# Patient Record
Sex: Female | Born: 1970
Health system: Southern US, Community
[De-identification: ages and names within clinical notes are randomized; demographics above are authoritative.]

## PROBLEM LIST (undated history)

## (undated) ENCOUNTER — Emergency Department (HOSPITAL_COMMUNITY): Admission: EM | Payer: 59 | Source: Home / Self Care

## (undated) DIAGNOSIS — I509 Heart failure, unspecified: Secondary | ICD-10-CM

## (undated) DIAGNOSIS — F17200 Nicotine dependence, unspecified, uncomplicated: Secondary | ICD-10-CM

## (undated) DIAGNOSIS — B019 Varicella without complication: Secondary | ICD-10-CM

## (undated) DIAGNOSIS — R0600 Dyspnea, unspecified: Secondary | ICD-10-CM

## (undated) DIAGNOSIS — J449 Chronic obstructive pulmonary disease, unspecified: Secondary | ICD-10-CM

## (undated) DIAGNOSIS — K9403 Colostomy malfunction: Secondary | ICD-10-CM

## (undated) DIAGNOSIS — F1110 Opioid abuse, uncomplicated: Secondary | ICD-10-CM

## (undated) DIAGNOSIS — I10 Essential (primary) hypertension: Secondary | ICD-10-CM

## (undated) DIAGNOSIS — G4730 Sleep apnea, unspecified: Secondary | ICD-10-CM

## (undated) DIAGNOSIS — K219 Gastro-esophageal reflux disease without esophagitis: Secondary | ICD-10-CM

## (undated) DIAGNOSIS — R0902 Hypoxemia: Secondary | ICD-10-CM

## (undated) HISTORY — PX: ABDOMINAL HYSTERECTOMY: SHX81

## (undated) HISTORY — PX: HERNIA REPAIR: SHX51

## (undated) HISTORY — PX: LUMBAR SPINE SURGERY: SHX701

## (undated) HISTORY — PX: OVARIAN CYST REMOVAL: SHX89

## (undated) HISTORY — PX: INCISIONAL HERNIA REPAIR: SHX193

## (undated) HISTORY — PX: OTHER SURGICAL HISTORY: SHX169

## (undated) HISTORY — PX: BACK SURGERY: SHX140

---

## 1997-10-10 ENCOUNTER — Ambulatory Visit (HOSPITAL_COMMUNITY): Admission: RE | Admit: 1997-10-10 | Discharge: 1997-10-10 | Payer: Self-pay | Admitting: Obstetrics and Gynecology

## 1997-10-11 ENCOUNTER — Inpatient Hospital Stay (HOSPITAL_COMMUNITY): Admission: AD | Admit: 1997-10-11 | Discharge: 1997-10-11 | Payer: Self-pay | Admitting: *Deleted

## 1998-01-04 ENCOUNTER — Emergency Department (HOSPITAL_COMMUNITY): Admission: EM | Admit: 1998-01-04 | Discharge: 1998-01-04 | Payer: Self-pay | Admitting: Emergency Medicine

## 1998-01-04 ENCOUNTER — Encounter: Payer: Self-pay | Admitting: Emergency Medicine

## 1998-05-13 ENCOUNTER — Emergency Department (HOSPITAL_COMMUNITY): Admission: EM | Admit: 1998-05-13 | Discharge: 1998-05-13 | Payer: Self-pay | Admitting: Emergency Medicine

## 1999-02-13 ENCOUNTER — Emergency Department (HOSPITAL_COMMUNITY): Admission: EM | Admit: 1999-02-13 | Discharge: 1999-02-13 | Payer: Self-pay | Admitting: Emergency Medicine

## 1999-07-27 ENCOUNTER — Emergency Department (HOSPITAL_COMMUNITY): Admission: EM | Admit: 1999-07-27 | Discharge: 1999-07-27 | Payer: Self-pay | Admitting: Emergency Medicine

## 1999-07-27 ENCOUNTER — Encounter: Payer: Self-pay | Admitting: Emergency Medicine

## 2000-04-22 ENCOUNTER — Encounter: Payer: Self-pay | Admitting: Obstetrics and Gynecology

## 2000-04-22 ENCOUNTER — Ambulatory Visit (HOSPITAL_COMMUNITY): Admission: RE | Admit: 2000-04-22 | Discharge: 2000-04-22 | Payer: Self-pay | Admitting: Obstetrics and Gynecology

## 2000-05-02 ENCOUNTER — Encounter: Payer: Self-pay | Admitting: General Surgery

## 2000-05-02 ENCOUNTER — Encounter (INDEPENDENT_AMBULATORY_CARE_PROVIDER_SITE_OTHER): Payer: Self-pay | Admitting: Specialist

## 2000-05-03 ENCOUNTER — Inpatient Hospital Stay (HOSPITAL_COMMUNITY): Admission: RE | Admit: 2000-05-03 | Discharge: 2000-05-04 | Payer: Self-pay | Admitting: General Surgery

## 2000-10-06 ENCOUNTER — Emergency Department (HOSPITAL_COMMUNITY): Admission: EM | Admit: 2000-10-06 | Discharge: 2000-10-06 | Payer: Self-pay | Admitting: Emergency Medicine

## 2001-06-23 ENCOUNTER — Encounter: Payer: Self-pay | Admitting: Emergency Medicine

## 2001-06-23 ENCOUNTER — Encounter: Payer: Self-pay | Admitting: *Deleted

## 2001-06-23 ENCOUNTER — Emergency Department (HOSPITAL_COMMUNITY): Admission: EM | Admit: 2001-06-23 | Discharge: 2001-06-23 | Payer: Self-pay | Admitting: Emergency Medicine

## 2002-05-30 ENCOUNTER — Other Ambulatory Visit: Admission: RE | Admit: 2002-05-30 | Discharge: 2002-05-30 | Payer: Self-pay | Admitting: Obstetrics and Gynecology

## 2002-12-11 ENCOUNTER — Ambulatory Visit (HOSPITAL_COMMUNITY): Admission: RE | Admit: 2002-12-11 | Discharge: 2002-12-11 | Payer: Self-pay | Admitting: Family Medicine

## 2002-12-11 ENCOUNTER — Emergency Department (HOSPITAL_COMMUNITY): Admission: AD | Admit: 2002-12-11 | Discharge: 2002-12-11 | Payer: Self-pay | Admitting: Family Medicine

## 2003-11-14 ENCOUNTER — Emergency Department (HOSPITAL_COMMUNITY): Admission: EM | Admit: 2003-11-14 | Discharge: 2003-11-14 | Payer: Self-pay | Admitting: Emergency Medicine

## 2003-11-21 ENCOUNTER — Ambulatory Visit (HOSPITAL_COMMUNITY): Admission: RE | Admit: 2003-11-21 | Discharge: 2003-11-21 | Payer: Self-pay | Admitting: Emergency Medicine

## 2005-06-17 ENCOUNTER — Encounter: Admission: RE | Admit: 2005-06-17 | Discharge: 2005-06-17 | Payer: Self-pay | Admitting: Family Medicine

## 2005-11-05 ENCOUNTER — Emergency Department (HOSPITAL_COMMUNITY): Admission: EM | Admit: 2005-11-05 | Discharge: 2005-11-06 | Payer: Self-pay | Admitting: Emergency Medicine

## 2005-11-11 ENCOUNTER — Emergency Department (HOSPITAL_COMMUNITY): Admission: EM | Admit: 2005-11-11 | Discharge: 2005-11-11 | Payer: Self-pay | Admitting: Emergency Medicine

## 2005-12-08 ENCOUNTER — Other Ambulatory Visit: Payer: Self-pay

## 2005-12-14 ENCOUNTER — Inpatient Hospital Stay: Payer: Self-pay | Admitting: Unknown Physician Specialty

## 2005-12-20 ENCOUNTER — Inpatient Hospital Stay: Payer: Self-pay | Admitting: Obstetrics & Gynecology

## 2006-01-19 ENCOUNTER — Inpatient Hospital Stay: Payer: Self-pay | Admitting: Urology

## 2006-07-25 ENCOUNTER — Ambulatory Visit: Payer: Self-pay | Admitting: Gastroenterology

## 2007-06-05 ENCOUNTER — Ambulatory Visit (HOSPITAL_COMMUNITY): Admission: RE | Admit: 2007-06-05 | Discharge: 2007-06-05 | Payer: Self-pay | Admitting: Pulmonary Disease

## 2009-02-12 ENCOUNTER — Inpatient Hospital Stay: Payer: Self-pay | Admitting: Internal Medicine

## 2009-02-12 ENCOUNTER — Ambulatory Visit: Payer: Self-pay | Admitting: Internal Medicine

## 2009-09-03 ENCOUNTER — Emergency Department: Payer: Self-pay | Admitting: Emergency Medicine

## 2010-01-18 HISTORY — PX: LAPAROSCOPIC CHOLECYSTECTOMY: SUR755

## 2010-05-13 ENCOUNTER — Ambulatory Visit: Payer: Self-pay | Admitting: General Surgery

## 2010-06-01 ENCOUNTER — Ambulatory Visit: Payer: Self-pay | Admitting: General Surgery

## 2010-06-03 ENCOUNTER — Inpatient Hospital Stay: Payer: Self-pay | Admitting: General Surgery

## 2010-06-05 NOTE — Op Note (Signed)
[facility]  Patient:    Misty Green, Misty Green                    MRN: [id number] Proc. Date: 05/02/00 Adm. Date:  [id number] Disc. Date: [id number] Attending:  Autumn Messing S                           Operative Report  PREOPERATIVE DIAGNOSIS:   Symptomatic cholelithiasis.  POSTOPERATIVE DIAGNOSIS:  Symptomatic cholelithiasis.  OPERATION:  Laparoscopic cholecystectomy.  SURGEON:  Sammuel Hines. Daiva Nakayama, M.D.  ASSISTANT:  Ward Givens, M.D.  ANESTHESIA:  General endotracheal.  DESCRIPTION OF PROCEDURE:  After informed consent was obtained, the patient was brought to the operating room and placed in the supine position on the operating table.  After adequate induction of general anesthesia, the patients abdomen was prepped with Betadine and draped in the usual sterile fashion.  A small transverse supraumbilical incision was made with the 15 blade knife after infiltrating this area with 0.25% Marcaine.  This incision was carried down through the subcutaneous tissue bluntly using the Kelly clamp and [employer] retractors until the linea of alba was identified.  The linea alba was incised with the 15 blade knife and each side was grasped with Kocher clamps and elevated anteriorly.  The preperitoneal space was prepared bluntly until the peritoneum was opened and access was gained through the abdominal cavity.  A finger was inserted through this hole to palpate the abdominal wall.  No apparent adhesions were identified.  A 0 Vicryl pursestring stitch was then placed in the fascia around this hole.  A Hasson cannula was placed through this hole into the abdominal cavity and anchored in place with the previously placed Vicryl pursestring stitch.  The abdomen was insufflated with carbon dioxide and the laparoscope was inserted through the Hasson cannula. The liver edge and the gallbladder were readily identified.  An upper midline small transverse incision was  made after infiltrating this area with 0.25% Marcaine and a 10 mm port was placed through this incision bluntly into the abdominal cavity under direct vision.  Two places were chosen laterally on the right side of the abdomen below the costal margin for placement of the 5 mm ports, and these areas were infiltrated with 0.25% Marcaine.  Small incisions were made with the 15 blade knife and the 5 mm ports were placed bluntly through these incisions into the abdominal cavity under direct vision.  A blunt grasper was placed through the lateral most 5 mm port and used to grasp the dome of the gallbladder and elevate it anteriorly and superiorly.  Another 5 mm port was placed through the other 5 mm port and used to retract on the body and neck of the gallbladder.  A [place] suture was placed through midline port and the peritoneal reflection overlying the neck of the gallbladder was opened with the Bovie electrocautery as well as blunt dissection.  Blunt dissection was carried down in this area until the gallbladder neck and cystic neck junction was readily identified.  This junction was dissected circumferentially and care was taken to keep the common duct medial to the dissection.  Once the gallbladder neck and cystic duct junction had been identified and cleared, three clips were placed proximally and one distally in the cystic duct and the cystic duct was divided between the two.  The cystic artery was readily identified posterior to this and again it  was dissected in a circumferential manner with the [place].  Two clips were placed proximally and one distally on the cystic artery and the artery was divided between the two.  The laparoscopic hook cautery was then used to separate the gallbladder from the liver bed.  Prior to completely removing the gallbladder from the liver bed the liver bed was inspected and several small bleeding points were coagulated with cautery.  The  gallbladder was then removed the rest of the way from the liver bed with the hook electrocautery.  The laparoscope was then removed to the upper midline port and the endoscopic bag was placed through the Hasson cannula.  The gallbladder was placed within the laparoscopic bag under direct vision, and the bag was closed.  The bag was then removed with Hasson cannula through the supraumbilical port and the Hasson cannula was then replaced.  The abdomen was then inspected and the liver bed was evaluated and found to be hemostatic. The abdomen was then irrigated with copious amounts of saline until the effluent was clear.  The ports were then removed under direct vision and all were hemostatic.  The fascia of the supraumbilical port was closed with the previously placed Vicryl pursestring stitch.  The skin incisions were closed with 4-0 Monocryl subcuticular stitches.  Benzoin and Steri-Strips were applied.  The patient tolerated the procedure well.  At the end of the case, all needle sponge and instrument counts were correct.  The patient was awakened and taken to the recovery room in stable condition. DD:  05/06/00 TD:  05/08/00 Job: [id number]

## 2011-05-24 ENCOUNTER — Emergency Department: Payer: Self-pay | Admitting: Emergency Medicine

## 2011-06-25 ENCOUNTER — Other Ambulatory Visit (HOSPITAL_COMMUNITY): Payer: Self-pay | Admitting: Pulmonary Disease

## 2011-06-25 DIAGNOSIS — M545 Low back pain: Secondary | ICD-10-CM

## 2011-06-28 ENCOUNTER — Ambulatory Visit (HOSPITAL_COMMUNITY)
Admission: RE | Admit: 2011-06-28 | Discharge: 2011-06-28 | Disposition: A | Discharge status: Home / Self Care | Payer: 59 | Source: Ambulatory Visit | Attending: Pulmonary Disease | Admitting: Pulmonary Disease

## 2011-06-28 DIAGNOSIS — M545 Low back pain, unspecified: Secondary | ICD-10-CM | POA: Insufficient documentation

## 2011-06-28 DIAGNOSIS — M79609 Pain in unspecified limb: Secondary | ICD-10-CM | POA: Insufficient documentation

## 2011-06-28 DIAGNOSIS — M51379 Other intervertebral disc degeneration, lumbosacral region without mention of lumbar back pain or lower extremity pain: Secondary | ICD-10-CM | POA: Insufficient documentation

## 2011-06-28 DIAGNOSIS — M5126 Other intervertebral disc displacement, lumbar region: Secondary | ICD-10-CM | POA: Insufficient documentation

## 2011-06-28 DIAGNOSIS — M5137 Other intervertebral disc degeneration, lumbosacral region: Secondary | ICD-10-CM | POA: Insufficient documentation

## 2011-12-03 ENCOUNTER — Other Ambulatory Visit (HOSPITAL_COMMUNITY): Payer: Self-pay | Admitting: Pulmonary Disease

## 2011-12-03 DIAGNOSIS — J449 Chronic obstructive pulmonary disease, unspecified: Secondary | ICD-10-CM

## 2011-12-10 ENCOUNTER — Ambulatory Visit (HOSPITAL_COMMUNITY): Payer: 59

## 2011-12-22 ENCOUNTER — Ambulatory Visit (HOSPITAL_COMMUNITY)
Admission: RE | Admit: 2011-12-22 | Discharge: 2011-12-22 | Disposition: A | Discharge status: Home / Self Care | Payer: 59 | Source: Ambulatory Visit | Attending: Pulmonary Disease | Admitting: Pulmonary Disease

## 2011-12-22 ENCOUNTER — Other Ambulatory Visit (HOSPITAL_COMMUNITY): Payer: Self-pay | Admitting: Pulmonary Disease

## 2011-12-22 DIAGNOSIS — R0602 Shortness of breath: Secondary | ICD-10-CM

## 2011-12-22 DIAGNOSIS — J449 Chronic obstructive pulmonary disease, unspecified: Secondary | ICD-10-CM | POA: Insufficient documentation

## 2011-12-22 DIAGNOSIS — J4489 Other specified chronic obstructive pulmonary disease: Secondary | ICD-10-CM | POA: Insufficient documentation

## 2011-12-23 ENCOUNTER — Encounter (HOSPITAL_COMMUNITY): Payer: 59

## 2011-12-27 ENCOUNTER — Ambulatory Visit (HOSPITAL_COMMUNITY): Payer: 59

## 2011-12-28 ENCOUNTER — Ambulatory Visit (HOSPITAL_COMMUNITY)
Admission: RE | Admit: 2011-12-28 | Discharge: 2011-12-28 | Disposition: A | Discharge status: Home / Self Care | Payer: 59 | Source: Ambulatory Visit | Attending: Pulmonary Disease | Admitting: Pulmonary Disease

## 2011-12-28 DIAGNOSIS — J449 Chronic obstructive pulmonary disease, unspecified: Secondary | ICD-10-CM | POA: Insufficient documentation

## 2011-12-28 DIAGNOSIS — J4489 Other specified chronic obstructive pulmonary disease: Secondary | ICD-10-CM | POA: Insufficient documentation

## 2011-12-28 MED ORDER — ALBUTEROL SULFATE (5 MG/ML) 0.5% IN NEBU
2.5000 mg | INHALATION_SOLUTION | Freq: Once | RESPIRATORY_TRACT | Status: AC
  Administered 2011-12-28: 2.5 mg via RESPIRATORY_TRACT

## 2011-12-30 NOTE — Procedures (Signed)
NAME:  Misty Green, Misty Green             ACCOUNT NO.:  [id number]  MEDICAL RECORD NO.:  [id number]  LOCATION:  RESP                          FACILITY:  [facility]  PHYSICIAN:  Jantz Main L. Luan Pulling, M.D.DATE OF BIRTH:  [birth year]  DATE OF PROCEDURE: DATE OF DISCHARGE:  12/28/2011                           PULMONARY FUNCTION TEST   REASON FOR PULMONARY FUNCTION TESTING:  Chronic obstructive pulmonary disease.  1. Spirometry shows a moderate-to-severe ventilatory defect with     airflow obstruction. 2. Lung volumes show air trapping. 3. DLCO is normal. 4. Airway resistance is exceptionally high. 5. There is significant bronchodilator improvement. 6. This study is consistent with COPD.     Venba Zenner L. Luan Pulling, M.D.     ELH/MEDQ  D:  12/29/2011  T:  12/30/2011  Job:  [id number]

## 2012-02-03 LAB — PULMONARY FUNCTION TEST

## 2012-04-08 ENCOUNTER — Emergency Department: Payer: Self-pay | Admitting: Emergency Medicine

## 2012-04-10 ENCOUNTER — Emergency Department: Payer: Self-pay | Admitting: Emergency Medicine

## 2012-06-30 ENCOUNTER — Other Ambulatory Visit: Payer: Self-pay | Admitting: Neurosurgery

## 2012-06-30 DIAGNOSIS — M5126 Other intervertebral disc displacement, lumbar region: Secondary | ICD-10-CM

## 2012-07-05 ENCOUNTER — Other Ambulatory Visit: Payer: 59

## 2012-07-06 ENCOUNTER — Ambulatory Visit
Admission: RE | Admit: 2012-07-06 | Discharge: 2012-07-06 | Disposition: A | Discharge status: Home / Self Care | Payer: 59 | Source: Ambulatory Visit | Attending: Neurosurgery | Admitting: Neurosurgery

## 2012-07-06 DIAGNOSIS — M5126 Other intervertebral disc displacement, lumbar region: Secondary | ICD-10-CM

## 2012-08-20 ENCOUNTER — Emergency Department: Payer: Self-pay | Admitting: Emergency Medicine

## 2013-01-23 ENCOUNTER — Ambulatory Visit (HOSPITAL_COMMUNITY)
Admission: RE | Admit: 2013-01-23 | Discharge: 2013-01-23 | Disposition: A | Discharge status: Home / Self Care | Payer: 59 | Source: Ambulatory Visit | Attending: Pulmonary Disease | Admitting: Pulmonary Disease

## 2013-01-23 ENCOUNTER — Other Ambulatory Visit (HOSPITAL_COMMUNITY): Payer: Self-pay | Admitting: Pulmonary Disease

## 2013-01-23 DIAGNOSIS — R059 Cough, unspecified: Secondary | ICD-10-CM | POA: Insufficient documentation

## 2013-01-23 DIAGNOSIS — R0609 Other forms of dyspnea: Secondary | ICD-10-CM | POA: Insufficient documentation

## 2013-01-23 DIAGNOSIS — R05 Cough: Secondary | ICD-10-CM | POA: Insufficient documentation

## 2013-01-23 DIAGNOSIS — R509 Fever, unspecified: Secondary | ICD-10-CM | POA: Insufficient documentation

## 2013-01-23 DIAGNOSIS — Z87891 Personal history of nicotine dependence: Secondary | ICD-10-CM | POA: Insufficient documentation

## 2013-01-23 DIAGNOSIS — R0602 Shortness of breath: Secondary | ICD-10-CM

## 2013-01-23 DIAGNOSIS — R0989 Other specified symptoms and signs involving the circulatory and respiratory systems: Principal | ICD-10-CM | POA: Insufficient documentation

## 2013-12-04 ENCOUNTER — Other Ambulatory Visit: Payer: Self-pay | Admitting: Neurosurgery

## 2013-12-04 DIAGNOSIS — M5106 Intervertebral disc disorders with myelopathy, lumbar region: Secondary | ICD-10-CM

## 2014-01-21 ENCOUNTER — Other Ambulatory Visit: Payer: 59

## 2014-01-25 ENCOUNTER — Ambulatory Visit
Admission: RE | Admit: 2014-01-25 | Discharge: 2014-01-25 | Disposition: A | Discharge status: Home / Self Care | Payer: 59 | Source: Ambulatory Visit | Attending: Neurosurgery | Admitting: Neurosurgery

## 2014-01-25 DIAGNOSIS — M5106 Intervertebral disc disorders with myelopathy, lumbar region: Secondary | ICD-10-CM

## 2014-04-05 ENCOUNTER — Other Ambulatory Visit (HOSPITAL_COMMUNITY): Payer: Self-pay | Admitting: Pulmonary Disease

## 2014-04-05 DIAGNOSIS — M545 Low back pain: Secondary | ICD-10-CM

## 2014-04-16 ENCOUNTER — Ambulatory Visit (HOSPITAL_COMMUNITY)
Admission: RE | Admit: 2014-04-16 | Discharge: 2014-04-16 | Disposition: A | Discharge status: Home / Self Care | Payer: 59 | Source: Ambulatory Visit | Attending: Pulmonary Disease | Admitting: Pulmonary Disease

## 2014-04-16 DIAGNOSIS — M545 Low back pain: Secondary | ICD-10-CM

## 2015-08-21 ENCOUNTER — Encounter: Payer: Self-pay | Admitting: Emergency Medicine

## 2015-08-21 ENCOUNTER — Emergency Department
Admission: EM | Admit: 2015-08-21 | Discharge: 2015-08-21 | Disposition: A | Discharge status: Home / Self Care | Payer: 59 | Attending: Emergency Medicine | Admitting: Emergency Medicine

## 2015-08-21 ENCOUNTER — Emergency Department: Payer: 59

## 2015-08-21 DIAGNOSIS — S6991XA Unspecified injury of right wrist, hand and finger(s), initial encounter: Secondary | ICD-10-CM | POA: Diagnosis present

## 2015-08-21 DIAGNOSIS — Y929 Unspecified place or not applicable: Secondary | ICD-10-CM | POA: Diagnosis not present

## 2015-08-21 DIAGNOSIS — W010XXA Fall on same level from slipping, tripping and stumbling without subsequent striking against object, initial encounter: Secondary | ICD-10-CM | POA: Insufficient documentation

## 2015-08-21 DIAGNOSIS — S62102A Fracture of unspecified carpal bone, left wrist, initial encounter for closed fracture: Secondary | ICD-10-CM

## 2015-08-21 DIAGNOSIS — M25572 Pain in left ankle and joints of left foot: Secondary | ICD-10-CM | POA: Insufficient documentation

## 2015-08-21 DIAGNOSIS — Y9301 Activity, walking, marching and hiking: Secondary | ICD-10-CM | POA: Diagnosis not present

## 2015-08-21 DIAGNOSIS — F17210 Nicotine dependence, cigarettes, uncomplicated: Secondary | ICD-10-CM | POA: Diagnosis not present

## 2015-08-21 DIAGNOSIS — S52511A Displaced fracture of right radial styloid process, initial encounter for closed fracture: Secondary | ICD-10-CM | POA: Insufficient documentation

## 2015-08-21 DIAGNOSIS — Y999 Unspecified external cause status: Secondary | ICD-10-CM | POA: Diagnosis not present

## 2015-08-21 DIAGNOSIS — J449 Chronic obstructive pulmonary disease, unspecified: Secondary | ICD-10-CM | POA: Insufficient documentation

## 2015-08-21 HISTORY — DX: Chronic obstructive pulmonary disease, unspecified: J44.9

## 2015-08-21 MED ORDER — HYDROMORPHONE HCL 1 MG/ML IJ SOLN
1.0000 mg | Freq: Once | INTRAMUSCULAR | Status: AC
  Administered 2015-08-21: 1 mg via INTRAVENOUS
  Filled 2015-08-21: qty 1

## 2015-08-21 MED ORDER — PROMETHAZINE HCL 25 MG PO TABS
25.0000 mg | ORAL_TABLET | Freq: Four times a day (QID) | ORAL | 0 refills | Status: DC | PRN
Start: 2015-08-21 — End: 2017-12-13

## 2015-08-21 MED ORDER — BUPIVACAINE HCL (PF) 0.5 % IJ SOLN
INTRAMUSCULAR | Status: AC
  Administered 2015-08-21: 150 mg
  Filled 2015-08-21: qty 30

## 2015-08-21 MED ORDER — HYDROMORPHONE HCL 1 MG/ML IJ SOLN
1.0000 mg | Freq: Once | INTRAMUSCULAR | Status: AC
Start: 2015-08-21 — End: 2015-08-21
  Administered 2015-08-21: 1 mg via INTRAMUSCULAR
  Filled 2015-08-21: qty 1

## 2015-08-21 MED ORDER — LIDOCAINE HCL (PF) 1 % IJ SOLN
INTRAMUSCULAR | Status: AC
  Administered 2015-08-21: 5 mL
  Filled 2015-08-21: qty 5

## 2015-08-21 MED ORDER — OXYCODONE-ACETAMINOPHEN 10-325 MG PO TABS: 1.0000 | ORAL_TABLET | Freq: Four times a day (QID) | ORAL | 0 refills | Status: DC | PRN

## 2015-08-21 MED ORDER — ONDANSETRON 4 MG PO TBDP
4.0000 mg | ORAL_TABLET | Freq: Once | ORAL | Status: AC
  Administered 2015-08-21: 4 mg via ORAL
  Filled 2015-08-21: qty 1

## 2015-08-21 MED ORDER — LIDOCAINE-EPINEPHRINE (PF) 1 %-1:200000 IJ SOLN
INTRAMUSCULAR | Status: AC
  Administered 2015-08-21: 30 mL
  Filled 2015-08-21: qty 30

## 2015-08-21 NOTE — Consult Note (Signed)
ORTHOPAEDIC CONSULTATION  REQUESTING PHYSICIAN: Rudene Re, MD  Chief Complaint:   Right wrist pain.  History of Present Illness: Misty Green is a 45 y.o. female with a history of COPD who lives independently with her husband. Apparently, her dogs woke up and wanted to be walked around 3:00 this morning. As she took the dogs out, she tripped and fell onto her outstretched right hand. She noted significant pain in the wrist, prompting her to present to the emergency room for further evaluation and treatment. X-rays in the emergency room demonstrated a dorsally impacted fracture. Initially, she was to be splinted and sent home for follow-up with orthopedics. However, because of significant pain, orthopedic consultation was arranged. The patient denies any numbness or paresthesias to her fingers and denies any prior injury to the wrist. She denies any loss of consciousness, but does note that she may have sprained her left ankle as a result of the fall. X-rays of her left ankle were unremarkable in the emergency room.  Past Medical History:  Diagnosis Date  . COPD (chronic obstructive pulmonary disease) ([place])    Past Surgical History:  Procedure Laterality Date  . ABDOMINAL HYSTERECTOMY    . BACK SURGERY    . cesction    . HERNIA REPAIR     Social History   Social History  . Marital status: Married    Spouse name: N/A  . Number of children: N/A  . Years of education: N/A   Social History Main Topics  . Smoking status: Current Every Day Smoker    Packs/day: 1.00    Types: Cigarettes  . Smokeless tobacco: Never Used  . Alcohol use No  . Drug use: No  . Sexual activity: Not Asked   Other Topics Concern  . None   Social History Narrative  . None   No family history on file. Allergies no known allergies Prior to Admission medications   Medication Sig Start Date End Date Taking? Authorizing Provider   oxyCODONE-acetaminophen (PERCOCET) 10-325 MG tablet Take 1 tablet by mouth every 6 (six) hours as needed for pain. 08/21/15 08/20/16  Pierce Crane Beers, PA-C  promethazine (PHENERGAN) 25 MG tablet Take 1 tablet (25 mg total) by mouth every 6 (six) hours as needed for nausea or vomiting. 08/21/15   Arlyss Repress, PA-C   Dg Wrist Complete Right  Result Date: 08/21/2015 CLINICAL DATA:  Right wrist pain and swelling after falling over a railroad tie last night. EXAM: RIGHT WRIST - COMPLETE 3+ VIEW COMPARISON:  None. FINDINGS: Comminuted and mildly impacted transverse fracture of the distal radial metaphysis with dorsal angulation and mild dorsal displacement of the distal fragment. There is also a fracture through the base of the ulnar styloid with mild radial displacement of the distal fragment. Diffuse soft tissue swelling. IMPRESSION: Distal radius and ulnar styloid fractures, as described above. Electronically Signed   By: Claudie Revering M.D.   On: 08/21/2015 17:39   Dg Ankle Complete Left  Result Date: 08/21/2015 CLINICAL DATA:  [place] last night over a railroad tie, LEFT ankle pain EXAM: LEFT ANKLE COMPLETE - 3+ VIEW COMPARISON:  None FINDINGS: Osseous demineralization. Soft tissue swelling greatest laterally. Ankle mortise intact. Non fused ossicle at tip of lateral malleolus, appears corticated/old. No definite acute fracture, dislocation, or bone destruction. IMPRESSION: No definite acute osseous abnormalities. Electronically Signed   By: Lavonia Dana M.D.   On: 08/21/2015 17:40    Positive ROS: All other systems have been reviewed and were  otherwise negative with the exception of those mentioned in the HPI and as above.  Physical Exam: General:  Alert, no acute distress Psychiatric:  Patient is competent for consent with normal mood and affect   Cardiovascular:  No pedal edema Respiratory:  No wheezing, non-labored breathing GI:  Abdomen is soft and non-tender Skin:  No lesions in the area of chief  complaint Neurologic:  Sensation intact distally Lymphatic:  No axillary or cervical lymphadenopathy  Orthopedic Exam:  Orthopedic examination is limited to the right wrist and hand. There is a noticeable deformity of the distal radius with moderate swelling. She has moderate to severe pain to palpation in this area, as well as with any attempted active or passive motion of the wrist. She is able to actively flex and extend all digits, although motion is limited secondary to pain and apprehension. She is neurovascular intact to all digits.  X-rays:  AP, lateral, and oblique views of the right wrist are available for review. These films demonstrate an impacted and mildly dorsally angulated distal radius fracture with intra-articular extension and an ulnar styloid fracture. No lytic lesions or other bony abnormalities are identified.  Assessment: Impacted and dorsally angulated right distal radius fracture with an ulnar styloid fracture.  Plan: The treatment options were discussed with the patient and her husband. Given the age of the patient and activity level, that patient would like to consider surgical intervention to include open reduction and internal fixation of the fracture with plate fixation. This will be arranged for next week. Meanwhile, in order to help with her pain, she is offered and accepts a hematoma block/wrist block. This is performed sterilely using 10 cc of 0.5% Sensorcaine and 10 cc of 1% lidocaine with epinephrine. 10 cc of this solution was injected into the wrist joint and the other 10 cc injected into the fracture hematoma. The patient tolerated the procedure well and notes significant improvement in her pain. A sugar tong splint will be applied by the ER provider.  The patient is advised to keep her hand elevated above heart level to keep the splint dry and intact. She may take ibuprofen and/or Tylenol as needed for pain. She is to be given a prescription by the ER provider  for Percocet to take for more severe pain. She is to call our office in the morning to arrange a follow-up appointment for next Monday with either myself or with Cameron Proud, my physician assistant.  Thank you for ask me to participate in the care of this most pleasant unfortunate woman. I will be happy to keep you abreast of her progress.   Pascal Lux, MD  Beeper #:  [phone]  08/21/2015 9:04 PM

## 2015-08-21 NOTE — ED Notes (Addendum)
Pt O2 saturation low, placed on 2 L of oxygen.

## 2015-08-21 NOTE — ED Notes (Signed)
Patient states took Oxycodone 15 mg one hour PTA.

## 2015-08-21 NOTE — ED Provider Notes (Signed)
[facility] Emergency Department Provider Note  ____________________________________________  Time seen: Approximately 5:35 PM  I have reviewed the triage vital signs and the nursing notes.   HISTORY  Chief Complaint Fall    HPI Misty Green is a 45 y.o. female who presents for evaluation of right wrist and left ankle pain status post fall. Patient states she was walking her dogs at 3 this morning approximately 16 hours ago when she tripped and fell landing on an outstretched arm. The pain is 10 over 10 at this time.   Past Medical History:  Diagnosis Date  . COPD (chronic obstructive pulmonary disease) ([facility])     There are no active problems to display for this patient.   Past Surgical History:  Procedure Laterality Date  . ABDOMINAL HYSTERECTOMY    . BACK SURGERY    . cesction    . HERNIA REPAIR      Prior to Admission medications   Medication Sig Start Date End Date Taking? Authorizing Provider  oxyCODONE-acetaminophen (PERCOCET) 10-325 MG tablet Take 1 tablet by mouth every 6 (six) hours as needed for pain. 08/21/15 08/20/16  Pierce Crane Jaden Batchelder, PA-C  promethazine (PHENERGAN) 25 MG tablet Take 1 tablet (25 mg total) by mouth every 6 (six) hours as needed for nausea or vomiting. 08/21/15   Arlyss Repress, PA-C    Allergies Review of patient's allergies indicates no known allergies.  No family history on file.  Social History Social History  Substance Use Topics  . Smoking status: Current Every Day Smoker    Packs/day: 1.00    Types: Cigarettes  . Smokeless tobacco: Never Used  . Alcohol use No    Review of Systems Constitutional: No fever/chills Eyes: No visual changes. ENT: No sore throat. Cardiovascular: Denies chest pain. Respiratory: Denies shortness of breath. Gastrointestinal: No abdominal pain.  No nausea, no vomiting.  No diarrhea.  No constipation. Genitourinary: Negative for dysuria. Musculoskeletal: Positive for right  wrist pain. Positive for left ankle pain. Skin: Negative for rash. Neurological: Negative for headaches, focal weakness or numbness.  10-point ROS otherwise negative.  ____________________________________________   PHYSICAL EXAM:  VITAL SIGNS: ED Triage Vitals  Enc Vitals Group     BP 08/21/15 1658 133/71     Pulse Rate 08/21/15 1658 80     Resp 08/21/15 1658 16     Temp 08/21/15 1658 97.8 F (36.6 C)     Temp Source 08/21/15 1658 Oral     SpO2 08/21/15 1658 (!) 83 %     Weight 08/21/15 1658 155 lb (70.3 kg)     Height 08/21/15 1658 5\' 4"  (1.626 m)     Head Circumference --      Peak Flow --      Pain Score 08/21/15 1700 10     Pain Loc --      Pain Edu? --      Excl. in [place]? --     Constitutional: Alert and oriented. Well appearing and in no acute distress. Neck: No stridor.   Cardiovascular: Normal rate, regular rhythm. Grossly normal heart sounds.  Good peripheral circulation. Respiratory: Normal respiratory effort.  No retractions. Lungs CTAB. Musculoskeletal: Left ankle limited range of motion. Increased pain with flexion. Distally neurovascularly intact. Positive lateral and medial swelling noted. Right wrist with limited range of motion positive capillary refill. Unable to flex wrist obvious deformity noted. Capillary refill positive. Neurologic:  Normal speech and language. No gross focal neurologic deficits are appreciated. No  gait instability. Skin:  Skin is warm, dry and intact. No rash noted. Psychiatric: Mood and affect are normal. Speech and behavior are normal.  ____________________________________________   LABS (all labs ordered are listed, but only abnormal results are displayed)  Labs Reviewed - No data to display ____________________________________________  EKG   ____________________________________________  RADIOLOGY  FINDINGS: Comminuted and mildly impacted transverse fracture of the distal radial metaphysis with dorsal angulation and  mild dorsal displacement of the distal fragment. There is also a fracture through the base of the ulnar styloid with mild radial displacement of the distal fragment. Diffuse soft tissue swelling.  IMPRESSION: Distal radius and ulnar styloid fractures, as described above. ____________________________________________   PROCEDURES  Procedure(s) performed: None  Critical Care performed: No  ____________________________________________   INITIAL IMPRESSION / ASSESSMENT AND PLAN / ED COURSE  Pertinent labs & imaging results that were available during my care of the patient were reviewed by me and considered in my medical decision making (see chart for details).  Comminuted and impacted left wrist fracture.  Clinical Course   Is given 2 mg Dilaudid still increased pain. Dr. Roland Rack placed a heme block, patient was placed in sugar tong splint and instructed to follow-up with him next week. Rx given for Percocet 10/325 #30. ____________________________________________   FINAL CLINICAL IMPRESSION(S) / ED DIAGNOSES  Final diagnoses:  Left wrist fracture, closed, initial encounter     This chart was dictated using voice recognition software/Dragon. Despite best efforts to proofread, errors can occur which can change the meaning. Any change was purely unintentional.    Arlyss Repress, PA-C 08/21/15 [place], MD 08/21/15 [phone]

## 2015-08-21 NOTE — ED Triage Notes (Signed)
Fell, twisted left ankle and injured right wrist while walking dogs.

## 2015-08-21 NOTE — Discharge Instructions (Signed)
Keep splint dry and intact. Keep hand elevated above heart level. Apply ice to affected area frequently. May take extra strength Tylenol and/or ibuprofen as needed for pain. A prescription for Percocet has been provided for more severe pain. Return for follow-up on Monday with either Dr. Roland Rack or Cameron Proud, PA-C.  Call [phone] on Friday morning to arrange this appointment.

## 2015-08-25 ENCOUNTER — Encounter: Payer: Self-pay | Admitting: Anesthesiology

## 2015-08-25 ENCOUNTER — Ambulatory Visit: Payer: 59 | Admitting: Anesthesiology

## 2015-08-25 ENCOUNTER — Ambulatory Visit
Admission: AD | Admit: 2015-08-25 | Discharge: 2015-08-25 | Disposition: A | Discharge status: Home / Self Care | Payer: 59 | Source: Ambulatory Visit | Attending: Surgery | Admitting: Surgery

## 2015-08-25 ENCOUNTER — Encounter
Admission: AD | Disposition: A | Discharge status: Home / Self Care | Payer: Self-pay | Source: Ambulatory Visit | Attending: Surgery

## 2015-08-25 DIAGNOSIS — G4730 Sleep apnea, unspecified: Secondary | ICD-10-CM | POA: Diagnosis not present

## 2015-08-25 DIAGNOSIS — S52501A Unspecified fracture of the lower end of right radius, initial encounter for closed fracture: Secondary | ICD-10-CM | POA: Diagnosis present

## 2015-08-25 DIAGNOSIS — Y93K1 Activity, walking an animal: Secondary | ICD-10-CM | POA: Insufficient documentation

## 2015-08-25 DIAGNOSIS — Y9289 Other specified places as the place of occurrence of the external cause: Secondary | ICD-10-CM | POA: Diagnosis not present

## 2015-08-25 DIAGNOSIS — S52611A Displaced fracture of right ulna styloid process, initial encounter for closed fracture: Secondary | ICD-10-CM | POA: Insufficient documentation

## 2015-08-25 DIAGNOSIS — Z90710 Acquired absence of both cervix and uterus: Secondary | ICD-10-CM | POA: Insufficient documentation

## 2015-08-25 DIAGNOSIS — W010XXA Fall on same level from slipping, tripping and stumbling without subsequent striking against object, initial encounter: Secondary | ICD-10-CM | POA: Diagnosis not present

## 2015-08-25 DIAGNOSIS — S52614A Nondisplaced fracture of right ulna styloid process, initial encounter for closed fracture: Secondary | ICD-10-CM | POA: Insufficient documentation

## 2015-08-25 DIAGNOSIS — S52571A Other intraarticular fracture of lower end of right radius, initial encounter for closed fracture: Secondary | ICD-10-CM | POA: Insufficient documentation

## 2015-08-25 DIAGNOSIS — F17210 Nicotine dependence, cigarettes, uncomplicated: Secondary | ICD-10-CM | POA: Insufficient documentation

## 2015-08-25 DIAGNOSIS — J449 Chronic obstructive pulmonary disease, unspecified: Secondary | ICD-10-CM | POA: Diagnosis not present

## 2015-08-25 HISTORY — PX: ORIF WRIST FRACTURE: SHX2133

## 2015-08-25 SURGERY — OPEN REDUCTION INTERNAL FIXATION (ORIF) WRIST FRACTURE
Anesthesia: General | Laterality: Right

## 2015-08-25 MED ORDER — ONDANSETRON HCL 4 MG/2ML IJ SOLN
INTRAMUSCULAR | Status: DC | PRN
  Administered 2015-08-25: 4 mg via INTRAVENOUS

## 2015-08-25 MED ORDER — FENTANYL CITRATE (PF) 100 MCG/2ML IJ SOLN
INTRAMUSCULAR | Status: AC
  Filled 2015-08-25: qty 2

## 2015-08-25 MED ORDER — KETOROLAC TROMETHAMINE 30 MG/ML IJ SOLN
30.0000 mg | Freq: Once | INTRAMUSCULAR | Status: AC
  Administered 2015-08-25: 30 mg via INTRAVENOUS
  Filled 2015-08-25: qty 1

## 2015-08-25 MED ORDER — HYDROMORPHONE HCL 1 MG/ML IJ SOLN
INTRAMUSCULAR | Status: AC
  Filled 2015-08-25: qty 1

## 2015-08-25 MED ORDER — LACTATED RINGERS IV SOLN
INTRAVENOUS | Status: DC
  Administered 2015-08-25 (×2): via INTRAVENOUS

## 2015-08-25 MED ORDER — OXYCODONE HCL 5 MG PO TABS
ORAL_TABLET | ORAL | Status: AC
  Filled 2015-08-25: qty 1

## 2015-08-25 MED ORDER — DEXAMETHASONE SODIUM PHOSPHATE 10 MG/ML IJ SOLN
INTRAMUSCULAR | Status: DC | PRN
  Administered 2015-08-25: 10 mg via INTRAVENOUS

## 2015-08-25 MED ORDER — IPRATROPIUM-ALBUTEROL 0.5-2.5 (3) MG/3ML IN SOLN
3.0000 mL | Freq: Once | RESPIRATORY_TRACT | Status: AC
  Administered 2015-08-25: 3 mL via RESPIRATORY_TRACT

## 2015-08-25 MED ORDER — FENTANYL CITRATE (PF) 100 MCG/2ML IJ SOLN
INTRAMUSCULAR | Status: DC | PRN
  Administered 2015-08-25 (×3): 50 ug via INTRAVENOUS
  Administered 2015-08-25 (×2): 25 ug via INTRAVENOUS
  Administered 2015-08-25 (×3): 50 ug via INTRAVENOUS
  Administered 2015-08-25: 100 ug via INTRAVENOUS
  Administered 2015-08-25 (×2): 50 ug via INTRAVENOUS

## 2015-08-25 MED ORDER — MIDAZOLAM HCL 2 MG/2ML IJ SOLN
INTRAMUSCULAR | Status: DC | PRN
  Administered 2015-08-25: 2 mg via INTRAVENOUS

## 2015-08-25 MED ORDER — NEOMYCIN-POLYMYXIN B GU 40-200000 IR SOLN
Status: AC
  Filled 2015-08-25: qty 2

## 2015-08-25 MED ORDER — NEOMYCIN-POLYMYXIN B GU 40-200000 IR SOLN
Status: DC | PRN
  Administered 2015-08-25: 2 mL

## 2015-08-25 MED ORDER — KETOROLAC TROMETHAMINE 30 MG/ML IJ SOLN
INTRAMUSCULAR | Status: AC
  Filled 2015-08-25: qty 1

## 2015-08-25 MED ORDER — ACETAMINOPHEN 10 MG/ML IV SOLN
INTRAVENOUS | Status: DC | PRN
  Administered 2015-08-25: 1000 mg via INTRAVENOUS

## 2015-08-25 MED ORDER — BUPIVACAINE HCL (PF) 0.5 % IJ SOLN
INTRAMUSCULAR | Status: AC
  Filled 2015-08-25: qty 30

## 2015-08-25 MED ORDER — IPRATROPIUM-ALBUTEROL 0.5-2.5 (3) MG/3ML IN SOLN
RESPIRATORY_TRACT | Status: AC
  Administered 2015-08-25: 3 mL via RESPIRATORY_TRACT
  Filled 2015-08-25: qty 3

## 2015-08-25 MED ORDER — ESMOLOL HCL 100 MG/10ML IV SOLN
INTRAVENOUS | Status: DC | PRN
  Administered 2015-08-25: 10 mg via INTRAVENOUS

## 2015-08-25 MED ORDER — BUPIVACAINE HCL (PF) 0.5 % IJ SOLN
INTRAMUSCULAR | Status: DC | PRN
  Administered 2015-08-25: 10 mL

## 2015-08-25 MED ORDER — OXYCODONE HCL 5 MG/5ML PO SOLN: 5.0000 mg | Freq: Once | ORAL | Status: AC | PRN

## 2015-08-25 MED ORDER — FENTANYL CITRATE (PF) 100 MCG/2ML IJ SOLN
INTRAMUSCULAR | Status: AC
  Administered 2015-08-25: 25 ug via INTRAVENOUS
  Filled 2015-08-25: qty 2

## 2015-08-25 MED ORDER — FENTANYL CITRATE (PF) 100 MCG/2ML IJ SOLN
INTRAMUSCULAR | Status: AC
  Administered 2015-08-25: 100 ug via INTRAVENOUS
  Filled 2015-08-25: qty 2

## 2015-08-25 MED ORDER — FENTANYL CITRATE (PF) 100 MCG/2ML IJ SOLN
25.0000 ug | INTRAMUSCULAR | Status: DC | PRN
  Administered 2015-08-25 (×4): 25 ug via INTRAVENOUS
  Administered 2015-08-25: 50 ug via INTRAVENOUS

## 2015-08-25 MED ORDER — PROPOFOL 10 MG/ML IV BOLUS
INTRAVENOUS | Status: DC | PRN
  Administered 2015-08-25: 30 mg via INTRAVENOUS
  Administered 2015-08-25: 170 mg via INTRAVENOUS

## 2015-08-25 MED ORDER — CEFAZOLIN SODIUM-DEXTROSE 2-4 GM/100ML-% IV SOLN
2.0000 g | Freq: Once | INTRAVENOUS | Status: AC
  Administered 2015-08-25: 2 g via INTRAVENOUS

## 2015-08-25 MED ORDER — FENTANYL CITRATE (PF) 100 MCG/2ML IJ SOLN
100.0000 ug | Freq: Once | INTRAMUSCULAR | Status: AC
  Administered 2015-08-25: 100 ug via INTRAVENOUS

## 2015-08-25 MED ORDER — ACETAMINOPHEN 10 MG/ML IV SOLN
INTRAVENOUS | Status: AC
  Filled 2015-08-25: qty 100

## 2015-08-25 MED ORDER — CEFAZOLIN SODIUM-DEXTROSE 2-4 GM/100ML-% IV SOLN
INTRAVENOUS | Status: DC
Start: 2015-08-25 — End: 2015-08-25
  Filled 2015-08-25: qty 100

## 2015-08-25 MED ORDER — OXYCODONE HCL 15 MG PO TABS: 15.0000 mg | ORAL_TABLET | ORAL | 0 refills | Status: DC | PRN

## 2015-08-25 MED ORDER — OXYCODONE HCL 5 MG PO TABS
5.0000 mg | ORAL_TABLET | Freq: Once | ORAL | Status: AC | PRN
  Administered 2015-08-25: 5 mg via ORAL

## 2015-08-25 MED ORDER — HYDROMORPHONE HCL 1 MG/ML IJ SOLN
0.2500 mg | INTRAMUSCULAR | Status: DC | PRN
  Administered 2015-08-25 (×4): 0.5 mg via INTRAVENOUS

## 2015-08-25 SURGICAL SUPPLY — 67 items
BIT DRILL 2.2 SS TIBIAL (BIT) ×4 IMPLANT
BNDG COHESIVE 4X5 TAN STRL (GAUZE/BANDAGES/DRESSINGS) ×3 IMPLANT
BNDG ESMARK 4X12 TAN STRL LF (GAUZE/BANDAGES/DRESSINGS) ×3 IMPLANT
CANISTER SUCT 1200ML W/VALVE (MISCELLANEOUS) ×3 IMPLANT
CHLORAPREP W/TINT 26ML (MISCELLANEOUS) ×4 IMPLANT
CLOSURE WOUND 1/2 X4 (GAUZE/BANDAGES/DRESSINGS) ×1
CORD BIP STRL DISP 12FT (MISCELLANEOUS) ×3 IMPLANT
CUFF TOURN 18 STER (MISCELLANEOUS) ×2 IMPLANT
DRAPE FLUOR MINI C-ARM 54X84 (DRAPES) ×3 IMPLANT
DRAPE IMP U-DRAPE 54X76 (DRAPES) ×3 IMPLANT
DRAPE SURG 17X11 SM STRL (DRAPES) ×3 IMPLANT
DRAPE U-SHAPE 47X51 STRL (DRAPES) ×1 IMPLANT
ELECT CAUTERY BLADE 6.4 (BLADE) ×3 IMPLANT
ELECT REM PT RETURN 9FT ADLT (ELECTROSURGICAL) ×3
ELECTRODE REM PT RTRN 9FT ADLT (ELECTROSURGICAL) ×1 IMPLANT
FORCEPS JEWEL BIP 4-3/4 STR (INSTRUMENTS) ×3 IMPLANT
GAUZE PETRO XEROFOAM 1X8 (MISCELLANEOUS) ×3 IMPLANT
GAUZE SPONGE 4X4 12PLY STRL (GAUZE/BANDAGES/DRESSINGS) ×3 IMPLANT
GLOVE BIO SURGEON STRL SZ 6.5 (GLOVE) ×2 IMPLANT
GLOVE BIO SURGEON STRL SZ8 (GLOVE) ×3 IMPLANT
GLOVE BIO SURGEONS STRL SZ 6.5 (GLOVE) ×2
GLOVE BIOGEL PI IND STRL 7.0 (GLOVE) IMPLANT
GLOVE BIOGEL PI INDICATOR 7.0 (GLOVE) ×4
GLOVE INDICATOR 8.0 STRL GRN (GLOVE) ×3 IMPLANT
GOWN STRL REUS W/ TWL LRG LVL3 (GOWN DISPOSABLE) ×1 IMPLANT
GOWN STRL REUS W/ TWL XL LVL3 (GOWN DISPOSABLE) ×1 IMPLANT
GOWN STRL REUS W/TWL LRG LVL3 (GOWN DISPOSABLE) ×6
GOWN STRL REUS W/TWL XL LVL3 (GOWN DISPOSABLE) ×3
K-WIRE 1.6 (WIRE) ×12
K-WIRE FX5X1.6XNS BN SS (WIRE) ×4
KIT RM TURNOVER STRD PROC AR (KITS) ×3 IMPLANT
KWIRE FX5X1.6XNS BN SS (WIRE) IMPLANT
NDL FILTER BLUNT 18X1 1/2 (NEEDLE) ×1 IMPLANT
NEEDLE FILTER BLUNT 18X 1/2SAF (NEEDLE) ×2
NEEDLE FILTER BLUNT 18X1 1/2 (NEEDLE) ×1 IMPLANT
NS IRRIG 500ML POUR BTL (IV SOLUTION) ×3 IMPLANT
PACK EXTREMITY ARMC (MISCELLANEOUS) ×3 IMPLANT
PAD CAST CTTN 4X4 STRL (SOFTGOODS) ×2 IMPLANT
PADDING CAST COTTON 4X4 STRL (SOFTGOODS) ×6
PEG LOCKING SMOOTH 2.2X15 (Peg) ×2 IMPLANT
PEG LOCKING SMOOTH 2.2X16 (Screw) ×4 IMPLANT
PEG LOCKING SMOOTH 2.2X18 (Peg) ×2 IMPLANT
PEG LOCKING SMOOTH 2.2X20 (Screw) ×2 IMPLANT
PEG LOCKING SMOOTH 2.2X22 (Screw) ×2 IMPLANT
PLATE CROSSLOCK MINI RT (Plate) ×2 IMPLANT
SCREW  LP NL 2.7X13MM (Screw) ×2 IMPLANT
SCREW 2.7X14MM (Screw) ×2 IMPLANT
SCREW BN 14X2.7XNONLOCK 3 LD (Screw) IMPLANT
SCREW LOCK 14X2.7X 3 LD TPR (Screw) IMPLANT
SCREW LOCK 18X2.7X 3 LD TPR (Screw) IMPLANT
SCREW LOCKING 2.7X14 (Screw) ×6 IMPLANT
SCREW LOCKING 2.7X18 (Screw) ×3 IMPLANT
SCREW LOCKING 2.7X8MM (Screw) ×2 IMPLANT
SCREW LP NL 2.7X13MM (Screw) IMPLANT
SCREW LP NL 2.7X22MM (Screw) ×4 IMPLANT
SCREW NLOCK 2.7X14 (Screw) ×1 IMPLANT
SCREW NLOCK 24X2.7 3 LD (Screw) IMPLANT
SCREW NONLOCK 2.7X24 (Screw) ×3 IMPLANT
STAPLER SKIN PROX 35W (STAPLE) ×3 IMPLANT
STOCKINETTE IMPERVIOUS 9X36 MD (GAUZE/BANDAGES/DRESSINGS) ×3 IMPLANT
STRIP CLOSURE SKIN 1/2X4 (GAUZE/BANDAGES/DRESSINGS) ×1 IMPLANT
SUT PROLENE 4 0 PS 2 18 (SUTURE) ×3 IMPLANT
SUT VIC AB 2-0 SH 27 (SUTURE) ×6
SUT VIC AB 2-0 SH 27XBRD (SUTURE) ×1 IMPLANT
SUT VIC AB 3-0 SH 27 (SUTURE) ×3
SUT VIC AB 3-0 SH 27X BRD (SUTURE) ×1 IMPLANT
SYRINGE 10CC LL (SYRINGE) ×3 IMPLANT

## 2015-08-25 NOTE — Progress Notes (Signed)
Spoke with Dr. Roland Rack via phone informing of pt's uncontrolled pain rating 10/10 despite meds given (see MAR).  O2 sats from 85-90 with 3 L O2 via N/C.  Dr. Roland Rack advised to continue monitoring pt.  Did not want to admit for obs tonight.  Pt is resting at times with eyes closed. Tearful at times.  Will continue to monitor.

## 2015-08-25 NOTE — OR Nursing (Signed)
Last bruise on right hip upon arrival to unit.

## 2015-08-25 NOTE — Op Note (Signed)
Pre-Op Diagnosis:   Closed acute 3-part intra-articular distal radius fracture with ulnar styloid fracture, right wrist.  Post-Op Diagnosis:   Same.  Procedure:   Open reduction and internal fixation of three-part intra-articular right distal radius fracture.  Surgeon:   Pascal Lux, MD  Assistant:   Delma Post, PA-S  Anesthesia:   General LMA  Findings:   As above.  Complications:   None  EBL:   20 cc  Fluids:   1100 cc crystalloid  TT:   126 minutes at 250 mmHg  Drains:   None  Closure:   3-0 Vicryl subcuticular sutures  Implants:   Biomet DVR Cross-locked precontoured distal radius mini-locking plate.  Brief Clinical Note:   The patient is a 45 year old female who sustained the above-noted injury 4 days ago when she tripped on the edge of her sidewalk and fell onto her outstretched right hand while walking her dog. She presented emergency room where x-rays demonstrated the above-noted injury. She was splinted and referred to orthopedics. She presents at this time for definitive management of her injury.  Procedure:   The patient was brought into the operating room and lain in the supine position. After adequate general laryngal mask anesthesia was obtained, a timeout was performed to verify the appropriate surgical site. The patient's right hand and upper extremity were prepped with ChloraPrep solution before being draped sterilely. Preoperative antibiotics were administered. The limb was exsanguinated with an Esmarch and the tourniquet inflated to 250 mmHg. An approximately 7-8 cm incision was made over the volar aspect of the distal radius beginning at the volar flexion crease and extending proximally along the flexor carpi radialis tendon. The incision was carried down through the subcutaneous tissues to expose the superficial retinaculum. This was split the length of the incision directly over the flexor carpi radialis tendon. The FCR sheath was opened and the tendon  retracted ulnarly to protect the median nerve. The floor of the FCR sheath was opened to expose the pronator quadratus muscle. This was released along the radial insertion and the muscle was retracted ulnarly to expose the distal radius. The fracture was identified and soft tissues elevated off the distal metaphyseal region for several centimeters. The appropriate sized plate was selected and positioned on the distal radius. A guidewire was placed through the distal hole and its position verified using FluoroScan imaging in AP and lateral projections. After several attempts, the plate appeared to be centered appropriately on the distal radius. The radial styloid was carefully reduced and temporarily secured using a K wire. The distal portion of the plate was secured using several locking pins and nonlocking screws. After placing these pins and screws, it was noted that the plate had somehow shifted and was no longer centered on the distal radius. Therefore, pins and screws were removed and the plate repositioned this time it was held with two K-wires distally and a third K-wire proximally. Again the adequacy of plate position was verified fluoroscopically in AP and lateral projections and found to be excellent. The 7 distal holes were secured using locking pins or nonlocking screws as deemed appropriate. The adequacy of hardware position and fracture reduction was verified using FluoroScan imaging in AP, lateral, and several additional oblique projections to be sure that the hardware did not enter the joint nor did it penetrate dorsally. Proximally, a single bicortical nonlocking screw was placed proximally to pull the plate gently down toward the shaft. This screw was later removed and replaced with a shorter  nonlocking cortical screw of appropriate length. Two additional locking cortical screws were placed to secure the plate to the metaphyseal region proximally. Again the construct was assessed using FluoroScan  imaging in AP, lateral, and oblique projections and found to be excellent.  The wound was copiously irrigated with sterile saline solution before the pronator quadratus was reapproximated using 2-0 Vicryl interrupted sutures. The subcutaneous tissues were closed using 2-0 Vicryl interrupted sutures before the subcuticular layer was closed using 3-0 Vicryl inverted interrupted sutures. Benzoin and Steri-Strips are applied to the skin. A total of 10 cc of 0.5% plain Sensorcaine was injected in and around the incision site to help with postoperative analgesia before a sterile bulky dressing was applied to the wound. The patient was placed into a volar splint maintaining the wrist in neutral position before the patient was awakened, extubated, and returned to the recovery room in satisfactory condition after tolerating the procedure well.

## 2015-08-25 NOTE — Progress Notes (Signed)
Pt O2 sats are betwee 85 and 90 with 2 L O2.  Pt is holding breath.  Explained pt to breath through nose and not to hold breath.  Unable to give pain medication with O2 sats low.  Providing emotional support to pt. Dr. Randa Lynn at bedside and advised to give PO pain meds.  Gave crackers and fluid to drink in order to give PO meds.  Pt lying in bed quietly.  RN at bedside.

## 2015-08-25 NOTE — Anesthesia Postprocedure Evaluation (Signed)
Anesthesia Post Note  Patient: Misty Green  Procedure(s) Performed: Procedure(s) (LRB): OPEN REDUCTION INTERNAL FIXATION (ORIF) WRIST FRACTURE (Right)  Patient location during evaluation: PACU Anesthesia Type: General Level of consciousness: awake and alert and oriented Pain management: pain level controlled Vital Signs Assessment: post-procedure vital signs reviewed and stable Respiratory status: spontaneous breathing, nonlabored ventilation, respiratory function stable and patient connected to nasal cannula oxygen Cardiovascular status: blood pressure returned to baseline and stable Postop Assessment: no signs of nausea or vomiting Anesthetic complications: no Comments: Discussed with patient and husband the importance of use of CPAP overnight tonight    Last Vitals:  Vitals:   08/25/15 2011 08/25/15 2031  BP:  132/75  Pulse: 79 77  Resp: 11   Temp:      Last Pain:  Vitals:   08/25/15 2031  TempSrc:   PainSc: 9                  Misty Green

## 2015-08-25 NOTE — H&P (Signed)
Paper H&P to be scanned into permanent record. H&P reviewed. No changes. 

## 2015-08-25 NOTE — Progress Notes (Signed)
Dr. Randa Lynn at bedside to observe pt.  [place] aware of pt's O2 sats hx.  Encouraged pt to wear cpap tonight.  Pt and husband stated understanding and agreed.  Pt AO, NAD.  D/C to home

## 2015-08-25 NOTE — Transfer of Care (Signed)
Immediate Anesthesia Transfer of Care Note  Patient: Misty Green  Procedure(s) Performed: Procedure(s): OPEN REDUCTION INTERNAL FIXATION (ORIF) WRIST FRACTURE (Right)  Patient Location: PACU  Anesthesia Type:General  Level of Consciousness: awake and patient cooperative  Airway & Oxygen Therapy: Patient Spontanous Breathing and Patient connected to face mask oxygen  Post-op Assessment: Report given to RN and Post -op Vital signs reviewed and stable  Post vital signs: Reviewed and stable  Last Vitals:  Vitals:   08/25/15 1403  BP: 130/73  Pulse: 86  Resp: 20  Temp: 36.8 C    Last Pain:  Vitals:   08/25/15 1456  TempSrc:   PainSc: 8          Complications: No apparent anesthesia complications

## 2015-08-25 NOTE — Discharge Instructions (Addendum)
AMBULATORY SURGERY  DISCHARGE INSTRUCTIONS   1) The drugs that you were given will stay in your system until tomorrow so for the next 24 hours you should not:  A) Drive an automobile B) Make any legal decisions C) Drink any alcoholic beverage   2) You may resume regular meals tomorrow.  Today it is better to start with liquids and gradually work up to solid foods.  You may eat anything you prefer, but it is better to start with liquids, then soup and crackers, and gradually work up to solid foods.   3) Please notify your doctor immediately if you have any unusual bleeding, trouble breathing, redness and pain at the surgery site, drainage, fever, or pain not relieved by medication.    4) Additional Instructions:        Please contact your physician with any problems or Same Day Surgery at [phone], Monday through Friday 6 am to 4 pm, or [place] at [facility] number at [phone].Keep splint dry and intact. Keep hand elevated above heart level. Apply ice to affected area frequently. Return for follow-up in 10-14 days or as scheduled.

## 2015-08-25 NOTE — Anesthesia Preprocedure Evaluation (Signed)
Anesthesia Evaluation  Patient identified by MRN, date of birth, ID band Patient awake    Reviewed: Allergy & Precautions, H&P , NPO status , Patient's Chart, lab work & pertinent test results  History of Anesthesia Complications Negative for: history of anesthetic complications  Airway Mallampati: II  TM Distance: >3 FB Neck ROM: full    Dental  (+) Poor Dentition, Chipped   Pulmonary sleep apnea, Continuous Positive Airway Pressure Ventilation and Oxygen sleep apnea , COPD, Current Smoker,    Pulmonary exam normal breath sounds clear to auscultation       Cardiovascular Exercise Tolerance: Good (-) angina(-) Past MI and (-) DOE Normal cardiovascular exam Rhythm:regular Rate:Normal     Neuro/Psych negative neurological ROS  negative psych ROS   GI/Hepatic negative GI ROS, Neg liver ROS, neg GERD  ,  Endo/Other  negative endocrine ROS  Renal/GU negative Renal ROS  negative genitourinary   Musculoskeletal   Abdominal   Peds  Hematology negative hematology ROS (+)   Anesthesia Other Findings Past Medical History: No date: COPD (chronic obstructive pulmonary disease) (*  Past Surgical History: No date: ABDOMINAL HYSTERECTOMY No date: BACK SURGERY No date: cesction No date: HERNIA REPAIR  BMI    Body Mass Index:  26.61 kg/m      Reproductive/Obstetrics negative OB ROS                             Anesthesia Physical Anesthesia Plan  ASA: III  Anesthesia Plan: General LMA   Post-op Pain Management:    Induction:   Airway Management Planned:   Additional Equipment:   Intra-op Plan:   Post-operative Plan:   Informed Consent: I have reviewed the patients History and Physical, chart, labs and discussed the procedure including the risks, benefits and alternatives for the proposed anesthesia with the patient or authorized representative who has indicated his/her understanding  and acceptance.   Dental Advisory Given  Plan Discussed with: Anesthesiologist, CRNA and Surgeon  Anesthesia Plan Comments:         Anesthesia Quick Evaluation

## 2015-08-25 NOTE — Anesthesia Procedure Notes (Signed)
Procedure Name: LMA Insertion Date/Time: 08/25/2015 3:37 PM Performed by: Jonna Clark Pre-anesthesia Checklist: Patient identified, Patient being monitored, Timeout performed, Emergency Drugs available and Suction available Patient Re-evaluated:Patient Re-evaluated prior to inductionOxygen Delivery Method: Circle system utilized Preoxygenation: Pre-oxygenation with 100% oxygen Intubation Type: IV induction Ventilation: Mask ventilation without difficulty LMA: LMA inserted LMA Size: 3.5 Tube type: Oral Number of attempts: 1 Placement Confirmation: positive ETCO2 and breath sounds checked- equal and bilateral Tube secured with: Tape Dental Injury: Teeth and Oropharynx as per pre-operative assessment

## 2015-08-26 ENCOUNTER — Encounter: Payer: Self-pay | Admitting: Surgery

## 2015-12-15 ENCOUNTER — Ambulatory Visit (INDEPENDENT_AMBULATORY_CARE_PROVIDER_SITE_OTHER): Payer: 59 | Admitting: Otolaryngology

## 2016-02-25 DIAGNOSIS — M545 Low back pain: Secondary | ICD-10-CM | POA: Diagnosis not present

## 2016-02-25 DIAGNOSIS — J449 Chronic obstructive pulmonary disease, unspecified: Secondary | ICD-10-CM | POA: Diagnosis not present

## 2016-03-03 DIAGNOSIS — M5416 Radiculopathy, lumbar region: Secondary | ICD-10-CM | POA: Diagnosis not present

## 2016-03-17 DIAGNOSIS — G4733 Obstructive sleep apnea (adult) (pediatric): Secondary | ICD-10-CM | POA: Diagnosis not present

## 2016-03-21 ENCOUNTER — Encounter (HOSPITAL_COMMUNITY): Payer: Self-pay

## 2016-03-21 ENCOUNTER — Inpatient Hospital Stay (HOSPITAL_COMMUNITY)
Admission: EM | Admit: 2016-03-21 | Discharge: 2016-03-24 | DRG: 189 | Disposition: A | Discharge status: Home / Self Care | Payer: 59 | Attending: Internal Medicine | Admitting: Internal Medicine

## 2016-03-21 ENCOUNTER — Emergency Department (HOSPITAL_COMMUNITY): Payer: 59

## 2016-03-21 DIAGNOSIS — R0602 Shortness of breath: Secondary | ICD-10-CM | POA: Diagnosis not present

## 2016-03-21 DIAGNOSIS — J9602 Acute respiratory failure with hypercapnia: Secondary | ICD-10-CM

## 2016-03-21 DIAGNOSIS — G4700 Insomnia, unspecified: Secondary | ICD-10-CM | POA: Diagnosis present

## 2016-03-21 DIAGNOSIS — F1129 Opioid dependence with unspecified opioid-induced disorder: Secondary | ICD-10-CM | POA: Diagnosis not present

## 2016-03-21 DIAGNOSIS — G8929 Other chronic pain: Secondary | ICD-10-CM | POA: Diagnosis present

## 2016-03-21 DIAGNOSIS — Z9989 Dependence on other enabling machines and devices: Secondary | ICD-10-CM

## 2016-03-21 DIAGNOSIS — Z9889 Other specified postprocedural states: Secondary | ICD-10-CM

## 2016-03-21 DIAGNOSIS — M549 Dorsalgia, unspecified: Secondary | ICD-10-CM | POA: Diagnosis present

## 2016-03-21 DIAGNOSIS — J9600 Acute respiratory failure, unspecified whether with hypoxia or hypercapnia: Secondary | ICD-10-CM | POA: Diagnosis present

## 2016-03-21 DIAGNOSIS — Z90710 Acquired absence of both cervix and uterus: Secondary | ICD-10-CM

## 2016-03-21 DIAGNOSIS — G894 Chronic pain syndrome: Secondary | ICD-10-CM | POA: Diagnosis present

## 2016-03-21 DIAGNOSIS — D751 Secondary polycythemia: Secondary | ICD-10-CM | POA: Diagnosis not present

## 2016-03-21 DIAGNOSIS — J9601 Acute respiratory failure with hypoxia: Principal | ICD-10-CM | POA: Diagnosis present

## 2016-03-21 DIAGNOSIS — G4733 Obstructive sleep apnea (adult) (pediatric): Secondary | ICD-10-CM | POA: Diagnosis present

## 2016-03-21 DIAGNOSIS — J441 Chronic obstructive pulmonary disease with (acute) exacerbation: Secondary | ICD-10-CM | POA: Diagnosis not present

## 2016-03-21 DIAGNOSIS — F419 Anxiety disorder, unspecified: Secondary | ICD-10-CM | POA: Diagnosis present

## 2016-03-21 DIAGNOSIS — Z79899 Other long term (current) drug therapy: Secondary | ICD-10-CM

## 2016-03-21 DIAGNOSIS — F17210 Nicotine dependence, cigarettes, uncomplicated: Secondary | ICD-10-CM | POA: Diagnosis present

## 2016-03-21 DIAGNOSIS — F1120 Opioid dependence, uncomplicated: Secondary | ICD-10-CM | POA: Diagnosis present

## 2016-03-21 DIAGNOSIS — Z7951 Long term (current) use of inhaled steroids: Secondary | ICD-10-CM

## 2016-03-21 HISTORY — DX: Nicotine dependence, unspecified, uncomplicated: F17.200

## 2016-03-21 LAB — CBC WITH DIFFERENTIAL/PLATELET
Basophils Absolute: 0 10*3/uL (ref 0.0–0.1)
Basophils Relative: 0 %
EOS ABS: 0.1 10*3/uL (ref 0.0–0.7)
EOS PCT: 1 %
HCT: 59.1 % — ABNORMAL HIGH (ref 36.0–46.0)
Hemoglobin: 18.9 g/dL — ABNORMAL HIGH (ref 12.0–15.0)
LYMPHS ABS: 1.7 10*3/uL (ref 0.7–4.0)
Lymphocytes Relative: 20 %
MCH: 33.2 pg (ref 26.0–34.0)
MCHC: 32 g/dL (ref 30.0–36.0)
MCV: 103.7 fL — ABNORMAL HIGH (ref 78.0–100.0)
MONO ABS: 0.8 10*3/uL (ref 0.1–1.0)
Monocytes Relative: 9 %
Neutro Abs: 5.9 10*3/uL (ref 1.7–7.7)
Neutrophils Relative %: 69 %
PLATELETS: 166 10*3/uL (ref 150–400)
RBC: 5.7 MIL/uL — ABNORMAL HIGH (ref 3.87–5.11)
RDW: 13.7 % (ref 11.5–15.5)
WBC: 8.5 10*3/uL (ref 4.0–10.5)

## 2016-03-21 LAB — BASIC METABOLIC PANEL
Anion gap: 6 (ref 5–15)
BUN: 8 mg/dL (ref 6–20)
CALCIUM: 9.2 mg/dL (ref 8.9–10.3)
CO2: 34 mmol/L — ABNORMAL HIGH (ref 22–32)
CREATININE: 0.54 mg/dL (ref 0.44–1.00)
Chloride: 98 mmol/L — ABNORMAL LOW (ref 101–111)
GFR calc Af Amer: >60 mL/min (ref >60)
GLUCOSE: 109 mg/dL — AB (ref 65–99)
Potassium: 3.9 mmol/L (ref 3.5–5.1)
SODIUM: 138 mmol/L (ref 135–145)

## 2016-03-21 MED ORDER — IPRATROPIUM BROMIDE 0.02 % IN SOLN
0.5000 mg | Freq: Once | RESPIRATORY_TRACT | Status: AC
  Administered 2016-03-21: 0.5 mg via RESPIRATORY_TRACT
  Filled 2016-03-21: qty 2.5

## 2016-03-21 MED ORDER — SODIUM CHLORIDE 0.9 % IV SOLN
INTRAVENOUS | Status: DC
  Administered 2016-03-21: 21:00:00 via INTRAVENOUS

## 2016-03-21 MED ORDER — METHYLPREDNISOLONE SODIUM SUCC 125 MG IJ SOLR
125.0000 mg | Freq: Once | INTRAMUSCULAR | Status: AC
  Administered 2016-03-21: 125 mg via INTRAVENOUS
  Filled 2016-03-21: qty 2

## 2016-03-21 MED ORDER — ALBUTEROL (5 MG/ML) CONTINUOUS INHALATION SOLN
10.0000 mg/h | INHALATION_SOLUTION | Freq: Once | RESPIRATORY_TRACT | Status: AC
  Administered 2016-03-21: 10 mg/h via RESPIRATORY_TRACT
  Filled 2016-03-21: qty 20

## 2016-03-21 NOTE — ED Triage Notes (Signed)
Pt has been on narcotics for 4  Years.  Pt feels she is addicted.  Pain pills aren't working.  Pt states she feels like she is going crazy d/t pain and inability to control.  Pt also has decreased oxygen sats at 75% on RA.  Chronic cough.  Pt has COPD and is a smoker.

## 2016-03-21 NOTE — ED Notes (Signed)
Visitor comes out and tells me that pt is in pain and needs pain medication.  Advised them that I would come in and check on pt shortly, he tells me that she is here due to wanting to get off of pain medication, advised that generally no pain medication is given to patients coming in wishing to get help getting off of pain pills.

## 2016-03-21 NOTE — ED Notes (Signed)
Pt is tearful, states that her back pain is awful, states that she feels no relief from her pain medication.  No weakness or numbness with back pain.  Pain in back has been chronic but increasing since last year.  Pt with hx of CPOD-current smoker.  Home O2 2.5L at night.  Pt is tearful, speaking in full sentences.  86% on 2L, increased O2 to 3L [place].  Pt spo2 is 90% on 3L.  Pt is tearful due to the back pain.  Sitting on the side of the bed.

## 2016-03-21 NOTE — ED Notes (Signed)
Pt had to go to bathroom.  Ambulated there off o2 after breathing treatment.  Pt 77% on RA after walking to bathroom.  Pt states that she feels weak.  She is back on 3L [place].  EDP at the bedside and aware.

## 2016-03-21 NOTE — ED Provider Notes (Signed)
[facility] DEPT Provider Note   CSN: [id number] Arrival date & time: 03/21/16  1812     History   Chief Complaint Chief Complaint  Patient presents with  . Drug Problem  . Shortness of Breath    HPI Misty Green is a 46 y.o. female.  46 year old female with history of COPD presents short of breath with cough. Also complains of worsening chronic pain in her back. Denies any new focal neurological deficits. Pulse oximetry was found to be in the mid 70s. Patient states that she uses BiPAP at night with oxygen but denies any daily oxygen use. She also uses home albuterol treatments. States that she has been increasingly weak over the last few days as well as has dyspnea on exertion. Denies any fever or chills.      Past Medical History:  Diagnosis Date  . COPD (chronic obstructive pulmonary disease) (HCC)     There are no active problems to display for this patient.   Past Surgical History:  Procedure Laterality Date  . ABDOMINAL HYSTERECTOMY    . BACK SURGERY    . cesction    . HERNIA REPAIR    . ORIF WRIST FRACTURE Right 08/25/2015   Procedure: OPEN REDUCTION INTERNAL FIXATION (ORIF) WRIST FRACTURE;  Surgeon: Corky Mull, MD;  Location: [facility] ORS;  Service: Orthopedics;  Laterality: Right;    OB History    No data available       Home Medications    Prior to Admission medications   Medication Sig Start Date End Date Taking? Authorizing Provider  albuterol (PROVENTIL HFA;VENTOLIN HFA) 108 (90 Base) MCG/ACT inhaler Inhale into the lungs every 6 (six) hours as needed for wheezing or shortness of breath.    Historical Provider, MD  albuterol (PROVENTIL) (2.5 MG/3ML) 0.083% nebulizer solution Take 2.5 mg by nebulization every 6 (six) hours as needed for wheezing or shortness of breath.    Historical Provider, MD  ALPRAZolam Duanne Moron) 1 MG tablet Take 1 mg by mouth 4 (four) times daily.    Historical Provider, MD  Fluticasone-Salmeterol (ADVAIR) 250-50 MCG/DOSE  AEPB Inhale 1 puff into the lungs 2 (two) times daily.    Historical Provider, MD  furosemide (LASIX) 40 MG tablet Take 40 mg by mouth.    Historical Provider, MD  gabapentin (NEURONTIN) 300 MG capsule Take 300 mg by mouth 3 (three) times daily.    Historical Provider, MD  Multiple Vitamins-Minerals (MULTIVITAMIN WITH MINERALS) tablet Take 1 tablet by mouth daily.    Historical Provider, MD  oxyCODONE (ROXICODONE) 15 MG immediate release tablet Take 1 tablet (15 mg total) by mouth every 4 (four) hours as needed for pain. 08/25/15   Corky Mull, MD  promethazine (PHENERGAN) 25 MG tablet Take 1 tablet (25 mg total) by mouth every 6 (six) hours as needed for nausea or vomiting. 08/21/15   Pierce Crane Beers, PA-C  roflumilast (DALIRESP) 500 MCG TABS tablet Take 500 mcg by mouth daily.    Historical Provider, MD  tiotropium (SPIRIVA) 18 MCG inhalation capsule Place 18 mcg into inhaler and inhale daily.    Historical Provider, MD  zolpidem (AMBIEN) 10 MG tablet Take 10 mg by mouth at bedtime as needed for sleep.    Historical Provider, MD    Family History History reviewed. No pertinent family history.  Social History Social History  Substance Use Topics  . Smoking status: Current Every Day Smoker    Packs/day: 1.00    Types: Cigarettes  . Smokeless  tobacco: Never Used  . Alcohol use No     Allergies   Patient has no known allergies.   Review of Systems Review of Systems  All other systems reviewed and are negative.    Physical Exam Updated Vital Signs BP 136/93 (BP Location: Right Arm)   Pulse 83   Temp 99 F (37.2 C) (Oral)   Resp 18   SpO2 (!) 87%   Physical Exam  Constitutional: She is oriented to person, place, and time. She appears well-developed and well-nourished.  Non-toxic appearance. No distress.  HENT:  Head: Normocephalic and atraumatic.  Eyes: Conjunctivae, EOM and lids are normal. Pupils are equal, round, and reactive to light.  Neck: Normal range of motion. Neck  supple. No tracheal deviation present. No thyroid mass present.  Cardiovascular: Normal rate, regular rhythm and normal heart sounds.  Exam reveals no gallop.   No murmur heard. Pulmonary/Chest: Effort normal. No stridor. No respiratory distress. She has decreased breath sounds. She has wheezes. She has no rhonchi. She has no rales.  Abdominal: Soft. Normal appearance and bowel sounds are normal. She exhibits no distension. There is no tenderness. There is no rebound and no CVA tenderness.  Musculoskeletal: Normal range of motion. She exhibits no edema or tenderness.  Neurological: She is alert and oriented to person, place, and time. She has normal strength. No cranial nerve deficit or sensory deficit. GCS eye subscore is 4. GCS verbal subscore is 5. GCS motor subscore is 6.  Skin: Skin is warm and dry. No abrasion and no rash noted.  Psychiatric: She has a normal mood and affect. Her speech is normal and behavior is normal.  Nursing note and vitals reviewed.    ED Treatments / Results  Labs (all labs ordered are listed, but only abnormal results are displayed) Labs Reviewed  CBC WITH DIFFERENTIAL/PLATELET  BASIC METABOLIC PANEL    EKG  EKG Interpretation None       Radiology Dg Chest 2 View  Result Date: 03/21/2016 CLINICAL DATA:  Shortness of breath today. History of COPD and CHF 2 years ago. Smoker. EXAM: CHEST  2 VIEW COMPARISON:  Chest x-ray dated 01/23/2013. FINDINGS: Heart size and mediastinal contours are stable. Lungs are hyperexpanded. Coarse interstitial markings are seen bilaterally, slightly progressed in the interval, suggesting chronic interstitial lung disease. Pulmonary granulomas bilaterally. No pleural effusion or pneumothorax seen. No acute or suspicious osseous finding. IMPRESSION: 1. Hyperexpanded lungs indicating COPD. Associated chronic interstitial lung disease. 2. No acute findings.  No evidence of pneumonia or pulmonary edema. Electronically Signed   By: Franki Cabot M.D.   On: 03/21/2016 19:26    Procedures Procedures (including critical care time)  Medications Ordered in ED Medications  0.9 %  sodium chloride infusion (not administered)  methylPREDNISolone sodium succinate (SOLU-MEDROL) 125 mg/2 mL injection 125 mg (not administered)  albuterol (PROVENTIL,VENTOLIN) solution continuous neb (not administered)  ipratropium (ATROVENT) nebulizer solution 0.5 mg (not administered)     Initial Impression / Assessment and Plan / ED Course  I have reviewed the triage vital signs and the nursing notes.  Pertinent labs & imaging results that were available during my care of the patient were reviewed by me and considered in my medical decision making (see chart for details).     Patient's chest x-ray noted. Have wheezing on exam. No history of COPD continues to smoke cigarettes. Treated with Solu-Medrol as well as albuterol. Patient related to the bathroom and continued to desat. Suspect having COPD exacerbation.  Patient also complaining of chronic pain. Will consult hospitalist for admission for treatment of her serum exacerbation.  Final Clinical Impressions(s) / ED Diagnoses   Final diagnoses:  None    New Prescriptions New Prescriptions   No medications on file     Lacretia Leigh, MD 03/21/16 2247

## 2016-03-21 NOTE — ED Notes (Signed)
Pt is sitting up in room.  3L [place] before RT came to begin hour long neb.  Family is at the bedside with her

## 2016-03-21 NOTE — H&P (Addendum)
History and Physical    Misty Green RDE:[id number] DOB: 1970/04/25 DOA: 03/21/2016  Referring MD/NP/PA: Dr. Zenia Resides PCP: Alonza Bogus, MD  Patient coming from: Home   Chief Complaint: " I'm on pain medications and think I'm addicted "  HPI: Misty Green is a 46 y.o. 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 She is undergone multiple back surgeries and reports living daily with excruciating back pain. Currently takingoxycodone 20 mg up to 4 times daily, and still has no relief of back pain. In the middle of last month patient notes that she underwent a nerve block, but reports that it was unsuccessful.  She even admits to taking more medication than what she is prescribed, and still not getting relief of symptoms. Patient reports worsening fatigue, headaches, leg swelling, productive cough, intermittent leg swelling worse on right lower extremity, and husband notes syncopal episode that happened over 2 weeks ago. Patient was not evaluated for the syncopal episode. Denies any chest pain, hemoptysis, nausea, dysuria, urinary retention, saddle paresthesia, or focal weakness.   Evaluation of the [place]  controlleddatabase reveals that the patient was on oxycodone 5 mg and Xanax 1mg  back in September 2017,  but had titrated up to oxycodone 20 mg 4 x/ day and Xanax 1 mg 4x/day per month. All prescriptions have been coming from the patient's primary care provider.  ED Course: On admission to the emergency department patient was seen to be afebrile, pulse 78-101, respirations 15-24, O2 saturation as low as 75% on room air. Lab work revealed WBC 8.5, hemoglobin 18.9, platelets 166, sodium 138, potassium 3.9, chloride 98, CO2 34, BUN 8, creatinine 0.54. Chest x-ray  showing hyperinflation suggestive of COPD without acute infiltrate.   Review of Systems: As per HPI otherwise 10 point review of systems negative.   Past Medical History:  Diagnosis Date  . COPD (chronic obstructive pulmonary disease) ([place])     Past Surgical History:  Procedure Laterality Date  . ABDOMINAL HYSTERECTOMY    . BACK SURGERY    . cesction    . HERNIA REPAIR    . ORIF WRIST FRACTURE Right 08/25/2015   Procedure: OPEN REDUCTION INTERNAL FIXATION (ORIF) WRIST FRACTURE;  Surgeon: Corky Mull, MD;  Location: [facility] ORS;  Service: Orthopedics;  Laterality: Right;     reports that she has been smoking Cigarettes.  She has been smoking about 1.00 pack per day. She has never used smokeless tobacco. She reports that she does not drink alcohol or use drugs.  No Known Allergies  History reviewed. No pertinent family history.  Prior to Admission medications   Medication Sig Start Date End Date Taking? Authorizing Provider  albuterol (PROVENTIL HFA;VENTOLIN HFA) 108 (90 Base) MCG/ACT inhaler Inhale into the lungs every 6 (six) hours as needed for wheezing or shortness of breath.    Historical Provider, MD  albuterol (PROVENTIL) (2.5 MG/3ML) 0.083% nebulizer solution Take 2.5 mg by nebulization every 6 (six) hours as needed for wheezing or shortness of breath.    Historical Provider, MD  ALPRAZolam Duanne Moron) 1 MG tablet Take 1 mg by mouth 4 (four) times daily.    Historical Provider, MD  Fluticasone-Salmeterol (ADVAIR) 250-50 MCG/DOSE AEPB Inhale 1 puff into the lungs 2 (two) times daily.    Historical Provider, MD  furosemide (  LASIX) 40 MG tablet Take 40 mg by mouth.    Historical Provider, MD  gabapentin (NEURONTIN) 300 MG capsule Take 300 mg by mouth 3 (three) times daily.    Historical Provider, MD  Multiple Vitamins-Minerals (MULTIVITAMIN WITH MINERALS) tablet Take 1 tablet by mouth daily.    Historical Provider, MD  oxyCODONE (ROXICODONE) 15 MG immediate release tablet Take 1 tablet  (15 mg total) by mouth every 4 (four) hours as needed for pain. 08/25/15   Corky Mull, MD  promethazine (PHENERGAN) 25 MG tablet Take 1 tablet (25 mg total) by mouth every 6 (six) hours as needed for nausea or vomiting. 08/21/15   Pierce Crane Beers, PA-C  roflumilast (DALIRESP) 500 MCG TABS tablet Take 500 mcg by mouth daily.    Historical Provider, MD  tiotropium (SPIRIVA) 18 MCG inhalation capsule Place 18 mcg into inhaler and inhale daily.    Historical Provider, MD  zolpidem (AMBIEN) 10 MG tablet Take 10 mg by mouth at bedtime as needed for sleep.    Historical Provider, MD    Physical Exam:    Constitutional: Middle-aged female who appears to be in moderate distress unable to get comfortable in the hospital gurney. Vitals:   03/21/16 2115 03/21/16 2200 03/21/16 2230 03/21/16 2320  BP:  142/85  128/71  Pulse:  88 89 88  Resp:  22  21  Temp:    98.9 F (37.2 C)  TempSrc:    Oral  SpO2: 92% 92% (!) 77% (!) 86%   Eyes: PERRL, lids and conjunctivae normal ENMT: Mucous membranes are moist. Posterior pharynx clear of any exudate or lesions.Normal dentition.  Neck: normal, supple, no masses, no thyromegaly Respiratory: Decreased overall aeration with expiratory wheezes appreciated. Mildly tachypneic. No accessory muscle use.  Cardiovascular: Regular rate and rhythm, no murmurs / rubs / gallops. No extremity edema. 2+ pedal pulses. No carotid bruits.  Abdomen: no tenderness, no masses palpated. No hepatosplenomegaly. Bowel sounds positive.  Musculoskeletal: no clubbing / cyanosis.  Healed scars from previous back surgeries. Patient has tenderness to palpation of the entire lumbar spine. No significant redness or erythema noted. Skin: no rashes, lesions, ulcers. No induration Neurologic: CN 2-12 grossly intact. Sensation intact, DTR normal. Strength 5/5 in all 4.  Psychiatric: Fair judgment and insight. Alert and oriented x 3. Anxious mood.     Labs on Admission: I have personally reviewed  following labs and imaging studies  CBC:  Recent Labs Lab 03/21/16 2109  WBC 8.5  NEUTROABS 5.9  HGB 18.9*  HCT 59.1*  MCV 103.7*  PLT 703   Basic Metabolic Panel:  Recent Labs Lab 03/21/16 2109  NA 138  K 3.9  CL 98*  CO2 34*  GLUCOSE 109*  BUN 8  CREATININE 0.54  CALCIUM 9.2   GFR: CrCl cannot be calculated (Unknown ideal weight.). Liver Function Tests: No results for input(s): AST, ALT, ALKPHOS, BILITOT, PROT, ALBUMIN in the last 168 hours. No results for input(s): LIPASE, AMYLASE in the last 168 hours. No results for input(s): AMMONIA in the last 168 hours. Coagulation Profile: No results for input(s): INR, PROTIME in the last 168 hours. Cardiac Enzymes: No results for input(s): CKTOTAL, CKMB, CKMBINDEX, TROPONINI in the last 168 hours. BNP (last 3 results) No results for input(s): PROBNP in the last 8760 hours. HbA1C: No results for input(s): HGBA1C in the last 72 hours. CBG: No results for input(s): GLUCAP in the last 168 hours. Lipid Profile: No results for input(s): CHOL, HDL, LDLCALC, TRIG,  CHOLHDL, LDLDIRECT in the last 72 hours. Thyroid Function Tests: No results for input(s): TSH, T4TOTAL, FREET4, T3FREE, THYROIDAB in the last 72 hours. Anemia Panel: No results for input(s): VITAMINB12, FOLATE, FERRITIN, TIBC, IRON, RETICCTPCT in the last 72 hours. Urine analysis: No results found for: COLORURINE, APPEARANCEUR, LABSPEC, PHURINE, GLUCOSEU, HGBUR, BILIRUBINUR, KETONESUR, PROTEINUR, UROBILINOGEN, NITRITE, LEUKOCYTESUR Sepsis Labs: No results found for this or any previous visit (from the past 240 hour(s)).   Radiological Exams on Admission: Dg Chest 2 View  Result Date: 03/21/2016 CLINICAL DATA:  Shortness of breath today. History of COPD and CHF 2 years ago. Smoker. EXAM: CHEST  2 VIEW COMPARISON:  Chest x-ray dated 01/23/2013. FINDINGS: Heart size and mediastinal contours are stable. Lungs are hyperexpanded. Coarse interstitial markings are seen  bilaterally, slightly progressed in the interval, suggesting chronic interstitial lung disease. Pulmonary granulomas bilaterally. No pleural effusion or pneumothorax seen. No acute or suspicious osseous finding. IMPRESSION: 1. Hyperexpanded lungs indicating COPD. Associated chronic interstitial lung disease. 2. No acute findings.  No evidence of pneumonia or pulmonary edema. Electronically Signed   By: Franki Cabot M.D.   On: 03/21/2016 19:26    EKG: Independently reviewed. Sinus rhythm with signs of biatrial enlargement  Assessment/Plan Acute respiratory failure with hypoxia and hypercapnia/COPD exacerbation: Patient on admission and seen to be hypoxic into the 70s on room air and improved only minimally with nasal cannula oxygen to the 80s. She was initially given 125 mg Solu-Medrol and hour-long breathing treatment without relief of symptoms. Checked an ABG which revealed pH 7.394, PCO2 58, PO2 60.8. Patient appears to be compensated so this appears to have been more likely a chronic process. - Admit to stepdown - COPD order set initiated - Trial of BiPAP - Continue Dailresp  - Brovana & Pulmicort Neb BID - Duonebs qid and prn SOB/Wheezing - Solumedrol 60mg  IV q 8hrs - Mucinex  - Added on d-dimer and BNP due to question of PE due to polycythemia versus CHF exacerbation possibly.  Chronic back pain /opioid dependence and abuse: Admits to taking more than what has been recently prescribed for pain by her PCP without relief. Patient likely needs to be referred to pain management or opioid  - Continue home regimen of gabapentin, oxycodone prn - Diluadid IV prn  - Needs to be referred to pain management/opioid abuse clinic - Follow-up with outpatient specialists who have performed previous surgical procedures   Polycythemia: Hemoglobin 18.9 on admission with elevated MCV and MCH. Suspect symptoms secondary to history of tobacco abuse and worsening COPD.  - Check vitamin B12 and folate -  Recheck CBC in a.m.  Anxiety - Continue home Xanax prn  Insomnia - Continue zolpidem   OSA on CPAP:Patient has home CPAP machine, but it appears to be broke. - Care management consult  Tobacco abuse: Patient reports smoking approximately a pack of cigarettes per day and has no will want to quit at this time. - Counseled on the need a cessation of tobacco   DVT prophylaxis: Lovenox   Code Status: Full Family Communication:  Disposition Plan: TBD Consults called:  None Admission status: Inpatient  Norval Morton MD [facility] Pager [phone]  If 7PM-7AM, please contact night-coverage [email or web address] Password [email or web address]  03/21/2016, 11:40 PM

## 2016-03-22 DIAGNOSIS — F1120 Opioid dependence, uncomplicated: Secondary | ICD-10-CM | POA: Diagnosis present

## 2016-03-22 DIAGNOSIS — G894 Chronic pain syndrome: Secondary | ICD-10-CM | POA: Diagnosis present

## 2016-03-22 DIAGNOSIS — J9600 Acute respiratory failure, unspecified whether with hypoxia or hypercapnia: Secondary | ICD-10-CM | POA: Diagnosis present

## 2016-03-22 DIAGNOSIS — J441 Chronic obstructive pulmonary disease with (acute) exacerbation: Secondary | ICD-10-CM | POA: Diagnosis present

## 2016-03-22 DIAGNOSIS — J9601 Acute respiratory failure with hypoxia: Principal | ICD-10-CM

## 2016-03-22 DIAGNOSIS — M549 Dorsalgia, unspecified: Secondary | ICD-10-CM

## 2016-03-22 DIAGNOSIS — G4700 Insomnia, unspecified: Secondary | ICD-10-CM | POA: Diagnosis present

## 2016-03-22 DIAGNOSIS — G8929 Other chronic pain: Secondary | ICD-10-CM | POA: Diagnosis not present

## 2016-03-22 DIAGNOSIS — G4733 Obstructive sleep apnea (adult) (pediatric): Secondary | ICD-10-CM | POA: Diagnosis not present

## 2016-03-22 DIAGNOSIS — F17210 Nicotine dependence, cigarettes, uncomplicated: Secondary | ICD-10-CM | POA: Diagnosis present

## 2016-03-22 DIAGNOSIS — D751 Secondary polycythemia: Secondary | ICD-10-CM | POA: Diagnosis not present

## 2016-03-22 DIAGNOSIS — F419 Anxiety disorder, unspecified: Secondary | ICD-10-CM | POA: Diagnosis present

## 2016-03-22 DIAGNOSIS — R079 Chest pain, unspecified: Secondary | ICD-10-CM | POA: Diagnosis not present

## 2016-03-22 DIAGNOSIS — Z9989 Dependence on other enabling machines and devices: Secondary | ICD-10-CM

## 2016-03-22 DIAGNOSIS — Z90710 Acquired absence of both cervix and uterus: Secondary | ICD-10-CM | POA: Diagnosis not present

## 2016-03-22 DIAGNOSIS — Z7951 Long term (current) use of inhaled steroids: Secondary | ICD-10-CM | POA: Diagnosis not present

## 2016-03-22 DIAGNOSIS — Z79899 Other long term (current) drug therapy: Secondary | ICD-10-CM | POA: Diagnosis not present

## 2016-03-22 DIAGNOSIS — Z9889 Other specified postprocedural states: Secondary | ICD-10-CM | POA: Diagnosis not present

## 2016-03-22 LAB — VITAMIN B12: Vitamin B-12: 869 pg/mL (ref 180–914)

## 2016-03-22 LAB — BLOOD GAS, ARTERIAL
Acid-Base Excess: 7.3 mmol/L — ABNORMAL HIGH (ref 0.0–2.0)
Bicarbonate: 34.6 mmol/L — ABNORMAL HIGH (ref 20.0–28.0)
DRAWN BY: 232811
O2 Content: 4 L/min
O2 Saturation: 90.5 %
PATIENT TEMPERATURE: 98.9
pCO2 arterial: 58 mmHg — ABNORMAL HIGH (ref 32.0–48.0)
pH, Arterial: 7.394 (ref 7.350–7.450)
pO2, Arterial: 60.8 mmHg — ABNORMAL LOW (ref 83.0–108.0)

## 2016-03-22 LAB — CBC
HEMATOCRIT: 57.6 % — AB (ref 36.0–46.0)
Hemoglobin: 18.3 g/dL — ABNORMAL HIGH (ref 12.0–15.0)
MCH: 32.7 pg (ref 26.0–34.0)
MCHC: 31.8 g/dL (ref 30.0–36.0)
MCV: 102.9 fL — ABNORMAL HIGH (ref 78.0–100.0)
PLATELETS: 173 10*3/uL (ref 150–400)
RBC: 5.6 MIL/uL — ABNORMAL HIGH (ref 3.87–5.11)
RDW: 13.7 % (ref 11.5–15.5)
WBC: 6.1 10*3/uL (ref 4.0–10.5)

## 2016-03-22 LAB — COMPREHENSIVE METABOLIC PANEL
ALBUMIN: 3.4 g/dL — AB (ref 3.5–5.0)
ALT: 14 U/L (ref 14–54)
AST: 17 U/L (ref 15–41)
Alkaline Phosphatase: 49 U/L (ref 38–126)
Anion gap: 11 (ref 5–15)
BILIRUBIN TOTAL: 0.5 mg/dL (ref 0.3–1.2)
BUN: 8 mg/dL (ref 6–20)
CHLORIDE: 99 mmol/L — AB (ref 101–111)
CO2: 30 mmol/L (ref 22–32)
Calcium: 9.3 mg/dL (ref 8.9–10.3)
Creatinine, Ser: 0.39 mg/dL — ABNORMAL LOW (ref 0.44–1.00)
GFR calc Af Amer: >60 mL/min (ref >60)
GFR calc non Af Amer: >60 mL/min (ref >60)
GLUCOSE: 145 mg/dL — AB (ref 65–99)
POTASSIUM: 4.2 mmol/L (ref 3.5–5.1)
SODIUM: 140 mmol/L (ref 135–145)
Total Protein: 6.8 g/dL (ref 6.5–8.1)

## 2016-03-22 LAB — D-DIMER, QUANTITATIVE (NOT AT ARMC): D DIMER QUANT: 0.41 ug{FEU}/mL (ref 0.00–0.50)

## 2016-03-22 LAB — BRAIN NATRIURETIC PEPTIDE: B Natriuretic Peptide: 20.2 pg/mL (ref 0.0–100.0)

## 2016-03-22 MED ORDER — METHYLPREDNISOLONE SODIUM SUCC 125 MG IJ SOLR
60.0000 mg | Freq: Three times a day (TID) | INTRAMUSCULAR | Status: DC
  Administered 2016-03-22 – 2016-03-23 (×4): 60 mg via INTRAVENOUS
  Filled 2016-03-22 (×4): qty 2

## 2016-03-22 MED ORDER — TIOTROPIUM BROMIDE MONOHYDRATE 18 MCG IN CAPS: 18.0000 ug | ORAL_CAPSULE | Freq: Every day | RESPIRATORY_TRACT | Status: DC

## 2016-03-22 MED ORDER — ALBUTEROL SULFATE (2.5 MG/3ML) 0.083% IN NEBU: 2.5000 mg | INHALATION_SOLUTION | RESPIRATORY_TRACT | Status: DC | PRN

## 2016-03-22 MED ORDER — SODIUM CHLORIDE 0.9% FLUSH
3.0000 mL | Freq: Two times a day (BID) | INTRAVENOUS | Status: DC
  Administered 2016-03-22 – 2016-03-24 (×5): 3 mL via INTRAVENOUS

## 2016-03-22 MED ORDER — HYDROMORPHONE HCL 1 MG/ML IJ SOLN
0.5000 mg | INTRAMUSCULAR | Status: DC | PRN
  Administered 2016-03-22 – 2016-03-24 (×7): 0.5 mg via INTRAVENOUS
  Filled 2016-03-22 (×8): qty 0.5

## 2016-03-22 MED ORDER — IPRATROPIUM-ALBUTEROL 0.5-2.5 (3) MG/3ML IN SOLN: 3.0000 mL | RESPIRATORY_TRACT | Status: DC | PRN

## 2016-03-22 MED ORDER — IPRATROPIUM-ALBUTEROL 0.5-2.5 (3) MG/3ML IN SOLN
3.0000 mL | Freq: Four times a day (QID) | RESPIRATORY_TRACT | Status: DC
  Administered 2016-03-22: 3 mL via RESPIRATORY_TRACT
  Filled 2016-03-22 (×2): qty 3

## 2016-03-22 MED ORDER — DIPHENHYDRAMINE-APAP (SLEEP) 25-500 MG PO TABS: 2.0000 | ORAL_TABLET | Freq: Every evening | ORAL | Status: DC | PRN

## 2016-03-22 MED ORDER — GUAIFENESIN ER 600 MG PO TB12
600.0000 mg | ORAL_TABLET | Freq: Two times a day (BID) | ORAL | Status: DC
  Administered 2016-03-22 – 2016-03-24 (×6): 600 mg via ORAL
  Filled 2016-03-22 (×7): qty 1

## 2016-03-22 MED ORDER — ROFLUMILAST 500 MCG PO TABS
500.0000 ug | ORAL_TABLET | Freq: Every day | ORAL | Status: DC
  Administered 2016-03-22 – 2016-03-24 (×3): 500 ug via ORAL
  Filled 2016-03-22 (×4): qty 1

## 2016-03-22 MED ORDER — ADULT MULTIVITAMIN W/MINERALS CH
1.0000 | ORAL_TABLET | Freq: Every day | ORAL | Status: DC
  Administered 2016-03-22 – 2016-03-24 (×3): 1 via ORAL
  Filled 2016-03-22 (×3): qty 1

## 2016-03-22 MED ORDER — ARFORMOTEROL TARTRATE 15 MCG/2ML IN NEBU
15.0000 ug | INHALATION_SOLUTION | Freq: Two times a day (BID) | RESPIRATORY_TRACT | Status: DC
  Administered 2016-03-22 – 2016-03-23 (×3): 15 ug via RESPIRATORY_TRACT
  Filled 2016-03-22 (×7): qty 2

## 2016-03-22 MED ORDER — BUDESONIDE 0.5 MG/2ML IN SUSP
0.5000 mg | Freq: Two times a day (BID) | RESPIRATORY_TRACT | Status: DC
  Administered 2016-03-22 – 2016-03-23 (×4): 0.5 mg via RESPIRATORY_TRACT
  Filled 2016-03-22 (×5): qty 2

## 2016-03-22 MED ORDER — ONDANSETRON HCL 4 MG/2ML IJ SOLN: 4.0000 mg | Freq: Four times a day (QID) | INTRAMUSCULAR | Status: DC | PRN

## 2016-03-22 MED ORDER — IPRATROPIUM-ALBUTEROL 0.5-2.5 (3) MG/3ML IN SOLN
3.0000 mL | Freq: Two times a day (BID) | RESPIRATORY_TRACT | Status: DC
  Administered 2016-03-22: 3 mL via RESPIRATORY_TRACT
  Filled 2016-03-22: qty 3

## 2016-03-22 MED ORDER — DOCUSATE SODIUM 100 MG PO CAPS
300.0000 mg | ORAL_CAPSULE | Freq: Every day | ORAL | Status: DC
  Administered 2016-03-22 – 2016-03-23 (×2): 300 mg via ORAL
  Filled 2016-03-22 (×3): qty 3

## 2016-03-22 MED ORDER — OXYCODONE HCL 5 MG PO TABS
20.0000 mg | ORAL_TABLET | Freq: Four times a day (QID) | ORAL | Status: DC | PRN
  Administered 2016-03-22 – 2016-03-24 (×11): 20 mg via ORAL
  Filled 2016-03-22 (×12): qty 4

## 2016-03-22 MED ORDER — IPRATROPIUM-ALBUTEROL 0.5-2.5 (3) MG/3ML IN SOLN
3.0000 mL | Freq: Three times a day (TID) | RESPIRATORY_TRACT | Status: DC
  Administered 2016-03-23 (×2): 3 mL via RESPIRATORY_TRACT
  Filled 2016-03-22 (×4): qty 3

## 2016-03-22 MED ORDER — GABAPENTIN 300 MG PO CAPS
300.0000 mg | ORAL_CAPSULE | Freq: Three times a day (TID) | ORAL | Status: DC
  Administered 2016-03-22 – 2016-03-24 (×8): 300 mg via ORAL
  Filled 2016-03-22 (×8): qty 1

## 2016-03-22 MED ORDER — ONDANSETRON HCL 4 MG PO TABS: 4.0000 mg | ORAL_TABLET | Freq: Four times a day (QID) | ORAL | Status: DC | PRN

## 2016-03-22 MED ORDER — SODIUM CHLORIDE 0.9% FLUSH: 3.0000 mL | INTRAVENOUS | Status: DC | PRN

## 2016-03-22 MED ORDER — ZOLPIDEM TARTRATE 5 MG PO TABS
5.0000 mg | ORAL_TABLET | Freq: Every evening | ORAL | Status: DC | PRN
  Administered 2016-03-22 – 2016-03-23 (×3): 5 mg via ORAL
  Filled 2016-03-22 (×3): qty 1

## 2016-03-22 MED ORDER — DOXYCYCLINE HYCLATE 100 MG PO TABS
100.0000 mg | ORAL_TABLET | Freq: Two times a day (BID) | ORAL | Status: DC
  Administered 2016-03-22 – 2016-03-24 (×5): 100 mg via ORAL
  Filled 2016-03-22 (×6): qty 1

## 2016-03-22 MED ORDER — ALPRAZOLAM 1 MG PO TABS
1.0000 mg | ORAL_TABLET | Freq: Four times a day (QID) | ORAL | Status: DC | PRN
  Administered 2016-03-22 – 2016-03-24 (×9): 1 mg via ORAL
  Filled 2016-03-22 (×6): qty 1
  Filled 2016-03-22 (×2): qty 2
  Filled 2016-03-22: qty 1

## 2016-03-22 MED ORDER — ACETAMINOPHEN 650 MG RE SUPP: 650.0000 mg | Freq: Four times a day (QID) | RECTAL | Status: DC | PRN

## 2016-03-22 MED ORDER — SODIUM CHLORIDE 0.9 % IV SOLN: 250.0000 mL | INTRAVENOUS | Status: DC | PRN

## 2016-03-22 MED ORDER — ENOXAPARIN SODIUM 40 MG/0.4ML ~~LOC~~ SOLN
40.0000 mg | SUBCUTANEOUS | Status: DC
  Administered 2016-03-23: 40 mg via SUBCUTANEOUS
  Filled 2016-03-22: qty 0.4

## 2016-03-22 MED ORDER — ACETAMINOPHEN 325 MG PO TABS
650.0000 mg | ORAL_TABLET | Freq: Four times a day (QID) | ORAL | Status: DC | PRN
  Administered 2016-03-23 – 2016-03-24 (×3): 650 mg via ORAL
  Filled 2016-03-22 (×3): qty 2

## 2016-03-22 NOTE — Progress Notes (Signed)
PT Cancellation Note  Patient Details Name: Misty Green MRN: [id number] DOB: [birth year]   Cancelled Treatment:    Reason Eval/Treat Not Completed: Medical issues which prohibited therapy Pt requiring supplemental oxygen and per notes, to be admitted to SDU.  Please page if PT evaluation is needed imminently.   [name],[name] 03/22/2016, 1:32 PM [name], PT, DPT 03/22/2016 Pager: [phone]

## 2016-03-22 NOTE — Progress Notes (Signed)
Patient has been off Bipap since around 0700. Currently doing well on  O2. Will deescalate level of care to tele.    Dessa Phi, DO [facility] [email or web address] Password [email or web address] 03/22/2016, 3:41 PM

## 2016-03-22 NOTE — ED Notes (Signed)
Admitting MD Dr. Tamala Julian requests Biap.  RT notified.  Pt continues to be restless. She cant lay still.  Spo2 86-90% on CPAP (home device).  Opened door to help cool down her room, unfortunately no separate AC controls available in pt room

## 2016-03-22 NOTE — ED Notes (Signed)
Pt is on her home cpap machine that she uses to sleep.  RT came to the bedside and helped set it up.  Husband is also at the bedside.  Pt moved to a real hospital bed that was retrieved from upstairs in order to make pt as comfortable as at all possible.  Explained to pt and family that there is a hold but that all orders will begin and treatment will be given down here as it would upstairs

## 2016-03-22 NOTE — Progress Notes (Signed)
RT entered room- PT with MD. PT off Bipap and on [place]- Sp02 84%. RT applied 4 lpm [place]- Sp02 came to 92%. RT administered scheduled neb tx. PT requested to remain on [place]. RN aware.

## 2016-03-22 NOTE — Progress Notes (Signed)
ANTICOAGULATION CONSULT NOTE - Initial Consult  Pharmacy Consult for Lovenox Indication: VTE prophylaxis  Allergies  Allergen Reactions  . Other Nausea And Vomiting    Anti-depressent that starts with t    Patient Measurements: Height: 5\' 4"  (162.6 cm) Weight: 141 lb (64 kg) IBW/kg (Calculated) : 54.7  Vital Signs: Temp: 98.4 F (36.9 C) (03/05 2115) Temp Source: Oral (03/05 2115) BP: 110/65 (03/05 2115) Pulse Rate: 87 (03/05 2115)  Labs:  Recent Labs  03/21/16 2109 03/22/16 0510  HGB 18.9* 18.3*  HCT 59.1* 57.6*  PLT 166 173  CREATININE 0.54 0.39*    Estimated Creatinine Clearance: 75.9 mL/min (by C-G formula based on SCr of 0.39 mg/dL (L)).   Medical History: Past Medical History:  Diagnosis Date  . COPD (chronic obstructive pulmonary disease) ([facility])     Assessment: 40 y/oF currently admitted for acute hypoxic respiratory failure 2/2 COPD exacerbation and chronic back pain with opioid dependence. Pharmacy consulted to dose Lovenox for VTE prophylaxis. Patient not on any anticoagulants PTA. SCr low at 0.39 with CrCl ~ 76 ml/min. CBC reveals Hgb 18.3, Pltc WNL. No bleeding issues currently reported.   Goal of Therapy:  Prevention of VTE   Plan:   Lovenox 40mg  SQ q24h.  Need for future dosage adjustment appears unlikely at present, so pharmacy will sign off at this time.  Please reconsult if a change in clinical status warrants re-evaluation of dosage.   Lindell Spar, PharmD, BCPS Pager: [phone] 03/22/2016 9:44 PM

## 2016-03-22 NOTE — Progress Notes (Signed)
PT remains off BiPAP and on 4 lpm [place]- Sp02 91%. PT states she is breathing fine at this time. PT refused 1200 neb tx (requested to changed Duo to BID).

## 2016-03-22 NOTE — Progress Notes (Signed)
Medical issues which prohibited therapy Pt requiring supplemental oxygen and per notes, to be admitted to SDU. Will recheck on pt next day.  [place], [place]

## 2016-03-22 NOTE — ED Notes (Signed)
Report given to [facility] for Room 1507.

## 2016-03-22 NOTE — ED Notes (Addendum)
Pt observed sitting up in bed, she took the BiPAP mask off, talking to her husband. Reminded pt to put the BiPAP back on, she sts something is wrong with it. RT called.

## 2016-03-22 NOTE — ED Notes (Signed)
Bed: WA17 Expected date:  Expected time:  Means of arrival:  Comments: Hold for room 22

## 2016-03-22 NOTE — Clinical Social Work Note (Signed)
Clinical Social Work Assessment  Patient Details  Name: Misty Green MRN: [id number] Date of Birth: 04-Jan-1971  Date of referral:  03/22/16               Reason for consult:   (COPD Gold, per notes)                Permission sought to share information with:  Facility Art therapist granted to share information::  No  Name::        Agency::     Relationship::     Contact Information:     Housing/Transportation Living arrangements for the past 2 months:  Single Family Home Source of Information:  Patient Patient Interpreter Needed:  None Criminal Activity/Legal Involvement Pertinent to Current Situation/Hospitalization:    Significant Relationships:  Spouse Lives with:  Spouse (Husband [name]  [phone]  or [phone] ) Do you feel safe going back to the place where you live?  Yes Need for family participation in patient care:  No (Coment)  Care giving concerns:  None listed by pt/family   Social Worker assessment / plan:  CSW met with pt and confirmed pt's plan to be discharged back home to live at discharge.  Pt has been living independently prior to discharge.  Employment status:    Insurance information:  Managed Care PT Recommendations:  Not assessed at this time Information / Referral to community resources:     Patient/Family's Response to care:  Patient alert and oriented.  Patient agreeable to plan to return home.  Pt's husband supportive and strongly involved in pt.'s care.  Pt pleasant and appreciated CSW intervention.    Patient/Family's Understanding of and Emotional Response to Diagnosis, Current Treatment, and Prognosis:  Still assessing   Emotional Assessment Appearance:  Appears stated age Attitude/Demeanor/Rapport:    Affect (typically observed):  Accepting, Adaptable, Calm, Pleasant Orientation:  Oriented to Self, Oriented to Place, Oriented to  Time, Oriented to Situation Alcohol / Substance use:    Psych involvement (Current  and /or in the community):     Discharge Needs  Concerns to be addressed:  No discharge needs identified Readmission within the last 30 days:  No Current discharge risk:  None Barriers to Discharge:  No Barriers Identified   Claudine Mouton, LCSWA 03/22/2016, 7:39 PM

## 2016-03-22 NOTE — ED Notes (Signed)
Spoke with pharmacy about medications pt would like to take now

## 2016-03-22 NOTE — Progress Notes (Signed)
PROGRESS NOTE    Misty Green  BDZ:[id number] DOB: [birth year] DOA: 03/21/2016 PCP: Alonza Bogus, MD     Brief Narrative:  HPI: Misty Green is a 46 y.o. female with medical history significant of COPD, chronic back pain 2/2 multiple back surgeries over the past 4 years, OSA on CPAP, reported postpartum cardiomyopathy, and tobacco abuse; who initially presents with complaints of being on pain medications for the last 4 years and now is addicted. She has undergone multiple back surgeries and reports living daily with excruciating back pain. Currently taking oxycodone 20 mg up to 4 times daily, and still has no relief of back pain. In the middle of last month patient notes that she underwent a nerve block, but reports that it was unsuccessful.  She even admits to taking more medication than what she is prescribed, and still not getting relief of symptoms. She has been referred to pain clinic but unable to get an appointment yet. On admission to the emergency department patient was seen to be afebrile, pulse 78-101, respirations 15-24, O2 saturation as low as 75% on room air.   Assessment & Plan:   Principal Problem:   Acute respiratory failure (HCC) Active Problems:   COPD exacerbation ([facility])   Chronic back pain   Opioid type dependence, abuse ([place])   Polycythemia   Anxiety   OSA on CPAP  Acute hypoxemic respiratory failure secondary to COPD exacerbation -Required BiPAP in the emergency department. Currently on nasal cannula O2 -Solu-Medrol -Albuterol q2h prn  -DuoNeb and Brovana, Pulmicort -Doxycycline   Chronic back pain and opioid dependence/abuse -Has appointment with neurosurgeon on 3/8  -Has previously been referred to pain clinic, but hasn't heard back yet regarding referral -Continue home pain regimen, oxycodone, gabapentin -Limited IV narcotics   Polycythemia -Suspect secondary to OSA -Monitor  OSA -CPAP at nighttime  Anxiety -Continue home xanax   DVT  prophylaxis: lovenox Code Status: full Family Communication: no family at bedside Disposition Plan: pending further improvement in respiratory status   Consultants:   None  Procedures:   None  Antimicrobials:   Doxycycline     Subjective: Patient complains of her chronic back pain. This has been an issue for her over the past 3-4 years. She has been becoming increasingly tolerant to opioids and believes that she is addicted to them. She recently saw neurosx in office and they did a nerve block which did not help. She has an appointment to see them again on 3/8. Over the past couple of weeks, patient has taken more of her narcotics than as prescribed. She states that she has been very tired and fatigued at home sleeping constantly. She states that she noted her pulse ox in her in the 60s, wasn't sure what was than normal. She denies any fevers or chills, no chest pain but does have some shortness of breath and productive cough of white/clear sputum. She also admits to nausea and vomiting. No abdominal pain. She also denies dysuria.   Objective: Vitals:   03/22/16 1142 03/22/16 1145 03/22/16 1200 03/22/16 1239  BP: 120/92  119/88 129/78  Pulse: 76 73 80 74  Resp: 19 18 15 13   Temp:      TempSrc:      SpO2: 98% 96% 95% 97%   No intake or output data in the 24 hours ending 03/22/16 1322 There were no vitals filed for this visit.  Examination:  General exam: Appears calm and comfortable but tearful  Respiratory system: Currently on  nasal cannula O2, no conversational dyspnea, wheezing bilaterally Cardiovascular system: S1 & S2 heard, RRR. No JVD, murmurs, rubs, gallops or clicks. No pedal edema. Gastrointestinal system: Abdomen is nondistended, soft and nontender. No organomegaly or masses felt. Normal bowel sounds heard. Central nervous system: Alert and oriented. No focal neurological deficits. Extremities: Symmetric 5 x 5 power. Skin: No rashes, lesions or ulcers Psychiatry:  Judgement and insight appear normal. Mood & affect depressed.   Data Reviewed: I have personally reviewed following labs and imaging studies  CBC:  Recent Labs Lab 03/21/16 2109 03/22/16 0510  WBC 8.5 6.1  NEUTROABS 5.9  --   HGB 18.9* 18.3*  HCT 59.1* 57.6*  MCV 103.7* 102.9*  PLT 166 381   Basic Metabolic Panel:  Recent Labs Lab 03/21/16 2109 03/22/16 0510  NA 138 140  K 3.9 4.2  CL 98* 99*  CO2 34* 30  GLUCOSE 109* 145*  BUN 8 8  CREATININE 0.54 0.39*  CALCIUM 9.2 9.3   GFR: CrCl cannot be calculated (Unknown ideal weight.). Liver Function Tests:  Recent Labs Lab 03/22/16 0510  AST 17  ALT 14  ALKPHOS 49  BILITOT 0.5  PROT 6.8  ALBUMIN 3.4*   No results for input(s): LIPASE, AMYLASE in the last 168 hours. No results for input(s): AMMONIA in the last 168 hours. Coagulation Profile: No results for input(s): INR, PROTIME in the last 168 hours. Cardiac Enzymes: No results for input(s): CKTOTAL, CKMB, CKMBINDEX, TROPONINI in the last 168 hours. BNP (last 3 results) No results for input(s): PROBNP in the last 8760 hours. HbA1C: No results for input(s): HGBA1C in the last 72 hours. CBG: No results for input(s): GLUCAP in the last 168 hours. Lipid Profile: No results for input(s): CHOL, HDL, LDLCALC, TRIG, CHOLHDL, LDLDIRECT in the last 72 hours. Thyroid Function Tests: No results for input(s): TSH, T4TOTAL, FREET4, T3FREE, THYROIDAB in the last 72 hours. Anemia Panel:  Recent Labs  03/22/16 0512  VITAMINB12 869   Sepsis Labs: No results for input(s): PROCALCITON, LATICACIDVEN in the last 168 hours.  No results found for this or any previous visit (from the past 240 hour(s)).     Radiology Studies: Dg Chest 2 View  Result Date: 03/21/2016 CLINICAL DATA:  Shortness of breath today. History of COPD and CHF 2 years ago. Smoker. EXAM: CHEST  2 VIEW COMPARISON:  Chest x-ray dated 01/23/2013. FINDINGS: Heart size and mediastinal contours are stable.  Lungs are hyperexpanded. Coarse interstitial markings are seen bilaterally, slightly progressed in the interval, suggesting chronic interstitial lung disease. Pulmonary granulomas bilaterally. No pleural effusion or pneumothorax seen. No acute or suspicious osseous finding. IMPRESSION: 1. Hyperexpanded lungs indicating COPD. Associated chronic interstitial lung disease. 2. No acute findings.  No evidence of pneumonia or pulmonary edema. Electronically Signed   By: Franki Cabot M.D.   On: 03/21/2016 19:26      Scheduled Meds: . arformoterol  15 mcg Nebulization BID  . budesonide (PULMICORT) nebulizer solution  0.5 mg Nebulization BID  . docusate sodium  300 mg Oral QHS  . gabapentin  300 mg Oral TID  . guaiFENesin  600 mg Oral BID  . ipratropium-albuterol  3 mL Nebulization BID  . methylPREDNISolone (SOLU-MEDROL) injection  60 mg Intravenous Q8H  . multivitamin with minerals  1 tablet Oral Daily  . roflumilast  500 mcg Oral Daily   Continuous Infusions: . sodium chloride 20 mL/hr at 03/21/16 2114     LOS: 0 days    Time spent: 40  minutes   Dessa Phi, DO [facility] [email or web address] Password [email or web address] 03/22/2016, 1:22 PM

## 2016-03-22 NOTE — ED Notes (Signed)
To summarize pt has been tearful, she reports severe chronic back pain that has been getting worse for several months.  She states that her pain medications are not working at all any more and that she takes too many and that she feels that she is addicted to them.  Family member expresses concerns stating that pt abuses prescription meds crushing and snorting them. She states that she was told to come here to get help getting into rehab to get off of them.  Pt also reports sob and states that her spo2 has been 69% at home but she was not entirely sure what that meant or the significance of that.  Pt is on CPAP at home and the machine gives her 2.5-3L O2 while she sleeps.  Pt is not on home o2 during waking hours.  Pt continues to be a heavy smoker and family members also smoke.  Pt spo2 is in the mid 55s on RA and pt is still able to speak in full sentences when her spo2 is at that level.  Pt reports generalized weakness and has some visible shakiness.  Pt spo2 improves to mid to high 80's on 3L [place] and pt states that she feels better.  Pt is 88-91% on CPAP (3L).  Pt has continued to be tearful at times, restless and reports severe back pain, discomfort.  She expressed wanting all her usual meds which I got for her.  She is in a hospital bed for comfort.

## 2016-03-22 NOTE — ED Notes (Signed)
Writer ambulated with pt, at 4 L/M in the hallway.

## 2016-03-23 LAB — CBC WITH DIFFERENTIAL/PLATELET
BASOS PCT: 0 %
Basophils Absolute: 0 10*3/uL (ref 0.0–0.1)
Eosinophils Absolute: 0 10*3/uL (ref 0.0–0.7)
Eosinophils Relative: 0 %
HEMATOCRIT: 57.2 % — AB (ref 36.0–46.0)
Hemoglobin: 18.1 g/dL — ABNORMAL HIGH (ref 12.0–15.0)
Lymphocytes Relative: 22 %
Lymphs Abs: 1.6 10*3/uL (ref 0.7–4.0)
MCH: 32.7 pg (ref 26.0–34.0)
MCHC: 31.6 g/dL (ref 30.0–36.0)
MCV: 103.4 fL — ABNORMAL HIGH (ref 78.0–100.0)
MONO ABS: 0.6 10*3/uL (ref 0.1–1.0)
MONOS PCT: 8 %
NEUTROS ABS: 5.3 10*3/uL (ref 1.7–7.7)
Neutrophils Relative %: 71 %
Platelets: 181 10*3/uL (ref 150–400)
RBC: 5.53 MIL/uL — ABNORMAL HIGH (ref 3.87–5.11)
RDW: 13.7 % (ref 11.5–15.5)
WBC: 7.5 10*3/uL (ref 4.0–10.5)

## 2016-03-23 LAB — BASIC METABOLIC PANEL
Anion gap: 4 — ABNORMAL LOW (ref 5–15)
BUN: 10 mg/dL (ref 6–20)
CALCIUM: 9.3 mg/dL (ref 8.9–10.3)
CO2: 37 mmol/L — AB (ref 22–32)
CREATININE: 0.57 mg/dL (ref 0.44–1.00)
Chloride: 99 mmol/L — ABNORMAL LOW (ref 101–111)
GFR calc non Af Amer: >60 mL/min (ref >60)
GLUCOSE: 108 mg/dL — AB (ref 65–99)
Potassium: 4.2 mmol/L (ref 3.5–5.1)
Sodium: 140 mmol/L (ref 135–145)

## 2016-03-23 LAB — TROPONIN I: Troponin I: <0.03 ng/mL (ref <0.03)

## 2016-03-23 LAB — HIV ANTIBODY (ROUTINE TESTING W REFLEX): HIV SCREEN 4TH GENERATION: NONREACTIVE

## 2016-03-23 LAB — FOLATE RBC
Folate, RBC: >1125 ng/mL (ref >498)
HEMATOCRIT: 55.1 % — AB (ref 34.0–46.6)

## 2016-03-23 MED ORDER — PREDNISONE 50 MG PO TABS
50.0000 mg | ORAL_TABLET | Freq: Every day | ORAL | Status: DC
  Administered 2016-03-24: 50 mg via ORAL
  Filled 2016-03-23: qty 1

## 2016-03-23 MED ORDER — MUSCLE RUB 10-15 % EX CREA
TOPICAL_CREAM | CUTANEOUS | Status: DC | PRN
  Administered 2016-03-23: 21:00:00 via TOPICAL
  Filled 2016-03-23: qty 85

## 2016-03-23 NOTE — Discharge Planning (Signed)
COPD protocol depression screening.  LCSWA met with patient at beside, explained role and PHQ-9 depression screening intervention. Patient agreeable and scored 3. Patient reports she has depression and sees an outpatient doctor that assist with her medications. Patient reports over the past two weeks she has less interest in doing things because of her back pain. Patient reports she sometimes has has trouble falling asleep because of the back pain.  Patient denies feeling tired or having little energy and felling bad about herself, a change in moving slowly or thoughts about hurting self. Patient feels she has great support system from her husband and other relatives. The patient reports her mother died from COPD three years ago. She was able to take care of her mother and learn a lot about COPD that she now has.  The patient was appreciative of CSW support and time. Depression screen pt. on chart.

## 2016-03-23 NOTE — Evaluation (Signed)
Physical Therapy Evaluation Patient Details Name: Misty Green MRN: [id number] DOB: March 14, 1970 Today's Date: 03/23/2016   History of Present Illness  This 46 year old female was admitted for acute respiratory therapy; she reports she feels addicted to pain medication.  PMH:  chronic back pain, s/p sxs, COPD  Clinical Impression  The patient was limited in ambulation but had previously ambulated down hall and past the elevators. sats on RA 87%, with 4 liters >92%. Pt admitted with above diagnosis. Pt currently with functional limitations due to the deficits listed below (see PT Problem List).  Pt will benefit from skilled PT to increase their independence and safety with mobility to allow discharge to the venue listed below.       Follow Up Recommendations  none    Equipment Recommendations    none   Recommendations for Other Services       Precautions / Restrictions Precautions Precaution Comments: monitor sats      Mobility  Bed Mobility Overal bed mobility: Modified Independent             General bed mobility comments: with HOB raised. Pt reports she goes to and from sidelying from home  Transfers Overall transfer level: Needs assistance Equipment used: Rolling walker (2 wheeled) Transfers: Sit to/from Stand Sit to Stand: Min guard         General transfer comment: needed min A with cane; much steadier with RW  Ambulation/Gait Ambulation/Gait assistance: Min assist Ambulation Distance (Feet): 80 Feet Assistive device: Rolling walker (2 wheeled) Gait Pattern/deviations: Step-to pattern;Antalgic     General Gait Details: started with cane but requested RW due to weakness and pain.  Stairs            Wheelchair Mobility    Modified Rankin (Stroke Patients Only)       Balance                                             Pertinent Vitals/Pain Pain Assessment: Faces Faces Pain Scale: Hurts whole lot Pain Location:  back Pain Descriptors / Indicators: Aching Pain Intervention(s): Premedicated before session    Home Living Family/patient expects to be discharged to:: Private residence Living Arrangements: Spouse/significant other Available Help at Discharge: Family Type of Home: House Home Access: Stairs to enter Entrance Stairs-Rails: Psychiatric nurse of Steps: 3 Home Layout: One level Home Equipment: Environmental consultant - 2 wheels;Cane - single point      Prior Function                 Hand Dominance        Extremity/Trunk Assessment   Upper Extremity Assessment Upper Extremity Assessment: Defer to OT evaluation    Lower Extremity Assessment Lower Extremity Assessment: LLE deficits/detail LLE Deficits / Details: noted left leg buckling at times       Communication      Cognition Arousal/Alertness: Awake/alert Behavior During Therapy: WFL for tasks assessed/performed Overall Cognitive Status: Within Functional Limits for tasks assessed                      General Comments General comments (skin integrity, edema, etc.): sats 92% 4 liters, 87% without 02, 90-91% back on 4 liters    Exercises     Assessment/Plan    PT Assessment Patient needs continued PT services  PT Problem List  PT Treatment Interventions      PT Goals (Current goals can be found in the Care Plan section)  Acute Rehab PT Goals Patient Stated Goal: less pain PT Goal Formulation: With patient Time For Goal Achievement: 03/30/16 Potential to Achieve Goals: Good    Frequency     Barriers to discharge        Co-evaluation PT/OT/SLP Co-Evaluation/Treatment: Yes Reason for Co-Treatment: For patient/therapist safety PT goals addressed during session: Mobility/safety with mobility OT goals addressed during session: ADL's and self-care       End of Session   Activity Tolerance: Patient limited by pain Patient left: in bed;with call bell/phone within reach;with  family/visitor present Nurse Communication: Mobility status PT Visit Diagnosis: Pain;Other abnormalities of gait and mobility (R26.89) Pain - Right/Left: Left Pain - part of body: Leg    Functional Assessment Tool Used: AM-PAC 6 Clicks Basic Mobility    Time: 8337-4451 PT Time Calculation (min) (ACUTE ONLY): 20 min   Charges:   PT Evaluation $PT Eval Low Complexity: 1 Procedure     PT G Codes:   PT G-Codes **NOT FOR INPATIENT CLASS** Functional Assessment Tool Used: AM-PAC 6 Clicks Basic Mobility     Claretha Cooper 03/23/2016, 3:28 PM Tresa Endo PT [phone]

## 2016-03-23 NOTE — Progress Notes (Signed)
OT Cancellation Note  Patient Details Name: Misty Green MRN: [id number] DOB: Oct 14, 1970   Cancelled Treatment:    Reason Eval/Treat Not Completed: Other (comment).  Spoke to [employer].  Pt continues to have pain.  Will check back tomorrow.  Maikel Neisler 03/23/2016, 1:50 PM  Lesle Chris, OTR/L [phone] 03/23/2016

## 2016-03-23 NOTE — Evaluation (Signed)
Occupational Therapy Evaluation Patient Details Name: JHAYLA PODGORSKI MRN: [id number] DOB: 07/19/1970 Today's Date: 03/23/2016    History of Present Illness This 46 year old female was admitted for acute respiratory therapy; she reports she feels addicted to pain medication.  PMH:  chronic back pain, s/p sxs, COPD   Clinical Impression   Pt was admitted for the above. She will benefit from continued OT to increase safety and independence with adls as well as further educating her on energy conservation.  Goals are for supervision level. Pt has not been tolerating shower prior to admission due to pain and breathing; she was mostly mod I with adls.    Follow Up Recommendations  No OT follow up    Equipment Recommendations   (pt may need 02)    Recommendations for Other Services       Precautions / Restrictions        Mobility Bed Mobility Overal bed mobility: Modified Independent             General bed mobility comments: with HOB raised. Pt reports she goes to and from sidelying from home  Transfers Overall transfer level: Needs assistance Equipment used: Rolling walker (2 wheeled) Transfers: Sit to/from Stand Sit to Stand: Min guard         General transfer comment: needed min A with cane; much steadier with RW    Balance                                            ADL Overall ADL's : Needs assistance/impaired     Grooming: Set up;Sitting   Upper Body Bathing: Set up;Sitting   Lower Body Bathing: Min guard;Sit to/from stand   Upper Body Dressing : Set up;Sitting   Lower Body Dressing: Min guard;Sit to/from stand   Toilet Transfer: Min Marine scientist Details (indicate cue type and reason): back to bed Toileting- Water quality scientist and Hygiene: Min guard;Sit to/from stand         General ADL Comments: pt much steadier with RW instead of cane. She hasn't been able to shower due to pain/breathing. She does  have a BSC at home and crosses legs for adls to minimize back pain     Vision         Perception     Praxis      Pertinent Vitals/Pain Pain Assessment: Faces Faces Pain Scale: Hurts whole lot Pain Location: back Pain Descriptors / Indicators: Aching Pain Intervention(s): Limited activity within patient's tolerance;Monitored during session;Premedicated before session;Repositioned;Heat applied     Hand Dominance     Extremity/Trunk Assessment Upper Extremity Assessment Upper Extremity Assessment: Overall WFL for tasks assessed           Communication     Cognition Arousal/Alertness: Awake/alert Behavior During Therapy: WFL for tasks assessed/performed Overall Cognitive Status: Within Functional Limits for tasks assessed                     General Comments  sats 92% 4 liters, 87% without 02, 90-91% back on 4 liters    Exercises       Shoulder Instructions      Home Living  Prior Functioning/Environment                   OT Problem List: Decreased activity tolerance;Pain;Cardiopulmonary status limiting activity      OT Treatment/Interventions: Self-care/ADL training;DME and/or AE instruction;Therapeutic activities;Balance training;Patient/family education;Energy conservation    OT Goals(Current goals can be found in the care plan section) Acute Rehab OT Goals Patient Stated Goal: less pain OT Goal Formulation: With patient Time For Goal Achievement: 03/30/16 Potential to Achieve Goals: Good ADL Goals Pt Will Transfer to Toilet: with supervision;ambulating;bedside commode (all aspects) Pt Will Perform Tub/Shower Transfer: Shower transfer;with supervision;ambulating;shower seat;3 in 1 Additional ADL Goal #1: pt will demonstrate pursed lip breathing when she has dyspnea and will initiate at least one rest break for energy conservation  OT Frequency: Min 2X/week   Barriers to D/C:             Co-evaluation PT/OT/SLP Co-Evaluation/Treatment: Yes Reason for Co-Treatment: For patient/therapist safety PT goals addressed during session: Mobility/safety with mobility OT goals addressed during session: ADL's and self-care      End of Session    Activity Tolerance: Other (comment) (worked through pain) Patient left: in bed;with family/visitor present  OT Visit Diagnosis: Unsteadiness on feet (R26.81)                ADL either performed or assessed with clinical judgement  Time: 2841-3244 OT Time Calculation (min): 20 min Charges:  OT General Charges $OT Visit: 1 Procedure OT Evaluation $OT Eval Low Complexity: 1 Procedure G-Codes:     [place], OTR/L [phone] 03/23/2016  Johnathon Mittal 03/23/2016, 3:12 PM

## 2016-03-23 NOTE — Progress Notes (Signed)
PT Cancellation Note  Patient Details Name: Misty Green MRN: [id number] DOB: Dec 03, 1970   Cancelled Treatment:    Reason Eval/Treat Not Completed: Pain limiting ability to participate, co pain on left lateral chest below breast. RN aware. Unable to ambulate at this time. Check back later today.   Claretha Cooper 03/23/2016, 10:18 AM Tresa Endo PT [phone]

## 2016-03-23 NOTE — Progress Notes (Signed)
PROGRESS NOTE    Misty Green  LGX:[id number] DOB: [birth year] DOA: 03/21/2016 PCP: Alonza Bogus, MD     Brief Narrative:  HPI: Misty Green is a 46 y.o. female with medical history significant of COPD, chronic back pain 2/2 multiple back surgeries over the past 4 years, OSA on CPAP, reported postpartum cardiomyopathy, and tobacco abuse; who initially presents with complaints of being on pain medications for the last 4 years and now is addicted. She has undergone multiple back surgeries and reports living daily with excruciating back pain. Currently taking oxycodone 20 mg up to 4 times daily, and still has no relief of back pain. In the middle of last month patient notes that she underwent a nerve block, but reports that it was unsuccessful.  She even admits to taking more medication than what she is prescribed, and still not getting relief of symptoms. She has been referred to pain clinic but unable to get an appointment yet. On admission to the emergency department patient was seen to be afebrile, pulse 78-101, respirations 15-24, O2 saturation as low as 75% on room air. She was admitted due to acute hypoxemic respiratory failure, COPD exacerbation.   Assessment & Plan:   Principal Problem:   Acute respiratory failure (HCC) Active Problems:   COPD exacerbation (HCC)   Chronic back pain   Opioid type dependence, abuse ([place])   Polycythemia   Anxiety   OSA on CPAP  Acute hypoxemic respiratory failure secondary to COPD exacerbation -Required BiPAP in the emergency department. Currently on nasal cannula O2  -Solu-Medrol --> deescalate to PO prednisone today  -Albuterol q2h prn  -DuoNeb and Brovana, Pulmicort -Doxycycline   Atypical chest pain -EKG with NSR, PAC, RAD, J-point elevation  -Troponin negative, trend -Echo ordered   Chronic back pain and opioid dependence/abuse -Has appointment with neurosurgeon on 3/8  -Has previously been referred to pain clinic, but hasn't  heard back yet regarding referral -Continue home pain regimen, oxycodone, gabapentin -Limited IV narcotics   Polycythemia -Suspect secondary to OSA -Monitor  OSA -CPAP at nighttime  Anxiety -Continue home xanax   DVT prophylaxis: lovenox Code Status: full Family Communication: family at bedside Disposition Plan: pending further improvement in respiratory status   Consultants:   None  Procedures:   None  Antimicrobials:   Doxycycline     Subjective: Breathing seems a bit improved. Complains today of left lateral chest pain. She states that she has had this chest pain about 2 weeks ago, which lasted about 15 minutes before resolving spontaneously. This morning about 7 AM, chest pain returned. She states that it starts on the lateral chest wall and radiates under her left breast. This is not reproducible to palpation. She describes as a pressure, constant pain   Objective: Vitals:   03/22/16 2115 03/22/16 2331 03/23/16 0540 03/23/16 0911  BP: 110/65  123/68   Pulse: 87 75 80   Resp: 18 18 18    Temp: 98.4 F (36.9 C)  97.9 F (36.6 C)   TempSrc: Oral  Oral   SpO2: 90% 93% 94% 94%  Weight:      Height:        Intake/Output Summary (Last 24 hours) at 03/23/16 1246 Last data filed at 03/23/16 0943  Gross per 24 hour  Intake              880 ml  Output                0 ml  Net  880 ml   Filed Weights   03/22/16 1930  Weight: 64 kg (141 lb)    Examination:  General exam: Appears calm but tearful  Respiratory system: Currently on nasal cannula O2, no conversational dyspnea, diminished breath sounds bilaterally Cardiovascular system: S1 & S2 heard, RRR. No JVD, murmurs, rubs, gallops or clicks. No pedal edema. Gastrointestinal system: Abdomen is nondistended, soft and nontender. No organomegaly or masses felt. Normal bowel sounds heard. Central nervous system: Alert and oriented. No focal neurological deficits. Extremities: Symmetric 5 x 5  power. Skin: No rashes, lesions or ulcers Psychiatry: Judgement and insight appear normal. Mood & affect depressed.   Data Reviewed: I have personally reviewed following labs and imaging studies  CBC:  Recent Labs Lab 03/21/16 2109 03/22/16 0510 03/23/16 0658  WBC 8.5 6.1 7.5  NEUTROABS 5.9  --  5.3  HGB 18.9* 18.3* 18.1*  HCT 59.1* 57.6* 57.2*  MCV 103.7* 102.9* 103.4*  PLT 166 173 716   Basic Metabolic Panel:  Recent Labs Lab 03/21/16 2109 03/22/16 0510 03/23/16 0658  NA 138 140 140  K 3.9 4.2 4.2  CL 98* 99* 99*  CO2 34* 30 37*  GLUCOSE 109* 145* 108*  BUN 8 8 10   CREATININE 0.54 0.39* 0.57  CALCIUM 9.2 9.3 9.3   GFR: Estimated Creatinine Clearance: 75.9 mL/min (by C-G formula based on SCr of 0.57 mg/dL). Liver Function Tests:  Recent Labs Lab 03/22/16 0510  AST 17  ALT 14  ALKPHOS 49  BILITOT 0.5  PROT 6.8  ALBUMIN 3.4*   No results for input(s): LIPASE, AMYLASE in the last 168 hours. No results for input(s): AMMONIA in the last 168 hours. Coagulation Profile: No results for input(s): INR, PROTIME in the last 168 hours. Cardiac Enzymes:  Recent Labs Lab 03/23/16 1049  TROPONINI <0.03   BNP (last 3 results) No results for input(s): PROBNP in the last 8760 hours. HbA1C: No results for input(s): HGBA1C in the last 72 hours. CBG: No results for input(s): GLUCAP in the last 168 hours. Lipid Profile: No results for input(s): CHOL, HDL, LDLCALC, TRIG, CHOLHDL, LDLDIRECT in the last 72 hours. Thyroid Function Tests: No results for input(s): TSH, T4TOTAL, FREET4, T3FREE, THYROIDAB in the last 72 hours. Anemia Panel:  Recent Labs  03/22/16 0512  VITAMINB12 869   Sepsis Labs: No results for input(s): PROCALCITON, LATICACIDVEN in the last 168 hours.  No results found for this or any previous visit (from the past 240 hour(s)).     Radiology Studies: Dg Chest 2 View  Result Date: 03/21/2016 CLINICAL DATA:  Shortness of breath today. History  of COPD and CHF 2 years ago. Smoker. EXAM: CHEST  2 VIEW COMPARISON:  Chest x-ray dated 01/23/2013. FINDINGS: Heart size and mediastinal contours are stable. Lungs are hyperexpanded. Coarse interstitial markings are seen bilaterally, slightly progressed in the interval, suggesting chronic interstitial lung disease. Pulmonary granulomas bilaterally. No pleural effusion or pneumothorax seen. No acute or suspicious osseous finding. IMPRESSION: 1. Hyperexpanded lungs indicating COPD. Associated chronic interstitial lung disease. 2. No acute findings.  No evidence of pneumonia or pulmonary edema. Electronically Signed   By: Franki Cabot M.D.   On: 03/21/2016 19:26      Scheduled Meds: . arformoterol  15 mcg Nebulization BID  . budesonide (PULMICORT) nebulizer solution  0.5 mg Nebulization BID  . docusate sodium  300 mg Oral QHS  . doxycycline  100 mg Oral Q12H  . enoxaparin (LOVENOX) injection  40 mg Subcutaneous Q24H  .  gabapentin  300 mg Oral TID  . guaiFENesin  600 mg Oral BID  . ipratropium-albuterol  3 mL Nebulization TID  . multivitamin with minerals  1 tablet Oral Daily  . [START ON 03/24/2016] predniSONE  50 mg Oral Q breakfast  . roflumilast  500 mcg Oral Daily  . sodium chloride flush  3 mL Intravenous Q12H   Continuous Infusions:    LOS: 1 day    Time spent: 30 minutes   Dessa Phi, DO [facility] [email or web address] Password [email or web address] 03/23/2016, 12:46 PM

## 2016-03-24 ENCOUNTER — Encounter (HOSPITAL_COMMUNITY): Payer: Self-pay

## 2016-03-24 ENCOUNTER — Inpatient Hospital Stay (HOSPITAL_COMMUNITY): Payer: 59

## 2016-03-24 DIAGNOSIS — R079 Chest pain, unspecified: Secondary | ICD-10-CM

## 2016-03-24 LAB — BASIC METABOLIC PANEL
ANION GAP: 9 (ref 5–15)
BUN: 16 mg/dL (ref 6–20)
CALCIUM: 9.3 mg/dL (ref 8.9–10.3)
CHLORIDE: 100 mmol/L — AB (ref 101–111)
CO2: 32 mmol/L (ref 22–32)
CREATININE: 0.55 mg/dL (ref 0.44–1.00)
GFR calc non Af Amer: >60 mL/min (ref >60)
Glucose, Bld: 83 mg/dL (ref 65–99)
Potassium: 4 mmol/L (ref 3.5–5.1)
SODIUM: 141 mmol/L (ref 135–145)

## 2016-03-24 LAB — CBC WITH DIFFERENTIAL/PLATELET
BASOS ABS: 0.1 10*3/uL (ref 0.0–0.1)
BASOS PCT: 1 %
EOS ABS: 0.2 10*3/uL (ref 0.0–0.7)
Eosinophils Relative: 2 %
HCT: 59.1 % — ABNORMAL HIGH (ref 36.0–46.0)
HEMOGLOBIN: 18.3 g/dL — AB (ref 12.0–15.0)
Lymphocytes Relative: 34 %
Lymphs Abs: 2.5 10*3/uL (ref 0.7–4.0)
MCH: 32.2 pg (ref 26.0–34.0)
MCHC: 31 g/dL (ref 30.0–36.0)
MCV: 103.9 fL — ABNORMAL HIGH (ref 78.0–100.0)
MONOS PCT: 9 %
Monocytes Absolute: 0.7 10*3/uL (ref 0.1–1.0)
NEUTROS ABS: 4.1 10*3/uL (ref 1.7–7.7)
NEUTROS PCT: 55 %
Platelets: 175 10*3/uL (ref 150–400)
RBC: 5.69 MIL/uL — ABNORMAL HIGH (ref 3.87–5.11)
RDW: 13.6 % (ref 11.5–15.5)
WBC: 7.4 10*3/uL (ref 4.0–10.5)

## 2016-03-24 LAB — ECHOCARDIOGRAM COMPLETE
HEIGHTINCHES: 64 in
WEIGHTICAEL: 2256 [oz_av]

## 2016-03-24 MED ORDER — PREDNISONE 10 MG PO TABS: ORAL_TABLET | ORAL | 0 refills | Status: DC

## 2016-03-24 MED ORDER — KETOROLAC TROMETHAMINE 30 MG/ML IJ SOLN
30.0000 mg | Freq: Once | INTRAMUSCULAR | Status: AC
  Administered 2016-03-24: 30 mg via INTRAVENOUS

## 2016-03-24 MED ORDER — LORAZEPAM 2 MG/ML IJ SOLN
0.5000 mg | Freq: Once | INTRAMUSCULAR | Status: AC
  Administered 2016-03-24: 0.5 mg via INTRAVENOUS
  Filled 2016-03-24: qty 1

## 2016-03-24 MED ORDER — HYDROMORPHONE HCL 1 MG/ML IJ SOLN
0.5000 mg | Freq: Once | INTRAMUSCULAR | Status: AC
  Administered 2016-03-24: 0.5 mg via INTRAVENOUS

## 2016-03-24 MED ORDER — HYDROMORPHONE HCL 1 MG/ML IJ SOLN
0.5000 mg | Freq: Once | INTRAMUSCULAR | Status: AC
  Administered 2016-03-24: 0.5 mg via INTRAVENOUS
  Filled 2016-03-24: qty 0.5

## 2016-03-24 MED ORDER — DOXYCYCLINE HYCLATE 100 MG PO TABS: 100.0000 mg | ORAL_TABLET | Freq: Two times a day (BID) | ORAL | 0 refills | Status: AC

## 2016-03-24 NOTE — Progress Notes (Signed)
Pt discharged home with spouse in stable condition. Discharge instructions given. Scripts sent to pharmacy of choice. All questions and concerns addressed.

## 2016-03-24 NOTE — Discharge Instructions (Signed)

## 2016-03-24 NOTE — Progress Notes (Signed)
Pt ambulated in hall, oxygen saturation dropped to 82% on 4L [place]. Increased to 6L [place], oxygen maintained at or above 93% while ambulating. Placed the patient back on 4L at rest.

## 2016-03-24 NOTE — Progress Notes (Signed)
  Echocardiogram 2D Echocardiogram has been performed.  Misty Green M 03/24/2016, 12:47 PM

## 2016-03-24 NOTE — Progress Notes (Signed)
SATURATION QUALIFICATIONS: (This note is used to comply with regulatory documentation for home oxygen)  Patient Saturations on Room Air at Rest = 80%  Patient Saturations on Room Air while Ambulating =74 %  Patient Saturations on 6 Liters of oxygen while Ambulating = 96%  Please briefly explain why patient needs home oxygen:

## 2016-03-24 NOTE — Care Management Note (Signed)
Case Management Note  Patient Details  Name: Misty Green MRN: [id number] Date of Birth: [birth year]  Subjective/Objective:                  COPD Action/Plan: Discharge planning Expected Discharge Date:  03/24/16               Expected Discharge Plan:  Home/Self Care  In-House Referral:     Discharge planning Services  CM Consult  Post Acute Care Choice:    Choice offered to:  Patient, Spouse Aleeyah Bensen (spouse) [phone])  DME Arranged:  Oxygen DME Agency:  [place]. (CPAP supplied by [facility])  HH Arranged:  NA (Patient would COPD HH Program ) [place] Agency:  NA  Status of Service:  Completed, signed off  If discussed at [place] of Stay Meetings, dates discussed:    Additional Comments: CM met with pt in room to discuss home O2.  Pt states she gets CPAP from [facility] and "box " from [facility] but is very unhappy with services and now wants to use [facility] for home O2.  Referral called to [facility] rep, Joelene Millin.  No other CM needs were communicated. Dellie Catholic, RN 03/24/2016, 11:10 AM

## 2016-03-24 NOTE — Progress Notes (Signed)
  Echocardiogram 2D Echocardiogram has been performed.  Darlina Sicilian M 03/24/2016, 12:47 PM

## 2016-03-24 NOTE — Discharge Summary (Addendum)
Physician Discharge Summary  Misty Green HEN:[id number] DOB: 07-Jul-1970 DOA: 03/21/2016  PCP: Alonza Bogus, MD  Admit date: 03/21/2016 Discharge date: 03/24/2016  Admitted From: Home Disposition:  Home  Recommendations for Outpatient Follow-up:  1. Follow up with PCP in 1 week 2. Follow up regarding your pain clinic referral 3. Follow up with your neurosurgery team as scheduled  4. Please obtain CBC in 1 week    Home Health: No  Equipment/Devices: home O2   Discharge Condition: Stable CODE STATUS: Full  Diet recommendation: Heart healthy   Brief/Interim Summary: From H&P: Misty Lewellyn Szychowiczis a 46 y.o.femalewith medical history significant of COPD, chronic back pain 2/2 multiple back surgeries over the past 4 years, OSA on CPAP, reported postpartum cardiomyopathy, and tobacco abuse;who initially presents with complaints of being on pain medicationsfor the last 4 years and now is addicted. She has undergone multiple back surgeries and reports living daily with excruciating back pain. Currently taking oxycodone 20 mg up to 4 times daily, and still has no relief of back pain. In the middle of last month patient notes that she underwent a nerve block, but reports that it was unsuccessful. She even admits to taking more medication than what she is prescribed, and still not getting relief of symptoms. She has been referred to pain clinic but unable to get an appointment yet. On admission to the emergency department patient was seen to be afebrile, pulse 78-101, respirations 15-24, O2 saturation as low as 75% on room air. She was admitted due to acute hypoxemic respiratory failure, COPD exacerbation and required bipap for a short period of time in the ED.  Interim: She was weaned off bipap fairly quickly in the ED and was transitioned to nasal cannula O2. She did not require bipap again during the admission. Her breathing continued to improve. On morning of 3/6, she complained of left  sided chest pain. Troponin was negative and EKG unremarkable. Chest pain resolved, then returned, then resolved again spontaneously. Echocardiogram was unremarkable with no wall motion abnormalities. Due to her chronic back pain and her appointment to see her neurosurgery team in [place] on 3/8, she is discharged home with home O2 and to follow up with them regarding her severe chronic back pain. She is also encouraged to quit smoking, to follow up with PCP, and to check up on her pending pain clinic referral. No new narcotic prescription is provided at discharge.    Discharge Diagnoses:  Principal Problem:   Acute respiratory failure ([place]) Active Problems:   COPD exacerbation (HCC)   Chronic back pain   Opioid type dependence, abuse ([place])   Polycythemia   Anxiety   OSA on CPAP  Acute hypoxemic respiratory failure secondary to COPD exacerbation -Required BiPAP in the emergency department. Currently on nasal cannula O2. Set up for home O2 at discharge.  -Continue prednisone taper at discharge  -Albuterol q2h prn  -DuoNeb and Brovana, Pulmicort -Doxycycline   Atypical chest pain -EKG with NSR, PAC, RAD, J-point elevation  -Troponin negative  -Echo EF 60-65%, no wall abnormalities  Chronic back pain and opioid dependence/abuse -Has appointment with neurosurgeon on 3/8  -Has previously been referred to pain clinic, but hasn't heard back yet regarding referral -Continue home pain regimen, oxycodone, gabapentin -Limited narcotics   Polycythemia -Suspect secondary to OSA -Monitor  OSA -CPAP at nighttime  Anxiety -Continue home xanax   Tobacco abuse -During hospitalization, snuck into another unit's bathroom to smoke    Discharge Instructions  Discharge Instructions  Diet - low sodium heart healthy    Complete by:  As directed    Increase activity slowly    Complete by:  As directed      Allergies as of 03/24/2016      Reactions   Other Nausea And Vomiting    Anti-depressent that starts with t      Medication List    TAKE these medications   albuterol 108 (90 Base) MCG/ACT inhaler Commonly known as:  PROVENTIL HFA;VENTOLIN HFA Inhale into the lungs every 6 (six) hours as needed for wheezing or shortness of breath. What changed:  Another medication with the same name was removed. Continue taking this medication, and follow the directions you see here.   ALPRAZolam 1 MG tablet Commonly known as:  XANAX Take 1 mg by mouth 4 (four) times daily.   diphenhydramine-acetaminophen 25-500 MG Tabs tablet Commonly known as:  TYLENOL PM Take 2 tablets by mouth at bedtime.   docusate sodium 100 MG capsule Commonly known as:  COLACE Take 300 mg by mouth at bedtime.   doxycycline 100 MG tablet Commonly known as:  VIBRA-TABS Take 1 tablet (100 mg total) by mouth every 12 (twelve) hours.   Fluticasone-Salmeterol 250-50 MCG/DOSE Aepb Commonly known as:  ADVAIR Inhale 1 puff into the lungs 2 (two) times daily.   furosemide 40 MG tablet Commonly known as:  LASIX Take 40 mg by mouth daily as needed for fluid.   gabapentin 300 MG capsule Commonly known as:  NEURONTIN Take 300 mg by mouth 3 (three) times daily.   multivitamin with minerals tablet Take 1 tablet by mouth daily.   Oxycodone HCl 20 MG Tabs Take 20 mg by mouth 4 (four) times daily as needed (pain). What changed:  Another medication with the same name was removed. Continue taking this medication, and follow the directions you see here.   predniSONE 10 MG tablet Commonly known as:  DELTASONE Take 4 tabs for 3 days, then 3 tabs for 3 days, then 2 tabs for 3 days, then 1 tab for 3 days, then 1/2 tab for 4 days.   promethazine 25 MG tablet Commonly known as:  PHENERGAN Take 1 tablet (25 mg total) by mouth every 6 (six) hours as needed for nausea or vomiting.   roflumilast 500 MCG Tabs tablet Commonly known as:  DALIRESP Take 500 mcg by mouth daily.   tiotropium 18 MCG inhalation  capsule Commonly known as:  SPIRIVA Place 18 mcg into inhaler and inhale daily.   vitamin C 1000 MG tablet Take 1,000 mg by mouth daily.   zolpidem 10 MG tablet Commonly known as:  AMBIEN Take 10 mg by mouth at bedtime as needed for sleep.            Durable Medical Equipment        Start     Ordered   03/24/16 1006  For home use only DME oxygen  Once    Question Answer Comment  Mode or (Route) Nasal cannula   Liters per Minute 2   Frequency Continuous (stationary and portable oxygen unit needed)   Oxygen conserving device Yes   Oxygen delivery system Gas      03/24/16 1005     Follow-up Information    HAWKINS,EDWARD L, MD. Schedule an appointment as soon as possible for a visit in 1 week(s).   Specialty:  Pulmonary Disease Why:  Follow up for echocardiogram results and COPD exacerbation follor up Contact information: [address]  [address] [place] [address] [phone]          Allergies  Allergen Reactions  . Other Nausea And Vomiting    Anti-depressent that starts with t    Consultations:  None   Procedures/Studies: Dg Chest 2 View  Result Date: 03/21/2016 CLINICAL DATA:  Shortness of breath today. History of COPD and CHF 2 years ago. Smoker. EXAM: CHEST  2 VIEW COMPARISON:  Chest x-ray dated 01/23/2013. FINDINGS: Heart size and mediastinal contours are stable. Lungs are hyperexpanded. Coarse interstitial markings are seen bilaterally, slightly progressed in the interval, suggesting chronic interstitial lung disease. Pulmonary granulomas bilaterally. No pleural effusion or pneumothorax seen. No acute or suspicious osseous finding. IMPRESSION: 1. Hyperexpanded lungs indicating COPD. Associated chronic interstitial lung disease. 2. No acute findings.  No evidence of pneumonia or pulmonary edema. Electronically Signed   By: Franki Cabot M.D.   On: 03/21/2016 19:26   Echo Study Conclusions  - Left ventricle: The cavity size was normal.  Systolic function was   normal. The estimated ejection fraction was in the range of 60%   to 65%. Wall motion was normal; there were no regional wall   motion abnormalities. Left ventricular diastolic function   parameters were normal. Doppler parameters are consistent with   both elevated ventricular end-diastolic filling pressure and   elevated left atrial filling pressure. - Atrial septum: No defect or patent foramen ovale was identified.   Discharge Exam: Vitals:   03/23/16 2110 03/24/16 0617  BP: 119/68 123/73  Pulse: 85 77  Resp: 18 18  Temp: 98.3 F (36.8 C) 98 F (36.7 C)   Vitals:   03/23/16 0911 03/23/16 1431 03/23/16 2110 03/24/16 0617  BP:  116/66 119/68 123/73  Pulse:  81 85 77  Resp:  18 18 18   Temp:  98.1 F (36.7 C) 98.3 F (36.8 C) 98 F (36.7 C)  TempSrc:  Oral Oral Oral  SpO2: 94% 96% 97% 96%  Weight:      Height:        General: Pt is alert, awake, not in acute distress  Cardiovascular: RRR, S1/S2 +, no rubs, no gallops Respiratory: diminished breath sounds, no wheezing, no rhonchi, no acute distress  Abdominal: Soft, NT, ND, bowel sounds + Extremities: no edema, no cyanosis    The results of significant diagnostics from this hospitalization (including imaging, microbiology, ancillary and laboratory) are listed below for reference.     Microbiology: No results found for this or any previous visit (from the past 240 hour(s)).   Labs: BNP (last 3 results)  Recent Labs  03/21/16 0130  BNP 68.1   Basic Metabolic Panel:  Recent Labs Lab 03/21/16 2109 03/22/16 0510 03/23/16 0658 03/24/16 0630  NA 138 140 140 141  K 3.9 4.2 4.2 4.0  CL 98* 99* 99* 100*  CO2 34* 30 37* 32  GLUCOSE 109* 145* 108* 83  BUN 8 8 10 16   CREATININE 0.54 0.39* 0.57 0.55  CALCIUM 9.2 9.3 9.3 9.3   Liver Function Tests:  Recent Labs Lab 03/22/16 0510  AST 17  ALT 14  ALKPHOS 49  BILITOT 0.5  PROT 6.8  ALBUMIN 3.4*   No results for input(s):  LIPASE, AMYLASE in the last 168 hours. No results for input(s): AMMONIA in the last 168 hours. CBC:  Recent Labs Lab 03/21/16 2109 03/22/16 0510 03/22/16 0555 03/23/16 0658 03/24/16 0630  WBC 8.5 6.1  --  7.5 7.4  NEUTROABS 5.9  --   --  5.3 4.1  HGB 18.9* 18.3*  --  18.1* 18.3*  HCT 59.1* 57.6* 55.1* 57.2* 59.1*  MCV 103.7* 102.9*  --  103.4* 103.9*  PLT 166 173  --  181 175   Cardiac Enzymes:  Recent Labs Lab 03/23/16 1049 03/23/16 1932  TROPONINI <0.03 <0.03   BNP: Invalid input(s): POCBNP CBG: No results for input(s): GLUCAP in the last 168 hours. D-Dimer  Recent Labs  03/21/16 2358  DDIMER 0.41   Hgb A1c No results for input(s): HGBA1C in the last 72 hours. Lipid Profile No results for input(s): CHOL, HDL, LDLCALC, TRIG, CHOLHDL, LDLDIRECT in the last 72 hours. Thyroid function studies No results for input(s): TSH, T4TOTAL, T3FREE, THYROIDAB in the last 72 hours.  Invalid input(s): FREET3 Anemia work up  [employer]  03/22/16 0512  VITAMINB12 869   Urinalysis No results found for: COLORURINE, APPEARANCEUR, LABSPEC, [place], GLUCOSEU, HGBUR, BILIRUBINUR, KETONESUR, PROTEINUR, UROBILINOGEN, NITRITE, LEUKOCYTESUR Sepsis Labs Invalid input(s): PROCALCITONIN,  WBC,  LACTICIDVEN Microbiology No results found for this or any previous visit (from the past 240 hour(s)).   Time coordinating discharge: 40 minutes  SIGNED:  Dessa Phi, DO [facility] Pager [phone]  If 7PM-7AM, please contact night-coverage [email or web address] Password [facility] 03/24/2016, 10:44 AM

## 2016-03-24 NOTE — Progress Notes (Addendum)
The patient stated that she was walking to the drink machine to get a soda, however, a nurse on [place] called stating the patient was on their hall and suspected she was smoking. The patient returned to the room without incident. Patient did admit she was smoking in the bathroom. She stated that her "anxiety is so bad that I needed a cigarette, I've been smoking for years to help." She acknowledges that what she did is against the rules.   This RN discussed with the patient that if she needs to stay in the hospital that a discussion needs to occur to better control her pain and anxiety if smoking is a coping mechanism. I informed her that she has the right to leave and refuse treatment and that if the behavior continues that she will not be allowed to stay. The patient acknowledges these facts.

## 2016-03-26 DIAGNOSIS — M48062 Spinal stenosis, lumbar region with neurogenic claudication: Secondary | ICD-10-CM | POA: Diagnosis not present

## 2016-03-26 DIAGNOSIS — M5416 Radiculopathy, lumbar region: Secondary | ICD-10-CM | POA: Diagnosis not present

## 2016-04-01 DIAGNOSIS — J449 Chronic obstructive pulmonary disease, unspecified: Secondary | ICD-10-CM | POA: Diagnosis not present

## 2016-04-01 DIAGNOSIS — M545 Low back pain: Secondary | ICD-10-CM | POA: Diagnosis not present

## 2016-04-12 DIAGNOSIS — M48061 Spinal stenosis, lumbar region without neurogenic claudication: Secondary | ICD-10-CM | POA: Insufficient documentation

## 2016-04-13 DIAGNOSIS — J449 Chronic obstructive pulmonary disease, unspecified: Secondary | ICD-10-CM | POA: Diagnosis not present

## 2016-04-13 DIAGNOSIS — M545 Low back pain: Secondary | ICD-10-CM | POA: Diagnosis not present

## 2016-04-16 ENCOUNTER — Encounter: Payer: Self-pay | Admitting: Emergency Medicine

## 2016-04-16 ENCOUNTER — Emergency Department
Admission: EM | Admit: 2016-04-16 | Discharge: 2016-04-16 | Disposition: A | Discharge status: Home / Self Care | Payer: 59 | Attending: Emergency Medicine | Admitting: Emergency Medicine

## 2016-04-16 DIAGNOSIS — F17210 Nicotine dependence, cigarettes, uncomplicated: Secondary | ICD-10-CM | POA: Insufficient documentation

## 2016-04-16 DIAGNOSIS — J449 Chronic obstructive pulmonary disease, unspecified: Secondary | ICD-10-CM | POA: Insufficient documentation

## 2016-04-16 DIAGNOSIS — Z79899 Other long term (current) drug therapy: Secondary | ICD-10-CM | POA: Diagnosis not present

## 2016-04-16 DIAGNOSIS — F1193 Opioid use, unspecified with withdrawal: Secondary | ICD-10-CM

## 2016-04-16 DIAGNOSIS — F1123 Opioid dependence with withdrawal: Secondary | ICD-10-CM | POA: Diagnosis not present

## 2016-04-16 DIAGNOSIS — M549 Dorsalgia, unspecified: Secondary | ICD-10-CM | POA: Diagnosis present

## 2016-04-16 HISTORY — DX: Opioid abuse, uncomplicated: F11.10

## 2016-04-16 MED ORDER — ONDANSETRON 4 MG PO TBDP: 4.0000 mg | ORAL_TABLET | Freq: Three times a day (TID) | ORAL | 0 refills | Status: DC | PRN

## 2016-04-16 MED ORDER — DICYCLOMINE HCL 20 MG PO TABS: 20.0000 mg | ORAL_TABLET | Freq: Three times a day (TID) | ORAL | 0 refills | Status: DC | PRN

## 2016-04-16 MED ORDER — DIPHENHYDRAMINE HCL 25 MG PO CAPS: 25.0000 mg | ORAL_CAPSULE | Freq: Four times a day (QID) | ORAL | 0 refills | Status: DC | PRN

## 2016-04-16 NOTE — ED Triage Notes (Addendum)
Pt to ed with c/o wanting to detox from opiates, pt states she has been on pain meds for over 6 years and is trying to get into a rehab in another state.  States she does not have enough oxygen to get there and is waiting to get a tank big enough to get there.  Pt reports she is completely out of meds at this time. Pt denies SI, denies HI.

## 2016-04-16 NOTE — ED Provider Notes (Signed)
[facility] Emergency Department Provider Note   ____________________________________________    I have reviewed the triage vital signs and the nursing notes.   HISTORY  Chief Complaint Opioid withdrawal    HPI Misty Green is a 46 y.o. female with a history of COPD on home oxygen, who reports a long history of opiate abuse. Husband and sister are with her, they have a plan to go to a detox facility in [place] but are unable to get her there because they do not have enough oxygen tanks. They've been working with her primary care provider but something went wrong with the prescription and so they may not be able to go until after the weekend. They are here because they feel she may need to be admitted. Patient reports she is running out of medications   Past Medical History:  Diagnosis Date  . COPD (chronic obstructive pulmonary disease) ([place])   . Current smoker   . Opiate abuse, continuous     Patient Active Problem List   Diagnosis Date Noted  . Acute respiratory failure ([place]) 03/22/2016  . COPD exacerbation ([place]) 03/22/2016  . Chronic back pain 03/22/2016  . Opioid type dependence, abuse ([place]) 03/22/2016  . Polycythemia 03/22/2016  . Anxiety 03/22/2016  . OSA on CPAP 03/22/2016    Past Surgical History:  Procedure Laterality Date  . ABDOMINAL HYSTERECTOMY    . BACK SURGERY    . cesction    . HERNIA REPAIR    . ORIF WRIST FRACTURE Right 08/25/2015   Procedure: OPEN REDUCTION INTERNAL FIXATION (ORIF) WRIST FRACTURE;  Surgeon: Corky Mull, MD;  Location: [facility] ORS;  Service: Orthopedics;  Laterality: Right;    Prior to Admission medications   Medication Sig Start Date End Date Taking? Authorizing Provider  albuterol (PROVENTIL HFA;VENTOLIN HFA) 108 (90 Base) MCG/ACT inhaler Inhale into the lungs every 6 (six) hours as needed for wheezing or shortness of breath.    Historical Provider, MD  ALPRAZolam Duanne Moron) 1 MG tablet Take 1 mg by  mouth 4 (four) times daily.    Historical Provider, MD  Ascorbic Acid (VITAMIN C) 1000 MG tablet Take 1,000 mg by mouth daily.    Historical Provider, MD  dicyclomine (BENTYL) 20 MG tablet Take 1 tablet (20 mg total) by mouth 3 (three) times daily as needed for spasms. 04/16/16 04/21/16  Lavonia Drafts, MD  diphenhydrAMINE (BENADRYL) 25 mg capsule Take 1 capsule (25 mg total) by mouth every 6 (six) hours as needed for sleep. 04/16/16 04/20/16  Lavonia Drafts, MD  diphenhydramine-acetaminophen (TYLENOL PM) 25-500 MG TABS tablet Take 2 tablets by mouth at bedtime.    Historical Provider, MD  docusate sodium (COLACE) 100 MG capsule Take 300 mg by mouth at bedtime.    Historical Provider, MD  Fluticasone-Salmeterol (ADVAIR) 250-50 MCG/DOSE AEPB Inhale 1 puff into the lungs 2 (two) times daily.    Historical Provider, MD  furosemide (LASIX) 40 MG tablet Take 40 mg by mouth daily as needed for fluid.     Historical Provider, MD  gabapentin (NEURONTIN) 300 MG capsule Take 300 mg by mouth 3 (three) times daily.    Historical Provider, MD  Multiple Vitamins-Minerals (MULTIVITAMIN WITH MINERALS) tablet Take 1 tablet by mouth daily.    Historical Provider, MD  ondansetron (ZOFRAN ODT) 4 MG disintegrating tablet Take 1 tablet (4 mg total) by mouth every 8 (eight) hours as needed for nausea or vomiting. 04/16/16   Lavonia Drafts, MD  Oxycodone HCl  20 MG TABS Take 20 mg by mouth 4 (four) times daily as needed (pain).    Historical Provider, MD  predniSONE (DELTASONE) 10 MG tablet Take 4 tabs for 3 days, then 3 tabs for 3 days, then 2 tabs for 3 days, then 1 tab for 3 days, then 1/2 tab for 4 days. 03/24/16   Jennifer Chahn-Yang Choi, DO  promethazine (PHENERGAN) 25 MG tablet Take 1 tablet (25 mg total) by mouth every 6 (six) hours as needed for nausea or vomiting. 08/21/15   Pierce Crane Beers, PA-C  roflumilast (DALIRESP) 500 MCG TABS tablet Take 500 mcg by mouth daily.    Historical Provider, MD  tiotropium (SPIRIVA) 18 MCG  inhalation capsule Place 18 mcg into inhaler and inhale daily.    Historical Provider, MD  zolpidem (AMBIEN) 10 MG tablet Take 10 mg by mouth at bedtime as needed for sleep.    Historical Provider, MD     Allergies Other  History reviewed. No pertinent family history.  Social History Social History  Substance Use Topics  . Smoking status: Current Every Day Smoker    Packs/day: 1.00    Types: Cigarettes  . Smokeless tobacco: Never Used  . Alcohol use No    Review of Systems  Constitutional: No fever/chills  Cardiovascular: Denies chest pain. Respiratory: Chronic shortness of breath Gastrointestinal: No abdominal pain.     Musculoskeletal: Chronic back pain  Neurological: Negative for headaches  10-point ROS otherwise negative.  ____________________________________________   PHYSICAL EXAM:  VITAL SIGNS: ED Triage Vitals  Enc Vitals Group     BP 04/16/16 1645 117/73     Pulse Rate 04/16/16 1645 91     Resp 04/16/16 1645 18     Temp 04/16/16 1645 98.5 F (36.9 C)     Temp Source 04/16/16 1645 Oral     SpO2 04/16/16 1645 92 %     Weight 04/16/16 1646 141 lb (64 kg)     Height --      Head Circumference --      Peak Flow --      Pain Score 04/16/16 1644 10     Pain Loc --      Pain Edu? --      Excl. in [place]? --     Constitutional: Alert and oriented. No acute distress. Pleasant and interactive Eyes: Conjunctivae are normal.   Nose: No congestion/rhinnorhea. Mouth/Throat: Mucous membranes are moist.     Respiratory: Normal respiratory effort.  No retractions.   Genitourinary: deferred Musculoskeletal: Warm and well perfused Neurologic:  Normal speech and language. No gross focal neurologic deficits are appreciated.  Skin:  Skin is warm, dry and intact. No rash noted. Psychiatric: Mood and affect are normal. Speech and behavior are normal.  ____________________________________________   LABS (all labs ordered are listed, but only abnormal results are  displayed)  Labs Reviewed - No data to display ____________________________________________  EKG  None ____________________________________________  RADIOLOGY  None ____________________________________________   PROCEDURES  Procedure(s) performed: No    Critical Care performed: No ____________________________________________   INITIAL IMPRESSION / ASSESSMENT AND PLAN / ED COURSE  Pertinent labs & imaging results that were available during my care of the patient were reviewed by me and considered in my medical decision making (see chart for details).  Discussed with patient and family that we do not admit patients for opioid withdrawal. I have asked behavioral medicine nurse to come speak with patient and family for possible outpatient management.  Patient seen  by behavioral medicine nurse, several outpatient options provided however family has refused them based on insurance issues. They would prefer to go home, Zofran and Benadryl and Bentyl provided for symptomatic relief    ____________________________________________   FINAL CLINICAL IMPRESSION(S) / ED DIAGNOSES  Final diagnoses:  Opioid withdrawal (HCC)      NEW MEDICATIONS STARTED DURING THIS VISIT:  New Prescriptions   DICYCLOMINE (BENTYL) 20 MG TABLET    Take 1 tablet (20 mg total) by mouth 3 (three) times daily as needed for spasms.   DIPHENHYDRAMINE (BENADRYL) 25 MG CAPSULE    Take 1 capsule (25 mg total) by mouth every 6 (six) hours as needed for sleep.   ONDANSETRON (ZOFRAN ODT) 4 MG DISINTEGRATING TABLET    Take 1 tablet (4 mg total) by mouth every 8 (eight) hours as needed for nausea or vomiting.     Note:  This document was prepared using Dragon voice recognition software and may include unintentional dictation errors.    Lavonia Drafts, MD 04/16/16 2027

## 2016-04-16 NOTE — ED Notes (Addendum)
Pt reports that she is in stage 5 of COPD and that she is going to a facility in [place] to get off of pain pills - she states that her doctor told her to come to the ER and get set up for oxygen to take with her to [place] and she is also out of pain pills and they said that the ER would refill her pain medication - Dr Corky Downs at bedside

## 2016-04-23 DIAGNOSIS — J449 Chronic obstructive pulmonary disease, unspecified: Secondary | ICD-10-CM | POA: Diagnosis not present

## 2016-04-24 DIAGNOSIS — J441 Chronic obstructive pulmonary disease with (acute) exacerbation: Secondary | ICD-10-CM | POA: Diagnosis not present

## 2016-05-04 ENCOUNTER — Ambulatory Visit: Payer: 59 | Admitting: Pain Medicine

## 2016-05-23 DIAGNOSIS — J449 Chronic obstructive pulmonary disease, unspecified: Secondary | ICD-10-CM | POA: Diagnosis not present

## 2016-05-24 DIAGNOSIS — J441 Chronic obstructive pulmonary disease with (acute) exacerbation: Secondary | ICD-10-CM | POA: Diagnosis not present

## 2016-06-02 DIAGNOSIS — J209 Acute bronchitis, unspecified: Secondary | ICD-10-CM | POA: Diagnosis not present

## 2016-06-02 DIAGNOSIS — B9689 Other specified bacterial agents as the cause of diseases classified elsewhere: Secondary | ICD-10-CM | POA: Diagnosis not present

## 2016-06-02 DIAGNOSIS — J019 Acute sinusitis, unspecified: Secondary | ICD-10-CM | POA: Diagnosis not present

## 2016-06-15 DIAGNOSIS — G4733 Obstructive sleep apnea (adult) (pediatric): Secondary | ICD-10-CM | POA: Diagnosis not present

## 2016-06-23 DIAGNOSIS — J449 Chronic obstructive pulmonary disease, unspecified: Secondary | ICD-10-CM | POA: Diagnosis not present

## 2016-06-24 DIAGNOSIS — J441 Chronic obstructive pulmonary disease with (acute) exacerbation: Secondary | ICD-10-CM | POA: Diagnosis not present

## 2016-07-23 DIAGNOSIS — J449 Chronic obstructive pulmonary disease, unspecified: Secondary | ICD-10-CM | POA: Diagnosis not present

## 2016-07-24 DIAGNOSIS — J441 Chronic obstructive pulmonary disease with (acute) exacerbation: Secondary | ICD-10-CM | POA: Diagnosis not present

## 2016-08-23 DIAGNOSIS — J449 Chronic obstructive pulmonary disease, unspecified: Secondary | ICD-10-CM | POA: Diagnosis not present

## 2016-08-24 DIAGNOSIS — J441 Chronic obstructive pulmonary disease with (acute) exacerbation: Secondary | ICD-10-CM | POA: Diagnosis not present

## 2016-09-15 DIAGNOSIS — G4733 Obstructive sleep apnea (adult) (pediatric): Secondary | ICD-10-CM | POA: Diagnosis not present

## 2016-09-23 DIAGNOSIS — J449 Chronic obstructive pulmonary disease, unspecified: Secondary | ICD-10-CM | POA: Diagnosis not present

## 2016-09-24 DIAGNOSIS — J441 Chronic obstructive pulmonary disease with (acute) exacerbation: Secondary | ICD-10-CM | POA: Diagnosis not present

## 2016-10-23 DIAGNOSIS — J449 Chronic obstructive pulmonary disease, unspecified: Secondary | ICD-10-CM | POA: Diagnosis not present

## 2016-10-24 DIAGNOSIS — J441 Chronic obstructive pulmonary disease with (acute) exacerbation: Secondary | ICD-10-CM | POA: Diagnosis not present

## 2016-11-23 DIAGNOSIS — J449 Chronic obstructive pulmonary disease, unspecified: Secondary | ICD-10-CM | POA: Diagnosis not present

## 2016-11-24 DIAGNOSIS — J441 Chronic obstructive pulmonary disease with (acute) exacerbation: Secondary | ICD-10-CM | POA: Diagnosis not present

## 2016-12-16 DIAGNOSIS — G4733 Obstructive sleep apnea (adult) (pediatric): Secondary | ICD-10-CM | POA: Diagnosis not present

## 2016-12-23 DIAGNOSIS — J449 Chronic obstructive pulmonary disease, unspecified: Secondary | ICD-10-CM | POA: Diagnosis not present

## 2016-12-24 DIAGNOSIS — J441 Chronic obstructive pulmonary disease with (acute) exacerbation: Secondary | ICD-10-CM | POA: Diagnosis not present

## 2017-01-23 DIAGNOSIS — J449 Chronic obstructive pulmonary disease, unspecified: Secondary | ICD-10-CM | POA: Diagnosis not present

## 2017-01-24 DIAGNOSIS — J441 Chronic obstructive pulmonary disease with (acute) exacerbation: Secondary | ICD-10-CM | POA: Diagnosis not present

## 2017-02-23 DIAGNOSIS — J449 Chronic obstructive pulmonary disease, unspecified: Secondary | ICD-10-CM | POA: Diagnosis not present

## 2017-02-24 DIAGNOSIS — J441 Chronic obstructive pulmonary disease with (acute) exacerbation: Secondary | ICD-10-CM | POA: Diagnosis not present

## 2017-03-01 ENCOUNTER — Ambulatory Visit: Payer: Self-pay | Admitting: Obstetrics & Gynecology

## 2017-03-20 DIAGNOSIS — G4733 Obstructive sleep apnea (adult) (pediatric): Secondary | ICD-10-CM | POA: Diagnosis not present

## 2017-03-23 DIAGNOSIS — J449 Chronic obstructive pulmonary disease, unspecified: Secondary | ICD-10-CM | POA: Diagnosis not present

## 2017-03-24 DIAGNOSIS — J441 Chronic obstructive pulmonary disease with (acute) exacerbation: Secondary | ICD-10-CM | POA: Diagnosis not present

## 2017-03-28 DIAGNOSIS — J441 Chronic obstructive pulmonary disease with (acute) exacerbation: Secondary | ICD-10-CM | POA: Diagnosis not present

## 2017-04-23 DIAGNOSIS — J449 Chronic obstructive pulmonary disease, unspecified: Secondary | ICD-10-CM | POA: Diagnosis not present

## 2017-05-23 DIAGNOSIS — J449 Chronic obstructive pulmonary disease, unspecified: Secondary | ICD-10-CM | POA: Diagnosis not present

## 2017-06-21 DIAGNOSIS — G4733 Obstructive sleep apnea (adult) (pediatric): Secondary | ICD-10-CM | POA: Diagnosis not present

## 2017-06-23 DIAGNOSIS — J449 Chronic obstructive pulmonary disease, unspecified: Secondary | ICD-10-CM | POA: Diagnosis not present

## 2017-07-23 DIAGNOSIS — J449 Chronic obstructive pulmonary disease, unspecified: Secondary | ICD-10-CM | POA: Diagnosis not present

## 2017-08-23 DIAGNOSIS — J449 Chronic obstructive pulmonary disease, unspecified: Secondary | ICD-10-CM | POA: Diagnosis not present

## 2017-09-23 DIAGNOSIS — J449 Chronic obstructive pulmonary disease, unspecified: Secondary | ICD-10-CM | POA: Diagnosis not present

## 2017-10-23 DIAGNOSIS — J449 Chronic obstructive pulmonary disease, unspecified: Secondary | ICD-10-CM | POA: Diagnosis not present

## 2017-11-08 DIAGNOSIS — R197 Diarrhea, unspecified: Secondary | ICD-10-CM | POA: Diagnosis not present

## 2017-11-08 DIAGNOSIS — R1084 Generalized abdominal pain: Secondary | ICD-10-CM | POA: Diagnosis not present

## 2017-11-08 DIAGNOSIS — R110 Nausea: Secondary | ICD-10-CM | POA: Diagnosis not present

## 2017-11-09 ENCOUNTER — Other Ambulatory Visit: Payer: Self-pay | Admitting: Gastroenterology

## 2017-11-09 DIAGNOSIS — R197 Diarrhea, unspecified: Secondary | ICD-10-CM

## 2017-11-09 DIAGNOSIS — R110 Nausea: Secondary | ICD-10-CM

## 2017-11-09 DIAGNOSIS — R1084 Generalized abdominal pain: Secondary | ICD-10-CM

## 2017-11-16 ENCOUNTER — Ambulatory Visit
Admission: RE | Admit: 2017-11-16 | Discharge: 2017-11-16 | Disposition: A | Discharge status: Home / Self Care | Payer: 59 | Source: Ambulatory Visit | Attending: Gastroenterology | Admitting: Gastroenterology

## 2017-11-17 ENCOUNTER — Ambulatory Visit
Admission: RE | Admit: 2017-11-17 | Discharge: 2017-11-17 | Disposition: A | Discharge status: Home / Self Care | Payer: 59 | Source: Ambulatory Visit | Attending: Gastroenterology | Admitting: Gastroenterology

## 2017-11-23 DIAGNOSIS — J449 Chronic obstructive pulmonary disease, unspecified: Secondary | ICD-10-CM | POA: Diagnosis not present

## 2017-11-28 ENCOUNTER — Emergency Department: Payer: 59

## 2017-11-28 ENCOUNTER — Emergency Department
Admission: EM | Admit: 2017-11-28 | Discharge: 2017-11-28 | Disposition: A | Discharge status: Home / Self Care | Payer: 59 | Attending: Emergency Medicine | Admitting: Emergency Medicine

## 2017-11-28 ENCOUNTER — Encounter: Payer: Self-pay | Admitting: Emergency Medicine

## 2017-11-28 DIAGNOSIS — J449 Chronic obstructive pulmonary disease, unspecified: Secondary | ICD-10-CM | POA: Insufficient documentation

## 2017-11-28 DIAGNOSIS — F17210 Nicotine dependence, cigarettes, uncomplicated: Secondary | ICD-10-CM | POA: Diagnosis not present

## 2017-11-28 DIAGNOSIS — K5792 Diverticulitis of intestine, part unspecified, without perforation or abscess without bleeding: Secondary | ICD-10-CM

## 2017-11-28 DIAGNOSIS — R1030 Lower abdominal pain, unspecified: Secondary | ICD-10-CM

## 2017-11-28 DIAGNOSIS — R1111 Vomiting without nausea: Secondary | ICD-10-CM | POA: Diagnosis not present

## 2017-11-28 DIAGNOSIS — Z79899 Other long term (current) drug therapy: Secondary | ICD-10-CM | POA: Diagnosis not present

## 2017-11-28 DIAGNOSIS — R197 Diarrhea, unspecified: Secondary | ICD-10-CM | POA: Diagnosis not present

## 2017-11-28 DIAGNOSIS — K5732 Diverticulitis of large intestine without perforation or abscess without bleeding: Secondary | ICD-10-CM | POA: Diagnosis not present

## 2017-11-28 LAB — COMPREHENSIVE METABOLIC PANEL
ALBUMIN: 3.5 g/dL (ref 3.5–5.0)
ALT: 16 U/L (ref 0–44)
ANION GAP: 11 (ref 5–15)
AST: 16 U/L (ref 15–41)
Alkaline Phosphatase: 61 U/L (ref 38–126)
BUN: 9 mg/dL (ref 6–20)
CALCIUM: 8.6 mg/dL — AB (ref 8.9–10.3)
CHLORIDE: 94 mmol/L — AB (ref 98–111)
CO2: 33 mmol/L — AB (ref 22–32)
Creatinine, Ser: 0.53 mg/dL (ref 0.44–1.00)
GFR calc non Af Amer: >60 mL/min (ref >60)
GLUCOSE: 99 mg/dL (ref 70–99)
POTASSIUM: 3.9 mmol/L (ref 3.5–5.1)
SODIUM: 138 mmol/L (ref 135–145)
Total Bilirubin: 0.7 mg/dL (ref 0.3–1.2)
Total Protein: 7.9 g/dL (ref 6.5–8.1)

## 2017-11-28 LAB — CBC
HCT: 44.3 % (ref 36.0–46.0)
HEMOGLOBIN: 13.7 g/dL (ref 12.0–15.0)
MCH: 31.1 pg (ref 26.0–34.0)
MCHC: 30.9 g/dL (ref 30.0–36.0)
MCV: 100.7 fL — AB (ref 80.0–100.0)
NRBC: 0 % (ref 0.0–0.2)
PLATELETS: 299 10*3/uL (ref 150–400)
RBC: 4.4 MIL/uL (ref 3.87–5.11)
RDW: 12.5 % (ref 11.5–15.5)
WBC: 12.8 10*3/uL — ABNORMAL HIGH (ref 4.0–10.5)

## 2017-11-28 LAB — URINALYSIS, COMPLETE (UACMP) WITH MICROSCOPIC
Bacteria, UA: NONE SEEN
Bilirubin Urine: NEGATIVE
Glucose, UA: NEGATIVE mg/dL
Hgb urine dipstick: NEGATIVE
Ketones, ur: NEGATIVE mg/dL
LEUKOCYTES UA: NEGATIVE
Nitrite: NEGATIVE
Protein, ur: 100 mg/dL — AB
Specific Gravity, Urine: 1.014 (ref 1.005–1.030)
pH: 6 (ref 5.0–8.0)

## 2017-11-28 LAB — POCT PREGNANCY, URINE: PREG TEST UR: NEGATIVE

## 2017-11-28 LAB — LACTIC ACID, PLASMA: Lactic Acid, Venous: 0.7 mmol/L (ref 0.5–1.9)

## 2017-11-28 LAB — LIPASE, BLOOD: LIPASE: 25 U/L (ref 11–51)

## 2017-11-28 MED ORDER — SODIUM CHLORIDE 0.9 % IV BOLUS
1000.0000 mL | Freq: Once | INTRAVENOUS | Status: AC
  Administered 2017-11-28: 1000 mL via INTRAVENOUS

## 2017-11-28 MED ORDER — AMOXICILLIN-POT CLAVULANATE 875-125 MG PO TABS
1.0000 | ORAL_TABLET | Freq: Once | ORAL | Status: AC
  Administered 2017-11-28: 1 via ORAL
  Filled 2017-11-28: qty 1

## 2017-11-28 MED ORDER — METRONIDAZOLE IN NACL 5-0.79 MG/ML-% IV SOLN
500.0000 mg | Freq: Once | INTRAVENOUS | Status: AC
  Administered 2017-11-28: 500 mg via INTRAVENOUS
  Filled 2017-11-28: qty 100

## 2017-11-28 MED ORDER — AMOXICILLIN-POT CLAVULANATE 875-125 MG PO TABS: 1.0000 | ORAL_TABLET | Freq: Two times a day (BID) | ORAL | 0 refills | Status: DC

## 2017-11-28 MED ORDER — ONDANSETRON 4 MG PO TBDP
4.0000 mg | ORAL_TABLET | Freq: Once | ORAL | Status: AC | PRN
  Administered 2017-11-28: 4 mg via ORAL
  Filled 2017-11-28: qty 1

## 2017-11-28 MED ORDER — KETOROLAC TROMETHAMINE 30 MG/ML IJ SOLN
30.0000 mg | Freq: Once | INTRAMUSCULAR | Status: AC
  Administered 2017-11-28: 30 mg via INTRAVENOUS
  Filled 2017-11-28: qty 1

## 2017-11-28 MED ORDER — IOPAMIDOL (ISOVUE-300) INJECTION 61%
100.0000 mL | Freq: Once | INTRAVENOUS | Status: AC | PRN
  Administered 2017-11-28: 100 mL via INTRAVENOUS

## 2017-11-28 MED ORDER — CIPROFLOXACIN IN D5W 400 MG/200ML IV SOLN
400.0000 mg | Freq: Once | INTRAVENOUS | Status: AC
  Administered 2017-11-28: 400 mg via INTRAVENOUS
  Filled 2017-11-28: qty 200

## 2017-11-28 NOTE — ED Triage Notes (Addendum)
Patient presents to the ED with lower abdominal pain.  Patient states her stool x 2 days looked like clear mucous with blood in it.  Patient reports fever of 101 yesterday.  Patient reports hernia surgery 7 years ago and hysterectomy.  Reports severe lower/pelvic pain.  Patient is also complaining of bilateral flank pain.  Patient reports vomiting x 4 in 24 hours.

## 2017-11-28 NOTE — ED Notes (Addendum)
Pt 02 was 82%-84% on multiple fingers and 2 different o2 monitors. RN placed pt on 2L. O2 came up to 98%

## 2017-11-28 NOTE — ED Notes (Addendum)
Pt titrated to 1L

## 2017-11-28 NOTE — ED Provider Notes (Signed)
[facility] Emergency Department Provider Note  Time seen: 6:46 PM  I have reviewed the triage vital signs and the nursing notes.   HISTORY  Chief Complaint Abdominal Pain    HPI Misty Green is a 47 y.o. female with a past medical history of COPD, past history of opioid abuse, presents to the emergency department for lower abdominal pain.  According to the patient for the past 1 month she has been experiencing lower abdominal pain, somewhat worse over the past several days now with a low-grade fever.  Patient febrile to 100.6 in the emergency department.  States occasional nausea but denies any vomiting.  Does state for the past several days she has been having very mucousy stool occasionally with what appears to be blood streaked in the stool.  Denies any dysuria or hematuria.   Past Medical History:  Diagnosis Date  . COPD (chronic obstructive pulmonary disease) ([place])   . Current smoker   . Opiate abuse, continuous [facility])     Patient Active Problem List   Diagnosis Date Noted  . Acute respiratory failure ([place]) 03/22/2016  . COPD exacerbation ([place]) 03/22/2016  . Chronic back pain 03/22/2016  . Opioid type dependence, abuse ([place]) 03/22/2016  . Polycythemia 03/22/2016  . Anxiety 03/22/2016  . OSA on CPAP 03/22/2016    Past Surgical History:  Procedure Laterality Date  . ABDOMINAL HYSTERECTOMY    . BACK SURGERY    . cesction    . HERNIA REPAIR    . ORIF WRIST FRACTURE Right 08/25/2015   Procedure: OPEN REDUCTION INTERNAL FIXATION (ORIF) WRIST FRACTURE;  Surgeon: Corky Mull, MD;  Location: [facility] ORS;  Service: Orthopedics;  Laterality: Right;    Prior to Admission medications   Medication Sig Start Date End Date Taking? Authorizing Provider  albuterol (PROVENTIL HFA;VENTOLIN HFA) 108 (90 Base) MCG/ACT inhaler Inhale into the lungs every 6 (six) hours as needed for wheezing or shortness of breath.    [provider]  ALPRAZolam Duanne Moron) 1  MG tablet Take 1 mg by mouth 4 (four) times daily.    [provider]  Ascorbic Acid (VITAMIN C) 1000 MG tablet Take 1,000 mg by mouth daily.    [provider]  dicyclomine (BENTYL) 20 MG tablet Take 1 tablet (20 mg total) by mouth 3 (three) times daily as needed for spasms. 04/16/16 04/21/16  Lavonia Drafts, MD  diphenhydrAMINE (BENADRYL) 25 mg capsule Take 1 capsule (25 mg total) by mouth every 6 (six) hours as needed for sleep. 04/16/16 04/20/16  Lavonia Drafts, MD  diphenhydramine-acetaminophen (TYLENOL PM) 25-500 MG TABS tablet Take 2 tablets by mouth at bedtime.    [provider]  docusate sodium (COLACE) 100 MG capsule Take 300 mg by mouth at bedtime.    [provider]  Fluticasone-Salmeterol (ADVAIR) 250-50 MCG/DOSE AEPB Inhale 1 puff into the lungs 2 (two) times daily.    [provider]  furosemide (LASIX) 40 MG tablet Take 40 mg by mouth daily as needed for fluid.     [provider]  gabapentin (NEURONTIN) 300 MG capsule Take 300 mg by mouth 3 (three) times daily.    [provider]  Multiple Vitamins-Minerals (MULTIVITAMIN WITH MINERALS) tablet Take 1 tablet by mouth daily.    [provider]  ondansetron (ZOFRAN ODT) 4 MG disintegrating tablet Take 1 tablet (4 mg total) by mouth every 8 (eight) hours as needed for nausea or vomiting. 04/16/16   Lavonia Drafts, MD  Oxycodone  HCl 20 MG TABS Take 20 mg by mouth 4 (four) times daily as needed (pain).    [provider]  predniSONE (DELTASONE) 10 MG tablet Take 4 tabs for 3 days, then 3 tabs for 3 days, then 2 tabs for 3 days, then 1 tab for 3 days, then 1/2 tab for 4 days. 03/24/16   Dessa Phi, DO  promethazine (PHENERGAN) 25 MG tablet Take 1 tablet (25 mg total) by mouth every 6 (six) hours as needed for nausea or vomiting. 08/21/15   Beers, Pierce Crane, PA-C  roflumilast (DALIRESP) 500 MCG TABS tablet Take 500 mcg by mouth daily.    [provider]   tiotropium (SPIRIVA) 18 MCG inhalation capsule Place 18 mcg into inhaler and inhale daily.    [provider]  zolpidem (AMBIEN) 10 MG tablet Take 10 mg by mouth at bedtime as needed for sleep.    [provider]    Allergies  Allergen Reactions  . Other Nausea And Vomiting    Anti-depressent that starts with t    No family history on file.  Social History Social History   Tobacco Use  . Smoking status: Current Every Day Smoker    Packs/day: 1.00    Types: Cigarettes  . Smokeless tobacco: Never Used  Substance Use Topics  . Alcohol use: No  . Drug use: No    Review of Systems Constitutional: Subjective fever at home, 100.6 in the emergency department. Cardiovascular: Negative for chest pain. Respiratory: Negative for shortness of breath.  Negative for cough. Gastrointestinal: Moderate lower abdominal pain, dull/aching.  Positive for mucousy stool occasional blood streaked.  Negative for vomiting. Genitourinary: Negative for urinary compaints Musculoskeletal: Negative for musculoskeletal complaints Neurological: Negative for headache All other ROS negative  ____________________________________________   PHYSICAL EXAM:  VITAL SIGNS: ED Triage Vitals  Enc Vitals Group     BP 11/28/17 1435 138/85     Pulse Rate 11/28/17 1435 (!) 102     Resp 11/28/17 1435 (!) 22     Temp 11/28/17 1435 99.6 F (37.6 C)     Temp Source 11/28/17 1435 Oral     SpO2 11/28/17 1435 93 %     Weight 11/28/17 1438 191 lb (86.6 kg)     Height 11/28/17 1438 5\' 3"  (1.6 m)     Head Circumference --      Peak Flow --      Pain Score 11/28/17 1438 8     Pain Loc --      Pain Edu? --      Excl. in [place]? --    Constitutional: Alert and oriented. Well appearing and in no distress. Eyes: Normal exam ENT   Head: Normocephalic and atraumatic.   Mouth/Throat: Mucous membranes are moist. Cardiovascular: Normal rate, regular rhythm. No murmur Respiratory: Normal respiratory  effort without tachypnea nor retractions. Breath sounds are clear  Gastrointestinal: Soft, mild to moderate lower abdominal tenderness to palpation especially over the left and right lower quadrants.  No rebound guarding or distention. Musculoskeletal: Nontender with normal range of motion in all extremities.  Neurologic:  Normal speech and language. No gross focal neurologic deficits  Skin:  Skin is warm, dry and intact.  Psychiatric: Mood and affect are normal.   ____________________________________________    RADIOLOGY  CT shows wall thickening the sigmoid colon most consistent with diverticulitis. Chest x-ray shows chronic changes without acute abnormality.  ____________________________________________   INITIAL IMPRESSION / ASSESSMENT AND PLAN / ED COURSE  Pertinent  labs & imaging results that were available during my care of the patient were reviewed by me and considered in my medical decision making (see chart for details).  Patient presents to the emergency department for lower abdominal pain ongoing x1 month worse over the past several days now with low-grade fever 100.6 currently in the emergency department, mucousy stool with occasional blood streaking.  Differential would include diverticulitis, colitis, intra-abdominal abscess, UTI, pyelonephritis, appendicitis.  Patient's lab work shows mild leukocytosis currently 12,800 labs otherwise within normal limits.  Urinalysis is normal.  Chest x-ray shows chronic bronchitic changes no acute abnormality.  Will obtain CT imaging the abdomen/pelvis to further evaluate.  Highly suspect diverticulitis versus colitis possibly complicated by intra-abdominal abscess given she is now febrile and the duration of her symptoms.  CT scan consistent with diverticulitis of the sigmoid colon.  Given the patient's elevated white blood cell count and low-grade temperature 100.6 in the emergency department I offered admission to the hospital for IV  antibiotics.  Patient states she would prefer to go home if possible.  We will dose IV antibiotics, dose oral Augmentin and discharged with Augmentin for the patient.  I discussed return precautions for any worsening abdominal pain or continued fever.  Patient agreeable to plan of care.  We will follow-up with her primary care doctor.  ____________________________________________   FINAL CLINICAL IMPRESSION(S) / ED DIAGNOSES  Abdominal pain Diverticulitis    Harvest Dark, MD 11/28/17 2010

## 2017-12-03 ENCOUNTER — Encounter: Payer: Self-pay | Admitting: Emergency Medicine

## 2017-12-03 ENCOUNTER — Emergency Department: Payer: 59

## 2017-12-03 ENCOUNTER — Other Ambulatory Visit: Payer: Self-pay

## 2017-12-03 ENCOUNTER — Inpatient Hospital Stay
Admission: EM | Admit: 2017-12-03 | Discharge: 2017-12-13 | DRG: 392 | Disposition: A | Discharge status: Home / Self Care | Payer: 59 | Attending: Internal Medicine | Admitting: Internal Medicine

## 2017-12-03 DIAGNOSIS — K529 Noninfective gastroenteritis and colitis, unspecified: Secondary | ICD-10-CM | POA: Diagnosis present

## 2017-12-03 DIAGNOSIS — R197 Diarrhea, unspecified: Secondary | ICD-10-CM | POA: Diagnosis not present

## 2017-12-03 DIAGNOSIS — R600 Localized edema: Secondary | ICD-10-CM | POA: Diagnosis present

## 2017-12-03 DIAGNOSIS — K5792 Diverticulitis of intestine, part unspecified, without perforation or abscess without bleeding: Secondary | ICD-10-CM | POA: Diagnosis present

## 2017-12-03 DIAGNOSIS — R1030 Lower abdominal pain, unspecified: Secondary | ICD-10-CM

## 2017-12-03 DIAGNOSIS — K5730 Diverticulosis of large intestine without perforation or abscess without bleeding: Secondary | ICD-10-CM | POA: Diagnosis not present

## 2017-12-03 DIAGNOSIS — K5909 Other constipation: Secondary | ICD-10-CM | POA: Diagnosis present

## 2017-12-03 DIAGNOSIS — E876 Hypokalemia: Secondary | ICD-10-CM | POA: Diagnosis present

## 2017-12-03 DIAGNOSIS — E669 Obesity, unspecified: Secondary | ICD-10-CM | POA: Diagnosis present

## 2017-12-03 DIAGNOSIS — Z79899 Other long term (current) drug therapy: Secondary | ICD-10-CM | POA: Diagnosis not present

## 2017-12-03 DIAGNOSIS — N200 Calculus of kidney: Secondary | ICD-10-CM | POA: Diagnosis not present

## 2017-12-03 DIAGNOSIS — F17210 Nicotine dependence, cigarettes, uncomplicated: Secondary | ICD-10-CM | POA: Diagnosis present

## 2017-12-03 DIAGNOSIS — Z23 Encounter for immunization: Secondary | ICD-10-CM | POA: Diagnosis not present

## 2017-12-03 DIAGNOSIS — K5732 Diverticulitis of large intestine without perforation or abscess without bleeding: Principal | ICD-10-CM

## 2017-12-03 DIAGNOSIS — G629 Polyneuropathy, unspecified: Secondary | ICD-10-CM | POA: Diagnosis not present

## 2017-12-03 DIAGNOSIS — J449 Chronic obstructive pulmonary disease, unspecified: Secondary | ICD-10-CM | POA: Diagnosis present

## 2017-12-03 DIAGNOSIS — F419 Anxiety disorder, unspecified: Secondary | ICD-10-CM | POA: Diagnosis present

## 2017-12-03 DIAGNOSIS — Z6834 Body mass index (BMI) 34.0-34.9, adult: Secondary | ICD-10-CM

## 2017-12-03 DIAGNOSIS — J441 Chronic obstructive pulmonary disease with (acute) exacerbation: Secondary | ICD-10-CM | POA: Diagnosis not present

## 2017-12-03 DIAGNOSIS — R911 Solitary pulmonary nodule: Secondary | ICD-10-CM | POA: Diagnosis present

## 2017-12-03 DIAGNOSIS — F1110 Opioid abuse, uncomplicated: Secondary | ICD-10-CM | POA: Diagnosis present

## 2017-12-03 LAB — COMPREHENSIVE METABOLIC PANEL
ALT: 12 U/L (ref 0–44)
ANION GAP: 14 (ref 5–15)
AST: 15 U/L (ref 15–41)
Albumin: 3.2 g/dL — ABNORMAL LOW (ref 3.5–5.0)
Alkaline Phosphatase: 53 U/L (ref 38–126)
BUN: 6 mg/dL (ref 6–20)
CHLORIDE: 98 mmol/L (ref 98–111)
CO2: 33 mmol/L — AB (ref 22–32)
Calcium: 9.2 mg/dL (ref 8.9–10.3)
Creatinine, Ser: 0.39 mg/dL — ABNORMAL LOW (ref 0.44–1.00)
GFR calc non Af Amer: >60 mL/min (ref >60)
Glucose, Bld: 95 mg/dL (ref 70–99)
POTASSIUM: 3.4 mmol/L — AB (ref 3.5–5.1)
SODIUM: 145 mmol/L (ref 135–145)
Total Bilirubin: 0.6 mg/dL (ref 0.3–1.2)
Total Protein: 7.8 g/dL (ref 6.5–8.1)

## 2017-12-03 LAB — GASTROINTESTINAL PANEL BY PCR, STOOL (REPLACES STOOL CULTURE)
ASTROVIRUS: NOT DETECTED
Adenovirus F40/41: NOT DETECTED
CAMPYLOBACTER SPECIES: NOT DETECTED
Cryptosporidium: NOT DETECTED
Cyclospora cayetanensis: NOT DETECTED
ENTAMOEBA HISTOLYTICA: NOT DETECTED
ENTEROTOXIGENIC E COLI (ETEC): NOT DETECTED
Enteroaggregative E coli (EAEC): NOT DETECTED
Enteropathogenic E coli (EPEC): NOT DETECTED
Giardia lamblia: NOT DETECTED
NOROVIRUS GI/GII: NOT DETECTED
PLESIMONAS SHIGELLOIDES: NOT DETECTED
Rotavirus A: NOT DETECTED
SALMONELLA SPECIES: NOT DETECTED
SAPOVIRUS (I, II, IV, AND V): NOT DETECTED
SHIGA LIKE TOXIN PRODUCING E COLI (STEC): NOT DETECTED
SHIGELLA/ENTEROINVASIVE E COLI (EIEC): NOT DETECTED
VIBRIO CHOLERAE: NOT DETECTED
Vibrio species: NOT DETECTED
Yersinia enterocolitica: NOT DETECTED

## 2017-12-03 LAB — CBC
HCT: 41.6 % (ref 36.0–46.0)
HEMOGLOBIN: 12.6 g/dL (ref 12.0–15.0)
MCH: 30.9 pg (ref 26.0–34.0)
MCHC: 30.3 g/dL (ref 30.0–36.0)
MCV: 102 fL — ABNORMAL HIGH (ref 80.0–100.0)
PLATELETS: 399 10*3/uL (ref 150–400)
RBC: 4.08 MIL/uL (ref 3.87–5.11)
RDW: 12.3 % (ref 11.5–15.5)
WBC: 10.3 10*3/uL (ref 4.0–10.5)
nRBC: 0 % (ref 0.0–0.2)

## 2017-12-03 LAB — URINALYSIS, COMPLETE (UACMP) WITH MICROSCOPIC
BACTERIA UA: NONE SEEN
Bilirubin Urine: NEGATIVE
Glucose, UA: NEGATIVE mg/dL
Hgb urine dipstick: NEGATIVE
KETONES UR: NEGATIVE mg/dL
Leukocytes, UA: NEGATIVE
Nitrite: NEGATIVE
Protein, ur: 30 mg/dL — AB
SPECIFIC GRAVITY, URINE: 1.041 — AB (ref 1.005–1.030)
pH: 5 (ref 5.0–8.0)

## 2017-12-03 LAB — C DIFFICILE QUICK SCREEN W PCR REFLEX
C DIFFICILE (CDIFF) INTERP: NOT DETECTED
C DIFFICILE (CDIFF) TOXIN: NEGATIVE
C DIFFICLE (CDIFF) ANTIGEN: NEGATIVE

## 2017-12-03 LAB — LIPASE, BLOOD: LIPASE: 20 U/L (ref 11–51)

## 2017-12-03 MED ORDER — ADULT MULTIVITAMIN W/MINERALS CH
1.0000 | ORAL_TABLET | Freq: Every day | ORAL | Status: DC
  Administered 2017-12-04 – 2017-12-13 (×9): 1 via ORAL
  Filled 2017-12-03 (×9): qty 1

## 2017-12-03 MED ORDER — ENOXAPARIN SODIUM 40 MG/0.4ML ~~LOC~~ SOLN
40.0000 mg | SUBCUTANEOUS | Status: DC
  Administered 2017-12-03 – 2017-12-12 (×10): 40 mg via SUBCUTANEOUS
  Filled 2017-12-03 (×10): qty 0.4

## 2017-12-03 MED ORDER — TIOTROPIUM BROMIDE MONOHYDRATE 18 MCG IN CAPS
18.0000 ug | ORAL_CAPSULE | Freq: Every day | RESPIRATORY_TRACT | Status: DC
  Administered 2017-12-04 – 2017-12-13 (×10): 18 ug via RESPIRATORY_TRACT
  Filled 2017-12-03 (×2): qty 5

## 2017-12-03 MED ORDER — ONDANSETRON HCL 4 MG/2ML IJ SOLN
4.0000 mg | Freq: Once | INTRAMUSCULAR | Status: DC
  Filled 2017-12-03: qty 2

## 2017-12-03 MED ORDER — ALPRAZOLAM 0.5 MG PO TABS
1.0000 mg | ORAL_TABLET | Freq: Four times a day (QID) | ORAL | Status: DC
  Administered 2017-12-03 – 2017-12-13 (×37): 1 mg via ORAL
  Filled 2017-12-03 (×37): qty 2

## 2017-12-03 MED ORDER — ROFLUMILAST 500 MCG PO TABS
500.0000 ug | ORAL_TABLET | Freq: Every day | ORAL | Status: DC
  Administered 2017-12-04 – 2017-12-13 (×9): 500 ug via ORAL
  Filled 2017-12-03 (×10): qty 1

## 2017-12-03 MED ORDER — GABAPENTIN 300 MG PO CAPS
300.0000 mg | ORAL_CAPSULE | Freq: Three times a day (TID) | ORAL | Status: DC
  Administered 2017-12-03 – 2017-12-13 (×28): 300 mg via ORAL
  Filled 2017-12-03 (×29): qty 1

## 2017-12-03 MED ORDER — IOHEXOL 300 MG/ML  SOLN
100.0000 mL | Freq: Once | INTRAMUSCULAR | Status: AC | PRN
  Administered 2017-12-03: 100 mL via INTRAVENOUS

## 2017-12-03 MED ORDER — ONDANSETRON HCL 4 MG/2ML IJ SOLN
4.0000 mg | Freq: Four times a day (QID) | INTRAMUSCULAR | Status: DC | PRN
  Administered 2017-12-03 – 2017-12-12 (×7): 4 mg via INTRAVENOUS
  Filled 2017-12-03 (×8): qty 2

## 2017-12-03 MED ORDER — ZOLPIDEM TARTRATE 5 MG PO TABS
5.0000 mg | ORAL_TABLET | Freq: Every evening | ORAL | Status: DC | PRN
  Administered 2017-12-03 – 2017-12-12 (×10): 5 mg via ORAL
  Filled 2017-12-03 (×10): qty 1

## 2017-12-03 MED ORDER — FENTANYL CITRATE (PF) 100 MCG/2ML IJ SOLN
100.0000 ug | Freq: Once | INTRAMUSCULAR | Status: AC
  Administered 2017-12-03: 100 ug via INTRAVENOUS
  Filled 2017-12-03: qty 2

## 2017-12-03 MED ORDER — SODIUM CHLORIDE 0.9 % IV SOLN
1.0000 g | Freq: Three times a day (TID) | INTRAVENOUS | Status: DC
  Administered 2017-12-03 – 2017-12-05 (×5): 1 g via INTRAVENOUS
  Filled 2017-12-03 (×7): qty 1

## 2017-12-03 MED ORDER — ONDANSETRON HCL 4 MG/2ML IJ SOLN
INTRAMUSCULAR | Status: AC
  Filled 2017-12-03: qty 2

## 2017-12-03 MED ORDER — ACETAMINOPHEN 325 MG PO TABS
650.0000 mg | ORAL_TABLET | Freq: Four times a day (QID) | ORAL | Status: DC | PRN
  Administered 2017-12-04 – 2017-12-12 (×3): 650 mg via ORAL
  Filled 2017-12-03 (×4): qty 2

## 2017-12-03 MED ORDER — INFLUENZA VAC SPLIT QUAD 0.5 ML IM SUSY
0.5000 mL | PREFILLED_SYRINGE | INTRAMUSCULAR | Status: AC
  Administered 2017-12-04: 0.5 mL via INTRAMUSCULAR
  Filled 2017-12-03: qty 0.5

## 2017-12-03 MED ORDER — METHYLPREDNISOLONE SODIUM SUCC 125 MG IJ SOLR
125.0000 mg | Freq: Once | INTRAMUSCULAR | Status: AC
  Administered 2017-12-03: 125 mg via INTRAVENOUS
  Filled 2017-12-03: qty 2

## 2017-12-03 MED ORDER — SODIUM CHLORIDE 0.9 % IV BOLUS
1000.0000 mL | Freq: Once | INTRAVENOUS | Status: AC
  Administered 2017-12-03: 1000 mL via INTRAVENOUS

## 2017-12-03 MED ORDER — ONDANSETRON HCL 4 MG PO TABS
4.0000 mg | ORAL_TABLET | Freq: Four times a day (QID) | ORAL | Status: DC | PRN
  Administered 2017-12-13: 4 mg via ORAL
  Filled 2017-12-03: qty 1

## 2017-12-03 MED ORDER — HYDROCODONE-ACETAMINOPHEN 7.5-325 MG PO TABS
1.0000 | ORAL_TABLET | Freq: Four times a day (QID) | ORAL | Status: DC | PRN
  Administered 2017-12-04 (×3): 1 via ORAL
  Filled 2017-12-03 (×3): qty 1

## 2017-12-03 MED ORDER — ACETAMINOPHEN 650 MG RE SUPP
650.0000 mg | Freq: Four times a day (QID) | RECTAL | Status: DC | PRN
  Filled 2017-12-03: qty 1

## 2017-12-03 MED ORDER — MORPHINE SULFATE (PF) 2 MG/ML IV SOLN
2.0000 mg | INTRAVENOUS | Status: DC | PRN
  Administered 2017-12-03 – 2017-12-04 (×5): 2 mg via INTRAVENOUS
  Filled 2017-12-03 (×5): qty 1

## 2017-12-03 MED ORDER — IPRATROPIUM-ALBUTEROL 0.5-2.5 (3) MG/3ML IN SOLN
6.0000 mL | Freq: Once | RESPIRATORY_TRACT | Status: AC
  Administered 2017-12-03: 6 mL via RESPIRATORY_TRACT
  Filled 2017-12-03: qty 6

## 2017-12-03 MED ORDER — MOMETASONE FURO-FORMOTEROL FUM 200-5 MCG/ACT IN AERO
2.0000 | INHALATION_SPRAY | Freq: Two times a day (BID) | RESPIRATORY_TRACT | Status: DC
  Administered 2017-12-03 – 2017-12-13 (×20): 2 via RESPIRATORY_TRACT
  Filled 2017-12-03: qty 8.8

## 2017-12-03 MED ORDER — PIPERACILLIN-TAZOBACTAM 3.375 G IVPB 30 MIN
3.3750 g | Freq: Once | INTRAVENOUS | Status: AC
  Administered 2017-12-03: 3.375 g via INTRAVENOUS
  Filled 2017-12-03: qty 50

## 2017-12-03 MED ORDER — ONDANSETRON 4 MG PO TBDP
4.0000 mg | ORAL_TABLET | Freq: Once | ORAL | Status: AC | PRN
  Administered 2017-12-03: 4 mg via ORAL
  Filled 2017-12-03: qty 1

## 2017-12-03 NOTE — ED Provider Notes (Signed)
[facility] Emergency Department Provider Note  ____________________________________________   First MD Initiated Contact with Patient 12/03/17 1358     (approximate)  I have reviewed the triage vital signs and the nursing notes.   HISTORY  Chief Complaint Abdominal Pain   HPI Misty Green is a 47 y.o. female with a history of COPD as well as opiate abuse, off opiates now for 19 months, who is presenting with severe lower abdominal pain.  Says the pain is an 8 out of 10 and cramping.  Says it is now radiating to her back.  Diagnosed with diverticulitis this past Monday at this emergency department and sent home with p.o. antibiotics.  Despite this, the patient says that her symptoms have been worsening.  Says that she has not noted a fever today but also took Tylenol prior to arrival.  Says that she has also had difficulty moving her bowels and feels a mass in her rectum when she moves her bowels.  Also says that her stool appears like clear jelly mixed with blood.  No recent travel or drinking from streams.  Said that she had a Z-Pak several weeks ago prior to the onset of the symptoms.  No known sick contacts.   Past Medical History:  Diagnosis Date  . COPD (chronic obstructive pulmonary disease) ([place])   . Current smoker   . Opiate abuse, continuous [facility])     Patient Active Problem List   Diagnosis Date Noted  . Acute respiratory failure ([place]) 03/22/2016  . COPD exacerbation ([place]) 03/22/2016  . Chronic back pain 03/22/2016  . Opioid type dependence, abuse ([place]) 03/22/2016  . Polycythemia 03/22/2016  . Anxiety 03/22/2016  . OSA on CPAP 03/22/2016    Past Surgical History:  Procedure Laterality Date  . ABDOMINAL HYSTERECTOMY    . BACK SURGERY    . cesction    . HERNIA REPAIR    . ORIF WRIST FRACTURE Right 08/25/2015   Procedure: OPEN REDUCTION INTERNAL FIXATION (ORIF) WRIST FRACTURE;  Surgeon: Corky Mull, MD;  Location: [facility] ORS;  Service:  Orthopedics;  Laterality: Right;    Prior to Admission medications   Medication Sig Start Date End Date Taking? Authorizing Provider  albuterol (PROVENTIL HFA;VENTOLIN HFA) 108 (90 Base) MCG/ACT inhaler Inhale into the lungs every 6 (six) hours as needed for wheezing or shortness of breath.    [provider]  ALPRAZolam Duanne Moron) 1 MG tablet Take 1 mg by mouth 4 (four) times daily.    [provider]  amoxicillin-clavulanate (AUGMENTIN) 875-125 MG tablet Take 1 tablet by mouth 2 (two) times daily for 14 days. 11/28/17 12/12/17  Harvest Dark, MD  Ascorbic Acid (VITAMIN C) 1000 MG tablet Take 1,000 mg by mouth daily.    [provider]  dicyclomine (BENTYL) 20 MG tablet Take 1 tablet (20 mg total) by mouth 3 (three) times daily as needed for spasms. 04/16/16 04/21/16  Lavonia Drafts, MD  diphenhydrAMINE (BENADRYL) 25 mg capsule Take 1 capsule (25 mg total) by mouth every 6 (six) hours as needed for sleep. 04/16/16 04/20/16  Lavonia Drafts, MD  diphenhydramine-acetaminophen (TYLENOL PM) 25-500 MG TABS tablet Take 2 tablets by mouth at bedtime.    [provider]  docusate sodium (COLACE) 100 MG capsule Take 300 mg by mouth at bedtime.    [provider]  Fluticasone-Salmeterol (ADVAIR) 250-50 MCG/DOSE AEPB Inhale 1 puff into the lungs 2 (two) times daily.    [provider]  furosemide (LASIX)  40 MG tablet Take 40 mg by mouth daily as needed for fluid.     [provider]  gabapentin (NEURONTIN) 300 MG capsule Take 300 mg by mouth 3 (three) times daily.    [provider]  Multiple Vitamins-Minerals (MULTIVITAMIN WITH MINERALS) tablet Take 1 tablet by mouth daily.    [provider]  ondansetron (ZOFRAN ODT) 4 MG disintegrating tablet Take 1 tablet (4 mg total) by mouth every 8 (eight) hours as needed for nausea or vomiting. 04/16/16   Lavonia Drafts, MD  Oxycodone HCl 20 MG TABS Take 20 mg by mouth 4 (four) times daily as  needed (pain).    [provider]  predniSONE (DELTASONE) 10 MG tablet Take 4 tabs for 3 days, then 3 tabs for 3 days, then 2 tabs for 3 days, then 1 tab for 3 days, then 1/2 tab for 4 days. 03/24/16   Dessa Phi, DO  promethazine (PHENERGAN) 25 MG tablet Take 1 tablet (25 mg total) by mouth every 6 (six) hours as needed for nausea or vomiting. 08/21/15   Beers, Pierce Crane, PA-C  roflumilast (DALIRESP) 500 MCG TABS tablet Take 500 mcg by mouth daily.    [provider]  tiotropium (SPIRIVA) 18 MCG inhalation capsule Place 18 mcg into inhaler and inhale daily.    [provider]  zolpidem (AMBIEN) 10 MG tablet Take 10 mg by mouth at bedtime as needed for sleep.    [provider]    Allergies Other  No family history on file.  Social History Social History   Tobacco Use  . Smoking status: Current Every Day Smoker    Packs/day: 1.00    Types: Cigarettes  . Smokeless tobacco: Never Used  Substance Use Topics  . Alcohol use: No  . Drug use: No    Review of Systems  Constitutional: No fever/chills Eyes: No visual changes. ENT: No sore throat. Cardiovascular: Denies chest pain. Respiratory: Denies shortness of breath. Gastrointestinal:   No nausea, no vomiting.  Genitourinary: Negative for dysuria. Musculoskeletal: Negative for back pain. Skin: Negative for rash. Neurological: Negative for headaches, focal weakness or numbness.  ____________________________________________   PHYSICAL EXAM:  VITAL SIGNS: ED Triage Vitals  Enc Vitals Group     BP 12/03/17 1347 (!) 154/77     Pulse Rate 12/03/17 1347 90     Resp 12/03/17 1347 (!) 25     Temp 12/03/17 1347 97.8 F (36.6 C)     Temp Source 12/03/17 1347 Oral     SpO2 12/03/17 1347 (!) 87 %     Weight 12/03/17 1348 191 lb (86.6 kg)     Height 12/03/17 1348 5\' 3"  (1.6 m)     Head Circumference --      Peak Flow --      Pain Score 12/03/17 1348 8     Pain Loc --      Pain Edu? --       Excl. in [place]? --     Constitutional: Alert and oriented. Well appearing and in no acute distress. Eyes: Conjunctivae are normal.  Head: Atraumatic. Nose: No congestion/rhinnorhea. Mouth/Throat: Mucous membranes are moist.  Neck: No stridor.   Cardiovascular: Normal rate, regular rhythm. Grossly normal heart sounds.  Good peripheral circulation. Respiratory: Normal respiratory effort.  No retractions. Lungs CTAB. Gastrointestinal: Soft with diffuse abdominal tenderness to palpation but with severe tenderness palpation of the left lower quadrant with guarding.  No distention.  Grossly on the rectal exam the  patient has several small non-engorged hemorrhoids but there are no protruding internal hemorrhoids nor does there appear to be any prolapse. Musculoskeletal: No lower extremity tenderness nor edema.  No joint effusions. Neurologic:  Normal speech and language. No gross focal neurologic deficits are appreciated. Skin:  Skin is warm, dry and intact. No rash noted. Psychiatric: Mood and affect are normal. Speech and behavior are normal.  ____________________________________________   LABS (all labs ordered are listed, but only abnormal results are displayed)  Labs Reviewed  COMPREHENSIVE METABOLIC PANEL - Abnormal; Notable for the following components:      Result Value   Potassium 3.4 (*)    CO2 33 (*)    Creatinine, Ser 0.39 (*)    Albumin 3.2 (*)    All other components within normal limits  CBC - Abnormal; Notable for the following components:   MCV 102.0 (*)    All other components within normal limits  GASTROINTESTINAL PANEL BY PCR, STOOL (REPLACES STOOL CULTURE)  C DIFFICILE QUICK SCREEN W PCR REFLEX  LIPASE, BLOOD  URINALYSIS, COMPLETE (UACMP) WITH MICROSCOPIC   ____________________________________________  EKG   ____________________________________________  RADIOLOGY  Pending CAT scan as well as chest x-ray at this  time. ____________________________________________   PROCEDURES  Procedure(s) performed:   Procedures  Critical Care performed:   ____________________________________________   INITIAL IMPRESSION / ASSESSMENT AND PLAN / ED COURSE  Pertinent labs & imaging results that were available during my care of the patient were reviewed by me and considered in my medical decision making (see chart for details).  Differential diagnosis includes, but is not limited to, ovarian cyst, ovarian torsion, acute appendicitis, diverticulitis, urinary tract infection/pyelonephritis, endometriosis, bowel obstruction, colitis, renal colic, gastroenteritis, hernia, fibroids, endometriosis, pregnancy related pain including ectopic pregnancy, etc. As part of my medical decision making, I reviewed the following data within the electronic MEDICAL RECORD NUMBER Notes from prior ED visits  ----------------------------------------- 3:17 PM on 12/03/2017 -----------------------------------------  Patient pending CAT scan at this time.  Receiving breathing treatments as well as steroids.  Possible admit for pain control as long as there is no developing pathology on the CAT scan.  Patient to require reassessment.  Signed with Dr. Archie Balboa. ____________________________________________   FINAL CLINICAL IMPRESSION(S) / ED DIAGNOSES  Lower abdominal pain.  COPD exacerbation.  NEW MEDICATIONS STARTED DURING THIS VISIT:  New Prescriptions   No medications on file     Note:  This document was prepared using Dragon voice recognition software and may include unintentional dictation errors.     Orbie Pyo, MD 12/03/17 [phone]

## 2017-12-03 NOTE — Progress Notes (Signed)
Pharmacy Antibiotic Note  Misty Green is a 47 y.o. female admitted on 12/03/2017 with diverticulitis.  Pharmacy has been consulted for meropenem dosing. Patient received one dose of Zosyn in the ED.  Plan: Start meropenem 1 g IV q8h tonight at 2200  Height: 5\' 3"  (160 cm) Weight: 191 lb (86.6 kg) IBW/kg (Calculated) : 52.4  Temp (24hrs), Avg:97.8 F (36.6 C), Min:97.8 F (36.6 C), Max:97.8 F (36.6 C)  Recent Labs  Lab 11/28/17 1444 12/03/17 1415  WBC 12.8* 10.3  CREATININE 0.53 0.39*  LATICACIDVEN 0.7  --     Estimated Creatinine Clearance: 89.7 mL/min (A) (by C-G formula based on SCr of 0.39 mg/dL (L)).    Allergies  Allergen Reactions  . Other Nausea And Vomiting    Anti-depressent that starts with t    Antimicrobials this admission: Zosyn 11/16 x 1 dose Meropenem 11/16 >>  Dose adjustments this admission: NA  Microbiology results: GI panel, C diff pending  Thank you for allowing pharmacy to be a part of this patient's care.  Tawnya Crook, PharmD Pharmacy Resident  12/03/2017 5:29 PM

## 2017-12-03 NOTE — H&P (Signed)
[facility] NAME: Misty Green    MR#:  [id number]  DATE OF BIRTH:  March 08, 1970  DATE OF ADMISSION:  12/03/2017  PRIMARY CARE PHYSICIAN: Sinda Du, MD   REQUESTING/REFERRING PHYSICIAN: Dr. Nance Pear  CHIEF COMPLAINT:   Chief Complaint  Patient presents with  . Abdominal Pain    HISTORY OF PRESENT ILLNESS:  Misty Green  is a 47 y.o. female with a known history of COPD with ongoing tobacco abuse, history of opiate abuse, anxiety, neuropathy who presents to the hospital due to abdominal pain and intermittent nausea vomiting.  Patient presented to the hospital with similar symptoms about 5 days ago and was diagnosed with acute sigmoid diverticulitis and discharged on oral Augmentin.  She took her antibiotics but her pain has not improved and she therefore came back to the ER for further evaluation.  Patient CT abdomen pelvis today shows progression of her diverticulitis but no abscess formation or perforation.  Hospitalist services were contacted for admission.  Patient says she has been having abdominal pain and nausea vomiting ongoing for the past 3 weeks and has progressively gotten worse.  She admits to some loose stools but it has been nonbloody in nature, she has some low-grade fever 5 days ago around 100.6 but she is presently afebrile and hemodynamically stable.  PAST MEDICAL HISTORY:   Past Medical History:  Diagnosis Date  . COPD (chronic obstructive pulmonary disease) ([place])   . Current smoker   . Opiate abuse, continuous ([place])     PAST SURGICAL HISTORY:   Past Surgical History:  Procedure Laterality Date  . ABDOMINAL HYSTERECTOMY    . BACK SURGERY    . cesction    . HERNIA REPAIR    . ORIF WRIST FRACTURE Right 08/25/2015   Procedure: OPEN REDUCTION INTERNAL FIXATION (ORIF) WRIST FRACTURE;  Surgeon: Corky Mull, MD;  Location: [facility] ORS;  Service: Orthopedics;  Laterality: Right;    SOCIAL HISTORY:     Social History   Tobacco Use  . Smoking status: Current Every Day Smoker    Packs/day: 1.00    Years: 15.00    Pack years: 15.00    Types: Cigarettes  . Smokeless tobacco: Never Used  Substance Use Topics  . Alcohol use: No    FAMILY HISTORY:   Family History  Problem Relation Age of Onset  . COPD Mother   . Hypertension Father   . Diabetes Father     DRUG ALLERGIES:   Allergies  Allergen Reactions  . Other Nausea And Vomiting    Anti-depressent that starts with t    REVIEW OF SYSTEMS:   Review of Systems  Constitutional: Negative for fever and weight loss.  HENT: Negative for congestion, nosebleeds and tinnitus.   Eyes: Negative for blurred vision, double vision and redness.  Respiratory: Negative for cough, hemoptysis and shortness of breath.   Cardiovascular: Negative for chest pain, orthopnea, leg swelling and PND.  Gastrointestinal: Positive for abdominal pain, nausea and vomiting. Negative for diarrhea and melena.  Genitourinary: Negative for dysuria, hematuria and urgency.  Musculoskeletal: Negative for falls and joint pain.  Neurological: Negative for dizziness, tingling, sensory change, focal weakness, seizures, weakness and headaches.  Endo/Heme/Allergies: Negative for polydipsia. Does not bruise/bleed easily.  Psychiatric/Behavioral: Negative for depression and memory loss. The patient is not nervous/anxious.     MEDICATIONS AT HOME:   Prior to Admission medications   Medication Sig Start Date End Date Taking? Authorizing  Provider  albuterol (PROVENTIL HFA;VENTOLIN HFA) 108 (90 Base) MCG/ACT inhaler Inhale into the lungs every 6 (six) hours as needed for wheezing or shortness of breath.    [provider]  ALPRAZolam Duanne Moron) 1 MG tablet Take 1 mg by mouth 4 (four) times daily.    [provider]  amoxicillin-clavulanate (AUGMENTIN) 875-125 MG tablet Take 1 tablet by mouth 2 (two) times daily for 14 days. 11/28/17 12/12/17   Harvest Dark, MD  Ascorbic Acid (VITAMIN C) 1000 MG tablet Take 1,000 mg by mouth daily.    [provider]  dicyclomine (BENTYL) 20 MG tablet Take 1 tablet (20 mg total) by mouth 3 (three) times daily as needed for spasms. 04/16/16 04/21/16  Lavonia Drafts, MD  diphenhydrAMINE (BENADRYL) 25 mg capsule Take 1 capsule (25 mg total) by mouth every 6 (six) hours as needed for sleep. 04/16/16 04/20/16  Lavonia Drafts, MD  diphenhydramine-acetaminophen (TYLENOL PM) 25-500 MG TABS tablet Take 2 tablets by mouth at bedtime.    [provider]  docusate sodium (COLACE) 100 MG capsule Take 300 mg by mouth at bedtime.    [provider]  Fluticasone-Salmeterol (ADVAIR) 250-50 MCG/DOSE AEPB Inhale 1 puff into the lungs 2 (two) times daily.    [provider]  furosemide (LASIX) 40 MG tablet Take 40 mg by mouth daily as needed for fluid.     [provider]  gabapentin (NEURONTIN) 300 MG capsule Take 300 mg by mouth 3 (three) times daily.    [provider]  Multiple Vitamins-Minerals (MULTIVITAMIN WITH MINERALS) tablet Take 1 tablet by mouth daily.    [provider]  ondansetron (ZOFRAN ODT) 4 MG disintegrating tablet Take 1 tablet (4 mg total) by mouth every 8 (eight) hours as needed for nausea or vomiting. 04/16/16   Lavonia Drafts, MD  Oxycodone HCl 20 MG TABS Take 20 mg by mouth 4 (four) times daily as needed (pain).    [provider]  predniSONE (DELTASONE) 10 MG tablet Take 4 tabs for 3 days, then 3 tabs for 3 days, then 2 tabs for 3 days, then 1 tab for 3 days, then 1/2 tab for 4 days. 03/24/16   Dessa Phi, DO  promethazine (PHENERGAN) 25 MG tablet Take 1 tablet (25 mg total) by mouth every 6 (six) hours as needed for nausea or vomiting. 08/21/15   Beers, Pierce Crane, PA-C  roflumilast (DALIRESP) 500 MCG TABS tablet Take 500 mcg by mouth daily.    [provider]  tiotropium (SPIRIVA) 18 MCG inhalation capsule Place 18 mcg  into inhaler and inhale daily.    [provider]  zolpidem (AMBIEN) 10 MG tablet Take 10 mg by mouth at bedtime as needed for sleep.    [provider]      VITAL SIGNS:  Blood pressure 125/78, pulse 82, temperature 97.8 F (36.6 C), temperature source Oral, resp. rate 20, height 5\' 3"  (1.6 m), weight 86.6 kg, SpO2 96 %.  PHYSICAL EXAMINATION:  Physical Exam  GENERAL:  47 y.o.-year-old patient lying in the bed in NAD.   EYES: Pupils equal, round, reactive to light and accommodation. No scleral icterus. Extraocular muscles intact.  HEENT: Head atraumatic, normocephalic. Oropharynx and nasopharynx clear. No oropharyngeal erythema, moist oral mucosa  NECK:  Supple, no jugular venous distention. No thyroid enlargement, no tenderness.  LUNGS: Normal breath sounds bilaterally, no wheezing, rales, rhonchi. No use of accessory muscles of respiration.  CARDIOVASCULAR: S1, S2 RRR. No murmurs, rubs, gallops, clicks.  ABDOMEN: Soft, Tender in LLQ, no rebound, rigidity, nondistended. Bowel sounds present. No organomegaly or mass.  EXTREMITIES: No pedal edema, cyanosis, or clubbing. + 2 pedal & radial pulses b/l.   NEUROLOGIC: Cranial nerves II through XII are intact. No focal Motor or sensory deficits appreciated b/l.  PSYCHIATRIC: The patient is alert and oriented x 3.  SKIN: No obvious rash, lesion, or ulcer.   LABORATORY PANEL:   CBC Recent Labs  Lab 12/03/17 1415  WBC 10.3  HGB 12.6  HCT 41.6  PLT 399   ------------------------------------------------------------------------------------------------------------------  Chemistries  Recent Labs  Lab 12/03/17 1415  NA 145  K 3.4*  CL 98  CO2 33*  GLUCOSE 95  BUN 6  CREATININE 0.39*  CALCIUM 9.2  AST 15  ALT 12  ALKPHOS 53  BILITOT 0.6   ------------------------------------------------------------------------------------------------------------------  Cardiac Enzymes No results for input(s): TROPONINI in  the last 168 hours. ------------------------------------------------------------------------------------------------------------------  RADIOLOGY:  Dg Chest 2 View  Result Date: 12/03/2017 CLINICAL DATA:  Hypoxia EXAM: CHEST - 2 VIEW COMPARISON:  11/28/2017 FINDINGS: Cardiac shadow is stable. Multiple calcified granulomas are again identified. No focal infiltrate is seen. Mild vascular congestion is noted with minimal interstitial edema. No bony abnormality is seen. Hyperinflation of the lungs is noted. IMPRESSION: COPD and changes of prior granulomatous disease. Mild vascular congestion with interstitial edema. Electronically Signed   By: Inez Catalina M.D.   On: 12/03/2017 16:05   Ct Abdomen Pelvis W Contrast  Result Date: 12/03/2017 CLINICAL DATA:  Constant lower abdominal pain, loose stools and nausea. Recently diagnosed with diverticulitis. Smoker. EXAM: CT ABDOMEN AND PELVIS WITH CONTRAST TECHNIQUE: Multidetector CT imaging of the abdomen and pelvis was performed using the standard protocol following bolus administration of intravenous contrast. CONTRAST:  157mL OMNIPAQUE IOHEXOL 300 MG/ML  SOLN COMPARISON:  11/28/2017, 06/04/2010 and 05/13/2010. FINDINGS: Lower chest: 4.8 mm right lower lobe nodule on image number 2 series 4, not present on 06/04/2010. A tiny peripheral right lower lobe calcified granuloma is unchanged compared to the previous examinations as well as a tiny peripheral right lower lobe subpleural nodule, on images 8 and 5 series 4 respectively. Hepatobiliary: No focal liver abnormality is seen. Status post cholecystectomy. No biliary dilatation. Pancreas: Unremarkable. No pancreatic ductal dilatation or surrounding inflammatory changes. Spleen: Normal in size without focal abnormality. Adrenals/Urinary Tract: Tiny upper pole left renal calculus. Normal appearing right kidney, ureters and urinary bladder. Normal appearing adrenal glands. Stomach/Bowel: Small hiatal hernia. Normal  appearing small bowel and appendix. Multiple colonic diverticula. Diffuse mid and distal sigmoid colon wall thickening and pericolonic soft tissue stranding with mild progression. No free peritoneal air seen. Small amount of free peritoneal fluid in the inferior pelvis with mild partially surrounding membrane enhancement adjacent to the inflammatory changes in the sigmoid colon. The remainder of the fluid does not have surrounding membrane enhancement . Vascular/Lymphatic: Mild atheromatous arterial calcifications without aneurysm or enlarged lymph nodes. Reproductive: Status post hysterectomy. No adnexal masses. Other: Midline surgical scar. No hernia seen Musculoskeletal: Lumbar and lower thoracic spine degenerative changes. Interbody bone plugs at the L4-5 and L5-S1 levels. Bilateral L5 pars interarticularis defects with associated grade 1 anterolisthesis at the L5-S1 level, without significant change. IMPRESSION: 1. Mildly progressive changes of diverticulitis involving the mid and distal sigmoid colon without abscess. 2. Small amount of free peritoneal fluid in the inferior pelvis, increased with interval mild partially surrounding membrane enhancement adjacent to the area of diverticulitis in the sigmoid colon, without a discrete walled-off abscess at  this time. 3. 4.8 mm right lower lobe lung nodule. Recommend noncontrast chest CT can be in 12 months since the patient is a smoker. This recommendation follows the consensus statement: Guidelines for Management of Incidental Pulmonary Nodules Detected on CT Images: From the Fleischner Society 2017; Radiology 2017; 284:228-243. 4. Tiny, nonobstructing upper pole left renal calculus. 5. Bilateral L5 spondylolysis with associated grade 1 spondylolisthesis at the L5-S1 level. Electronically Signed   By: Claudie Revering M.D.   On: 12/03/2017 15:42     IMPRESSION AND PLAN:   47 year old female with past medical history of COPD, chronic pain, neuropathy, anxiety who  presents to the hospital due to abdominal pain nausea and vomiting.  1.  Acute diverticulitis-this is the cause of patient's abdominal pain nausea and vomiting.  Patient CT scan of the abdomen suggestive of sigmoid diverticulitis without evidence of abscess or perforation.  Patient has failed outpatient oral antibiotics with Augmentin. - Admit the patient to the hospital, treated supportively with IV pain medication, antiemetics. - We will place on IV meropenem, follow clinically.  Placed on clear liquid diet for now.  If not improving consider getting a surgical consult.  2.  COPD-no acute exacerbation-continue Spiriva, Dulera, Daliresp.  3.  Neuropathy-continue gabapentin.  4.  Anxiety-continue Xanax as needed.    All the records are reviewed and case discussed with ED provider. Management plans discussed with the patient, family and they are in agreement.  CODE STATUS: Full code  TOTAL TIME TAKING CARE OF THIS PATIENT: 40 minutes.    Henreitta Leber M.D on 12/03/2017 at 5:18 PM  Between 7am to 6pm - Pager - [phone]  After 6pm go to [email or web address] - password [email or web address] [facility]  [facility]  Office  [phone]  CC: Primary care physician; Sinda Du, MD

## 2017-12-03 NOTE — ED Notes (Signed)
Pt dropping sats on 2 L into mid 80's. Increased to 4 L [place] and informed to take deep breaths, increased to 94%. Will continue to monitor.

## 2017-12-03 NOTE — ED Provider Notes (Signed)
CT scan shows progression of diverticulitis without abscess or perforation. Given concern for worsening infection even though the patient was on antibiotics will plan on IV antibiotics and admission. Discussed this with the patient.    Nance Pear, MD 12/03/17 [phone]

## 2017-12-03 NOTE — ED Triage Notes (Signed)
Pt was seen here and diagnosed with diverticulitis Monday but states she has had no relief at home. Pt denies any new complaints but states she still has constant lower abdominal pain/loose stools, and nausea. In triage pt's oxygen saturation on room air is 87%. Pt states she has a hx of COPD and was placed on o2 when she was here Monday. Pt states the only supplemental o2 she uses is at night. Pt placed on 2L in triage.

## 2017-12-03 NOTE — ED Notes (Signed)
ED TO INPATIENT HANDOFF REPORT  Name/Age/Gender Misty Green 47 y.o. female  Code Status Code Status History    Date Active Date Inactive Code Status Order ID Comments User Context   03/22/2016 0059 03/24/2016 1705 Full Code [id number]  Norval Morton, MD ED      Home/SNF/Other [facility]  Chief Complaint abd pain here Monday worse  Level of Care/Admitting Diagnosis ED Disposition    ED Disposition Condition [place]: [place] [[id number]]  Level of Care: Med-Surg [16]  Diagnosis: Diverticulitis [[id number]]  Admitting Physician: Henreitta Leber [[id number]]  Attending Physician: Henreitta Leber [[id number]]  Estimated length of stay: past midnight tomorrow  Certification:: I certify this patient will need inpatient services for at least 2 midnights  PT Class (Do Not Modify): Inpatient [101]  PT Acc Code (Do Not Modify): Private [1]       Medical History Past Medical History:  Diagnosis Date  . COPD (chronic obstructive pulmonary disease) ([place])   . Current smoker   . Opiate abuse, continuous ([facility])     Allergies Allergies  Allergen Reactions  . Other Nausea And Vomiting    Anti-depressent that starts with t    IV Location/Drains/Wounds Patient Lines/Drains/Airways Status   Active Line/Drains/Airways    Name:   Placement date:   Placement time:   Site:   Days:   Peripheral IV 12/03/17 Right Antecubital   12/03/17    1423    Antecubital   less than 1          Labs/Imaging Results for orders placed or performed during the hospital encounter of 12/03/17 (from the past 48 hour(s))  Lipase, blood     Status: None   Collection Time: 12/03/17  2:15 PM  Result Value Ref Range   Lipase 20 11 - 51 U/L    Comment: Performed at [facility], [address] [place], [place] [address]  Comprehensive metabolic panel     Status: Abnormal   Collection Time: 12/03/17  2:15 PM  Result Value Ref Range   Sodium 145 135 - 145  mmol/L   Potassium 3.4 (L) 3.5 - 5.1 mmol/L   Chloride 98 98 - 111 mmol/L   CO2 33 (H) 22 - 32 mmol/L   Glucose, Bld 95 70 - 99 mg/dL   BUN 6 6 - 20 mg/dL   Creatinine, Ser 0.39 (L) 0.44 - 1.00 mg/dL   Calcium 9.2 8.9 - 10.3 mg/dL   Total Protein 7.8 6.5 - 8.1 g/dL   Albumin 3.2 (L) 3.5 - 5.0 g/dL   AST 15 15 - 41 U/L   ALT 12 0 - 44 U/L   Alkaline Phosphatase 53 38 - 126 U/L   Total Bilirubin 0.6 0.3 - 1.2 mg/dL   GFR calc non Af Amer >60 >60 mL/min   GFR calc Af Amer >60 >60 mL/min    Comment: (NOTE) The eGFR has been calculated using the CKD EPI equation. This calculation has not been validated in all clinical situations. eGFR's persistently <60 mL/min signify possible Chronic Kidney Disease.    Anion gap 14 5 - 15    Comment: Performed at [facility], [address] [place], [place] [address]  CBC     Status: Abnormal   Collection Time: 12/03/17  2:15 PM  Result Value Ref Range   WBC 10.3 4.0 - 10.5 K/uL   RBC 4.08 3.87 - 5.11 MIL/uL   Hemoglobin  12.6 12.0 - 15.0 g/dL   HCT 41.6 36.0 - 46.0 %   MCV 102.0 (H) 80.0 - 100.0 fL   MCH 30.9 26.0 - 34.0 pg   MCHC 30.3 30.0 - 36.0 g/dL   RDW 12.3 11.5 - 15.5 %   Platelets 399 150 - 400 K/uL   nRBC 0.0 0.0 - 0.2 %    Comment: Performed at [facility], [address] [place], [place] [address]  Urinalysis, Complete w Microscopic     Status: Abnormal   Collection Time: 12/03/17  4:23 PM  Result Value Ref Range   Color, Urine YELLOW (A) YELLOW   APPearance CLEAR (A) CLEAR   Specific Gravity, Urine 1.041 (H) 1.005 - 1.030   pH 5.0 5.0 - 8.0   Glucose, UA NEGATIVE NEGATIVE mg/dL   Hgb urine dipstick NEGATIVE NEGATIVE   Bilirubin Urine NEGATIVE NEGATIVE   Ketones, ur NEGATIVE NEGATIVE mg/dL   Protein, ur 30 (A) NEGATIVE mg/dL   Nitrite NEGATIVE NEGATIVE   Leukocytes, UA NEGATIVE NEGATIVE   RBC / HPF 0-5 0 - 5 RBC/hpf   WBC, UA 0-5 0 - 5 WBC/hpf   Bacteria, UA NONE SEEN NONE SEEN   Squamous  Epithelial / LPF 0-5 0 - 5   Mucus PRESENT     Comment: Performed at [facility], [address] [place], [place] [address]  C difficile quick scan w PCR reflex     Status: None   Collection Time: 12/03/17  4:23 PM  Result Value Ref Range   C Diff antigen NEGATIVE NEGATIVE   C Diff toxin NEGATIVE NEGATIVE   C Diff interpretation No C. difficile detected.     Comment: Performed at [facility], [address] [place], [place] [address]   Dg Chest 2 View  Result Date: 12/03/2017 CLINICAL DATA:  Hypoxia EXAM: CHEST - 2 VIEW COMPARISON:  11/28/2017 FINDINGS: Cardiac shadow is stable. Multiple calcified granulomas are again identified. No focal infiltrate is seen. Mild vascular congestion is noted with minimal interstitial edema. No bony abnormality is seen. Hyperinflation of the lungs is noted. IMPRESSION: COPD and changes of prior granulomatous disease. Mild vascular congestion with interstitial edema. Electronically Signed   By: Inez Catalina M.D.   On: 12/03/2017 16:05   Ct Abdomen Pelvis W Contrast  Result Date: 12/03/2017 CLINICAL DATA:  Constant lower abdominal pain, loose stools and nausea. Recently diagnosed with diverticulitis. Smoker. EXAM: CT ABDOMEN AND PELVIS WITH CONTRAST TECHNIQUE: Multidetector CT imaging of the abdomen and pelvis was performed using the standard protocol following bolus administration of intravenous contrast. CONTRAST:  139m OMNIPAQUE IOHEXOL 300 MG/ML  SOLN COMPARISON:  11/28/2017, 06/04/2010 and 05/13/2010. FINDINGS: Lower chest: 4.8 mm right lower lobe nodule on image number 2 series 4, not present on 06/04/2010. A tiny peripheral right lower lobe calcified granuloma is unchanged compared to the previous examinations as well as a tiny peripheral right lower lobe subpleural nodule, on images 8 and 5 series 4 respectively. Hepatobiliary: No focal liver abnormality is seen. Status post cholecystectomy. No biliary dilatation. Pancreas:  Unremarkable. No pancreatic ductal dilatation or surrounding inflammatory changes. Spleen: Normal in size without focal abnormality. Adrenals/Urinary Tract: Tiny upper pole left renal calculus. Normal appearing right kidney, ureters and urinary bladder. Normal appearing adrenal glands. Stomach/Bowel: Small hiatal hernia. Normal appearing small bowel and appendix. Multiple colonic diverticula. Diffuse mid and distal sigmoid colon wall thickening and pericolonic soft tissue stranding with mild progression. No free peritoneal air seen. Small amount  of free peritoneal fluid in the inferior pelvis with mild partially surrounding membrane enhancement adjacent to the inflammatory changes in the sigmoid colon. The remainder of the fluid does not have surrounding membrane enhancement . Vascular/Lymphatic: Mild atheromatous arterial calcifications without aneurysm or enlarged lymph nodes. Reproductive: Status post hysterectomy. No adnexal masses. Other: Midline surgical scar. No hernia seen Musculoskeletal: Lumbar and lower thoracic spine degenerative changes. Interbody bone plugs at the L4-5 and L5-S1 levels. Bilateral L5 pars interarticularis defects with associated grade 1 anterolisthesis at the L5-S1 level, without significant change. IMPRESSION: 1. Mildly progressive changes of diverticulitis involving the mid and distal sigmoid colon without abscess. 2. Small amount of free peritoneal fluid in the inferior pelvis, increased with interval mild partially surrounding membrane enhancement adjacent to the area of diverticulitis in the sigmoid colon, without a discrete walled-off abscess at this time. 3. 4.8 mm right lower lobe lung nodule. Recommend noncontrast chest CT can be in 12 months since the patient is a smoker. This recommendation follows the consensus statement: Guidelines for Management of Incidental Pulmonary Nodules Detected on CT Images: From the Fleischner Society 2017; Radiology 2017; 284:228-243. 4. Tiny,  nonobstructing upper pole left renal calculus. 5. Bilateral L5 spondylolysis with associated grade 1 spondylolisthesis at the L5-S1 level. Electronically Signed   By: Claudie Revering M.D.   On: 12/03/2017 15:42    Pending Labs Unresulted Labs (From admission, onward)    Start     Ordered   12/03/17 1408  Gastrointestinal Panel by PCR , Stool  (Gastrointestinal Panel by PCR, Stool)  Once,   STAT     12/03/17 1408   Signed and Held  HIV antibody (Routine Testing)  Once,   R     Signed and Held   Signed and Held  Basic metabolic panel  Tomorrow morning,   R     Signed and Held   Signed and Held  CBC  Tomorrow morning,   R     Signed and Held   Signed and Held  CBC  (enoxaparin (LOVENOX)    CrCl >/= 30 ml/min)  Once,   R    Comments:  Baseline for enoxaparin therapy IF NOT ALREADY DRAWN.  Notify MD if PLT < 100 K.    Signed and Held   Signed and Held  Creatinine, serum  (enoxaparin (LOVENOX)    CrCl >/= 30 ml/min)  Once,   R    Comments:  Baseline for enoxaparin therapy IF NOT ALREADY DRAWN.    Signed and Held   Signed and Held  Creatinine, serum  (enoxaparin (LOVENOX)    CrCl >/= 30 ml/min)  Weekly,   R    Comments:  while on enoxaparin therapy    Signed and Held          Vitals/Pain Today's Vitals   12/03/17 1700 12/03/17 1714 12/03/17 1730 12/03/17 1801  BP: 113/70  123/83 107/64  Pulse: 80  80 78  Resp:   18 18  Temp:      TempSrc:      SpO2: 91%  93% 95%  Weight:      Height:      PainSc:  5   6     Isolation Precautions Enteric precautions (UV disinfection)  Medications Medications  ondansetron (ZOFRAN) injection 4 mg (0 mg Intravenous Hold 12/03/17 1423)  meropenem (MERREM) 1 g in sodium chloride 0.9 % 100 mL IVPB (has no administration in time range)  ondansetron (ZOFRAN-ODT) disintegrating tablet 4 mg (4  mg Oral Given 12/03/17 1352)  sodium chloride 0.9 % bolus 1,000 mL (0 mLs Intravenous Stopped 12/03/17 1505)  fentaNYL (SUBLIMAZE) injection 100 mcg (100 mcg  Intravenous Given 12/03/17 1422)  ipratropium-albuterol (DUONEB) 0.5-2.5 (3) MG/3ML nebulizer solution 6 mL (6 mLs Nebulization Given 12/03/17 1448)  methylPREDNISolone sodium succinate (SOLU-MEDROL) 125 mg/2 mL injection 125 mg (125 mg Intravenous Given 12/03/17 1448)  iohexol (OMNIPAQUE) 300 MG/ML solution 100 mL (100 mLs Intravenous Contrast Given 12/03/17 1511)  fentaNYL (SUBLIMAZE) injection 100 mcg (100 mcg Intravenous Given 12/03/17 1641)  piperacillin-tazobactam (ZOSYN) IVPB 3.375 g ( Intravenous Stopped 12/03/17 1750)    Mobility Walks  Care handoff to [facility] on [place]. Pt transported to [facility] [address] by Edison Nasuti NT.

## 2017-12-03 NOTE — Progress Notes (Signed)
Patient has home cpap for use at bedtime. Unit in tact. Has been put together by spouse. Provided oxygen adapter and supply tubing.  Spouse left at home. Provided sterile water. No rips or tears noticed in circuit or mask.

## 2017-12-04 ENCOUNTER — Encounter: Payer: Self-pay | Admitting: Surgery

## 2017-12-04 ENCOUNTER — Inpatient Hospital Stay: Payer: 59

## 2017-12-04 DIAGNOSIS — K5732 Diverticulitis of large intestine without perforation or abscess without bleeding: Principal | ICD-10-CM

## 2017-12-04 LAB — BASIC METABOLIC PANEL
Anion gap: 11 (ref 5–15)
BUN: 5 mg/dL — ABNORMAL LOW (ref 6–20)
CHLORIDE: 98 mmol/L (ref 98–111)
CO2: 35 mmol/L — ABNORMAL HIGH (ref 22–32)
CREATININE: 0.43 mg/dL — AB (ref 0.44–1.00)
Calcium: 8.9 mg/dL (ref 8.9–10.3)
GFR calc Af Amer: >60 mL/min (ref >60)
GFR calc non Af Amer: >60 mL/min (ref >60)
Glucose, Bld: 117 mg/dL — ABNORMAL HIGH (ref 70–99)
Potassium: 3.6 mmol/L (ref 3.5–5.1)
SODIUM: 144 mmol/L (ref 135–145)

## 2017-12-04 LAB — CBC
HEMATOCRIT: 39.6 % (ref 36.0–46.0)
HEMOGLOBIN: 11.9 g/dL — AB (ref 12.0–15.0)
MCH: 30.8 pg (ref 26.0–34.0)
MCHC: 30.1 g/dL (ref 30.0–36.0)
MCV: 102.6 fL — ABNORMAL HIGH (ref 80.0–100.0)
Platelets: 390 10*3/uL (ref 150–400)
RBC: 3.86 MIL/uL — ABNORMAL LOW (ref 3.87–5.11)
RDW: 12.4 % (ref 11.5–15.5)
WBC: 7.2 10*3/uL (ref 4.0–10.5)
nRBC: 0 % (ref 0.0–0.2)

## 2017-12-04 MED ORDER — MORPHINE SULFATE (PF) 2 MG/ML IV SOLN
2.0000 mg | INTRAVENOUS | Status: DC | PRN
  Administered 2017-12-04 – 2017-12-10 (×23): 2 mg via INTRAVENOUS
  Filled 2017-12-04 (×23): qty 1

## 2017-12-04 MED ORDER — ACETAMINOPHEN 500 MG PO TABS
1000.0000 mg | ORAL_TABLET | Freq: Four times a day (QID) | ORAL | Status: AC
  Administered 2017-12-04 – 2017-12-05 (×3): 1000 mg via ORAL
  Administered 2017-12-05: 500 mg via ORAL
  Administered 2017-12-05 – 2017-12-09 (×15): 1000 mg via ORAL
  Filled 2017-12-04 (×21): qty 2

## 2017-12-04 MED ORDER — OXYCODONE HCL 5 MG PO TABS
5.0000 mg | ORAL_TABLET | ORAL | Status: DC | PRN
  Administered 2017-12-04 – 2017-12-10 (×19): 5 mg via ORAL
  Filled 2017-12-04 (×20): qty 1

## 2017-12-04 MED ORDER — BOOST / RESOURCE BREEZE PO LIQD CUSTOM
1.0000 | Freq: Three times a day (TID) | ORAL | Status: DC
  Administered 2017-12-05 – 2017-12-07 (×2): 1 via ORAL

## 2017-12-04 MED ORDER — SENNA 8.6 MG PO TABS
1.0000 | ORAL_TABLET | Freq: Every day | ORAL | Status: DC
  Administered 2017-12-04: 8.6 mg via ORAL
  Filled 2017-12-04: qty 1

## 2017-12-04 MED ORDER — MAGNESIUM HYDROXIDE 400 MG/5ML PO SUSP
30.0000 mL | Freq: Once | ORAL | Status: AC
  Administered 2017-12-04: 30 mL via ORAL
  Filled 2017-12-04: qty 30

## 2017-12-04 MED ORDER — ESCITALOPRAM OXALATE 10 MG PO TABS
10.0000 mg | ORAL_TABLET | Freq: Every day | ORAL | Status: DC
  Administered 2017-12-04 – 2017-12-13 (×9): 10 mg via ORAL
  Filled 2017-12-04 (×10): qty 1

## 2017-12-04 MED ORDER — POLYETHYLENE GLYCOL 3350 17 G PO PACK
17.0000 g | PACK | Freq: Every day | ORAL | Status: DC
  Administered 2017-12-04: 17 g via ORAL
  Filled 2017-12-04: qty 1

## 2017-12-04 MED ORDER — BISACODYL 10 MG RE SUPP
10.0000 mg | Freq: Every day | RECTAL | Status: DC
  Administered 2017-12-04 – 2017-12-05 (×2): 10 mg via RECTAL
  Filled 2017-12-04 (×2): qty 1

## 2017-12-04 MED ORDER — SORBITOL 70 % SOLN
960.0000 mL | TOPICAL_OIL | Freq: Every day | ORAL | Status: DC
  Administered 2017-12-04: 960 mL via RECTAL
  Filled 2017-12-04 (×3): qty 473

## 2017-12-04 MED ORDER — DOCUSATE SODIUM 100 MG PO CAPS
100.0000 mg | ORAL_CAPSULE | Freq: Two times a day (BID) | ORAL | Status: DC
  Administered 2017-12-04 – 2017-12-05 (×3): 100 mg via ORAL
  Filled 2017-12-04 (×2): qty 1

## 2017-12-04 MED ORDER — FLEET ENEMA 7-19 GM/118ML RE ENEM: 1.0000 | ENEMA | Freq: Once | RECTAL | Status: DC

## 2017-12-04 MED ORDER — KETOROLAC TROMETHAMINE 30 MG/ML IJ SOLN
30.0000 mg | Freq: Four times a day (QID) | INTRAMUSCULAR | Status: AC
  Administered 2017-12-04 – 2017-12-09 (×20): 30 mg via INTRAVENOUS
  Filled 2017-12-04 (×20): qty 1

## 2017-12-04 NOTE — Plan of Care (Signed)
Pain has been and issue for the patient. PRN and schedule medications have not been helpful. The patient has not had a bowel movement in about 7 days and is constipated per CT scan images. Stools softener and suppository given. Fleet enema has been ordered. Abdominal x-ray has been order as the patient has been twisting and turning with abdominal pain.   Problem: Education: Goal: Knowledge of General Education information will improve Description Including pain rating scale, medication(s)/side effects and non-pharmacologic comfort measures Outcome: Completed/Met   Problem: Health Behavior/Discharge Planning: Goal: Ability to manage health-related needs will improve Outcome: Completed/Met   Problem: Clinical Measurements: Goal: Ability to maintain clinical measurements within normal limits will improve Outcome: Completed/Met Goal: Will remain free from infection Outcome: Completed/Met Goal: Diagnostic test results will improve Outcome: Completed/Met Goal: Respiratory complications will improve Outcome: Completed/Met Goal: Cardiovascular complication will be avoided Outcome: Completed/Met   Problem: Activity: Goal: Risk for activity intolerance will decrease Outcome: Completed/Met   Problem: Nutrition: Goal: Adequate nutrition will be maintained Outcome: Completed/Met   Problem: Coping: Goal: Level of anxiety will decrease Outcome: Completed/Met   Problem: Elimination: Goal: Will not experience complications related to bowel motility Outcome: Completed/Met Goal: Will not experience complications related to urinary retention Outcome: Completed/Met   Problem: Pain Managment: Goal: General experience of comfort will improve Outcome: Completed/Met   Problem: Safety: Goal: Ability to remain free from injury will improve Outcome: Completed/Met   Problem: Skin Integrity: Goal: Risk for impaired skin integrity will decrease Outcome: Completed/Met

## 2017-12-04 NOTE — Progress Notes (Signed)
[address]- what type of enema would you like to order or do you want to order lactulose. Pain continues to be a big issue for the patient pain 9/10. Cory Roughen [phone]

## 2017-12-04 NOTE — Consult Note (Signed)
SURGICAL CONSULTATION NOTE (initial) - cpt: 99254  HISTORY OF PRESENT ILLNESS (HPI):  47 y.o. female presented to [facility] ED yesterday for evaluation of abdominal pain. Patient reports a long history of chronic constipation for which she takes 2 Colace at night and frequently strains to pass 2 - 3 loose BM's per day, comprised of mostly "mucus" without any "normal" BM's. Over the past 3 weeks, patient's constipation has worsened, and she developed progressively worsening LLQ abdominal pain with feeling "bloated", nausea, and non-bloody emesis, for which she presented to [facility] ED 6 days ago, at which time she was diagnosed with her first episode of non-perforated acute sigmoid colonic diverticulitis without abscess and hospital admission for IV antibiotics was offered. Patient instead requested discharge home with oral antibiotics and returned yesterday with unimproved severe cramping abdominal pain. Patient has a history of chronic narcotics abuse, but says she has not used opiates x 19 months until her current hospital admission. She also recently completed a prednisone taper for her COPD, and her last colonoscopy was >10 years ago, although she is scheduled for one in December. Patient otherwise says milk of magnesia has previously worked well for her constipation, and she currently denies any further N/V, fever/chills, CP, or SOB, requests to minimize narcotics.  Surgery is consulted by medical physician Dr. Benjie Karvonen in this context for evaluation and management of severe constipation and acute diverticulitis.  PAST MEDICAL HISTORY (PMH):  Past Medical History:  Diagnosis Date  . COPD (chronic obstructive pulmonary disease) ([place])   . Current smoker   . Opiate abuse, continuous ([place])     PAST SURGICAL HISTORY ([place]):  Past Surgical History:  Procedure Laterality Date  . ABDOMINAL HYSTERECTOMY    . BACK SURGERY    . cesction    . INCISIONAL HERNIA REPAIR     lower midline laparotomy incision  (hysterectomy), repaired with mesh  . LAPAROSCOPIC CHOLECYSTECTOMY  2012  . ORIF WRIST FRACTURE Right 08/25/2015   Procedure: OPEN REDUCTION INTERNAL FIXATION (ORIF) WRIST FRACTURE;  Surgeon: Corky Mull, MD;  Location: [facility] ORS;  Service: Orthopedics;  Laterality: Right;    MEDICATIONS:  Prior to Admission medications   Medication Sig Start Date End Date Taking? Authorizing Provider  albuterol (PROVENTIL HFA;VENTOLIN HFA) 108 (90 Base) MCG/ACT inhaler Inhale into the lungs every 6 (six) hours as needed for wheezing or shortness of breath.    [provider]  ALPRAZolam Duanne Moron) 1 MG tablet Take 1 mg by mouth 4 (four) times daily.    [provider]  amoxicillin-clavulanate (AUGMENTIN) 875-125 MG tablet Take 1 tablet by mouth 2 (two) times daily for 14 days. 11/28/17 12/12/17  Harvest Dark, MD  Ascorbic Acid (VITAMIN C) 1000 MG tablet Take 1,000 mg by mouth daily.    [provider]  dicyclomine (BENTYL) 20 MG tablet Take 1 tablet (20 mg total) by mouth 3 (three) times daily as needed for spasms. 04/16/16 04/21/16  Lavonia Drafts, MD  diphenhydrAMINE (BENADRYL) 25 mg capsule Take 1 capsule (25 mg total) by mouth every 6 (six) hours as needed for sleep. 04/16/16 04/20/16  Lavonia Drafts, MD  diphenhydramine-acetaminophen (TYLENOL PM) 25-500 MG TABS tablet Take 2 tablets by mouth at bedtime.    [provider]  docusate sodium (COLACE) 100 MG capsule Take 300 mg by mouth at bedtime.    [provider]  Fluticasone-Salmeterol (ADVAIR) 250-50 MCG/DOSE AEPB Inhale 1 puff into the lungs 2 (two) times daily.    [provider]  furosemide (LASIX)  40 MG tablet Take 40 mg by mouth daily as needed for fluid.     [provider]  gabapentin (NEURONTIN) 300 MG capsule Take 300 mg by mouth 3 (three) times daily.    [provider]  Multiple Vitamins-Minerals (MULTIVITAMIN WITH MINERALS) tablet Take 1 tablet by mouth daily.    [provider]  ondansetron (ZOFRAN ODT) 4 MG disintegrating tablet Take 1 tablet (4 mg total) by mouth every 8 (eight) hours as needed for nausea or vomiting. 04/16/16   Lavonia Drafts, MD  Oxycodone HCl 20 MG TABS Take 20 mg by mouth 4 (four) times daily as needed (pain).    [provider]  predniSONE (DELTASONE) 10 MG tablet Take 4 tabs for 3 days, then 3 tabs for 3 days, then 2 tabs for 3 days, then 1 tab for 3 days, then 1/2 tab for 4 days. 03/24/16   Dessa Phi, DO  promethazine (PHENERGAN) 25 MG tablet Take 1 tablet (25 mg total) by mouth every 6 (six) hours as needed for nausea or vomiting. 08/21/15   Beers, Pierce Crane, PA-C  roflumilast (DALIRESP) 500 MCG TABS tablet Take 500 mcg by mouth daily.    [provider]  tiotropium (SPIRIVA) 18 MCG inhalation capsule Place 18 mcg into inhaler and inhale daily.    [provider]  zolpidem (AMBIEN) 10 MG tablet Take 10 mg by mouth at bedtime as needed for sleep.    [provider]     ALLERGIES:  Allergies  Allergen Reactions  . Other Nausea And Vomiting    Anti-depressent that starts with t    SOCIAL HISTORY:  Social History   Socioeconomic History  . Marital status: Married    Spouse name: Not on file  . Number of children: Not on file  . Years of education: Not on file  . Highest education level: Not on file  Occupational History  . Not on file  Social Needs  . Financial resource strain: Not on file  . Food insecurity:    Worry: Not on file    Inability: Not on file  . Transportation needs:    Medical: Not on file    Non-medical: Not on file  Tobacco Use  . Smoking status: Current Every Day Smoker    Packs/day: 1.00    Years: 15.00    Pack years: 15.00    Types: Cigarettes  . Smokeless tobacco: Never Used  Substance and Sexual Activity  . Alcohol use: No  . Drug use: Yes    Types: Marijuana    Comment: Yesterday  . Sexual activity: Not on file  Lifestyle  . Physical activity:     Days per week: Not on file    Minutes per session: Not on file  . Stress: Not on file  Relationships  . Social connections:    Talks on phone: Not on file    Gets together: Not on file    Attends religious service: Not on file    Active member of club or organization: Not on file    Attends meetings of clubs or organizations: Not on file    Relationship status: Not on file  . Intimate partner violence:    Fear of current or ex partner: Not on file    Emotionally abused: Not on file    Physically abused: Not on file    Forced sexual activity: Not on file  Other Topics Concern  . Not on file  Social History  Narrative  . Not on file    The patient currently resides (home / rehab facility / nursing home): Home The patient normally is (ambulatory / bedbound): Ambulatory   FAMILY HISTORY:  Family History  Problem Relation Age of Onset  . COPD Mother   . Hypertension Father   . Diabetes Father     REVIEW OF SYSTEMS:  Constitutional: denies weight loss, fever, chills, or sweats  Eyes: denies any other vision changes, history of eye injury  ENT: denies sore throat, hearing problems  Respiratory: denies shortness of breath, wheezing  Cardiovascular: denies chest pain, palpitations  Gastrointestinal: abdominal pain, N/V, and bowel function as per HPI Genitourinary: denies burning with urination or urinary frequency Musculoskeletal: denies any other joint pains or cramps  Skin: denies any other rashes or skin discolorations  Neurological: denies any other headache, dizziness, weakness  Psychiatric: denies any other depression, anxiety   All other review of systems were negative   VITAL SIGNS:  Temp:  [98.1 F (36.7 C)-98.6 F (37 C)] 98.2 F (36.8 C) (11/17 1223) Pulse Rate:  [66-90] 66 (11/17 1223) Resp:  [16-20] 20 (11/17 1223) BP: (107-137)/(64-94) 107/64 (11/17 1223) SpO2:  [87 %-100 %] 99 % (11/17 1223)     Height: 5\' 3"  (160 cm) Weight: 86.6 kg BMI (Calculated): 33.84    INTAKE/OUTPUT:  This shift: No intake/output data recorded.  Last 2 shifts: @IOLAST2SHIFTS @   PHYSICAL EXAM:  Constitutional:  -- Obese body habitus  -- Awake, alert, and oriented x3, no apparent distress Eyes:  -- Pupils equally round and reactive to light  -- No scleral icterus, B/L no occular discharge Ear, nose, throat: -- Neck is FROM WNL -- No jugular venous distension  Pulmonary:  -- No wheezes or rhales -- Equal breath sounds bilaterally -- Breathing non-labored at rest Cardiovascular:  -- S1, S2 present  -- No pericardial rubs  Gastrointestinal:  -- Abdomen soft and non-distended with diffuse moderate tenderness to palpation, no guarding or rebound tenderness -- No abdominal masses appreciated, pulsatile or otherwise  Musculoskeletal and Integumentary:  -- Wounds or skin discoloration: None appreciated except well-healed lower midline post-surgical incisional scar -- Extremities: B/L UE and LE FROM, hands and feet warm  Neurologic:  -- Motor function: Intact and symmetric -- Sensation: Intact and symmetric Psychiatric:  -- Mood and affect WNL  Labs:  CBC Latest Ref Rng & Units 12/04/2017 12/03/2017 11/28/2017  WBC 4.0 - 10.5 K/uL 7.2 10.3 12.8(H)  Hemoglobin 12.0 - 15.0 g/dL 11.9(L) 12.6 13.7  Hematocrit 36.0 - 46.0 % 39.6 41.6 44.3  Platelets 150 - 400 K/uL 390 399 299   CMP Latest Ref Rng & Units 12/04/2017 12/03/2017 11/28/2017  Glucose 70 - 99 mg/dL 117(H) 95 99  BUN 6 - 20 mg/dL 5(L) 6 9  Creatinine 0.44 - 1.00 mg/dL 0.43(L) 0.39(L) 0.53  Sodium 135 - 145 mmol/L 144 145 138  Potassium 3.5 - 5.1 mmol/L 3.6 3.4(L) 3.9  Chloride 98 - 111 mmol/L 98 98 94(L)  CO2 22 - 32 mmol/L 35(H) 33(H) 33(H)  Calcium 8.9 - 10.3 mg/dL 8.9 9.2 8.6(L)  Total Protein 6.5 - 8.1 g/dL - 7.8 7.9  Total Bilirubin 0.3 - 1.2 mg/dL - 0.6 0.7  Alkaline Phos 38 - 126 U/L - 53 61  AST 15 - 41 U/L - 15 16  ALT 0 - 44 U/L - 12 16   Imaging studies:  CT Abdomen and Pelvis with  Contrast (12/03/2017) - personally reviewed with patient and her  husband bedside and compared with prior study 1. Mildly progressive changes of diverticulitis involving the mid and distal sigmoid colon without abscess. 2. Small amount of free peritoneal fluid in the inferior pelvis, increased with interval mild partially surrounding membrane enhancement adjacent to the area of diverticulitis in the sigmoid colon, without a discrete walled-off abscess at this time. 3. 4.8 mm right lower lobe lung nodule. Recommend noncontrast chest CT can be in 12 months since the patient is a smoker. 4. Tiny, nonobstructing upper pole left renal calculus. 5. Bilateral L5 spondylolysis with associated grade 1 spondylolisthesis at the L5-S1 level.  CT Abdomen and Pelvis with Contrast (11/28/2017) - personally reviewed and discussed with patient 1. Focal wall thickening involving the sigmoid colon with moderate surrounding inflammatory changes, suspect acute diverticulitis given the presence of diffuse diverticular disease of the colon, less likely colitis of infectious or inflammatory etiology. No intramural air. No free air. 2. Small free fluid in the pelvis.  Assessment/Plan: (ICD-10's: K75.32) 47 y.o. female with progression of first episode sigmoid colonic diverticulitis without appreciable peri-colonic abscess, complicated by severe chronic constipation and by pertinent comorbidities including history of until recently chronic narcotics/opiates abuse/dependence, obesity (BMI 34), COPD, and chronic ongoing tobacco abuse (smoking).               - clear liquids only for now, IV fluids             - IV antibiotics as per primary medical team             - pain control prn, minimize narcotics (orders modified accordingly)  - progressively aggressive bowel regimen required, discussed with patient, and ordered  - will start with SMOG enemas, colace, and milk of magnesia, but will likely benefit from bowel  prep (GoLytely, etc) prior to discharge             - possibility of surgery with partial colectomy and likely colostomy also discussed if doesn't improve/resolve with antibiotics             - elective outpatient elective sigmoid colectomy discussed, but will first definitely require overdue colonoscopy             - when tolerating PO, will need to maintain hydration + initially low fiber x 6 weeks, then high fiber diet  - medical management of comorbidities as per primary medical team             - DVT prophylaxis, ambulation encouraged  All of the above findings and recommendations were discussed with the patient and her husband, and all of patient's and her family's questions were answered to their expressed satisfaction.  Thank you for the opportunity to participate in this patient's care.   -- Marilynne Drivers Rosana Hoes, MD, [place]: [facility] - Partnering for exceptional care. Office: [phone]

## 2017-12-04 NOTE — Progress Notes (Signed)
[place] at [place] NAME: Misty Green    MR#:  [id number]  DATE OF BIRTH:  11-Sep-1970  SUBJECTIVE:   Patient here with diverticulitis.  She was treated as an outpatient however failed outpatient therapy.  REVIEW OF SYSTEMS:    Review of Systems  Constitutional: Negative for fever, chills weight loss HENT: Negative for ear pain, nosebleeds, congestion, facial swelling, rhinorrhea, neck pain, neck stiffness and ear discharge.   Respiratory: Negative for cough, shortness of breath, wheezing  Cardiovascular: Negative for chest pain, palpitations and leg swelling.  Gastrointestinal: Negative for heartburn, abdominal pain (improved), vomiting, diarrhea ++ consitpation Genitourinary: Negative for dysuria, urgency, frequency, hematuria Musculoskeletal: Negative for back pain or joint pain Neurological: Negative for dizziness, seizures, syncope, focal weakness,  numbness and headaches.  Hematological: Does not bruise/bleed easily.  Psychiatric/Behavioral: Negative for hallucinations, confusion, dysphoric mood    Tolerating Diet: yes      DRUG ALLERGIES:   Allergies  Allergen Reactions  . Other Nausea And Vomiting    Anti-depressent that starts with t    VITALS:  Blood pressure 128/65, pulse 82, temperature 98.1 F (36.7 C), temperature source Oral, resp. rate 20, height 5\' 3"  (1.6 m), weight 86.6 kg, SpO2 91 %.  PHYSICAL EXAMINATION:  Constitutional: Appears well-developed and well-nourished. No distress. HENT: Normocephalic. Marland Kitchen Oropharynx is clear and moist.  Eyes: Conjunctivae and EOM are normal. PERRLA, no scleral icterus.  Neck: Normal ROM. Neck supple. No JVD. No tracheal deviation. CVS: RRR, S1/S2 +, no murmurs, no gallops, no carotid bruit.  Pulmonary: Effort and breath sounds normal, no stridor, rhonchi, wheezes, rales.  Abdominal: Soft. BS +,  no distension, tenderness, rebound or guarding.  Musculoskeletal: Normal  range of motion. No edema and no tenderness.  Neuro: Alert. CN 2-12 grossly intact. No focal deficits. Skin: Skin is warm and dry. No rash noted. Psychiatric: Normal mood and affect.      LABORATORY PANEL:   CBC Recent Labs  Lab 12/04/17 0408  WBC 7.2  HGB 11.9*  HCT 39.6  PLT 390   ------------------------------------------------------------------------------------------------------------------  Chemistries  Recent Labs  Lab 12/03/17 1415 12/04/17 0408  NA 145 144  K 3.4* 3.6  CL 98 98  CO2 33* 35*  GLUCOSE 95 117*  BUN 6 5*  CREATININE 0.39* 0.43*  CALCIUM 9.2 8.9  AST 15  --   ALT 12  --   ALKPHOS 53  --   BILITOT 0.6  --    ------------------------------------------------------------------------------------------------------------------  Cardiac Enzymes No results for input(s): TROPONINI in the last 168 hours. ------------------------------------------------------------------------------------------------------------------  RADIOLOGY:  Dg Chest 2 View  Result Date: 12/03/2017 CLINICAL DATA:  Hypoxia EXAM: CHEST - 2 VIEW COMPARISON:  11/28/2017 FINDINGS: Cardiac shadow is stable. Multiple calcified granulomas are again identified. No focal infiltrate is seen. Mild vascular congestion is noted with minimal interstitial edema. No bony abnormality is seen. Hyperinflation of the lungs is noted. IMPRESSION: COPD and changes of prior granulomatous disease. Mild vascular congestion with interstitial edema. Electronically Signed   By: Inez Catalina M.D.   On: 12/03/2017 16:05   Ct Abdomen Pelvis W Contrast  Result Date: 12/03/2017 CLINICAL DATA:  Constant lower abdominal pain, loose stools and nausea. Recently diagnosed with diverticulitis. Smoker. EXAM: CT ABDOMEN AND PELVIS WITH CONTRAST TECHNIQUE: Multidetector CT imaging of the abdomen and pelvis was performed using the standard protocol following bolus administration of intravenous contrast. CONTRAST:  146mL  OMNIPAQUE IOHEXOL 300 MG/ML  SOLN COMPARISON:  11/28/2017,  06/04/2010 and 05/13/2010. FINDINGS: Lower chest: 4.8 mm right lower lobe nodule on image number 2 series 4, not present on 06/04/2010. A tiny peripheral right lower lobe calcified granuloma is unchanged compared to the previous examinations as well as a tiny peripheral right lower lobe subpleural nodule, on images 8 and 5 series 4 respectively. Hepatobiliary: No focal liver abnormality is seen. Status post cholecystectomy. No biliary dilatation. Pancreas: Unremarkable. No pancreatic ductal dilatation or surrounding inflammatory changes. Spleen: Normal in size without focal abnormality. Adrenals/Urinary Tract: Tiny upper pole left renal calculus. Normal appearing right kidney, ureters and urinary bladder. Normal appearing adrenal glands. Stomach/Bowel: Small hiatal hernia. Normal appearing small bowel and appendix. Multiple colonic diverticula. Diffuse mid and distal sigmoid colon wall thickening and pericolonic soft tissue stranding with mild progression. No free peritoneal air seen. Small amount of free peritoneal fluid in the inferior pelvis with mild partially surrounding membrane enhancement adjacent to the inflammatory changes in the sigmoid colon. The remainder of the fluid does not have surrounding membrane enhancement . Vascular/Lymphatic: Mild atheromatous arterial calcifications without aneurysm or enlarged lymph nodes. Reproductive: Status post hysterectomy. No adnexal masses. Other: Midline surgical scar. No hernia seen Musculoskeletal: Lumbar and lower thoracic spine degenerative changes. Interbody bone plugs at the L4-5 and L5-S1 levels. Bilateral L5 pars interarticularis defects with associated grade 1 anterolisthesis at the L5-S1 level, without significant change. IMPRESSION: 1. Mildly progressive changes of diverticulitis involving the mid and distal sigmoid colon without abscess. 2. Small amount of free peritoneal fluid in the inferior  pelvis, increased with interval mild partially surrounding membrane enhancement adjacent to the area of diverticulitis in the sigmoid colon, without a discrete walled-off abscess at this time. 3. 4.8 mm right lower lobe lung nodule. Recommend noncontrast chest CT can be in 12 months since the patient is a smoker. This recommendation follows the consensus statement: Guidelines for Management of Incidental Pulmonary Nodules Detected on CT Images: From the Fleischner Society 2017; Radiology 2017; 284:228-243. 4. Tiny, nonobstructing upper pole left renal calculus. 5. Bilateral L5 spondylolysis with associated grade 1 spondylolisthesis at the L5-S1 level. Electronically Signed   By: Claudie Revering M.D.   On: 12/03/2017 15:42     ASSESSMENT AND PLAN:   47 year old female with a history of COPD, tobacco dependence and anxiety presented to the hospital due to worsening abdominal pain.   1.  Acute sigmoid diverticulitis: CT scan did not show evidence of abscess or perforation.  Patient failed outpatient treatment with Augmentin. Continue IV meropenem. Advance to soft diet. Surgery consultation requested. Consult placed via epic  2.  COPD without signs of exacerbation. Continue inhalers 3. Tobacco dependence: Patient is encouraged to quit smoking. Counseling was provided for 4 minutes.  4.  Neuropathy: Continue gabapentin  5.  Anxiety: Continue PRN Xanax and Lexapro  6.  Constipation: Start stool softeners and enema   Management plans discussed with the patient and she is in agreement.  CODE STATUS: full  TOTAL TIME TAKING CARE OF THIS PATIENT: 30 minutes.     POSSIBLE D/C 1-3 days, DEPENDING ON CLINICAL CONDITION.   Delvin Hedeen M.D on 12/04/2017 at 11:19 AM  Between 7am to 6pm - Pager - [phone] After 6pm go to [email or web address] - password [email or web address] [facility]  Office  [phone]  CC: Primary care physician; Sinda Du, MD  Note: This dictation was  prepared with Dragon dictation along with smaller phrase technology. Any transcriptional errors that result from this process are unintentional.

## 2017-12-04 NOTE — Progress Notes (Signed)
PT Cancellation Note  Patient Details Name: Misty Green MRN: [id number] DOB: 1970/09/20   Cancelled Treatment:    Reason Eval/Treat Not Completed: Pain limiting ability to participate . Patients nurse was giving pain meds.    [address], [place] DPT 12/04/2017, 3:59 PM

## 2017-12-04 NOTE — Progress Notes (Signed)
[address]- Asking for an order of Escitalopram as she takes this medication daily, and Advair. Cory Roughen [phone]

## 2017-12-04 NOTE — Plan of Care (Signed)
Nutrition Education Note  RD consulted for nutrition education regarding nutrition management for diverticulosis/ Diverticulitis.   Met with patient and her husband at bedside. She reports this is the first time she has had diverticulitis. She has also been constipated and has not had a BM for the past 7-8 days. Patient was just ordered for a soft diet today. She did not like the food brought on her lunch tray so she did not each much. Before onset of diverticulitis patient had a good appetite and intake.   RD provided "Low Fiber Nutrition Therapy" handout from the Academy of [employer]. Reviewed patient's dietary recall and discussed ways for pt to meet nutrition goals over the next several weeks. Explained reasons for pt to follow a low fiber diet over the next 4-6 weeks. Reviewed low fiber foods and high fiber foods. Also provided "High Fiber Nutrition Therapy" handout from the Academy of [employer]etics. Discussed best practice for long term management of diverticulosis is a high fiber diet and discussed ways to gradually increase fiber in the diet once patient has completely healed.  Teach back method used. Patient verbalizes understanding of information provided.   Expect good compliance.  Body mass index is Body mass index is 33.83 kg/m.Marland Kitchen Pt meets criteria for obesity class I based on current BMI.  Current diet order is Soft. Labs and medications reviewed. No further nutrition interventions warranted at this time. RD contact information provided. If additional nutrition issues arise, please re-consult RD.  Willey Blade, MS, [place], LDN Office: [phone] Pager: [phone] After Hours/Weekend Pager: [phone]

## 2017-12-05 DIAGNOSIS — K5792 Diverticulitis of intestine, part unspecified, without perforation or abscess without bleeding: Secondary | ICD-10-CM

## 2017-12-05 LAB — HIV ANTIBODY (ROUTINE TESTING W REFLEX): HIV Screen 4th Generation wRfx: NONREACTIVE

## 2017-12-05 MED ORDER — MAGNESIUM CITRATE PO SOLN: 1.0000 | Freq: Once | ORAL | Status: DC

## 2017-12-05 MED ORDER — METRONIDAZOLE IN NACL 5-0.79 MG/ML-% IV SOLN
500.0000 mg | Freq: Three times a day (TID) | INTRAVENOUS | Status: DC
  Administered 2017-12-05 – 2017-12-13 (×24): 500 mg via INTRAVENOUS
  Filled 2017-12-05 (×28): qty 100

## 2017-12-05 MED ORDER — METHYLNALTREXONE BROMIDE 12 MG/0.6ML ~~LOC~~ SOLN
12.0000 mg | Freq: Once | SUBCUTANEOUS | Status: DC
  Filled 2017-12-05: qty 0.6

## 2017-12-05 MED ORDER — LACTULOSE 10 GM/15ML PO SOLN
30.0000 g | Freq: Three times a day (TID) | ORAL | Status: DC
  Administered 2017-12-05: 30 g via ORAL
  Filled 2017-12-05: qty 60

## 2017-12-05 MED ORDER — DOCUSATE SODIUM 100 MG PO CAPS
100.0000 mg | ORAL_CAPSULE | Freq: Two times a day (BID) | ORAL | Status: DC
  Administered 2017-12-07 – 2017-12-09 (×4): 100 mg via ORAL
  Filled 2017-12-05 (×5): qty 1

## 2017-12-05 MED ORDER — SORBITOL 70 % SOLN
960.0000 mL | TOPICAL_OIL | Freq: Every day | ORAL | Status: DC
  Administered 2017-12-05: 960 mL via RECTAL
  Filled 2017-12-05 (×4): qty 473

## 2017-12-05 MED ORDER — SENNA 8.6 MG PO TABS
1.0000 | ORAL_TABLET | Freq: Every day | ORAL | Status: DC
  Administered 2017-12-05: 8.6 mg via ORAL
  Filled 2017-12-05: qty 1

## 2017-12-05 MED ORDER — SODIUM CHLORIDE 0.9 % IV SOLN
2.0000 g | INTRAVENOUS | Status: DC
  Administered 2017-12-05 – 2017-12-12 (×8): 2 g via INTRAVENOUS
  Filled 2017-12-05: qty 2
  Filled 2017-12-05: qty 20
  Filled 2017-12-05 (×3): qty 2
  Filled 2017-12-05: qty 20
  Filled 2017-12-05 (×3): qty 2

## 2017-12-05 MED ORDER — LACTULOSE 10 GM/15ML PO SOLN
30.0000 g | Freq: Three times a day (TID) | ORAL | Status: DC
  Administered 2017-12-07 – 2017-12-09 (×5): 30 g via ORAL
  Filled 2017-12-05 (×6): qty 60

## 2017-12-05 MED ORDER — SODIUM CHLORIDE 0.9 % IV SOLN
INTRAVENOUS | Status: DC | PRN
  Administered 2017-12-05: 500 mL via INTRAVENOUS
  Administered 2017-12-07 – 2017-12-08 (×3): 250 mL via INTRAVENOUS
  Administered 2017-12-09: 400 mL via INTRAVENOUS
  Administered 2017-12-10: 250 mL via INTRAVENOUS
  Administered 2017-12-10: 400 mL via INTRAVENOUS
  Administered 2017-12-11 – 2017-12-13 (×5): 250 mL via INTRAVENOUS

## 2017-12-05 NOTE — Progress Notes (Addendum)
SURGICAL PROGRESS NOTE (cpt: [phone])  Patient seen and examined as described below with surgical PA-C, Ardell Isaacs.  Assessment/Plan: (ICD-10's: K67.32) 47 y.o. female with severe acute on chronic constipation with pan-colonic fecal impaction complicated by progression of first episode sigmoid colonic diverticulitis without appreciable peri-colonic abscess and by comorbidities including history of until recently chronic opiates abuse/dependence, obesity (BMI 34), COPD, and chronic ongoing tobacco abuse (smoking).   - clear liquids only for now, IVfluids - agree with pharmacy recommendation to focus antibiotics - pain control prn, minimize narcotics (orders modified accordingly)             - progressively aggressive bowel regimen required, discussed with patient, and ordered             - continue daily SMOG enema, BID colace, and planned for magnesium citrate today, but already received lactulose per hospitalist, will defer additional oral constipation medication today  - anticipate patient will likely benefit from bowel prep (GoLytely, etc) prior to discharge after some initial BM's - possibility of surgery with partial colectomy and likely colostomy also discussed if diferticulitis doesn't improve/resolve with antibiotics and bowel regimen - elective outpatient elective sigmoid colectomy discussed, but will first definitely require overdue colonoscopy - when tolerating PO, will need tomaintain hydration + initially low fiber x 6 weeks, then high fiber diet             - medical management of comorbidities as per primary medical team - DVT prophylaxis, ambulation encouraged  I have personally reviewed the patient's chart, evaluated/examined the patient, proposed the recommended management, and discussed these recommendations with the patient and her family to their expressed satisfaction as well as with  patient's RN and medical physician.  Thank you for the opportunity to participate in this patient's care.  -- Marilynne Drivers Rosana Hoes, MD, [place]: [facility] - Partnering for exceptional care. Office: [phone]      SURGICAL PROGRESS NOTE (cpt [phone])  Hospital Day(s): 2.   Post op day(s):  Marland Kitchen   Interval History: Patient seen and examined, no acute events or new complaints overnight. Patient reports that she continues to have sharp cramping suprapubic and LLQ abdominal pain which has not improved since admission. She endorses associated nausea with the pain, denies fever, chills. She notes that she received her enema yesterday but only went "a small amount of mucus and blood." She does currently feel like she has to have a bowel movement. Tolerating clear liquids but does not feel hungry.   Of note, she does endorse a productive cough this morning. Sputum is clear. She has a history of COPD and recent bronchitis.   Review of Systems:  Constitutional: denies fever, chills  HEENT: + cough, denied congestion  Respiratory: denies any shortness of breath  Cardiovascular: denies chest pain or palpitations  Gastrointestinal: + abdominal pain, + Nausea, denied Vomiting, diarrhea/and bowel function as per interval history Genitourinary: denies burning with urination or urinary frequency Musculoskeletal: denies pain, decreased motor or sensation Integumentary: denies any other rashes or skin discolorations Neurological: denies HA or vision/hearing changes   Vital signs in last 24 hours: [min-max] current  Temp:  [97.6 F (36.4 C)-98.2 F (36.8 C)] 97.6 F (36.4 C) (11/18 0542) Pulse Rate:  [61-66] 61 (11/18 0542) Resp:  [18-20] 18 (11/18 0542) BP: (107-116)/(64-66) 116/66 (11/18 0542) SpO2:  [97 %-99 %] 97 % (11/18 0542)     Height: 5\' 3"  (160 cm) Weight: 86.6 kg BMI (Calculated): 33.84  Intake/Output this shift:  No intake/output data recorded.    Intake/Output last 2 shifts:  @IOLAST2SHIFTS @   Physical Exam:  Constitutional: alert, cooperative and no distress  HENT: normocephalic without obvious abnormality  Eyes: EOM's grossly intact and symmetric  Respiratory: breathing non-labored at rest  Gastrointestinal: soft, tenderness to the LLQ and suprapubic region, and non-distended Musculoskeletal: UE and LE FROM, no edema or wounds, motor and sensation grossly intact, NT     Labs:  CBC Latest Ref Rng & Units 12/04/2017 12/03/2017 11/28/2017  WBC 4.0 - 10.5 K/uL 7.2 10.3 12.8(H)  Hemoglobin 12.0 - 15.0 g/dL 11.9(L) 12.6 13.7  Hematocrit 36.0 - 46.0 % 39.6 41.6 44.3  Platelets 150 - 400 K/uL 390 399 299   CMP Latest Ref Rng & Units 12/04/2017 12/03/2017 11/28/2017  Glucose 70 - 99 mg/dL 117(H) 95 99  BUN 6 - 20 mg/dL 5(L) 6 9  Creatinine 0.44 - 1.00 mg/dL 0.43(L) 0.39(L) 0.53  Sodium 135 - 145 mmol/L 144 145 138  Potassium 3.5 - 5.1 mmol/L 3.6 3.4(L) 3.9  Chloride 98 - 111 mmol/L 98 98 94(L)  CO2 22 - 32 mmol/L 35(H) 33(H) 33(H)  Calcium 8.9 - 10.3 mg/dL 8.9 9.2 8.6(L)  Total Protein 6.5 - 8.1 g/dL - 7.8 7.9  Total Bilirubin 0.3 - 1.2 mg/dL - 0.6 0.7  Alkaline Phos 38 - 126 U/L - 53 61  AST 15 - 41 U/L - 15 16  ALT 0 - 44 U/L - 12 16    Imaging studies: No new pertinent imaging studies   Assessment/Plan: (ICD-10's: K29.32) 47 y.o. female with non-improving clinically stable first episode sigmoid colonic diverticulitis without appreciable peri-colonic abscess, complicated by severe chronic constipation and by pertinent comorbidities including history of until recently chronic narcotics/opiates abuse/dependence, obesity (BMI 34), COPD, and chronic ongoing tobacco abuse (smoking).    - IV antibiotics  - Continue clear liquids + IVF for now   - Pain control PRN (minimize narcotics as much as possible)  - Monitor abdominal examination and on-going bowel function   - Continue SMOG enemas, colace, and oral  anti-constipation medication, but will likely benefit from bowel prep (GoLytely, etc) prior to discharge  - possibility of surgery with partial colectomy and likely colostomy also discussed if doesn't improve/resolve with antibiotics - elective outpatient elective sigmoid colectomy discussed, but will first definitely require overdue colonoscopy - when tolerating PO, will need tomaintain hydration + initially low fiber x 6 weeks, then high fiber diet             - medical management of comorbidities as per primary medical team - DVT prophylaxis, ambulation encouraged    All of the above findings and recommendations were discussed with the patient, and the medical team, and all of patient's questions were answered to her expressed satisfaction.  -- Edison Simon, PA-C [facility] 12/05/2017, 9:06 AM [phone] M-F: 7am - 4pm

## 2017-12-05 NOTE — Progress Notes (Signed)
PT Cancellation Note  Patient Details Name: Misty Green MRN: [id number] DOB: 1970/04/24   Cancelled Treatment:    Reason Eval/Treat Not Completed: PT screened, no needs identified, will sign off.  Pt reports she has been ambulating in room without AD with no instability or weakness.  She has showered independently since arrival at hospital.  Pt is independent at baseline without use of AD and denies any recent falls.  No skilled PT needs identified, PT will sign off.    Collie Siad PT, DPT 12/05/2017, 10:37 AM

## 2017-12-05 NOTE — Progress Notes (Signed)
[facility] [place] NAME: Misty Green    MR#:  [id number]  DATE OF BIRTH:  1970/02/24  SUBJECTIVE:  Still no BP despite aggressive bowel regimen.  REVIEW OF SYSTEMS:    Review of Systems  Constitutional: Negative for fever, chills weight loss HENT: Negative for ear pain, nosebleeds, congestion, facial swelling, rhinorrhea, neck pain, neck stiffness and ear discharge.   Respiratory: Negative for cough, shortness of breath, wheezing  Cardiovascular: Negative for chest pain, palpitations and leg swelling.  Gastrointestinal: Negative for heartburn, ++abdominal pain NO vomiting, diarrhea ++ consitpation Genitourinary: Negative for dysuria, urgency, frequency, hematuria Musculoskeletal: Negative for back pain or joint pain Neurological: Negative for dizziness, seizures, syncope, focal weakness,  numbness and headaches.  Hematological: Does not bruise/bleed easily.  Psychiatric/Behavioral: Negative for hallucinations, confusion, dysphoric mood    Tolerating Diet: yes      DRUG ALLERGIES:   Allergies  Allergen Reactions  . Other Nausea And Vomiting    Anti-depressent that starts with t    VITALS:  Blood pressure 116/66, pulse 61, temperature 97.6 F (36.4 C), temperature source Oral, resp. rate 18, height 5\' 3"  (1.6 m), weight 86.6 kg, SpO2 97 %.  PHYSICAL EXAMINATION:  Constitutional: Appears well-developed and well-nourished. No distress. HENT: Normocephalic. Marland Kitchen Oropharynx is clear and moist.  Eyes: Conjunctivae and EOM are normal. PERRLA, no scleral icterus.  Neck: Normal ROM. Neck supple. No JVD. No tracheal deviation. CVS: RRR, S1/S2 +, no murmurs, no gallops, no carotid bruit.  Pulmonary: Effort and breath sounds normal, no stridor, rhonchi, wheezes, rales.  Abdominal: Soft. BS +,  no distension, tenderness, rebound or guarding.  Musculoskeletal: Normal range of motion. No edema and no tenderness.  Neuro: Alert. CN 2-12  grossly intact. No focal deficits. Skin: Skin is warm and dry. No rash noted. Psychiatric: Normal mood and affect.      LABORATORY PANEL:   CBC Recent Labs  Lab 12/04/17 0408  WBC 7.2  HGB 11.9*  HCT 39.6  PLT 390   ------------------------------------------------------------------------------------------------------------------  Chemistries  Recent Labs  Lab 12/03/17 1415 12/04/17 0408  NA 145 144  K 3.4* 3.6  CL 98 98  CO2 33* 35*  GLUCOSE 95 117*  BUN 6 5*  CREATININE 0.39* 0.43*  CALCIUM 9.2 8.9  AST 15  --   ALT 12  --   ALKPHOS 53  --   BILITOT 0.6  --    ------------------------------------------------------------------------------------------------------------------  Cardiac Enzymes No results for input(s): TROPONINI in the last 168 hours. ------------------------------------------------------------------------------------------------------------------  RADIOLOGY:  Dg Chest 2 View  Result Date: 12/03/2017 CLINICAL DATA:  Hypoxia EXAM: CHEST - 2 VIEW COMPARISON:  11/28/2017 FINDINGS: Cardiac shadow is stable. Multiple calcified granulomas are again identified. No focal infiltrate is seen. Mild vascular congestion is noted with minimal interstitial edema. No bony abnormality is seen. Hyperinflation of the lungs is noted. IMPRESSION: COPD and changes of prior granulomatous disease. Mild vascular congestion with interstitial edema. Electronically Signed   By: Inez Catalina M.D.   On: 12/03/2017 16:05   Dg Abd 1 View  Result Date: 12/04/2017 CLINICAL DATA:  Lower abdominal pain EXAM: ABDOMEN - 1 VIEW COMPARISON:  CT 12/03/2017 FINDINGS: Prior cholecystectomy. Moderate stool burden in the colon. No obstruction or free air. No organomegaly. No acute bony abnormality. IMPRESSION: Moderate stool burden.  Prior cholecystectomy.  No acute findings. Electronically Signed   By: Rolm Baptise M.D.   On: 12/04/2017 17:12   Ct Abdomen Pelvis W Contrast  Result Date:  12/03/2017 CLINICAL DATA:  Constant lower abdominal pain, loose stools and nausea. Recently diagnosed with diverticulitis. Smoker. EXAM: CT ABDOMEN AND PELVIS WITH CONTRAST TECHNIQUE: Multidetector CT imaging of the abdomen and pelvis was performed using the standard protocol following bolus administration of intravenous contrast. CONTRAST:  121mL OMNIPAQUE IOHEXOL 300 MG/ML  SOLN COMPARISON:  11/28/2017, 06/04/2010 and 05/13/2010. FINDINGS: Lower chest: 4.8 mm right lower lobe nodule on image number 2 series 4, not present on 06/04/2010. A tiny peripheral right lower lobe calcified granuloma is unchanged compared to the previous examinations as well as a tiny peripheral right lower lobe subpleural nodule, on images 8 and 5 series 4 respectively. Hepatobiliary: No focal liver abnormality is seen. Status post cholecystectomy. No biliary dilatation. Pancreas: Unremarkable. No pancreatic ductal dilatation or surrounding inflammatory changes. Spleen: Normal in size without focal abnormality. Adrenals/Urinary Tract: Tiny upper pole left renal calculus. Normal appearing right kidney, ureters and urinary bladder. Normal appearing adrenal glands. Stomach/Bowel: Small hiatal hernia. Normal appearing small bowel and appendix. Multiple colonic diverticula. Diffuse mid and distal sigmoid colon wall thickening and pericolonic soft tissue stranding with mild progression. No free peritoneal air seen. Small amount of free peritoneal fluid in the inferior pelvis with mild partially surrounding membrane enhancement adjacent to the inflammatory changes in the sigmoid colon. The remainder of the fluid does not have surrounding membrane enhancement . Vascular/Lymphatic: Mild atheromatous arterial calcifications without aneurysm or enlarged lymph nodes. Reproductive: Status post hysterectomy. No adnexal masses. Other: Midline surgical scar. No hernia seen Musculoskeletal: Lumbar and lower thoracic spine degenerative changes. Interbody  bone plugs at the L4-5 and L5-S1 levels. Bilateral L5 pars interarticularis defects with associated grade 1 anterolisthesis at the L5-S1 level, without significant change. IMPRESSION: 1. Mildly progressive changes of diverticulitis involving the mid and distal sigmoid colon without abscess. 2. Small amount of free peritoneal fluid in the inferior pelvis, increased with interval mild partially surrounding membrane enhancement adjacent to the area of diverticulitis in the sigmoid colon, without a discrete walled-off abscess at this time. 3. 4.8 mm right lower lobe lung nodule. Recommend noncontrast chest CT can be in 12 months since the patient is a smoker. This recommendation follows the consensus statement: Guidelines for Management of Incidental Pulmonary Nodules Detected on CT Images: From the Fleischner Society 2017; Radiology 2017; 284:228-243. 4. Tiny, nonobstructing upper pole left renal calculus. 5. Bilateral L5 spondylolysis with associated grade 1 spondylolisthesis at the L5-S1 level. Electronically Signed   By: Claudie Revering M.D.   On: 12/03/2017 15:42     ASSESSMENT AND PLAN:   46 year old female with a history of COPD, tobacco dependence and anxiety presented to the hospital due to worsening abdominal pain.   1.  Acute sigmoid diverticulitis: CT scan did not show evidence of abscess or perforation.  Patient failed outpatient treatment with Augmentin. Continue IV meropenem. Advance to soft diet. Surgery consultation appreciated. No indication for surgery at this time.   2.  COPD without signs of exacerbation. Continue inhalers  3. Tobacco dependence: Patient is encouraged to quit smoking. Counseling was provided. 4.  Neuropathy: Continue gabapentin  5.  Anxiety: Continue PRN Xanax and Lexapro  6.  Constipation: Continue current bowel regimen and add lactulose and Relistor.    7. 4.8 mm right lower lobe lung nodule. Recommend noncontrast chest CT can be in 12 months since the  patient is a smoker.  Management plans discussed with the patient and she is in agreement.  CODE STATUS: full  TOTAL TIME  TAKING CARE OF THIS PATIENT: 24 minutes.     POSSIBLE D/C 1-2 days, DEPENDING ON CLINICAL CONDITION.   Annamarie Yamaguchi M.D on 12/05/2017 at 11:13 AM  Between 7am to 6pm - Pager - [phone] After 6pm go to [email or web address] - password [email or web address] [facility]  Office  [phone]  CC: Primary care physician; Sinda Du, MD  Note: This dictation was prepared with Dragon dictation along with smaller phrase technology. Any transcriptional errors that result from this process are unintentional.

## 2017-12-05 NOTE — Evaluation (Signed)
Occupational Therapy Evaluation Patient Details Name: Misty Green MRN: [id number] DOB: 01-Oct-1970 Today's Date: 12/05/2017    History of Present Illness Pt admitted for diverticulitis. PMH: COPD, opiate abuse, anxiety and chronic back pain   Clinical Impression   Pt seen for OT evaluation this date. Prior to hospital admission, pt was independent in all ADL/IADL tasks and was driving and taking care of her grandchildren.  Currently pt demonstrates impairments in pain and politely declined moving over to the recliner. Ambulation deferred 2/2 pain. Pt  independent with bathing, grooming, eating, and dressing tasks. Pt noted, she had been up to take a standing shower the previous evening and completed standing grooming tasks at sink.  No further skilled OT services required at this time. Will sign off.     Follow Up Recommendations  No OT follow up    Equipment Recommendations  None recommended by OT    Recommendations for Other Services       Precautions / Restrictions Precautions Precautions: None      Mobility Bed Mobility Overal bed mobility: Independent                Transfers Overall transfer level: Modified independent Equipment used: Rolling walker (2 wheeled)             General transfer comment: Extra effort    Balance Overall balance assessment: No apparent balance deficits (not formally assessed)                                         ADL either performed or assessed with clinical judgement   ADL Overall ADL's : Independent Eating/Feeding: Independent   Grooming: Standing;Independent   Upper Body Bathing: Independent;Standing   Lower Body Bathing: Independent(pt stated took a shower previous evening)   Upper Body Dressing : Independent;Sitting   Lower Body Dressing: Independent;Sit to/from Health and safety inspector Details (indicate cue type and reason): deferred; pt limited by pain           General ADL  Comments: deferred; limited by pain     Vision Baseline Vision/History: Wears glasses Wears Glasses: At all times Patient Visual Report: No change from baseline       Perception     Praxis      Pertinent Vitals/Pain Pain Assessment: 0-10 Pain Score: 9  Pain Location: abdomen Pain Descriptors / Indicators: Constant;Sharp Pain Intervention(s): Monitored during session;Limited activity within patient's tolerance;Premedicated before session     Hand Dominance     Extremity/Trunk Assessment Upper Extremity Assessment Upper Extremity Assessment: Overall WFL for tasks assessed(5/5 grip, shoulder and bi/tricep strength)   Lower Extremity Assessment Lower Extremity Assessment: Defer to PT evaluation;Overall WFL for tasks assessed(4+/5 BLE)       Communication Communication Communication: No difficulties   Cognition Arousal/Alertness: Awake/alert Behavior During Therapy: WFL for tasks assessed/performed Overall Cognitive Status: Within Functional Limits for tasks assessed                                     General Comments       Exercises     Shoulder Instructions      Home Living Family/patient expects to be discharged to:: Private residence Living Arrangements: Spouse/significant other Available Help at Discharge: Family;Neighbor;Available PRN/intermittently Type of Home: House Home Access: Stairs to enter  Entrance Stairs-Number of Steps: 5 back; 4 in front Entrance Stairs-Rails: Can reach both Home Layout: One level     Bathroom Shower/Tub: Teacher, early years/pre: Handicapped height     Home Equipment: None          Prior Functioning/Environment Level of Independence: Independent        Comments: Independent with all ADL/IADL tasks and mobility; takes care of her 3 grandchildren        OT Problem List: Pain      OT Treatment/Interventions:      OT Goals(Current goals can be found in the care plan section) Acute  Rehab OT Goals Patient Stated Goal: to get rid of pain OT Goal Formulation: All assessment and education complete, DC therapy  OT Frequency:     Barriers to D/C:            Co-evaluation              AM-PAC PT "6 Clicks" Daily Activity     Outcome Measure Help from another person eating meals?: None Help from another person taking care of personal grooming?: None Help from another person toileting, which includes using toliet, bedpan, or urinal?: None Help from another person bathing (including washing, rinsing, drying)?: None Help from another person to put on and taking off regular upper body clothing?: None Help from another person to put on and taking off regular lower body clothing?: None 6 Click Score: 24   End of Session Equipment Utilized During Treatment: Gait belt;Rolling walker  Activity Tolerance: Patient tolerated treatment well Patient left: in bed;with call bell/phone within reach;Other (comment)(MD in room)  OT Visit Diagnosis: Pain;Other abnormalities of gait and mobility (R26.89) Pain - part of body: (all over abdomen)                Time: 4401-0272 OT Time Calculation (min): 17 min Charges:     Jadene Pierini OTS}  12/05/2017, 10:03 AM

## 2017-12-06 ENCOUNTER — Encounter: Payer: Self-pay | Admitting: Radiology

## 2017-12-06 ENCOUNTER — Inpatient Hospital Stay: Payer: 59

## 2017-12-06 LAB — CBC WITH DIFFERENTIAL/PLATELET
Abs Immature Granulocytes: 0.03 10*3/uL (ref 0.00–0.07)
BASOS ABS: 0 10*3/uL (ref 0.0–0.1)
Basophils Relative: 1 %
EOS PCT: 3 %
Eosinophils Absolute: 0.2 10*3/uL (ref 0.0–0.5)
HEMATOCRIT: 42.1 % (ref 36.0–46.0)
Hemoglobin: 12.8 g/dL (ref 12.0–15.0)
IMMATURE GRANULOCYTES: 1 %
LYMPHS ABS: 1.5 10*3/uL (ref 0.7–4.0)
LYMPHS PCT: 24 %
MCH: 30.9 pg (ref 26.0–34.0)
MCHC: 30.4 g/dL (ref 30.0–36.0)
MCV: 101.7 fL — AB (ref 80.0–100.0)
MONOS PCT: 8 %
Monocytes Absolute: 0.5 10*3/uL (ref 0.1–1.0)
NRBC: 0 % (ref 0.0–0.2)
Neutro Abs: 4.1 10*3/uL (ref 1.7–7.7)
Neutrophils Relative %: 63 %
Platelets: 416 10*3/uL — ABNORMAL HIGH (ref 150–400)
RBC: 4.14 MIL/uL (ref 3.87–5.11)
RDW: 12.5 % (ref 11.5–15.5)
WBC: 6.3 10*3/uL (ref 4.0–10.5)

## 2017-12-06 MED ORDER — IOPAMIDOL (ISOVUE-300) INJECTION 61%
15.0000 mL | INTRAVENOUS | Status: AC
  Administered 2017-12-06 (×2): 15 mL via ORAL

## 2017-12-06 MED ORDER — HYDROCORTISONE 2.5 % RE CREA
TOPICAL_CREAM | Freq: Three times a day (TID) | RECTAL | Status: DC
  Administered 2017-12-06 – 2017-12-13 (×18): via RECTAL
  Filled 2017-12-06 (×3): qty 28.35

## 2017-12-06 MED ORDER — IOPAMIDOL (ISOVUE-300) INJECTION 61%
100.0000 mL | Freq: Once | INTRAVENOUS | Status: AC | PRN
  Administered 2017-12-06: 100 mL via INTRAVENOUS

## 2017-12-06 MED ORDER — HYDROMORPHONE HCL 1 MG/ML IJ SOLN
0.5000 mg | Freq: Once | INTRAMUSCULAR | Status: AC
  Administered 2017-12-06: 0.5 mg via INTRAVENOUS
  Filled 2017-12-06: qty 0.5

## 2017-12-06 NOTE — Progress Notes (Signed)
[facility] [place] NAME: Misty Green    MR#:  [id number]  DATE OF BIRTH:  [birth year]  SUBJECTIVE:  Patient had 7 bowel movements after smog enema.  Reporting worsening of abdominal pain today.  Seen by surgery  REVIEW OF SYSTEMS:    Review of Systems  Constitutional: Negative for fever, chills weight loss HENT: Negative for ear pain, nosebleeds, congestion, facial swelling, rhinorrhea, neck pain, neck stiffness and ear discharge.   Respiratory: Negative for cough, shortness of breath, wheezing  Cardiovascular: Negative for chest pain, palpitations and leg swelling.  Gastrointestinal: Negative for heartburn, ++abdominal pain NO vomiting, diarrhea ++ consitpation Genitourinary: Negative for dysuria, urgency, frequency, hematuria Musculoskeletal: Negative for back pain or joint pain Neurological: Negative for dizziness, seizures, syncope, focal weakness,  numbness and headaches.  Hematological: Does not bruise/bleed easily.  Psychiatric/Behavioral: Negative for hallucinations, confusion, dysphoric mood    Tolerating Diet: yes      DRUG ALLERGIES:   Allergies  Allergen Reactions  . Other Nausea And Vomiting    Anti-depressent that starts with t    VITALS:  Blood pressure 125/86, pulse 73, temperature 98.9 F (37.2 C), temperature source Oral, resp. rate 18, height 5\' 3"  (1.6 m), weight 86.6 kg, SpO2 94 %.  PHYSICAL EXAMINATION:  Constitutional: Appears well-developed and well-nourished. No distress. HENT: Normocephalic. Marland Kitchen Oropharynx is clear and moist.  Eyes: Conjunctivae and EOM are normal. PERRLA, no scleral icterus.  Neck: Normal ROM. Neck supple. No JVD. No tracheal deviation. CVS: RRR, S1/S2 +, no murmurs, no gallops, no carotid bruit.  Pulmonary: Effort and breath sounds normal, no stridor, rhonchi, wheezes, rales.  Abdominal: Soft. BS +,  no distension, patient has lower abdominal tenderness, no rebound  guarding.   Musculoskeletal: Normal range of motion. No edema and no tenderness.  Neuro: Alert. CN 2-12 grossly intact. No focal deficits. Skin: Skin is warm and dry. No rash noted. Psychiatric: Normal mood and affect.      LABORATORY PANEL:   CBC Recent Labs  Lab 12/06/17 0938  WBC 6.3  HGB 12.8  HCT 42.1  PLT 416*   ------------------------------------------------------------------------------------------------------------------  Chemistries  Recent Labs  Lab 12/03/17 1415 12/04/17 0408  NA 145 144  K 3.4* 3.6  CL 98 98  CO2 33* 35*  GLUCOSE 95 117*  BUN 6 5*  CREATININE 0.39* 0.43*  CALCIUM 9.2 8.9  AST 15  --   ALT 12  --   ALKPHOS 53  --   BILITOT 0.6  --    ------------------------------------------------------------------------------------------------------------------  Cardiac Enzymes No results for input(s): TROPONINI in the last 168 hours. ------------------------------------------------------------------------------------------------------------------  RADIOLOGY:  Dg Chest 2 View  Result Date: 12/03/2017 CLINICAL DATA:  Hypoxia EXAM: CHEST - 2 VIEW COMPARISON:  11/28/2017 FINDINGS: Cardiac shadow is stable. Multiple calcified granulomas are again identified. No focal infiltrate is seen. Mild vascular congestion is noted with minimal interstitial edema. No bony abnormality is seen. Hyperinflation of the lungs is noted. IMPRESSION: COPD and changes of prior granulomatous disease. Mild vascular congestion with interstitial edema. Electronically Signed   By: Inez Catalina M.D.   On: 12/03/2017 16:05   Dg Abd 1 View  Result Date: 12/04/2017 CLINICAL DATA:  Lower abdominal pain EXAM: ABDOMEN - 1 VIEW COMPARISON:  CT 12/03/2017 FINDINGS: Prior cholecystectomy. Moderate stool burden in the colon. No obstruction or free air. No organomegaly. No acute bony abnormality. IMPRESSION: Moderate stool burden.  Prior cholecystectomy.  No acute findings. Electronically Signed  By: Rolm Baptise M.D.   On: 12/04/2017 17:12   Ct Abdomen Pelvis W Contrast  Result Date: 12/03/2017 CLINICAL DATA:  Constant lower abdominal pain, loose stools and nausea. Recently diagnosed with diverticulitis. Smoker. EXAM: CT ABDOMEN AND PELVIS WITH CONTRAST TECHNIQUE: Multidetector CT imaging of the abdomen and pelvis was performed using the standard protocol following bolus administration of intravenous contrast. CONTRAST:  136mL OMNIPAQUE IOHEXOL 300 MG/ML  SOLN COMPARISON:  11/28/2017, 06/04/2010 and 05/13/2010. FINDINGS: Lower chest: 4.8 mm right lower lobe nodule on image number 2 series 4, not present on 06/04/2010. A tiny peripheral right lower lobe calcified granuloma is unchanged compared to the previous examinations as well as a tiny peripheral right lower lobe subpleural nodule, on images 8 and 5 series 4 respectively. Hepatobiliary: No focal liver abnormality is seen. Status post cholecystectomy. No biliary dilatation. Pancreas: Unremarkable. No pancreatic ductal dilatation or surrounding inflammatory changes. Spleen: Normal in size without focal abnormality. Adrenals/Urinary Tract: Tiny upper pole left renal calculus. Normal appearing right kidney, ureters and urinary bladder. Normal appearing adrenal glands. Stomach/Bowel: Small hiatal hernia. Normal appearing small bowel and appendix. Multiple colonic diverticula. Diffuse mid and distal sigmoid colon wall thickening and pericolonic soft tissue stranding with mild progression. No free peritoneal air seen. Small amount of free peritoneal fluid in the inferior pelvis with mild partially surrounding membrane enhancement adjacent to the inflammatory changes in the sigmoid colon. The remainder of the fluid does not have surrounding membrane enhancement . Vascular/Lymphatic: Mild atheromatous arterial calcifications without aneurysm or enlarged lymph nodes. Reproductive: Status post hysterectomy. No adnexal masses. Other: Midline surgical scar. No  hernia seen Musculoskeletal: Lumbar and lower thoracic spine degenerative changes. Interbody bone plugs at the L4-5 and L5-S1 levels. Bilateral L5 pars interarticularis defects with associated grade 1 anterolisthesis at the L5-S1 level, without significant change. IMPRESSION: 1. Mildly progressive changes of diverticulitis involving the mid and distal sigmoid colon without abscess. 2. Small amount of free peritoneal fluid in the inferior pelvis, increased with interval mild partially surrounding membrane enhancement adjacent to the area of diverticulitis in the sigmoid colon, without a discrete walled-off abscess at this time. 3. 4.8 mm right lower lobe lung nodule. Recommend noncontrast chest CT can be in 12 months since the patient is a smoker. This recommendation follows the consensus statement: Guidelines for Management of Incidental Pulmonary Nodules Detected on CT Images: From the Fleischner Society 2017; Radiology 2017; 284:228-243. 4. Tiny, nonobstructing upper pole left renal calculus. 5. Bilateral L5 spondylolysis with associated grade 1 spondylolisthesis at the L5-S1 level. Electronically Signed   By: Claudie Revering M.D.   On: 12/03/2017 15:42     ASSESSMENT AND PLAN:   47 year old female with a history of COPD, tobacco dependence and anxiety presented to the hospital due to worsening abdominal pain.   1.  Acute sigmoid diverticulitis: CT scan did not show evidence of abscess or perforation.  Patient failed outpatient treatment with Augmentin.  Repeat CT scan is consistent with sigmoid diverticulitis/colitis Continue IV Rocephin and Flagyl Pain management and follow-up with surgery. No indication for surgery at this time.  Await clinical improvement   2.  COPD without signs of exacerbation. Continue inhalers  3. Tobacco dependence: Patient is encouraged to quit smoking. Counseling was provided. 4.  Neuropathy: Continue gabapentin  5.  Anxiety: Continue PRN Xanax and Lexapro  6.   Constipation: Continue current bowel regimen   7. 4.8 mm right lower lobe lung nodule. Recommend noncontrast chest CT can be in 12 months  since the patient is a smoker.  Management plans discussed with the patient and she is in agreement.  CODE STATUS: full  TOTAL TIME TAKING CARE OF THIS PATIENT: 35 minutes.     POSSIBLE D/C 1-2 days, DEPENDING ON CLINICAL CONDITION.   Nicholes Mango M.D on 12/06/2017 at 2:23 PM  Between 7am to 6pm - Pager - [phone] After 6pm go to [email or web address] - password [email or web address] [facility]  Office  [phone]  CC: Primary care physician; Sinda Du, MD  Note: This dictation was prepared with Dragon dictation along with smaller phrase technology. Any transcriptional errors that result from this process are unintentional.

## 2017-12-06 NOTE — Progress Notes (Signed)
SURGICAL PROGRESS NOTE (cpt [phone])  Hospital Day(s): 3.   Post op day(s):  Marland Kitchen   Interval History: Patient seen and examined, no acute events or new complaints overnight. Patient reports that she had about 7 bowel movements since yesterday afternoon however she also endorses worsening lower abdominal pain this morning and was found writhing on the toilet this morning. She denied any emesis, fever, or chills. She has not been eating.   Review of Systems:  Constitutional: denies fever, chills  HEENT: denies cough or congestion  Respiratory: denies any shortness of breath  Cardiovascular: denies chest pain or palpitations  Gastrointestinal: + abdominal pain, denied N/V, or diarrhea/and bowel function as per interval history Genitourinary: denies burning with urination or urinary frequency Musculoskeletal: denies pain, decreased motor or sensation Integumentary: denies any other rashes or skin discolorations Neurological: denies HA or vision/hearing changes   Vital signs in last 24 hours: [min-max] current  Temp:  [96.1 F (35.6 C)-98.1 F (36.7 C)] 98.1 F (36.7 C) (11/19 0509) Pulse Rate:  [62-76] 75 (11/19 0509) Resp:  [16-18] 18 (11/19 0509) BP: (113-120)/(70-95) 120/70 (11/19 0509) SpO2:  [91 %-95 %] 95 % (11/19 0509)     Height: 5\' 3"  (160 cm) Weight: 86.6 kg BMI (Calculated): 33.84   Intake/Output this shift:  No intake/output data recorded.   Intake/Output last 2 shifts:  @IOLAST2SHIFTS @   Physical Exam:  Constitutional: alert, cooperative, writhing around bed HENT: normocephalic without obvious abnormality  Eyes: EOM's grossly intact and symmetric  Respiratory: breathing non-labored at rest  Gastrointestinal: soft, tenderness to the LLQ and suprapubic region, and non-distended Musculoskeletal: UE and LE FROM, no edema or wounds, motor and sensation grossly intact, NT    Labs:  CBC Latest Ref Rng & Units 12/04/2017 12/03/2017 11/28/2017  WBC 4.0 - 10.5 K/uL 7.2  10.3 12.8(H)  Hemoglobin 12.0 - 15.0 g/dL 11.9(L) 12.6 13.7  Hematocrit 36.0 - 46.0 % 39.6 41.6 44.3  Platelets 150 - 400 K/uL 390 399 299   CMP Latest Ref Rng & Units 12/04/2017 12/03/2017 11/28/2017  Glucose 70 - 99 mg/dL 117(H) 95 99  BUN 6 - 20 mg/dL 5(L) 6 9  Creatinine 0.44 - 1.00 mg/dL 0.43(L) 0.39(L) 0.53  Sodium 135 - 145 mmol/L 144 145 138  Potassium 3.5 - 5.1 mmol/L 3.6 3.4(L) 3.9  Chloride 98 - 111 mmol/L 98 98 94(L)  CO2 22 - 32 mmol/L 35(H) 33(H) 33(H)  Calcium 8.9 - 10.3 mg/dL 8.9 9.2 8.6(L)  Total Protein 6.5 - 8.1 g/dL - 7.8 7.9  Total Bilirubin 0.3 - 1.2 mg/dL - 0.6 0.7  Alkaline Phos 38 - 126 U/L - 53 61  AST 15 - 41 U/L - 15 16  ALT 0 - 44 U/L - 12 16    Imaging studies: No new pertinent imaging studies   Assessment/Plan: (ICD-10's: K57.32) 47 y.o.femalewith severe acute on chronic constipation with pan-colonic fecal impaction complicated by progression of first episodesigmoid colonic diverticulitiswithout appreciableperi-colonic abscess and bycomorbidities includinghistory of until recently chronic opiates abuse/dependence, obesity (BMI 34), COPD, and chronic ongoing tobacco abuse (smoking).    - Given acute change in pain following enema and bowel prep yesterday, will obtain CT Abdomen/pelvis to rule out perforation   - NPO, IVF  - Re-check CCB this morning   - Pain control PRN (minimize narcotics as much as possible) - however given acute pain this morning will give 0.5 dilaudid one time             - Monitor  abdominal examination and on-going bowel function   - Hold bowel prep for now  - possibility of surgery with partial colectomy and likely colostomy also discussed if doesn't improve/resolve with antibiotics - medical management of comorbidities as per primary medical team - DVT prophylaxis, ambulation encouraged    All of the above findings and recommendations were discussed with the patient, and the medical team, and all  of patient's questions were answered to her expressed satisfaction.  -- Edison Simon, PA-C [facility] 12/06/2017, 8:49 AM [phone] M-F: 7am - 4pm

## 2017-12-07 LAB — COMPREHENSIVE METABOLIC PANEL
ALBUMIN: 2.6 g/dL — AB (ref 3.5–5.0)
ALT: 38 U/L (ref 0–44)
AST: 52 U/L — ABNORMAL HIGH (ref 15–41)
Alkaline Phosphatase: 52 U/L (ref 38–126)
Anion gap: 8 (ref 5–15)
BUN: 6 mg/dL (ref 6–20)
CHLORIDE: 102 mmol/L (ref 98–111)
CO2: 34 mmol/L — AB (ref 22–32)
Calcium: 8.2 mg/dL — ABNORMAL LOW (ref 8.9–10.3)
Creatinine, Ser: 0.36 mg/dL — ABNORMAL LOW (ref 0.44–1.00)
GFR calc Af Amer: >60 mL/min (ref >60)
GFR calc non Af Amer: >60 mL/min (ref >60)
GLUCOSE: 90 mg/dL (ref 70–99)
POTASSIUM: 3.2 mmol/L — AB (ref 3.5–5.1)
SODIUM: 144 mmol/L (ref 135–145)
TOTAL PROTEIN: 5.7 g/dL — AB (ref 6.5–8.1)
Total Bilirubin: 0.3 mg/dL (ref 0.3–1.2)

## 2017-12-07 LAB — CBC
HEMATOCRIT: 36 % (ref 36.0–46.0)
Hemoglobin: 10.8 g/dL — ABNORMAL LOW (ref 12.0–15.0)
MCH: 30.5 pg (ref 26.0–34.0)
MCHC: 30 g/dL (ref 30.0–36.0)
MCV: 101.7 fL — ABNORMAL HIGH (ref 80.0–100.0)
NRBC: 0 % (ref 0.0–0.2)
PLATELETS: 310 10*3/uL (ref 150–400)
RBC: 3.54 MIL/uL — ABNORMAL LOW (ref 3.87–5.11)
RDW: 12.5 % (ref 11.5–15.5)
WBC: 4.4 10*3/uL (ref 4.0–10.5)

## 2017-12-07 LAB — MAGNESIUM: Magnesium: 1.9 mg/dL (ref 1.7–2.4)

## 2017-12-07 MED ORDER — HYDROMORPHONE HCL 1 MG/ML IJ SOLN
0.5000 mg | Freq: Once | INTRAMUSCULAR | Status: AC
  Administered 2017-12-07: 0.5 mg via INTRAVENOUS
  Filled 2017-12-07: qty 0.5

## 2017-12-07 MED ORDER — POTASSIUM CHLORIDE 10 MEQ/100ML IV SOLN
10.0000 meq | INTRAVENOUS | Status: AC
  Administered 2017-12-07 (×4): 10 meq via INTRAVENOUS
  Filled 2017-12-07 (×3): qty 100

## 2017-12-07 MED ORDER — POTASSIUM CHLORIDE 10 MEQ/100ML IV SOLN: 10.0000 meq | INTRAVENOUS | Status: DC

## 2017-12-07 NOTE — Progress Notes (Addendum)
SURGICAL PROGRESS NOTE (cpt: [phone])  Patient seen and examined as described below with surgical PA-C, Ardell Isaacs.  Assessment/Plan: (ICD-10's: K57.32) 47 y.o.femalewith first episode ofsigmoid colonic diverticulitiswithout appreciableperi-colonic abscess complicated by much improved severe acute on chronic constipation with pan-colonic fecal impaction and bycomorbidities includinghistory of until recently chronic opiates abuse/dependence, obesity (BMI 34), COPD, and chronic ongoing tobacco abuse (smoking).    - continue IV antibiotics for now - pain control prn, minimize narcotics -advance to soft diet as tolerated, decrease IVfluids - maintain hydration, low fiber diet x 6 weeks, followed by ongoing high fiber diet             - continue BID Colace stool softener and daily Miralax vs lactulose or similar x next few days - monitor abdominal exam and bowel function, anticipate discharge Friday or Saturday - medical management of comorbidities as per primary medical team - DVT prophylaxis, ambulation encouraged  I have personally reviewed the patient's chart, evaluated/examined the patient, proposed the recommended management, and discussed these recommendations with the patient and her family to their expressed satisfaction as well as with patient's RN.  Thank you for the opportunity to participate in this patient's care.  -- Marilynne Drivers Rosana Hoes, MD, [place]: [facility] - Partnering for exceptional care. Office: [phone]      SURGICAL PROGRESS NOTE (cpt [phone])  Hospital Day(s): 4.   Post op day(s):  Marland Kitchen   Interval History: Patient seen and examined, no acute events or new complaints overnight. Patient reports that she continues to have discomfort in her lower abdomen but this is improved since yesterday. She continues to endorse bowel movements but also  notes they intermittently have blood in them. No complaints of fever, chills, nausea, or emesis. Tolerating clear liquids but "not eating much." Mobilizing.  Review of Systems:  Constitutional: denies fever, chills  HEENT: denies cough or congestion  Respiratory: denies any shortness of breath  Cardiovascular: denies chest pain or palpitations  Gastrointestinal: + denies abdominal pain, denied N/V, and bowel function as per interval history Genitourinary: denies burning with urination or urinary frequency Musculoskeletal: denies pain, decreased motor or sensation Integumentary: denies any other rashes or skin discolorations Neurological: denies HA or vision/hearing changes   Vital signs in last 24 hours: [min-max] current  Temp:  [98.2 F (36.8 C)-98.9 F (37.2 C)] 98.3 F (36.8 C) (11/20 0504) Pulse Rate:  [57-73] 57 (11/20 0504) Resp:  [16-24] 16 (11/20 0504) BP: (116-142)/(65-86) 116/65 (11/20 0504) SpO2:  [90 %-94 %] 94 % (11/20 0504)     Height: 5\' 3"  (160 cm) Weight: 86.6 kg BMI (Calculated): 33.84   Intake/Output this shift:  No intake/output data recorded.   Intake/Output last 2 shifts:  @IOLAST2SHIFTS @   Physical Exam:  Constitutional: alert, cooperative, writhing around bed HENT: normocephalic without obvious abnormality  Eyes:EOM's grossly intact and symmetric  Respiratory: breathing non-labored at rest  Gastrointestinal: soft,tenderness to the LLQ and suprapubic region, and non-distended Musculoskeletal: UE and LE FROM, no edema or wounds, motor and sensation grossly intact, NT   Labs:  CBC Latest Ref Rng & Units 12/07/2017 12/06/2017 12/04/2017  WBC 4.0 - 10.5 K/uL 4.4 6.3 7.2  Hemoglobin 12.0 - 15.0 g/dL 10.8(L) 12.8 11.9(L)  Hematocrit 36.0 - 46.0 % 36.0 42.1 39.6  Platelets 150 - 400 K/uL 310 416(H) 390   CMP Latest Ref Rng & Units 12/07/2017 12/04/2017 12/03/2017  Glucose 70 - 99 mg/dL 90 117(H) 95  BUN 6 - 20 mg/dL 6  5(L) 6  Creatinine 0.44 - 1.00  mg/dL 0.36(L) 0.43(L) 0.39(L)  Sodium 135 - 145 mmol/L 144 144 145  Potassium 3.5 - 5.1 mmol/L 3.2(L) 3.6 3.4(L)  Chloride 98 - 111 mmol/L 102 98 98  CO2 22 - 32 mmol/L 34(H) 35(H) 33(H)  Calcium 8.9 - 10.3 mg/dL 8.2(L) 8.9 9.2  Total Protein 6.5 - 8.1 g/dL 5.7(L) - 7.8  Total Bilirubin 0.3 - 1.2 mg/dL 0.3 - 0.6  Alkaline Phos 38 - 126 U/L 52 - 53  AST 15 - 41 U/L 52(H) - 15  ALT 0 - 44 U/L 38 - 12   Imaging studies: No new pertinent imaging studies  Assessment/Plan: (ICD-10's: K57.32) 47 y.o.femalewith severeacute onchronic constipation with pan-colonic fecal impaction complicated byprogression of first episodesigmoid colonic diverticulitiswithout appreciableperi-colonic abscess and bycomorbidities includinghistory of until recently chronic opiates abuse/dependence, obesity (BMI 34), COPD, and chronic ongoing tobacco abuse (smoking).               - Clear liquids + Nutritional Supplementation             - Follow morning labs (BMP)             - Pain control PRN (minimize narcotics as much as possible) - however given acute pain this morning will give 0.5 dilaudid one time - Monitor abdominal examination and on-going bowel function             - Hold SMOG enemas for now as constipation appears improved significantly on CT.              - possibility of surgery with partial colectomy and likely colostomy also discussed if doesn't improve/resolve with antibiotics - medical management of comorbidities as per primary medical team - DVT prophylaxis, ambulation encouraged   All of the above findings and recommendations were discussed with the patient, and the medical team, and all of patient's questions were answered to her expressed satisfaction.  -- Edison Simon, PA-C [facility] 12/07/2017, 10:51 AM [phone] M-F: 7am - 4pm

## 2017-12-07 NOTE — Consult Note (Signed)
PHARMACY CONSULT NOTE - FOLLOW UP  Pharmacy Consult for Electrolyte Monitoring and Replacement   Recent Labs: Potassium (mmol/L)  Date Value  12/07/2017 3.2 (L)   Magnesium (mg/dL)  Date Value  12/07/2017 1.9   Calcium (mg/dL)  Date Value  12/07/2017 8.2 (L)   Albumin (g/dL)  Date Value  12/07/2017 2.6 (L)   Corrected calcium  9.3 mg/dL  Assessment: 47 year old female admitted with diverticulitis. Initially she had constipation but was administered a rectal SMOG enema on 11/18 and has since had significant diarrhea  Goal of Therapy:  Magnesium 1.7 -2.4 mg/dL Potassium 3.5-5.1 mmol/L Corrected calcium > 7.5 mg/dL  Plan:  KCl 10 mEq IV q2h x4 Pharmacy will follow-up morning labs and replace as needed  Dallie Piles ,PharmD Clinical Pharmacist 12/07/2017 10:19 AM

## 2017-12-07 NOTE — Progress Notes (Signed)
Pt was missing from room when it was time for her scheduled antibiotic. Pt was found outside smoking with her IV pole and potassium running, 3 family members were present. Pt was educated on this and brought back up to the room.

## 2017-12-07 NOTE — Progress Notes (Signed)
[facility] NAME: Misty Green    MR#:  [id number]  DATE OF BIRTH:  1970-08-02  SUBJECTIVE:  Reporting abdominal pain 9.5 out of 10, one episode of vomiting but tolerating clear liquids  REVIEW OF SYSTEMS:    Review of Systems  Constitutional: Negative for fever, chills weight loss HENT: Negative for ear pain, nosebleeds, congestion, facial swelling, rhinorrhea, neck pain, neck stiffness and ear discharge.   Respiratory: Negative for cough, shortness of breath, wheezing  Cardiovascular: Negative for chest pain, palpitations and leg swelling.  Gastrointestinal: Negative for heartburn, +abdominal pain NO vomiting, diarrhea ++ consitpation Genitourinary: Negative for dysuria, urgency, frequency, hematuria Musculoskeletal: Negative for back pain or joint pain Neurological: Negative for dizziness, seizures, syncope, focal weakness,  numbness and headaches.  Hematological: Does not bruise/bleed easily.  Psychiatric/Behavioral: Negative for hallucinations, confusion, dysphoric mood    Tolerating Diet: yes      DRUG ALLERGIES:   Allergies  Allergen Reactions  . Other Nausea And Vomiting    Anti-depressent that starts with t    VITALS:  Blood pressure 106/63, pulse 67, temperature 98.4 F (36.9 C), resp. rate 16, height 5\' 3"  (1.6 m), weight 86.6 kg, SpO2 95 %.  PHYSICAL EXAMINATION:  Constitutional: Appears well-developed and well-nourished. No distress. HENT: Normocephalic. Marland Kitchen Oropharynx is clear and moist.  Eyes: Conjunctivae and EOM are normal. PERRLA, no scleral icterus.  Neck: Normal ROM. Neck supple. No JVD. No tracheal deviation. CVS: RRR, S1/S2 +, no murmurs, no gallops, no carotid bruit.  Pulmonary: Effort and breath sounds normal, no stridor, rhonchi, wheezes, rales.  Abdominal: Soft. BS +,  no distension, patient has lower abdominal tenderness, no rebound  guarding.  Musculoskeletal: Normal range of motion. No  edema and no tenderness.  Neuro: Alert. CN 2-12 grossly intact. No focal deficits. Skin: Skin is warm and dry. No rash noted. Psychiatric: Normal mood and affect.      LABORATORY PANEL:   CBC Recent Labs  Lab 12/07/17 0341  WBC 4.4  HGB 10.8*  HCT 36.0  PLT 310   ------------------------------------------------------------------------------------------------------------------  Chemistries  Recent Labs  Lab 12/07/17 0341  NA 144  K 3.2*  CL 102  CO2 34*  GLUCOSE 90  BUN 6  CREATININE 0.36*  CALCIUM 8.2*  MG 1.9  AST 52*  ALT 38  ALKPHOS 52  BILITOT 0.3   ------------------------------------------------------------------------------------------------------------------  Cardiac Enzymes No results for input(s): TROPONINI in the last 168 hours. ------------------------------------------------------------------------------------------------------------------  RADIOLOGY:  Dg Chest 2 View  Result Date: 12/03/2017 CLINICAL DATA:  Hypoxia EXAM: CHEST - 2 VIEW COMPARISON:  11/28/2017 FINDINGS: Cardiac shadow is stable. Multiple calcified granulomas are again identified. No focal infiltrate is seen. Mild vascular congestion is noted with minimal interstitial edema. No bony abnormality is seen. Hyperinflation of the lungs is noted. IMPRESSION: COPD and changes of prior granulomatous disease. Mild vascular congestion with interstitial edema. Electronically Signed   By: Inez Catalina M.D.   On: 12/03/2017 16:05   Dg Abd 1 View  Result Date: 12/04/2017 CLINICAL DATA:  Lower abdominal pain EXAM: ABDOMEN - 1 VIEW COMPARISON:  CT 12/03/2017 FINDINGS: Prior cholecystectomy. Moderate stool burden in the colon. No obstruction or free air. No organomegaly. No acute bony abnormality. IMPRESSION: Moderate stool burden.  Prior cholecystectomy.  No acute findings. Electronically Signed   By: Rolm Baptise M.D.   On: 12/04/2017 17:12   Ct Abdomen Pelvis W Contrast  Result Date:  12/03/2017 CLINICAL DATA:  Constant  lower abdominal pain, loose stools and nausea. Recently diagnosed with diverticulitis. Smoker. EXAM: CT ABDOMEN AND PELVIS WITH CONTRAST TECHNIQUE: Multidetector CT imaging of the abdomen and pelvis was performed using the standard protocol following bolus administration of intravenous contrast. CONTRAST:  169mL OMNIPAQUE IOHEXOL 300 MG/ML  SOLN COMPARISON:  11/28/2017, 06/04/2010 and 05/13/2010. FINDINGS: Lower chest: 4.8 mm right lower lobe nodule on image number 2 series 4, not present on 06/04/2010. A tiny peripheral right lower lobe calcified granuloma is unchanged compared to the previous examinations as well as a tiny peripheral right lower lobe subpleural nodule, on images 8 and 5 series 4 respectively. Hepatobiliary: No focal liver abnormality is seen. Status post cholecystectomy. No biliary dilatation. Pancreas: Unremarkable. No pancreatic ductal dilatation or surrounding inflammatory changes. Spleen: Normal in size without focal abnormality. Adrenals/Urinary Tract: Tiny upper pole left renal calculus. Normal appearing right kidney, ureters and urinary bladder. Normal appearing adrenal glands. Stomach/Bowel: Small hiatal hernia. Normal appearing small bowel and appendix. Multiple colonic diverticula. Diffuse mid and distal sigmoid colon wall thickening and pericolonic soft tissue stranding with mild progression. No free peritoneal air seen. Small amount of free peritoneal fluid in the inferior pelvis with mild partially surrounding membrane enhancement adjacent to the inflammatory changes in the sigmoid colon. The remainder of the fluid does not have surrounding membrane enhancement . Vascular/Lymphatic: Mild atheromatous arterial calcifications without aneurysm or enlarged lymph nodes. Reproductive: Status post hysterectomy. No adnexal masses. Other: Midline surgical scar. No hernia seen Musculoskeletal: Lumbar and lower thoracic spine degenerative changes. Interbody  bone plugs at the L4-5 and L5-S1 levels. Bilateral L5 pars interarticularis defects with associated grade 1 anterolisthesis at the L5-S1 level, without significant change. IMPRESSION: 1. Mildly progressive changes of diverticulitis involving the mid and distal sigmoid colon without abscess. 2. Small amount of free peritoneal fluid in the inferior pelvis, increased with interval mild partially surrounding membrane enhancement adjacent to the area of diverticulitis in the sigmoid colon, without a discrete walled-off abscess at this time. 3. 4.8 mm right lower lobe lung nodule. Recommend noncontrast chest CT can be in 12 months since the patient is a smoker. This recommendation follows the consensus statement: Guidelines for Management of Incidental Pulmonary Nodules Detected on CT Images: From the Fleischner Society 2017; Radiology 2017; 284:228-243. 4. Tiny, nonobstructing upper pole left renal calculus. 5. Bilateral L5 spondylolysis with associated grade 1 spondylolisthesis at the L5-S1 level. Electronically Signed   By: Claudie Revering M.D.   On: 12/03/2017 15:42     ASSESSMENT AND PLAN:   47 year old female with a history of COPD, tobacco dependence and anxiety presented to the hospital due to worsening abdominal pain.   1.  Acute sigmoid diverticulitis: CT scan did not show evidence of abscess or perforation.  Patient failed outpatient treatment with Augmentin.  Repeat CT scan is consistent with sigmoid diverticulitis/colitis Continue IV Rocephin and Flagyl Pain management and follow-up with surgery. Advance diet as tolerated.  Clears to full liquid No indication for surgery at this time.   Clinically improving at a slower pace Check a.m. Labs Appreciate general surgery recommendation.  Follow-up with Dr. Rosana Hoes   2.  COPD without signs of exacerbation. Continue inhalers  3. Tobacco dependence: Patient is encouraged to quit smoking. Counseling was provided. 4.  Neuropathy: Continue  gabapentin  5.  Anxiety: Continue PRN Xanax and Lexapro  6.  Constipation: Continue current bowel regimen   7. 4.8 mm right lower lobe lung nodule. Recommend noncontrast chest CT can be in 12 months  since the patient is a smoker.  Management plans discussed with the patient and she is in agreement.  CODE STATUS: full  TOTAL TIME TAKING CARE OF THIS PATIENT: 35 minutes.     POSSIBLE D/C 1-2 days, DEPENDING ON CLINICAL CONDITION.   Nicholes Mango M.D on 12/07/2017 at 3:09 PM  Between 7am to 6pm - Pager - [phone] After 6pm go to [email or web address] - password [email or web address] [facility]  Office  [phone]  CC: Primary care physician; Sinda Du, MD  Note: This dictation was prepared with Dragon dictation along with smaller phrase technology. Any transcriptional errors that result from this process are unintentional.

## 2017-12-08 LAB — BASIC METABOLIC PANEL
Anion gap: 6 (ref 5–15)
BUN: 9 mg/dL (ref 6–20)
CALCIUM: 8.3 mg/dL — AB (ref 8.9–10.3)
CO2: 33 mmol/L — ABNORMAL HIGH (ref 22–32)
CREATININE: 0.42 mg/dL — AB (ref 0.44–1.00)
Chloride: 105 mmol/L (ref 98–111)
GFR calc Af Amer: >60 mL/min (ref >60)
GLUCOSE: 94 mg/dL (ref 70–99)
Potassium: 3.2 mmol/L — ABNORMAL LOW (ref 3.5–5.1)
Sodium: 144 mmol/L (ref 135–145)

## 2017-12-08 LAB — MAGNESIUM: Magnesium: 1.8 mg/dL (ref 1.7–2.4)

## 2017-12-08 LAB — VITAMIN B12: Vitamin B-12: 935 pg/mL — ABNORMAL HIGH (ref 180–914)

## 2017-12-08 MED ORDER — ONDANSETRON HCL 4 MG/2ML IJ SOLN
4.0000 mg | Freq: Once | INTRAMUSCULAR | Status: AC
  Administered 2017-12-08: 4 mg via INTRAVENOUS
  Filled 2017-12-08: qty 2

## 2017-12-08 MED ORDER — POTASSIUM CHLORIDE 10 MEQ/100ML IV SOLN
10.0000 meq | INTRAVENOUS | Status: AC
  Administered 2017-12-08 (×4): 10 meq via INTRAVENOUS
  Filled 2017-12-08 (×4): qty 100

## 2017-12-08 MED ORDER — POTASSIUM CHLORIDE CRYS ER 20 MEQ PO TBCR
40.0000 meq | EXTENDED_RELEASE_TABLET | Freq: Two times a day (BID) | ORAL | Status: DC
  Administered 2017-12-08 – 2017-12-13 (×11): 40 meq via ORAL
  Filled 2017-12-08 (×11): qty 2

## 2017-12-08 NOTE — Progress Notes (Addendum)
SURGICAL PROGRESS NOTE (cpt: [phone])  Patient seen and examined as described below with surgical PA-C, Ardell Isaacs.  Assessment/Plan: (ICD-10's: K57.32) 47 y.o.femalewith improving first episode ofsigmoid colonic diverticulitiswithout appreciableperi-colonic abscess complicated bymuch improved severeacute onchronic constipation with pan-colonic fecal impaction and bycomorbidities includinghistory of until recently chronic opiates abuse/dependence, obesity (BMI 34), COPD, and chronic ongoing tobacco abuse (smoking).               - continue IV antibiotics for now - pain control prn, minimize narcotics -advance to soft diet as tolerated, decrease IVfluids - maintain hydration, low fiber diet x 6 weeks, followed by ongoing high fiber diet - continue BID Colace stool softener (indefinitely) and daily Miralax vs lactulose or similar x next few days - monitor abdominal exam and bowel function, anticipate discharge Saturday with Augmentin x10 days, no narcotics  - medical management of comorbidities as per primary medical team  - outpatient surgical follow-up, will continue to follow while inpatient - DVT prophylaxis, ambulation encouraged  I have personally reviewed the patient's chart, evaluated/examined the patient, proposed the recommended management, and discussed these recommendations with the patient and her family to their expressed satisfaction as well as with patient's RN.  Thank you for the opportunity to participate in this patient's care.  -- Marilynne Drivers Rosana Hoes, MD, [place]: [facility] - Partnering for exceptional care. Office: [phone]      SURGICAL PROGRESS NOTE (cpt [phone])  Hospital Day(s): 5.   Post op day(s):  Marland Kitchen   Interval History: Patient seen and examined, no acute events or new complaints  overnight. Patient reports she continues to have some LLQ soreness but this continues to improve daily, denies fever, chills, nausea, or emesis. Continues to have BM daily. She has been tolerating full liquid diet without difficulty. Mobilizing well.   Review of Systems:  Constitutional: denies fever, chills  HEENT: denies cough or congestion  Respiratory: denies any shortness of breath  Cardiovascular: denies chest pain or palpitations  Gastrointestinal: + abdominal pain, denied N/V, or diarrhea/and bowel function as per interval history Genitourinary: denies burning with urination or urinary frequency Musculoskeletal: denies pain, decreased motor or sensation Integumentary: denies any other rashes or skin discolorations Neurological: denies HA or vision/hearing changes   Vital signs in last 24 hours: [min-max] current  Temp:  [98.3 F (36.8 C)-98.4 F (36.9 C)] 98.3 F (36.8 C) (11/21 0445) Pulse Rate:  [67-80] 75 (11/21 0710) Resp:  [18] 18 (11/21 0445) BP: (88-130)/(51-74) 115/66 (11/21 0710) SpO2:  [89 %-95 %] 94 % (11/21 0445)     Height: 5\' 3"  (160 cm) Weight: 86.6 kg BMI (Calculated): 33.84   Intake/Output this shift:  Total I/O In: 100 [IV Piggyback:100] Out: 200 [Urine:200]   Intake/Output last 2 shifts:  @IOLAST2SHIFTS @   Physical Exam:  Constitutional: alert, cooperative, writhing around bed HENT: normocephalic without obvious abnormality  Eyes:EOM's grossly intact and symmetric  Respiratory: breathing non-labored at rest  Gastrointestinal: soft,improved point tenderness in the LLQ, and non-distended Musculoskeletal: UE and LE FROM, no edema or wounds, motor and sensation grossly intact, NT    Labs:  CBC Latest Ref Rng & Units 12/07/2017 12/06/2017 12/04/2017  WBC 4.0 - 10.5 K/uL 4.4 6.3 7.2  Hemoglobin 12.0 - 15.0 g/dL 10.8(L) 12.8 11.9(L)  Hematocrit 36.0 - 46.0 % 36.0 42.1 39.6  Platelets 150 - 400 K/uL 310 416(H) 390   CMP Latest Ref Rng & Units  12/08/2017 12/07/2017 12/04/2017  Glucose 70 - 99 mg/dL 94  90 117(H)  BUN 6 - 20 mg/dL 9 6 5(L)  Creatinine 0.44 - 1.00 mg/dL 0.42(L) 0.36(L) 0.43(L)  Sodium 135 - 145 mmol/L 144 144 144  Potassium 3.5 - 5.1 mmol/L 3.2(L) 3.2(L) 3.6  Chloride 98 - 111 mmol/L 105 102 98  CO2 22 - 32 mmol/L 33(H) 34(H) 35(H)  Calcium 8.9 - 10.3 mg/dL 8.3(L) 8.2(L) 8.9  Total Protein 6.5 - 8.1 g/dL - 5.7(L) -  Total Bilirubin 0.3 - 1.2 mg/dL - 0.3 -  Alkaline Phos 38 - 126 U/L - 52 -  AST 15 - 41 U/L - 52(H) -  ALT 0 - 44 U/L - 38 -     Imaging studies: No new pertinent imaging studies   Assessment/Plan: (ICD-10's: K57.32) 47 y.o.femalewith improving severeacute onchronic constipation with pan-colonic fecal impaction complicated byprogression of first episodesigmoid colonic diverticulitiswithout appreciableperi-colonic abscess and mild hypokalemia which is complicated bycomorbidities includinghistory of until recently chronic opiates abuse/dependence, obesity (BMI 34), COPD, and chronic ongoing tobacco abuse (smoking).  - Soft diet + Nutritional Supplementation - Continue ABx (Ceftriaxone + Metronidazole) - Pain control PRN (minimize narcotics as much as possible) - Monitor abdominal examination and on-going bowel function - Discontinue SMOG enemas, continue current bowel regimen otherwise. .  - possibility of surgery with partial colectomy and likely colostomy also discussed if doesn't improve/resolve with antibiotics - medical management of comorbidities as per primary medical team - DVT prophylaxis, ambulation encouraged   All of the above findings and recommendations were discussed with the patient, and the medical team  -- Edison Simon, PA-C [facility] 12/08/2017, 9:33 AM [phone] M-F: 7am - 4pm

## 2017-12-08 NOTE — Progress Notes (Signed)
[facility] [place] [facility] NAME: Misty Green    MR#:  [id number]  DATE OF BIRTH:  02/16/1970  SUBJECTIVE:  Reporting abdominal pain 9.5 out of 10, one episode of vomiting but tolerating clear liquids Also tolerated some soups in full liquid diet last night. Going to try some soft food today.  REVIEW OF SYSTEMS:    Review of Systems  Constitutional: Negative for fever, chills weight loss HENT: Negative for ear pain, nosebleeds, congestion, facial swelling, rhinorrhea, neck pain, neck stiffness and ear discharge.   Respiratory: Negative for cough, shortness of breath, wheezing  Cardiovascular: Negative for chest pain, palpitations and leg swelling.  Gastrointestinal: Negative for heartburn, +abdominal pain NO vomiting, diarrhea ++ consitpation Genitourinary: Negative for dysuria, urgency, frequency, hematuria Musculoskeletal: Negative for back pain or joint pain Neurological: Negative for dizziness, seizures, syncope, focal weakness,  numbness and headaches.  Hematological: Does not bruise/bleed easily.  Psychiatric/Behavioral: Negative for hallucinations, confusion, dysphoric mood  Tolerating Diet: yes  DRUG ALLERGIES:   Allergies  Allergen Reactions  . Other Nausea And Vomiting    Anti-depressent that starts with t    VITALS:  Blood pressure 115/66, pulse 75, temperature 98.3 F (36.8 C), temperature source Oral, resp. rate 18, height 5\' 3"  (1.6 m), weight 86.6 kg, SpO2 94 %.  PHYSICAL EXAMINATION:   Constitutional: Appears well-developed and well-nourished. No distress. HEENT: Normocephalic. Marland Kitchen Oropharynx is clear and moist.  Eyes: Conjunctivae and EOM are normal. PERRLA, no scleral icterus.  Neck: Normal ROM. Neck supple. No JVD. No tracheal deviation. CVS: RRR, S1/S2 +, no murmurs, no gallops, no carotid bruit.  Pulmonary: Effort and breath sounds normal, no stridor, rhonchi, wheezes, rales.  Abdominal: Soft. BS +, no  distension, patient has lower abdominal tenderness, no rebound  guarding.  Musculoskeletal: Normal range of motion. No edema and no tenderness.  Neuro: Alert. CN 2-12 grossly intact. No focal deficits. Skin: Skin is warm and dry. No rash noted. Psychiatric: Normal mood and affect.   LABORATORY PANEL:   CBC Recent Labs  Lab 12/07/17 0341  WBC 4.4  HGB 10.8*  HCT 36.0  PLT 310   ------------------------------------------------------------------------------------------------------------------  Chemistries  Recent Labs  Lab 12/07/17 0341 12/08/17 0444  NA 144 144  K 3.2* 3.2*  CL 102 105  CO2 34* 33*  GLUCOSE 90 94  BUN 6 9  CREATININE 0.36* 0.42*  CALCIUM 8.2* 8.3*  MG 1.9 1.8  AST 52*  --   ALT 38  --   ALKPHOS 52  --   BILITOT 0.3  --    ------------------------------------------------------------------------------------------------------------------  Cardiac Enzymes No results for input(s): TROPONINI in the last 168 hours. ------------------------------------------------------------------------------------------------------------------  RADIOLOGY:  Dg Chest 2 View  Result Date: 12/03/2017 CLINICAL DATA:  Hypoxia EXAM: CHEST - 2 VIEW COMPARISON:  11/28/2017 FINDINGS: Cardiac shadow is stable. Multiple calcified granulomas are again identified. No focal infiltrate is seen. Mild vascular congestion is noted with minimal interstitial edema. No bony abnormality is seen. Hyperinflation of the lungs is noted. IMPRESSION: COPD and changes of prior granulomatous disease. Mild vascular congestion with interstitial edema. Electronically Signed   By: Inez Catalina M.D.   On: 12/03/2017 16:05   Dg Abd 1 View  Result Date: 12/04/2017 CLINICAL DATA:  Lower abdominal pain EXAM: ABDOMEN - 1 VIEW COMPARISON:  CT 12/03/2017 FINDINGS: Prior cholecystectomy. Moderate stool burden in the colon. No obstruction or free air. No organomegaly. No acute bony abnormality. IMPRESSION: Moderate  stool burden.  Prior  cholecystectomy.  No acute findings. Electronically Signed   By: Rolm Baptise M.D.   On: 12/04/2017 17:12   Ct Abdomen Pelvis W Contrast  Result Date: 12/03/2017 CLINICAL DATA:  Constant lower abdominal pain, loose stools and nausea. Recently diagnosed with diverticulitis. Smoker. EXAM: CT ABDOMEN AND PELVIS WITH CONTRAST TECHNIQUE: Multidetector CT imaging of the abdomen and pelvis was performed using the standard protocol following bolus administration of intravenous contrast. CONTRAST:  142mL OMNIPAQUE IOHEXOL 300 MG/ML  SOLN COMPARISON:  11/28/2017, 06/04/2010 and 05/13/2010. FINDINGS: Lower chest: 4.8 mm right lower lobe nodule on image number 2 series 4, not present on 06/04/2010. A tiny peripheral right lower lobe calcified granuloma is unchanged compared to the previous examinations as well as a tiny peripheral right lower lobe subpleural nodule, on images 8 and 5 series 4 respectively. Hepatobiliary: No focal liver abnormality is seen. Status post cholecystectomy. No biliary dilatation. Pancreas: Unremarkable. No pancreatic ductal dilatation or surrounding inflammatory changes. Spleen: Normal in size without focal abnormality. Adrenals/Urinary Tract: Tiny upper pole left renal calculus. Normal appearing right kidney, ureters and urinary bladder. Normal appearing adrenal glands. Stomach/Bowel: Small hiatal hernia. Normal appearing small bowel and appendix. Multiple colonic diverticula. Diffuse mid and distal sigmoid colon wall thickening and pericolonic soft tissue stranding with mild progression. No free peritoneal air seen. Small amount of free peritoneal fluid in the inferior pelvis with mild partially surrounding membrane enhancement adjacent to the inflammatory changes in the sigmoid colon. The remainder of the fluid does not have surrounding membrane enhancement . Vascular/Lymphatic: Mild atheromatous arterial calcifications without aneurysm or enlarged lymph nodes.  Reproductive: Status post hysterectomy. No adnexal masses. Other: Midline surgical scar. No hernia seen Musculoskeletal: Lumbar and lower thoracic spine degenerative changes. Interbody bone plugs at the L4-5 and L5-S1 levels. Bilateral L5 pars interarticularis defects with associated grade 1 anterolisthesis at the L5-S1 level, without significant change. IMPRESSION: 1. Mildly progressive changes of diverticulitis involving the mid and distal sigmoid colon without abscess. 2. Small amount of free peritoneal fluid in the inferior pelvis, increased with interval mild partially surrounding membrane enhancement adjacent to the area of diverticulitis in the sigmoid colon, without a discrete walled-off abscess at this time. 3. 4.8 mm right lower lobe lung nodule. Recommend noncontrast chest CT can be in 12 months since the patient is a smoker. This recommendation follows the consensus statement: Guidelines for Management of Incidental Pulmonary Nodules Detected on CT Images: From the Fleischner Society 2017; Radiology 2017; 284:228-243. 4. Tiny, nonobstructing upper pole left renal calculus. 5. Bilateral L5 spondylolysis with associated grade 1 spondylolisthesis at the L5-S1 level. Electronically Signed   By: Claudie Revering M.D.   On: 12/03/2017 15:42     ASSESSMENT AND PLAN:   47 year old female with a history of COPD, tobacco dependence and anxiety presented to the hospital due to worsening abdominal pain.   1.  Acute sigmoid diverticulitis: CT scan did not show evidence of abscess or perforation.  Patient failed outpatient treatment with Augmentin.  Repeat CT scan is consistent with sigmoid diverticulitis/colitis Continue IV Rocephin and Flagyl Pain management and follow-up with surgery. Advance diet as tolerated.  Clears to full liquid-going to try soft diet today. No indication for surgery at this time.   Clinically improving at a slower pace Appreciate general surgery recommendation.  Follow-up with Dr.  Rosana Hoes  2.  COPD without signs of exacerbation. Continue inhalers  3. Tobacco dependence: Patient is encouraged to quit smoking. Counseling was provided. 4.  Neuropathy: Continue gabapentin  5.  Anxiety: Continue PRN Xanax and Lexapro  6.  Constipation: Continue current bowel regimen   7. 4.8 mm right lower lobe lung nodule. Recommend noncontrast chest CT can be in 12 months since the patient is a smoker.  Management plans discussed with the patient and she is in agreement.  CODE STATUS: full  TOTAL TIME TAKING CARE OF THIS PATIENT: 35 minutes.    POSSIBLE D/C 1-2 days, DEPENDING ON CLINICAL CONDITION.   Vaughan Basta M.D on 12/08/2017 at 2:03 PM  Between 7am to 6pm - Pager - [phone]  After 6pm go to [email or web address] - password [email or web address] [facility]  Office  [phone]  CC: Primary care physician; Sinda Du, MD  Note: This dictation was prepared with Dragon dictation along with smaller phrase technology. Any transcriptional errors that result from this process are unintentional.

## 2017-12-08 NOTE — Consult Note (Signed)
PHARMACY CONSULT NOTE - FOLLOW UP  Pharmacy Consult for Electrolyte Monitoring and Replacement   Recent Labs: Potassium (mmol/L)  Date Value  12/08/2017 3.2 (L)   Magnesium (mg/dL)  Date Value  12/07/2017 1.9   Calcium (mg/dL)  Date Value  12/08/2017 8.3 (L)   Albumin (g/dL)  Date Value  12/07/2017 2.6 (L)   Corrected calcium: 9.4    Assessment: 47 year old female admitted with diverticulitis. Initially she had constipation but was administered a rectal SMOG enema on 11/18 and has since had significant diarrhea  Goal of Therapy:  Magnesium 1.7 -2.4 mg/dL Potassium 3.5-5.1 mmol/L Corrected calcium > 7.5 mg/dL  Plan:  KCl 10 mEq IV q2h x4 - last dose given at 2003 on 11/20.   Potasium was 3.2 this morning with no change. Ordered KCl 10 mEq IV q2h x4 doses + KCl 40 mEq PO BID. Other electrolytes WNL.     Pharmacy will follow-up morning labs and replace as needed  Ocie Doyne ,PharmD candidate  12/08/2017 9:45 AM

## 2017-12-08 NOTE — Progress Notes (Signed)
Pt very upset and emotional about pain medication regimen last night, since pt did not receive any pain medication since 2136 12/07/17 after she had asked throughout the night. Camera operator, Surveyor, quantity, and Therapist, sports was notified of pt compliant. Unit director at pt bedside at this time.  Misty Green [employer]

## 2017-12-08 NOTE — Consult Note (Deleted)
PHARMACY CONSULT NOTE - FOLLOW UP  Pharmacy Consult for Electrolyte Monitoring and Replacement   Recent Labs: Potassium (mmol/L)  Date Value  12/08/2017 3.2 (L)   Magnesium (mg/dL)  Date Value  12/08/2017 1.8   Calcium (mg/dL)  Date Value  12/08/2017 8.3 (L)   Albumin (g/dL)  Date Value  12/07/2017 2.6 (L)   Corrected calcium: 9.4    Assessment: 47 year old female admitted with diverticulitis. Initially she had constipation but was administered a rectal SMOG enema on 11/18 and has since had significant diarrhea  Goal of Therapy:  Magnesium 1.7 -2.4 mg/dL Potassium 3.5-5.1 mmol/L Corrected calcium > 7.5 mg/dL  Plan:  KCl 10 mEq IV q2h x4 - last dose given at 2003 on 11/20.   Potasium was 3.2 this morning with no change. Ordered KCl 10 mEq IV q2h x4 doses + KCl 40 mEq PO BID. Other electrolytes WNL.     Pharmacy will follow-up morning labs and replace as needed  Misty Green ,PharmD candidate  12/08/2017 2:01 PM

## 2017-12-09 LAB — BASIC METABOLIC PANEL
ANION GAP: 4 — AB (ref 5–15)
BUN: 7 mg/dL (ref 6–20)
CALCIUM: 8.3 mg/dL — AB (ref 8.9–10.3)
CO2: 33 mmol/L — ABNORMAL HIGH (ref 22–32)
Chloride: 107 mmol/L (ref 98–111)
Creatinine, Ser: 0.42 mg/dL — ABNORMAL LOW (ref 0.44–1.00)
Glucose, Bld: 93 mg/dL (ref 70–99)
Potassium: 4.6 mmol/L (ref 3.5–5.1)
Sodium: 144 mmol/L (ref 135–145)

## 2017-12-09 MED ORDER — POLYETHYLENE GLYCOL 3350 17 G PO PACK: 17.0000 g | PACK | Freq: Every day | ORAL | 0 refills | Status: AC

## 2017-12-09 MED ORDER — DOCUSATE SODIUM 100 MG PO CAPS: 100.0000 mg | ORAL_CAPSULE | Freq: Two times a day (BID) | ORAL | 2 refills | Status: DC

## 2017-12-09 MED ORDER — AMOXICILLIN-POT CLAVULANATE 875-125 MG PO TABS: 1.0000 | ORAL_TABLET | Freq: Two times a day (BID) | ORAL | 0 refills | Status: DC

## 2017-12-09 MED ORDER — WITCH HAZEL-GLYCERIN EX PADS
MEDICATED_PAD | CUTANEOUS | Status: DC | PRN
  Filled 2017-12-09: qty 100

## 2017-12-09 MED ORDER — POLYETHYLENE GLYCOL 3350 17 G PO PACK
17.0000 g | PACK | Freq: Two times a day (BID) | ORAL | Status: DC
  Administered 2017-12-10: 17 g via ORAL
  Filled 2017-12-09: qty 1

## 2017-12-09 NOTE — Progress Notes (Addendum)
SURGICAL PROGRESS NOTE(cpt: [phone])  Patient seen and examined as described below with surgical PA-C, Ardell Isaacs.  Assessment/Plan: (ICD-10's: K57.32) 47 y.o.femalewith overall improved first episodeofsigmoid colonic diverticulitiswithout appreciableperi-colonic abscess complicated bymuch improvedsevereacute onchronic constipation with pan-colonic fecal impaction and bycomorbidities includinghistory of until recently chronic opiates abuse/dependence, obesity (BMI 34), COPD, and chronic ongoing tobacco abuse (smoking).   - continue antibiotics - pain control prn, minimize narcotics -soft diet as tolerated, monitor abdominal exam and bowel function - maintain hydration, low fiber diet x 6 weeks, followed by subsequent high fiber diet -continue BID Colace stool softener (indefinitely) and daily Miralax, lactulose, or similar x 7 more days - anticipate discharge tomorrow with Augmentin x10 days and no narcotics  - medical management of comorbidities as per primary medical team             - outpatient surgical follow-up, will continue to follow while inpatient - DVT prophylaxis, ambulation encouraged  I have personally reviewed the patient's chart, evaluated/examined the patient, proposed the recommended management, and discussed these recommendations with the patient and her family to their expressed satisfaction as well as with patient's RN.  Thank you for the opportunity to participate in this patient's care.  -- Marilynne Drivers Rosana Hoes, MD, [place]: [facility] - Partnering for exceptional care. Office: [phone]      SURGICAL PROGRESS NOTE (cpt [phone])  Hospital Day(s): 6.   Post op day(s):  Marland Kitchen   Interval History: Patient seen and examined, no acute events or new complaints overnight. Patient  reports persistent abdominal pain that may be "slightly" better but she denied any fever chills, nausea, or emesis. She has been tolerating a diet but notes she only ate her tomato soup. Mobilizing. Having multiple BMs.  She does note that she feels worse today because of a new cough, congestion, and headache which onset last night and into this morning. She rports that she thinks she's "getting a cold."  Review of Systems:  Constitutional: denies fever, chills  HEENT: denies cough or congestion  Respiratory: denies any shortness of breath  Cardiovascular: denies chest pain or palpitations  Gastrointestinal: + abdominal pain, denied nausea or vomiting, or diarrhea/and bowel function as per interval history Genitourinary: denies burning with urination or urinary frequency Musculoskeletal: denies pain, decreased motor or sensation Integumentary: denies any other rashes or skin discolorations Neurological: denies HA or vision/hearing changes   Vital signs in last 24 hours: [min-max] current  Temp:  [97.6 F (36.4 C)-98.3 F (36.8 C)] 98.3 F (36.8 C) (11/22 0938) Pulse Rate:  [71-83] 71 (11/22 0938) Resp:  [16-18] 17 (11/22 0938) BP: (105-143)/(64-92) 127/64 (11/22 0938) SpO2:  [88 %-91 %] 88 % (11/22 0938)     Height: 5\' 3"  (160 cm) Weight: 86.6 kg BMI (Calculated): 33.84   Intake/Output this shift:  No intake/output data recorded.   Intake/Output last 2 shifts:  @IOLAST2SHIFTS @   Physical Exam:  Constitutional: alert, cooperative, writhing around bed HENT: normocephalic without obvious abnormality  Eyes:EOM's grossly intact and symmetric  Respiratory: breathing non-labored at rest  Gastrointestinal: soft,improved point tenderness in the LLQ, and non-distended Musculoskeletal: UE and LE FROM, no edema or wounds, motor and sensation grossly intact, NT  Labs:  CBC Latest Ref Rng & Units 12/07/2017 12/06/2017 12/04/2017  WBC 4.0 - 10.5 K/uL 4.4 6.3 7.2  Hemoglobin 12.0 - 15.0  g/dL 10.8(L) 12.8 11.9(L)  Hematocrit 36.0 - 46.0 % 36.0 42.1 39.6  Platelets 150 - 400 K/uL 310 416(H) 390  CMP Latest Ref Rng & Units 12/09/2017 12/08/2017 12/07/2017  Glucose 70 - 99 mg/dL 93 94 90  BUN 6 - 20 mg/dL 7 9 6   Creatinine 0.44 - 1.00 mg/dL 0.42(L) 0.42(L) 0.36(L)  Sodium 135 - 145 mmol/L 144 144 144  Potassium 3.5 - 5.1 mmol/L 4.6 3.2(L) 3.2(L)  Chloride 98 - 111 mmol/L 107 105 102  CO2 22 - 32 mmol/L 33(H) 33(H) 34(H)  Calcium 8.9 - 10.3 mg/dL 8.3(L) 8.3(L) 8.2(L)  Total Protein 6.5 - 8.1 g/dL - - 5.7(L)  Total Bilirubin 0.3 - 1.2 mg/dL - - 0.3  Alkaline Phos 38 - 126 U/L - - 52  AST 15 - 41 U/L - - 52(H)  ALT 0 - 44 U/L - - 38    Imaging studies: No new pertinent imaging studies  Assessment/Plan: (ICD-10's: K57.32) 47 y.o.femalewith improving severeacute onchronic constipation with pan-colonic fecal impaction complicated byprogression of first episodesigmoid colonic diverticulitiswithout appreciableperi-colonic abscess and mild hypokalemia which is complicated bycomorbidities includinghistory of until recently chronic opiates abuse/dependence, obesity (BMI 34), COPD, and chronic ongoing tobacco abuse (smoking).  - Continue current bowel regimen -Continue Soft diet + nutritional supplements -Continue ABx (Ceftriaxone + Metronidazole), okay to transition to PO Augmentin - Pain control PRN (minimize narcotics as much as possible) - Monitor abdominal examination and on-going bowel function - medical management of comorbidities as per primary medical team - DVT prophylaxis, ambulation encouraged   - Discharge planning: Likely home tomorrow, follow up scheduled with Dr Rosana Hoes in 2 weeks. Provided prescription for Augmentin x10 days on patient's chart  All of the above findings and recommendations were discussed with the patient, and the medical team  -- Edison Simon,  PA-C [facility] 12/09/2017, 11:08 AM [phone] M-F: 7am - 4pm

## 2017-12-09 NOTE — Consult Note (Signed)
PHARMACY CONSULT NOTE - FOLLOW UP  Pharmacy Consult for Electrolyte Monitoring and Replacement   Recent Labs: Potassium (mmol/L)  Date Value  12/09/2017 4.6   Magnesium (mg/dL)  Date Value  12/08/2017 1.8   Calcium (mg/dL)  Date Value  12/09/2017 8.3 (L)   Albumin (g/dL)  Date Value  12/07/2017 2.6 (L)   Corrected calcium: 9.4    Assessment: 47 year old female admitted with diverticulitis. Initially she had constipation but was administered a rectal SMOG enema on 11/18 and has since had significant diarrhea  Goal of Therapy:  Magnesium 1.7 -2.4 mg/dL Potassium 3.5-5.1 mmol/L Corrected calcium > 7.5 mg/dL  Plan:  K 4.6     Scr 0.42  Patient on KCL PO 40 meq BID as ordered by MD.  No Diarrhea per MD note yesterday- attempting soft food.  Pharmacy will follow-up morning labs and replace as needed  Chinita Greenland PharmD Clinical Pharmacist 12/09/2017

## 2017-12-09 NOTE — Progress Notes (Signed)
[place] [facility] [place] NAME: Beda Dula    MR#:  [id number]  DATE OF BIRTH:  February 23, 1970  SUBJECTIVE:   Reporting abdominal pain 9.5 out of 10, one episode of vomiting but tolerating clear liquids Also tolerated some soups in full liquid diet last night. Tried soft food. had multiple liquidy bowel movement and requesting to stop lactulose.  REVIEW OF SYSTEMS:    Review of Systems  Constitutional: Negative for fever, chills weight loss HENT: Negative for ear pain, nosebleeds, congestion, facial swelling, rhinorrhea, neck pain, neck stiffness and ear discharge.   Respiratory: Negative for cough, shortness of breath, wheezing  Cardiovascular: Negative for chest pain, palpitations and leg swelling.  Gastrointestinal: Negative for heartburn, +abdominal pain NO vomiting, her constipation resolved but now has multiple liquidy bowel movements.   Genitourinary: Negative for dysuria, urgency, frequency, hematuria Musculoskeletal: Negative for back pain or joint pain Neurological: Negative for dizziness, seizures, syncope, focal weakness,  numbness and headaches.  Hematological: Does not bruise/bleed easily.  Psychiatric/Behavioral: Negative for hallucinations, confusion, dysphoric mood  Tolerating Diet: yes  DRUG ALLERGIES:   Allergies  Allergen Reactions  . Other Nausea And Vomiting    Anti-depressent that starts with t    VITALS:  Blood pressure 115/63, pulse 63, temperature 98.1 F (36.7 C), temperature source Oral, resp. rate 16, height 5\' 3"  (1.6 m), weight 86.6 kg, SpO2 93 %.  PHYSICAL EXAMINATION:   Constitutional: Appears well-developed and well-nourished. No distress. HEENT: Normocephalic. Marland Kitchen Oropharynx is clear and moist.  Eyes: Conjunctivae and EOM are normal. PERRLA, no scleral icterus.  Neck: Normal ROM. Neck supple. No JVD. No tracheal deviation. CVS: RRR, S1/S2 +, no murmurs, no gallops, no carotid bruit.  Pulmonary:  Effort and breath sounds normal, no stridor, rhonchi, wheezes, rales.  Abdominal: Soft. BS +, no distension, patient has lower abdominal tenderness, no rebound  guarding.  Musculoskeletal: Normal range of motion. No edema and no tenderness.  Neuro: Alert. CN 2-12 grossly intact. No focal deficits. Skin: Skin is warm and dry. No rash noted. Psychiatric: Normal mood and affect.   LABORATORY PANEL:   CBC Recent Labs  Lab 12/07/17 0341  WBC 4.4  HGB 10.8*  HCT 36.0  PLT 310   ------------------------------------------------------------------------------------------------------------------  Chemistries  Recent Labs  Lab 12/07/17 0341 12/08/17 0444 12/09/17 0523  NA 144 144 144  K 3.2* 3.2* 4.6  CL 102 105 107  CO2 34* 33* 33*  GLUCOSE 90 94 93  BUN 6 9 7   CREATININE 0.36* 0.42* 0.42*  CALCIUM 8.2* 8.3* 8.3*  MG 1.9 1.8  --   AST 52*  --   --   ALT 38  --   --   ALKPHOS 52  --   --   BILITOT 0.3  --   --    ------------------------------------------------------------------------------------------------------------------  Cardiac Enzymes No results for input(s): TROPONINI in the last 168 hours. ------------------------------------------------------------------------------------------------------------------  RADIOLOGY:  Dg Chest 2 View  Result Date: 12/03/2017 CLINICAL DATA:  Hypoxia EXAM: CHEST - 2 VIEW COMPARISON:  11/28/2017 FINDINGS: Cardiac shadow is stable. Multiple calcified granulomas are again identified. No focal infiltrate is seen. Mild vascular congestion is noted with minimal interstitial edema. No bony abnormality is seen. Hyperinflation of the lungs is noted. IMPRESSION: COPD and changes of prior granulomatous disease. Mild vascular congestion with interstitial edema. Electronically Signed   By: Inez Catalina M.D.   On: 12/03/2017 16:05   Dg Abd 1 View  Result Date: 12/04/2017 CLINICAL  DATA:  Lower abdominal pain EXAM: ABDOMEN - 1 VIEW COMPARISON:  CT  12/03/2017 FINDINGS: Prior cholecystectomy. Moderate stool burden in the colon. No obstruction or free air. No organomegaly. No acute bony abnormality. IMPRESSION: Moderate stool burden.  Prior cholecystectomy.  No acute findings. Electronically Signed   By: Rolm Baptise M.D.   On: 12/04/2017 17:12   Ct Abdomen Pelvis W Contrast  Result Date: 12/03/2017 CLINICAL DATA:  Constant lower abdominal pain, loose stools and nausea. Recently diagnosed with diverticulitis. Smoker. EXAM: CT ABDOMEN AND PELVIS WITH CONTRAST TECHNIQUE: Multidetector CT imaging of the abdomen and pelvis was performed using the standard protocol following bolus administration of intravenous contrast. CONTRAST:  117mL OMNIPAQUE IOHEXOL 300 MG/ML  SOLN COMPARISON:  11/28/2017, 06/04/2010 and 05/13/2010. FINDINGS: Lower chest: 4.8 mm right lower lobe nodule on image number 2 series 4, not present on 06/04/2010. A tiny peripheral right lower lobe calcified granuloma is unchanged compared to the previous examinations as well as a tiny peripheral right lower lobe subpleural nodule, on images 8 and 5 series 4 respectively. Hepatobiliary: No focal liver abnormality is seen. Status post cholecystectomy. No biliary dilatation. Pancreas: Unremarkable. No pancreatic ductal dilatation or surrounding inflammatory changes. Spleen: Normal in size without focal abnormality. Adrenals/Urinary Tract: Tiny upper pole left renal calculus. Normal appearing right kidney, ureters and urinary bladder. Normal appearing adrenal glands. Stomach/Bowel: Small hiatal hernia. Normal appearing small bowel and appendix. Multiple colonic diverticula. Diffuse mid and distal sigmoid colon wall thickening and pericolonic soft tissue stranding with mild progression. No free peritoneal air seen. Small amount of free peritoneal fluid in the inferior pelvis with mild partially surrounding membrane enhancement adjacent to the inflammatory changes in the sigmoid colon. The remainder of  the fluid does not have surrounding membrane enhancement . Vascular/Lymphatic: Mild atheromatous arterial calcifications without aneurysm or enlarged lymph nodes. Reproductive: Status post hysterectomy. No adnexal masses. Other: Midline surgical scar. No hernia seen Musculoskeletal: Lumbar and lower thoracic spine degenerative changes. Interbody bone plugs at the L4-5 and L5-S1 levels. Bilateral L5 pars interarticularis defects with associated grade 1 anterolisthesis at the L5-S1 level, without significant change. IMPRESSION: 1. Mildly progressive changes of diverticulitis involving the mid and distal sigmoid colon without abscess. 2. Small amount of free peritoneal fluid in the inferior pelvis, increased with interval mild partially surrounding membrane enhancement adjacent to the area of diverticulitis in the sigmoid colon, without a discrete walled-off abscess at this time. 3. 4.8 mm right lower lobe lung nodule. Recommend noncontrast chest CT can be in 12 months since the patient is a smoker. This recommendation follows the consensus statement: Guidelines for Management of Incidental Pulmonary Nodules Detected on CT Images: From the Fleischner Society 2017; Radiology 2017; 284:228-243. 4. Tiny, nonobstructing upper pole left renal calculus. 5. Bilateral L5 spondylolysis with associated grade 1 spondylolisthesis at the L5-S1 level. Electronically Signed   By: Claudie Revering M.D.   On: 12/03/2017 15:42     ASSESSMENT AND PLAN:   47 year old female with a history of COPD, tobacco dependence and anxiety presented to the hospital due to worsening abdominal pain.   1.  Acute sigmoid diverticulitis: CT scan did not show evidence of abscess or perforation.  Patient failed outpatient treatment with Augmentin.  Repeat CT scan is consistent with sigmoid diverticulitis/colitis Continue IV Rocephin and Flagyl Pain management and follow-up with surgery. Advance diet as tolerated.  Soft diet. No indication for  surgery at this time.   Clinically improving at a slower pace Appreciate general surgery recommendation.  Follow-up with Dr. Rosana Hoes She was given lactulose and Colace for chronic constipation now she is having multiple liquid bowel movements and requesting to stop stool softeners.  2.  COPD without signs of exacerbation. Continue inhalers  3. Tobacco dependence: Patient is encouraged to quit smoking. Counseling was provided. 4.  Neuropathy: Continue gabapentin  5.  Anxiety: Continue PRN Xanax and Lexapro  6.  Constipation: Continue current bowel regimen     Now having repeated diarrhea, stop lactulose and Colace.  7. 4.8 mm right lower lobe lung nodule. Recommend noncontrast chest CT can be in 12 months since the patient is a smoker.  Management plans discussed with the patient and she is in agreement.  CODE STATUS: full  TOTAL TIME TAKING CARE OF THIS PATIENT: 35 minutes.    POSSIBLE D/C 1-2 days, DEPENDING ON CLINICAL CONDITION.   Vaughan Basta M.D on 12/09/2017 at 4:21 PM  Between 7am to 6pm - Pager - [phone]  After 6pm go to [email or web address] - password [email or web address] [facility]  Office  [phone]  CC: Primary care physician; Sinda Du, MD  Note: This dictation was prepared with Dragon dictation along with smaller phrase technology. Any transcriptional errors that result from this process are unintentional.

## 2017-12-10 ENCOUNTER — Inpatient Hospital Stay: Payer: 59

## 2017-12-10 LAB — BASIC METABOLIC PANEL
ANION GAP: 5 (ref 5–15)
BUN: 6 mg/dL (ref 6–20)
CHLORIDE: 103 mmol/L (ref 98–111)
CO2: 34 mmol/L — ABNORMAL HIGH (ref 22–32)
Calcium: 9 mg/dL (ref 8.9–10.3)
Creatinine, Ser: 0.41 mg/dL — ABNORMAL LOW (ref 0.44–1.00)
GFR calc non Af Amer: >60 mL/min (ref >60)
Glucose, Bld: 102 mg/dL — ABNORMAL HIGH (ref 70–99)
POTASSIUM: 4.4 mmol/L (ref 3.5–5.1)
SODIUM: 142 mmol/L (ref 135–145)

## 2017-12-10 LAB — GASTROINTESTINAL PANEL BY PCR, STOOL (REPLACES STOOL CULTURE)

## 2017-12-10 MED ORDER — OXYCODONE HCL 5 MG PO TABS
5.0000 mg | ORAL_TABLET | ORAL | Status: DC | PRN
  Administered 2017-12-10 – 2017-12-13 (×10): 10 mg via ORAL
  Filled 2017-12-10 (×11): qty 2

## 2017-12-10 MED ORDER — IOPAMIDOL (ISOVUE-300) INJECTION 61%
15.0000 mL | INTRAVENOUS | Status: AC
  Administered 2017-12-10 (×2): 15 mL via ORAL

## 2017-12-10 MED ORDER — OXYCODONE HCL 5 MG PO TABS: 5.0000 mg | ORAL_TABLET | ORAL | Status: DC | PRN

## 2017-12-10 MED ORDER — MORPHINE SULFATE (PF) 2 MG/ML IV SOLN
2.0000 mg | INTRAVENOUS | Status: DC | PRN
  Administered 2017-12-10 – 2017-12-13 (×13): 2 mg via INTRAVENOUS
  Filled 2017-12-10 (×15): qty 1

## 2017-12-10 MED ORDER — IOPAMIDOL (ISOVUE-300) INJECTION 61%
100.0000 mL | Freq: Once | INTRAVENOUS | Status: AC | PRN
  Administered 2017-12-10: 100 mL via INTRAVENOUS

## 2017-12-10 NOTE — Progress Notes (Signed)
SURGICAL PROGRESS NOTE (cpt [phone])  Hospital Day(s): 7.   Post op day(s):  Marland Kitchen   Interval History: Patient seen and examined, no acute events or new complaints overnight. Patient reports persistent abdominal pain that may be "slightly" better but she denied any fever chills, nausea, or emesis. She has been tolerating a diet but notes she only ate her tomato soup. Mobilizing. Having multiple BMs.  She does note that she feels worse today because of a new cough, congestion, and headache which onset last night and into this morning. She rports that she thinks she's "getting a cold."  Review of Systems:  Constitutional: denies fever, chills  HEENT: denies cough or congestion  Respiratory: denies any shortness of breath  Cardiovascular: denies chest pain or palpitations  Gastrointestinal: + abdominal pain, denied nausea or vomiting, or diarrhea/and bowel function as per interval history Genitourinary: denies burning with urination or urinary frequency Musculoskeletal: denies pain, decreased motor or sensation Integumentary: denies any other rashes or skin discolorations Neurological: denies HA or vision/hearing changes   Vital signs in last 24 hours: [min-max] current  Temp:  [98.1 F (36.7 C)-98.7 F (37.1 C)] 98.3 F (36.8 C) (11/23 0506) Pulse Rate:  [63-85] 85 (11/23 0506) Resp:  [16-20] 20 (11/23 0506) BP: (115-125)/(63-76) 125/76 (11/23 0506) SpO2:  [93 %-95 %] 95 % (11/23 0506)     Height: 5\' 3"  (160 cm) Weight: 86.6 kg BMI (Calculated): 33.84   Intake/Output this shift:  No intake/output data recorded.   Intake/Output last 2 shifts:     Physical Exam:  Constitutional: alert, cooperative HENT: normocephalic without obvious abnormality  Eyes:EOM's grossly intact and symmetric  Respiratory: breathing non-labored at rest  Gastrointestinal: soft,improved point tenderness in the LLQ, and non-distended Musculoskeletal: UE and LE FROM,  no edema or wounds, motor and sensation grossly intact, NT  Labs:  CBC Latest Ref Rng & Units 12/07/2017 12/06/2017 12/04/2017  WBC 4.0 - 10.5 K/uL 4.4 6.3 7.2  Hemoglobin 12.0 - 15.0 g/dL 10.8(L) 12.8 11.9(L)  Hematocrit 36.0 - 46.0 % 36.0 42.1 39.6  Platelets 150 - 400 K/uL 310 416(H) 390   CMP Latest Ref Rng & Units 12/10/2017 12/09/2017 12/08/2017  Glucose 70 - 99 mg/dL 102(H) 93 94  BUN 6 - 20 mg/dL 6 7 9   Creatinine 0.44 - 1.00 mg/dL 0.41(L) 0.42(L) 0.42(L)  Sodium 135 - 145 mmol/L 142 144 144  Potassium 3.5 - 5.1 mmol/L 4.4 4.6 3.2(L)  Chloride 98 - 111 mmol/L 103 107 105  CO2 22 - 32 mmol/L 34(H) 33(H) 33(H)  Calcium 8.9 - 10.3 mg/dL 9.0 8.3(L) 8.3(L)  Total Protein 6.5 - 8.1 g/dL - - -  Total Bilirubin 0.3 - 1.2 mg/dL - - -  Alkaline Phos 38 - 126 U/L - - -  AST 15 - 41 U/L - - -  ALT 0 - 44 U/L - - -    Imaging studies: No new pertinent imaging studies  Assessment/Plan: (ICD-10's: K57.32) 47 y.o.femalewith improving severeacute onchronic constipation with pan-colonic fecal impaction complicated byprogression of first episodesigmoid colonic diverticulitiswithout appreciableperi-colonic abscess and mild hypokalemia which is complicated bycomorbidities includinghistory of until recently chronic opiates abuse/dependence, obesity (BMI 34), COPD, and chronic ongoing tobacco abuse (smoking).  - Continue current bowel regimen -Continue Soft diet + nutritional supplements -PO Augmentin x 10 days - Pain control PRN (minimize narcotics as much as possible) - Continue bowel regimen. - medical management of comorbidities as per primary medical team - DVT prophylaxis, ambulation  encouraged   - Discharge planning: OK to d/c home. follow up scheduled with Dr Rosana Hoes in 2 weeks. Provided prescription for Augmentin x10 days on patient's chart    All of the above findings and  recommendations were discussed with the patient, and the medical team  -- Fredirick Maudlin, PA-C [facility] 12/10/2017, 11:53 AM [phone] M-F: 7am - 4pm

## 2017-12-10 NOTE — Consult Note (Signed)
Misty Green , MD [address] [place], [place], [place], [address] [address], [place], [place], [place], [address] Phone: [phone]  Fax: [phone]  Consultation  Referring Provider:   Dr. Margaretmary Green primary Care Physician:  Misty Du, MD Primary Gastroenterologist: [facility]        Reason for Consultation:   Acute diverticulitis  Date of Admission:  12/03/2017 Date of Consultation:  12/10/2017         HPI:   Misty Green is a 47 y.o. female follows with [facility] last seen at their office on 11/08/2017.  She was seen by them for abdominal pain and change in bowel habits.  For the past 5 months has been having multiple loose stools a day 5-6 times a day.  Abdominal pain.  Concern for opioid addiction has been to inpatient rehab.  Plan was for for stool test and followed by colonoscopy and EGD.  12/03/2017: C. difficile and GI PCR were negative.  Hemoglobin 12.6 g.  Creatinine 0.43.  Admitted on 12/03/2017 for abdominal pain.  Diagnosed with acute diverticulitis of the sigmoid colon without evidence of abscess or perforation.  Failed outpatient oral antibiotics with Augmentin.  Commenced on IV meropenem.  I have been consulted to see the patient as he was concerned she is not improving.  CT abdomen on 12/06/2017 shows colonic diverticulosis from transverse to sigmoid colon.  Features of diverticulitis or colitis in the sigmoid colon.  Seen by surgery today adjusted a bowel regime and discharged on Augmentin for 10 days.  She says she has had persistent pain of her abdomen with diarrhea since admission , over the past 7 days she states that she feels a bit better but not completely, still has pain which she states has been attributed to constipation. Denies any fevers.   Past Medical History:  Diagnosis Date  . COPD (chronic obstructive pulmonary disease) ([place])   . Current smoker   . Opiate abuse, continuous ([place])     Past Surgical History:  Procedure  Laterality Date  . ABDOMINAL HYSTERECTOMY    . BACK SURGERY    . cesction    . INCISIONAL HERNIA REPAIR     lower midline laparotomy incision (hysterectomy), repaired with mesh  . LAPAROSCOPIC CHOLECYSTECTOMY  2012  . ORIF WRIST FRACTURE Right 08/25/2015   Procedure: OPEN REDUCTION INTERNAL FIXATION (ORIF) WRIST FRACTURE;  Surgeon: Corky Mull, MD;  Location: [facility] ORS;  Service: Orthopedics;  Laterality: Right;    Prior to Admission medications   Medication Sig Start Date End Date Taking? Authorizing Provider  albuterol (PROVENTIL HFA;VENTOLIN HFA) 108 (90 Base) MCG/ACT inhaler Inhale into the lungs every 6 (six) hours as needed for wheezing or shortness of breath.    [provider]  ALPRAZolam Duanne Moron) 1 MG tablet Take 1 mg by mouth 4 (four) times daily.    [provider]  amoxicillin-clavulanate (AUGMENTIN) 875-125 MG tablet Take 1 tablet by mouth 2 (two) times daily for 14 days. 11/28/17 12/12/17  Harvest Dark, MD  amoxicillin-clavulanate (AUGMENTIN) 875-125 MG tablet Take 1 tablet by mouth 2 (two) times daily for 10 days. 12/09/17 12/19/17  Tylene Fantasia, PA-C  Ascorbic Acid (VITAMIN C) 1000 MG tablet Take 1,000 mg by mouth daily.    [provider]  dicyclomine (BENTYL) 20 MG tablet Take 1 tablet (20 mg total) by mouth 3 (three) times daily as needed for spasms. 04/16/16 04/21/16  Lavonia Drafts, MD  diphenhydrAMINE (BENADRYL) 25 mg capsule  Take 1 capsule (25 mg total) by mouth every 6 (six) hours as needed for sleep. 04/16/16 04/20/16  Lavonia Drafts, MD  diphenhydramine-acetaminophen (TYLENOL PM) 25-500 MG TABS tablet Take 2 tablets by mouth at bedtime.    [provider]  docusate sodium (COLACE) 100 MG capsule Take 1 capsule (100 mg total) by mouth 2 (two) times daily. 12/09/17 12/09/18  Vickie Epley, MD  docusate sodium (COLACE) 100 MG capsule Take 1 capsule (100 mg total) by mouth 2 (two) times daily. 12/09/17   Vickie Epley, MD    Fluticasone-Salmeterol (ADVAIR) 250-50 MCG/DOSE AEPB Inhale 1 puff into the lungs 2 (two) times daily.    [provider]  furosemide (LASIX) 40 MG tablet Take 40 mg by mouth daily as needed for fluid.     [provider]  gabapentin (NEURONTIN) 300 MG capsule Take 300 mg by mouth 3 (three) times daily.    [provider]  Multiple Vitamins-Minerals (MULTIVITAMIN WITH MINERALS) tablet Take 1 tablet by mouth daily.    [provider]  ondansetron (ZOFRAN ODT) 4 MG disintegrating tablet Take 1 tablet (4 mg total) by mouth every 8 (eight) hours as needed for nausea or vomiting. 04/16/16   Lavonia Drafts, MD  Oxycodone HCl 20 MG TABS Take 20 mg by mouth 4 (four) times daily as needed (pain).    [provider]  polyethylene glycol (MIRALAX) packet Take 17 g by mouth daily for 7 days. 12/09/17 12/16/17  Vickie Epley, MD  predniSONE (DELTASONE) 10 MG tablet Take 4 tabs for 3 days, then 3 tabs for 3 days, then 2 tabs for 3 days, then 1 tab for 3 days, then 1/2 tab for 4 days. 03/24/16   Dessa Phi, DO  promethazine (PHENERGAN) 25 MG tablet Take 1 tablet (25 mg total) by mouth every 6 (six) hours as needed for nausea or vomiting. 08/21/15   Beers, Pierce Crane, PA-C  roflumilast (DALIRESP) 500 MCG TABS tablet Take 500 mcg by mouth daily.    [provider]  tiotropium (SPIRIVA) 18 MCG inhalation capsule Place 18 mcg into inhaler and inhale daily.    [provider]  zolpidem (AMBIEN) 10 MG tablet Take 10 mg by mouth at bedtime as needed for sleep.    [provider]    Family History  Problem Relation Age of Onset  . COPD Mother   . Hypertension Father   . Diabetes Father      Social History   Tobacco Use  . Smoking status: Current Every Day Smoker    Packs/day: 1.00    Years: 15.00    Pack years: 15.00    Types: Cigarettes  . Smokeless tobacco: Never Used  Substance Use Topics  . Alcohol use: No  . Drug use: Yes     Types: Marijuana    Comment: Yesterday    Allergies as of 12/03/2017 - Review Complete 12/03/2017  Allergen Reaction Noted  . Other Nausea And Vomiting 03/21/2016    Review of Systems:    All systems reviewed and negative except where noted in HPI.   Physical Exam:  Vital signs in last 24 hours: Temp:  [98.3 F (36.8 C)-98.7 F (37.1 C)] 98.5 F (36.9 C) (11/23 1248) Pulse Rate:  [73-87] 87 (11/23 1248) Resp:  [20] 20 (11/23 1248) BP: (120-127)/(61-76) 127/61 (11/23 1248) SpO2:  [86 %-95 %] 94 % (11/23 1248) Last BM Date: 12/09/17 General:   Pleasant, cooperative in NAD Head:  Normocephalic and  atraumatic. Eyes:   No icterus.   Conjunctiva pink. PERRLA. Ears:  Normal auditory acuity. Neck:  Supple; no masses or thyroidomegaly Lungs: Respirations even and unlabored. Lungs clear to auscultation bilaterally.   No wheezes, crackles, or rhonchi.  Heart:  Regular rate and rhythm;  Without murmur, clicks, rubs or gallops Abdomen:  Soft, nondistended, mild RUQ tenderness. Normal bowel sounds. No appreciable masses or hepatomegaly.  No rebound or guarding.  Neurologic:  Alert and oriented x3;  grossly normal neurologically. Skin:  Intact without significant lesions or rashes. Cervical Nodes:  No significant cervical adenopathy. Psych:  Alert and cooperative. Normal affect.  LAB RESULTS: No results for input(s): WBC, HGB, HCT, PLT in the last 72 hours. BMET Recent Labs    12/08/17 0444 12/09/17 0523 12/10/17 0511  NA 144 144 142  K 3.2* 4.6 4.4  CL 105 107 103  CO2 33* 33* 34*  GLUCOSE 94 93 102*  BUN 9 7 6   CREATININE 0.42* 0.42* 0.41*  CALCIUM 8.3* 8.3* 9.0   LFT No results for input(s): PROT, ALBUMIN, AST, ALT, ALKPHOS, BILITOT, BILIDIR, IBILI in the last 72 hours. PT/INR No results for input(s): LABPROT, INR in the last 72 hours.  STUDIES: No results found.    Impression / Plan:   Misty Green is a 47 y.o. y/o female with divertioculitis. Appears has  been ongoing on and off for over two weeks, has failed outpatient augmentin, she does feel a bit better since admission over 7 days back but not significantly. I have been consulted for my opinion .   Plan  1. I note she is undergoing a CT scan of the abdomen : if it shows improvement would continue present management, if there is worsening or no improvement would change to IV antibiotics and consider once improved a different antibiotic than augmentin as she has failed it previously. She will need a colonoscopy in 6 weeks time to rule out any underlying neoplasm.    Thank you for involving me in the care of this patient.      LOS: 7 days   Misty Bellows, MD  12/10/2017, 2:29 PM

## 2017-12-10 NOTE — Consult Note (Signed)
PHARMACY CONSULT NOTE - FOLLOW UP  Pharmacy Consult for Electrolyte Monitoring and Replacement   Recent Labs: Potassium (mmol/L)  Date Value  12/10/2017 4.4   Magnesium (mg/dL)  Date Value  12/08/2017 1.8   Calcium (mg/dL)  Date Value  12/10/2017 9.0   Albumin (g/dL)  Date Value  12/07/2017 2.6 (L)   Corrected calcium: 9.4    Assessment: 47 year old female admitted with diverticulitis. Initially she had constipation but was administered a rectal SMOG enema on 11/18 and has since had significant diarrhea  Goal of Therapy:  Magnesium 1.7 -2.4 mg/dL Potassium 3.5-5.1 mmol/L Corrected calcium > 7.5 mg/dL  Plan:  K 4.4     Scr 0.41  Patient on KCL PO 40 meq BID as ordered by MD.  No further replacement at this time. Pharmacy will follow-up morning labs and replace as needed  Chinita Greenland PharmD Clinical Pharmacist 12/10/2017

## 2017-12-10 NOTE — Progress Notes (Addendum)
[place] [facility] [place] NAME: Misty Green    MR#:  [id number]  DATE OF BIRTH:  11-10-1970  SUBJECTIVE:   Reporting abdominal pain 8.5 out of 10, diarrhea every 30 minutes with liquid stool and some blood intermittently from hemorrhoids Lactulose discontinued.  Tolerating soft diet  REVIEW OF SYSTEMS:    Review of Systems  Constitutional: Negative for fever, chills weight loss HENT: Negative for ear pain, nosebleeds, congestion, facial swelling, rhinorrhea, neck pain, neck stiffness and ear discharge.   Respiratory: Negative for cough, shortness of breath, wheezing  Cardiovascular: Negative for chest pain, palpitations and leg swelling.  Gastrointestinal: Negative for heartburn, +abdominal pain NO vomiting, her constipation resolved but now has multiple liquidy bowel movements.   Genitourinary: Negative for dysuria, urgency, frequency, hematuria Musculoskeletal: Negative for back pain or joint pain Neurological: Negative for dizziness, seizures, syncope, focal weakness,  numbness and headaches.  Hematological: Does not bruise/bleed easily.  Psychiatric/Behavioral: Negative for hallucinations, confusion, dysphoric mood  Tolerating Diet: yes  DRUG ALLERGIES:   Allergies  Allergen Reactions  . Other Nausea And Vomiting    Anti-depressent that starts with t    VITALS:  Blood pressure 127/61, pulse 87, temperature 98.5 F (36.9 C), temperature source Oral, resp. rate 20, height 5\' 3"  (1.6 m), weight 86.6 kg, SpO2 94 %.  PHYSICAL EXAMINATION:   Constitutional: Appears well-developed and well-nourished. No distress. HEENT: Normocephalic. Marland Kitchen Oropharynx is clear and moist.  Eyes: Conjunctivae and EOM are normal. PERRLA, no scleral icterus.  Neck: Normal ROM. Neck supple. No JVD. No tracheal deviation. CVS: RRR, S1/S2 +, no murmurs, no gallops, no carotid bruit.  Pulmonary: Effort and breath sounds normal, no stridor, rhonchi, wheezes,  rales.  Abdominal: Soft. BS +, no distension, patient has lower abdominal tenderness, no rebound  guarding.  Musculoskeletal: Normal range of motion. No edema and no tenderness.  Neuro: Alert. CN 2-12 grossly intact. No focal deficits. Skin: Skin is warm and dry. No rash noted. Psychiatric: Normal mood and affect.   LABORATORY PANEL:   CBC Recent Labs  Lab 12/07/17 0341  WBC 4.4  HGB 10.8*  HCT 36.0  PLT 310   ------------------------------------------------------------------------------------------------------------------  Chemistries  Recent Labs  Lab 12/07/17 0341 12/08/17 0444  12/10/17 0511  NA 144 144   < > 142  K 3.2* 3.2*   < > 4.4  CL 102 105   < > 103  CO2 34* 33*   < > 34*  GLUCOSE 90 94   < > 102*  BUN 6 9   < > 6  CREATININE 0.36* 0.42*   < > 0.41*  CALCIUM 8.2* 8.3*   < > 9.0  MG 1.9 1.8  --   --   AST 52*  --   --   --   ALT 38  --   --   --   ALKPHOS 52  --   --   --   BILITOT 0.3  --   --   --    < > = values in this interval not displayed.   ------------------------------------------------------------------------------------------------------------------  Cardiac Enzymes No results for input(s): TROPONINI in the last 168 hours. ------------------------------------------------------------------------------------------------------------------  RADIOLOGY:  Dg Chest 2 View  Result Date: 12/03/2017 CLINICAL DATA:  Hypoxia EXAM: CHEST - 2 VIEW COMPARISON:  11/28/2017 FINDINGS: Cardiac shadow is stable. Multiple calcified granulomas are again identified. No focal infiltrate is seen. Mild vascular congestion is noted with minimal interstitial edema. No bony abnormality  is seen. Hyperinflation of the lungs is noted. IMPRESSION: COPD and changes of prior granulomatous disease. Mild vascular congestion with interstitial edema. Electronically Signed   By: Inez Catalina M.D.   On: 12/03/2017 16:05   Dg Abd 1 View  Result Date: 12/04/2017 CLINICAL DATA:   Lower abdominal pain EXAM: ABDOMEN - 1 VIEW COMPARISON:  CT 12/03/2017 FINDINGS: Prior cholecystectomy. Moderate stool burden in the colon. No obstruction or free air. No organomegaly. No acute bony abnormality. IMPRESSION: Moderate stool burden.  Prior cholecystectomy.  No acute findings. Electronically Signed   By: Rolm Baptise M.D.   On: 12/04/2017 17:12   Ct Abdomen Pelvis W Contrast  Result Date: 12/03/2017 CLINICAL DATA:  Constant lower abdominal pain, loose stools and nausea. Recently diagnosed with diverticulitis. Smoker. EXAM: CT ABDOMEN AND PELVIS WITH CONTRAST TECHNIQUE: Multidetector CT imaging of the abdomen and pelvis was performed using the standard protocol following bolus administration of intravenous contrast. CONTRAST:  124mL OMNIPAQUE IOHEXOL 300 MG/ML  SOLN COMPARISON:  11/28/2017, 06/04/2010 and 05/13/2010. FINDINGS: Lower chest: 4.8 mm right lower lobe nodule on image number 2 series 4, not present on 06/04/2010. A tiny peripheral right lower lobe calcified granuloma is unchanged compared to the previous examinations as well as a tiny peripheral right lower lobe subpleural nodule, on images 8 and 5 series 4 respectively. Hepatobiliary: No focal liver abnormality is seen. Status post cholecystectomy. No biliary dilatation. Pancreas: Unremarkable. No pancreatic ductal dilatation or surrounding inflammatory changes. Spleen: Normal in size without focal abnormality. Adrenals/Urinary Tract: Tiny upper pole left renal calculus. Normal appearing right kidney, ureters and urinary bladder. Normal appearing adrenal glands. Stomach/Bowel: Small hiatal hernia. Normal appearing small bowel and appendix. Multiple colonic diverticula. Diffuse mid and distal sigmoid colon wall thickening and pericolonic soft tissue stranding with mild progression. No free peritoneal air seen. Small amount of free peritoneal fluid in the inferior pelvis with mild partially surrounding membrane enhancement adjacent to the  inflammatory changes in the sigmoid colon. The remainder of the fluid does not have surrounding membrane enhancement . Vascular/Lymphatic: Mild atheromatous arterial calcifications without aneurysm or enlarged lymph nodes. Reproductive: Status post hysterectomy. No adnexal masses. Other: Midline surgical scar. No hernia seen Musculoskeletal: Lumbar and lower thoracic spine degenerative changes. Interbody bone plugs at the L4-5 and L5-S1 levels. Bilateral L5 pars interarticularis defects with associated grade 1 anterolisthesis at the L5-S1 level, without significant change. IMPRESSION: 1. Mildly progressive changes of diverticulitis involving the mid and distal sigmoid colon without abscess. 2. Small amount of free peritoneal fluid in the inferior pelvis, increased with interval mild partially surrounding membrane enhancement adjacent to the area of diverticulitis in the sigmoid colon, without a discrete walled-off abscess at this time. 3. 4.8 mm right lower lobe lung nodule. Recommend noncontrast chest CT can be in 12 months since the patient is a smoker. This recommendation follows the consensus statement: Guidelines for Management of Incidental Pulmonary Nodules Detected on CT Images: From the Fleischner Society 2017; Radiology 2017; 284:228-243. 4. Tiny, nonobstructing upper pole left renal calculus. 5. Bilateral L5 spondylolysis with associated grade 1 spondylolisthesis at the L5-S1 level. Electronically Signed   By: Claudie Revering M.D.   On: 12/03/2017 15:42     ASSESSMENT AND PLAN:   47 year old female with a history of COPD, tobacco dependence and anxiety presented to the hospital due to worsening abdominal pain.   1.  Acute sigmoid diverticulitis:  No significant clinical improvement we will get another CT scan of the abdomen to rule out  abscess GI consult placed with Dr. Vicente Males CT scan did not show evidence of abscess or perforation.  Patient failed outpatient treatment with Augmentin.  Repeat CT  1119 scan is consistent with sigmoid diverticulitis/colitis Seen by surgery recommended to discharge patient from surgical standpoint and follow-up as an outpatient in 2 weeks with Dr. Rosana Hoes with p.o. Augmentin for  next 10 days Continue IV Rocephin and Flagyl Pain management as needed Advance diet as tolerated.  Soft diet.  2.  COPD without signs of exacerbation. Continue inhalers  3. Tobacco dependence: Patient is encouraged to quit smoking. Counseling was provided. 4.  Neuropathy: Continue gabapentin  5.  Anxiety: Continue PRN Xanax and Lexapro  6.  Constipation: Severe stool burden   stop lactulose, MiraLAX and Colace in view of persistent diarrhea  7. 4.8 mm right lower lobe lung nodule. Recommend noncontrast chest CT can be in 12 months since the patient is a smoker.  Management plans discussed with the patient and her husband in detail they are in agreement   CODE STATUS: full  TOTAL TIME TAKING CARE OF THIS PATIENT: 35 minutes.    POSSIBLE D/C 1-2 days, DEPENDING ON CLINICAL CONDITION.   Nicholes Mango M.D on 12/10/2017 at 2:16 PM  Between 7am to 6pm - Pager - [phone]  After 6pm go to [email or web address] - password [email or web address] [facility]  Office  [phone]  CC: Primary care physician; Sinda Du, MD  Note: This dictation was prepared with Dragon dictation along with smaller phrase technology. Any transcriptional errors that result from this process are unintentional.

## 2017-12-11 LAB — CBC WITH DIFFERENTIAL/PLATELET
ABS IMMATURE GRANULOCYTES: 0.03 10*3/uL (ref 0.00–0.07)
Basophils Absolute: 0 10*3/uL (ref 0.0–0.1)
Basophils Relative: 1 %
Eosinophils Absolute: 0.3 10*3/uL (ref 0.0–0.5)
Eosinophils Relative: 4 %
HEMATOCRIT: 39.1 % (ref 36.0–46.0)
HEMOGLOBIN: 11.7 g/dL — AB (ref 12.0–15.0)
Immature Granulocytes: 1 %
LYMPHS PCT: 34 %
Lymphs Abs: 2.2 10*3/uL (ref 0.7–4.0)
MCH: 30.9 pg (ref 26.0–34.0)
MCHC: 29.9 g/dL — ABNORMAL LOW (ref 30.0–36.0)
MCV: 103.2 fL — AB (ref 80.0–100.0)
MONO ABS: 0.7 10*3/uL (ref 0.1–1.0)
MONOS PCT: 11 %
NEUTROS ABS: 3.3 10*3/uL (ref 1.7–7.7)
Neutrophils Relative %: 49 %
Platelets: 326 10*3/uL (ref 150–400)
RBC: 3.79 MIL/uL — ABNORMAL LOW (ref 3.87–5.11)
RDW: 13.8 % (ref 11.5–15.5)
WBC: 6.5 10*3/uL (ref 4.0–10.5)
nRBC: 0 % (ref 0.0–0.2)

## 2017-12-11 LAB — BASIC METABOLIC PANEL
Anion gap: 8 (ref 5–15)
BUN: 6 mg/dL (ref 6–20)
CALCIUM: 9 mg/dL (ref 8.9–10.3)
CHLORIDE: 103 mmol/L (ref 98–111)
CO2: 33 mmol/L — AB (ref 22–32)
CREATININE: 0.36 mg/dL — AB (ref 0.44–1.00)
GFR calc Af Amer: >60 mL/min (ref >60)
GFR calc non Af Amer: >60 mL/min (ref >60)
Glucose, Bld: 88 mg/dL (ref 70–99)
Potassium: 4.3 mmol/L (ref 3.5–5.1)
SODIUM: 144 mmol/L (ref 135–145)

## 2017-12-11 LAB — MAGNESIUM: Magnesium: 1.8 mg/dL (ref 1.7–2.4)

## 2017-12-11 MED ORDER — FLORANEX PO PACK
1.0000 g | PACK | Freq: Three times a day (TID) | ORAL | Status: DC
  Administered 2017-12-11 – 2017-12-13 (×5): 1 g via ORAL
  Filled 2017-12-11 (×7): qty 1

## 2017-12-11 NOTE — Consult Note (Signed)
PHARMACY CONSULT NOTE - FOLLOW UP  Pharmacy Consult for Electrolyte Monitoring and Replacement   Recent Labs: Potassium (mmol/L)  Date Value  12/11/2017 4.3   Magnesium (mg/dL)  Date Value  12/11/2017 1.8   Calcium (mg/dL)  Date Value  12/11/2017 9.0   Albumin (g/dL)  Date Value  12/07/2017 2.6 (L)   Corrected calcium: 9.4    Assessment: 47 year old female admitted with diverticulitis. Initially she had constipation but was administered a rectal SMOG enema on 11/18 and has since had significant diarrhea  Goal of Therapy:  Magnesium 1.7 -2.4 mg/dL Potassium 3.5-5.1 mmol/L Corrected calcium > 7.5 mg/dL  Plan:  K 4.3  Mag 1.8     Scr 0.36  Patient on KCL PO 40 meq BID as ordered by MD.  No further replacement at this time. Pharmacy will follow-up morning labs and replace as needed  Chinita Greenland PharmD Clinical Pharmacist 12/11/2017

## 2017-12-11 NOTE — Plan of Care (Signed)
The patient has had 13 small bowel movements during day shift. MD has been notified that from the total of 13 bowel movements it could be estimated that it was around 251ml of soft/loose stool measured from all of them combined. Pain continues to be an issue for the patient .PRN pain medication has been requested every 4hrs. IV antibiotics provided. Possible discharged tomorrow per MD conversation with patient.  Problem: Education: Goal: Knowledge of General Education information will improve Description Including pain rating scale, medication(s)/side effects and non-pharmacologic comfort measures  Outcome: Progressing   Problem: Health Behavior/Discharge Planning: Goal: Ability to manage health-related needs will improve Outcome: Progressing

## 2017-12-11 NOTE — Progress Notes (Signed)
[facility] NAME: Misty Green    MR#:  [id number]  DATE OF BIRTH:  22-Jul-1970  SUBJECTIVE:   Reporting abdominal pain 7 out of 10, still diarrhea, 12 BM last night with liquid stool and some blood intermittently from hemorrhoids All laxatives  discontinued.  Tolerating  diet  REVIEW OF SYSTEMS:    Review of Systems  Constitutional: Negative for fever, chills weight loss HENT: Negative for ear pain, nosebleeds, congestion, facial swelling, rhinorrhea, neck pain, neck stiffness and ear discharge.   Respiratory: Negative for cough, shortness of breath, wheezing  Cardiovascular: Negative for chest pain, palpitations and leg swelling.  Gastrointestinal: Negative for heartburn, +abdominal pain NO vomiting, her constipation resolved but now has multiple liquidy bowel movements.   Genitourinary: Negative for dysuria, urgency, frequency, hematuria Musculoskeletal: Negative for back pain or joint pain Neurological: Negative for dizziness, seizures, syncope, focal weakness,  numbness and headaches.  Hematological: Does not bruise/bleed easily.  Psychiatric/Behavioral: Negative for hallucinations, confusion, dysphoric mood  Tolerating Diet: yes  DRUG ALLERGIES:   Allergies  Allergen Reactions  . Other Nausea And Vomiting    Anti-depressent that starts with t    VITALS:  Blood pressure 134/72, pulse 61, temperature 98.4 F (36.9 C), temperature source Oral, resp. rate 18, height 5\' 3"  (1.6 m), weight 86.6 kg, SpO2 (!) 86 %.  PHYSICAL EXAMINATION:   Constitutional: Appears well-developed and well-nourished. No distress. HEENT: Normocephalic. Marland Kitchen Oropharynx is clear and moist.  Eyes: Conjunctivae and EOM are normal. PERRLA, no scleral icterus.  Neck: Normal ROM. Neck supple. No JVD. No tracheal deviation. CVS: RRR, S1/S2 +, no murmurs, no gallops, no carotid bruit.  Pulmonary: Effort and breath sounds normal, no stridor, rhonchi,  wheezes, rales.  Abdominal: Soft. BS +, no distension, patient has lower abdominal tenderness, no rebound  guarding.  Musculoskeletal: Normal range of motion. No edema and no tenderness.  Neuro: Alert. CN 2-12 grossly intact. No focal deficits. Skin: Skin is warm and dry. No rash noted. Psychiatric: Normal mood and affect.   LABORATORY PANEL:   CBC Recent Labs  Lab 12/11/17 0626  WBC 6.5  HGB 11.7*  HCT 39.1  PLT 326   ------------------------------------------------------------------------------------------------------------------  Chemistries  Recent Labs  Lab 12/07/17 0341  12/11/17 0626  NA 144   < > 144  K 3.2*   < > 4.3  CL 102   < > 103  CO2 34*   < > 33*  GLUCOSE 90   < > 88  BUN 6   < > 6  CREATININE 0.36*   < > 0.36*  CALCIUM 8.2*   < > 9.0  MG 1.9   < > 1.8  AST 52*  --   --   ALT 38  --   --   ALKPHOS 52  --   --   BILITOT 0.3  --   --    < > = values in this interval not displayed.   ------------------------------------------------------------------------------------------------------------------  Cardiac Enzymes No results for input(s): TROPONINI in the last 168 hours. ------------------------------------------------------------------------------------------------------------------  RADIOLOGY:  Dg Chest 2 View  Result Date: 12/03/2017 CLINICAL DATA:  Hypoxia EXAM: CHEST - 2 VIEW COMPARISON:  11/28/2017 FINDINGS: Cardiac shadow is stable. Multiple calcified granulomas are again identified. No focal infiltrate is seen. Mild vascular congestion is noted with minimal interstitial edema. No bony abnormality is seen. Hyperinflation of the lungs is noted. IMPRESSION: COPD and changes of prior granulomatous disease. Mild vascular congestion  with interstitial edema. Electronically Signed   By: Inez Catalina M.D.   On: 12/03/2017 16:05   Dg Abd 1 View  Result Date: 12/04/2017 CLINICAL DATA:  Lower abdominal pain EXAM: ABDOMEN - 1 VIEW COMPARISON:  CT  12/03/2017 FINDINGS: Prior cholecystectomy. Moderate stool burden in the colon. No obstruction or free air. No organomegaly. No acute bony abnormality. IMPRESSION: Moderate stool burden.  Prior cholecystectomy.  No acute findings. Electronically Signed   By: Rolm Baptise M.D.   On: 12/04/2017 17:12   Ct Abdomen Pelvis W Contrast  Result Date: 12/03/2017 CLINICAL DATA:  Constant lower abdominal pain, loose stools and nausea. Recently diagnosed with diverticulitis. Smoker. EXAM: CT ABDOMEN AND PELVIS WITH CONTRAST TECHNIQUE: Multidetector CT imaging of the abdomen and pelvis was performed using the standard protocol following bolus administration of intravenous contrast. CONTRAST:  18mL OMNIPAQUE IOHEXOL 300 MG/ML  SOLN COMPARISON:  11/28/2017, 06/04/2010 and 05/13/2010. FINDINGS: Lower chest: 4.8 mm right lower lobe nodule on image number 2 series 4, not present on 06/04/2010. A tiny peripheral right lower lobe calcified granuloma is unchanged compared to the previous examinations as well as a tiny peripheral right lower lobe subpleural nodule, on images 8 and 5 series 4 respectively. Hepatobiliary: No focal liver abnormality is seen. Status post cholecystectomy. No biliary dilatation. Pancreas: Unremarkable. No pancreatic ductal dilatation or surrounding inflammatory changes. Spleen: Normal in size without focal abnormality. Adrenals/Urinary Tract: Tiny upper pole left renal calculus. Normal appearing right kidney, ureters and urinary bladder. Normal appearing adrenal glands. Stomach/Bowel: Small hiatal hernia. Normal appearing small bowel and appendix. Multiple colonic diverticula. Diffuse mid and distal sigmoid colon wall thickening and pericolonic soft tissue stranding with mild progression. No free peritoneal air seen. Small amount of free peritoneal fluid in the inferior pelvis with mild partially surrounding membrane enhancement adjacent to the inflammatory changes in the sigmoid colon. The remainder of  the fluid does not have surrounding membrane enhancement . Vascular/Lymphatic: Mild atheromatous arterial calcifications without aneurysm or enlarged lymph nodes. Reproductive: Status post hysterectomy. No adnexal masses. Other: Midline surgical scar. No hernia seen Musculoskeletal: Lumbar and lower thoracic spine degenerative changes. Interbody bone plugs at the L4-5 and L5-S1 levels. Bilateral L5 pars interarticularis defects with associated grade 1 anterolisthesis at the L5-S1 level, without significant change. IMPRESSION: 1. Mildly progressive changes of diverticulitis involving the mid and distal sigmoid colon without abscess. 2. Small amount of free peritoneal fluid in the inferior pelvis, increased with interval mild partially surrounding membrane enhancement adjacent to the area of diverticulitis in the sigmoid colon, without a discrete walled-off abscess at this time. 3. 4.8 mm right lower lobe lung nodule. Recommend noncontrast chest CT can be in 12 months since the patient is a smoker. This recommendation follows the consensus statement: Guidelines for Management of Incidental Pulmonary Nodules Detected on CT Images: From the Fleischner Society 2017; Radiology 2017; 284:228-243. 4. Tiny, nonobstructing upper pole left renal calculus. 5. Bilateral L5 spondylolysis with associated grade 1 spondylolisthesis at the L5-S1 level. Electronically Signed   By: Claudie Revering M.D.   On: 12/03/2017 15:42     ASSESSMENT AND PLAN:   47 year old female with a history of COPD, tobacco dependence and anxiety presented to the hospital due to worsening abdominal pain.   1.  Acute sigmoid diverticulitis: Reports feeling somewhat better today still having diarrhea 12 bowel movements in the past 24 hours GI consult placed with Dr. Jerrilyn Cairo following CT scan did not show evidence of abscess or perforation.  Patient failed  outpatient treatment with Augmentin.  Repeat CT 11/19 scan is consistent with sigmoid  diverticulitis/colitis Seen by surgery recommended to discharge patient from surgical standpoint and follow-up as an outpatient in 2 weeks with Dr. Rosana Hoes with p.o. Augmentin for  next 10 days Continue IV Rocephin and Flagyl as clinically improving Pain management as needed Advance diet as tolerated.  probiotics added to the regimen OOB  diverticular diet education by dietitian   2.  COPD without signs of exacerbation. Continue inhalers  3. Tobacco dependence: Patient is encouraged to quit smoking. Counseling was provided.  4.  Neuropathy: Continue gabapentin  5.  Anxiety: Continue PRN Xanax and Lexapro  6.  Persistent diarrhea with history of constipation: Severe stool burden  Discontinued lactulose, MiraLAX and Colace in view of persistent diarrhea Await clinical improvement.  Probiotics added to the regimen  7. 4.8 mm right lower lobe lung nodule. Recommend noncontrast chest CT can be in 12 months since the patient is a smoker.  Management plans discussed with the patient and her husband in detail they are in agreement   CODE STATUS: full  TOTAL TIME TAKING CARE OF THIS PATIENT: 35 minutes.    POSSIBLE D/C 1-2 days, DEPENDING ON CLINICAL CONDITION.   Nicholes Mango M.D on 12/11/2017 at 1:47 PM  Between 7am to 6pm - Pager - [phone]  After 6pm go to [email or web address] - password [email or web address] [facility]  Office  [phone]  CC: Primary care physician; Sinda Du, MD  Note: This dictation was prepared with Dragon dictation along with smaller phrase technology. Any transcriptional errors that result from this process are unintentional.

## 2017-12-11 NOTE — Progress Notes (Signed)
Nutrition Brief Note  RD received consult for diet education regarding diverticulitis and fiber.  Same consult was also received earlier this admission and education was provided on 11/17. Please see RD note from 11/17. Handouts were provided to patient and husband regarding recommended fiber intake at that time.  Willey Blade, MS, [place], LDN Office: [phone] Pager: [phone] After Hours/Weekend Pager: [phone]

## 2017-12-12 LAB — CBC WITH DIFFERENTIAL/PLATELET
ABS IMMATURE GRANULOCYTES: 0.04 10*3/uL (ref 0.00–0.07)
BASOS PCT: 1 %
Basophils Absolute: 0.1 10*3/uL (ref 0.0–0.1)
Eosinophils Absolute: 0.3 10*3/uL (ref 0.0–0.5)
Eosinophils Relative: 4 %
HEMATOCRIT: 39.8 % (ref 36.0–46.0)
Hemoglobin: 11.8 g/dL — ABNORMAL LOW (ref 12.0–15.0)
IMMATURE GRANULOCYTES: 1 %
LYMPHS ABS: 1.8 10*3/uL (ref 0.7–4.0)
Lymphocytes Relative: 23 %
MCH: 30.9 pg (ref 26.0–34.0)
MCHC: 29.6 g/dL — ABNORMAL LOW (ref 30.0–36.0)
MCV: 104.2 fL — AB (ref 80.0–100.0)
MONOS PCT: 10 %
Monocytes Absolute: 0.8 10*3/uL (ref 0.1–1.0)
NEUTROS ABS: 4.7 10*3/uL (ref 1.7–7.7)
NEUTROS PCT: 61 %
Platelets: 349 10*3/uL (ref 150–400)
RBC: 3.82 MIL/uL — ABNORMAL LOW (ref 3.87–5.11)
RDW: 14 % (ref 11.5–15.5)
WBC: 7.7 10*3/uL (ref 4.0–10.5)
nRBC: 0 % (ref 0.0–0.2)

## 2017-12-12 LAB — BASIC METABOLIC PANEL
ANION GAP: 11 (ref 5–15)
BUN: 6 mg/dL (ref 6–20)
CALCIUM: 8.9 mg/dL (ref 8.9–10.3)
CO2: 33 mmol/L — AB (ref 22–32)
Chloride: 98 mmol/L (ref 98–111)
Creatinine, Ser: 0.45 mg/dL (ref 0.44–1.00)
GFR calc Af Amer: >60 mL/min (ref >60)
GLUCOSE: 108 mg/dL — AB (ref 70–99)
POTASSIUM: 4.5 mmol/L (ref 3.5–5.1)
Sodium: 142 mmol/L (ref 135–145)

## 2017-12-12 LAB — GLUCOSE, CAPILLARY: Glucose-Capillary: 73 mg/dL (ref 70–99)

## 2017-12-12 MED ORDER — MORPHINE SULFATE (PF) 2 MG/ML IV SOLN
2.0000 mg | Freq: Once | INTRAVENOUS | Status: AC
  Administered 2017-12-12: 2 mg via INTRAVENOUS
  Filled 2017-12-12: qty 1

## 2017-12-12 MED ORDER — FUROSEMIDE 10 MG/ML IJ SOLN
40.0000 mg | Freq: Once | INTRAMUSCULAR | Status: AC
  Administered 2017-12-12: 40 mg via INTRAVENOUS
  Filled 2017-12-12: qty 4

## 2017-12-12 NOTE — Progress Notes (Signed)
[facility] [place] NAME: Misty Green    MR#:  [id number]  DATE OF BIRTH:  1970/09/22  SUBJECTIVE:   Seen at bedside, still complains of left lower quadrant abdominal pain, nausea.  Has diarrhea says that she went 20 times since yesterday, even though quantity of stool is.  Complains of edema in the legs. REVIEW OF SYSTEMS:    Review of Systems  Constitutional: Negative for fever, chills weight loss HENT: Negative for ear pain, nosebleeds, congestion, facial swelling, rhinorrhea, neck pain, neck stiffness and ear discharge.   Respiratory: Negative for cough, shortness of breath, wheezing  Cardiovascular: Negative for chest pain, palpitations and leg swelling.  Gastrointestinal: Negative for heartburn, +abdominal pain NO vomiting, her constipation resolved but now has multiple liquidy bowel movements.   Genitourinary: Negative for dysuria, urgency, frequency, hematuria Musculoskeletal: Negative for back pain or joint pain Neurological: Negative for dizziness, seizures, syncope, focal weakness,  numbness and headaches.  Hematological: Does not bruise/bleed easily.  Psychiatric/Behavioral: Negative for hallucinations, confusion, dysphoric mood  Tolerating Diet: yes  DRUG ALLERGIES:   Allergies  Allergen Reactions  . Other Nausea And Vomiting    Anti-depressent that starts with t    VITALS:  Blood pressure 106/75, pulse 90, temperature 98.3 F (36.8 C), temperature source Oral, resp. rate 19, height 5\' 3"  (1.6 m), weight 86.6 kg, SpO2 (!) 63 %.  PHYSICAL EXAMINATION:   Constitutional: Appears well-developed and well-nourished. No distress. HEENT: Normocephalic. Marland Kitchen Oropharynx is clear and moist.  Eyes: Conjunctivae and EOM are normal. PERRLA, no scleral icterus.  Neck: Normal ROM. Neck supple. No JVD. No tracheal deviation. CVS: RRR, S1/S2 +, no murmurs, no gallops, no carotid bruit.  Pulmonary: Effort and breath sounds normal, no  stridor, rhonchi, wheezes, rales.  Abdominal: Soft. BS +, no distension, patient has left  lower abdominal tenderness, no rebound  guarding.  Musculoskeletal: Normal range of motion.  Bilateral1 plus pitting edema of the legs.  Neuro: Alert. CN 2-12 grossly intact. No focal deficits. Skin: Skin is warm and dry. No rash noted. Psychiatric: Normal mood and affect.   LABORATORY PANEL:   CBC Recent Labs  Lab 12/12/17 0705  WBC 7.7  HGB 11.8*  HCT 39.8  PLT 349   ------------------------------------------------------------------------------------------------------------------  Chemistries  Recent Labs  Lab 12/07/17 0341  12/11/17 0626 12/12/17 0705  NA 144   < > 144 142  K 3.2*   < > 4.3 4.5  CL 102   < > 103 98  CO2 34*   < > 33* 33*  GLUCOSE 90   < > 88 108*  BUN 6   < > 6 6  CREATININE 0.36*   < > 0.36* 0.45  CALCIUM 8.2*   < > 9.0 8.9  MG 1.9   < > 1.8  --   AST 52*  --   --   --   ALT 38  --   --   --   ALKPHOS 52  --   --   --   BILITOT 0.3  --   --   --    < > = values in this interval not displayed.   ------------------------------------------------------------------------------------------------------------------  Cardiac Enzymes No results for input(s): TROPONINI in the last 168 hours. ------------------------------------------------------------------------------------------------------------------  RADIOLOGY:  Dg Chest 2 View  Result Date: 12/03/2017 CLINICAL DATA:  Hypoxia EXAM: CHEST - 2 VIEW COMPARISON:  11/28/2017 FINDINGS: Cardiac shadow is stable. Multiple calcified granulomas are again identified. No  focal infiltrate is seen. Mild vascular congestion is noted with minimal interstitial edema. No bony abnormality is seen. Hyperinflation of the lungs is noted. IMPRESSION: COPD and changes of prior granulomatous disease. Mild vascular congestion with interstitial edema. Electronically Signed   By: Inez Catalina M.D.   On: 12/03/2017 16:05   Dg Abd 1  View  Result Date: 12/04/2017 CLINICAL DATA:  Lower abdominal pain EXAM: ABDOMEN - 1 VIEW COMPARISON:  CT 12/03/2017 FINDINGS: Prior cholecystectomy. Moderate stool burden in the colon. No obstruction or free air. No organomegaly. No acute bony abnormality. IMPRESSION: Moderate stool burden.  Prior cholecystectomy.  No acute findings. Electronically Signed   By: Rolm Baptise M.D.   On: 12/04/2017 17:12   Ct Abdomen Pelvis W Contrast  Result Date: 12/03/2017 CLINICAL DATA:  Constant lower abdominal pain, loose stools and nausea. Recently diagnosed with diverticulitis. Smoker. EXAM: CT ABDOMEN AND PELVIS WITH CONTRAST TECHNIQUE: Multidetector CT imaging of the abdomen and pelvis was performed using the standard protocol following bolus administration of intravenous contrast. CONTRAST:  158mL OMNIPAQUE IOHEXOL 300 MG/ML  SOLN COMPARISON:  11/28/2017, 06/04/2010 and 05/13/2010. FINDINGS: Lower chest: 4.8 mm right lower lobe nodule on image number 2 series 4, not present on 06/04/2010. A tiny peripheral right lower lobe calcified granuloma is unchanged compared to the previous examinations as well as a tiny peripheral right lower lobe subpleural nodule, on images 8 and 5 series 4 respectively. Hepatobiliary: No focal liver abnormality is seen. Status post cholecystectomy. No biliary dilatation. Pancreas: Unremarkable. No pancreatic ductal dilatation or surrounding inflammatory changes. Spleen: Normal in size without focal abnormality. Adrenals/Urinary Tract: Tiny upper pole left renal calculus. Normal appearing right kidney, ureters and urinary bladder. Normal appearing adrenal glands. Stomach/Bowel: Small hiatal hernia. Normal appearing small bowel and appendix. Multiple colonic diverticula. Diffuse mid and distal sigmoid colon wall thickening and pericolonic soft tissue stranding with mild progression. No free peritoneal air seen. Small amount of free peritoneal fluid in the inferior pelvis with mild partially  surrounding membrane enhancement adjacent to the inflammatory changes in the sigmoid colon. The remainder of the fluid does not have surrounding membrane enhancement . Vascular/Lymphatic: Mild atheromatous arterial calcifications without aneurysm or enlarged lymph nodes. Reproductive: Status post hysterectomy. No adnexal masses. Other: Midline surgical scar. No hernia seen Musculoskeletal: Lumbar and lower thoracic spine degenerative changes. Interbody bone plugs at the L4-5 and L5-S1 levels. Bilateral L5 pars interarticularis defects with associated grade 1 anterolisthesis at the L5-S1 level, without significant change. IMPRESSION: 1. Mildly progressive changes of diverticulitis involving the mid and distal sigmoid colon without abscess. 2. Small amount of free peritoneal fluid in the inferior pelvis, increased with interval mild partially surrounding membrane enhancement adjacent to the area of diverticulitis in the sigmoid colon, without a discrete walled-off abscess at this time. 3. 4.8 mm right lower lobe lung nodule. Recommend noncontrast chest CT can be in 12 months since the patient is a smoker. This recommendation follows the consensus statement: Guidelines for Management of Incidental Pulmonary Nodules Detected on CT Images: From the Fleischner Society 2017; Radiology 2017; 284:228-243. 4. Tiny, nonobstructing upper pole left renal calculus. 5. Bilateral L5 spondylolysis with associated grade 1 spondylolisthesis at the L5-S1 level. Electronically Signed   By: Claudie Revering M.D.   On: 12/03/2017 15:42     ASSESSMENT AND PLAN:   47 year old female with a history of COPD, tobacco dependence and anxiety presented to the hospital due to worsening abdominal pain.   1.  Acute sigmoid diverticulitis: Reports  feeling somewhat better today still having diarrhea 12 bowel movements in the past 24 hours GI consult placed with Dr. Jerrilyn Cairo following CT scan did not show evidence of abscess or perforation.   Patient failed outpatient treatment with Augmentin.  Repeat CT 11/19 scan is consistent with sigmoid diverticulitis/colitis Seen by surgery recommended to discharge patient from surgical standpoint  Continue IV Rocephin and Flagyl as clinically improving Pain management as needed Advance diet as tolerated.  probiotics added to the regimen OOB  diverticular diet education by dietitian  Discharge home tomorrow with Flagyl, Cipro for 2 weeks  2.  COPD without signs of exacerbation. Continue inhalers, patient in the process of quitting smoking, sees she smokes only 1/2 cigarettes a day.  3. Tobacco dependence: Patient is encouraged to quit smoking. Counseling was provided.  4.  Neuropathy: Continue gabapentin  5.  Anxiety: Continue PRN Xanax and Lexapro  6.  Persistent diarrhea with history of constipation: Severe stool burden  Discontinued lactulose, MiraLAX and Colace in view of persistent diarrhea Await clinical improvement.  Probiotics added to the regimen  7. 4.8 mm right lower lobe lung nodule. Recommend noncontrast chest CT can be in 12 months since the patient is a smoker.  Management plans discussed with the patient and her husband in detail they are in agreement   CODE STATUS: full  TOTAL TIME TAKING CARE OF THIS PATIENT: 35 minutes.    POSSIBLE D/C 1-2 days, DEPENDING ON CLINICAL CONDITION.   Epifanio Lesches M.D on 12/12/2017 at 12:24 PM  Between 7am to 6pm - Pager - [phone]  After 6pm go to [email or web address] - password [email or web address] [facility]  Office  [phone]  CC: Primary care physician; Sinda Du, MD  Note: This dictation was prepared with Dragon dictation along with smaller phrase technology. Any transcriptional errors that result from this process are unintentional.

## 2017-12-12 NOTE — Progress Notes (Signed)
Patient in room packing and dressing stating she is going home as soon as she gets in touch with her husband " she is tired of being judged and treated like a drug addict". Emotional support given. She states that it is nothing that the staff has done or said tonight but that earlier in the week she was treated that way and reported her to our boss. Allowed to ventilate feelings. Another nurse came in and talked with the patient, TK. She states she is tried and hasn't slept and she vomited because she ate too much, graham crackers and peanut butter at bedtime then after midnight she drank 2 cups of coffee and ate a [place] sandwich tray. She continues to pack her things up determined to leave. NSupervisor notified. Will monitor

## 2017-12-12 NOTE — Consult Note (Addendum)
PHARMACY CONSULT NOTE - FOLLOW UP  Pharmacy Consult for Electrolyte Monitoring and Replacement   Recent Labs: Potassium (mmol/L)  Date Value  12/12/2017 4.5   Magnesium (mg/dL)  Date Value  12/11/2017 1.8   Calcium (mg/dL)  Date Value  12/12/2017 8.9   Albumin (g/dL)  Date Value  12/07/2017 2.6 (L)     Assessment: 47 year old female admitted with diverticulitis. Initially she had constipation but was administered a rectal SMOG enema on 11/18 and has since had significant diarrhea  Goal of Therapy:  Magnesium 1.7 -2.4 mg/dL Potassium 3.5-5.1 mmol/L Corrected calcium > 7.5 mg/dL  Plan:  K 4.5  Scr 0.45 - Pt received a dose of lasix 40 mg IV x1 today. Pt with continued diarrhea. Patient on KCL PO 40 meq BID as ordered by MD.  No further replacement at this time.  Pharmacy will follow-up morning labs and replace as needed  Rayna Sexton, PharmD, BCPS Clinical Pharmacist 12/12/2017 2:16 PM

## 2017-12-13 LAB — BASIC METABOLIC PANEL
ANION GAP: 10 (ref 5–15)
BUN: 5 mg/dL — ABNORMAL LOW (ref 6–20)
CALCIUM: 8.8 mg/dL — AB (ref 8.9–10.3)
CO2: 35 mmol/L — ABNORMAL HIGH (ref 22–32)
Chloride: 95 mmol/L — ABNORMAL LOW (ref 98–111)
Creatinine, Ser: 0.41 mg/dL — ABNORMAL LOW (ref 0.44–1.00)
GFR calc Af Amer: >60 mL/min (ref >60)
GFR calc non Af Amer: >60 mL/min (ref >60)
Glucose, Bld: 93 mg/dL (ref 70–99)
Potassium: 4.7 mmol/L (ref 3.5–5.1)
Sodium: 140 mmol/L (ref 135–145)

## 2017-12-13 MED ORDER — OXYCODONE HCL 5 MG PO TABS: 5.0000 mg | ORAL_TABLET | ORAL | 0 refills | Status: DC | PRN

## 2017-12-13 MED ORDER — CIPROFLOXACIN HCL 500 MG PO TABS: 500.0000 mg | ORAL_TABLET | Freq: Two times a day (BID) | ORAL | 0 refills | Status: AC

## 2017-12-13 MED ORDER — METRONIDAZOLE 500 MG PO TABS: 500.0000 mg | ORAL_TABLET | Freq: Three times a day (TID) | ORAL | 0 refills | Status: AC

## 2017-12-13 MED ORDER — FLORANEX PO PACK: 1.0000 g | PACK | Freq: Three times a day (TID) | ORAL | 0 refills | Status: DC

## 2017-12-13 NOTE — Consult Note (Signed)
PHARMACY CONSULT NOTE - FOLLOW UP  Pharmacy Consult for Electrolyte Monitoring and Replacement   Recent Labs: Potassium (mmol/L)  Date Value  12/13/2017 4.7   Magnesium (mg/dL)  Date Value  12/11/2017 1.8   Calcium (mg/dL)  Date Value  12/13/2017 8.8 (L)   Albumin (g/dL)  Date Value  12/07/2017 2.6 (L)     Assessment: 47 year old female admitted with diverticulitis. Initially she had constipation but was administered a rectal SMOG enema on 11/18 and has since had significant diarrhea  Goal of Therapy:  Magnesium 1.7 -2.4 mg/dL Potassium 3.5-5.1 mmol/L Corrected calcium > 7.5 mg/dL  Plan:  K 4.7  Scr 0.41Pt with continued diarrhea. Patient on KCL PO 40 meq BID as ordered by MD.  No further replacement at this time.  Pharmacy will follow-up morning labs and replace as needed  Evelena Asa, PharmD Clinical Pharmacist 12/13/2017 7:07 AM

## 2017-12-13 NOTE — Progress Notes (Signed)
Pt discharged per MD order. IV removed. Discharge instuctions reviewed with pt. Prescription given to pt. Pt verbalized understanding of instructions. Pt taken to car in wheelchair by staff.

## 2017-12-13 NOTE — Progress Notes (Signed)
Patient stable for discharge home, continue Cipro, Flagyl for 1 week, given appointment with Dr. [name] on her to follow-up with in 4 weeks for possible colonoscopy as an outpatient.  Patient diarrhea improved.  And eager to go home today.

## 2017-12-17 NOTE — Discharge Summary (Signed)
Misty Green, is a 47 y.o. female  DOB 02/04/1970  MRN [id number].  Admission date:  12/03/2017  Admitting Physician  Henreitta Leber, MD  Discharge Date:  12/13/2017   Primary MD  Sinda Du, MD  Recommendations for primary care physician for things to follow:  Follow-up with PCP in 1 week   Admission Diagnosis  Diverticulitis [K57.92] COPD exacerbation ([place]) [J44.1] Lower abdominal pain [R10.30]   Discharge Diagnosis  Diverticulitis [K57.92] COPD exacerbation ([place]) [J44.1] Lower abdominal pain [R10.30]    Active Problems:   Diverticulitis      Past Medical History:  Diagnosis Date  . COPD (chronic obstructive pulmonary disease) ([place])   . Current smoker   . Opiate abuse, continuous ([place])     Past Surgical History:  Procedure Laterality Date  . ABDOMINAL HYSTERECTOMY    . BACK SURGERY    . cesction    . INCISIONAL HERNIA REPAIR     lower midline laparotomy incision (hysterectomy), repaired with mesh  . LAPAROSCOPIC CHOLECYSTECTOMY  2012  . ORIF WRIST FRACTURE Right 08/25/2015   Procedure: OPEN REDUCTION INTERNAL FIXATION (ORIF) WRIST FRACTURE;  Surgeon: Corky Mull, MD;  Location: [facility] ORS;  Service: Orthopedics;  Laterality: Right;       History of present illness and  Hospital Course:     Kindly see H&P for history of present illness and admission details, please review complete Labs, Consult reports and Test reports for all details in brief  HPI  from the history and physical done on the day of admission 47 year old female with history of tobacco abuse, opiate abuse, anxiety, neuropathy came in because of abdominal pain, nausea and found to have sigmoid colon diverticulitis on CT abdomen.  Patient was taking Augmentin as an outpatient before she is admitted.  Patient had been complaining of  nausea, vomiting, diarrhea going on for 3 weeks.   Hospital Course  #1 acute diverticulitis causing abdominal pain, vomiting, diarrhea, patient had left lower quadrant abdominal pain.  Patient received IV meropenem, Flagyl.  Patient seen by gastroenterology, surgery.  Identified patient can be treated medically and did not recommend any surgical intervention.  Seen by gastroenterology Dr. Caleen Jobs he recommended to continue an IV antibiotics and is scheduled for colonoscopy as outpatient in 6 weeks.  Advised the patient about the same.  Discharge home in stable condition, discharged home with Cipro, Flagyl for 7 days.  Discontinue stool softeners, diarrhea improved. 2.. Anxiety: Continue Xanax. 3.  Essential hypertension, controlled, patient takes Lasix occasionally to help with leg edema. 4.  History of COPD, patient is in the process of quitting smoking, continue her inhalers. 5.  Narcotics and behavior, patient keeps complaining of left lower quadrant abdominal pain even though she finishes her meals.  Given limited supply of Percocet to help with residual abdominal pain due to diverticulitis.    Discharge Condition: Stable   Follow UP  Follow-up Information    Vickie Epley, MD. Go on 12/22/2017.   Specialty:  General Surgery Why:  at 10:00 am on 12/5 Contact information: [address] [place] [address] [phone]        Sinda Du, MD. Go on 12/23/2017.   Specialty:  Pulmonary Disease Why:  @10 :30a Contact information: [address] [place] [address] [phone]        [name], MD. Go on 01/30/2018.   Specialty:  Gastroenterology Why:  @1 :30p Contact information: [place] [address]  [phone]             Discharge Instructions  and  Discharge Medications      Allergies as of 12/13/2017      Reactions   Other Nausea And Vomiting   Anti-depressent that starts with t       Medication List    STOP taking these medications   amoxicillin-clavulanate 875-125 MG tablet Commonly known as:  AUGMENTIN   diphenhydrAMINE 25 mg capsule Commonly known as:  BENADRYL   diphenhydramine-acetaminophen 25-500 MG Tabs tablet Commonly known as:  TYLENOL PM   docusate sodium 100 MG capsule Commonly known as:  COLACE   predniSONE 10 MG tablet Commonly known as:  DELTASONE   promethazine 25 MG tablet Commonly known as:  PHENERGAN     TAKE these medications   albuterol 108 (90 Base) MCG/ACT inhaler Commonly known as:  PROVENTIL HFA;VENTOLIN HFA Inhale into the lungs every 6 (six) hours as needed for wheezing or shortness of breath.   ALPRAZolam 1 MG tablet Commonly known as:  XANAX Take 1 mg by mouth 4 (four) times daily.   ciprofloxacin 500 MG tablet Commonly known as:  CIPRO Take 1 tablet (500 mg total) by mouth 2 (two) times daily for 7 days.   dicyclomine 20 MG tablet Commonly known as:  BENTYL Take 1 tablet (20 mg total) by mouth 3 (three) times daily as needed for spasms.   Fluticasone-Salmeterol 250-50 MCG/DOSE Aepb Commonly known as:  ADVAIR Inhale 1 puff into the lungs 2 (two) times daily.   furosemide 40 MG tablet Commonly known as:  LASIX Take 40 mg by mouth daily as needed for fluid.   gabapentin 300 MG capsule Commonly known as:  NEURONTIN Take 300 mg by mouth 3 (three) times daily.   lactobacillus Pack Take 1 packet (1 g total) by mouth 3 (three) times daily with meals.   metroNIDAZOLE 500 MG tablet Commonly known as:  FLAGYL Take 1 tablet (500 mg total) by mouth 3 (three) times daily for 7 days.   multivitamin with minerals tablet Take 1 tablet by mouth daily.   ondansetron 4 MG disintegrating tablet Commonly known as:  ZOFRAN-ODT Take 1 tablet (4 mg total) by mouth every 8 (eight) hours as needed for nausea or vomiting.   oxyCODONE 5 MG immediate release tablet Commonly known as:  Oxy IR/ROXICODONE Take 1-2 tablets (5-10 mg  total) by mouth every 4 (four) hours as needed for severe pain. What changed:    medication strength  how much to take  when to take this  reasons to take this   roflumilast 500 MCG Tabs tablet Commonly known as:  DALIRESP Take 500 mcg by mouth daily.   tiotropium 18 MCG inhalation capsule Commonly known as:  SPIRIVA Place 18 mcg into inhaler and inhale daily.   vitamin C 1000 MG tablet Take 1,000 mg by mouth daily.   zolpidem 10 MG tablet Commonly known as:  AMBIEN Take 10 mg by mouth at bedtime as needed for sleep.     ASK your doctor about these medications   polyethylene glycol packet Commonly known as:  MIRALAX / GLYCOLAX Take 17 g by mouth daily for 7 days. Ask about: Should I take this medication?         Diet and Activity recommendation: See Discharge Instructions above   Consults obtained -surgery, gastroenterology   Major procedures and Radiology Reports - PLEASE review detailed and final reports for all details, in brief -  Dg Chest 2 View  Result Date: 12/03/2017 CLINICAL DATA:  Hypoxia EXAM: CHEST - 2 VIEW COMPARISON:  11/28/2017 FINDINGS: Cardiac shadow is stable. Multiple calcified granulomas are again identified. No focal infiltrate is seen. Mild vascular congestion is noted with minimal interstitial edema. No bony abnormality is seen. Hyperinflation of the lungs is noted. IMPRESSION: COPD and changes of prior granulomatous disease. Mild vascular congestion with interstitial edema. Electronically Signed   By: Inez Catalina M.D.   On: 12/03/2017 16:05   Dg Chest 2 View  Result Date: 11/28/2017 CLINICAL DATA:  Vomiting for the last day associated with shortness of breath and fever to 101 disease. Also bilateral flank pain. History of COPD, current smoker. EXAM: CHEST - 2 VIEW COMPARISON:  PA and lateral chest x-ray of March 21, 2016 FINDINGS: The lungs are mildly hyperinflated. There are stable calcified subcentimeter nodules bilaterally  consistent with previous granulomatous infection. The heart and pulmonary vascularity are normal. The mediastinum is normal in width. There is mild multilevel degenerative disc disease of the thoracic spine. IMPRESSION: Chronic bronchitic changes and evidence of previous granulomatous infection, stable. No acute pneumonia nor other acute cardiopulmonary abnormality. Electronically Signed   By: David  Martinique M.D.   On: 11/28/2017 15:36   Dg Abd 1 View  Result Date: 12/04/2017 CLINICAL DATA:  Lower abdominal pain EXAM: ABDOMEN - 1 VIEW COMPARISON:  CT 12/03/2017 FINDINGS: Prior cholecystectomy. Moderate stool burden in the colon. No obstruction or free air. No organomegaly. No acute bony abnormality. IMPRESSION: Moderate stool burden.  Prior cholecystectomy.  No acute findings. Electronically Signed   By: Rolm Baptise M.D.   On: 12/04/2017 17:12   Ct Abdomen Pelvis W Contrast  Result Date: 12/10/2017 CLINICAL DATA:  47 year old female with history of diverticulitis presenting with left-sided abdominal pain and diarrhea. EXAM: CT ABDOMEN AND PELVIS WITH CONTRAST TECHNIQUE: Multidetector CT imaging of the abdomen and pelvis was performed using the standard protocol following bolus administration of intravenous contrast. CONTRAST:  157mL ISOVUE-300 IOPAMIDOL (ISOVUE-300) INJECTION 61% COMPARISON:  CT the abdomen and pelvis 12/06/2017. FINDINGS: Lower chest: Tiny calcified granulomas throughout the visualize lung bases. Hepatobiliary: No suspicious cystic or solid hepatic lesions. No intra or extrahepatic biliary ductal dilatation. Status post cholecystectomy. Pancreas: No pancreatic mass. No pancreatic ductal dilatation. No pancreatic or peripancreatic fluid or inflammatory changes. Spleen: Unremarkable. Adrenals/Urinary Tract: 1.2 cm low-attenuation lesion in the lower pole the right kidney, compatible with a simple cyst. Left kidney and bilateral adrenal glands are normal in appearance. No  hydroureteronephrosis. Urinary bladder is normal in appearance. Stomach/Bowel: Normal appearance of the stomach. No pathologic dilatation of small bowel or colon. No pathologic dilatation of small bowel or colon. Numerous colonic diverticulae are noted, particularly in the sigmoid colon, where there is also some mural thickening and surrounding inflammatory changes in the sigmoid mesocolon, compatible with an acute cholecystitis. No discrete diverticular abscess. No definite findings to strongly suggest perforation at this time. Normal appendix. Vascular/Lymphatic: Aortic atherosclerosis, without evidence of aneurysm or dissection in the abdominal or pelvic vasculature. No lymphadenopathy noted in the abdomen or pelvis. Reproductive: Status post hysterectomy. Ovaries are not confidently identified may be surgically absent or atrophic. Other: No significant volume of ascites.  No pneumoperitoneum Musculoskeletal: Interbody grafts are noted at L4-L5 and L5-S1. There are no aggressive appearing lytic or blastic lesions noted in the visualized portions of the skeleton. IMPRESSION: 1. Acute diverticulitis of the mid sigmoid colon. No discrete diverticular abscess or signs of frank perforation are noted at  this time. 2. Aortic atherosclerosis. 3. Additional incidental findings, as above. These results will be called to the ordering clinician or representative by the Radiologist Assistant, and communication documented in the PACS or zVision Dashboard. Electronically Signed   By: Vinnie Langton M.D.   On: 12/10/2017 21:09   Ct Abdomen Pelvis W Contrast  Result Date: 12/06/2017 CLINICAL DATA:  Worsening abdominal pain suspected diverticulitis, 7 bowel movements in the past 2 days with increased pain, history COPD, smoker EXAM: CT ABDOMEN AND PELVIS WITH CONTRAST TECHNIQUE: Multidetector CT imaging of the abdomen and pelvis was performed using the standard protocol following bolus administration of intravenous  contrast. Sagittal and coronal MPR images reconstructed from axial data set. CONTRAST:  133mL ISOVUE-300 IOPAMIDOL (ISOVUE-300) INJECTION 61% IV. No oral contrast. COMPARISON:  12/03/2017 FINDINGS: Lower chest: Lung bases clear Hepatobiliary: Gallbladder surgically absent.  Liver unremarkable. Pancreas: Normal appearance Spleen: Normal appearance Adrenals/Urinary Tract: Adrenal glands normal appearance. Tiny nonobstructing calculus at inferior pole LEFT kidney. Question small RIGHT renal cyst 15 x 11 mm at inferior pole. Kidneys, ureters and bladder otherwise unremarkable. Stomach/Bowel: Normal appendix. Mobile cecum near midline. Diverticulosis of transverse through sigmoid colon. Wall thickening of a long segment of sigmoid colon question colitis versus diverticulitis. Edema within sigmoid mesocolon. No evidence of bowel obstruction. Stomach decompressed. Remaining bowel loops unremarkable. Vascular/Lymphatic: Atherosclerotic calcifications aorta without aneurysm. Vascular structures grossly patent. No adenopathy. Reproductive: Uterus surgically absent. Nonvisualization of ovaries. Other: No evidence of pericolic abscess or extraluminal gas. No free air or free fluid. Prior ventral hernia repair umbilical/infraumbilical. Musculoskeletal: Prior L4-L5 and L5-S1 fusion. IMPRESSION: Colonic diverticulosis from transverse through sigmoid colon. Long segmental wall thickening and pericolic inflammatory changes at the sigmoid colon either representing colitis or diverticulitis. Colonoscopy recommended following resolution of patient's acute process to exclude underlying abnormalities of the thickened sigmoid colon including tumor. Tiny nonobstructing LEFT renal calculus and small RIGHT renal cyst. Remainder of exam unremarkable. Electronically Signed   By: Lavonia Dana M.D.   On: 12/06/2017 13:57   Ct Abdomen Pelvis W Contrast  Result Date: 12/03/2017 CLINICAL DATA:  Constant lower abdominal pain, loose stools and  nausea. Recently diagnosed with diverticulitis. Smoker. EXAM: CT ABDOMEN AND PELVIS WITH CONTRAST TECHNIQUE: Multidetector CT imaging of the abdomen and pelvis was performed using the standard protocol following bolus administration of intravenous contrast. CONTRAST:  138mL OMNIPAQUE IOHEXOL 300 MG/ML  SOLN COMPARISON:  11/28/2017, 06/04/2010 and 05/13/2010. FINDINGS: Lower chest: 4.8 mm right lower lobe nodule on image number 2 series 4, not present on 06/04/2010. A tiny peripheral right lower lobe calcified granuloma is unchanged compared to the previous examinations as well as a tiny peripheral right lower lobe subpleural nodule, on images 8 and 5 series 4 respectively. Hepatobiliary: No focal liver abnormality is seen. Status post cholecystectomy. No biliary dilatation. Pancreas: Unremarkable. No pancreatic ductal dilatation or surrounding inflammatory changes. Spleen: Normal in size without focal abnormality. Adrenals/Urinary Tract: Tiny upper pole left renal calculus. Normal appearing right kidney, ureters and urinary bladder. Normal appearing adrenal glands. Stomach/Bowel: Small hiatal hernia. Normal appearing small bowel and appendix. Multiple colonic diverticula. Diffuse mid and distal sigmoid colon wall thickening and pericolonic soft tissue stranding with mild progression. No free peritoneal air seen. Small amount of free peritoneal fluid in the inferior pelvis with mild partially surrounding membrane enhancement adjacent to the inflammatory changes in the sigmoid colon. The remainder of the fluid does not have surrounding membrane enhancement . Vascular/Lymphatic: Mild atheromatous arterial calcifications without aneurysm or enlarged lymph  nodes. Reproductive: Status post hysterectomy. No adnexal masses. Other: Midline surgical scar. No hernia seen Musculoskeletal: Lumbar and lower thoracic spine degenerative changes. Interbody bone plugs at the L4-5 and L5-S1 levels. Bilateral L5 pars interarticularis  defects with associated grade 1 anterolisthesis at the L5-S1 level, without significant change. IMPRESSION: 1. Mildly progressive changes of diverticulitis involving the mid and distal sigmoid colon without abscess. 2. Small amount of free peritoneal fluid in the inferior pelvis, increased with interval mild partially surrounding membrane enhancement adjacent to the area of diverticulitis in the sigmoid colon, without a discrete walled-off abscess at this time. 3. 4.8 mm right lower lobe lung nodule. Recommend noncontrast chest CT can be in 12 months since the patient is a smoker. This recommendation follows the consensus statement: Guidelines for Management of Incidental Pulmonary Nodules Detected on CT Images: From the Fleischner Society 2017; Radiology 2017; 284:228-243. 4. Tiny, nonobstructing upper pole left renal calculus. 5. Bilateral L5 spondylolysis with associated grade 1 spondylolisthesis at the L5-S1 level. Electronically Signed   By: Claudie Revering M.D.   On: 12/03/2017 15:42   Ct Abdomen Pelvis W Contrast  Result Date: 11/28/2017 CLINICAL DATA:  Diarrhea, fever EXAM: CT ABDOMEN AND PELVIS WITH CONTRAST TECHNIQUE: Multidetector CT imaging of the abdomen and pelvis was performed using the standard protocol following bolus administration of intravenous contrast. CONTRAST:  157mL ISOVUE-300 IOPAMIDOL (ISOVUE-300) INJECTION 61% COMPARISON:  CT 01/25/2014 FINDINGS: Lower chest: Lung bases demonstrate no acute consolidation or effusion. Normal heart size. Hepatobiliary: No focal liver abnormality is seen. Status post cholecystectomy. No biliary dilatation. Pancreas: Unremarkable. No pancreatic ductal dilatation or surrounding inflammatory changes. Spleen: Normal in size without focal abnormality. Adrenals/Urinary Tract: Adrenal glands are normal. No hydronephrosis. Cyst mid to lower right kidney. Bladder unremarkable Stomach/Bowel: Stomach is nonenlarged. No dilated small bowel. Diffuse diverticular  disease of the colon. Focal wall thickening at the sigmoid colon with moderate surrounding inflammatory change. No extraluminal gas. Vascular/Lymphatic: Nonaneurysmal aorta. Mild atherosclerosis. No significantly enlarged lymph nodes. Reproductive: Status post hysterectomy. No adnexal masses. Other: No free air.  Small free fluid in the pelvis. Musculoskeletal: Postsurgical changes at L4-L5 and L5-S1. Stable lucent lesion and L5. IMPRESSION: 1. Focal wall thickening involving the sigmoid colon with moderate surrounding inflammatory changes, suspect acute diverticulitis given the presence of diffuse diverticular disease of the colon, less likely colitis of infectious or inflammatory etiology. No intramural air. No free air. 2. Small free fluid in the pelvis. Electronically Signed   By: Donavan Foil M.D.   On: 11/28/2017 18:45    Micro Results    Recent Results (from the past 240 hour(s))  Gastrointestinal Panel by PCR , Stool     Status: None   Collection Time: 12/10/17  5:18 PM  Result Value Ref Range Status   Campylobacter species NOT DETECTED NOT DETECTED Final   Plesimonas shigelloides NOT DETECTED NOT DETECTED Final   Salmonella species NOT DETECTED NOT DETECTED Final   Yersinia enterocolitica NOT DETECTED NOT DETECTED Final   Vibrio species NOT DETECTED NOT DETECTED Final   Vibrio cholerae NOT DETECTED NOT DETECTED Final   Enteroaggregative E coli (EAEC) NOT DETECTED NOT DETECTED Final   Enteropathogenic E coli (EPEC) NOT DETECTED NOT DETECTED Final   Enterotoxigenic E coli (ETEC) NOT DETECTED NOT DETECTED Final   Shiga like toxin producing E coli (STEC) NOT DETECTED NOT DETECTED Final   Shigella/Enteroinvasive E coli (EIEC) NOT DETECTED NOT DETECTED Final   Cryptosporidium NOT DETECTED NOT DETECTED Final   Cyclospora cayetanensis  NOT DETECTED NOT DETECTED Final   Entamoeba histolytica NOT DETECTED NOT DETECTED Final   Giardia lamblia NOT DETECTED NOT DETECTED Final   Adenovirus F40/41  NOT DETECTED NOT DETECTED Final   Astrovirus NOT DETECTED NOT DETECTED Final   Norovirus GI/GII NOT DETECTED NOT DETECTED Final   Rotavirus A NOT DETECTED NOT DETECTED Final   Sapovirus (I, II, IV, and V) NOT DETECTED NOT DETECTED Final    Comment: Performed at [facility], [address] [place], [place] [address]       Today   Subjective:   Misty Green today has no headache,no chest abdominal pain,no new weakness tingling or numbness, feels much better wants to go home today.   Objective:   Blood pressure (!) 143/97, pulse 92, temperature 98.6 F (37 C), temperature source Oral, resp. rate 20, height 5\' 3"  (1.6 m), weight 86.6 kg, SpO2 90 %.  No intake or output data in the 24 hours ending 12/17/17 1615  Exam Awake Alert, Oriented x 3, No new F.N deficits, Normal affect [place].AT,PERRAL Supple Neck,No JVD, No cervical lymphadenopathy appriciated.  Symmetrical Chest wall movement, Good air movement bilaterally, CTAB RRR,No Gallops,Rubs or new Murmurs, No Parasternal Heave +ve B.Sounds, Abd Soft, Non tender, No organomegaly appriciated, No rebound -guarding or rigidity. No Cyanosis, Clubbing or edema, No new Rash or bruise  Data Review   CBC w Diff:  Lab Results  Component Value Date   WBC 7.7 12/12/2017   HGB 11.8 (L) 12/12/2017   HCT 39.8 12/12/2017   HCT 55.1 (H) 03/22/2016   PLT 349 12/12/2017   LYMPHOPCT 23 12/12/2017   MONOPCT 10 12/12/2017   EOSPCT 4 12/12/2017   BASOPCT 1 12/12/2017    CMP:  Lab Results  Component Value Date   NA 140 12/13/2017   K 4.7 12/13/2017   CL 95 (L) 12/13/2017   CO2 35 (H) 12/13/2017   BUN 5 (L) 12/13/2017   CREATININE 0.41 (L) 12/13/2017   PROT 5.7 (L) 12/07/2017   ALBUMIN 2.6 (L) 12/07/2017   BILITOT 0.3 12/07/2017   ALKPHOS 52 12/07/2017   AST 52 (H) 12/07/2017   ALT 38 12/07/2017  .   Total Time in preparing paper work, data evaluation and todays exam - 35 minutes  Epifanio Lesches M.D on  12/13/2017 at 4:15 PM    Note: This dictation was prepared with Dragon dictation along with smaller phrase technology. Any transcriptional errors that result from this process are unintentional.

## 2017-12-22 ENCOUNTER — Ambulatory Visit (INDEPENDENT_AMBULATORY_CARE_PROVIDER_SITE_OTHER): Payer: 59 | Admitting: Surgery

## 2017-12-22 ENCOUNTER — Ambulatory Visit: Payer: Self-pay | Admitting: Surgery

## 2017-12-22 ENCOUNTER — Other Ambulatory Visit
Admission: RE | Admit: 2017-12-22 | Discharge: 2017-12-22 | Disposition: A | Discharge status: Home / Self Care | Payer: 59 | Source: Ambulatory Visit | Attending: Surgery | Admitting: Surgery

## 2017-12-22 ENCOUNTER — Encounter: Payer: Self-pay | Admitting: Surgery

## 2017-12-22 ENCOUNTER — Other Ambulatory Visit: Payer: Self-pay

## 2017-12-22 VITALS — BP 165/96 | HR 95 | Temp 98.6°F | Resp 18 | Ht 63.0 in | Wt 189.2 lb

## 2017-12-22 DIAGNOSIS — F418 Other specified anxiety disorders: Secondary | ICD-10-CM | POA: Insufficient documentation

## 2017-12-22 DIAGNOSIS — R1084 Generalized abdominal pain: Secondary | ICD-10-CM

## 2017-12-22 DIAGNOSIS — K5732 Diverticulitis of large intestine without perforation or abscess without bleeding: Secondary | ICD-10-CM | POA: Diagnosis not present

## 2017-12-22 DIAGNOSIS — E669 Obesity, unspecified: Secondary | ICD-10-CM | POA: Insufficient documentation

## 2017-12-22 DIAGNOSIS — Z720 Tobacco use: Secondary | ICD-10-CM | POA: Insufficient documentation

## 2017-12-22 LAB — BASIC METABOLIC PANEL
ANION GAP: 9 (ref 5–15)
BUN: <5 mg/dL — ABNORMAL LOW (ref 6–20)
CALCIUM: 9.3 mg/dL (ref 8.9–10.3)
CO2: 28 mmol/L (ref 22–32)
Chloride: 103 mmol/L (ref 98–111)
Creatinine, Ser: 0.51 mg/dL (ref 0.44–1.00)
GFR calc Af Amer: >60 mL/min (ref >60)
GLUCOSE: 95 mg/dL (ref 70–99)
POTASSIUM: 3.4 mmol/L — AB (ref 3.5–5.1)
Sodium: 140 mmol/L (ref 135–145)

## 2017-12-22 LAB — CBC WITH DIFFERENTIAL/PLATELET
ABS IMMATURE GRANULOCYTES: 0.01 10*3/uL (ref 0.00–0.07)
BASOS PCT: 1 %
Basophils Absolute: 0.1 10*3/uL (ref 0.0–0.1)
EOS ABS: 0.3 10*3/uL (ref 0.0–0.5)
Eosinophils Relative: 4 %
HCT: 45.1 % (ref 36.0–46.0)
Hemoglobin: 13.8 g/dL (ref 12.0–15.0)
IMMATURE GRANULOCYTES: 0 %
Lymphocytes Relative: 29 %
Lymphs Abs: 1.9 10*3/uL (ref 0.7–4.0)
MCH: 30.5 pg (ref 26.0–34.0)
MCHC: 30.6 g/dL (ref 30.0–36.0)
MCV: 99.6 fL (ref 80.0–100.0)
MONOS PCT: 10 %
Monocytes Absolute: 0.7 10*3/uL (ref 0.1–1.0)
NEUTROS ABS: 3.6 10*3/uL (ref 1.7–7.7)
NEUTROS PCT: 56 %
NRBC: 0 % (ref 0.0–0.2)
Platelets: 347 10*3/uL (ref 150–400)
RBC: 4.53 MIL/uL (ref 3.87–5.11)
RDW: 14.1 % (ref 11.5–15.5)
WBC: 6.6 10*3/uL (ref 4.0–10.5)

## 2017-12-22 NOTE — Patient Instructions (Addendum)
We will schedule the CT scan and lab work.  Please go to the [facility] today and have blood work done. We will see you back after the scan to discuss the results.     LOW FIBER DIET  NO FRESH FRUITS AND VEGETABLES NO SALADS NO RED MEAT        Recommend   AVOID! Breads and Grains  White bread Bagels English Muffins Saltine crackers Rolls, buns, biscuits Melba toast, Zwieback Pancakes, Waffles   Whole grain bread Whole grain flower Graham flour Bran, granola, raisins Seeds, nuts, coconut, raw or dried fruits Graham crackers, cornbread  Cereals  Puffed rice, Cheerios Cornflakes, Rice Krispies Special K White rice, refined pasta  Grits, noodles, macaroni   Granola, oatmeal Any cereal with seeds, nuts, dried fruit Whole grain cereal Brown rice, wild rice, barley, bran  Vegetables  Cooked and canned with no seeds Vegetable juice Potatoes and carrots Mushrooms, squash   Raw fresh vegetables Vegetables with seeds, nuts, dried fruit Sauerkraut, winter squash, peas, broccoli, cauliflower, brussel sprouts, kale, swiss chard, cabbage, fried potatoes   Fruits  Fruit juices, orange juice (no pulp) Most cooked or canned fruit, fruit cocktail, canned applesauce, canned peaches and pears   Prune juice Fresh or dried fruit All berries   Meat  Fish, chicken, pork Baked, broiled, boiled, roasted, microwaved    Red meat Fried meat   Dairy  Ice cream, pudding, custard Cream pies, plain cake, cookies Gelatin, smooth peanut butter, honey, jelly Milk shakes, yogurt, eggs 2% or skim milk, mild cheese Cottage cheese, mozzarella, provolone, jack   Jam with seeds, chunky peanut butter Sharp cheese       Docusate capsules What is this medicine? DOCUSATE (doc CUE sayt) is stool softener. It helps prevent constipation and straining or discomfort associated with hard or dry stools. This medicine may be used for other purposes; ask your health care provider or  pharmacist if you have questions. COMMON BRAND NAME(S): Colace, Colace Clear, Correctol, D.O.S., DC, Doc-Q-Lace, DocuLace, Docusoft S, DOK, DOK Extra Strength, Dulcolax, Genasoft, Kao-Tin, Kaopectate Liqui-Gels, Phillips Stool Softener, Stool Softener, Stool Softner DC, Sulfolax, Sur-Q-Lax, Surfak, Uni-Ease What should I tell my health care provider before I take this medicine? They need to know if you have any of these conditions: -nausea or vomiting -severe constipation -stomach pain -sudden change in bowel habit lasting more than 2 weeks -an unusual or allergic reaction to docusate, other medicines, foods, dyes, or preservatives -pregnant or trying to get pregnant -breast-feeding How should I use this medicine? Take this medicine by mouth with a glass of water. Follow the directions on the label. Take your doses at regular intervals. Do not take your medicine more often than directed. Talk to your pediatrician regarding the use of this medicine in children. While this medicine may be prescribed for children as young as 2 years for selected conditions, precautions do apply. Overdosage: If you think you have taken too much of this medicine contact a poison control center or emergency room at once. NOTE: This medicine is only for you. Do not share this medicine with others. What if I miss a dose? If you miss a dose, take it as soon as you can. If it is almost time for your next dose, take only that dose. Do not take double or extra doses. What may interact with this medicine? -mineral oil This list may not describe all possible interactions. Give your health care provider a list of all the medicines, herbs, non-prescription  drugs, or dietary supplements you use. Also tell them if you smoke, drink alcohol, or use illegal drugs. Some items may interact with your medicine. What should I watch for while using this medicine? Do not use for more than one week without advice from your doctor or health  care professional. If your constipation returns, check with your doctor or health care professional. Drink plenty of water while taking this medicine. Drinking water helps decrease constipation. Stop using this medicine and contact your doctor or health care professional if you experience any rectal bleeding or do not have a bowel movement after use. These could be signs of a more serious condition. What side effects may I notice from receiving this medicine? Side effects that you should report to your doctor or health care professional as soon as possible: -allergic reactions like skin rash, itching or hives, swelling of the face, lips, or tongue Side effects that usually do not require medical attention (report to your doctor or health care professional if they continue or are bothersome): -diarrhea -stomach cramps -throat irritation This list may not describe all possible side effects. Call your doctor for medical advice about side effects. You may report side effects to FDA at 1-800-FDA-1088. Where should I keep my medicine? Keep out of the reach of children. Store at room temperature between 15 and 30 degrees C (59 and 86 degrees F). Throw away any unused medicine after the expiration date. NOTE: This sheet is a summary. It may not cover all possible information. If you have questions about this medicine, talk to your doctor, pharmacist, or health care provider.  2018 Elsevier/Gold Standard (2007-04-27 15:56:49)   You are scheduled for a CT at [facility] on 12/29/17 at 4:00 pm. You will arrive at 3:45 pm and have nothing to eat or drink for 4 hour prior. You will need to pick up a prep kit prior to this.

## 2017-12-22 NOTE — Progress Notes (Signed)
Surgical Clinic Progress/Follow-up Note   HPI:  47 y.o. Female presents to clinic for follow-up evaluation s/p recent [facility] admission for sigmoid colonic diverticulitis with severe constipation with fecal impaction. Patient reports she completed her prescribed antibiotics this morning and has been taking several powder medications for constipation, though denies having been taking the Colace that was advised or that the powder is Miralax, stating that it's what she was prescribed when discharged, though review of her discharge instructions does not specify any bowel regimen beyond resuming her pre-hospitalization Colace. She initially reported she's still experiencing been severe LLQ abdominal pain with disturbed bowel function and inability to tolerate any more than broth, but then followed such descriptions with statements that her pain isn't at all as bad as that with which she was recently admitted to [facility], says her pain is easily controlled with ibuprofen (since she finished the narcotic pain medications with which she was discharged despite having been advised they should not be required or prescribed), and says she avoids eating because she is too scared of her diverticulitis recurring to eat. She explains all of her BM's have been loose and frequent, though she denies explosive, foul-smelling, or watery BM's. She requests surgery to remove her diseased colon, stating she can't live through her recent experiences again. She otherwise denies N/V, fever/chills, CP, or SOB, continues to smoke.  Review of Systems:  Constitutional: denies any other weight loss, fever, chills, or sweats  Eyes: denies any other vision changes, history of eye injury  ENT: denies sore throat, hearing problems  Respiratory: denies shortness of breath, wheezing  Cardiovascular: denies chest pain, palpitations  Gastrointestinal: abdominal pain, N/V, and bowel function as per HPI Musculoskeletal: denies any other joint  pains or cramps  Skin: Denies any other rashes or skin discolorations  Neurological: denies any other headache, dizziness, weakness  Psychiatric: denies any other depression, anxiety  All other review of systems: otherwise negative   Vital Signs:  BP (!) 165/96   Pulse 95   Temp 98.6 F (37 C) (Temporal)   Resp 18   Ht 5\' 3"  (1.6 m)   Wt 189 lb 3.2 oz (85.8 kg)   SpO2 91%   BMI 33.52 kg/m    Physical Exam:  Constitutional:  -- Obese body habitus  -- Awake, alert, and oriented x3  Eyes:  -- Pupils equally round and reactive to light  -- No scleral icterus  Ear, nose, throat:  -- No jugular venous distension  -- No nasal drainage, bleeding Pulmonary:  -- No crackles -- Equal breath sounds bilaterally -- Breathing non-labored at rest Cardiovascular:  -- S1, S2 present  -- No pericardial rubs  Gastrointestinal:  -- Soft and non-distended with moderate LLQ > suprapubic abdominal tenderness to palpation, no guarding/rebound tenderness  -- No abdominal masses appreciated, pulsatile or otherwise  Musculoskeletal / Integumentary:  -- Wounds or skin discoloration: None appreciated  -- Extremities: B/L UE and LE FROM, hands and feet warm, no edema  Neurologic:  -- Motor function: intact and symmetric  -- Sensation: intact and symmetric   Laboratory studies:  CBC Latest Ref Rng & Units 12/22/2017 12/12/2017 12/11/2017  WBC 4.0 - 10.5 K/uL 6.6 7.7 6.5  Hemoglobin 12.0 - 15.0 g/dL 13.8 11.8(L) 11.7(L)  Hematocrit 36.0 - 46.0 % 45.1 39.8 39.1  Platelets 150 - 400 K/uL 347 349 326   CMP Latest Ref Rng & Units 12/22/2017 12/13/2017 12/12/2017  Glucose 70 - 99 mg/dL 95 93 108(H)  BUN 6 -  20 mg/dL <5(L) 5(L) 6  Creatinine 0.44 - 1.00 mg/dL 0.51 0.41(L) 0.45  Sodium 135 - 145 mmol/L 140 140 142  Potassium 3.5 - 5.1 mmol/L 3.4(L) 4.7 4.5  Chloride 98 - 111 mmol/L 103 95(L) 98  CO2 22 - 32 mmol/L 28 35(H) 33(H)  Calcium 8.9 - 10.3 mg/dL 9.3 8.8(L) 8.9  Total Protein 6.5 - 8.1  g/dL - - -  Total Bilirubin 0.3 - 1.2 mg/dL - - -  Alkaline Phos 38 - 126 U/L - - -  AST 15 - 41 U/L - - -  ALT 0 - 44 U/L - - -   Imaging: No new pertinent imaging studies available for review at this time   Assessment:  47 y.o. yo Female with a problem list including...  Patient Active Problem List   Diagnosis Date Noted  . Mixed anxiety and depressive disorder 12/22/2017  . Obesity 12/22/2017  . Tobacco user 12/22/2017  . Diverticulitis 12/03/2017  . Spinal stenosis of lumbar region 04/12/2016  . Acute respiratory failure ([place]) 03/22/2016  . COPD exacerbation ([place]) 03/22/2016  . Chronic back pain 03/22/2016  . Opioid type dependence, abuse ([place]) 03/22/2016  . Polycythemia 03/22/2016  . Anxiety 03/22/2016  . OSA on CPAP 03/22/2016  . Closed fracture of distal end of right radius 08/25/2015  . Closed nondisplaced fracture of styloid process of right ulna 08/25/2015    presents to clinic for follow-up evaluation with conflicting complaints and exam, making patient's improvement vs persistent diverticulitis following her antibiotic therapy difficult to assess s/p recent [facility] admission for sigmoid colonic diverticulitis with severe constipation with fecal impaction, complicated by comorbidities including history of until recently chronic opiates abuse/dependence, obesity (BMI 34), COPD, and chronic ongoing tobacco abuse (smoking).  Plan:   - discussed monitoring vs extension of antibiotics vs CT  - will reluctantly repeat patient's CT abdomen and pelvis + CBC, BMP due to uncertainty regarding patient's recovery and risk of under- or over- treatment and consequences accordingly  - adequate hydration, nutrition, and bowel regiment with colace stool softener in particular discussed at length  - prefer overdue colonoscopy 6 weeks after resolution of inflammation prior to any elective colectomy  - return to clinic in 2 weeks, instructed to call office if any questions or concerns  All of  the above recommendations were discussed with the patient and patient's sister, and all of patient's and family's questions were answered to their expressed satisfaction.  -- Marilynne Drivers Rosana Hoes, MD, [place]: [facility] Partnering for exceptional care. Office: [phone]

## 2017-12-23 DIAGNOSIS — J449 Chronic obstructive pulmonary disease, unspecified: Secondary | ICD-10-CM | POA: Diagnosis not present

## 2017-12-27 ENCOUNTER — Ambulatory Visit: Payer: Self-pay | Admitting: Surgery

## 2017-12-29 ENCOUNTER — Ambulatory Visit
Admission: RE | Admit: 2017-12-29 | Discharge: 2017-12-29 | Disposition: A | Discharge status: Home / Self Care | Payer: 59 | Source: Ambulatory Visit | Attending: Surgery | Admitting: Surgery

## 2017-12-29 DIAGNOSIS — K6389 Other specified diseases of intestine: Secondary | ICD-10-CM | POA: Diagnosis not present

## 2017-12-29 DIAGNOSIS — R1084 Generalized abdominal pain: Secondary | ICD-10-CM | POA: Diagnosis not present

## 2017-12-29 DIAGNOSIS — N200 Calculus of kidney: Secondary | ICD-10-CM | POA: Diagnosis not present

## 2017-12-29 MED ORDER — IOPAMIDOL (ISOVUE-300) INJECTION 61%
100.0000 mL | Freq: Once | INTRAVENOUS | Status: AC | PRN
  Administered 2017-12-29: 100 mL via INTRAVENOUS

## 2017-12-30 ENCOUNTER — Telehealth: Payer: Self-pay

## 2017-12-30 MED ORDER — CIPROFLOXACIN HCL 500 MG PO TABS: 500.0000 mg | ORAL_TABLET | Freq: Two times a day (BID) | ORAL | 0 refills | Status: DC

## 2017-12-30 MED ORDER — METRONIDAZOLE 500 MG PO TABS: 500.0000 mg | ORAL_TABLET | Freq: Three times a day (TID) | ORAL | 0 refills | Status: DC

## 2017-12-30 NOTE — Telephone Encounter (Signed)
Cipro 500 mg x 7 days BID Metronidazole 500 mg TID x 7 days  Patient notified to pick up medicine and start today. Patient schedule to see you 01/12/18 @ 10:15 am

## 2017-12-30 NOTE — Telephone Encounter (Signed)
-----   Message from Vickie Epley, MD sent at 12/30/2017  9:52 AM EST ----- Regarding: Antibiotics s/p CT yesterday Hi,  This patient I'd seen recently finally yesterday had the CT we advised, and it shows she has persistent (though somewhat improved) sigmoid diverticulitis. Her antibiotics should be extended by 1 additional week accordingly, and I can follow-up with her in office in ~1 week or so.  Please let me know if any questions. Thank you.           Corene Cornea

## 2018-01-03 ENCOUNTER — Ambulatory Visit: Payer: 59 | Admitting: Surgery

## 2018-01-05 ENCOUNTER — Telehealth: Payer: Self-pay | Admitting: *Deleted

## 2018-01-05 NOTE — Telephone Encounter (Signed)
Patient called and wanted to know is she could get a prescription for nausea.

## 2018-01-05 NOTE — Telephone Encounter (Signed)
I called the patient to let her know Dr Rosana Hoes would not call anything in for nausea. She states she doesn't have fever but a few chills. No vomiting as of yet. She finished the antibiotics today. She will try some ginger ale and monitor her symptoms for now, she is aware there is always an Md on call if her symptoms change or worsen. She states she is trying to avoid going to the ED.

## 2018-01-06 ENCOUNTER — Other Ambulatory Visit: Payer: Self-pay

## 2018-01-06 ENCOUNTER — Inpatient Hospital Stay
Admission: EM | Admit: 2018-01-06 | Discharge: 2018-01-18 | DRG: 330 | Disposition: A | Discharge status: Home / Self Care | Payer: 59 | Attending: Internal Medicine | Admitting: Internal Medicine

## 2018-01-06 ENCOUNTER — Encounter: Payer: Self-pay | Admitting: Emergency Medicine

## 2018-01-06 ENCOUNTER — Emergency Department: Payer: 59

## 2018-01-06 DIAGNOSIS — Z888 Allergy status to other drugs, medicaments and biological substances status: Secondary | ICD-10-CM | POA: Diagnosis not present

## 2018-01-06 DIAGNOSIS — M7989 Other specified soft tissue disorders: Secondary | ICD-10-CM | POA: Diagnosis present

## 2018-01-06 DIAGNOSIS — Z6838 Body mass index (BMI) 38.0-38.9, adult: Secondary | ICD-10-CM

## 2018-01-06 DIAGNOSIS — Z86010 Personal history of colonic polyps: Secondary | ICD-10-CM

## 2018-01-06 DIAGNOSIS — G629 Polyneuropathy, unspecified: Secondary | ICD-10-CM | POA: Diagnosis present

## 2018-01-06 DIAGNOSIS — G8929 Other chronic pain: Secondary | ICD-10-CM | POA: Diagnosis present

## 2018-01-06 DIAGNOSIS — R0602 Shortness of breath: Secondary | ICD-10-CM

## 2018-01-06 DIAGNOSIS — F17210 Nicotine dependence, cigarettes, uncomplicated: Secondary | ICD-10-CM | POA: Diagnosis present

## 2018-01-06 DIAGNOSIS — F329 Major depressive disorder, single episode, unspecified: Secondary | ICD-10-CM | POA: Diagnosis present

## 2018-01-06 DIAGNOSIS — K5732 Diverticulitis of large intestine without perforation or abscess without bleeding: Secondary | ICD-10-CM | POA: Diagnosis present

## 2018-01-06 DIAGNOSIS — K567 Ileus, unspecified: Secondary | ICD-10-CM | POA: Diagnosis not present

## 2018-01-06 DIAGNOSIS — K6389 Other specified diseases of intestine: Secondary | ICD-10-CM | POA: Diagnosis not present

## 2018-01-06 DIAGNOSIS — Z825 Family history of asthma and other chronic lower respiratory diseases: Secondary | ICD-10-CM

## 2018-01-06 DIAGNOSIS — R300 Dysuria: Secondary | ICD-10-CM | POA: Diagnosis not present

## 2018-01-06 DIAGNOSIS — J441 Chronic obstructive pulmonary disease with (acute) exacerbation: Secondary | ICD-10-CM | POA: Diagnosis present

## 2018-01-06 DIAGNOSIS — D62 Acute posthemorrhagic anemia: Secondary | ICD-10-CM | POA: Diagnosis not present

## 2018-01-06 DIAGNOSIS — Z90710 Acquired absence of both cervix and uterus: Secondary | ICD-10-CM | POA: Diagnosis not present

## 2018-01-06 DIAGNOSIS — K660 Peritoneal adhesions (postprocedural) (postinfection): Secondary | ICD-10-CM | POA: Diagnosis present

## 2018-01-06 DIAGNOSIS — E669 Obesity, unspecified: Secondary | ICD-10-CM | POA: Diagnosis present

## 2018-01-06 DIAGNOSIS — Z833 Family history of diabetes mellitus: Secondary | ICD-10-CM | POA: Diagnosis not present

## 2018-01-06 DIAGNOSIS — Z7951 Long term (current) use of inhaled steroids: Secondary | ICD-10-CM | POA: Diagnosis not present

## 2018-01-06 DIAGNOSIS — Z79899 Other long term (current) drug therapy: Secondary | ICD-10-CM

## 2018-01-06 DIAGNOSIS — K5792 Diverticulitis of intestine, part unspecified, without perforation or abscess without bleeding: Secondary | ICD-10-CM | POA: Diagnosis not present

## 2018-01-06 DIAGNOSIS — Z8249 Family history of ischemic heart disease and other diseases of the circulatory system: Secondary | ICD-10-CM | POA: Diagnosis not present

## 2018-01-06 DIAGNOSIS — R062 Wheezing: Secondary | ICD-10-CM

## 2018-01-06 DIAGNOSIS — R109 Unspecified abdominal pain: Secondary | ICD-10-CM | POA: Diagnosis present

## 2018-01-06 LAB — CBC
HCT: 46.2 % — ABNORMAL HIGH (ref 36.0–46.0)
Hemoglobin: 14.3 g/dL (ref 12.0–15.0)
MCH: 30.6 pg (ref 26.0–34.0)
MCHC: 31 g/dL (ref 30.0–36.0)
MCV: 98.9 fL (ref 80.0–100.0)
NRBC: 0 % (ref 0.0–0.2)
PLATELETS: 226 10*3/uL (ref 150–400)
RBC: 4.67 MIL/uL (ref 3.87–5.11)
RDW: 13.1 % (ref 11.5–15.5)
WBC: 7.2 10*3/uL (ref 4.0–10.5)

## 2018-01-06 LAB — URINALYSIS, COMPLETE (UACMP) WITH MICROSCOPIC
Bacteria, UA: NONE SEEN
Bilirubin Urine: NEGATIVE
GLUCOSE, UA: NEGATIVE mg/dL
HGB URINE DIPSTICK: NEGATIVE
Ketones, ur: 5 mg/dL — AB
Nitrite: NEGATIVE
Protein, ur: NEGATIVE mg/dL
Specific Gravity, Urine: 1.009 (ref 1.005–1.030)
pH: 5 (ref 5.0–8.0)

## 2018-01-06 LAB — COMPREHENSIVE METABOLIC PANEL
ALK PHOS: 41 U/L (ref 38–126)
ALT: 17 U/L (ref 0–44)
ANION GAP: 9 (ref 5–15)
AST: 23 U/L (ref 15–41)
Albumin: 4 g/dL (ref 3.5–5.0)
BUN: 6 mg/dL (ref 6–20)
CO2: 29 mmol/L (ref 22–32)
CREATININE: 0.38 mg/dL — AB (ref 0.44–1.00)
Calcium: 9.3 mg/dL (ref 8.9–10.3)
Chloride: 102 mmol/L (ref 98–111)
GFR calc non Af Amer: >60 mL/min (ref >60)
Glucose, Bld: 87 mg/dL (ref 70–99)
Potassium: 4 mmol/L (ref 3.5–5.1)
SODIUM: 140 mmol/L (ref 135–145)
TOTAL PROTEIN: 7.7 g/dL (ref 6.5–8.1)
Total Bilirubin: 0.5 mg/dL (ref 0.3–1.2)

## 2018-01-06 LAB — LIPASE, BLOOD: Lipase: 23 U/L (ref 11–51)

## 2018-01-06 MED ORDER — IPRATROPIUM-ALBUTEROL 0.5-2.5 (3) MG/3ML IN SOLN
RESPIRATORY_TRACT | Status: AC
  Filled 2018-01-06: qty 3

## 2018-01-06 MED ORDER — ADULT MULTIVITAMIN W/MINERALS CH
1.0000 | ORAL_TABLET | Freq: Every day | ORAL | Status: DC
  Administered 2018-01-07 – 2018-01-18 (×11): 1 via ORAL
  Filled 2018-01-06 (×11): qty 1

## 2018-01-06 MED ORDER — ONDANSETRON 4 MG PO TBDP
8.0000 mg | ORAL_TABLET | Freq: Once | ORAL | Status: AC
  Administered 2018-01-06: 8 mg via ORAL
  Filled 2018-01-06: qty 2

## 2018-01-06 MED ORDER — IOPAMIDOL (ISOVUE-300) INJECTION 61%: 30.0000 mL | Freq: Once | INTRAVENOUS | Status: DC | PRN

## 2018-01-06 MED ORDER — PANTOPRAZOLE SODIUM 40 MG IV SOLR
40.0000 mg | Freq: Two times a day (BID) | INTRAVENOUS | Status: DC
  Administered 2018-01-06 – 2018-01-15 (×18): 40 mg via INTRAVENOUS
  Filled 2018-01-06 (×19): qty 40

## 2018-01-06 MED ORDER — PIPERACILLIN-TAZOBACTAM 3.375 G IVPB 30 MIN
3.3750 g | Freq: Once | INTRAVENOUS | Status: AC
  Administered 2018-01-06: 3.375 g via INTRAVENOUS
  Filled 2018-01-06: qty 50

## 2018-01-06 MED ORDER — PIPERACILLIN-TAZOBACTAM 3.375 G IVPB
3.3750 g | Freq: Three times a day (TID) | INTRAVENOUS | Status: AC
  Administered 2018-01-07 – 2018-01-13 (×20): 3.375 g via INTRAVENOUS
  Filled 2018-01-06 (×20): qty 50

## 2018-01-06 MED ORDER — SODIUM CHLORIDE 0.9 % IV SOLN
INTRAVENOUS | Status: DC
  Administered 2018-01-06 – 2018-01-08 (×3): via INTRAVENOUS

## 2018-01-06 MED ORDER — VITAMIN C 500 MG PO TABS
1000.0000 mg | ORAL_TABLET | Freq: Every day | ORAL | Status: DC
  Administered 2018-01-06 – 2018-01-18 (×12): 1000 mg via ORAL
  Filled 2018-01-06 (×12): qty 2

## 2018-01-06 MED ORDER — ENOXAPARIN SODIUM 40 MG/0.4ML ~~LOC~~ SOLN
40.0000 mg | SUBCUTANEOUS | Status: DC
  Administered 2018-01-06 – 2018-01-11 (×6): 40 mg via SUBCUTANEOUS
  Filled 2018-01-06 (×6): qty 0.4

## 2018-01-06 MED ORDER — GABAPENTIN 400 MG PO CAPS: 800.0000 mg | ORAL_CAPSULE | Freq: Three times a day (TID) | ORAL | Status: DC

## 2018-01-06 MED ORDER — ALPRAZOLAM 0.5 MG PO TABS
1.0000 mg | ORAL_TABLET | Freq: Four times a day (QID) | ORAL | Status: DC
  Administered 2018-01-06 – 2018-01-18 (×42): 1 mg via ORAL
  Filled 2018-01-06 (×45): qty 2

## 2018-01-06 MED ORDER — PROMETHAZINE HCL 25 MG/ML IJ SOLN
12.5000 mg | Freq: Once | INTRAMUSCULAR | Status: AC
  Administered 2018-01-06: 12.5 mg via INTRAVENOUS
  Filled 2018-01-06: qty 1

## 2018-01-06 MED ORDER — ACETAMINOPHEN 325 MG PO TABS
650.0000 mg | ORAL_TABLET | Freq: Four times a day (QID) | ORAL | Status: DC | PRN
  Administered 2018-01-07 – 2018-01-17 (×6): 650 mg via ORAL
  Filled 2018-01-06 (×7): qty 2

## 2018-01-06 MED ORDER — ONDANSETRON HCL 4 MG/2ML IJ SOLN
4.0000 mg | Freq: Four times a day (QID) | INTRAMUSCULAR | Status: DC | PRN
  Administered 2018-01-07 – 2018-01-17 (×10): 4 mg via INTRAVENOUS
  Filled 2018-01-06 (×10): qty 2

## 2018-01-06 MED ORDER — ACETAMINOPHEN 650 MG RE SUPP: 650.0000 mg | Freq: Four times a day (QID) | RECTAL | Status: DC | PRN

## 2018-01-06 MED ORDER — ONDANSETRON HCL 4 MG PO TABS
4.0000 mg | ORAL_TABLET | Freq: Four times a day (QID) | ORAL | Status: DC | PRN
  Administered 2018-01-18: 4 mg via ORAL
  Filled 2018-01-06 (×2): qty 1

## 2018-01-06 MED ORDER — GABAPENTIN 400 MG PO CAPS
800.0000 mg | ORAL_CAPSULE | Freq: Three times a day (TID) | ORAL | Status: DC
  Administered 2018-01-06 – 2018-01-18 (×34): 800 mg via ORAL
  Filled 2018-01-06 (×34): qty 2

## 2018-01-06 MED ORDER — MOMETASONE FURO-FORMOTEROL FUM 200-5 MCG/ACT IN AERO
2.0000 | INHALATION_SPRAY | Freq: Two times a day (BID) | RESPIRATORY_TRACT | Status: DC
  Administered 2018-01-06 – 2018-01-18 (×24): 2 via RESPIRATORY_TRACT
  Filled 2018-01-06: qty 8.8

## 2018-01-06 MED ORDER — SODIUM CHLORIDE 0.9 % IV BOLUS
1000.0000 mL | Freq: Once | INTRAVENOUS | Status: AC
  Administered 2018-01-06: 1000 mL via INTRAVENOUS

## 2018-01-06 MED ORDER — ALBUTEROL SULFATE (2.5 MG/3ML) 0.083% IN NEBU
2.5000 mg | INHALATION_SOLUTION | Freq: Four times a day (QID) | RESPIRATORY_TRACT | Status: DC | PRN
  Administered 2018-01-08 – 2018-01-12 (×2): 2.5 mg via RESPIRATORY_TRACT
  Filled 2018-01-06 (×2): qty 3

## 2018-01-06 MED ORDER — FENTANYL CITRATE (PF) 100 MCG/2ML IJ SOLN
50.0000 ug | Freq: Once | INTRAMUSCULAR | Status: AC
  Administered 2018-01-06: 50 ug via INTRAVENOUS
  Filled 2018-01-06: qty 2

## 2018-01-06 MED ORDER — MORPHINE SULFATE (PF) 2 MG/ML IV SOLN
2.0000 mg | INTRAVENOUS | Status: DC | PRN
  Administered 2018-01-06 – 2018-01-07 (×4): 2 mg via INTRAVENOUS
  Filled 2018-01-06 (×4): qty 1

## 2018-01-06 MED ORDER — IPRATROPIUM-ALBUTEROL 0.5-2.5 (3) MG/3ML IN SOLN
3.0000 mL | Freq: Once | RESPIRATORY_TRACT | Status: AC
  Administered 2018-01-06: 3 mL via RESPIRATORY_TRACT

## 2018-01-06 MED ORDER — ZOLPIDEM TARTRATE 5 MG PO TABS
5.0000 mg | ORAL_TABLET | Freq: Every evening | ORAL | Status: DC | PRN
  Administered 2018-01-06 – 2018-01-17 (×9): 5 mg via ORAL
  Filled 2018-01-06 (×9): qty 1

## 2018-01-06 MED ORDER — TIOTROPIUM BROMIDE MONOHYDRATE 18 MCG IN CAPS
18.0000 ug | ORAL_CAPSULE | Freq: Every day | RESPIRATORY_TRACT | Status: DC
  Administered 2018-01-07 – 2018-01-18 (×12): 18 ug via RESPIRATORY_TRACT
  Filled 2018-01-06 (×3): qty 5

## 2018-01-06 MED ORDER — ROFLUMILAST 500 MCG PO TABS
500.0000 ug | ORAL_TABLET | Freq: Every day | ORAL | Status: DC
  Administered 2018-01-07 – 2018-01-18 (×11): 500 ug via ORAL
  Filled 2018-01-06 (×12): qty 1

## 2018-01-06 NOTE — H&P (Addendum)
[facility] [place] NAME: Misty Green    MR#:  [id number]  DATE OF BIRTH:  27-Oct-1970  DATE OF ADMISSION:  01/06/2018  PRIMARY CARE PHYSICIAN: Sinda Du, MD   REQUESTING/REFERRING PHYSICIAN: Schuyler Amor, MD  CHIEF COMPLAINT:   Chief Complaint  Patient presents with  . Abdominal Pain    HISTORY OF PRESENT ILLNESS: Misty Green  is a 47 y.o. female with a known history of COPD,, nicotine abuse, with abdominal pain ongoing past 2 months according to her.  Patient has had multiple doses of antibiotics for diverticulitis.  She has been followed up by surgery as outpatient who presents back with complaint of left lower quadrant abdominal pain.  States that she just finished a course of antibiotics yesterday.  Patient has had 5 CT scans within the past 2 months.    PAST MEDICAL HISTORY:   Past Medical History:  Diagnosis Date  . COPD (chronic obstructive pulmonary disease) ([place])   . Current smoker   . Opiate abuse, continuous ([place])     PAST SURGICAL HISTORY:  Past Surgical History:  Procedure Laterality Date  . ABDOMINAL HYSTERECTOMY    . BACK SURGERY    . cesction    . INCISIONAL HERNIA REPAIR     lower midline laparotomy incision (hysterectomy), repaired with mesh  . LAPAROSCOPIC CHOLECYSTECTOMY  2012  . ORIF WRIST FRACTURE Right 08/25/2015   Procedure: OPEN REDUCTION INTERNAL FIXATION (ORIF) WRIST FRACTURE;  Surgeon: Corky Mull, MD;  Location: [facility] ORS;  Service: Orthopedics;  Laterality: Right;    SOCIAL HISTORY:  Social History   Tobacco Use  . Smoking status: Current Every Day Smoker    Packs/day: 1.00    Years: 15.00    Pack years: 15.00    Types: Cigarettes  . Smokeless tobacco: Never Used  Substance Use Topics  . Alcohol use: No    FAMILY HISTORY:  Family History  Problem Relation Age of Onset  . COPD Mother   . Hypertension Father   . Diabetes Father     DRUG ALLERGIES:  Allergies   Allergen Reactions  . Other Nausea And Vomiting    Anti-depressent that starts with t    REVIEW OF SYSTEMS:   CONSTITUTIONAL: No fever, fatigue or weakness.  EYES: No blurred or double vision.  EARS, NOSE, AND THROAT: No tinnitus or ear pain.  RESPIRATORY: No cough, shortness of breath, wheezing or hemoptysis.  CARDIOVASCULAR: No chest pain, orthopnea, edema.  GASTROINTESTINAL: Positive nausea, vomiting, diarrhea or positive abdominal pain.  GENITOURINARY: No dysuria, hematuria.  ENDOCRINE: No polyuria, nocturia,  HEMATOLOGY: No anemia, easy bruising or bleeding SKIN: No rash or lesion. MUSCULOSKELETAL: No joint pain or arthritis.   NEUROLOGIC: No tingling, numbness, weakness.  PSYCHIATRY: No anxiety or depression.   MEDICATIONS AT HOME:  Prior to Admission medications   Medication Sig Start Date End Date Taking? Authorizing Provider  ALPRAZolam Duanne Moron) 1 MG tablet Take 1 mg by mouth 4 (four) times daily.   Yes [provider]  Fluticasone-Salmeterol (ADVAIR) 250-50 MCG/DOSE AEPB Inhale 1 puff into the lungs 2 (two) times daily.   Yes [provider]  furosemide (LASIX) 40 MG tablet Take 40 mg by mouth daily as needed for fluid.    Yes [provider]  gabapentin (NEURONTIN) 300 MG capsule Take 800 mg by mouth 3 (three) times daily.    Yes [provider]  Multiple Vitamins-Minerals (MULTIVITAMIN WITH MINERALS) tablet Take 1 tablet  by mouth daily.   Yes [provider]  roflumilast (DALIRESP) 500 MCG TABS tablet Take 500 mcg by mouth daily.   Yes [provider]  tiotropium (SPIRIVA) 18 MCG inhalation capsule Place 18 mcg into inhaler and inhale daily.   Yes [provider]  zolpidem (AMBIEN) 10 MG tablet Take 10 mg by mouth at bedtime as needed for sleep.   Yes [provider]  albuterol (PROVENTIL HFA;VENTOLIN HFA) 108 (90 Base) MCG/ACT inhaler Inhale into the lungs every 6 (six) hours as needed for wheezing  or shortness of breath.    [provider]  Ascorbic Acid (VITAMIN C) 1000 MG tablet Take 1,000 mg by mouth daily.    [provider]      PHYSICAL EXAMINATION:   VITAL SIGNS: Blood pressure 131/77, pulse 66, temperature 98.1 F (36.7 C), temperature source Oral, resp. rate 20, height 5\' 3"  (1.6 m), weight 83 kg, SpO2 95 %.  GENERAL:  47 y.o.-year-old patient lying in the bed with no acute distress.  EYES: Pupils equal, round, reactive to light and accommodation. No scleral icterus. Extraocular muscles intact.  HEENT: Head atraumatic, normocephalic. Oropharynx and nasopharynx clear.  NECK:  Supple, no jugular venous distention. No thyroid enlargement, no tenderness.  LUNGS: Normal breath sounds bilaterally, no wheezing, rales,rhonchi or crepitation. No use of accessory muscles of respiration.  CARDIOVASCULAR: S1, S2 normal. No murmurs, rubs, or gallops.  ABDOMEN: Soft, left lower quadrant tenderness, nondistended. Bowel sounds present. No organomegaly or mass.  EXTREMITIES: No pedal edema, cyanosis, or clubbing.  NEUROLOGIC: Cranial nerves II through XII are intact. Muscle strength 5/5 in all extremities. Sensation intact. Gait not checked.  PSYCHIATRIC: The patient is alert and oriented x 3.  SKIN: No obvious rash, lesion, or ulcer.   LABORATORY PANEL:   CBC Recent Labs  Lab 01/06/18 1422  WBC 7.2  HGB 14.3  HCT 46.2*  PLT 226  MCV 98.9  MCH 30.6  MCHC 31.0  RDW 13.1   ------------------------------------------------------------------------------------------------------------------  Chemistries  Recent Labs  Lab 01/06/18 1422  NA 140  K 4.0  CL 102  CO2 29  GLUCOSE 87  BUN 6  CREATININE 0.38*  CALCIUM 9.3  AST 23  ALT 17  ALKPHOS 41  BILITOT 0.5   ------------------------------------------------------------------------------------------------------------------ estimated creatinine clearance is 87.7 mL/min (A) (by C-G formula based on SCr of  0.38 mg/dL (L)). ------------------------------------------------------------------------------------------------------------------ No results for input(s): TSH, T4TOTAL, T3FREE, THYROIDAB in the last 72 hours.  Invalid input(s): FREET3   Coagulation profile No results for input(s): INR, PROTIME in the last 168 hours. ------------------------------------------------------------------------------------------------------------------- No results for input(s): DDIMER in the last 72 hours. -------------------------------------------------------------------------------------------------------------------  Cardiac Enzymes No results for input(s): CKMB, TROPONINI, MYOGLOBIN in the last 168 hours.  Invalid input(s): CK ------------------------------------------------------------------------------------------------------------------ Invalid input(s): POCBNP  ---------------------------------------------------------------------------------------------------------------  Urinalysis    Component Value Date/Time   COLORURINE YELLOW (A) 01/06/2018 1527   APPEARANCEUR CLEAR (A) 01/06/2018 1527   LABSPEC 1.009 01/06/2018 1527   PHURINE 5.0 01/06/2018 1527   GLUCOSEU NEGATIVE 01/06/2018 1527   HGBUR NEGATIVE 01/06/2018 1527   BILIRUBINUR NEGATIVE 01/06/2018 1527   KETONESUR 5 (A) 01/06/2018 1527   PROTEINUR NEGATIVE 01/06/2018 1527   NITRITE NEGATIVE 01/06/2018 1527   LEUKOCYTESUR TRACE (A) 01/06/2018 1527     RADIOLOGY: Dg Abdomen Acute W/chest  Result Date: 01/06/2018 CLINICAL DATA:  Abdominal pain. Recurrent and persistent diverticulitis. EXAM: DG ABDOMEN ACUTE W/ 1V CHEST COMPARISON:  12/29/2017 FINDINGS: Gas is seen throughout the small and large bowel  loops up to the level of the rectum. Within the left abdomen there are several dilated loops of small bowel which measure up to 3.7 cm. Cholecystectomy clips noted in the right upper quadrant of the abdomen. Normal heart size. No pleural  effusion or edema. Diffuse coarsened interstitial markings identified in both lungs. Multiple calcified granulomas identified bilaterally. IMPRESSION: 1. Interval dilatation of small-bowel loops within the left abdomen. This may reflect focal small bowel ileus versus partial obstruction. Electronically Signed   By: Kerby Moors M.D.   On: 01/06/2018 16:55    EKG: Orders placed or performed during the hospital encounter of 03/21/16  . EKG 12-Lead  . EKG 12-Lead  . EKG 12-Lead  . EKG 12-Lead  . EKG 12-Lead    IMPRESSION AND PLAN: Patient is a 47 year old white female with recurrent abdominal pain with diverticulitis  1.  Acute diverticulitis recurrent in nature Due to patient already having 5 CT scans another CT scan is not repeated Patient's abdominal x-ray shows no perforation ED physician has spoken to Dr. Celine Ahr of surgery who will evaluate the patient for any further recommendations. We will continue Zosyn for now Recent CT also showed gastric inflammation I will give her IV Protonix twice daily  2.  Nicotine abuse smoking cessation provided 4 minutes spent strongly recommend patient stop smoking  3.  Miscellaneous Lovenox for DVT prophylax  All the records are reviewed and case discussed with ED provider. Management plans discussed with the patient, family and they are in agreement.  CODE STATUS: Code Status History    Date Active Date Inactive Code Status Order ID Comments User Context   12/03/2017 1840 12/13/2017 1740 Full Code [id number]  Henreitta Leber, MD Inpatient   03/22/2016 0059 03/24/2016 1705 Full Code [id number]  Norval Morton, MD ED       TOTAL TIME TAKING CARE OF THIS PATIENT: 31minutes.    Dustin Flock M.D on 01/06/2018 at 6:30 PM  Between 7am to 6pm - Pager - [phone]  After 6pm go to [email or web address] - password [email or web address] [facility]  [phone]  CC: Primary care physician; Sinda Du, MD

## 2018-01-06 NOTE — Progress Notes (Signed)
Patient has home cpap. Unit intact. Has already been assembled by patient. She states is functioning properly. o2 connected with 2 liter bleed in. No rips or tears noted in circuit or mask.

## 2018-01-06 NOTE — ED Provider Notes (Signed)
[facility] Emergency Department Provider Note  ____________________________________________   I have reviewed the triage vital signs and the nursing notes. Where available I have reviewed prior notes and, if possible and indicated, outside hospital notes.    HISTORY  Chief Complaint Abdominal Pain    HPI HELMA ARGYLE is a 47 y.o. female presents today complaining of abdominal pain.  Patient does have a history of opiate use, and recurrent and persistent diverticulitis since the 11th of last month.  That she has had 5 different CT scans done in the last 6 weeks which showed perforation all of which showed diverticular pathology.  She was most recently seen and imaged by surgery on the 12th of this month.  She was given a prescription for Cipro and Flagyl which she took to completion.  She states that her left lower quadrant pain is now worse again.  Got gradually worse since yesterday.  No fever.  Has had some diarrhea.  No hematemesis or significant bleeding.  She states that the pain is significant, and is consistent with prior diverticular pathology.     Past Medical History:  Diagnosis Date  . COPD (chronic obstructive pulmonary disease) ([place])   . Current smoker   . Opiate abuse, continuous [facility])     Patient Active Problem List   Diagnosis Date Noted  . Mixed anxiety and depressive disorder 12/22/2017  . Obesity 12/22/2017  . Tobacco user 12/22/2017  . Diverticulitis 12/03/2017  . Spinal stenosis of lumbar region 04/12/2016  . Acute respiratory failure ([place]) 03/22/2016  . COPD exacerbation ([place]) 03/22/2016  . Chronic back pain 03/22/2016  . Opioid type dependence, abuse ([place]) 03/22/2016  . Polycythemia 03/22/2016  . Anxiety 03/22/2016  . OSA on CPAP 03/22/2016  . Closed fracture of distal end of right radius 08/25/2015  . Closed nondisplaced fracture of styloid process of right ulna 08/25/2015    Past Surgical History:  Procedure  Laterality Date  . ABDOMINAL HYSTERECTOMY    . BACK SURGERY    . cesction    . INCISIONAL HERNIA REPAIR     lower midline laparotomy incision (hysterectomy), repaired with mesh  . LAPAROSCOPIC CHOLECYSTECTOMY  2012  . ORIF WRIST FRACTURE Right 08/25/2015   Procedure: OPEN REDUCTION INTERNAL FIXATION (ORIF) WRIST FRACTURE;  Surgeon: Corky Mull, MD;  Location: [facility] ORS;  Service: Orthopedics;  Laterality: Right;    Prior to Admission medications   Medication Sig Start Date End Date Taking? Authorizing Provider  albuterol (PROVENTIL HFA;VENTOLIN HFA) 108 (90 Base) MCG/ACT inhaler Inhale into the lungs every 6 (six) hours as needed for wheezing or shortness of breath.    [provider]  ALPRAZolam Duanne Moron) 1 MG tablet Take 1 mg by mouth 4 (four) times daily.    [provider]  Ascorbic Acid (VITAMIN C) 1000 MG tablet Take 1,000 mg by mouth daily.    [provider]  ciprofloxacin (CIPRO) 500 MG tablet Take 1 tablet (500 mg total) by mouth 2 (two) times daily for 7 days. 12/30/17 01/06/18  Vickie Epley, MD  Fluticasone-Salmeterol (ADVAIR) 250-50 MCG/DOSE AEPB Inhale 1 puff into the lungs 2 (two) times daily.    [provider]  furosemide (LASIX) 40 MG tablet Take 40 mg by mouth daily as needed for fluid.     [provider]  gabapentin (NEURONTIN) 300 MG capsule Take 300 mg by mouth 3 (three) times daily.    [provider]  metroNIDAZOLE (FLAGYL) 500 MG tablet Take  1 tablet (500 mg total) by mouth 3 (three) times daily for 7 days. 12/30/17 01/06/18  Vickie Epley, MD  Multiple Vitamins-Minerals (MULTIVITAMIN WITH MINERALS) tablet Take 1 tablet by mouth daily.    [provider]  roflumilast (DALIRESP) 500 MCG TABS tablet Take 500 mcg by mouth daily.    [provider]  tiotropium (SPIRIVA) 18 MCG inhalation capsule Place 18 mcg into inhaler and inhale daily.    [provider]  zolpidem (AMBIEN) 10 MG  tablet Take 10 mg by mouth at bedtime as needed for sleep.    [provider]    Allergies Other  Family History  Problem Relation Age of Onset  . COPD Mother   . Hypertension Father   . Diabetes Father     Social History Social History   Tobacco Use  . Smoking status: Current Every Day Smoker    Packs/day: 1.00    Years: 15.00    Pack years: 15.00    Types: Cigarettes  . Smokeless tobacco: Never Used  Substance Use Topics  . Alcohol use: No  . Drug use: Yes    Types: Marijuana    Comment: Yesterday    Review of Systems Constitutional: No fever/chills Eyes: No visual changes. ENT: No sore throat. No stiff neck no neck pain Cardiovascular: Denies chest pain. Respiratory: Denies shortness of breath. Gastrointestinal:   no vomiting.  + diarrhea.  No constipation. Genitourinary: Negative for dysuria. Musculoskeletal: Negative lower extremity swelling Skin: Negative for rash. Neurological: Negative for severe headaches, focal weakness or numbness.   ____________________________________________   PHYSICAL EXAM:  VITAL SIGNS: ED Triage Vitals  Enc Vitals Group     BP 01/06/18 1354 135/83     Pulse Rate 01/06/18 1354 77     Resp 01/06/18 1354 18     Temp 01/06/18 1354 98.1 F (36.7 C)     Temp Source 01/06/18 1354 Oral     SpO2 01/06/18 1354 (!) 89 %     Weight 01/06/18 1355 183 lb (83 kg)     Height 01/06/18 1355 5\' 3"  (1.6 m)     Head Circumference --      Peak Flow --      Pain Score 01/06/18 1400 8     Pain Loc --      Pain Edu? --      Excl. in [place]? --     Constitutional: Alert and oriented. Well appearing and in no acute distress. Eyes: Conjunctivae are normal Head: Atraumatic HEENT: No congestion/rhinnorhea. Mucous membranes are moist.  Oropharynx non-erythematous Neck:   Nontender with no meningismus, no masses, no stridor Cardiovascular: Normal rate, regular rhythm. Grossly normal heart sounds.  Good peripheral  circulation. Respiratory: Normal respiratory effort.  No retractions. Lungs CTAB. Abdominal: Soft and left lower quadrant tenderness noted with no guarding no rebound. No distention. No guarding no rebound Back:  There is no focal tenderness or step off.  there is no midline tenderness there are no lesions noted. there is no CVA tenderness  Musculoskeletal: No lower extremity tenderness, no upper extremity tenderness. No joint effusions, no DVT signs strong distal pulses no edema Neurologic:  Normal speech and language. No gross focal neurologic deficits are appreciated.  Skin:  Skin is warm, dry and intact. No rash noted. Psychiatric: Mood and affect are normal. Speech and behavior are normal.  ____________________________________________   LABS (all labs ordered are listed, but only abnormal results are displayed)  Labs Reviewed  COMPREHENSIVE METABOLIC  PANEL - Abnormal; Notable for the following components:      Result Value   Creatinine, Ser 0.38 (*)    All other components within normal limits  CBC - Abnormal; Notable for the following components:   HCT 46.2 (*)    All other components within normal limits  URINALYSIS, COMPLETE (UACMP) WITH MICROSCOPIC - Abnormal; Notable for the following components:   Color, Urine YELLOW (*)    APPearance CLEAR (*)    Ketones, ur 5 (*)    Leukocytes, UA TRACE (*)    All other components within normal limits  LIPASE, BLOOD    Pertinent labs  results that were available during my care of the patient were reviewed by me and considered in my medical decision making (see chart for details). ____________________________________________  EKG  I personally interpreted any EKGs ordered by me or triage  ____________________________________________  RADIOLOGY  Pertinent labs & imaging results that were available during my care of the patient were reviewed by me and considered in my medical decision making (see chart for details). If possible,  patient and/or family made aware of any abnormal findings.  No results found. ____________________________________________    PROCEDURES  Procedure(s) performed: None  Procedures  Critical Care performed: None  ____________________________________________   INITIAL IMPRESSION / ASSESSMENT AND PLAN / ED COURSE  Pertinent labs & imaging results that were available during my care of the patient were reviewed by me and considered in my medical decision making (see chart for details).  Here with chronic recurrent diverticulitis which has been persistent now for the last week and a half.  I did discuss with Dr. Celine Ahr very much appreciate surgical consult.  They are following her for this.  I do not think this time she has peritoneal signs.  I am very reluctant to do 6 different CT scans on this patient in the last 5 or 6 weeks for the same problem and I think the radiation risk I weigh the benefit.  I have low suspicion for perforation at this time.  However, her pain is significant, and apparently worsening, and I think given this if we are not going to be able to scan her and she is already failed outpatient antibiotic she would need to be admitted for diverticular treatment with antibiotics, and serial abdominal exams.  Surgery is happy to follow along but given that there is no identified surgical pathology at this time we would like her to be admitted to medicine which we will instrument.  Very much appreciate surgical consult.    ____________________________________________   FINAL CLINICAL IMPRESSION(S) / ED DIAGNOSES  Final diagnoses:  Diverticulitis      This chart was dictated using voice recognition software.  Despite best efforts to proofread,  errors can occur which can change meaning.      Schuyler Amor, MD 01/06/18 [phone]

## 2018-01-06 NOTE — ED Triage Notes (Signed)
Pt to ed with c/o left lower abd pain, recent dx of diverticulitis.  Finished abx yesterday.  Reports this has been going on since November without relief.  Pt tearful at triage.

## 2018-01-06 NOTE — ED Notes (Signed)
Report given to Rachel RN

## 2018-01-06 NOTE — Consult Note (Signed)
Pharmacy Antibiotic Note  Misty Green is a 47 y.o. female admitted on 01/06/2018 with an intra-abdominal infection.  Pharmacy has been consulted for Zosyn dosing. She has had multiple rounds of antibiotics for diverticulitis, most recently oral Cipro and Flagyl which she just completed.  Plan: Zosyn 3.375g IV q8h (4 hour infusion).  Height: 5\' 3"  (160 cm) Weight: 183 lb (83 kg) IBW/kg (Calculated) : 52.4  Temp (24hrs), Avg:98.1 F (36.7 C), Min:98.1 F (36.7 C), Max:98.1 F (36.7 C)  Recent Labs  Lab 01/06/18 1422  WBC 7.2  CREATININE 0.38*    Estimated Creatinine Clearance: 87.7 mL/min (A) (by C-G formula based on SCr of 0.38 mg/dL (L)).    Antimicrobials this admission: Zosyn 12/20 >>   Microbiology results: 12/20 UCx: pending   Thank you for allowing pharmacy to be a part of this patient's care.  Dallie Piles, PharmD 01/06/2018 6:47 PM

## 2018-01-07 LAB — BASIC METABOLIC PANEL
Anion gap: 4 — ABNORMAL LOW (ref 5–15)
BUN: 6 mg/dL (ref 6–20)
CO2: 32 mmol/L (ref 22–32)
Calcium: 8.3 mg/dL — ABNORMAL LOW (ref 8.9–10.3)
Chloride: 105 mmol/L (ref 98–111)
Creatinine, Ser: 0.49 mg/dL (ref 0.44–1.00)
GFR calc Af Amer: >60 mL/min (ref >60)
GFR calc non Af Amer: >60 mL/min (ref >60)
Glucose, Bld: 92 mg/dL (ref 70–99)
Potassium: 3.8 mmol/L (ref 3.5–5.1)
Sodium: 141 mmol/L (ref 135–145)

## 2018-01-07 LAB — CBC
HCT: 41.4 % (ref 36.0–46.0)
HEMOGLOBIN: 12.4 g/dL (ref 12.0–15.0)
MCH: 30.6 pg (ref 26.0–34.0)
MCHC: 30 g/dL (ref 30.0–36.0)
MCV: 102.2 fL — ABNORMAL HIGH (ref 80.0–100.0)
Platelets: 179 10*3/uL (ref 150–400)
RBC: 4.05 MIL/uL (ref 3.87–5.11)
RDW: 13 % (ref 11.5–15.5)
WBC: 4.7 10*3/uL (ref 4.0–10.5)
nRBC: 0 % (ref 0.0–0.2)

## 2018-01-07 MED ORDER — SIMETHICONE 80 MG PO CHEW
80.0000 mg | CHEWABLE_TABLET | Freq: Four times a day (QID) | ORAL | Status: DC | PRN
  Administered 2018-01-07: 80 mg via ORAL
  Filled 2018-01-07 (×2): qty 1

## 2018-01-07 MED ORDER — POLYETHYLENE GLYCOL 3350 17 G PO PACK
1.0000 | PACK | Freq: Every day | ORAL | Status: DC | PRN
  Administered 2018-01-07: 17 g via ORAL
  Filled 2018-01-07: qty 1

## 2018-01-07 MED ORDER — MORPHINE SULFATE (PF) 4 MG/ML IV SOLN
4.0000 mg | INTRAVENOUS | Status: DC | PRN
  Administered 2018-01-07 – 2018-01-08 (×6): 4 mg via INTRAVENOUS
  Filled 2018-01-07 (×6): qty 1

## 2018-01-07 MED ORDER — SODIUM CHLORIDE 0.9 % IV SOLN
INTRAVENOUS | Status: DC | PRN
  Administered 2018-01-07 – 2018-01-16 (×6): 250 mL via INTRAVENOUS

## 2018-01-07 MED ORDER — FLUCONAZOLE 100MG IVPB
100.0000 mg | INTRAVENOUS | Status: DC
  Administered 2018-01-07 – 2018-01-11 (×5): 100 mg via INTRAVENOUS
  Filled 2018-01-07 (×5): qty 50

## 2018-01-07 NOTE — Consult Note (Signed)
Reason for Consult:recurrent diverticulitis Referring Physician: Burlene Arnt, Emergency Medicine  Misty Green is an 47 y.o. female.  HPI: The patient is well-known to the general surgery service.  She has been followed by Dr. Tama High.  She has acute diverticulitis that does not seem to be responding to antibiotics.  She has had multiple CT scans since her first presentation, all of which show persistent inflammation in the area of the sigmoid colon.  She saw Dr. Rosana Hoes on December 5.  She subsequently had a CT scan on the 12th that showed persistent acute inflammation.  He placed her on a course of ciprofloxacin and Flagyl.  He presented to the emergency department with recurrent abdominal pain.  Due to the significant number of CT scans that have been performed, it was felt that additional CT imaging would not be beneficial.  Plain films were obtained that did not show any evidence of free air.  The patient was admitted to the hospitalist service, with general surgery consulted.  Today she is tearful and complaining of left lower quadrant pain.  She states that she does not understand why her body is not responding to usual treatments.  Past Medical History:  Diagnosis Date  . COPD (chronic obstructive pulmonary disease) ([place])   . Current smoker   . Opiate abuse, continuous ([place])     Past Surgical History:  Procedure Laterality Date  . ABDOMINAL HYSTERECTOMY    . BACK SURGERY    . cesction    . INCISIONAL HERNIA REPAIR     lower midline laparotomy incision (hysterectomy), repaired with mesh  . LAPAROSCOPIC CHOLECYSTECTOMY  2012  . ORIF WRIST FRACTURE Right 08/25/2015   Procedure: OPEN REDUCTION INTERNAL FIXATION (ORIF) WRIST FRACTURE;  Surgeon: Corky Mull, MD;  Location: [facility] ORS;  Service: Orthopedics;  Laterality: Right;    Family History  Problem Relation Age of Onset  . COPD Mother   . Hypertension Father   . Diabetes Father     Social History:  reports that she has been  smoking cigarettes. She has a 15.00 pack-year smoking history. She has never used smokeless tobacco. She reports current drug use. Drug: Marijuana. She reports that she does not drink alcohol.  Allergies:  Allergies  Allergen Reactions  . Other Nausea And Vomiting    Anti-depressent that starts with t    Medications: I have reviewed the patient's current medications.  Results for orders placed or performed during the hospital encounter of 01/06/18 (from the past 48 hour(s))  Lipase, blood     Status: None   Collection Time: 01/06/18  2:22 PM  Result Value Ref Range   Lipase 23 11 - 51 U/L    Comment: Performed at [facility], [address] [place], [place] [address]  Comprehensive metabolic panel     Status: Abnormal   Collection Time: 01/06/18  2:22 PM  Result Value Ref Range   Sodium 140 135 - 145 mmol/L   Potassium 4.0 3.5 - 5.1 mmol/L   Chloride 102 98 - 111 mmol/L   CO2 29 22 - 32 mmol/L   Glucose, Bld 87 70 - 99 mg/dL   BUN 6 6 - 20 mg/dL   Creatinine, Ser 0.38 (L) 0.44 - 1.00 mg/dL   Calcium 9.3 8.9 - 10.3 mg/dL   Total Protein 7.7 6.5 - 8.1 g/dL   Albumin 4.0 3.5 - 5.0 g/dL   AST 23 15 - 41 U/L   ALT 17 0 - 44 U/L  Alkaline Phosphatase 41 38 - 126 U/L   Total Bilirubin 0.5 0.3 - 1.2 mg/dL   GFR calc non Af Amer >60 >60 mL/min   GFR calc Af Amer >60 >60 mL/min   Anion gap 9 5 - 15    Comment: Performed at [facility], [address] [place], [place] [address]  CBC     Status: Abnormal   Collection Time: 01/06/18  2:22 PM  Result Value Ref Range   WBC 7.2 4.0 - 10.5 K/uL   RBC 4.67 3.87 - 5.11 MIL/uL   Hemoglobin 14.3 12.0 - 15.0 g/dL   HCT 46.2 (H) 36.0 - 46.0 %   MCV 98.9 80.0 - 100.0 fL   MCH 30.6 26.0 - 34.0 pg   MCHC 31.0 30.0 - 36.0 g/dL   RDW 13.1 11.5 - 15.5 %   Platelets 226 150 - 400 K/uL   nRBC 0.0 0.0 - 0.2 %    Comment: Performed at [facility], [address] [place], [place] [address]  Urinalysis, Complete  w Microscopic     Status: Abnormal   Collection Time: 01/06/18  3:27 PM  Result Value Ref Range   Color, Urine YELLOW (A) YELLOW   APPearance CLEAR (A) CLEAR   Specific Gravity, Urine 1.009 1.005 - 1.030   pH 5.0 5.0 - 8.0   Glucose, UA NEGATIVE NEGATIVE mg/dL   Hgb urine dipstick NEGATIVE NEGATIVE   Bilirubin Urine NEGATIVE NEGATIVE   Ketones, ur 5 (A) NEGATIVE mg/dL   Protein, ur NEGATIVE NEGATIVE mg/dL   Nitrite NEGATIVE NEGATIVE   Leukocytes, UA TRACE (A) NEGATIVE   WBC, UA 0-5 0 - 5 WBC/hpf   Bacteria, UA NONE SEEN NONE SEEN   Squamous Epithelial / LPF 0-5 0 - 5   Mucus PRESENT     Comment: Performed at [facility], [address] [place], [place] [address]  CBC     Status: Abnormal   Collection Time: 01/07/18  5:55 AM  Result Value Ref Range   WBC 4.7 4.0 - 10.5 K/uL   RBC 4.05 3.87 - 5.11 MIL/uL   Hemoglobin 12.4 12.0 - 15.0 g/dL   HCT 41.4 36.0 - 46.0 %   MCV 102.2 (H) 80.0 - 100.0 fL   MCH 30.6 26.0 - 34.0 pg   MCHC 30.0 30.0 - 36.0 g/dL   RDW 13.0 11.5 - 15.5 %   Platelets 179 150 - 400 K/uL   nRBC 0.0 0.0 - 0.2 %    Comment: Performed at [facility], [address] [place], [place] [address]  Basic metabolic panel     Status: Abnormal   Collection Time: 01/07/18  5:55 AM  Result Value Ref Range   Sodium 141 135 - 145 mmol/L   Potassium 3.8 3.5 - 5.1 mmol/L   Chloride 105 98 - 111 mmol/L   CO2 32 22 - 32 mmol/L   Glucose, Bld 92 70 - 99 mg/dL   BUN 6 6 - 20 mg/dL   Creatinine, Ser 0.49 0.44 - 1.00 mg/dL   Calcium 8.3 (L) 8.9 - 10.3 mg/dL   GFR calc non Af Amer >60 >60 mL/min   GFR calc Af Amer >60 >60 mL/min   Anion gap 4 (L) 5 - 15    Comment: Performed at [facility], [address] [place], [place] [address]    Dg Abdomen Acute W/chest  Result Date: 01/06/2018 CLINICAL DATA:  Abdominal pain. Recurrent and persistent diverticulitis. EXAM: DG  ABDOMEN ACUTE W/ 1V CHEST COMPARISON:  12/29/2017 FINDINGS: Gas is seen  throughout the small and large bowel loops up to the level of the rectum. Within the left abdomen there are several dilated loops of small bowel which measure up to 3.7 cm. Cholecystectomy clips noted in the right upper quadrant of the abdomen. Normal heart size. No pleural effusion or edema. Diffuse coarsened interstitial markings identified in both lungs. Multiple calcified granulomas identified bilaterally. IMPRESSION: 1. Interval dilatation of small-bowel loops within the left abdomen. This may reflect focal small bowel ileus versus partial obstruction. Electronically Signed   By: Kerby Moors M.D.   On: 01/06/2018 16:55    Review of Systems  Constitutional: Positive for malaise/fatigue and weight loss. Negative for chills and fever.  HENT: Negative.   Eyes: Negative.   Respiratory: Positive for shortness of breath.   Cardiovascular: Negative.   Gastrointestinal: Positive for abdominal pain.  Genitourinary: Negative.   Musculoskeletal: Negative.   Skin: Negative.   All other systems reviewed and are negative.  Blood pressure (!) 93/55, pulse 80, temperature (!) 97.3 F (36.3 C), temperature source Oral, resp. rate 18, height 5\' 3"  (1.6 m), weight 85 kg, SpO2 93 %. Physical Exam  Constitutional: She is oriented to person, place, and time. She appears well-developed and well-nourished.  HENT:  Head: Normocephalic and atraumatic.  Mouth/Throat: No oropharyngeal exudate.  Eyes: Pupils are equal, round, and reactive to light. Right eye exhibits no discharge. Left eye exhibits no discharge. No scleral icterus.  Neck: Normal range of motion. Neck supple.  Cardiovascular: Normal rate and regular rhythm.  Respiratory: Effort normal.  GI: Soft. There is abdominal tenderness.  Musculoskeletal:        General: No edema.  Neurological: She is alert and oriented to person, place, and time.  Skin: Skin is warm and dry.  Psychiatric: She has a normal mood and affect.    Assessment/Plan: Misty Green is a 46 year old woman with persistent and recurrent acute diverticulitis.  Is undergone multiple CT scans which have failed to show significant resolution of her disease process.  She has also been on multiple courses of antibiotics, again without interval resolution.  She does have a significant history of abdominal surgeries, which will likely complicate any surgical procedure, in addition to the inflammation present from her acute process.  I will contact Dr. Rosana Hoes and determine whether or not he feels that her failure to resolve may push towards an earlier operative date, however as stated, such an operation may be fraught with challenges due to prior surgeries and current inflammation.  Fredirick Maudlin 01/07/2018, 12:47 PM

## 2018-01-07 NOTE — Progress Notes (Signed)
[facility] [place] NAME: Misty Green    MR#:  [id number]  DATE OF BIRTH:  11-08-1970  SUBJECTIVE:  CHIEF COMPLAINT:   Chief Complaint  Patient presents with  . Abdominal Pain   Admitted for recurrent diverticulitis, complains of left lower quadrant abdominal pain, nausea, requesting higher dose of morphine, she is crying because of severe pain in the right lower quadrant.  She is on clear liquid diet but does not want to eat today and feels nauseous.  Recently finished course of Cipro, Flagyl that was given by surgery Dr. Rosana Hoes. REVIEW OF SYSTEMS:   ROS CONSTITUTIONAL: No fever, fatigue or weakness.  EYES: No blurred or double vision.  EARS, NOSE, AND THROAT: No tinnitus or ear pain.  RESPIRATORY: No cough, shortness of breath, wheezing or hemoptysis.  CARDIOVASCULAR: No chest pain, orthopnea, edema.  GASTROINTESTINAL: Nausea, left lower quadrant abdominal pain, states 10 out of 10 in severity.  Present dose of morphine is not helping with her pain. GENITOURINARY: No dysuria, hematuria.  ENDOCRINE: No polyuria, nocturia,  HEMATOLOGY: No anemia, easy bruising or bleeding SKIN: No rash or lesion. MUSCULOSKELETAL: No joint pain or arthritis.   NEUROLOGIC: No tingling, numbness, weakness.  PSYCHIATRY: No anxiety or depression.   DRUG ALLERGIES:   Allergies  Allergen Reactions  . Other Nausea And Vomiting    Anti-depressent that starts with t    VITALS:  Blood pressure (!) 93/55, pulse 80, temperature (!) 97.3 F (36.3 C), temperature source Oral, resp. rate 18, height 5\' 3"  (1.6 m), weight 85 kg, SpO2 93 %.  PHYSICAL EXAMINATION:  GENERAL:  47 y.o.-year-old patient lying in the bed with no acute distress.  EYES: Pupils equal, round, reactive to light and accommodation. No scleral icterus. Extraocular muscles intact.  HEENT: Head atraumatic, normocephalic. Oropharynx and nasopharynx clear.  NECK:  Supple, no jugular  venous distention. No thyroid enlargement, no tenderness.  LUNGS: Normal breath sounds bilaterally, no wheezing, rales,rhonchi or crepitation. No use of accessory muscles of respiration.  CARDIOVASCULAR: S1, S2 normal. No murmurs, rubs, or gallops.  ABDOMEN: Soft, nontender, nondistended. Bowel sounds present. No organomegaly or mass.  EXTREMITIES: No pedal edema, cyanosis, or clubbing.  NEUROLOGIC: Cranial nerves II through XII are intact. Muscle strength 5/5 in all extremities. Sensation intact. Gait not checked.  PSYCHIATRIC: The patient is alert and oriented x 3.  SKIN: No obvious rash, lesion, or ulcer.    LABORATORY PANEL:   CBC Recent Labs  Lab 01/07/18 0555  WBC 4.7  HGB 12.4  HCT 41.4  PLT 179   ------------------------------------------------------------------------------------------------------------------  Chemistries  Recent Labs  Lab 01/06/18 1422 01/07/18 0555  NA 140 141  K 4.0 3.8  CL 102 105  CO2 29 32  GLUCOSE 87 92  BUN 6 6  CREATININE 0.38* 0.49  CALCIUM 9.3 8.3*  AST 23  --   ALT 17  --   ALKPHOS 41  --   BILITOT 0.5  --    ------------------------------------------------------------------------------------------------------------------  Cardiac Enzymes No results for input(s): TROPONINI in the last 168 hours. ------------------------------------------------------------------------------------------------------------------  RADIOLOGY:  Dg Abdomen Acute W/chest  Result Date: 01/06/2018 CLINICAL DATA:  Abdominal pain. Recurrent and persistent diverticulitis. EXAM: DG ABDOMEN ACUTE W/ 1V CHEST COMPARISON:  12/29/2017 FINDINGS: Gas is seen throughout the small and large bowel loops up to the level of the rectum. Within the left abdomen there are several dilated loops of small bowel which measure up to 3.7 cm. Cholecystectomy clips  noted in the right upper quadrant of the abdomen. Normal heart size. No pleural effusion or edema. Diffuse coarsened  interstitial markings identified in both lungs. Multiple calcified granulomas identified bilaterally. IMPRESSION: 1. Interval dilatation of small-bowel loops within the left abdomen. This may reflect focal small bowel ileus versus partial obstruction. Electronically Signed   By: Kerby Moors M.D.   On: 01/06/2018 16:55    EKG:   Orders placed or performed during the hospital encounter of 03/21/16  . EKG 12-Lead  . EKG 12-Lead  . EKG 12-Lead  . EKG 12-Lead  . EKG 12-Lead    ASSESSMENT AND PLAN:   #1. recurrent acute sigmoid colon diverticulitis, patient had 3 rounds of antibiotics, recent CT done December 12 showed sigmoid colon diverticulitis and was given Cipro, Flagyl, patient still has significant pain in the left lower quadrant, appreciate surgery, gastroenterology seeing her at this time, continue IV fluids, liquid diet, increase the dose of morphine patient states that present dose of morphine is not helping her with abdominal pain/continue Zosyn, will consult gastroenterology while she is here at this time. .  #2 history of COPD: No wheezing, continue home dose inhalers, advised to quit smoking. 3.  History of neuropathy: Continue Neurontin. 4.  Depression, patient's told me that her husband is having stress test at [facility] on Monday, history of multiple stents in the heart for him and possible need for CABG.    All the records are reviewed and case discussed with Care Management/Social Workerr. Management plans discussed with the patient, family and they are in agreement.  CODE STATUS: Full code  TOTAL TIME TAKING CARE OF THIS PATIENT: 38 minutes.   POSSIBLE D/C IN 1-2DAYS, DEPENDING ON CLINICAL CONDITION.  Other than 50% time spent in counseling, coordination of care Epifanio Lesches M.D on 01/07/2018 at 11:40 AM  Between 7am to 6pm - Pager - [phone]  After 6pm go to [email or web address] - password [email or web address] [facility]  Office   [phone]  CC: Primary care physician; Sinda Du, MD   Note: This dictation was prepared with Dragon dictation along with smaller phrase technology. Any transcriptional errors that result from this process are unintentional.

## 2018-01-08 ENCOUNTER — Inpatient Hospital Stay: Payer: 59

## 2018-01-08 DIAGNOSIS — K5792 Diverticulitis of intestine, part unspecified, without perforation or abscess without bleeding: Secondary | ICD-10-CM

## 2018-01-08 MED ORDER — MORPHINE SULFATE (PF) 4 MG/ML IV SOLN
4.0000 mg | INTRAVENOUS | Status: DC | PRN
  Administered 2018-01-08: 4 mg via INTRAVENOUS
  Filled 2018-01-08: qty 1

## 2018-01-08 MED ORDER — ENSURE ENLIVE PO LIQD
237.0000 mL | Freq: Three times a day (TID) | ORAL | Status: DC
  Administered 2018-01-08 – 2018-01-12 (×5): 237 mL via ORAL

## 2018-01-08 NOTE — Progress Notes (Signed)
Patient c/o sob. On 2 liters with o2 noted at 96 rr 24 HR 92 BBS wheezes with some rales as well as airway wheezes. Breathing mildly labored. SVN given. Condition unchanged. Agricultural consultant at bedside.

## 2018-01-08 NOTE — Progress Notes (Signed)
Misty Green is a 47 y.o. female patient.  She has been followed by Dr. Rosana Hoes for acute diverticulitis.  She has undergone a number of rounds of antibiotic therapy without resolution of inflammation seen on CT imaging.  She continues to have pain.  She was readmitted to the hospital with worsening abdominal pain after undergoing yet another round of antibiotics.  1. Diverticulitis     Past Medical History:  Diagnosis Date  . COPD (chronic obstructive pulmonary disease) ([place])   . Current smoker   . Opiate abuse, continuous ([facility])     Current Facility-Administered Medications  Medication Dose Route Frequency Provider Last Rate Last Dose  . 0.9 %  sodium chloride infusion   Intravenous Continuous Dustin Flock, MD 100 mL/hr at 01/08/18 0513    . 0.9 %  sodium chloride infusion   Intravenous PRN Epifanio Lesches, MD   Stopped at 01/08/18 [phone]  . acetaminophen (TYLENOL) tablet 650 mg  650 mg Oral Q6H PRN Dustin Flock, MD   650 mg at 01/07/18 1343   Or  . acetaminophen (TYLENOL) suppository 650 mg  650 mg Rectal Q6H PRN Dustin Flock, MD      . albuterol (PROVENTIL) (2.5 MG/3ML) 0.083% nebulizer solution 2.5 mg  2.5 mg Inhalation Q6H PRN Dustin Flock, MD      . ALPRAZolam Duanne Moron) tablet 1 mg  1 mg Oral QID Dustin Flock, MD   1 mg at 01/08/18 0959  . enoxaparin (LOVENOX) injection 40 mg  40 mg Subcutaneous Q24H Dustin Flock, MD   40 mg at 01/07/18 2101  . fluconazole (DIFLUCAN) IVPB 100 mg  100 mg Intravenous Q24H Epifanio Lesches, MD 50 mL/hr at 01/07/18 1414 100 mg at 01/07/18 1414  . gabapentin (NEURONTIN) capsule 800 mg  800 mg Oral TID Dustin Flock, MD   800 mg at 01/08/18 0959  . mometasone-formoterol (DULERA) 200-5 MCG/ACT inhaler 2 puff  2 puff Inhalation BID Dustin Flock, MD   2 puff at 01/08/18 [phone]  . morphine 4 MG/ML injection 4 mg  4 mg Intravenous Q4H PRN Epifanio Lesches, MD   4 mg at 01/08/18 0839  . multivitamin with minerals tablet 1 tablet   1 tablet Oral Daily Dustin Flock, MD   1 tablet at 01/08/18 0959  . ondansetron (ZOFRAN) tablet 4 mg  4 mg Oral Q6H PRN Dustin Flock, MD       Or  . ondansetron [facility]) injection 4 mg  4 mg Intravenous Q6H PRN Dustin Flock, MD   4 mg at 01/08/18 0314  . pantoprazole (PROTONIX) injection 40 mg  40 mg Intravenous Q12H Dustin Flock, MD   40 mg at 01/08/18 0959  . piperacillin-tazobactam (ZOSYN) IVPB 3.375 g  3.375 g Intravenous Q8H Dallie Piles, RPH 12.5 mL/hr at 01/08/18 0513 3.375 g at 01/08/18 0513  . polyethylene glycol (MIRALAX / GLYCOLAX) packet 17 g  1 packet Oral Daily PRN Epifanio Lesches, MD   17 g at 01/07/18 1342  . roflumilast (DALIRESP) tablet 500 mcg  500 mcg Oral Daily Dustin Flock, MD   500 mcg at 01/08/18 1001  . simethicone (MYLICON) chewable tablet 80 mg  80 mg Oral Q6H PRN Epifanio Lesches, MD   80 mg at 01/07/18 1405  . tiotropium (SPIRIVA) inhalation capsule ([facility] use ONLY) 18 mcg  18 mcg Inhalation Daily Dustin Flock, MD   18 mcg at 01/08/18 0839  . vitamin C (ASCORBIC ACID) tablet 1,000 mg  1,000 mg Oral Daily Dustin Flock, MD  1,000 mg at 01/08/18 0959  . zolpidem (AMBIEN) tablet 5 mg  5 mg Oral QHS PRN Dustin Flock, MD   5 mg at 01/07/18 2122   Allergies  Allergen Reactions  . Other Nausea And Vomiting    Anti-depressent that starts with t   Active Problems:   Acute diverticulitis  Blood pressure 118/71, pulse 89, temperature 97.8 F (36.6 C), temperature source Oral, resp. rate 18, height 5\' 3"  (1.6 m), weight 85 kg, SpO2 91 %.  ROS Continues to complain of significant left lower quadrant abdominal pain.  Physical Exam Patient was seen while ambulating in the hall. She is alert and oriented x3.  She is uncomfortable but in no distress. Normal work of breathing on room air. Abdomen is tender to palpation in the left lower quadrant.  She has nondistended.  There is no rebound.  There is some voluntary guarding present.  No  peritoneal signs.  Assessment/Plan: Misty Green is a 47 year old woman with persistent and recurrent acute diverticulitis.  Is undergone multiple CT scans which have failed to show significant resolution of her disease process.  She has also been on multiple courses of antibiotics, again without interval resolution.  She does have a significant history of abdominal surgeries, which will likely complicate any surgical procedure, in addition to the inflammation present from her acute process.  I will contact Dr. Rosana Hoes and determine whether or not he feels that her failure to resolve may push towards an earlier operative date, however as stated, such an operation may be fraught with challenges due to prior surgeries and current inflammation.  Fredirick Maudlin 01/08/2018

## 2018-01-08 NOTE — Progress Notes (Signed)
[facility] [place] NAME: Misty Green    MR#:  [id number]  DATE OF BIRTH:  07-24-1970  SUBJECTIVE:  CHIEF COMPLAINT:   Chief Complaint  Patient presents with  . Abdominal Pain   Admitted for recurrent sigmoid colon diverticulitis, still has a lot of pain in the left lower quadrant, would like to have surgery for recurrent diverticulitis if possible on this admission.  No other complaints except significant pain in the left lower quadrant, nausea. REVIEW OF SYSTEMS:   ROS CONSTITUTIONAL: No fever, fatigue or weakness.  EYES: No blurred or double vision.  EARS, NOSE, AND THROAT: No tinnitus or ear pain.  RESPIRATORY: No cough, shortness of breath, wheezing or hemoptysis.  CARDIOVASCULAR: No chest pain, orthopnea, edema.  GASTROINTESTINAL: Nausea, left lower quadrant abdominal pain, states 10 out of 10 in severity.  Present dose of morphine is not helping with her pain. GENITOURINARY: No dysuria, hematuria.  ENDOCRINE: No polyuria, nocturia,  HEMATOLOGY: No anemia, easy bruising or bleeding SKIN: No rash or lesion. MUSCULOSKELETAL: No joint pain or arthritis.   NEUROLOGIC: No tingling, numbness, weakness.  PSYCHIATRY: No anxiety or depression.   DRUG ALLERGIES:   Allergies  Allergen Reactions  . Other Nausea And Vomiting    Anti-depressent that starts with t    VITALS:  Blood pressure 118/71, pulse 89, temperature 97.8 F (36.6 C), temperature source Oral, resp. rate 18, height 5\' 3"  (1.6 m), weight 85 kg, SpO2 91 %.  PHYSICAL EXAMINATION:  GENERAL:  47 y.o.-year-old patient lying in the bed with no acute distress.  EYES: Pupils equal, round, reactive to light and accommodation. No scleral icterus. Extraocular muscles intact.  HEENT: Head atraumatic, normocephalic. Oropharynx and nasopharynx clear.  NECK:  Supple, no jugular venous distention. No thyroid enlargement, no tenderness.  LUNGS: Normal breath sounds  bilaterally, no wheezing, rales,rhonchi or crepitation. No use of accessory muscles of respiration.  CARDIOVASCULAR: S1, S2 normal. No murmurs, rubs, or gallops.  ABDOMEN: Soft, nontender, nondistended. Bowel sounds present. No organomegaly or mass.  EXTREMITIES: No pedal edema, cyanosis, or clubbing.  NEUROLOGIC: Cranial nerves II through XII are intact. Muscle strength 5/5 in all extremities. Sensation intact. Gait not checked.  PSYCHIATRIC: The patient is alert and oriented x 3.  SKIN: No obvious rash, lesion, or ulcer.    LABORATORY PANEL:   CBC Recent Labs  Lab 01/07/18 0555  WBC 4.7  HGB 12.4  HCT 41.4  PLT 179   ------------------------------------------------------------------------------------------------------------------  Chemistries  Recent Labs  Lab 01/06/18 1422 01/07/18 0555  NA 140 141  K 4.0 3.8  CL 102 105  CO2 29 32  GLUCOSE 87 92  BUN 6 6  CREATININE 0.38* 0.49  CALCIUM 9.3 8.3*  AST 23  --   ALT 17  --   ALKPHOS 41  --   BILITOT 0.5  --    ------------------------------------------------------------------------------------------------------------------  Cardiac Enzymes No results for input(s): TROPONINI in the last 168 hours. ------------------------------------------------------------------------------------------------------------------  RADIOLOGY:  Dg Abdomen Acute W/chest  Result Date: 01/06/2018 CLINICAL DATA:  Abdominal pain. Recurrent and persistent diverticulitis. EXAM: DG ABDOMEN ACUTE W/ 1V CHEST COMPARISON:  12/29/2017 FINDINGS: Gas is seen throughout the small and large bowel loops up to the level of the rectum. Within the left abdomen there are several dilated loops of small bowel which measure up to 3.7 cm. Cholecystectomy clips noted in the right upper quadrant of the abdomen. Normal heart size. No pleural effusion or edema. Diffuse coarsened  interstitial markings identified in both lungs. Multiple calcified granulomas identified  bilaterally. IMPRESSION: 1. Interval dilatation of small-bowel loops within the left abdomen. This may reflect focal small bowel ileus versus partial obstruction. Electronically Signed   By: Kerby Moors M.D.   On: 01/06/2018 16:55    EKG:   Orders placed or performed during the hospital encounter of 03/21/16  . EKG 12-Lead  . EKG 12-Lead  . EKG 12-Lead  . EKG 12-Lead  . EKG 12-Lead    ASSESSMENT AND PLAN:   #1. recurrent acute sigmoid colon diverticulitis, patient had 3 rounds of antibiotics, recent CT done December 12 showed sigmoid colon diverticulitis and was given Cipro, Flagyl, patient still has significant pain in the left lower quadrant, continue IV antibiotics Zosyn, IV fluids, morphine for pain control, appreciate surgery seeing, evaluating to see if she needs a drain on this admission.  .  #2 history of COPD: No wheezing, continue home dose inhalers, advised to quit smoking. 3.  History of neuropathy: Continue Neurontin.  4.  Depression, patient's told me that her husband is having stress test at [facility] on Monday, history of multiple stents in the heart for him and possible need for CABG.    All the records are reviewed and case discussed with Care Management/Social Workerr. Management plans discussed with the patient, family and they are in agreement.  CODE STATUS: Full code  TOTAL TIME TAKING CARE OF THIS PATIENT: 38 minutes.   POSSIBLE D/C IN 1-2DAYS, DEPENDING ON CLINICAL CONDITION.  Other than 50% time spent in counseling, coordination of care Epifanio Lesches M.D on 01/08/2018 at 12:52 PM  Between 7am to 6pm - Pager - [phone]  After 6pm go to [email or web address] - password [email or web address] [facility]  Office  [phone]  CC: Primary care physician; Sinda Du, MD   Note: This dictation was prepared with Dragon dictation along with smaller phrase technology. Any transcriptional errors that result from this process are  unintentional.

## 2018-01-08 NOTE — Progress Notes (Signed)
Pt lethargic, breathing treatment given for wheezing, md notified, pt lethargic. Orders given.

## 2018-01-08 NOTE — Progress Notes (Signed)
Prime doc paged regarding ABG.

## 2018-01-08 NOTE — Progress Notes (Signed)
Pt was seen at 0830 this am for a new iv site; pt was thoroughly assessed with ultrasound, on left and right arms;  Only "healthy" veins were in the antecubital areas;  Pt was agreeable to having the iv placed in the Left arm, as she is Right handed.  IV was started on the 1st attempt, good bld return, and flushed; suggested pt put a hospital gown on, as the sleeve could act as a tourniquet and ruin the iv.  Received another consult that the iv hurts when the pt bends her arm.  This was explained to her this am, that she would have to keep her arm straight; she has NO OTHER VEINS for access.  Spoke to Bernice, Therapist, sports re pt's access. Will keep current site, as it is working fine, looks fine, per Therapist, sports.   Raynelle Fanning RN IV Team

## 2018-01-08 NOTE — Consult Note (Signed)
Lucilla Lame, MD [facility]  [address], [place], [place] [address] Phone: [phone] Fax : [phone]  Consultation  Referring Provider:     Dr. Vianne Bulls  Primary Care Physician:  Sinda Du, MD Primary Gastroenterologist:  Dr. Gustavo Lah         Reason for Consultation:     Recurrent diverticulitis  Date of Admission:  01/06/2018 Date of Consultation:  01/08/2018         HPI:   Misty Green is a 47 y.o. female who has a history of diverticulitis with hospital admissions including an 11 day stay in the hospital.  The patient had seen Dr. Marton Redwood nurse and set up for a colonoscopy which was supposed to take place tomorrow.  The patient has been on multiple courses of antibiotics and not improved.  The patient states that she feels better for a short time but the pain returns in the left lower quadrant.  She also reports that if you press on the right side of her abdomen her left side hurts.  The patient has had surgery in the past including a hysterectomy and a abdominal abdominal hernia repair. The patient reports that she was seen by Dr. Rosana Hoes but no surgery was planned at that time.  She was now evaluated by Dr. Celine Ahr who is concerned about the patient's multiple previous surgeries and possible complications.  I am now being asked to see the patient for recurrent left-sided abdominal pain. The patient also states that she had a colonoscopy with 11 polyps in the past.  Past Medical History:  Diagnosis Date  . COPD (chronic obstructive pulmonary disease) ([place])   . Current smoker   . Opiate abuse, continuous ([place])     Past Surgical History:  Procedure Laterality Date  . ABDOMINAL HYSTERECTOMY    . BACK SURGERY    . cesction    . INCISIONAL HERNIA REPAIR     lower midline laparotomy incision (hysterectomy), repaired with mesh  . LAPAROSCOPIC CHOLECYSTECTOMY  2012  . ORIF WRIST FRACTURE Right 08/25/2015   Procedure: OPEN REDUCTION INTERNAL FIXATION (ORIF)  WRIST FRACTURE;  Surgeon: Corky Mull, MD;  Location: [facility] ORS;  Service: Orthopedics;  Laterality: Right;    Prior to Admission medications   Medication Sig Start Date End Date Taking? Authorizing Provider  ALPRAZolam Duanne Moron) 1 MG tablet Take 1 mg by mouth 4 (four) times daily.   Yes [provider]  Fluticasone-Salmeterol (ADVAIR) 250-50 MCG/DOSE AEPB Inhale 1 puff into the lungs 2 (two) times daily.   Yes [provider]  furosemide (LASIX) 40 MG tablet Take 40 mg by mouth daily as needed for fluid.    Yes [provider]  gabapentin (NEURONTIN) 300 MG capsule Take 800 mg by mouth 3 (three) times daily.    Yes [provider]  Multiple Vitamins-Minerals (MULTIVITAMIN WITH MINERALS) tablet Take 1 tablet by mouth daily.   Yes [provider]  roflumilast (DALIRESP) 500 MCG TABS tablet Take 500 mcg by mouth daily.   Yes [provider]  tiotropium (SPIRIVA) 18 MCG inhalation capsule Place 18 mcg into inhaler and inhale daily.   Yes [provider]  zolpidem (AMBIEN) 10 MG tablet Take 10 mg by mouth at bedtime as needed for sleep.   Yes [provider]  albuterol (PROVENTIL HFA;VENTOLIN HFA) 108 (90 Base) MCG/ACT inhaler Inhale into the lungs every 6 (six) hours as needed for wheezing or shortness of breath.    [provider]  Ascorbic Acid (VITAMIN C) 1000 MG tablet Take 1,000 mg by mouth daily.    [provider]    Family History  Problem Relation Age of Onset  . COPD Mother   . Hypertension Father   . Diabetes Father      Social History   Tobacco Use  . Smoking status: Current Every Day Smoker    Packs/day: 1.00    Years: 15.00    Pack years: 15.00    Types: Cigarettes  . Smokeless tobacco: Never Used  Substance Use Topics  . Alcohol use: No  . Drug use: Yes    Types: Marijuana    Comment: Yesterday    Allergies as of 01/06/2018 - Review Complete 01/06/2018  Allergen Reaction Noted    . Other Nausea And Vomiting 03/21/2016    Review of Systems:    All systems reviewed and negative except where noted in HPI.   Physical Exam:  Vital signs in last 24 hours: Temp:  [97.8 F (36.6 C)-98.3 F (36.8 C)] 97.8 F (36.6 C) (12/22 0533) Pulse Rate:  [65-89] 89 (12/22 0533) Resp:  [16-21] 18 (12/22 0533) BP: (114-134)/(66-71) 118/71 (12/22 0533) SpO2:  [91 %-93 %] 91 % (12/22 0533) Last BM Date: 01/06/18 General:   Pleasant, cooperative in NAD Head:  Normocephalic and atraumatic. Eyes:   No icterus.   Conjunctiva pink. PERRLA. Ears:  Normal auditory acuity. Neck:  Supple; no masses or thyroidomegaly Lungs: Respirations even and unlabored. Lungs clear to auscultation bilaterally.   No wheezes, crackles, or rhonchi.  Heart:  Regular rate and rhythm;  Without murmur, clicks, rubs or gallops Abdomen:  Soft, nondistended, diffusely tender abdomen with more tenderness on the left lower quadrant. Normal bowel sounds. No appreciable masses or hepatomegaly.  No rebound or guarding.  Rectal:  Not performed. Msk:  Symmetrical without gross deformities.    Extremities:  Without edema, cyanosis or clubbing. Neurologic:  Alert and oriented x3;  grossly normal neurologically. Skin:  Intact without significant lesions or rashes. Cervical Nodes:  No significant cervical adenopathy. Psych:  Alert and cooperative. Normal affect.  LAB RESULTS: Recent Labs    01/06/18 1422 01/07/18 0555  WBC 7.2 4.7  HGB 14.3 12.4  HCT 46.2* 41.4  PLT 226 179   BMET Recent Labs    01/06/18 1422 01/07/18 0555  NA 140 141  K 4.0 3.8  CL 102 105  CO2 29 32  GLUCOSE 87 92  BUN 6 6  CREATININE 0.38* 0.49  CALCIUM 9.3 8.3*   LFT Recent Labs    01/06/18 1422  PROT 7.7  ALBUMIN 4.0  AST 23  ALT 17  ALKPHOS 41  BILITOT 0.5   PT/INR No results for input(s): LABPROT, INR in the last 72 hours.  STUDIES: Dg Abdomen Acute W/chest  Result Date: 01/06/2018 CLINICAL DATA:  Abdominal  pain. Recurrent and persistent diverticulitis. EXAM: DG ABDOMEN ACUTE W/ 1V CHEST COMPARISON:  12/29/2017 FINDINGS: Gas is seen throughout the small and large bowel loops up to the level of the rectum. Within the left abdomen there are several dilated loops of small bowel which measure up to 3.7 cm. Cholecystectomy clips noted in the right upper quadrant of the abdomen. Normal heart size. No pleural effusion or edema. Diffuse coarsened interstitial markings identified in both lungs. Multiple calcified granulomas identified bilaterally. IMPRESSION: 1. Interval dilatation of small-bowel loops within the left abdomen. This may reflect focal small bowel ileus versus partial obstruction. Electronically Signed   By: Lovena Le  Clovis Riley M.D.   On: 01/06/2018 16:55      Impression / Plan:   Assessment: Active Problems:   Acute diverticulitis   Misty Green is a 47 y.o. y/o female with what appears to be recurrent versus persistent diverticulitis.  The patient has had imaging showing diverticulitis and symptoms consistent with diverticulitis.  Plan:  This patient has a history of colon polyps and will need a colonoscopy when her diverticulitis has resolved and has given ample time to heal.  It appears that this patient may not benefit from conservative treatment and may need surgical intervention.  The patient is being followed by surgery at this time.  The patient has been given my card and has been told that there is nothing to do for her endoscopically at this point and that her recurrent diverticulitis will need to be treated medically or surgically although medical treatment has failed up until this point.  Further recommendations per surgery. I will sign off.  Please call if any further GI concerns or questions.  We would like to thank you for the opportunity to participate in the care of Misty Green.    Thank you for involving me in the care of this patient.      LOS: 2 days   Lucilla Lame, MD  01/08/2018, 1:20 PM    Note: This dictation was prepared with Dragon dictation along with smaller phrase technology. Any transcriptional errors that result from this process are unintentional.

## 2018-01-08 NOTE — Progress Notes (Signed)
Initial Nutrition Assessment  DOCUMENTATION CODES:   Obesity unspecified  INTERVENTION:  Provide Ensure Enlive po TID, each supplement provides 350 kcal and 20 grams of protein.   Continue daily MVI.  NUTRITION DIAGNOSIS:   Inadequate oral intake related to decreased appetite, acute illness(diverticulitis) as evidenced by per patient/family report.  GOAL:   Patient will meet greater than or equal to 90% of their needs  MONITOR:   PO intake, Supplement acceptance, Labs, Weight trends, I & O's  REASON FOR ASSESSMENT:   Malnutrition Screening Tool    ASSESSMENT:   47 year old female with PMHx of COPD, opiate abuse, and PSHx of back surgery, C-section, abdominal hysterectomy, incisional hernia repair, laparoscopic cholecystectomy who is now admitted for recurrent sigmoid colon diverticulitis now s/p 3 rounds of antibiotics.   -Surgery has been consulted and is following.  Met with patient at bedside. She is known to this RD from a previous admission last month where RD was consulted to educate patient on low-fiber diet for diverticulitis. Patient reports that over the past month she has been very afraid to eat. She reports she is only eating 5 peanut butter crackers, 1-2 bowls of chicken noodle soup, and some water at home. She reports she was on a low fiber diet meaning she "had to eat low protein." Discussed that a low fiber diet is separate from protein intake. Reviewed diet education given last month. Encouraged patient to choose a good source of protein at each meal. Patient is also amenable to drinking an ONS between meals to help meet calorie/protein needs.   Patient reports she is unsure what her UBW is but she has lost 25 lbs since she was diagnosed with diverticulitis. Also unable to report what her current weight is so unsure how she knows she lost 25 lbs. Per chart she was 86.6 kg on 11/28/2017. She is currently 85 kg (187.4 lbs), which is not a significant change in  weight over approximately 5 weeks.  Meal Completion: 100%  Medications reviewed and include: Xanax 1 mg QID, gabapentin, MVI daily, pantoprazole, vitamin C 1000 mg daily (from home med list), NS @ 48m/hr, Diflucan, Zosyn.  Labs reviewed.  Patient does not meet criteria for malnutrition.  Discussed with RN.  NUTRITION - FOCUSED PHYSICAL EXAM:    Most Recent Value  Orbital Region  No depletion  Upper Arm Region  Mild depletion  Thoracic and Lumbar Region  No depletion  Buccal Region  No depletion  Temple Region  No depletion  Clavicle Bone Region  No depletion  Clavicle and Acromion Bone Region  No depletion  Scapular Bone Region  No depletion  Dorsal Hand  No depletion  Patellar Region  No depletion  Anterior Thigh Region  No depletion  Posterior Calf Region  No depletion  Edema (RD Assessment)  None  Hair  Reviewed  Eyes  Reviewed  Mouth  Reviewed  Skin  Reviewed  Nails  Reviewed     Diet Order:   Diet Order            DIET SOFT Room service appropriate? Yes; Fluid consistency: Thin  Diet effective now             EDUCATION NEEDS:   Education needs have been addressed  Skin:  Skin Assessment: Reviewed RN Assessment  Last BM:  01/06/2018 per chart  Height:   Ht Readings from Last 1 Encounters:  01/06/18 _0  (1.6 m)   Weight:   Wt Readings from Last  1 Encounters:  01/06/18 85 kg   Ideal Body Weight:  52.3 kg  BMI:  Body mass index is 33.2 kg/m.  Estimated Nutritional Needs:   Kcal:  1740-2030 (MSJ x 1.2-1.4)  Protein:  85-95 grams (1-1.1 grams/kg)  Fluid:  1.7-2 L/day (1 mL/kcal)  Willey Blade, MS, RD, LDN Office: [phone] Pager: [phone] After Hours/Weekend Pager: [phone]

## 2018-01-09 ENCOUNTER — Encounter: Admission: RE | Payer: Self-pay | Source: Home / Self Care

## 2018-01-09 ENCOUNTER — Ambulatory Visit: Admission: RE | Admit: 2018-01-09 | Payer: 59 | Source: Home / Self Care | Admitting: Gastroenterology

## 2018-01-09 LAB — BLOOD GAS, ARTERIAL
Acid-Base Excess: 8.3 mmol/L — ABNORMAL HIGH (ref 0.0–2.0)
Bicarbonate: 36.7 mmol/L — ABNORMAL HIGH (ref 20.0–28.0)
FIO2: 0.28
O2 Saturation: 83.3 %
Patient temperature: 37
pCO2 arterial: 68 mmHg (ref 32.0–48.0)
pH, Arterial: 7.34 — ABNORMAL LOW (ref 7.350–7.450)
pO2, Arterial: 51 mmHg — ABNORMAL LOW (ref 83.0–108.0)

## 2018-01-09 SURGERY — EGD (ESOPHAGOGASTRODUODENOSCOPY)
Anesthesia: General

## 2018-01-09 MED ORDER — IPRATROPIUM-ALBUTEROL 0.5-2.5 (3) MG/3ML IN SOLN
3.0000 mL | Freq: Four times a day (QID) | RESPIRATORY_TRACT | Status: DC
  Administered 2018-01-09: 3 mL via RESPIRATORY_TRACT
  Filled 2018-01-09: qty 3

## 2018-01-09 MED ORDER — MORPHINE SULFATE (PF) 2 MG/ML IV SOLN
2.0000 mg | INTRAVENOUS | Status: DC | PRN
  Administered 2018-01-09 – 2018-01-13 (×21): 2 mg via INTRAVENOUS
  Filled 2018-01-09 (×21): qty 1

## 2018-01-09 MED ORDER — OXYCODONE HCL 5 MG PO TABS
5.0000 mg | ORAL_TABLET | Freq: Four times a day (QID) | ORAL | Status: DC | PRN
  Administered 2018-01-09 – 2018-01-10 (×3): 5 mg via ORAL
  Filled 2018-01-09 (×3): qty 1

## 2018-01-09 MED ORDER — ESCITALOPRAM OXALATE 10 MG PO TABS
10.0000 mg | ORAL_TABLET | Freq: Every day | ORAL | Status: DC
  Administered 2018-01-09 – 2018-01-18 (×9): 10 mg via ORAL
  Filled 2018-01-09 (×10): qty 1

## 2018-01-09 MED ORDER — METHYLPREDNISOLONE SODIUM SUCC 125 MG IJ SOLR
60.0000 mg | Freq: Four times a day (QID) | INTRAMUSCULAR | Status: DC
  Administered 2018-01-09 – 2018-01-11 (×10): 60 mg via INTRAVENOUS
  Filled 2018-01-09 (×10): qty 2

## 2018-01-09 MED ORDER — IPRATROPIUM-ALBUTEROL 0.5-2.5 (3) MG/3ML IN SOLN
3.0000 mL | Freq: Three times a day (TID) | RESPIRATORY_TRACT | Status: DC
  Administered 2018-01-09 – 2018-01-18 (×23): 3 mL via RESPIRATORY_TRACT
  Filled 2018-01-09 (×26): qty 3

## 2018-01-09 MED ORDER — METHYLPREDNISOLONE SODIUM SUCC 125 MG IJ SOLR
125.0000 mg | Freq: Once | INTRAMUSCULAR | Status: AC
  Administered 2018-01-09: 125 mg via INTRAVENOUS
  Filled 2018-01-09: qty 2

## 2018-01-09 NOTE — Progress Notes (Signed)
Dr Jerelyn Charles paged with chest xray results, orders given.

## 2018-01-09 NOTE — Progress Notes (Signed)
Seen and examined.  Discussed the case in detail with both Dr. Vianne Bulls and Dr. Rosana Hoes. Feels she has a history of chronic diverticulitis that has failed antibiotic management.  She does not have any evidence of perforation or peritonitis at this time.  I had a lengthy discussion with the patient and I do think what colectomy is indicated during this hospitalization. Tentatively we will perform the surgery on Friday after she gets a bowel prep on Thursday.  I have discussed with the patient in detail about the procedure.  Risks, benefits and possible complications including but not limited to: Bleeding, infection, anastomotic leak, need for ostomy.  She understands and wishes to proceed. She does have some hx of chronic pain and this may be an ongoing issue. I Spent at least 35 minutes in this encounter with greater than 50% of the time spent in coordination and counseling of her care

## 2018-01-09 NOTE — Progress Notes (Addendum)
[place] [facility] [place] NAME: Misty Green    MR#:  [id number]  DATE OF BIRTH:  31-Mar-1970  SUBJECTIVE:  CHIEF COMPLAINT:   Chief Complaint  Patient presents with  . Abdominal Pain   Noted to be lethargic last night, chest x-ray showed no pneumonia.  Does have some wheezing, on room air sats are 91%.  Still has left lower quadrant pain told her that higher dose of morphine can effecther breathing REVIEW OF SYSTEMS:   ROS CONSTITUTIONAL: No fever, fatigue or weakness.  EYES: No blurred or double vision.  EARS, NOSE, AND THROAT: No tinnitus or ear pain.  RESPIRATORY: No cough, shortness of breath, wheezing or hemoptysis.  CARDIOVASCULAR: No chest pain, orthopnea, edema.  GASTROINTESTINAL: Nausea, left lower quadrant abdominal pain, states 10 out of 10 in severity. GENITOURINARY: No dysuria, hematuria.  ENDOCRINE: No polyuria, nocturia,  HEMATOLOGY: No anemia, easy bruising or bleeding SKIN: No rash or lesion. MUSCULOSKELETAL: No joint pain or arthritis.   NEUROLOGIC: No tingling, numbness, weakness.  PSYCHIATRY: No anxiety or depression.   DRUG ALLERGIES:   Allergies  Allergen Reactions  . Other Nausea And Vomiting    Anti-depressent that starts with t    VITALS:  Blood pressure 121/64, pulse 77, temperature 97.6 F (36.4 C), temperature source Oral, resp. rate (!) 22, height 5\' 3"  (1.6 m), weight 85 kg, SpO2 91 %.  PHYSICAL EXAMINATION:  GENERAL:  47 y.o.-year-old patient lying in the bed with no acute distress.  EYES: Pupils equal, round, reactive to light and accommodation. No scleral icterus. Extraocular muscles intact.  HEENT: Head atraumatic, normocephalic. Oropharynx and nasopharynx clear.  NECK:  Supple, no jugular venous distention. No thyroid enlargement, no tenderness.  LUNGS: Expiratory wheeze in all lung fields.  CARDIOVASCULAR: S1, S2 normal. No murmurs, rubs, or gallops.  ABDOMEN: Soft, nontender,  nondistended. Bowel sounds present. No organomegaly or mass.  EXTREMITIES: No pedal edema, cyanosis, or clubbing.  NEUROLOGIC: Cranial nerves II through XII are intact. Muscle strength 5/5 in all extremities. Sensation intact. Gait not checked.  PSYCHIATRIC: The patient is alert and oriented x 3.  SKIN: No obvious rash, lesion, or ulcer.    LABORATORY PANEL:   CBC Recent Labs  Lab 01/07/18 0555  WBC 4.7  HGB 12.4  HCT 41.4  PLT 179   ------------------------------------------------------------------------------------------------------------------  Chemistries  Recent Labs  Lab 01/06/18 1422 01/07/18 0555  NA 140 141  K 4.0 3.8  CL 102 105  CO2 29 32  GLUCOSE 87 92  BUN 6 6  CREATININE 0.38* 0.49  CALCIUM 9.3 8.3*  AST 23  --   ALT 17  --   ALKPHOS 41  --   BILITOT 0.5  --    ------------------------------------------------------------------------------------------------------------------  Cardiac Enzymes No results for input(s): TROPONINI in the last 168 hours. ------------------------------------------------------------------------------------------------------------------  RADIOLOGY:  Dg Chest 2 View  Result Date: 01/08/2018 CLINICAL DATA:  Increased shortness of breath EXAM: CHEST - 2 VIEW COMPARISON:  01/06/2018, 12/03/2017, 11/28/2017, 03/21/2016 FINDINGS: Hyperinflation. Coarse chronic interstitial opacity. Multiple calcified granuloma. No focal consolidation or effusion. Normal cardiomediastinal silhouette. No pneumothorax. IMPRESSION: No active cardiopulmonary disease. Chronic coarse interstitial opacity and calcified granuloma. Electronically Signed   By: Donavan Foil M.D.   On: 01/08/2018 23:42    EKG:   Orders placed or performed during the hospital encounter of 03/21/16  . EKG 12-Lead  . EKG 12-Lead  . EKG 12-Lead  . EKG 12-Lead  . EKG 12-Lead  ASSESSMENT AND PLAN:   #1. recurrent acute sigmoid colon diverticulitis, failed antibiotic  therapy, discussed with Dr. Perrin Maltese, patient tentatively scheduled for possible colectomy on Friday.  Appreciate Dr. Adora Fridge seeing the patient.\  .  #2. COPD exacerbation: Patient has wheezing, added IV steroids, bronchodilators, continue low-dose steroids, oxygen and see how she does.  3.  History of neuropathy: Continue Neurontin.  4.  Depression, chronic pain issues: Told that higher dose of morphine can affect her respiratory status and recommended her not to ask for high-dose morphine, she understands.   All the records are reviewed and case discussed with Care Management/Social Workerr. Management plans discussed with the patient, family and they are in agreement.  CODE STATUS: Full code  TOTAL TIME TAKING CARE OF THIS PATIENT: 38 minutes.   POSSIBLE D/C IN 1-2DAYS, DEPENDING ON CLINICAL CONDITION.  Other than 50% time spent in counseling, coordination of care Epifanio Lesches M.D on 01/09/2018 at 1:30 PM  Between 7am to 6pm - Pager - [phone]  After 6pm go to [email or web address] - password [email or web address] [facility]  Office  [phone]  CC: Primary care physician; Sinda Du, MD   Note: This dictation was prepared with Dragon dictation along with smaller phrase technology. Any transcriptional errors that result from this process are unintentional.

## 2018-01-10 MED ORDER — GUAIFENESIN ER 600 MG PO TB12
600.0000 mg | ORAL_TABLET | Freq: Two times a day (BID) | ORAL | Status: DC
  Administered 2018-01-10 – 2018-01-18 (×16): 600 mg via ORAL
  Filled 2018-01-10 (×16): qty 1

## 2018-01-10 MED ORDER — OXYCODONE HCL 5 MG PO TABS
5.0000 mg | ORAL_TABLET | ORAL | Status: DC | PRN
  Administered 2018-01-10 – 2018-01-12 (×11): 5 mg via ORAL
  Filled 2018-01-10 (×14): qty 1

## 2018-01-10 MED ORDER — FUROSEMIDE 20 MG PO TABS
20.0000 mg | ORAL_TABLET | Freq: Every day | ORAL | Status: DC
  Administered 2018-01-10 – 2018-01-18 (×8): 20 mg via ORAL
  Filled 2018-01-10 (×8): qty 1

## 2018-01-10 MED ORDER — NICOTINE 14 MG/24HR TD PT24
14.0000 mg | MEDICATED_PATCH | Freq: Every day | TRANSDERMAL | Status: DC
  Administered 2018-01-10 – 2018-01-12 (×2): 14 mg via TRANSDERMAL
  Filled 2018-01-10 (×8): qty 1

## 2018-01-10 NOTE — Progress Notes (Signed)
Patient says she is hurting significantly and is requesting that I call the Doctor.  Dr Vianne Bulls changed oxycodone from q6h to q4h.

## 2018-01-10 NOTE — Progress Notes (Signed)
pT STATED THAT SHE DOSEN";T NEED ANY HELP WITH CPAP.

## 2018-01-10 NOTE — Progress Notes (Signed)
Patient is requesting something to break of the phlegm in her chest.  Order received from Dr Vianne Bulls for mucinex

## 2018-01-10 NOTE — Progress Notes (Signed)
[employer] Hospital Day(s): 4.   Post op day(s):  Marland Kitchen   Interval History: Patient seen and examined, no acute events or new complaints overnight. Patient reports continue with pain on left lower quadrant, hips, right costal area and in her legs. Denies nausea or vomiting.  Vital signs in last 24 hours: [min-max] current  Temp:  [97.4 F (36.3 C)-98.4 F (36.9 C)] 98.4 F (36.9 C) (12/24 0525) Pulse Rate:  [77-86] 83 (12/24 0525) Resp:  [18-20] 18 (12/24 0525) BP: (121-139)/(64-77) 125/71 (12/24 0525) SpO2:  [90 %-91 %] 90 % (12/24 0525) FiO2 (%):  [28 %] 28 % (12/23 2130)     Height: 5\' 3"  (160 cm) Weight: 85 kg BMI (Calculated): 33.2   Physical Exam:  Constitutional: alert, cooperative and no distress  Respiratory: breathing non-labored at rest  Cardiovascular: regular rate and sinus rhythm  Gastrointestinal: soft, non-tender, and non-distended Extremities: bilateral pitting edema +2  Labs:  CBC Latest Ref Rng & Units 01/07/2018 01/06/2018 12/22/2017  WBC 4.0 - 10.5 K/uL 4.7 7.2 6.6  Hemoglobin 12.0 - 15.0 g/dL 12.4 14.3 13.8  Hematocrit 36.0 - 46.0 % 41.4 46.2(H) 45.1  Platelets 150 - 400 K/uL 179 226 347   CMP Latest Ref Rng & Units 01/07/2018 01/06/2018 12/22/2017  Glucose 70 - 99 mg/dL 92 87 95  BUN 6 - 20 mg/dL 6 6 <5(L)  Creatinine 0.44 - 1.00 mg/dL 0.49 0.38(L) 0.51  Sodium 135 - 145 mmol/L 141 140 140  Potassium 3.5 - 5.1 mmol/L 3.8 4.0 3.4(L)  Chloride 98 - 111 mmol/L 105 102 103  CO2 22 - 32 mmol/L 32 29 28  Calcium 8.9 - 10.3 mg/dL 8.3(L) 9.3 9.3  Total Protein 6.5 - 8.1 g/dL - 7.7 -  Total Bilirubin 0.3 - 1.2 mg/dL - 0.5 -  Alkaline Phos 38 - 126 U/L - 41 -  AST 15 - 41 U/L - 23 -  ALT 0 - 44 U/L - 17 -   Imaging studies: No new pertinent imaging studies  Assessment/Plan:  47 year old woman with persistent and recurrent acute diverticulitis. Continue with pain. Patient tolerating diet. No nausea or vomiting. Will continue IV antibiotic  therapy and optimize patient for surgical management. Will add Lasix due to worsening bilateral lower extremity edema.   Arnold Long, MD

## 2018-01-10 NOTE — Plan of Care (Signed)
Pain is not controlled very well.

## 2018-01-10 NOTE — Progress Notes (Signed)
[place] at [place] NAME: Misty Green    MR#:  [id number]  DATE OF BIRTH:  08-Feb-1970  SUBJECTIVE:  CHIEF COMPLAINT:   Chief Complaint  Patient presents with  . Abdominal Pain   Has left lower quadrant pain  REVIEW OF SYSTEMS:   ROS CONSTITUTIONAL: No fever, fatigue or weakness.  EYES: No blurred or double vision.  EARS, NOSE, AND THROAT: No tinnitus or ear pain.  RESPIRATORY: No cough, shortness of breath, wheezing or hemoptysis.  CARDIOVASCULAR: No chest pain, orthopnea, edema.  GASTROINTESTINAL: Nausea, left lower quadrant abdominal pain, states 10 out of 10 in severity. GENITOURINARY: No dysuria, hematuria.  ENDOCRINE: No polyuria, nocturia,  HEMATOLOGY: No anemia, easy bruising or bleeding SKIN: No rash or lesion. MUSCULOSKELETAL: No joint pain or arthritis.   NEUROLOGIC: No tingling, numbness, weakness.  PSYCHIATRY: No anxiety or depression.   DRUG ALLERGIES:   Allergies  Allergen Reactions  . Other Nausea And Vomiting    Anti-depressent that starts with t    VITALS:  Blood pressure 125/71, pulse 83, temperature 98.4 F (36.9 C), temperature source Oral, resp. rate 18, height 5\' 3"  (1.6 m), weight 85 kg, SpO2 90 %.  PHYSICAL EXAMINATION:  GENERAL:  47 y.o.-year-old patient lying in the bed with no acute distress.  EYES: Pupils equal, round, reactive to light and accommodation. No scleral icterus. Extraocular muscles intact.  HEENT: Head atraumatic, normocephalic. Oropharynx and nasopharynx clear.  NECK:  Supple, no jugular venous distention. No thyroid enlargement, no tenderness.  LUNGS: Had expiratory wheeze in all lung's yesterday but mostly clear today. CARDIOVASCULAR: S1, S2 normal. No murmurs, rubs, or gallops.  ABDOMEN: Soft, nontender, nondistended. Bowel sounds present. No organomegaly or mass.  EXTREMITIES: No pedal edema, cyanosis, or clubbing.  NEUROLOGIC: Cranial nerves II through XII are  intact. Muscle strength 5/5 in all extremities. Sensation intact. Gait not checked.  PSYCHIATRIC: The patient is alert and oriented x 3.  SKIN: No obvious rash, lesion, or ulcer.    LABORATORY PANEL:   CBC Recent Labs  Lab 01/07/18 0555  WBC 4.7  HGB 12.4  HCT 41.4  PLT 179   ------------------------------------------------------------------------------------------------------------------  Chemistries  Recent Labs  Lab 01/06/18 1422 01/07/18 0555  NA 140 141  K 4.0 3.8  CL 102 105  CO2 29 32  GLUCOSE 87 92  BUN 6 6  CREATININE 0.38* 0.49  CALCIUM 9.3 8.3*  AST 23  --   ALT 17  --   ALKPHOS 41  --   BILITOT 0.5  --    ------------------------------------------------------------------------------------------------------------------  Cardiac Enzymes No results for input(s): TROPONINI in the last 168 hours. ------------------------------------------------------------------------------------------------------------------  RADIOLOGY:  Dg Chest 2 View  Result Date: 01/08/2018 CLINICAL DATA:  Increased shortness of breath EXAM: CHEST - 2 VIEW COMPARISON:  01/06/2018, 12/03/2017, 11/28/2017, 03/21/2016 FINDINGS: Hyperinflation. Coarse chronic interstitial opacity. Multiple calcified granuloma. No focal consolidation or effusion. Normal cardiomediastinal silhouette. No pneumothorax. IMPRESSION: No active cardiopulmonary disease. Chronic coarse interstitial opacity and calcified granuloma. Electronically Signed   By: Donavan Foil M.D.   On: 01/08/2018 23:42    EKG:   Orders placed or performed during the hospital encounter of 03/21/16  . EKG 12-Lead  . EKG 12-Lead  . EKG 12-Lead  . EKG 12-Lead  . EKG 12-Lead    ASSESSMENT AND PLAN:   #1. recurrent acute sigmoid colon diverticulitis, failed antibiotic therapy, discussed with Dr. Perrin Maltese, patient tentatively scheduled for possible colectomy on Friday.  Continue  IV antibiotics, IV and p.o. pain meds, decreased dose of  IV morphine from 4 to 2 mg because of her lethargy, concern about respiratory depression with narcotics day before.  .  #2. COPD exacerbation: We will improving, continue IV steroids, bronchodilators.  History of chronic leg edema, use IV Lasix as her shortness of breath, leg edema is worse. 3.  History of neuropathy: Continue Neurontin.  4.  Depression, chronic pain issues: Told that higher dose of morphine can affect her respiratory status and recommended her not to ask for high-dose morphine, she understands.   All the records are reviewed and case discussed with Care Management/Social Workerr. Management plans discussed with the patient, family and they are in agreement.  CODE STATUS: Full code  TOTAL TIME TAKING CARE OF THIS PATIENT: 38 minutes.   POSSIBLE D/C IN 1-2DAYS, DEPENDING ON CLINICAL CONDITION.  Other than 50% time spent in counseling, coordination of care Epifanio Lesches M.D on 01/10/2018 at 1:19 PM  Between 7am to 6pm - Pager - [phone]  After 6pm go to [email or web address] - password [email or web address] [facility]  Office  [phone]  CC: Primary care physician; Sinda Du, MD   Note: This dictation was prepared with Dragon dictation along with smaller phrase technology. Any transcriptional errors that result from this process are unintentional.

## 2018-01-11 MED ORDER — METHYLPREDNISOLONE SODIUM SUCC 40 MG IJ SOLR
40.0000 mg | Freq: Two times a day (BID) | INTRAMUSCULAR | Status: DC
  Administered 2018-01-11 – 2018-01-12 (×3): 40 mg via INTRAVENOUS
  Filled 2018-01-11 (×4): qty 1

## 2018-01-11 MED ORDER — MAGNESIUM HYDROXIDE 400 MG/5ML PO SUSP
30.0000 mL | Freq: Every day | ORAL | Status: DC | PRN
  Administered 2018-01-11: 30 mL via ORAL
  Filled 2018-01-11 (×2): qty 30

## 2018-01-11 MED ORDER — FLUCONAZOLE 100MG IVPB
100.0000 mg | INTRAVENOUS | Status: DC
  Administered 2018-01-12: 100 mg via INTRAVENOUS
  Filled 2018-01-11 (×3): qty 50

## 2018-01-11 MED ORDER — GUAIFENESIN-DM 100-10 MG/5ML PO SYRP
5.0000 mL | ORAL_SOLUTION | ORAL | Status: DC | PRN
  Administered 2018-01-11 – 2018-01-16 (×3): 5 mL via ORAL
  Filled 2018-01-11 (×3): qty 5

## 2018-01-11 NOTE — Progress Notes (Signed)
Patient found on Room Air, SAT at 83% but resting comfortably in bed not in distress. Patient stated her O2 SAT would be low, as she just finished going/walking to the bathroom. Patient placed back on [place] 4L, SAT at 92%. Breathing treatment given. Will continue to monitor.

## 2018-01-11 NOTE — Progress Notes (Signed)
Entered patient's room and she was not there, nor in the bathroom; Security called and they stated that patient was in the Visitors' entrance lobby talking on the phone; Requested that Security please go and ask patient to come back to her room; awaiting patient's return. Barbaraann Faster, RN 5:59 AM 01/11/2018

## 2018-01-11 NOTE — Progress Notes (Signed)
[place] at [place] NAME: Misty Green    MR#:  [id number]  DATE OF BIRTH:  December 01, 1970  SUBJECTIVE:  CHIEF COMPLAINT:   Chief Complaint  Patient presents with  . Abdominal Pain   Has left lower quadrant pain , manageable with current pain medications Tolerating soft diet REVIEW OF SYSTEMS:   ROS CONSTITUTIONAL: No fever, fatigue or weakness.  EYES: No blurred or double vision.  EARS, NOSE, AND THROAT: No tinnitus or ear pain.  RESPIRATORY: No cough, shortness of breath, wheezing or hemoptysis.  CARDIOVASCULAR: No chest pain, orthopnea, edema.  GASTROINTESTINAL: Nausea, left lower quadrant abdominal pain, GENITOURINARY: No dysuria, hematuria.  ENDOCRINE: No polyuria, nocturia,  HEMATOLOGY: No anemia, easy bruising or bleeding SKIN: No rash or lesion. MUSCULOSKELETAL: No joint pain or arthritis.   NEUROLOGIC: No tingling, numbness, weakness.  PSYCHIATRY: No anxiety or depression.   DRUG ALLERGIES:   Allergies  Allergen Reactions  . Other Nausea And Vomiting    Anti-depressent that starts with t    VITALS:  Blood pressure 121/84, pulse 76, temperature 97.7 F (36.5 C), temperature source Oral, resp. rate 20, height 5\' 3"  (1.6 m), weight 85 kg, SpO2 94 %.  PHYSICAL EXAMINATION:  GENERAL:  47 y.o.-year-old patient lying in the bed with no acute distress.  EYES: Pupils equal, round, reactive to light and accommodation. No scleral icterus. Extraocular muscles intact.  HEENT: Head atraumatic, normocephalic. Oropharynx and nasopharynx clear.  NECK:  Supple, no jugular venous distention. No thyroid enlargement, no tenderness.  LUNGS: Had expiratory wheeze in all lung's yesterday but mostly clear today. CARDIOVASCULAR: S1, S2 normal. No murmurs, rubs, or gallops.  ABDOMEN: Soft, lower abdominal tenderness is present no rebound tenderness, nondistended. Bowel sounds present.  EXTREMITIES: No pedal edema, cyanosis, or  clubbing.  NEUROLOGIC: Awake alert and oriented x3 sensation intact. Gait not checked.  PSYCHIATRIC: The patient is alert and oriented x 3.  SKIN: No obvious rash, lesion, or ulcer.    LABORATORY PANEL:   CBC Recent Labs  Lab 01/07/18 0555  WBC 4.7  HGB 12.4  HCT 41.4  PLT 179   ------------------------------------------------------------------------------------------------------------------  Chemistries  Recent Labs  Lab 01/06/18 1422 01/07/18 0555  NA 140 141  K 4.0 3.8  CL 102 105  CO2 29 32  GLUCOSE 87 92  BUN 6 6  CREATININE 0.38* 0.49  CALCIUM 9.3 8.3*  AST 23  --   ALT 17  --   ALKPHOS 41  --   BILITOT 0.5  --    ------------------------------------------------------------------------------------------------------------------  Cardiac Enzymes No results for input(s): TROPONINI in the last 168 hours. ------------------------------------------------------------------------------------------------------------------  RADIOLOGY:  No results found.  EKG:   Orders placed or performed during the hospital encounter of 03/21/16  . EKG 12-Lead  . EKG 12-Lead  . EKG 12-Lead  . EKG 12-Lead  . EKG 12-Lead    ASSESSMENT AND PLAN:   #1. recurrent acute sigmoid colon diverticulitis, failed antibiotic therapy, discussed with Dr. Perrin Maltese, patient tentatively scheduled for possible colectomy on Friday.  Continue IV antibiotics, IV and p.o. pain meds,  Morphine 2 mg IV as needed for severe pain, dose decreased from 4 mg because of her lethargy, concern about respiratory depression  Seen by Dr. [name], scheduled for surgery colectomy on Friday, plans to start bowel prep tomorrow  .  #2. COPD exacerbation: Clinically improving, continue IV steroids, bronchodilators.  History of chronic leg edema,  Lasix   .  History of neuropathy:  Continue Neurontin.    Depression, chronic pain issues: Is any suicidal ideation.  Continue current management of pain with IV morphine  and oxycodone as needed basis  All the records are reviewed and case discussed with Care Management/Social Workerr. Management plans discussed with the patient, she is in agreement   CODE STATUS: Full code  TOTAL TIME TAKING CARE OF THIS PATIENT: 35 minutes.   POSSIBLE D/C IN 1-2DAYS, DEPENDING ON CLINICAL CONDITION.  Other than 50% time spent in counseling, coordination of care Nicholes Mango M.D on 01/11/2018 at 1:48 PM  Between 7am to 6pm - Pager - [phone]  After 6pm go to [email or web address] - password [email or web address] [facility]  Office  [phone]  CC: Primary care physician; Sinda Du, MD   Note: This dictation was prepared with Dragon dictation along with smaller phrase technology. Any transcriptional errors that result from this process are unintentional.

## 2018-01-11 NOTE — Progress Notes (Addendum)
Supervisor and security notified that patient left the floor and went outside to smoke; AMA papers filled out and waiting for patient to return. Barbaraann Faster, RN 2:01 AM 01/11/2018  Told patient that writer was responsible for her welfare and that she has had multiple medications in her and that she needed to stay on the floor/in her room. Barbaraann Faster, RN 2:04 AM 01/11/2018

## 2018-01-11 NOTE — Progress Notes (Signed)
IV positional; patient complaining of not getting any rest; "I've had it, I'm going out to smoke".  Instructed patient that she could not smoke on this campus and that she could not go outside; upset; refusing Nicotine patch; IV team ordered for new IV, this IV was started by previous IV team. Barbaraann Faster, RN; 1:50 AM 01/11/2018

## 2018-01-11 NOTE — Progress Notes (Signed)
Pt is complaining of not being able to have a bowel movement. Pt refuses to take Miralax PRN and asking for milk of magnesium. On call surgeon notified, verbal orders to place PRN milk of magnesium.   Korin Hartwell [employer]

## 2018-01-11 NOTE — Progress Notes (Signed)
Visited the pt twice and was sound sleep.  Issues with chronic abd pain Apparently threatening to leave AMA last night AVSS We will continue plan for colectomy on Friday and will start bowel prep tomorrow

## 2018-01-11 NOTE — Progress Notes (Signed)
When RN went into pt.'s room to administer her 1400 antibiotics she was not in her room or bathroom.  Pt arrived to her room with her spouse around 1415 and smelled like cigarette smoke. RN educated pt that this is a smoke free facility and pt stated, " I know, I was not smoking." RN offered nicotine patch but pt is refusing it at this time. RN also enforced that she should not walk off the unit if she is in a lot of pain that she states that she is in, especially after PRN pain medications are given. RN will continue to monitor pt.   Jordanna Hendrie [employer]

## 2018-01-11 NOTE — Progress Notes (Signed)
Patient states she will self place home CPAP on when she is ready for bed. Patient advised to call RT for assistance if needed. Will monitor.

## 2018-01-11 NOTE — Progress Notes (Addendum)
O2 sat 83 on RA, O2 at 2Lpnc applied; instructed patient to leave [place] on. Voiced understanding; Hospitalist paged for patient request for cough medication. Awaiting callback. Barbaraann Faster, RN 4:58 AM 01/11/2018  Cancelled page; Robitussin ordered from previous electronic order. Barbaraann Faster, RN 5:01 AM 01/11/2018

## 2018-01-11 NOTE — Progress Notes (Signed)
RN has attempted to give pt her daily/morning medications but pt is still sleeping with CPAP on. RN will continue to attempt to administer her scheduled medications.   Misty Green [employer]

## 2018-01-11 NOTE — Progress Notes (Signed)
Patient back in room; complaining of not getting any sleep; wanting IV moved, told patient that IV has been ordered for placement of new IV site; "I've had it, I can't take any more". Barbaraann Faster, RN 2:13 AM 01/11/2018

## 2018-01-12 ENCOUNTER — Ambulatory Visit: Payer: Self-pay | Admitting: Surgery

## 2018-01-12 LAB — COMPREHENSIVE METABOLIC PANEL
ALBUMIN: 3.8 g/dL (ref 3.5–5.0)
ALT: 52 U/L — ABNORMAL HIGH (ref 0–44)
AST: 39 U/L (ref 15–41)
Alkaline Phosphatase: 40 U/L (ref 38–126)
Anion gap: 8 (ref 5–15)
BUN: 11 mg/dL (ref 6–20)
CO2: 41 mmol/L — ABNORMAL HIGH (ref 22–32)
CREATININE: 0.48 mg/dL (ref 0.44–1.00)
Calcium: 9.1 mg/dL (ref 8.9–10.3)
Chloride: 94 mmol/L — ABNORMAL LOW (ref 98–111)
GFR calc Af Amer: >60 mL/min (ref >60)
GFR calc non Af Amer: >60 mL/min (ref >60)
Glucose, Bld: 96 mg/dL (ref 70–99)
Potassium: 4.4 mmol/L (ref 3.5–5.1)
Sodium: 143 mmol/L (ref 135–145)
Total Bilirubin: 0.4 mg/dL (ref 0.3–1.2)
Total Protein: 7.4 g/dL (ref 6.5–8.1)

## 2018-01-12 LAB — CBC
HCT: 41.9 % (ref 36.0–46.0)
Hemoglobin: 12.4 g/dL (ref 12.0–15.0)
MCH: 30.8 pg (ref 26.0–34.0)
MCHC: 29.6 g/dL — ABNORMAL LOW (ref 30.0–36.0)
MCV: 104 fL — ABNORMAL HIGH (ref 80.0–100.0)
Platelets: 269 10*3/uL (ref 150–400)
RBC: 4.03 MIL/uL (ref 3.87–5.11)
RDW: 13.2 % (ref 11.5–15.5)
WBC: 8.5 10*3/uL (ref 4.0–10.5)
nRBC: 1.5 % — ABNORMAL HIGH (ref 0.0–0.2)

## 2018-01-12 LAB — PHOSPHORUS: Phosphorus: 4.1 mg/dL (ref 2.5–4.6)

## 2018-01-12 LAB — MAGNESIUM: Magnesium: 2.8 mg/dL — ABNORMAL HIGH (ref 1.7–2.4)

## 2018-01-12 MED ORDER — CHLORHEXIDINE GLUCONATE CLOTH 2 % EX PADS
6.0000 | MEDICATED_PAD | Freq: Every day | CUTANEOUS | Status: DC
  Administered 2018-01-12: 6 via TOPICAL

## 2018-01-12 MED ORDER — MAGNESIUM CITRATE PO SOLN
1.0000 | Freq: Once | ORAL | Status: AC
  Administered 2018-01-12: 1 via ORAL
  Filled 2018-01-12: qty 296

## 2018-01-12 MED ORDER — PEG 3350-KCL-NA BICARB-NACL 420 G PO SOLR
4000.0000 mL | Freq: Once | ORAL | Status: AC
  Administered 2018-01-12: 4000 mL via ORAL
  Filled 2018-01-12: qty 4000

## 2018-01-12 MED ORDER — BOOST / RESOURCE BREEZE PO LIQD CUSTOM
1.0000 | Freq: Three times a day (TID) | ORAL | Status: DC
  Administered 2018-01-12 – 2018-01-15 (×8): 1 via ORAL

## 2018-01-12 MED ORDER — SODIUM CHLORIDE 0.9 % IV SOLN
1.0000 g | Freq: Once | INTRAVENOUS | Status: AC
  Administered 2018-01-13: 1 g via INTRAVENOUS
  Filled 2018-01-12 (×3): qty 1

## 2018-01-12 NOTE — Care Management (Signed)
Barrier- for colectomy 12/27.  Clear liquids today

## 2018-01-12 NOTE — Progress Notes (Signed)
Patient self placed CPAP. Home CPAP is plugged into red outlet. No loose or frayed cords visible. 4L O2 bleed into CPAP. Patient resting comfortably in bed.

## 2018-01-12 NOTE — Progress Notes (Signed)
Patient drank all of the golytley and continued to have brown watery stools. Dr. [name] was notified and gave verbal order for 1 bottle of magnesium citrate. Will administer to patient once delivered from pharmacy.

## 2018-01-12 NOTE — Progress Notes (Signed)
CC: Diverticulitis Subjective: Pt still c/o pain, she continues to smoke and was having a sausage biscuit this am  Objective: Vital signs in last 24 hours: Temp:  [97.5 F (36.4 C)-98 F (36.7 C)] 97.6 F (36.4 C) (12/26 0449) Pulse Rate:  [67-81] 67 (12/26 0449) Resp:  [20] 20 (12/26 0449) BP: (113-139)/(67-82) 113/67 (12/26 0449) SpO2:  [83 %-94 %] 93 % (12/26 0449) Last BM Date: 01/11/18(very small per pt report; prior to today last BM 12/24)  Intake/Output from previous day: 12/25 0701 - 12/26 0700 In: 377.8 [I.V.:0.1; IV Piggyback:377.7] Out: 4700 [Urine:4700] Intake/Output this shift: Total I/O In: 220 [P.O.:220] Out: 600 [Urine:600]  Physical exam:  NAd, alert Abd: soft , nt, no peritonitis Ext; no edema  Lab Results: CBC  Recent Labs    01/12/18 0539  WBC 8.5  HGB 12.4  HCT 41.9  PLT 269   BMET Recent Labs    01/12/18 0539  NA 143  K 4.4  CL 94*  CO2 41*  GLUCOSE 96  BUN 11  CREATININE 0.48  CALCIUM 9.1   PT/INR No results for input(s): LABPROT, INR in the last 72 hours. ABG No results for input(s): PHART, HCO3 in the last 72 hours.  Invalid input(s): PCO2, PO2  Studies/Results: No results found.  Anti-infectives: Anti-infectives (From admission, onward)   Start     Dose/Rate Route Frequency Ordered Stop   01/13/18 0700  ertapenem (INVANZ) 1,000 mg in sodium chloride 0.9 % 100 mL IVPB    Note to Pharmacy:  On call to the OR tomorrow AM   1 g 200 mL/hr over 30 Minutes Intravenous  Once 01/12/18 0731     01/12/18 1800  fluconazole (DIFLUCAN) IVPB 100 mg     100 mg 50 mL/hr over 60 Minutes Intravenous Every 24 hours 01/11/18 1439     01/07/18 1400  fluconazole (DIFLUCAN) IVPB 100 mg  Status:  Discontinued     100 mg 50 mL/hr over 60 Minutes Intravenous Every 24 hours 01/07/18 1331 01/11/18 1439   01/07/18 0600  piperacillin-tazobactam (ZOSYN) IVPB 3.375 g     3.375 g 12.5 mL/hr over 240 Minutes Intravenous Every 8 hours 01/06/18 1852      01/06/18 1615  piperacillin-tazobactam (ZOSYN) IVPB 3.375 g     3.375 g 100 mL/hr over 30 Minutes Intravenous  Once 01/06/18 1614 01/06/18 1820      Assessment/Plan:  Chronic diverticulitis Bowel prep today and colectomy in am Despite orders ( clears) I witnessed the pt eating a sausage Biscuit from [employer] I Had a lengthy d/w th pt about the procedure, risks ,benefits and possible complications including but not limited to bleeding, leak, need for ostomy.   Caroleen Hamman, MD, [facility]  01/12/2018

## 2018-01-12 NOTE — Progress Notes (Signed)
[place] at [place] NAME: Rhodie Cienfuegos    MR#:  [id number]  DATE OF BIRTH:  Oct 05, 1970  SUBJECTIVE:  CHIEF COMPLAINT:   Chief Complaint  Patient presents with  . Abdominal Pain   Has left lower quadrant pain , manageable with current pain medications Scheduled for surgery tomorrow for recurrent diverticulitis REVIEW OF SYSTEMS:   ROS CONSTITUTIONAL: No fever, fatigue or weakness.  EYES: No blurred or double vision.  EARS, NOSE, AND THROAT: No tinnitus or ear pain.  RESPIRATORY: No cough, shortness of breath, wheezing or hemoptysis.  CARDIOVASCULAR: No chest pain, orthopnea, edema.  GASTROINTESTINAL: Nausea, left lower quadrant abdominal pain, GENITOURINARY: No dysuria, hematuria.  ENDOCRINE: No polyuria, nocturia,  HEMATOLOGY: No anemia, easy bruising or bleeding SKIN: No rash or lesion. MUSCULOSKELETAL: No joint pain or arthritis.   NEUROLOGIC: No tingling, numbness, weakness.  PSYCHIATRY: No anxiety or depression.   DRUG ALLERGIES:   Allergies  Allergen Reactions  . Other Nausea And Vomiting    Anti-depressent that starts with t    VITALS:  Blood pressure 113/67, pulse 67, temperature 97.6 F (36.4 C), temperature source Oral, resp. rate 20, height 5\' 3"  (1.6 m), weight 85 kg, SpO2 93 %.  PHYSICAL EXAMINATION:  GENERAL:  47 y.o.-year-old patient lying in the bed with no acute distress.  EYES: Pupils equal, round, reactive to light and accommodation. No scleral icterus. Extraocular muscles intact.  HEENT: Head atraumatic, normocephalic. Oropharynx and nasopharynx clear.  NECK:  Supple, no jugular venous distention. No thyroid enlargement, no tenderness.  LUNGS: Had expiratory wheeze in all lung's yesterday but mostly clear today. CARDIOVASCULAR: S1, S2 normal. No murmurs, rubs, or gallops.  ABDOMEN: Soft, lower abdominal tenderness is present no rebound tenderness, nondistended. Bowel sounds present.   EXTREMITIES: No pedal edema, cyanosis, or clubbing.  NEUROLOGIC: Awake alert and oriented x3 sensation intact. Gait not checked.  PSYCHIATRIC: The patient is alert and oriented x 3.  SKIN: No obvious rash, lesion, or ulcer.    LABORATORY PANEL:   CBC Recent Labs  Lab 01/12/18 0539  WBC 8.5  HGB 12.4  HCT 41.9  PLT 269   ------------------------------------------------------------------------------------------------------------------  Chemistries  Recent Labs  Lab 01/12/18 0539  NA 143  K 4.4  CL 94*  CO2 41*  GLUCOSE 96  BUN 11  CREATININE 0.48  CALCIUM 9.1  MG 2.8*  AST 39  ALT 52*  ALKPHOS 40  BILITOT 0.4   ------------------------------------------------------------------------------------------------------------------  Cardiac Enzymes No results for input(s): TROPONINI in the last 168 hours. ------------------------------------------------------------------------------------------------------------------  RADIOLOGY:  No results found.  EKG:   Orders placed or performed during the hospital encounter of 03/21/16  . EKG 12-Lead  . EKG 12-Lead  . EKG 12-Lead  . EKG 12-Lead  . EKG 12-Lead    ASSESSMENT AND PLAN:   #1. recurrent acute sigmoid colon diverticulitis, failed antibiotic therapy, discussed with Dr. Perrin Maltese, patient scheduled for  colectomy on Friday.  On clear liquid diet and n.p.o. after midnight  continue IV antibiotics, IV and p.o. pain meds,  Morphine 2 mg IV as needed for severe pain, dose decreased from 4 mg because of her lethargy, concern about respiratory depression  Seen by Dr. [name], scheduled for surgery colectomy on Friday, plans to start bowel prep tomorrow  .  #2. COPD exacerbation: Clinically improving, continue IV steroids, bronchodilators.  History of chronic leg edema,  Lasix   .  History of neuropathy: Continue Neurontin.    Depression, chronic  pain issues: Is any suicidal ideation.  Continue current management of  pain with IV morphine and oxycodone as needed basis  All the records are reviewed and case discussed with Care Management/Social Workerr. Management plans discussed with the patient, she is in agreement   CODE STATUS: Full code  TOTAL TIME TAKING CARE OF THIS PATIENT: 35 minutes.   POSSIBLE D/C IN 1-2DAYS, DEPENDING ON CLINICAL CONDITION.  Other than 50% time spent in counseling, coordination of care Nicholes Mango M.D on 01/12/2018 at 1:32 PM  Between 7am to 6pm - Pager - [phone]  After 6pm go to [email or web address] - password [email or web address] [facility]  Office  [phone]  CC: Primary care physician; Sinda Du, MD   Note: This dictation was prepared with Dragon dictation along with smaller phrase technology. Any transcriptional errors that result from this process are unintentional.

## 2018-01-12 NOTE — Progress Notes (Signed)
Nutrition Follow Up Note   DOCUMENTATION CODES:   Obesity unspecified  INTERVENTION:   Boost Breeze po TID, each supplement provides 250 kcal and 9 grams of protein  Continue daily MVI  NUTRITION DIAGNOSIS:   Inadequate oral intake related to decreased appetite, acute illness(diverticulitis) as evidenced by per patient/family report.  GOAL:   Patient will meet greater than or equal to 90% of their needs   MONITOR:   PO intake, Supplement acceptance, Labs, Weight trends, I & O's  ASSESSMENT:   47 year old female with PMHx of COPD, opiate abuse, and PSHx of back surgery, C-section, abdominal hysterectomy, incisional hernia repair, laparoscopic cholecystectomy who is now admitted for recurrent sigmoid colon diverticulitis now s/p 3 rounds of antibiotics.   Pt with good appetite and oral intake since admit. Pt on clear liquid diet today for scheduled colectomy tomorrow. Pt ate 100% of her clear liquid tray for breakfast. Will change Ensure over to [facility] as this is a clear supplement. Recommend continue MVI. Pt with new PNA and bilateral leg swelling; initiated on lasix. No new weight since admit; will request daily weights. Pt with 4774ml UOP x 24 hrs and -9.8L on I & O's. RD will monitor for diet advancement post op.   Medications reviewed and include: Lovenox, lasix, solu-medrol, MVI, nicotine, protonix, vitamin C, diflucan, zosyn, morphine, oxycodone   Labs reviewed: K 4.4 wnl, P 4.1 wnl, Mg 2.8(H)  -Diet Order:   Diet Order            Diet NPO time specified  Diet effective midnight        Diet clear liquid Room service appropriate? Yes; Fluid consistency: Thin  Diet effective now             EDUCATION NEEDS:   Education needs have been addressed  Skin:  Skin Assessment: Reviewed RN Assessment  Last BM:  12/25- type 6  Height:   Ht Readings from Last 1 Encounters:  01/06/18 5\' 3"  (1.6 m)   Weight:   Wt Readings from Last 1 Encounters:  01/06/18 85  kg   Ideal Body Weight:  52.3 kg  BMI:  Body mass index is 33.2 kg/m.  Estimated Nutritional Needs:   Kcal:  1740-2030 (MSJ x 1.2-1.4)  Protein:  85-95 grams (1-1.1 grams/kg)  Fluid:  1.7-2 L/day (1 mL/kcal)  Koleen Distance MS, RD, LDN Pager #- [phone] Office#- [phone] After Hours Pager: [phone]

## 2018-01-13 ENCOUNTER — Inpatient Hospital Stay: Payer: 59 | Admitting: Anesthesiology

## 2018-01-13 ENCOUNTER — Encounter: Payer: Self-pay | Admitting: Certified Registered Nurse Anesthetist

## 2018-01-13 ENCOUNTER — Encounter
Admission: EM | Disposition: A | Discharge status: Home / Self Care | Payer: Self-pay | Source: Home / Self Care | Attending: Internal Medicine

## 2018-01-13 DIAGNOSIS — K5792 Diverticulitis of intestine, part unspecified, without perforation or abscess without bleeding: Secondary | ICD-10-CM

## 2018-01-13 HISTORY — PX: LAPAROSCOPIC SIGMOID COLECTOMY: SHX5928

## 2018-01-13 HISTORY — PX: COLOSTOMY: SHX63

## 2018-01-13 HISTORY — PX: LAPAROSCOPIC LYSIS OF ADHESIONS: SHX5905

## 2018-01-13 LAB — URINE DRUG SCREEN, QUALITATIVE (ARMC ONLY)
Amphetamines, Ur Screen: NOT DETECTED
BARBITURATES, UR SCREEN: NOT DETECTED
Benzodiazepine, Ur Scrn: POSITIVE — AB
Cannabinoid 50 Ng, Ur ~~LOC~~: NOT DETECTED
Cocaine Metabolite,Ur ~~LOC~~: NOT DETECTED
MDMA (Ecstasy)Ur Screen: NOT DETECTED
METHADONE SCREEN, URINE: NOT DETECTED
Opiate, Ur Screen: POSITIVE — AB
Phencyclidine (PCP) Ur S: NOT DETECTED
TRICYCLIC, UR SCREEN: NOT DETECTED

## 2018-01-13 SURGERY — LYSIS, ADHESIONS, LAPAROSCOPIC
Anesthesia: General

## 2018-01-13 MED ORDER — SODIUM CHLORIDE (PF) 0.9 % IJ SOLN
INTRAMUSCULAR | Status: AC
  Filled 2018-01-13: qty 50

## 2018-01-13 MED ORDER — DEXMEDETOMIDINE HCL 200 MCG/2ML IV SOLN
INTRAVENOUS | Status: DC | PRN
  Administered 2018-01-13: 8 ug via INTRAVENOUS
  Administered 2018-01-13: 4 ug via INTRAVENOUS
  Administered 2018-01-13: 8 ug via INTRAVENOUS

## 2018-01-13 MED ORDER — PROPOFOL 10 MG/ML IV BOLUS
INTRAVENOUS | Status: DC | PRN
  Administered 2018-01-13: 150 mg via INTRAVENOUS

## 2018-01-13 MED ORDER — ONDANSETRON HCL 4 MG/2ML IJ SOLN: 4.0000 mg | Freq: Once | INTRAMUSCULAR | Status: DC | PRN

## 2018-01-13 MED ORDER — ACETAMINOPHEN 10 MG/ML IV SOLN
INTRAVENOUS | Status: AC
  Filled 2018-01-13: qty 100

## 2018-01-13 MED ORDER — FENTANYL CITRATE (PF) 100 MCG/2ML IJ SOLN
INTRAMUSCULAR | Status: AC
  Administered 2018-01-13: 25 ug via INTRAVENOUS
  Filled 2018-01-13: qty 2

## 2018-01-13 MED ORDER — DEXAMETHASONE SODIUM PHOSPHATE 10 MG/ML IJ SOLN
INTRAMUSCULAR | Status: AC
  Filled 2018-01-13: qty 1

## 2018-01-13 MED ORDER — ONDANSETRON HCL 4 MG/2ML IJ SOLN
INTRAMUSCULAR | Status: DC | PRN
  Administered 2018-01-13: 4 mg via INTRAVENOUS

## 2018-01-13 MED ORDER — KETAMINE HCL 50 MG/ML IJ SOLN
INTRAMUSCULAR | Status: AC
  Filled 2018-01-13: qty 10

## 2018-01-13 MED ORDER — METHYLPREDNISOLONE SODIUM SUCC 125 MG IJ SOLR
60.0000 mg | Freq: Three times a day (TID) | INTRAMUSCULAR | Status: DC
  Administered 2018-01-13 – 2018-01-14 (×2): 60 mg via INTRAVENOUS
  Filled 2018-01-13: qty 2

## 2018-01-13 MED ORDER — ROCURONIUM BROMIDE 50 MG/5ML IV SOLN
INTRAVENOUS | Status: AC
  Filled 2018-01-13: qty 1

## 2018-01-13 MED ORDER — IPRATROPIUM-ALBUTEROL 0.5-2.5 (3) MG/3ML IN SOLN
RESPIRATORY_TRACT | Status: AC
  Administered 2018-01-13: 3 mL via RESPIRATORY_TRACT
  Filled 2018-01-13: qty 3

## 2018-01-13 MED ORDER — BUPIVACAINE-EPINEPHRINE (PF) 0.25% -1:200000 IJ SOLN
INTRAMUSCULAR | Status: AC
  Filled 2018-01-13: qty 30

## 2018-01-13 MED ORDER — FENTANYL CITRATE (PF) 100 MCG/2ML IJ SOLN
INTRAMUSCULAR | Status: DC | PRN
  Administered 2018-01-13: 50 ug via INTRAVENOUS
  Administered 2018-01-13: 100 ug via INTRAVENOUS
  Administered 2018-01-13: 50 ug via INTRAVENOUS
  Administered 2018-01-13: 100 ug via INTRAVENOUS
  Administered 2018-01-13 (×3): 50 ug via INTRAVENOUS
  Administered 2018-01-13 (×2): 100 ug via INTRAVENOUS

## 2018-01-13 MED ORDER — BUPIVACAINE-EPINEPHRINE (PF) 0.25% -1:200000 IJ SOLN
INTRAMUSCULAR | Status: DC | PRN
  Administered 2018-01-13: 30 mL via PERINEURAL

## 2018-01-13 MED ORDER — SUGAMMADEX SODIUM 200 MG/2ML IV SOLN
INTRAVENOUS | Status: DC | PRN
  Administered 2018-01-13 (×2): 200 mg via INTRAVENOUS

## 2018-01-13 MED ORDER — ONDANSETRON HCL 4 MG/2ML IJ SOLN
INTRAMUSCULAR | Status: AC
  Filled 2018-01-13: qty 2

## 2018-01-13 MED ORDER — IPRATROPIUM-ALBUTEROL 0.5-2.5 (3) MG/3ML IN SOLN
3.0000 mL | Freq: Once | RESPIRATORY_TRACT | Status: AC
  Administered 2018-01-13: 3 mL via RESPIRATORY_TRACT

## 2018-01-13 MED ORDER — MIDAZOLAM HCL 2 MG/2ML IJ SOLN
INTRAMUSCULAR | Status: DC | PRN
  Administered 2018-01-13: 2 mg via INTRAVENOUS

## 2018-01-13 MED ORDER — OXYCODONE HCL 5 MG PO TABS
10.0000 mg | ORAL_TABLET | ORAL | Status: DC | PRN
  Administered 2018-01-13 – 2018-01-17 (×15): 10 mg via ORAL
  Filled 2018-01-13 (×16): qty 2

## 2018-01-13 MED ORDER — LIDOCAINE HCL (CARDIAC) PF 100 MG/5ML IV SOSY
PREFILLED_SYRINGE | INTRAVENOUS | Status: DC | PRN
  Administered 2018-01-13: 100 mg via INTRAVENOUS

## 2018-01-13 MED ORDER — MORPHINE SULFATE (PF) 4 MG/ML IV SOLN
4.0000 mg | INTRAVENOUS | Status: DC | PRN
  Administered 2018-01-13 – 2018-01-15 (×6): 4 mg via INTRAVENOUS
  Filled 2018-01-13 (×7): qty 1

## 2018-01-13 MED ORDER — PROPOFOL 10 MG/ML IV BOLUS
INTRAVENOUS | Status: AC
  Filled 2018-01-13: qty 20

## 2018-01-13 MED ORDER — LACTATED RINGERS IV SOLN
INTRAVENOUS | Status: DC
  Administered 2018-01-13 – 2018-01-14 (×4): via INTRAVENOUS

## 2018-01-13 MED ORDER — FENTANYL CITRATE (PF) 250 MCG/5ML IJ SOLN
INTRAMUSCULAR | Status: AC
  Filled 2018-01-13: qty 5

## 2018-01-13 MED ORDER — KETAMINE HCL 50 MG/ML IJ SOLN
INTRAMUSCULAR | Status: DC | PRN
  Administered 2018-01-13: 50 mg via INTRAMUSCULAR

## 2018-01-13 MED ORDER — FENTANYL CITRATE (PF) 100 MCG/2ML IJ SOLN
INTRAMUSCULAR | Status: AC
  Filled 2018-01-13: qty 2

## 2018-01-13 MED ORDER — ROCURONIUM BROMIDE 100 MG/10ML IV SOLN
INTRAVENOUS | Status: DC | PRN
  Administered 2018-01-13: 50 mg via INTRAVENOUS
  Administered 2018-01-13 (×2): 20 mg via INTRAVENOUS
  Administered 2018-01-13 (×2): 50 mg via INTRAVENOUS
  Administered 2018-01-13: 10 mg via INTRAVENOUS
  Administered 2018-01-13: 20 mg via INTRAVENOUS
  Administered 2018-01-13: 10 mg via INTRAVENOUS
  Administered 2018-01-13: 20 mg via INTRAVENOUS

## 2018-01-13 MED ORDER — PHENYLEPHRINE HCL 10 MG/ML IJ SOLN
INTRAMUSCULAR | Status: DC | PRN
  Administered 2018-01-13 (×2): 100 ug via INTRAVENOUS

## 2018-01-13 MED ORDER — ALBUTEROL SULFATE HFA 108 (90 BASE) MCG/ACT IN AERS
INHALATION_SPRAY | RESPIRATORY_TRACT | Status: DC | PRN
  Administered 2018-01-13 (×3): 8 via RESPIRATORY_TRACT

## 2018-01-13 MED ORDER — ACETAMINOPHEN 10 MG/ML IV SOLN
INTRAVENOUS | Status: DC | PRN
  Administered 2018-01-13: 1000 mg via INTRAVENOUS

## 2018-01-13 MED ORDER — SODIUM CHLORIDE 0.9 % IV SOLN
INTRAVENOUS | Status: DC | PRN
  Administered 2018-01-13: 70 mL

## 2018-01-13 MED ORDER — LIDOCAINE HCL 4 % MT SOLN
OROMUCOSAL | Status: DC | PRN
  Administered 2018-01-13: 4 mL via TOPICAL

## 2018-01-13 MED ORDER — SUGAMMADEX SODIUM 200 MG/2ML IV SOLN
INTRAVENOUS | Status: AC
  Filled 2018-01-13: qty 2

## 2018-01-13 MED ORDER — LACTATED RINGERS IV SOLN
INTRAVENOUS | Status: DC | PRN
  Administered 2018-01-13 (×2): via INTRAVENOUS

## 2018-01-13 MED ORDER — LIDOCAINE HCL (PF) 2 % IJ SOLN
INTRAMUSCULAR | Status: AC
  Filled 2018-01-13: qty 10

## 2018-01-13 MED ORDER — DEXAMETHASONE SODIUM PHOSPHATE 10 MG/ML IJ SOLN
INTRAMUSCULAR | Status: DC | PRN
  Administered 2018-01-13: 8 mg via INTRAVENOUS

## 2018-01-13 MED ORDER — MIDAZOLAM HCL 2 MG/2ML IJ SOLN
INTRAMUSCULAR | Status: AC
  Filled 2018-01-13: qty 2

## 2018-01-13 MED ORDER — DEXMEDETOMIDINE HCL IN NACL 200 MCG/50ML IV SOLN
INTRAVENOUS | Status: AC
  Filled 2018-01-13: qty 50

## 2018-01-13 MED ORDER — PREDNISONE 50 MG PO TABS
50.0000 mg | ORAL_TABLET | Freq: Every day | ORAL | Status: DC
  Administered 2018-01-14 – 2018-01-15 (×2): 50 mg via ORAL
  Filled 2018-01-13 (×3): qty 1

## 2018-01-13 MED ORDER — FENTANYL CITRATE (PF) 100 MCG/2ML IJ SOLN
25.0000 ug | INTRAMUSCULAR | Status: AC | PRN
  Administered 2018-01-13 (×6): 25 ug via INTRAVENOUS

## 2018-01-13 SURGICAL SUPPLY — 60 items
ADH SKN CLS APL DERMABOND .7 (GAUZE/BANDAGES/DRESSINGS) ×4
BLADE SURG SZ10 CARB STEEL (BLADE) ×4 IMPLANT
BULB RESERV EVAC DRAIN JP 100C (MISCELLANEOUS) ×3 IMPLANT
CANISTER SUCT 1200ML W/VALVE (MISCELLANEOUS) ×4 IMPLANT
COVER WAND RF STERILE (DRAPES) ×4 IMPLANT
DECANTER SPIKE VIAL GLASS SM (MISCELLANEOUS) ×4 IMPLANT
DERMABOND ADVANCED (GAUZE/BANDAGES/DRESSINGS) ×4
DERMABOND ADVANCED .7 DNX12 (GAUZE/BANDAGES/DRESSINGS) ×4 IMPLANT
DRAIN CHANNEL JP 19F (MISCELLANEOUS) ×3 IMPLANT
DRAPE INCISE IOBAN 66X45 STRL (DRAPES) ×4 IMPLANT
ELECT BLADE 6.5 EXT (BLADE) ×3 IMPLANT
ELECT CAUTERY BLADE 6.4 (BLADE) ×4 IMPLANT
ELECT REM PT RETURN 9FT ADLT (ELECTROSURGICAL) ×4
ELECTRODE REM PT RTRN 9FT ADLT (ELECTROSURGICAL) ×2 IMPLANT
GLOVE BIO SURGEON STRL SZ7 (GLOVE) ×8 IMPLANT
GOWN STRL REUS W/ TWL LRG LVL3 (GOWN DISPOSABLE) ×8 IMPLANT
GOWN STRL REUS W/TWL LRG LVL3 (GOWN DISPOSABLE) ×16
HANDLE SUCTION POOLE (INSTRUMENTS) ×2 IMPLANT
HANDLE YANKAUER SUCT BULB TIP (MISCELLANEOUS) ×4 IMPLANT
HOLDER FOLEY CATH W/STRAP (MISCELLANEOUS) ×4 IMPLANT
NEEDLE HYPO 22GX1.5 SAFETY (NEEDLE) ×4 IMPLANT
NS IRRIG 1000ML POUR BTL (IV SOLUTION) ×4 IMPLANT
PACK COLON CLEAN CLOSURE (MISCELLANEOUS) ×4 IMPLANT
PACK LAP CHOLECYSTECTOMY (MISCELLANEOUS) ×4 IMPLANT
PENCIL ELECTRO HAND CTR (MISCELLANEOUS) ×4 IMPLANT
RELOAD STAPLE 60 2.6 WHT THN (STAPLE) ×2 IMPLANT
RELOAD STAPLE 60 3.6 BLU REG (STAPLE) IMPLANT
RELOAD STAPLE 60 4.1 GRN THCK (STAPLE) IMPLANT
RELOAD STAPLER BLUE 60MM (STAPLE) ×6 IMPLANT
RELOAD STAPLER GREEN 60MM (STAPLE) ×2 IMPLANT
RELOAD STAPLER WHITE 60MM (STAPLE) ×4 IMPLANT
RETRACTOR WOUND ALXS 18CM MED (MISCELLANEOUS) ×2 IMPLANT
RTRCTR WOUND ALEXIS O 18CM MED (MISCELLANEOUS) ×4
SHEARS HARMONIC ACE PLUS 36CM (ENDOMECHANICALS) ×4 IMPLANT
SLEEVE ENDOPATH XCEL 5M (ENDOMECHANICALS) IMPLANT
SPONGE LAP 18X18 RF (DISPOSABLE) ×20 IMPLANT
SPONGE LAP 18X36 RFD (DISPOSABLE) ×3 IMPLANT
STAPLE ECHEON FLEX 60 POW ENDO (STAPLE) ×4 IMPLANT
STAPLER RELOAD BLUE 60MM (STAPLE) ×12
STAPLER RELOAD GREEN 60MM (STAPLE) ×4
STAPLER RELOAD WHITE 60MM (STAPLE) ×8
SUCTION POOLE HANDLE (INSTRUMENTS) ×4
SUT MNCRL 4-0 (SUTURE) ×8
SUT MNCRL 4-0 27XMFL (SUTURE) ×4
SUT PDS AB 0 CT1 27 (SUTURE) ×8 IMPLANT
SUT SILK 2 0 (SUTURE) ×4
SUT SILK 2 0 SH CR/8 (SUTURE) ×4 IMPLANT
SUT SILK 2-0 30XBRD TIE 12 (SUTURE) ×2 IMPLANT
SUT VIC AB 3-0 SH 27 (SUTURE) ×28
SUT VIC AB 3-0 SH 27X BRD (SUTURE) ×7 IMPLANT
SUT VICRYL 0 AB UR-6 (SUTURE) ×8 IMPLANT
SUTURE MNCRL 4-0 27XMF (SUTURE) ×4 IMPLANT
SYR 30ML LL (SYRINGE) ×4 IMPLANT
SYS LAPSCP GELPORT 120MM (MISCELLANEOUS) ×4
SYSTEM LAPSCP GELPORT 120MM (MISCELLANEOUS) ×2 IMPLANT
TRAY FOLEY MTR SLVR 16FR STAT (SET/KITS/TRAYS/PACK) ×4 IMPLANT
TROCAR XCEL 12X100 BLDLESS (ENDOMECHANICALS) ×4 IMPLANT
TROCAR XCEL BLUNT TIP 100MML (ENDOMECHANICALS) ×4 IMPLANT
TROCAR XCEL NON-BLD 5MMX100MML (ENDOMECHANICALS) ×4 IMPLANT
TUBING INSUF HEATED (TUBING) ×8 IMPLANT

## 2018-01-13 NOTE — Progress Notes (Signed)
01/13/2018 1300  Received a call from patient's daughter who revealed that another family member witnessed the patient take Xanax from her purse, crush and snort it with a straw.  Notified AC Jackie and prepared to speak with patient on the matter after she returned from surgery.  The daughter spoke with me again shortly after the surgery that her brother had removed the patient's home medicines from her purse and took it home.  Notified Dr. [name] of this.  No further intervention ordered at this time.  Will continue to monitor and assess patient. Dola Argyle, RN

## 2018-01-13 NOTE — Op Note (Signed)
PROCEDURES: 1. Laparoscopic lysis of adhesions taking at least 90 minutes of operative time 2. Laparoscopic Low anterior resection 3. Laparoscopic takedown of splenic flexure 4. End colostomy  Pre-operative Diagnosis: Chronic severe diverticulitis  Post-operative Diagnosis: Same  Surgeon: Marjory Lies Edna Grover   Assistants: Dr. Rosana Hoes ( required for exposure and due to the complexity of the case  Anesthesia: General endotracheal anesthesia  ASA Class: 3   Surgeon: Caroleen Hamman , MD FACS  Anesthesia: Gen. with endotracheal tube  Findings: Severe sigmoid diverticulitis with dense adhesions from the sigmoid to the pelvis and the small bowel to the abd wall Identification of both ureters and preservation  Estimated Blood Loss: 200cc         Drains: 19 Fr blake drain         Specimens: colon          Complications: none                Condition: stble  Procedure Details  The patient was seen again in the Holding Room. The benefits, complications, treatment options, and expected outcomes were discussed with the patient. The risks of bleeding, infection, recurrence of symptoms, failure to resolve symptoms,  bowel injury, any of which could require further surgery were reviewed with the patient.   The patient was taken to Operating Room, identified as Misty Green and the procedure verified.  A Time Out was held and the above information confirmed. The pt presented with diverticulitis not responsive to medical rx, multiple comorbidities to include obesity, copd, active smoker and currently on steroids.  Prior to the induction of general anesthesia, antibiotic prophylaxis was administered. VTE prophylaxis was in place. General endotracheal anesthesia was then administered and tolerated well. After the induction, the abdomen was prepped with Chloraprep and draped in the sterile fashion. The patient was positioned in the supine position. Prior to the induction of general anesthesia,  antibiotic prophylaxis was administered. VTE prophylaxis was in place. General endotracheal anesthesia was then administered and tolerated well. After the induction, the abdomen was prepped with Chloraprep and draped in the sterile fashion. The patient was positioned in lithotomy position. 7 cm incision was created as a midline mini laparotomy. The abdominal cavity was entered under direct visualization and the GelPort device was placed. A 5 mm port was placed in the suprapubic area under direct visualization and pneumoperitoneum was obtained. There were dense adhesions from the omentum to the abdominal wall that where lysed in the standard fashion with the Harmonic scalpel. We also were able to place a 12 mm port in the right lower quadrant and a 5 mm port in the left lower quadrant under direct visualization. There was significant adhesive disease in the pelvis from the sigmoid to the pelvic wall and also from the sigmoid to the small bowel. These adhesions were lysed with a combination of finger fracturing and Harmonic scalpel. The white line of pot was identified and divided and we mobilized the descending colon IN a lateral to medial fashion. We preserved the ureter at all times. We were also able to mobilize the splenic flexure using Harmonic scalpel in the standard fashion. We identified the takeoff of the inferior mesenteric artery dissected the pedicle and divided using a 60 mm vascular echelon stapler in the standard fashion. Using the Harmonic's scalpel were able to divide the mesorectum and and also divided proximal to the mesentery of the descending colon. We score the rectum and dissected free from the pelvis and peritoneal resection  to have a good distal margin due to significant inflammation on upper rectum and dital sigmoid. Once we have an adequate visualization and mobilization we divided the sigmoid colon distally at the junction of the rectosigmoid area with multiple blue loads using the  echelon stapler. We removed the GelPort and visualized the colon in a direct fashion. An divided at the mid descending colon with standard 60 mm blue load.    There was also adequate hemostasis. A 19 Blake drain was placed in the pelvis.  Due to extensive inflammation and comorbidities I decided not to do an anastomosis.  A defect was created for the end colostomy on the Left lowe quadrant and the end colon was brought through the defect.    All the laparoscopic ports were removed and a second look showed no evidence of any bleeding or any other injuries. We changed gloves and place a new tray to close the abdomen with a -0 PDS suture in a running fashion and the skin was closed with staples. Liposomal Marcaine was injected on all incision sites under direct visualization. We matured the colostomy with interrupted 3-0 vicryl sutures, in a rosebud fashion. Ostomy appliance placed.   Needle and laparotomy count were correct and there were no immediate occasions  Caroleen Hamman, MD, FACS

## 2018-01-13 NOTE — Anesthesia Post-op Follow-up Note (Signed)
Anesthesia QCDR form completed.        

## 2018-01-13 NOTE — Progress Notes (Signed)
Placed back on 2l after neb tx

## 2018-01-13 NOTE — Anesthesia Preprocedure Evaluation (Signed)
Anesthesia Evaluation  Patient identified by MRN, date of birth, ID band Patient awake    Reviewed: Allergy & Precautions, H&P , NPO status , Patient's Chart, lab work & pertinent test results, reviewed documented beta blocker date and time   Airway Mallampati: II  TM Distance: >3 FB Neck ROM: full    Dental  (+) Teeth Intact   Pulmonary neg pulmonary ROS, sleep apnea and Continuous Positive Airway Pressure Ventilation , COPD, Current Smoker,    Pulmonary exam normal        Cardiovascular Exercise Tolerance: Poor negative cardio ROS Normal cardiovascular exam Rhythm:regular Rate:Normal     Neuro/Psych PSYCHIATRIC DISORDERS Anxiety Depression negative neurological ROS  negative psych ROS   GI/Hepatic negative GI ROS, Neg liver ROS,   Endo/Other  negative endocrine ROS  Renal/GU negative Renal ROS  negative genitourinary   Musculoskeletal   Abdominal   Peds  Hematology negative hematology ROS (+)   Anesthesia Other Findings Past Medical History: No date: COPD (chronic obstructive pulmonary disease) (HCC) No date: Current smoker No date: Opiate abuse, continuous ([place]) Past Surgical History: No date: ABDOMINAL HYSTERECTOMY No date: BACK SURGERY No date: cesction No date: INCISIONAL HERNIA REPAIR     Comment:  lower midline laparotomy incision (hysterectomy),               repaired with mesh 2012: LAPAROSCOPIC CHOLECYSTECTOMY 08/25/2015: ORIF WRIST FRACTURE; Right     Comment:  Procedure: OPEN REDUCTION INTERNAL FIXATION (ORIF) WRIST              FRACTURE;  Surgeon: Corky Mull, MD;  Location: [facility]               ORS;  Service: Orthopedics;  Laterality: Right; BMI    Body Mass Index:  36.79 kg/m     Reproductive/Obstetrics negative OB ROS                             Anesthesia Physical Anesthesia Plan  ASA: III  Anesthesia Plan: General ETT   Post-op Pain Management:     Induction:   PONV Risk Score and Plan:   Airway Management Planned:   Additional Equipment:   Intra-op Plan:   Post-operative Plan:   Informed Consent: I have reviewed the patients History and Physical, chart, labs and discussed the procedure including the risks, benefits and alternatives for the proposed anesthesia with the patient or authorized representative who has indicated his/her understanding and acceptance.   Dental Advisory Given  Plan Discussed with: CRNA  Anesthesia Plan Comments:         Anesthesia Quick Evaluation

## 2018-01-13 NOTE — Anesthesia Procedure Notes (Signed)
Procedure Name: Intubation Date/Time: 01/13/2018 10:15 AM Performed by: Eben Burow, CRNA Pre-anesthesia Checklist: Patient identified, Emergency Drugs available, Suction available and Patient being monitored Patient Re-evaluated:Patient Re-evaluated prior to induction Oxygen Delivery Method: Circle system utilized Preoxygenation: Pre-oxygenation with 100% oxygen Induction Type: IV induction Ventilation: Mask ventilation without difficulty Laryngoscope Size: Mac and 3 Grade View: Grade I Tube type: Oral Tube size: 7.0 mm Number of attempts: 1 Airway Equipment and Method: Stylet and LTA kit utilized Placement Confirmation: ETT inserted through vocal cords under direct vision,  positive ETCO2 and breath sounds checked- equal and bilateral Secured at: 21 cm Tube secured with: Tape Dental Injury: Teeth and Oropharynx as per pre-operative assessment

## 2018-01-13 NOTE — Progress Notes (Signed)
Received a call from the nurse in PACU at 1630 requesting that the patient's CPAP be brought to PACU. I took the patient's home CPAP from room 215 to the PACU and put the patient on it. The patient is on her home CPAP with a 3L oxygen bleed in. Patient's oxygen saturation is 94% on CPAP.

## 2018-01-13 NOTE — Progress Notes (Signed)
Patient stated she would self place home CPAP. Home unit is plugged into red outlet, no loose or frayed cords visible. Patient advised to have Respiratory paged if assistance was needed.

## 2018-01-13 NOTE — Progress Notes (Signed)
Family updated from PACU.

## 2018-01-13 NOTE — Anesthesia Postprocedure Evaluation (Signed)
Anesthesia Post Note  Patient: Jaya Lapka Conway  Procedure(s) Performed: LAPAROSCOPIC LYSIS OF ADHESIONS LAPAROSCOPIC SIGMOID COLECTOMY COLOSTOMY Creation  Patient location during evaluation: PACU Anesthesia Type: General Level of consciousness: awake and alert Pain management: pain level controlled Vital Signs Assessment: post-procedure vital signs reviewed and stable Respiratory status: spontaneous breathing, nonlabored ventilation, respiratory function stable and patient connected to nasal cannula oxygen Cardiovascular status: blood pressure returned to baseline and stable Postop Assessment: no apparent nausea or vomiting Anesthetic complications: no     Last Vitals:  Vitals:   01/13/18 1748 01/13/18 1813  BP: 129/67 124/77  Pulse: 80 80  Resp: 14   Temp:  36.7 C  SpO2: 92% 94%    Last Pain:  Vitals:   01/13/18 1813  TempSrc: Oral  PainSc:                  Martha Clan

## 2018-01-13 NOTE — Progress Notes (Signed)
[place] at [place] NAME: Misty Green    MR#:  [id number]  DATE OF BIRTH:  05-14-1970  SUBJECTIVE:  CHIEF COMPLAINT:   Chief Complaint  Patient presents with  . Abdominal Pain   Has left lower quadrant pain , manageable with current pain medications Scheduled for surgery today for recurrent diverticulitis.  Husband, son and other significant family members at bedside. REVIEW OF SYSTEMS:   ROS CONSTITUTIONAL: No fever, fatigue or weakness.  EYES: No blurred or double vision.  EARS, NOSE, AND THROAT: No tinnitus or ear pain.  RESPIRATORY: No cough, shortness of breath, wheezing or hemoptysis.  CARDIOVASCULAR: No chest pain, orthopnea, edema.  GASTROINTESTINAL: Nausea, left lower quadrant abdominal pain, GENITOURINARY: No dysuria, hematuria.  ENDOCRINE: No polyuria, nocturia,  HEMATOLOGY: No anemia, easy bruising or bleeding SKIN: No rash or lesion. MUSCULOSKELETAL: No joint pain or arthritis.   NEUROLOGIC: No tingling, numbness, weakness.  PSYCHIATRY: No anxiety or depression.   DRUG ALLERGIES:   Allergies  Allergen Reactions  . Other Nausea And Vomiting    Anti-depressent that starts with t    VITALS:  Blood pressure (!) 143/85, pulse 63, temperature (!) 96.8 F (36 C), temperature source Tympanic, resp. rate 17, height 5\' 3"  (1.6 m), weight 94.2 kg, SpO2 96 %.  PHYSICAL EXAMINATION:  GENERAL:  47 y.o.-year-old patient lying in the bed with no acute distress.  EYES: Pupils equal, round, reactive to light and accommodation. No scleral icterus. Extraocular muscles intact.  HEENT: Head atraumatic, normocephalic. Oropharynx and nasopharynx clear.  NECK:  Supple, no jugular venous distention. No thyroid enlargement, no tenderness.  LUNGS: Had expiratory wheeze in all lung's yesterday but mostly clear today. CARDIOVASCULAR: S1, S2 normal. No murmurs, rubs, or gallops.  ABDOMEN: Soft, lower abdominal tenderness is  present no rebound tenderness, nondistended. Bowel sounds present.  EXTREMITIES: No pedal edema, cyanosis, or clubbing.  NEUROLOGIC: Awake alert and oriented x3 sensation intact. Gait not checked.  PSYCHIATRIC: The patient is alert and oriented x 3.  SKIN: No obvious rash, lesion, or ulcer.    LABORATORY PANEL:   CBC Recent Labs  Lab 01/12/18 0539  WBC 8.5  HGB 12.4  HCT 41.9  PLT 269   ------------------------------------------------------------------------------------------------------------------  Chemistries  Recent Labs  Lab 01/12/18 0539  NA 143  K 4.4  CL 94*  CO2 41*  GLUCOSE 96  BUN 11  CREATININE 0.48  CALCIUM 9.1  MG 2.8*  AST 39  ALT 52*  ALKPHOS 40  BILITOT 0.4   ------------------------------------------------------------------------------------------------------------------  Cardiac Enzymes No results for input(s): TROPONINI in the last 168 hours. ------------------------------------------------------------------------------------------------------------------  RADIOLOGY:  No results found.  EKG:   Orders placed or performed during the hospital encounter of 03/21/16  . EKG 12-Lead  . EKG 12-Lead  . EKG 12-Lead  . EKG 12-Lead  . EKG 12-Lead    ASSESSMENT AND PLAN:   #1. recurrent acute sigmoid colon diverticulitis, clinically did not improve with antibiotic therapy, discussed with Dr. Perrin Maltese, patient scheduled for  colectomy today  n.p.o. after midnight  continued IV antibiotics Zosyn for a total of 8 days which is discontinued today and continue. pain meds as needed Morphine 2 mg IV as needed for severe pain, dose decreased from 4 mg because of her lethargy, concern about respiratory depression  Seen by Dr. [name], scheduled for surgery colectomy on Friday, plans to start bowel prep tomorrow  .  #2. COPD exacerbation: Clinically improving, taper IV to p.o. steroids,  bronchodilators.  History of chronic leg edema,  Lasix   .  History  of neuropathy: Continue Neurontin.    Depression, chronic pain issues: Denies any suicidal ideation.  Continue current management of pain with IV morphine and oxycodone as needed basis  All the records are reviewed and case discussed with Care Management/Social Workerr. Management plans discussed with the patient, she is in agreement   CODE STATUS: Full code  TOTAL TIME TAKING CARE OF THIS PATIENT: 33 minutes.   POSSIBLE D/C IN 1-2DAYS, DEPENDING ON CLINICAL CONDITION.  Other than 50% time spent in counseling, coordination of care Nicholes Mango M.D on 01/13/2018 at 2:30 PM  Between 7am to 6pm - Pager - [phone]  After 6pm go to [email or web address] - password [email or web address] [facility]  Office  [phone]  CC: Primary care physician; Sinda Du, MD   Note: This dictation was prepared with Dragon dictation along with smaller phrase technology. Any transcriptional errors that result from this process are unintentional.

## 2018-01-13 NOTE — Transfer of Care (Signed)
Immediate Anesthesia Transfer of Care Note  Patient: Misty Green  Procedure(s) Performed: LAPAROSCOPIC LYSIS OF ADHESIONS LAPAROSCOPIC SIGMOID COLECTOMY COLOSTOMY Creation  Patient Location: PACU  Anesthesia Type:General  Level of Consciousness: drowsy  Airway & Oxygen Therapy: Patient Spontanous Breathing and Patient connected to face mask oxygen  Post-op Assessment: Report given to RN and Post -op Vital signs reviewed and stable  Post vital signs: Reviewed and stable  Last Vitals:  Vitals Value Taken Time  BP 148/79 01/13/2018  3:55 PM  Temp    Pulse 91 01/13/2018  3:59 PM  Resp 22 01/13/2018  3:59 PM  SpO2 99 % 01/13/2018  3:59 PM  Vitals shown include unvalidated device data.  Last Pain:  Vitals:   01/13/18 0946  TempSrc: Tympanic  PainSc: 8       Patients Stated Pain Goal: 0 (73/66/81 5947)  Complications: No apparent anesthesia complications

## 2018-01-13 NOTE — Consult Note (Signed)
Pharmacy Antibiotic Note  Misty Green is a 47 y.o. female admitted on 01/06/2018 with an intra-abdominal infection.  Pharmacy has been consulted for Zosyn dosing. She has had multiple rounds of antibiotics for diverticulitis, most recently oral Cipro and Flagyl which she just completed.  Plan: Continue  Zosyn 3.375g IV q8h (4 hour infusion).   Patient is having colectomy today and plan for continued abx therapy will be re-evaluated.  Height: 5\' 3"  (160 cm) Weight: 207 lb 11.2 oz (94.2 kg) IBW/kg (Calculated) : 52.4  Temp (24hrs), Avg:97.8 F (36.6 C), Min:96.8 F (36 C), Max:98.7 F (37.1 C)  Recent Labs  Lab 01/06/18 1422 01/07/18 0555 01/12/18 0539  WBC 7.2 4.7 8.5  CREATININE 0.38* 0.49 0.48    Estimated Creatinine Clearance: 93.8 mL/min (by C-G formula based on SCr of 0.48 mg/dL).    Antimicrobials this admission: Zosyn 12/20 >>  Ertapenem 12/27 >> Fluconazole 12/26 >>  Microbiology results: N/A  Thank you for allowing pharmacy to be a part of this patient's care. Lu Duffel, PharmD, BCPS Clinical Pharmacist 01/13/2018 1:15 PM

## 2018-01-14 ENCOUNTER — Encounter: Payer: Self-pay | Admitting: Surgery

## 2018-01-14 LAB — CBC
HCT: 35.4 % — ABNORMAL LOW (ref 36.0–46.0)
HEMOGLOBIN: 10.8 g/dL — AB (ref 12.0–15.0)
MCH: 30.4 pg (ref 26.0–34.0)
MCHC: 30.5 g/dL (ref 30.0–36.0)
MCV: 99.7 fL (ref 80.0–100.0)
Platelets: 238 10*3/uL (ref 150–400)
RBC: 3.55 MIL/uL — ABNORMAL LOW (ref 3.87–5.11)
RDW: 13.4 % (ref 11.5–15.5)
WBC: 10.3 10*3/uL (ref 4.0–10.5)
nRBC: 0.6 % — ABNORMAL HIGH (ref 0.0–0.2)

## 2018-01-14 LAB — BASIC METABOLIC PANEL
Anion gap: 4 — ABNORMAL LOW (ref 5–15)
BUN: 10 mg/dL (ref 6–20)
CO2: 37 mmol/L — ABNORMAL HIGH (ref 22–32)
Calcium: 8 mg/dL — ABNORMAL LOW (ref 8.9–10.3)
Chloride: 97 mmol/L — ABNORMAL LOW (ref 98–111)
Creatinine, Ser: 0.39 mg/dL — ABNORMAL LOW (ref 0.44–1.00)
GFR calc Af Amer: >60 mL/min (ref >60)
GFR calc non Af Amer: >60 mL/min (ref >60)
Glucose, Bld: 129 mg/dL — ABNORMAL HIGH (ref 70–99)
Potassium: 4.4 mmol/L (ref 3.5–5.1)
Sodium: 138 mmol/L (ref 135–145)

## 2018-01-14 MED ORDER — ENOXAPARIN SODIUM 40 MG/0.4ML ~~LOC~~ SOLN
40.0000 mg | SUBCUTANEOUS | Status: DC
  Administered 2018-01-14 – 2018-01-18 (×5): 40 mg via SUBCUTANEOUS
  Filled 2018-01-14 (×5): qty 0.4

## 2018-01-14 MED ORDER — KETOROLAC TROMETHAMINE 30 MG/ML IJ SOLN
30.0000 mg | Freq: Four times a day (QID) | INTRAMUSCULAR | Status: DC
  Administered 2018-01-14 – 2018-01-18 (×16): 30 mg via INTRAVENOUS
  Filled 2018-01-14 (×16): qty 1

## 2018-01-14 MED ORDER — PIPERACILLIN-TAZOBACTAM 3.375 G IVPB
3.3750 g | Freq: Three times a day (TID) | INTRAVENOUS | Status: DC
  Administered 2018-01-14 – 2018-01-17 (×10): 3.375 g via INTRAVENOUS
  Filled 2018-01-14 (×10): qty 50

## 2018-01-14 NOTE — Progress Notes (Signed)
Patient stated she would self place home CPAP at bedtime. RT evaluated home unit and found it to be in good working condition. No lose or frayed wires. Plugged into red outlet. 4L O2 bleed-in added to home circuit. Patient resting comfortably in bed.

## 2018-01-14 NOTE — Progress Notes (Signed)
Patient states she will self place home CPAP on herself when ready for bed. Patient advised to called Respiratory for assistance when ready.

## 2018-01-14 NOTE — Progress Notes (Signed)
01/14/2018  Subjective: Patient is 1 Day Post-Op s/p laparoscopic sigmoidectomy and end colostomy.  No acute events.  Patient reports abdominal pain.  Denies nausea.  Vital signs: Temp:  [97.6 F (36.4 C)-99.7 F (37.6 C)] 98.8 F (37.1 C) (12/28 0444) Pulse Rate:  [80-96] 89 (12/28 0509) Resp:  [10-24] 18 (12/28 0444) BP: (116-148)/(66-81) 116/66 (12/28 0444) SpO2:  [88 %-99 %] 91 % (12/28 0738) Weight:  [89.2 kg] 89.2 kg (12/28 0444)   Intake/Output: 12/27 0701 - 12/28 0700 In: 3219 [I.V.:3169; IV Piggyback:50] Out: 2975 [VHQIO:9629; Drains:300; Blood:250] Last BM Date: 01/11/18  Physical Exam: Constitutional: No acute distress Abdomen:  Soft, nondistended, appropriately tender to palpation.  Incisions clean, dry, intact with dressings in place.  End colostomy with edematous mucosa but viable and pink.  No gas or stool in bag yet.  JP drain with serosanguinous fluid.  Labs:  Recent Labs    01/12/18 0539 01/14/18 0522  WBC 8.5 10.3  HGB 12.4 10.8*  HCT 41.9 35.4*  PLT 269 238   Recent Labs    01/12/18 0539 01/14/18 0522  NA 143 138  K 4.4 4.4  CL 94* 97*  CO2 41* 37*  GLUCOSE 96 129*  BUN 11 10  CREATININE 0.48 0.39*  CALCIUM 9.1 8.0*   No results for input(s): LABPROT, INR in the last 72 hours.  Imaging: No results found.  Assessment/Plan: This is a 47 y.o. female s/p laparoscopic sigmoidectomy and end colostomy  --continue clear liquid diet and IV fluid hydration.  Awaiting return of bowel function prior to advancing diet. --Will resume Zosyn (had expired overnight) for a few days post-op given significant infection and inflammation in left lower quadrant. --OOB, ambulate as tolerated.   --DVT prophylaxis resume today with lovenox. --Pain control.  Start toradol today.   Melvyn Neth, [facility]

## 2018-01-14 NOTE — Progress Notes (Signed)
[place] at [place] NAME: Misty Green    MR#:  [id number]  DATE OF BIRTH:  1970/12/26  SUBJECTIVE:  CHIEF COMPLAINT:   Chief Complaint  Patient presents with  . Abdominal Pain   Status post colectomy and end colostomy on 01/13/2018. Continues to have significant pain.  REVIEW OF SYSTEMS:   ROS CONSTITUTIONAL: No fever, fatigue or weakness.  EYES: No blurred or double vision.  EARS, NOSE, AND THROAT: No tinnitus or ear pain.  RESPIRATORY: No cough, shortness of breath, wheezing or hemoptysis.  CARDIOVASCULAR: No chest pain, orthopnea, edema.  GASTROINTESTINAL: Nausea, abdominal pain GENITOURINARY: No dysuria, hematuria.  ENDOCRINE: No polyuria, nocturia,  HEMATOLOGY: No anemia, easy bruising or bleeding SKIN: No rash or lesion. MUSCULOSKELETAL: No joint pain or arthritis.   NEUROLOGIC: No tingling, numbness, weakness.  PSYCHIATRY: No anxiety or depression.   DRUG ALLERGIES:   Allergies  Allergen Reactions  . Other Nausea And Vomiting    Anti-depressent that starts with t    VITALS:  Blood pressure 116/66, pulse 89, temperature 98.8 F (37.1 C), temperature source Oral, resp. rate 18, height 5\' 3"  (1.6 m), weight 89.2 kg, SpO2 91 %.  PHYSICAL EXAMINATION:  GENERAL:  47 y.o.-year-old patient lying in the bed with no acute distress.  EYES: Pupils equal, round, reactive to light and accommodation. No scleral icterus. Extraocular muscles intact.  HEENT: Head atraumatic, normocephalic. Oropharynx and nasopharynx clear.  NECK:  Supple, no jugular venous distention. No thyroid enlargement, no tenderness.  LUNGS: Had expiratory wheeze in all lung's yesterday but mostly clear today. CARDIOVASCULAR: S1, S2 normal. No murmurs, rubs, or gallops.  ABDOMEN: Colostomy site looks clean.  Diffusely tender but soft EXTREMITIES: No pedal edema, cyanosis, or clubbing.  NEUROLOGIC: Awake alert and oriented x3 sensation intact. Gait  not checked.  PSYCHIATRIC: The patient is alert and oriented x 3.  SKIN: No obvious rash, lesion, or ulcer.    LABORATORY PANEL:   CBC Recent Labs  Lab 01/14/18 0522  WBC 10.3  HGB 10.8*  HCT 35.4*  PLT 238   ------------------------------------------------------------------------------------------------------------------  Chemistries  Recent Labs  Lab 01/12/18 0539 01/14/18 0522  NA 143 138  K 4.4 4.4  CL 94* 97*  CO2 41* 37*  GLUCOSE 96 129*  BUN 11 10  CREATININE 0.48 0.39*  CALCIUM 9.1 8.0*  MG 2.8*  --   AST 39  --   ALT 52*  --   ALKPHOS 40  --   BILITOT 0.4  --    ------------------------------------------------------------------------------------------------------------------  Cardiac Enzymes No results for input(s): TROPONINI in the last 168 hours. ------------------------------------------------------------------------------------------------------------------  RADIOLOGY:  No results found.  EKG:   Orders placed or performed during the hospital encounter of 03/21/16  . EKG 12-Lead  . EKG 12-Lead  . EKG 12-Lead  . EKG 12-Lead  . EKG 12-Lead    ASSESSMENT AND PLAN:   #1. Recurrent sigmoid colon diverticulitis Did not improve with IV antibiotics. Status post colectomy and end colostomy on 01/13/2018 Postop day 1 Restarted IV Zosyn due to significant infection found during surgery Pain medications as needed  *Acute blood loss anemia status post abdominal surgery.  Monitor.  No need for transfusion today.  * COPD exacerbation Much improved.  On prednisone  * History of chronic leg edema,  Lasix   * Neuropathy: Continue Neurontin.   * Depression, chronic pain issues: Denies any suicidal ideation.  Continue current management of pain with IV morphine and oxycodone  as needed basis  All the records are reviewed and case discussed with Care Management/Social Workerr. Management plans discussed with the patient, she is in agreement   CODE  STATUS: Full code  TOTAL TIME TAKING CARE OF THIS PATIENT: 33 minutes.   POSSIBLE D/C IN 1-2DAYS, DEPENDING ON CLINICAL CONDITION.  Misty Green M.D on 01/14/2018 at 12:57 PM  Between 7am to 6pm - Pager - [phone]  After 6pm go to [email or web address] - password [email or web address] [facility]  Office  [phone]  CC: Primary care physician; Sinda Du, MD   Note: This dictation was prepared with Dragon dictation along with smaller phrase technology. Any transcriptional errors that result from this process are unintentional.

## 2018-01-15 LAB — CBC WITH DIFFERENTIAL/PLATELET
Abs Immature Granulocytes: 0.06 10*3/uL (ref 0.00–0.07)
BASOS ABS: 0 10*3/uL (ref 0.0–0.1)
Basophils Relative: 0 %
Eosinophils Absolute: 0.1 10*3/uL (ref 0.0–0.5)
Eosinophils Relative: 1 %
HCT: 33.8 % — ABNORMAL LOW (ref 36.0–46.0)
Hemoglobin: 10.1 g/dL — ABNORMAL LOW (ref 12.0–15.0)
Immature Granulocytes: 1 %
Lymphocytes Relative: 26 %
Lymphs Abs: 2.3 10*3/uL (ref 0.7–4.0)
MCH: 30.7 pg (ref 26.0–34.0)
MCHC: 29.9 g/dL — ABNORMAL LOW (ref 30.0–36.0)
MCV: 102.7 fL — ABNORMAL HIGH (ref 80.0–100.0)
Monocytes Absolute: 0.8 10*3/uL (ref 0.1–1.0)
Monocytes Relative: 9 %
Neutro Abs: 5.6 10*3/uL (ref 1.7–7.7)
Neutrophils Relative %: 63 %
Platelets: 209 10*3/uL (ref 150–400)
RBC: 3.29 MIL/uL — ABNORMAL LOW (ref 3.87–5.11)
RDW: 14 % (ref 11.5–15.5)
WBC: 8.9 10*3/uL (ref 4.0–10.5)
nRBC: 0.3 % — ABNORMAL HIGH (ref 0.0–0.2)

## 2018-01-15 LAB — BASIC METABOLIC PANEL
Anion gap: 7 (ref 5–15)
BUN: 9 mg/dL (ref 6–20)
CO2: 38 mmol/L — ABNORMAL HIGH (ref 22–32)
Calcium: 8.3 mg/dL — ABNORMAL LOW (ref 8.9–10.3)
Chloride: 95 mmol/L — ABNORMAL LOW (ref 98–111)
Creatinine, Ser: 0.5 mg/dL (ref 0.44–1.00)
GFR calc non Af Amer: >60 mL/min (ref >60)
Glucose, Bld: 102 mg/dL — ABNORMAL HIGH (ref 70–99)
POTASSIUM: 3.4 mmol/L — AB (ref 3.5–5.1)
Sodium: 140 mmol/L (ref 135–145)

## 2018-01-15 LAB — MAGNESIUM: Magnesium: 2 mg/dL (ref 1.7–2.4)

## 2018-01-15 MED ORDER — POTASSIUM CHLORIDE CRYS ER 20 MEQ PO TBCR
40.0000 meq | EXTENDED_RELEASE_TABLET | Freq: Once | ORAL | Status: AC
  Administered 2018-01-15: 40 meq via ORAL
  Filled 2018-01-15: qty 2

## 2018-01-15 MED ORDER — MORPHINE SULFATE (PF) 4 MG/ML IV SOLN
4.0000 mg | INTRAVENOUS | Status: DC | PRN
  Administered 2018-01-15 – 2018-01-16 (×5): 4 mg via INTRAVENOUS
  Filled 2018-01-15 (×6): qty 1

## 2018-01-15 NOTE — Progress Notes (Signed)
[facility] [place] NAME: Misty Green    MR#:  [id number]  DATE OF BIRTH:  1970/05/27  SUBJECTIVE:  CHIEF COMPLAINT:   Chief Complaint  Patient presents with  . Abdominal Pain   Status post colectomy and end colostomy on 01/13/2018. Continues to have significant pain. Had abused narcotics in the past.  Tells me she has not been on narcotics for 19 months prior to this admission. Has been requesting increased dose of Lasix. Good urine output.  REVIEW OF SYSTEMS:   ROS CONSTITUTIONAL: No fever, fatigue or weakness.  EYES: No blurred or double vision.  EARS, NOSE, AND THROAT: No tinnitus or ear pain.  RESPIRATORY: No cough, shortness of breath, wheezing or hemoptysis.  CARDIOVASCULAR: No chest pain, orthopnea, edema.  GASTROINTESTINAL: Nausea, abdominal pain GENITOURINARY: No dysuria, hematuria.  ENDOCRINE: No polyuria, nocturia,  HEMATOLOGY: No anemia, easy bruising or bleeding SKIN: No rash or lesion. MUSCULOSKELETAL: No joint pain or arthritis.   NEUROLOGIC: No tingling, numbness, weakness.  PSYCHIATRY: No anxiety or depression.   DRUG ALLERGIES:   Allergies  Allergen Reactions  . Other Nausea And Vomiting    Anti-depressent that starts with t    VITALS:  Blood pressure 105/61, pulse 66, temperature 97.8 F (36.6 C), temperature source Oral, resp. rate 15, height 5\' 3"  (1.6 m), weight 97.5 kg, SpO2 95 %.  PHYSICAL EXAMINATION:  GENERAL:  47 y.o.-year-old patient lying in the bed with no acute distress.  EYES: Pupils equal, round, reactive to light and accommodation. No scleral icterus. Extraocular muscles intact.  HEENT: Head atraumatic, normocephalic. Oropharynx and nasopharynx clear.  NECK:  Supple, no jugular venous distention. No thyroid enlargement, no tenderness.  LUNGS: Had expiratory wheeze in all lung's yesterday but mostly clear today. CARDIOVASCULAR: S1, S2 normal. No murmurs, rubs, or gallops.   ABDOMEN: Colostomy site looks clean.  Diffusely tender but soft Foley catheter in place EXTREMITIES: No pedal edema, cyanosis, or clubbing.  NEUROLOGIC: Awake alert and oriented x3 sensation intact. Gait not checked.  PSYCHIATRIC: The patient is alert and oriented x 3.  SKIN: No obvious rash, lesion, or ulcer.   LABORATORY PANEL:   CBC Recent Labs  Lab 01/15/18 0401  WBC 8.9  HGB 10.1*  HCT 33.8*  PLT 209   ------------------------------------------------------------------------------------------------------------------  Chemistries  Recent Labs  Lab 01/12/18 0539  01/15/18 0401  NA 143   < > 140  K 4.4   < > 3.4*  CL 94*   < > 95*  CO2 41*   < > 38*  GLUCOSE 96   < > 102*  BUN 11   < > 9  CREATININE 0.48   < > 0.50  CALCIUM 9.1   < > 8.3*  MG 2.8*  --  2.0  AST 39  --   --   ALT 52*  --   --   ALKPHOS 40  --   --   BILITOT 0.4  --   --    < > = values in this interval not displayed.   ------------------------------------------------------------------------------------------------------------------  Cardiac Enzymes No results for input(s): TROPONINI in the last 168 hours. ------------------------------------------------------------------------------------------------------------------  RADIOLOGY:  No results found.  EKG:   Orders placed or performed during the hospital encounter of 03/21/16  . EKG 12-Lead  . EKG 12-Lead  . EKG 12-Lead  . EKG 12-Lead  . EKG 12-Lead    ASSESSMENT AND PLAN:   * Recurrent sigmoid colon diverticulitis Did not  improve with IV antibiotics. Status post colectomy and end colostomy on 01/13/2018 Postop day 2 Restarted IV Zosyn due to significant infection found during surgery Pain medications as needed  *Acute blood loss anemia status post abdominal surgery.  Monitor.  No need for transfusion today.  * COPD exacerbation Much improved.  On prednisone  * History of chronic leg edema,  Lasix . Will stop IV fluids today  and monitor urine output  * Neuropathy: Continue Neurontin.   * Depression, chronic pain issues .  Continue current management of pain with IV morphine and oxycodone as needed basis  * suspected entero vesical fistula during surgery Continue Foley catheter  All the records are reviewed and case discussed with Care Management/Social Workerr. Management plans discussed with the patient, she is in agreement   CODE STATUS: Full code  TOTAL TIME TAKING CARE OF THIS PATIENT: 30 minutes.   POSSIBLE D/C IN 1-2DAYS, DEPENDING ON CLINICAL CONDITION.  Neita Carp M.D on 01/15/2018 at 9:58 AM  Between 7am to 6pm - Pager - [phone]  After 6pm go to [email or web address] - password [email or web address] [facility]  Office  [phone]  CC: Primary care physician; Sinda Du, MD   Note: This dictation was prepared with Dragon dictation along with smaller phrase technology. Any transcriptional errors that result from this process are unintentional.

## 2018-01-15 NOTE — Progress Notes (Signed)
Could not wean off oxygen. Oxygen at room air is 89%, oxygen on 2L is 92-93%. Oxygen has been decreased from 4L to 2L.

## 2018-01-15 NOTE — Plan of Care (Signed)
The patient has continued to complain about pain. PRN pain med provided. Tolerating diet change from clears to full liquid diet. IV antibiotics provided. Ambulated in the hallway.  No falls.  Problem: Education: Goal: Knowledge of General Education information will improve Description Including pain rating scale, medication(s)/side effects and non-pharmacologic comfort measures Outcome: Progressing   Problem: Health Behavior/Discharge Planning: Goal: Ability to manage health-related needs will improve Outcome: Progressing   Problem: Clinical Measurements: Goal: Ability to maintain clinical measurements within normal limits will improve Outcome: Progressing Goal: Will remain free from infection Outcome: Progressing Goal: Diagnostic test results will improve Outcome: Progressing Goal: Respiratory complications will improve Outcome: Progressing Goal: Cardiovascular complication will be avoided Outcome: Progressing   Problem: Activity: Goal: Risk for activity intolerance will decrease Outcome: Progressing   Problem: Nutrition: Goal: Adequate nutrition will be maintained Outcome: Progressing   Problem: Coping: Goal: Level of anxiety will decrease Outcome: Progressing   Problem: Elimination: Goal: Will not experience complications related to bowel motility Outcome: Progressing Goal: Will not experience complications related to urinary retention Outcome: Progressing   Problem: Pain Managment: Goal: General experience of comfort will improve Outcome: Progressing   Problem: Safety: Goal: Ability to remain free from injury will improve Outcome: Progressing   Problem: Skin Integrity: Goal: Risk for impaired skin integrity will decrease Outcome: Progressing

## 2018-01-15 NOTE — Progress Notes (Signed)
[facility] Hospital Day(s): 9.   Post op day(s): 2 Days Post-Op.   Interval History: Patient seen and examined, no acute events or new complaints overnight. Patient reports significant place on left lower quadrant, denies nausea or vomiting. Also complaining of leg swelling.  Vital signs in last 24 hours: [min-max] current  Temp:  [97.8 F (36.6 C)-98.8 F (37.1 C)] 97.8 F (36.6 C) (12/29 0404) Pulse Rate:  [66-78] 66 (12/29 0404) Resp:  [15-18] 15 (12/29 0404) BP: (105-134)/(60-78) 105/61 (12/29 0404) SpO2:  [91 %-99 %] 91 % (12/29 0404) Weight:  [97.5 kg] 97.5 kg (12/29 0500)     Height: 5\' 3"  (160 cm) Weight: 97.5 kg BMI (Calculated): 38.09   Physical Exam:  Constitutional: alert, cooperative and no distress  Respiratory: breathing non-labored at rest  Cardiovascular: regular rate and sinus rhythm  Gastrointestinal: soft. Ostomy pink but edematous with stool in bag. Wounds are dry and clean covered with dressings.   Labs:  CBC Latest Ref Rng & Units 01/15/2018 01/14/2018 01/12/2018  WBC 4.0 - 10.5 K/uL 8.9 10.3 8.5  Hemoglobin 12.0 - 15.0 g/dL 10.1(L) 10.8(L) 12.4  Hematocrit 36.0 - 46.0 % 33.8(L) 35.4(L) 41.9  Platelets 150 - 400 K/uL 209 238 269   CMP Latest Ref Rng & Units 01/15/2018 01/14/2018 01/12/2018  Glucose 70 - 99 mg/dL 102(H) 129(H) 96  BUN 6 - 20 mg/dL 9 10 11   Creatinine 0.44 - 1.00 mg/dL 0.50 0.39(L) 0.48  Sodium 135 - 145 mmol/L 140 138 143  Potassium 3.5 - 5.1 mmol/L 3.4(L) 4.4 4.4  Chloride 98 - 111 mmol/L 95(L) 97(L) 94(L)  CO2 22 - 32 mmol/L 38(H) 37(H) 41(H)  Calcium 8.9 - 10.3 mg/dL 8.3(L) 8.0(L) 9.1  Total Protein 6.5 - 8.1 g/dL - - 7.4  Total Bilirubin 0.3 - 1.2 mg/dL - - 0.4  Alkaline Phos 38 - 126 U/L - - 40  AST 15 - 41 U/L - - 39  ALT 0 - 44 U/L - - 52(H)    Imaging studies: No new pertinent imaging studies   Assessment/Plan:  47 y.o. female with persistent diverticulitis 2 Days Post-Op s/p Hartman's  procedure. Today continue with same complain of pain. Very difficult to control pain. I think that her pain medication regimen is adequate. Patient started to pass stool per ostomy. Will advance diet to full liquid.  She wants lasix, but she already has it. I don't think that she needs a higher dose. Will appreciate Hospitalist recommendations. Foley needs to stay due to inflammation around the bladder during surgery with suspected entero vesical fistula. Adequate urine output and clear. Encourage to ambulate.   Arnold Long, MD

## 2018-01-16 DIAGNOSIS — K5732 Diverticulitis of large intestine without perforation or abscess without bleeding: Secondary | ICD-10-CM

## 2018-01-16 MED ORDER — ENSURE ENLIVE PO LIQD
237.0000 mL | Freq: Two times a day (BID) | ORAL | Status: DC
  Administered 2018-01-16 – 2018-01-17 (×4): 237 mL via ORAL

## 2018-01-16 MED ORDER — ALUM & MAG HYDROXIDE-SIMETH 200-200-20 MG/5ML PO SUSP
30.0000 mL | Freq: Four times a day (QID) | ORAL | Status: DC | PRN
  Administered 2018-01-16: 30 mL via ORAL
  Filled 2018-01-16: qty 30

## 2018-01-16 MED ORDER — PANTOPRAZOLE SODIUM 40 MG PO TBEC
40.0000 mg | DELAYED_RELEASE_TABLET | Freq: Two times a day (BID) | ORAL | Status: DC
  Administered 2018-01-16 – 2018-01-18 (×5): 40 mg via ORAL
  Filled 2018-01-16 (×5): qty 1

## 2018-01-16 MED ORDER — POTASSIUM CHLORIDE CRYS ER 20 MEQ PO TBCR
40.0000 meq | EXTENDED_RELEASE_TABLET | Freq: Once | ORAL | Status: AC
  Administered 2018-01-16: 40 meq via ORAL
  Filled 2018-01-16: qty 2

## 2018-01-16 MED ORDER — MORPHINE SULFATE (PF) 2 MG/ML IV SOLN
2.0000 mg | INTRAVENOUS | Status: DC | PRN
  Administered 2018-01-16 – 2018-01-17 (×6): 2 mg via INTRAVENOUS
  Filled 2018-01-16 (×6): qty 1

## 2018-01-16 NOTE — Progress Notes (Addendum)
SURGICAL PROGRESS NOTE  Patient seen and examined as described below with surgical PA-C, Ardell Isaacs.  Assessment/Plan: (ICD-10's: K8.20) 47 y.o. female with resolving post-surgical ileus 3 Days Post-Op s/p sigmoid colectomy with creation of end-colostomy and takedown of splenic flexure for persistent sigmoid diverticulitis despite antibiotic management, complicated by pertinent comorbidities including history of until recently chronic opiates abuse/dependence, obesity (BMI 34), COPD, chronic constipation, and chronic ongoing tobacco abuse (smoking).   - advance to soft diet, decrease/stop IV fluids  - pain control prn (minimize narcotics and wean IV narcotics as able)  - remove Foley catheter (kept x3 days due to inflammation between bladder and resected colon)  - DVT prophylaxis, ambulation strongly encouraged, smoking cessation likewise encouraged  - medical management of comorbidities as per primary medical team  - monitor bowel function and abdominal exam  - discharge planning  All of the above findings and recommendations were discussed with the patient and patient's RN, and all of patient's questions were answered to her rather enthusiastically expressed satisfaction.  -- Marilynne Drivers Rosana Hoes, MD, Daniel: [facility] - Partnering for exceptional care. Office: [place] Hospital Day(s): 10.   Post op day(s): 3 Days Post-Op.   Interval History: Patient seen and examined, no acute events or new complaints overnight. Patient reports that her abdominal pain has improved. She denied any complaints of fevers, chills, nausea, or emesis. She has been tolerating a full liquid diet. Mobilizing well. She has started to have stool output from her ostomy.   Review of Systems:  Constitutional: denies fever, chills  Respiratory: denies any shortness of breath  Cardiovascular: denies chest pain  or palpitations  Gastrointestinal: denies abdominal pain, N/V, or diarrhea/and bowel function as per interval history Musculoskeletal: denies pain, decreased motor or sensation Integumentary: denies any other rashes or skin discolorations  Vital signs in last 24 hours: [min-max] current  Temp:  [97.6 F (36.4 C)-98.5 F (36.9 C)] 98.3 F (36.8 C) (12/30 1133) Pulse Rate:  [61-75] 67 (12/30 1133) Resp:  [12-18] 16 (12/30 0806) BP: (107-119)/(51-69) 119/69 (12/30 1133) SpO2:  [89 %-97 %] 95 % (12/30 1133)     Height: 5\' 3"  (160 cm) Weight: 97.5 kg BMI (Calculated): 38.09   Intake/Output this shift:  Total I/O In: 480 [P.O.:480] Out: 1070 [Urine:1000; Drains:20; Stool:50]   Intake/Output last 2 shifts:  @IOLAST2SHIFTS @   Physical Exam:  Constitutional: alert, cooperative and no distress  Respiratory: breathing non-labored at rest  Cardiovascular: regular rate and sinus rhythm  Gastrointestinal: soft, mild incisional tenderness, and non-distended. Colostomy in LLQ, patent, brown liquid stool ion bag Integumentary: Midline laparotomy incision is CDI, no erythema, honeycomb in place   Labs:  CBC Latest Ref Rng & Units 01/15/2018 01/14/2018 01/12/2018  WBC 4.0 - 10.5 K/uL 8.9 10.3 8.5  Hemoglobin 12.0 - 15.0 g/dL 10.1(L) 10.8(L) 12.4  Hematocrit 36.0 - 46.0 % 33.8(L) 35.4(L) 41.9  Platelets 150 - 400 K/uL 209 238 269   CMP Latest Ref Rng & Units 01/15/2018 01/14/2018 01/12/2018  Glucose 70 - 99 mg/dL 102(H) 129(H) 96  BUN 6 - 20 mg/dL 9 10 11   Creatinine 0.44 - 1.00 mg/dL 0.50 0.39(L) 0.48  Sodium 135 - 145 mmol/L 140 138 143  Potassium 3.5 - 5.1 mmol/L 3.4(L) 4.4 4.4  Chloride 98 - 111 mmol/L 95(L) 97(L) 94(L)  CO2 22 - 32 mmol/L 38(H) 37(H) 41(H)  Calcium 8.9 - 10.3 mg/dL 8.3(L) 8.0(L) 9.1  Total Protein  6.5 - 8.1 g/dL - - 7.4  Total Bilirubin 0.3 - 1.2 mg/dL - - 0.4  Alkaline Phos 38 - 126 U/L - - 40  AST 15 - 41 U/L - - 39  ALT 0 - 44 U/L - - 52(H)     Assessment/Plan: (ICD-10's: K68.32) 47 y.o. female with improving pain 3 Days Post-Op s/p Hartman's Procedure for chronic diverticulitis, complicated by pertinent comorbidities including history of until recently chronic opiates abuse/dependence, obesity (BMI 34), COPD, and chronic ongoing tobacco abuse (smoking).   - Advance to soft diet this afternoon, wean IVF   - Will discontinue foley as it is POD3   - Pain control as needed (wean IV medications, limit narcotics as possible)  - Continue to monitor abdominal examination and on-going ostomy function  - Mobilize  - Medical management per primary team.    All of the above findings and recommendations were discussed with the patient, and the medical team, and all of patient's questions were answered to her expressed satisfaction.  -- Edison Simon, PA-C [facility] 01/16/2018, 12:07 PM [phone] M-F: 7am - 4pm

## 2018-01-16 NOTE — Consult Note (Signed)
[place] Nurse ostomy consult note Stoma type/location: LLQ end colostomy Stomal assessment/size: edematous, 2" rubrous Peristomal assessment: used barrier ring, os points down to 6 o'clock and some leakage had occurred Treatment options for stomal/peristomal skin:barrier ring  Output soft brown stool Ostomy pouching: 2pc. 2 3/4" pouch with barrier ring  Education provided: pouch change performed.  Spouse cut barrier to fit and rolled pouch closed. Discussed twice weekly pouch changes and emptying when 1/3 full.  Enrolled patient in [place] program: Yes [place] team will follow.  Domenic Moras MSN, RN, FNP-BC CWON Wound, Ostomy, Continence Nurse Pager [phone]

## 2018-01-16 NOTE — Progress Notes (Signed)
[facility] at [place] NAME: Jeanise Durfey    MR#:  [id number]  DATE OF BIRTH:  1970/11/15  SUBJECTIVE:  CHIEF COMPLAINT:   Chief Complaint  Patient presents with  . Abdominal Pain   Status post colectomy and end colostomy on 01/13/2018. Continues to have significant pain with some improvement compared to yesterday No vomiting. Being advanced to soft diet by surgery team  REVIEW OF SYSTEMS:   ROS CONSTITUTIONAL: No fever, fatigue or weakness.  EYES: No blurred or double vision.  EARS, NOSE, AND THROAT: No tinnitus or ear pain.  RESPIRATORY: No cough, shortness of breath, wheezing or hemoptysis.  CARDIOVASCULAR: No chest pain, orthopnea, edema.  GASTROINTESTINAL: Nausea, abdominal pain GENITOURINARY: No dysuria, hematuria.  ENDOCRINE: No polyuria, nocturia,  HEMATOLOGY: No anemia, easy bruising or bleeding SKIN: No rash or lesion. MUSCULOSKELETAL: No joint pain or arthritis.   NEUROLOGIC: No tingling, numbness, weakness.  PSYCHIATRY: No anxiety or depression.   DRUG ALLERGIES:   Allergies  Allergen Reactions  . Other Nausea And Vomiting    Anti-depressent that starts with t    VITALS:  Blood pressure 119/69, pulse 67, temperature 98.3 F (36.8 C), resp. rate 16, height 5\' 3"  (1.6 m), weight 97.5 kg, SpO2 95 %.  PHYSICAL EXAMINATION:  GENERAL:  47 y.o.-year-old patient lying in the bed with no acute distress.  EYES: Pupils equal, round, reactive to light and accommodation. No scleral icterus. Extraocular muscles intact.  HEENT: Head atraumatic, normocephalic. Oropharynx and nasopharynx clear.  NECK:  Supple, no jugular venous distention. No thyroid enlargement, no tenderness.  LUNGS: Had expiratory wheeze in all lung's yesterday but mostly clear today. CARDIOVASCULAR: S1, S2 normal. No murmurs, rubs, or gallops.  ABDOMEN: Colostomy site looks clean.  Diffusely tender but soft Foley catheter in place EXTREMITIES:  No pedal edema, cyanosis, or clubbing.  NEUROLOGIC: Awake alert and oriented x3 sensation intact. Gait not checked.  PSYCHIATRIC: The patient is alert and oriented x 3.  SKIN: No obvious rash, lesion, or ulcer.   LABORATORY PANEL:   CBC Recent Labs  Lab 01/15/18 0401  WBC 8.9  HGB 10.1*  HCT 33.8*  PLT 209   ------------------------------------------------------------------------------------------------------------------  Chemistries  Recent Labs  Lab 01/12/18 0539  01/15/18 0401  NA 143   < > 140  K 4.4   < > 3.4*  CL 94*   < > 95*  CO2 41*   < > 38*  GLUCOSE 96   < > 102*  BUN 11   < > 9  CREATININE 0.48   < > 0.50  CALCIUM 9.1   < > 8.3*  MG 2.8*  --  2.0  AST 39  --   --   ALT 52*  --   --   ALKPHOS 40  --   --   BILITOT 0.4  --   --    < > = values in this interval not displayed.   ------------------------------------------------------------------------------------------------------------------  Cardiac Enzymes No results for input(s): TROPONINI in the last 168 hours. ------------------------------------------------------------------------------------------------------------------  RADIOLOGY:  No results found.  EKG:   Orders placed or performed during the hospital encounter of 03/21/16  . EKG 12-Lead  . EKG 12-Lead  . EKG 12-Lead  . EKG 12-Lead  . EKG 12-Lead    ASSESSMENT AND PLAN:   * Recurrent sigmoid colon diverticulitis Did not improve with IV antibiotics. Status post colectomy and end colostomy on 01/13/2018 Postop day 3 Restarted IV Zosyn due  to significant infection found during surgery Pain medications as needed  *Acute blood loss anemia status post abdominal surgery.  Monitor.  No need for transfusion today.  * COPD exacerbation Much improved.  On prednisone Will stop prednisone  * History of chronic leg edema,  Lasix . * Neuropathy: Continue Neurontin.   * Depression, chronic pain issues .  Continue current management of  pain with IV morphine and oxycodone as needed basis  * suspected entero vesical fistula during surgery  Foley catheter being discontinued by surgery  All the records are reviewed and case discussed with Care Management/Social Worker Management plans discussed with the patient, she is in agreement  CODE STATUS: Full code  TOTAL TIME TAKING CARE OF THIS PATIENT: 30 minutes.   POSSIBLE D/C IN 2-3 DAYS, DEPENDING ON CLINICAL CONDITION.  [name] Levetta Bognar M.D on 01/16/2018 at 12:24 PM  Between 7am to 6pm - Pager - [phone]  After 6pm go to [email or web address] - password [email or web address] [facility]  Office  [phone]  CC: Primary care physician; Sinda Du, MD   Note: This dictation was prepared with Dragon dictation along with smaller phrase technology. Any transcriptional errors that result from this process are unintentional.

## 2018-01-16 NOTE — Plan of Care (Signed)
PRN IV pain medication has been decreased. No falls. The patient has ambulated in the hallway. Ostomy bag changed with WOCN. IV antibiotics provided. Family at bedside. On 1L of oxygen.  Problem: Education: Goal: Knowledge of General Education information will improve Description Including pain rating scale, medication(s)/side effects and non-pharmacologic comfort measures Outcome: Progressing   Problem: Health Behavior/Discharge Planning: Goal: Ability to manage health-related needs will improve Outcome: Progressing   Problem: Clinical Measurements: Goal: Ability to maintain clinical measurements within normal limits will improve Outcome: Progressing Goal: Will remain free from infection Outcome: Progressing Goal: Diagnostic test results will improve Outcome: Progressing Goal: Respiratory complications will improve Outcome: Progressing Goal: Cardiovascular complication will be avoided Outcome: Progressing   Problem: Activity: Goal: Risk for activity intolerance will decrease Outcome: Progressing   Problem: Nutrition: Goal: Adequate nutrition will be maintained Outcome: Progressing   Problem: Coping: Goal: Level of anxiety will decrease Outcome: Progressing   Problem: Elimination: Goal: Will not experience complications related to bowel motility Outcome: Progressing Goal: Will not experience complications related to urinary retention Outcome: Progressing   Problem: Pain Managment: Goal: General experience of comfort will improve Outcome: Progressing   Problem: Safety: Goal: Ability to remain free from injury will improve Outcome: Progressing   Problem: Skin Integrity: Goal: Risk for impaired skin integrity will decrease Outcome: Progressing

## 2018-01-17 LAB — CBC WITH DIFFERENTIAL/PLATELET
Abs Immature Granulocytes: 0.06 10*3/uL (ref 0.00–0.07)
BASOS ABS: 0 10*3/uL (ref 0.0–0.1)
Basophils Relative: 0 %
Eosinophils Absolute: 0.4 10*3/uL (ref 0.0–0.5)
Eosinophils Relative: 6 %
HCT: 33 % — ABNORMAL LOW (ref 36.0–46.0)
Hemoglobin: 9.9 g/dL — ABNORMAL LOW (ref 12.0–15.0)
Immature Granulocytes: 1 %
LYMPHS ABS: 1.5 10*3/uL (ref 0.7–4.0)
Lymphocytes Relative: 20 %
MCH: 30.9 pg (ref 26.0–34.0)
MCHC: 30 g/dL (ref 30.0–36.0)
MCV: 103.1 fL — ABNORMAL HIGH (ref 80.0–100.0)
Monocytes Absolute: 0.7 10*3/uL (ref 0.1–1.0)
Monocytes Relative: 9 %
NRBC: 0 % (ref 0.0–0.2)
Neutro Abs: 4.8 10*3/uL (ref 1.7–7.7)
Neutrophils Relative %: 64 %
Platelets: 234 10*3/uL (ref 150–400)
RBC: 3.2 MIL/uL — ABNORMAL LOW (ref 3.87–5.11)
RDW: 14.9 % (ref 11.5–15.5)
WBC: 7.4 10*3/uL (ref 4.0–10.5)

## 2018-01-17 LAB — SURGICAL PATHOLOGY

## 2018-01-17 LAB — BASIC METABOLIC PANEL
Anion gap: 6 (ref 5–15)
BUN: 11 mg/dL (ref 6–20)
CO2: 36 mmol/L — ABNORMAL HIGH (ref 22–32)
Calcium: 9 mg/dL (ref 8.9–10.3)
Chloride: 97 mmol/L — ABNORMAL LOW (ref 98–111)
Creatinine, Ser: 0.7 mg/dL (ref 0.44–1.00)
GFR calc Af Amer: >60 mL/min (ref >60)
GFR calc non Af Amer: >60 mL/min (ref >60)
Glucose, Bld: 88 mg/dL (ref 70–99)
Potassium: 4.6 mmol/L (ref 3.5–5.1)
Sodium: 139 mmol/L (ref 135–145)

## 2018-01-17 MED ORDER — OXYCODONE HCL 5 MG PO TABS
10.0000 mg | ORAL_TABLET | ORAL | Status: DC | PRN
  Administered 2018-01-17 – 2018-01-18 (×5): 10 mg via ORAL
  Filled 2018-01-17 (×5): qty 2

## 2018-01-17 NOTE — Plan of Care (Signed)

## 2018-01-17 NOTE — Progress Notes (Addendum)
SURGICAL PROGRESS NOTE  Patient seen and examined as described below with surgical PA-C, Ardell Isaacs.  Assessment/Plan: (ICD-10's: K69.20) 47 y.o. female with resolving post-surgical ileus 4 Days Post-Op s/p sigmoid colectomy with creation of end-colostomy and takedown of splenic flexure for persistent sigmoid diverticulitis despite antibiotic management, complicated by pertinent comorbidities including history of until recently chronic opiates abuse/dependence, obesity (BMI 34), COPD, chronic constipation, and chronic ongoing tobacco abuse (smoking).              - regular diet, PO meds             - monitor bowel function and abdominal exam             - pain control prn (PO meds, minimize narcotics as able)             - DVT prophylaxis, ambulation strongly encouraged, smoking cessation likewise encouraged             - oral antibiotics, medical management of comorbidities as per medical team  - RLQ drain removed, outpatient surgical follow-up in 1 week             - okay for discharge for surgical perspective  All of the above findings and recommendations were discussed with the patient and patient's RN, and all of patient's questions were answered to her expressed satisfaction.  -- Marilynne Drivers Rosana Hoes, MD, [place]: [facility] - Partnering for exceptional care. Office: [facility] Day(s): [place] op day(s): 4 Days Post-Op.   Interval History: Patient seen and examined, no acute events or new complaints overnight. Patient reports that she is still have abdominal pain worse near her incision which is exacerbated with movement. She denied any fevers, chills, chest pain, SOB, nausea, or emesis. She tolerated a soft diet well and ostomy outputting stool. Mobilized well. She is still nervous about going home and feels that she is not ready. Still required IV pain medications  yesterday and again encourage weaning from these today.   Review of Systems:  Constitutional: denies fever, chills  Respiratory: denies any shortness of breath  Cardiovascular: denies chest pain or palpitations  Gastrointestinal: + abdominal pain, denied N/V, or diarrhea/and bowel function as per interval history  Vital signs in last 24 hours: [min-max] current  Temp:  [97.6 F (36.4 C)-98.3 F (36.8 C)] 97.6 F (36.4 C) (12/31 0408) Pulse Rate:  [58-84] 58 (12/31 0408) Resp:  [18-20] 18 (12/31 0408) BP: (108-126)/(57-69) 108/57 (12/31 0408) SpO2:  [84 %-96 %] 93 % (12/31 0408) Weight:  [95 kg] 95 kg (12/31 0548)     Height: 5\' 3"  (160 cm) Weight: 95 kg BMI (Calculated): 37.11   Intake/Output this shift:  No intake/output data recorded.   Intake/Output last 2 shifts:  @IOLAST2SHIFTS @   Physical Exam:  Constitutional: alert, cooperative and no distress  Respiratory: breathing non-labored at rest  Cardiovascular: regular rate and sinus rhythm  Gastrointestinal: soft, incisional tenderness, and non-distended. Colostomy in RLQ, patent, somewhat edematous, brown stool in bag Integumentary: Midline laparotomy incision is CDI, no erythema or drainage, honeycomb dressing in place  Labs:  CBC Latest Ref Rng & Units 01/15/2018 01/14/2018 01/12/2018  WBC 4.0 - 10.5 K/uL 8.9 10.3 8.5  Hemoglobin 12.0 - 15.0 g/dL 10.1(L) 10.8(L) 12.4  Hematocrit 36.0 - 46.0 % 33.8(L) 35.4(L) 41.9  Platelets 150 - 400 K/uL 209 238 269   CMP Latest Ref Rng &  Units 01/15/2018 01/14/2018 01/12/2018  Glucose 70 - 99 mg/dL 102(H) 129(H) 96  BUN 6 - 20 mg/dL 9 10 11   Creatinine 0.44 - 1.00 mg/dL 0.50 0.39(L) 0.48  Sodium 135 - 145 mmol/L 140 138 143  Potassium 3.5 - 5.1 mmol/L 3.4(L) 4.4 4.4  Chloride 98 - 111 mmol/L 95(L) 97(L) 94(L)  CO2 22 - 32 mmol/L 38(H) 37(H) 41(H)  Calcium 8.9 - 10.3 mg/dL 8.3(L) 8.0(L) 9.1  Total Protein 6.5 - 8.1 g/dL - - 7.4  Total Bilirubin 0.3 - 1.2 mg/dL - - 0.4  Alkaline  Phos 38 - 126 U/L - - 40  AST 15 - 41 U/L - - 39  ALT 0 - 44 U/L - - 52(H)      Assessment/Plan: (ICD-10's: K52.20) 47 y.o. female with difficult pain control who is otherwise doing well overall 4 Days Post-Op s/p sigmoid colectomy with creation of end-colostomy and takedown of splenic flexure for persistent sigmoid diverticulitis despite antibiotic management, complicated by pertinent comorbidities including history of until recently chronic opiates abuse/dependence, obesity (BMI 34), COPD, chronic constipation, and chronic ongoing tobacco abuse (smoking).    - Pain control remains limiting factor, wean from IV medications as much as possible   - Continue soft diet   - Monitor abdominal examination and on going bowel function   - Mobilize  - DVT Prophylaxis  - Medical management per primary team, appreciate their help    - Discharge planning: From a surgical standpoint the patient is cleared for discharge however pain continues to be her limiting factor, will need to wean from IV medications prior to sending home.   All of the above findings and recommendations were discussed with the patient, and the medical team, and all of patient's questions were answered to her expressed satisfaction.  -- Edison Simon, PA-C [facility] 01/17/2018, 8:52 AM [phone] M-F: 7am - 4pm

## 2018-01-17 NOTE — Consult Note (Signed)
[place] Nurse ostomy follow up Stoma type/location: LLQ end colostomy Patient is asleep and spouse is not at bedside. States she is in pain at this time. Patient will need ongoing teaching for ostomy care.  Recommend [place] for teaching after discharge. Supplies left at bedside and pouch is intact.  Enrolled in secure start today.  [place] team will follow.  Domenic Moras MSN, RN, FNP-BC CWON Wound, Ostomy, Continence Nurse Pager [phone]  .

## 2018-01-17 NOTE — Progress Notes (Signed)
[place] [facility] [place] NAME: Avry Monteleone    MR#:  [id number]  DATE OF BIRTH:  [birth year]  SUBJECTIVE:  CHIEF COMPLAINT:   Chief Complaint  Patient presents with  . Abdominal Pain   Status post colectomy and end colostomy on 01/13/2018. Continues to have pain  No vomiting. Tolerating diet Afebrile  REVIEW OF SYSTEMS:   ROS CONSTITUTIONAL: No fever, fatigue or weakness.  EYES: No blurred or double vision.  EARS, NOSE, AND THROAT: No tinnitus or ear pain.  RESPIRATORY: No cough, shortness of breath, wheezing or hemoptysis.  CARDIOVASCULAR: No chest pain, orthopnea, edema.  GASTROINTESTINAL: Nausea, abdominal pain GENITOURINARY: No dysuria, hematuria.  ENDOCRINE: No polyuria, nocturia,  HEMATOLOGY: No anemia, easy bruising or bleeding SKIN: No rash or lesion. MUSCULOSKELETAL: No joint pain or arthritis.   NEUROLOGIC: No tingling, numbness, weakness.  PSYCHIATRY: No anxiety or depression.   DRUG ALLERGIES:   Allergies  Allergen Reactions  . Other Nausea And Vomiting    Anti-depressent that starts with t    VITALS:  Blood pressure (!) 108/57, pulse 69, temperature 97.6 F (36.4 C), temperature source Oral, resp. rate 18, height 5\' 3"  (1.6 m), weight 95 kg, SpO2 95 %.  PHYSICAL EXAMINATION:  GENERAL:  47 y.o.-year-old patient lying in the bed with no acute distress.  EYES: Pupils equal, round, reactive to light and accommodation. No scleral icterus. Extraocular muscles intact.  HEENT: Head atraumatic, normocephalic. Oropharynx and nasopharynx clear.  NECK:  Supple, no jugular venous distention. No thyroid enlargement, no tenderness.  LUNGS: Had expiratory wheeze in all lung's yesterday but mostly clear today. CARDIOVASCULAR: S1, S2 normal. No murmurs, rubs, or gallops.  ABDOMEN: Colostomy site looks clean.  Diffusely tender but soft Foley catheter in place EXTREMITIES: No pedal edema, cyanosis, or clubbing.   NEUROLOGIC: Awake alert and oriented x3 sensation intact. Gait not checked.  PSYCHIATRIC: The patient is alert and oriented x 3.  SKIN: No obvious rash, lesion, or ulcer.   LABORATORY PANEL:   CBC Recent Labs  Lab 01/15/18 0401  WBC 8.9  HGB 10.1*  HCT 33.8*  PLT 209   ------------------------------------------------------------------------------------------------------------------  Chemistries  Recent Labs  Lab 01/12/18 0539  01/15/18 0401  NA 143   < > 140  K 4.4   < > 3.4*  CL 94*   < > 95*  CO2 41*   < > 38*  GLUCOSE 96   < > 102*  BUN 11   < > 9  CREATININE 0.48   < > 0.50  CALCIUM 9.1   < > 8.3*  MG 2.8*  --  2.0  AST 39  --   --   ALT 52*  --   --   ALKPHOS 40  --   --   BILITOT 0.4  --   --    < > = values in this interval not displayed.   ------------------------------------------------------------------------------------------------------------------  Cardiac Enzymes No results for input(s): TROPONINI in the last 168 hours. ------------------------------------------------------------------------------------------------------------------  RADIOLOGY:  No results found.  EKG:   Orders placed or performed during the hospital encounter of 03/21/16  . EKG 12-Lead  . EKG 12-Lead  . EKG 12-Lead  . EKG 12-Lead  . EKG 12-Lead    ASSESSMENT AND PLAN:   * Recurrent sigmoid colon diverticulitis Did not improve with IV antibiotics. Status post colectomy and end colostomy on 01/13/2018 Postop day 4 Restarted IV Zosyn due to significant infection found during surgery. Stop  today. Pain medications as needed.  *Acute blood loss anemia status post abdominal surgery.  Monitor. No need for transfusion today.  * COPD exacerbation Much improved.   Off prednisone  * History of chronic leg edema,  Lasix .  * Neuropathy: Continue Neurontin.   * Depression, chronic pain issues .  Continue pain meds PRN  All the records are reviewed and case discussed  with Care Management/Social Worker Management plans discussed with the patient, she is in agreement  CODE STATUS: Full code  TOTAL TIME TAKING CARE OF THIS PATIENT: 30 minutes.   POSSIBLE D/C IN 2-3 DAYS, DEPENDING ON CLINICAL CONDITION.  [name] Cj Beecher M.D on 01/17/2018 at 1:26 PM  Between 7am to 6pm - Pager - [phone]  After 6pm go to [email or web address] - password [email or web address] [facility]  Office  [phone]  CC: Primary care physician; Sinda Du, MD   Note: This dictation was prepared with Dragon dictation along with smaller phrase technology. Any transcriptional errors that result from this process are unintentional.

## 2018-01-17 NOTE — Progress Notes (Signed)
Nutrition Follow Up Note   DOCUMENTATION CODES:   Obesity unspecified  INTERVENTION:   Ensure Enlive po BID, each supplement provides 350 kcal and 20 grams of protein  Continue daily MVI  NUTRITION DIAGNOSIS:   Inadequate oral intake related to decreased appetite, acute illness(diverticulitis) as evidenced by per patient/family report.  GOAL:   Patient will meet greater than or equal to 90% of their needs  -progressing   MONITOR:   PO intake, Supplement acceptance, Labs, Weight trends, I & O's  ASSESSMENT:   47 year old female with PMHx of COPD, opiate abuse, and PSHx of back surgery, C-section, abdominal hysterectomy, incisional hernia repair, laparoscopic cholecystectomy who is now admitted for recurrent sigmoid colon diverticulitis now s/p 3 rounds of antibiotics.   Pt s/p sigmoid colectomy with creation of end-colostomy and takedown of splenic flexure 12/27  Pt with good appetite and oral intake since admit. Pt on soft diet. Pt eating 100% of meals and drinking some Ensure. Pt's weight trending back down but still ~22lbs up from admit weight. Pt with moderate edema in BLE. UOP 2456ml x 24 hrs. Recommend continue supplements and MVI to support post op healing. Monitor weights.   Medications reviewed and include: Lovenox, lasix, MVI, nicotine, protonix, vitamin C, zosyn, morphine, oxycodone   Labs reviewed: none recent  -Diet Order:   Diet Order            DIET SOFT Room service appropriate? Yes; Fluid consistency: Thin  Diet effective now             EDUCATION NEEDS:   Education needs have been addressed  Skin:  Skin Assessment: Reviewed RN Assessment  Last BM:  12/7- type 7 via ostomy   Height:   Ht Readings from Last 1 Encounters:  01/13/18 5\' 3"  (1.6 m)   Weight:   Wt Readings from Last 1 Encounters:  01/17/18 95 kg   Ideal Body Weight:  52.3 kg  BMI:  Body mass index is 37.1 kg/m.  Estimated Nutritional Needs:   Kcal:  1740-2030 (MSJ x  1.2-1.4)  Protein:  85-95 grams (1-1.1 grams/kg)  Fluid:  1.7-2 L/day (1 mL/kcal)  Koleen Distance MS, RD, LDN Pager #- [phone] Office#- [phone] After Hours Pager: [phone]

## 2018-01-18 LAB — URINALYSIS, ROUTINE W REFLEX MICROSCOPIC
Bilirubin Urine: NEGATIVE
Glucose, UA: NEGATIVE mg/dL
Hgb urine dipstick: NEGATIVE
Ketones, ur: NEGATIVE mg/dL
Leukocytes, UA: NEGATIVE
Nitrite: NEGATIVE
Protein, ur: NEGATIVE mg/dL
Specific Gravity, Urine: 1.003 — ABNORMAL LOW (ref 1.005–1.030)
pH: 8 (ref 5.0–8.0)

## 2018-01-18 MED ORDER — AMOXICILLIN-POT CLAVULANATE 875-125 MG PO TABS: 1.0000 | ORAL_TABLET | Freq: Two times a day (BID) | ORAL | 0 refills | Status: DC

## 2018-01-18 MED ORDER — CELECOXIB 200 MG PO CAPS
200.0000 mg | ORAL_CAPSULE | Freq: Every day | ORAL | Status: DC
  Administered 2018-01-18: 200 mg via ORAL
  Filled 2018-01-18 (×2): qty 1

## 2018-01-18 MED ORDER — ACETAMINOPHEN 325 MG PO TABS
650.0000 mg | ORAL_TABLET | Freq: Four times a day (QID) | ORAL | Status: DC
  Administered 2018-01-18: 650 mg via ORAL
  Filled 2018-01-18: qty 2

## 2018-01-18 MED ORDER — METOCLOPRAMIDE HCL 5 MG/ML IJ SOLN
10.0000 mg | Freq: Once | INTRAMUSCULAR | Status: AC
  Administered 2018-01-18: 10 mg via INTRAVENOUS
  Filled 2018-01-18: qty 2

## 2018-01-18 MED ORDER — METAXALONE 800 MG PO TABS
400.0000 mg | ORAL_TABLET | Freq: Three times a day (TID) | ORAL | Status: DC | PRN
  Administered 2018-01-18: 400 mg via ORAL
  Filled 2018-01-18 (×3): qty 0.5

## 2018-01-18 MED ORDER — AMOXICILLIN-POT CLAVULANATE 875-125 MG PO TABS
1.0000 | ORAL_TABLET | Freq: Two times a day (BID) | ORAL | Status: DC
  Filled 2018-01-18: qty 1

## 2018-01-18 MED ORDER — METAXALONE 400 MG PO TABS: 400.0000 mg | ORAL_TABLET | Freq: Three times a day (TID) | ORAL | 0 refills | Status: DC | PRN

## 2018-01-18 MED ORDER — CELECOXIB 200 MG PO CAPS: 200.0000 mg | ORAL_CAPSULE | Freq: Every day | ORAL | 0 refills | Status: DC

## 2018-01-18 MED ORDER — OXYCODONE HCL 10 MG PO TABS: 10.0000 mg | ORAL_TABLET | ORAL | 0 refills | Status: DC | PRN

## 2018-01-18 MED ORDER — SIMETHICONE 80 MG PO CHEW
80.0000 mg | CHEWABLE_TABLET | Freq: Four times a day (QID) | ORAL | Status: DC | PRN
  Administered 2018-01-18: 80 mg via ORAL
  Filled 2018-01-18 (×2): qty 1

## 2018-01-18 NOTE — Progress Notes (Signed)
Subjective:  CC:  Misty Green is a 48 y.o. female  Hospital stay day 12, 5 Days Post-Op hartman's  HPI: Pt states pain is uncontrolled.  Cannot sleep.  Worse with moving and sitting up, localized to incision area.  Percocet gives temporarily relief.  Nothing else seems to help.  ROS:  A 5 point review of systems was performed and pertinent positives and negatives noted in HPI.   Objective:      Temp:  [98.1 F (36.7 C)-98.7 F (37.1 C)] 98.1 F (36.7 C) (01/01 0351) Pulse Rate:  [66-84] 72 (01/01 0351) Resp:  [16-20] 20 (01/01 0351) BP: (106-109)/(56-76) 106/76 (01/01 0351) SpO2:  [92 %-98 %] 95 % (01/01 0351) FiO2 (%):  [32 %] 32 % (12/31 1937) Weight:  [96.1 kg] 96.1 kg (01/01 0351)     Height: 5\' 3"  (160 cm) Weight: 96.1 kg BMI (Calculated): 37.54   Intake/Output this shift:   Intake/Output Summary (Last 24 hours) at 01/18/2018 1016 Last data filed at 01/18/2018 0656 Gross per 24 hour  Intake 598 ml  Output 3720 ml  Net -3122 ml        Constitutional :  alert, cooperative, appears stated age and no distress  Respiratory:  clear to auscultation bilaterally  Cardiovascular:  regular rate and rhythm  Gastrointestinal: incision staples intact.  some bruising noted on RLQ area from lovenox injections.  former drain site still patent, draining some serous fluid.  ostomy intact, with mucosa red, slightly protruded and edematous, but productive.  no foca tenderss around the ostomy.  .   Skin: Cool and moist.   Psychiatric: Normal affect, non-agitated, not confused       LABS:  CMP Latest Ref Rng & Units 01/17/2018 01/15/2018 01/14/2018  Glucose 70 - 99 mg/dL 88 102(H) 129(H)  BUN 6 - 20 mg/dL 11 9 10   Creatinine 0.44 - 1.00 mg/dL 0.70 0.50 0.39(L)  Sodium 135 - 145 mmol/L 139 140 138  Potassium 3.5 - 5.1 mmol/L 4.6 3.4(L) 4.4  Chloride 98 - 111 mmol/L 97(L) 95(L) 97(L)  CO2 22 - 32 mmol/L 36(H) 38(H) 37(H)  Calcium 8.9 - 10.3 mg/dL 9.0 8.3(L) 8.0(L)  Total  Protein 6.5 - 8.1 g/dL - - -  Total Bilirubin 0.3 - 1.2 mg/dL - - -  Alkaline Phos 38 - 126 U/L - - -  AST 15 - 41 U/L - - -  ALT 0 - 44 U/L - - -   CBC Latest Ref Rng & Units 01/17/2018 01/15/2018 01/14/2018  WBC 4.0 - 10.5 K/uL 7.4 8.9 10.3  Hemoglobin 12.0 - 15.0 g/dL 9.9(L) 10.1(L) 10.8(L)  Hematocrit 36.0 - 46.0 % 33.0(L) 33.8(L) 35.4(L)  Platelets 150 - 400 K/uL 234 209 238    RADS: n/a Assessment:   S/p hartman's.  Pain control still an issue per patient report, but she does have history of opioid use/dependence.  Discussed extensively with her goals of therapy and that pain likely is postoperative incisional pain.  No evidence of other pathology at this point.  Will add celebrex, skelaxin, simethicone, tylenol around the clock.  Pt still willing to be d/c'd if oral meds enough to make pain tolerable.  Will add U/A due to dysuria that developed this am.

## 2018-01-18 NOTE — Progress Notes (Signed)
01/18/2018 6:26 PM  Misty Green to be D/C'd Home per MD order.  Discussed prescriptions and follow up appointments with the patient. Prescriptions given to patient, medication list explained in detail. Pt verbalized understanding.  Allergies as of 01/18/2018      Reactions   Other Nausea And Vomiting   Anti-depressent that starts with t      Medication List    TAKE these medications   albuterol 108 (90 Base) MCG/ACT inhaler Commonly known as:  PROVENTIL HFA;VENTOLIN HFA Inhale into the lungs every 6 (six) hours as needed for wheezing or shortness of breath.   ALPRAZolam 1 MG tablet Commonly known as:  XANAX Take 1 mg by mouth 4 (four) times daily.   amoxicillin-clavulanate 875-125 MG tablet Commonly known as:  AUGMENTIN Take 1 tablet by mouth every 12 (twelve) hours.   celecoxib 200 MG capsule Commonly known as:  CELEBREX Take 1 capsule (200 mg total) by mouth daily.   Fluticasone-Salmeterol 250-50 MCG/DOSE Aepb Commonly known as:  ADVAIR Inhale 1 puff into the lungs 2 (two) times daily.   furosemide 40 MG tablet Commonly known as:  LASIX Take 40 mg by mouth daily as needed for fluid.   gabapentin 300 MG capsule Commonly known as:  NEURONTIN Take 800 mg by mouth 3 (three) times daily.   metaxalone 400 MG tablet Commonly known as:  SKELAXIN Take 1 tablet (400 mg total) by mouth every 8 (eight) hours as needed for muscle spasms.   multivitamin with minerals tablet Take 1 tablet by mouth daily.   Oxycodone HCl 10 MG Tabs Take 1 tablet (10 mg total) by mouth every 4 (four) hours as needed for severe pain.   roflumilast 500 MCG Tabs tablet Commonly known as:  DALIRESP Take 500 mcg by mouth daily.   tiotropium 18 MCG inhalation capsule Commonly known as:  SPIRIVA Place 18 mcg into inhaler and inhale daily.   vitamin C 1000 MG tablet Take 1,000 mg by mouth daily.   zolpidem 10 MG tablet Commonly known as:  AMBIEN Take 10 mg by mouth at bedtime as needed for  sleep.       Vitals:   01/18/18 0824 01/18/18 1325  BP:    Pulse:    Resp:    Temp:    SpO2: 95% 90%    Skin clean, dry and intact without evidence of skin break down, no evidence of skin tears noted. IV catheter discontinued intact. Site without signs and symptoms of complications. Dressing and pressure applied. Pt denies pain at this time. No complaints noted.  An After Visit Summary was printed and given to the patient. Patient escorted via [place], and D/C home via private auto.  Fuller Mandril, RN

## 2018-01-18 NOTE — Discharge Instructions (Signed)
Soft diet for 3-4 days and advance to regular diet.

## 2018-01-18 NOTE — Care Management Note (Signed)
Case Management Note  Patient Details  Name: Misty Green MRN: [id number] Date of Birth: December 16, 1970   Patient POD 5 Hartmans procedure.  Patient lives at home with husband.  PCP Luan Pulling.  Pharmacy [facility]. Patient denies issues obtaining medications or with transportation.  Patient and husband feel like they will be able to manage the ostomy care with out and home health support.  Both state they have been emptying the pouch,  But would like to be shown again how to change the wafer and the bag.    Patient states that her and her husband will be going to stay with her sister in [place] for a few weeks as an additional support ([address] [place]).  RNCM encouraged for sister to be present for education at the hospital for ostomy education as well.  Patient and husband declining home health servies at discharge.  RNCM informed them that should they change their mind prior to discharge to notify the [facility].    Bedside RN aware that patient has declined home health, and that they are requesting to be shown again how to change the wafer and pouch  Subjective/Objective:                    Action/Plan:   Expected Discharge Date:                  Expected Discharge Plan:     In-House Referral:     Discharge planning Services     Post Acute Care Choice:    Choice offered to:     DME Arranged:    DME Agency:     HH Arranged:    [place] Agency:     Status of Service:     If discussed at [employer], dates discussed:    Additional Comments:  Beverly Sessions, RN 01/18/2018, 11:18 AM

## 2018-01-19 ENCOUNTER — Telehealth: Payer: Self-pay | Admitting: *Deleted

## 2018-01-19 NOTE — Telephone Encounter (Signed)
Patient stated that she had surgery with Dr.Pabon on 01/13/18 and since last night when she left the hospital she has been vomiting, she is unable to keep any food or drink down. Patient stated that she thinks its from acid reflux but she is nausea as well.

## 2018-01-19 NOTE — Telephone Encounter (Signed)
Patient called and stated that she is now running a fever of 101.7. I spoke with Dr.Davis and if she is feeling poorly and with a tempeture over 101 then she needs to go to the emergency room to be evaluated. Patient is aware and will proceed with going to the emergency room.

## 2018-01-19 NOTE — Telephone Encounter (Signed)
Advised to try Prilosec daily for thinking its the acid reflex. Patient to call back if does not get better. Per Dr. Rosana Hoes

## 2018-01-20 ENCOUNTER — Other Ambulatory Visit: Payer: Self-pay | Admitting: *Deleted

## 2018-01-20 NOTE — Telephone Encounter (Signed)
Spoke with patient. She states she would like a refill on the Oxycodone. She states she will be out soon and wanted to call and get a refill.  We discussed taking Ibuprofen as well to help with the pain.  She is taking a 10 mg Oxycodone along with ibuprofen every 4 hours. '  We discussed taking the Oxycodone 6-8 hours and taking the ibuprofen in between and she is requesting the narcotic.  Patient was advised to go to the emergency room yesterday however she did not.   Patient was told to call Monday and we can speak with Dr.Pabon about her request.   Dr.Davis instructed patient to be seen in emergency room for fever of 101.7.   Patient stated she was not in any pain only acid reflux per patient.  She was instructed to take Prilosec.

## 2018-01-20 NOTE — Telephone Encounter (Signed)
Patient called asking if she could shower with having staples and was told yes. She was instructed to remove the old packing , shower and clean the wound with soap and water and pat dry. Replace wound with packing and cover with dressing.

## 2018-01-20 NOTE — Telephone Encounter (Signed)
Patient called and wanted to know if she can get another refill on oxycodone.

## 2018-01-26 ENCOUNTER — Encounter: Payer: 59 | Admitting: Surgery

## 2018-01-30 ENCOUNTER — Encounter: Payer: Self-pay | Admitting: Surgery

## 2018-01-30 ENCOUNTER — Other Ambulatory Visit: Payer: Self-pay

## 2018-01-30 ENCOUNTER — Ambulatory Visit: Payer: 59 | Admitting: Gastroenterology

## 2018-01-30 ENCOUNTER — Ambulatory Visit (INDEPENDENT_AMBULATORY_CARE_PROVIDER_SITE_OTHER): Payer: 59 | Admitting: Surgery

## 2018-01-30 VITALS — BP 136/66 | HR 85 | Temp 99.0°F | Resp 15 | Ht 63.0 in | Wt 184.0 lb

## 2018-01-30 DIAGNOSIS — Z09 Encounter for follow-up examination after completed treatment for conditions other than malignant neoplasm: Secondary | ICD-10-CM

## 2018-01-30 MED ORDER — CYCLOBENZAPRINE HCL 5 MG PO TABS: 5.0000 mg | ORAL_TABLET | Freq: Three times a day (TID) | ORAL | 0 refills | Status: DC

## 2018-01-30 MED ORDER — ONDANSETRON 4 MG PO TBDP: 4.0000 mg | ORAL_TABLET | Freq: Three times a day (TID) | ORAL | 0 refills | Status: DC | PRN

## 2018-01-30 NOTE — Patient Instructions (Signed)
Return in three weeks.. 

## 2018-01-31 NOTE — Discharge Summary (Signed)
[facility] NAME: Misty Green    MR#:  [id number]  DATE OF BIRTH:  [birth year]  DATE OF ADMISSION:  01/06/2018 ADMITTING PHYSICIAN: Dustin Flock, MD  DATE OF DISCHARGE: 01/18/2018  6:30 PM  PRIMARY CARE PHYSICIAN: Sinda Du, MD   ADMISSION DIAGNOSIS:  Diverticulitis [K57.92]  DISCHARGE DIAGNOSIS:  Active Problems:   Acute diverticulitis   SECONDARY DIAGNOSIS:   Past Medical History:  Diagnosis Date  . COPD (chronic obstructive pulmonary disease) ([place])   . Current smoker   . Opiate abuse, continuous ([place])      ADMITTING HISTORY  HISTORY OF PRESENT ILLNESS: Misty Green  is a 48 y.o. female with a known history of COPD,, nicotine abuse, with abdominal pain ongoing past 2 months according to her.  Patient has had multiple doses of antibiotics for diverticulitis.  She has been followed up by surgery as outpatient who presents back with complaint of left lower quadrant abdominal pain.  States that she just finished a course of antibiotics yesterday.  Patient has had 5 CT scans within the past 2 months.  HOSPITAL COURSE:   * Recurrent sigmoid colon diverticulitis Admitted to medical floor and started on broad-spectrum IV antibiotics.  No significant improvement.  Due to no improvement patient was taken to the operating room. Status post colectomy and end colostomy on 01/13/2018 Patient had slow recovery after surgery.  Due to significant purulent material found during surgery she was continued on IV Zosyn and transition to oral Augmentin at discharge.  Pain control was difficult.  By the day of discharge she is off IV pain medications and pain is well controlled with oral pain medications.  *Acute blood loss anemia status post abdominal surgery.   Did not need any blood transfusions.  * COPD exacerbation Treated with IV steroids, nebulizers and inhalers.  By day of discharge her wheezing is resolved.  She is not  needing oxygen.  Ambulated in the hallway without any trouble breathing.  Finished prednisone.  No need for steroids at discharge.  * History of chronic leg edema,  Lasix .  * Neuropathy: Continue Neurontin.   * Depression, chronic pain issues .  Continue pain meds PRN  Stable for discharge home.  Follow-up with primary care physician and surgery team as outpatient.  CONSULTS OBTAINED:  Treatment Team:  Fredirick Maudlin, MD Jules Husbands, MD Vickie Epley, MD  DRUG ALLERGIES:   Allergies  Allergen Reactions  . Other Nausea And Vomiting    Anti-depressent that starts with t    DISCHARGE MEDICATIONS:   Allergies as of 01/18/2018      Reactions   Other Nausea And Vomiting   Anti-depressent that starts with t      Medication List    TAKE these medications   albuterol 108 (90 Base) MCG/ACT inhaler Commonly known as:  PROVENTIL HFA;VENTOLIN HFA Inhale into the lungs every 6 (six) hours as needed for wheezing or shortness of breath.   ALPRAZolam 1 MG tablet Commonly known as:  XANAX Take 1 mg by mouth 4 (four) times daily.   amoxicillin-clavulanate 875-125 MG tablet Commonly known as:  AUGMENTIN Take 1 tablet by mouth every 12 (twelve) hours.   celecoxib 200 MG capsule Commonly known as:  CELEBREX Take 1 capsule (200 mg total) by mouth daily.   Fluticasone-Salmeterol 250-50 MCG/DOSE Aepb Commonly known as:  ADVAIR Inhale 1 puff into the lungs 2 (two) times daily.   furosemide 40 MG tablet  Commonly known as:  LASIX Take 40 mg by mouth daily as needed for fluid.   gabapentin 300 MG capsule Commonly known as:  NEURONTIN Take 800 mg by mouth 3 (three) times daily.   metaxalone 400 MG tablet Commonly known as:  SKELAXIN Take 1 tablet (400 mg total) by mouth every 8 (eight) hours as needed for muscle spasms.   multivitamin with minerals tablet Take 1 tablet by mouth daily.   roflumilast 500 MCG Tabs tablet Commonly known as:  DALIRESP Take 500 mcg by  mouth daily.   tiotropium 18 MCG inhalation capsule Commonly known as:  SPIRIVA Place 18 mcg into inhaler and inhale daily.   vitamin C 1000 MG tablet Take 1,000 mg by mouth daily.   zolpidem 10 MG tablet Commonly known as:  AMBIEN Take 10 mg by mouth at bedtime as needed for sleep.       Today   VITAL SIGNS:  Blood pressure 106/76, pulse 72, temperature 98.1 F (36.7 C), temperature source Oral, resp. rate 20, height 5\' 3"  (1.6 m), weight 96.1 kg, SpO2 90 %.  I/O:  No intake or output data in the 24 hours ending 01/31/18 1626  PHYSICAL EXAMINATION:  Physical Exam  GENERAL:  48 y.o.-year-old patient lying in the bed with no acute distress.  LUNGS: Normal breath sounds bilaterally, no wheezing, rales,rhonchi or crepitation. No use of accessory muscles of respiration.  CARDIOVASCULAR: S1, S2 normal. No murmurs, rubs, or gallops.  ABDOMEN: Soft, tenderness around surgical site.  Soft.  Bowel sounds present. NEUROLOGIC: Moves all 4 extremities. PSYCHIATRIC: The patient is alert and oriented x 3.  SKIN: No obvious rash, lesion, or ulcer.   DATA REVIEW:   CBC No results for input(s): WBC, HGB, HCT, PLT in the last 168 hours.  Chemistries  No results for input(s): NA, K, CL, CO2, GLUCOSE, BUN, CREATININE, CALCIUM, MG, AST, ALT, ALKPHOS, BILITOT in the last 168 hours.  Invalid input(s): GFRCGP  Cardiac Enzymes No results for input(s): TROPONINI in the last 168 hours.  Microbiology Results  Results for orders placed or performed during the hospital encounter of 12/03/17  Gastrointestinal Panel by PCR , Stool     Status: None   Collection Time: 12/03/17  4:23 PM  Result Value Ref Range Status   Campylobacter species NOT DETECTED NOT DETECTED Final   Plesimonas shigelloides NOT DETECTED NOT DETECTED Final   Salmonella species NOT DETECTED NOT DETECTED Final   Yersinia enterocolitica NOT DETECTED NOT DETECTED Final   Vibrio species NOT DETECTED NOT DETECTED Final    Vibrio cholerae NOT DETECTED NOT DETECTED Final   Enteroaggregative E coli (EAEC) NOT DETECTED NOT DETECTED Final   Enteropathogenic E coli (EPEC) NOT DETECTED NOT DETECTED Final   Enterotoxigenic E coli (ETEC) NOT DETECTED NOT DETECTED Final   Shiga like toxin producing E coli (STEC) NOT DETECTED NOT DETECTED Final   Shigella/Enteroinvasive E coli (EIEC) NOT DETECTED NOT DETECTED Final   Cryptosporidium NOT DETECTED NOT DETECTED Final   Cyclospora cayetanensis NOT DETECTED NOT DETECTED Final   Entamoeba histolytica NOT DETECTED NOT DETECTED Final   Giardia lamblia NOT DETECTED NOT DETECTED Final   Adenovirus F40/41 NOT DETECTED NOT DETECTED Final   Astrovirus NOT DETECTED NOT DETECTED Final   Norovirus GI/GII NOT DETECTED NOT DETECTED Final   Rotavirus A NOT DETECTED NOT DETECTED Final   Sapovirus (I, II, IV, and V) NOT DETECTED NOT DETECTED Final    Comment: Performed at [facility], [place],  [place], [place] [address]  C difficile quick scan w PCR reflex     Status: None   Collection Time: 12/03/17  4:23 PM  Result Value Ref Range Status   C Diff antigen NEGATIVE NEGATIVE Final   C Diff toxin NEGATIVE NEGATIVE Final   C Diff interpretation No C. difficile detected.  Final    Comment: Performed at [facility], [address] [place], [place] [address]  Gastrointestinal Panel by PCR , Stool     Status: None   Collection Time: 12/10/17  5:18 PM  Result Value Ref Range Status   Campylobacter species NOT DETECTED NOT DETECTED Final   Plesimonas shigelloides NOT DETECTED NOT DETECTED Final   Salmonella species NOT DETECTED NOT DETECTED Final   Yersinia enterocolitica NOT DETECTED NOT DETECTED Final   Vibrio species NOT DETECTED NOT DETECTED Final   Vibrio cholerae NOT DETECTED NOT DETECTED Final   Enteroaggregative E coli (EAEC) NOT DETECTED NOT DETECTED Final   Enteropathogenic E coli (EPEC) NOT DETECTED NOT DETECTED Final   Enterotoxigenic E coli (ETEC)  NOT DETECTED NOT DETECTED Final   Shiga like toxin producing E coli (STEC) NOT DETECTED NOT DETECTED Final   Shigella/Enteroinvasive E coli (EIEC) NOT DETECTED NOT DETECTED Final   Cryptosporidium NOT DETECTED NOT DETECTED Final   Cyclospora cayetanensis NOT DETECTED NOT DETECTED Final   Entamoeba histolytica NOT DETECTED NOT DETECTED Final   Giardia lamblia NOT DETECTED NOT DETECTED Final   Adenovirus F40/41 NOT DETECTED NOT DETECTED Final   Astrovirus NOT DETECTED NOT DETECTED Final   Norovirus GI/GII NOT DETECTED NOT DETECTED Final   Rotavirus A NOT DETECTED NOT DETECTED Final   Sapovirus (I, II, IV, and V) NOT DETECTED NOT DETECTED Final    Comment: Performed at [facility], [address], [place],  [address]    RADIOLOGY:  No results found.  Follow up with PCP in 1 week.  Management plans discussed with the patient, family and they are in agreement.  CODE STATUS:  Code Status History    Date Active Date Inactive Code Status Order ID Comments User Context   01/06/2018 2039 01/18/2018 2136 Full Code [id number]  Dustin Flock, MD Inpatient   12/03/2017 1840 12/13/2017 1740 Full Code [id number]  Henreitta Leber, MD Inpatient   03/22/2016 0059 03/24/2016 1705 Full Code [id number]  Norval Morton, MD ED      TOTAL TIME TAKING CARE OF THIS PATIENT ON DAY OF DISCHARGE: more than 30 minutes.   Neita Carp M.D on 01/31/2018 at 4:26 PM  Between 7am to 6pm - Pager - [phone]  After 6pm go to [email or web address] - password [email or web address] [facility]  Office  [phone]  CC: Primary care physician; Sinda Du, MD  Note: This dictation was prepared with Dragon dictation along with smaller phrase technology. Any transcriptional errors that result from this process are unintentional.

## 2018-02-01 ENCOUNTER — Encounter: Payer: Self-pay | Admitting: Surgery

## 2018-02-01 NOTE — Progress Notes (Signed)
S/p lap Hartmann's for chronic diverticulitis Path d/w pt She id doing well Ostomy working + PO  PE NAD Alert Abd: soft, nt, staples removed, no infection ostomy working and patent  A/P Doing very well w/o complications RTC 3-4 weeks No heavy lifting

## 2018-02-06 ENCOUNTER — Telehealth: Payer: Self-pay | Admitting: *Deleted

## 2018-02-06 NOTE — Telephone Encounter (Signed)
Patient called and stated that under her ring where the colostomy bag is attached. She stated that skin is very red and irritated. She stated that every time she takes it off it feels like her skin is ripping off. Patient denies drainage.

## 2018-02-06 NOTE — Telephone Encounter (Signed)
Offered patient to see nurse tomorrow . Patient will keep appt with Dr. [name].

## 2018-02-08 ENCOUNTER — Telehealth: Payer: Self-pay | Admitting: *Deleted

## 2018-02-08 NOTE — Telephone Encounter (Signed)
She called to let us know that she was still having trouble with her ostomy leaking and it is irritating her skin. She states she does not have home health coming to help her. Recommended her using the powder and extra ring where she feels it is leaking. She understands that this can take time to find what works best for her. I also mentioned using a ostomy belt to help give it support. She will shower twice a day to help heal the surrounding skin. Denies pain. Appreciates call. Follow up as scheduled.

## 2018-02-15 ENCOUNTER — Ambulatory Visit (INDEPENDENT_AMBULATORY_CARE_PROVIDER_SITE_OTHER): Payer: 59 | Admitting: Surgery

## 2018-02-15 ENCOUNTER — Other Ambulatory Visit: Payer: Self-pay

## 2018-02-15 ENCOUNTER — Encounter: Payer: Self-pay | Admitting: Surgery

## 2018-02-15 VITALS — BP 114/83 | HR 101 | Temp 98.6°F | Resp 16 | Ht 63.0 in | Wt 175.8 lb

## 2018-02-15 DIAGNOSIS — Z09 Encounter for follow-up examination after completed treatment for conditions other than malignant neoplasm: Secondary | ICD-10-CM

## 2018-02-15 MED ORDER — ONDANSETRON HCL 8 MG PO TABS: 8.0000 mg | ORAL_TABLET | Freq: Three times a day (TID) | ORAL | 0 refills | Status: DC | PRN

## 2018-02-15 NOTE — Progress Notes (Signed)
S/p hartmann's Doing very well Colostomy appliance now doing well, no leak Taking PO  PE NAD Abd: soft, nt, incision c/d/i, ostomy pink and patent  A/p Doing very well F/u 2 months at  That time we will reassess for colonoscopy, Dr Allen Norris  has seen her before and they were planning for outpt colonoscopy No complications

## 2018-02-15 NOTE — Patient Instructions (Addendum)
We will see you back in 2 months. You may resume driving a car.

## 2018-04-05 NOTE — Progress Notes (Signed)
Orders have been completed for [facility] and faxed over for the patient.

## 2018-04-17 ENCOUNTER — Other Ambulatory Visit: Payer: Self-pay

## 2018-04-17 ENCOUNTER — Ambulatory Visit (INDEPENDENT_AMBULATORY_CARE_PROVIDER_SITE_OTHER): Payer: 59 | Admitting: Surgery

## 2018-04-17 ENCOUNTER — Encounter: Payer: Self-pay | Admitting: *Deleted

## 2018-04-17 DIAGNOSIS — K5732 Diverticulitis of large intestine without perforation or abscess without bleeding: Secondary | ICD-10-CM

## 2018-04-17 NOTE — Progress Notes (Signed)
Referral made to [facility]. Patient needs a colonoscopy in 2-3 months per Dr. [name].   Patient will be placed in the recalls with Dr. [name] for 3 months (face to face visit).

## 2018-04-19 ENCOUNTER — Encounter: Payer: Self-pay | Admitting: Surgery

## 2018-04-19 NOTE — Progress Notes (Signed)
Telemedicine Surgical Follow Up  04/19/2018  Misty Green is an 48 y.o. female.   No chief complaint on file.   HPI:  S/p Jeanette Caprice for diverticulitis. Much better, no pain. osotomy working. HX of polyps and in need for colonoscopy before takedown. D/w the about covid -19 epidemic I would not recommend takedown for at least 2-3 months Location of the patient:home Time spent on call: 9 min Total Time spent in the encounter including counseling and coordination of care: 15 Location of the Provider: office The patient had given consent for a telemedicine visit and they understands the limitations associated with this including but not limited to privacy, cyber security and technology issues. Persons participating in the visit: pt    Past Medical History:  Diagnosis Date  . COPD (chronic obstructive pulmonary disease) ([place])   . Current smoker   . Opiate abuse, continuous ([place])     Past Surgical History:  Procedure Laterality Date  . ABDOMINAL HYSTERECTOMY    . BACK SURGERY    . cesction    . COLOSTOMY  01/13/2018   Procedure: COLOSTOMY Creation;  Surgeon: Jules Husbands, MD;  Location: [facility] ORS;  Service: General;;  . INCISIONAL HERNIA REPAIR     lower midline laparotomy incision (hysterectomy), repaired with mesh  . LAPAROSCOPIC CHOLECYSTECTOMY  2012  . LAPAROSCOPIC LYSIS OF ADHESIONS  01/13/2018   Procedure: LAPAROSCOPIC LYSIS OF ADHESIONS;  Surgeon: Jules Husbands, MD;  Location: [facility] ORS;  Service: General;;  . LAPAROSCOPIC SIGMOID COLECTOMY  01/13/2018   Procedure: LAPAROSCOPIC SIGMOID COLECTOMY;  Surgeon: Jules Husbands, MD;  Location: [facility] ORS;  Service: General;;  . ORIF WRIST FRACTURE Right 08/25/2015   Procedure: OPEN REDUCTION INTERNAL FIXATION (ORIF) WRIST FRACTURE;  Surgeon: Corky Mull, MD;  Location: [facility] ORS;  Service: Orthopedics;  Laterality: Right;    Family History  Problem Relation Age of Onset  . COPD Mother   . Hypertension Father   . Diabetes  Father     Social History:  reports that she has quit smoking. Her smoking use included cigarettes. She has a 15.00 pack-year smoking history. She has never used smokeless tobacco. She reports current drug use. Drug: Marijuana. She reports that she does not drink alcohol.  Allergies:  Allergies  Allergen Reactions  . Other Nausea And Vomiting    Anti-depressent that starts with t    Medications reviewed.    ROS Full ROS performed and is otherwise negative other than what is stated in HPI   Assessment/Plan: Colonoscopy in 2 months rtc 21/2 months after completion of above for surgical discussion  The pt was provided an opportunity to ask questions and all were answered. The pt was advised to call back or seek in-person evaluation if the symptoms worsen or if the condition fails to improve as anticipated. Greater than 50% of the 15 minutes  visit was spent in counseling/coordination of care   Caroleen Hamman, MD Railroad Surgeon

## 2018-05-05 ENCOUNTER — Telehealth: Payer: Self-pay

## 2018-05-05 NOTE — Telephone Encounter (Signed)
LVM for pt to call office to schedule her colonoscopy.  Thanks,  Sharyn Lull

## 2018-05-12 ENCOUNTER — Inpatient Hospital Stay (HOSPITAL_COMMUNITY)
Admission: EM | Admit: 2018-05-12 | Discharge: 2018-05-27 | DRG: 207 | Disposition: A | Discharge status: Rehabilitation Facility | Payer: 59 | Attending: Family Medicine | Admitting: Family Medicine

## 2018-05-12 ENCOUNTER — Emergency Department (HOSPITAL_COMMUNITY): Payer: 59

## 2018-05-12 ENCOUNTER — Encounter (HOSPITAL_COMMUNITY): Payer: Self-pay | Admitting: Emergency Medicine

## 2018-05-12 DIAGNOSIS — Z825 Family history of asthma and other chronic lower respiratory diseases: Secondary | ICD-10-CM

## 2018-05-12 DIAGNOSIS — R808 Other proteinuria: Secondary | ICD-10-CM | POA: Diagnosis present

## 2018-05-12 DIAGNOSIS — G8929 Other chronic pain: Secondary | ICD-10-CM | POA: Diagnosis present

## 2018-05-12 DIAGNOSIS — E669 Obesity, unspecified: Secondary | ICD-10-CM | POA: Diagnosis present

## 2018-05-12 DIAGNOSIS — Z8249 Family history of ischemic heart disease and other diseases of the circulatory system: Secondary | ICD-10-CM

## 2018-05-12 DIAGNOSIS — E875 Hyperkalemia: Secondary | ICD-10-CM | POA: Diagnosis not present

## 2018-05-12 DIAGNOSIS — R4189 Other symptoms and signs involving cognitive functions and awareness: Secondary | ICD-10-CM | POA: Diagnosis present

## 2018-05-12 DIAGNOSIS — J440 Chronic obstructive pulmonary disease with acute lower respiratory infection: Secondary | ICD-10-CM | POA: Diagnosis present

## 2018-05-12 DIAGNOSIS — G9341 Metabolic encephalopathy: Secondary | ICD-10-CM | POA: Diagnosis not present

## 2018-05-12 DIAGNOSIS — T424X5A Adverse effect of benzodiazepines, initial encounter: Secondary | ICD-10-CM | POA: Diagnosis present

## 2018-05-12 DIAGNOSIS — I5030 Unspecified diastolic (congestive) heart failure: Secondary | ICD-10-CM | POA: Diagnosis present

## 2018-05-12 DIAGNOSIS — R740 Nonspecific elevation of levels of transaminase and lactic acid dehydrogenase [LDH]: Secondary | ICD-10-CM | POA: Diagnosis not present

## 2018-05-12 DIAGNOSIS — Z20828 Contact with and (suspected) exposure to other viral communicable diseases: Secondary | ICD-10-CM | POA: Diagnosis present

## 2018-05-12 DIAGNOSIS — R570 Cardiogenic shock: Secondary | ICD-10-CM | POA: Diagnosis present

## 2018-05-12 DIAGNOSIS — Z6836 Body mass index (BMI) 36.0-36.9, adult: Secondary | ICD-10-CM

## 2018-05-12 DIAGNOSIS — N170 Acute kidney failure with tubular necrosis: Secondary | ICD-10-CM | POA: Diagnosis present

## 2018-05-12 DIAGNOSIS — I10 Essential (primary) hypertension: Secondary | ICD-10-CM | POA: Diagnosis not present

## 2018-05-12 DIAGNOSIS — F1290 Cannabis use, unspecified, uncomplicated: Secondary | ICD-10-CM | POA: Diagnosis present

## 2018-05-12 DIAGNOSIS — T380X5A Adverse effect of glucocorticoids and synthetic analogues, initial encounter: Secondary | ICD-10-CM | POA: Diagnosis not present

## 2018-05-12 DIAGNOSIS — G4733 Obstructive sleep apnea (adult) (pediatric): Secondary | ICD-10-CM | POA: Diagnosis present

## 2018-05-12 DIAGNOSIS — J14 Pneumonia due to Hemophilus influenzae: Secondary | ICD-10-CM | POA: Diagnosis present

## 2018-05-12 DIAGNOSIS — G92 Toxic encephalopathy: Secondary | ICD-10-CM | POA: Diagnosis present

## 2018-05-12 DIAGNOSIS — I469 Cardiac arrest, cause unspecified: Secondary | ICD-10-CM | POA: Diagnosis present

## 2018-05-12 DIAGNOSIS — R7401 Elevation of levels of liver transaminase levels: Secondary | ICD-10-CM

## 2018-05-12 DIAGNOSIS — K7200 Acute and subacute hepatic failure without coma: Secondary | ICD-10-CM | POA: Diagnosis present

## 2018-05-12 DIAGNOSIS — Z8719 Personal history of other diseases of the digestive system: Secondary | ICD-10-CM

## 2018-05-12 DIAGNOSIS — I132 Hypertensive heart and chronic kidney disease with heart failure and with stage 5 chronic kidney disease, or end stage renal disease: Secondary | ICD-10-CM | POA: Diagnosis present

## 2018-05-12 DIAGNOSIS — J441 Chronic obstructive pulmonary disease with (acute) exacerbation: Secondary | ICD-10-CM | POA: Diagnosis present

## 2018-05-12 DIAGNOSIS — J690 Pneumonitis due to inhalation of food and vomit: Secondary | ICD-10-CM | POA: Diagnosis present

## 2018-05-12 DIAGNOSIS — Z9989 Dependence on other enabling machines and devices: Secondary | ICD-10-CM

## 2018-05-12 DIAGNOSIS — Z9049 Acquired absence of other specified parts of digestive tract: Secondary | ICD-10-CM

## 2018-05-12 DIAGNOSIS — Z992 Dependence on renal dialysis: Secondary | ICD-10-CM | POA: Diagnosis not present

## 2018-05-12 DIAGNOSIS — Z66 Do not resuscitate: Secondary | ICD-10-CM | POA: Diagnosis present

## 2018-05-12 DIAGNOSIS — R5381 Other malaise: Secondary | ICD-10-CM | POA: Diagnosis not present

## 2018-05-12 DIAGNOSIS — J449 Chronic obstructive pulmonary disease, unspecified: Secondary | ICD-10-CM | POA: Diagnosis not present

## 2018-05-12 DIAGNOSIS — J9811 Atelectasis: Secondary | ICD-10-CM

## 2018-05-12 DIAGNOSIS — D638 Anemia in other chronic diseases classified elsewhere: Secondary | ICD-10-CM | POA: Diagnosis present

## 2018-05-12 DIAGNOSIS — M549 Dorsalgia, unspecified: Secondary | ICD-10-CM | POA: Diagnosis present

## 2018-05-12 DIAGNOSIS — Z9114 Patient's other noncompliance with medication regimen: Secondary | ICD-10-CM | POA: Diagnosis not present

## 2018-05-12 DIAGNOSIS — N186 End stage renal disease: Secondary | ICD-10-CM | POA: Diagnosis present

## 2018-05-12 DIAGNOSIS — E872 Acidosis, unspecified: Secondary | ICD-10-CM

## 2018-05-12 DIAGNOSIS — Z781 Physical restraint status: Secondary | ICD-10-CM

## 2018-05-12 DIAGNOSIS — Z933 Colostomy status: Secondary | ICD-10-CM

## 2018-05-12 DIAGNOSIS — J9621 Acute and chronic respiratory failure with hypoxia: Principal | ICD-10-CM | POA: Diagnosis present

## 2018-05-12 DIAGNOSIS — N179 Acute kidney failure, unspecified: Secondary | ICD-10-CM

## 2018-05-12 DIAGNOSIS — R451 Restlessness and agitation: Secondary | ICD-10-CM | POA: Diagnosis present

## 2018-05-12 DIAGNOSIS — J410 Simple chronic bronchitis: Secondary | ICD-10-CM | POA: Diagnosis not present

## 2018-05-12 DIAGNOSIS — R6521 Severe sepsis with septic shock: Secondary | ICD-10-CM | POA: Diagnosis present

## 2018-05-12 DIAGNOSIS — I5023 Acute on chronic systolic (congestive) heart failure: Secondary | ICD-10-CM | POA: Diagnosis not present

## 2018-05-12 DIAGNOSIS — R0789 Other chest pain: Secondary | ICD-10-CM | POA: Diagnosis not present

## 2018-05-12 DIAGNOSIS — Z23 Encounter for immunization: Secondary | ICD-10-CM | POA: Diagnosis not present

## 2018-05-12 DIAGNOSIS — R34 Anuria and oliguria: Secondary | ICD-10-CM | POA: Diagnosis not present

## 2018-05-12 DIAGNOSIS — J9602 Acute respiratory failure with hypercapnia: Secondary | ICD-10-CM | POA: Diagnosis not present

## 2018-05-12 DIAGNOSIS — J9690 Respiratory failure, unspecified, unspecified whether with hypoxia or hypercapnia: Secondary | ICD-10-CM

## 2018-05-12 DIAGNOSIS — A413 Sepsis due to Hemophilus influenzae: Secondary | ICD-10-CM | POA: Diagnosis present

## 2018-05-12 DIAGNOSIS — G931 Anoxic brain damage, not elsewhere classified: Secondary | ICD-10-CM | POA: Diagnosis present

## 2018-05-12 DIAGNOSIS — Z9911 Dependence on respirator [ventilator] status: Secondary | ICD-10-CM | POA: Diagnosis not present

## 2018-05-12 DIAGNOSIS — Z452 Encounter for adjustment and management of vascular access device: Secondary | ICD-10-CM

## 2018-05-12 DIAGNOSIS — J9601 Acute respiratory failure with hypoxia: Secondary | ICD-10-CM | POA: Diagnosis not present

## 2018-05-12 DIAGNOSIS — J9622 Acute and chronic respiratory failure with hypercapnia: Secondary | ICD-10-CM | POA: Diagnosis present

## 2018-05-12 DIAGNOSIS — R1310 Dysphagia, unspecified: Secondary | ICD-10-CM | POA: Diagnosis present

## 2018-05-12 DIAGNOSIS — Z90710 Acquired absence of both cervix and uterus: Secondary | ICD-10-CM

## 2018-05-12 DIAGNOSIS — R0989 Other specified symptoms and signs involving the circulatory and respiratory systems: Secondary | ICD-10-CM | POA: Diagnosis not present

## 2018-05-12 DIAGNOSIS — Z8674 Personal history of sudden cardiac arrest: Secondary | ICD-10-CM

## 2018-05-12 DIAGNOSIS — R9401 Abnormal electroencephalogram [EEG]: Secondary | ICD-10-CM | POA: Diagnosis present

## 2018-05-12 DIAGNOSIS — Z79899 Other long term (current) drug therapy: Secondary | ICD-10-CM

## 2018-05-12 DIAGNOSIS — Z9981 Dependence on supplemental oxygen: Secondary | ICD-10-CM | POA: Diagnosis not present

## 2018-05-12 DIAGNOSIS — F418 Other specified anxiety disorders: Secondary | ICD-10-CM | POA: Diagnosis present

## 2018-05-12 DIAGNOSIS — F1110 Opioid abuse, uncomplicated: Secondary | ICD-10-CM | POA: Diagnosis present

## 2018-05-12 DIAGNOSIS — Z888 Allergy status to other drugs, medicaments and biological substances status: Secondary | ICD-10-CM

## 2018-05-12 DIAGNOSIS — F411 Generalized anxiety disorder: Secondary | ICD-10-CM | POA: Diagnosis not present

## 2018-05-12 DIAGNOSIS — R739 Hyperglycemia, unspecified: Secondary | ICD-10-CM | POA: Diagnosis not present

## 2018-05-12 DIAGNOSIS — Z87891 Personal history of nicotine dependence: Secondary | ICD-10-CM

## 2018-05-12 DIAGNOSIS — Z833 Family history of diabetes mellitus: Secondary | ICD-10-CM

## 2018-05-12 DIAGNOSIS — M48061 Spinal stenosis, lumbar region without neurogenic claudication: Secondary | ICD-10-CM | POA: Diagnosis present

## 2018-05-12 DIAGNOSIS — R0902 Hypoxemia: Secondary | ICD-10-CM

## 2018-05-12 DIAGNOSIS — F068 Other specified mental disorders due to known physiological condition: Secondary | ICD-10-CM | POA: Diagnosis present

## 2018-05-12 HISTORY — DX: Cardiac arrest, cause unspecified: I46.9

## 2018-05-12 HISTORY — DX: Heart failure, unspecified: I50.9

## 2018-05-12 LAB — POCT I-STAT EG7
Acid-Base Excess: 6 mmol/L — ABNORMAL HIGH (ref 0.0–2.0)
Bicarbonate: 37 mmol/L — ABNORMAL HIGH (ref 20.0–28.0)
Calcium, Ion: 1.02 mmol/L — ABNORMAL LOW (ref 1.15–1.40)
HCT: 44 % (ref 36.0–46.0)
Hemoglobin: 15 g/dL (ref 12.0–15.0)
O2 Saturation: 73 %
Potassium: 4.7 mmol/L (ref 3.5–5.1)
Sodium: 134 mmol/L — ABNORMAL LOW (ref 135–145)
TCO2: 40 mmol/L — ABNORMAL HIGH (ref 22–32)
pCO2, Ven: 85.2 mmHg (ref 44.0–60.0)
pH, Ven: 7.246 — ABNORMAL LOW (ref 7.250–7.430)
pO2, Ven: 47 mmHg — ABNORMAL HIGH (ref 32.0–45.0)

## 2018-05-12 LAB — CBC WITH DIFFERENTIAL/PLATELET
Abs Immature Granulocytes: 0.86 10*3/uL — ABNORMAL HIGH (ref 0.00–0.07)
Basophils Absolute: 0 10*3/uL (ref 0.0–0.1)
Basophils Relative: 0 %
Eosinophils Absolute: 0 10*3/uL (ref 0.0–0.5)
Eosinophils Relative: 0 %
HCT: 46.5 % — ABNORMAL HIGH (ref 36.0–46.0)
Hemoglobin: 13.3 g/dL (ref 12.0–15.0)
Immature Granulocytes: 8 %
Lymphocytes Relative: 11 %
Lymphs Abs: 1.2 10*3/uL (ref 0.7–4.0)
MCH: 30.2 pg (ref 26.0–34.0)
MCHC: 28.6 g/dL — ABNORMAL LOW (ref 30.0–36.0)
MCV: 105.7 fL — ABNORMAL HIGH (ref 80.0–100.0)
Monocytes Absolute: 1.1 10*3/uL — ABNORMAL HIGH (ref 0.1–1.0)
Monocytes Relative: 9 %
Neutro Abs: 8.3 10*3/uL — ABNORMAL HIGH (ref 1.7–7.7)
Neutrophils Relative %: 72 %
Platelets: 294 10*3/uL (ref 150–400)
RBC: 4.4 MIL/uL (ref 3.87–5.11)
RDW: 12.7 % (ref 11.5–15.5)
WBC: 11.5 10*3/uL — ABNORMAL HIGH (ref 4.0–10.5)
nRBC: 2.4 % — ABNORMAL HIGH (ref 0.0–0.2)

## 2018-05-12 LAB — COMPREHENSIVE METABOLIC PANEL WITH GFR
ALT: 924 U/L — ABNORMAL HIGH (ref 0–44)
AST: 887 U/L — ABNORMAL HIGH (ref 15–41)
Albumin: 3 g/dL — ABNORMAL LOW (ref 3.5–5.0)
Alkaline Phosphatase: 105 U/L (ref 38–126)
Anion gap: 19 — ABNORMAL HIGH (ref 5–15)
BUN: 14 mg/dL (ref 6–20)
CO2: 32 mmol/L (ref 22–32)
Calcium: 9.4 mg/dL (ref 8.9–10.3)
Chloride: 89 mmol/L — ABNORMAL LOW (ref 98–111)
Creatinine, Ser: 1.4 mg/dL — ABNORMAL HIGH (ref 0.44–1.00)
GFR calc Af Amer: 51 mL/min — ABNORMAL LOW
GFR calc non Af Amer: 44 mL/min — ABNORMAL LOW
Glucose, Bld: 107 mg/dL — ABNORMAL HIGH (ref 70–99)
Potassium: 4.6 mmol/L (ref 3.5–5.1)
Sodium: 140 mmol/L (ref 135–145)
Total Bilirubin: 0.9 mg/dL (ref 0.3–1.2)
Total Protein: 7.4 g/dL (ref 6.5–8.1)

## 2018-05-12 LAB — I-STAT BETA HCG BLOOD, ED (MC, WL, AP ONLY): I-stat hCG, quantitative: <5 m[IU]/mL (ref <5)

## 2018-05-12 LAB — I-STAT CREATININE, ED: Creatinine, Ser: 1.2 mg/dL — ABNORMAL HIGH (ref 0.44–1.00)

## 2018-05-12 LAB — TRIGLYCERIDES: Triglycerides: 74 mg/dL

## 2018-05-12 LAB — LACTATE DEHYDROGENASE: LDH: 2286 U/L — ABNORMAL HIGH (ref 98–192)

## 2018-05-12 LAB — D-DIMER, QUANTITATIVE: D-Dimer, Quant: 3.34 ug{FEU}/mL — ABNORMAL HIGH (ref 0.00–0.50)

## 2018-05-12 LAB — FERRITIN: Ferritin: 6898 ng/mL — ABNORMAL HIGH (ref 11–307)

## 2018-05-12 LAB — C-REACTIVE PROTEIN: CRP: 34.6 mg/dL — ABNORMAL HIGH

## 2018-05-12 LAB — FIBRINOGEN: Fibrinogen: >800 mg/dL — ABNORMAL HIGH (ref 210–475)

## 2018-05-12 LAB — SARS CORONAVIRUS 2 BY RT PCR (HOSPITAL ORDER, PERFORMED IN ~~LOC~~ HOSPITAL LAB): SARS Coronavirus 2: NEGATIVE

## 2018-05-12 LAB — CBG MONITORING, ED: Glucose-Capillary: 95 mg/dL (ref 70–99)

## 2018-05-12 LAB — LACTIC ACID, PLASMA: Lactic Acid, Venous: 7.7 mmol/L (ref 0.5–1.9)

## 2018-05-12 LAB — PROCALCITONIN: Procalcitonin: 0.68 ng/mL

## 2018-05-12 MED ORDER — HEPARIN SODIUM (PORCINE) 5000 UNIT/ML IJ SOLN
5000.0000 [IU] | Freq: Three times a day (TID) | INTRAMUSCULAR | Status: DC
  Administered 2018-05-13 – 2018-05-25 (×33): 5000 [IU] via SUBCUTANEOUS
  Filled 2018-05-12 (×34): qty 1

## 2018-05-12 MED ORDER — SODIUM CHLORIDE 0.9 % IV SOLN
INTRAVENOUS | Status: AC | PRN
  Administered 2018-05-12: 1000 mL via INTRAVENOUS

## 2018-05-12 MED ORDER — VANCOMYCIN HCL 10 G IV SOLR
1500.0000 mg | Freq: Once | INTRAVENOUS | Status: DC
  Filled 2018-05-12 (×2): qty 1500

## 2018-05-12 MED ORDER — ROCURONIUM BROMIDE 50 MG/5ML IV SOLN
INTRAVENOUS | Status: AC | PRN
  Administered 2018-05-12: 100 mg via INTRAVENOUS

## 2018-05-12 MED ORDER — ETOMIDATE 2 MG/ML IV SOLN
INTRAVENOUS | Status: AC | PRN
  Administered 2018-05-12: 20 mg via INTRAVENOUS

## 2018-05-12 MED ORDER — SODIUM CHLORIDE 0.9 % IV SOLN
2.0000 g | Freq: Once | INTRAVENOUS | Status: AC
  Administered 2018-05-12: 23:00:00 2 g via INTRAVENOUS
  Filled 2018-05-12: qty 2

## 2018-05-12 NOTE — Progress Notes (Signed)
Responded to ED page. PT is in isolation. Nurse stated family was in the area and they were informed of Chaplain availability. Chaplain not needed at this time.  Chaplain Fidel Levy  [phone]

## 2018-05-12 NOTE — ED Notes (Signed)
Daughter updated on pt's status.  Will be outside in front of ED with other family.

## 2018-05-12 NOTE — H&P (Addendum)
..   NAME:  Misty Green, MRN:  [id number], DOB:  12-18-1970, LOS: 0 ADMISSION DATE:  05/12/2018, CONSULTATION DATE:  05/13/2018 REFERRING MD:  ED PROVIDER, CHIEF COMPLAINT:  S/p cardiac arrest   Brief History   48 yr old F w/ PMHx COPD, CHF, chronic back pain from spinal stenoses, diverticulosis s/p colostomy presents s/p cardiac arrest. PCCM consulted for admission  History of present illness   (History obtained from EMR, family and acct of other providers)  48 yr old F w/ PMHx sig for COPD, CHF, spinal stenoses, diverticulosis s/p colostomy  Her baseline is awake alert oriented x 4 fully functional with iADLS per her daughter she is not very compliant with her medications, she last saw her primary doctor almost a yr ago per her husband.  She uses a CPAP at night which she received 3L O2 through but does not require supplemental oxygen during awake and active periods.  Per her husband the patient started feeling ill ( symptoms included: cough, nasal congestion, occasional SOB) on 05/07/2018 her symptoms became worse on Wednesday 4/22  She complained of a headache accompanied by nausea and vomiting unable to tolerate oral intake. On Thursday 4/23 she continued to have the headache despite tylenol but was drinking fluids, and had a 1/2 can soup and some crackers.  The morning of 05/12/18 she complained of hot and cold flashes. She took tylenol throughout the week for these symptoms. [name] does not know how much she took.   When he found her unresponsive at 8 pm he called 911 immediately he states that he cleared the bed and laid her flat shortly after she began to vomit. He was instructed to turn her on her side which he did. He states that he believes it was  5 mins between the time 911 was called and when [facility] arrived.   Downtime unknown: in the EMR it is listed as 13-14 min code but upon further investigation  per husband states that the pt had been "sleeping" all day and he does not have a  definitive time. He found her unresponsive at 8 pm but He last spoke to her at noon and she verbally responded at that time and went back to sleep.  Daughter states that she doesn't live with he mother but spoke to her via telephone throughout the week up until 4/23 in the evening and her mom denied any complaints to her. She states that her mother had a cardiac arrest back when she was pregnant with her brother and was diagnosed with peripartum cardiomyopathy. She is supposed to be on lasix which she doesn't take.  She presents on 4/25 to [facility] s/p cardiac arrest (initial rhythm unknown). King airway was placed by EMS and she was started on Epi gtt. Intubated in the ED received RSI at 2120. COVID negative off of vasopressors. PCCM consulted.  Past Medical History  .Marland Kitchen Active Ambulatory Problems    Diagnosis Date Noted  . Acute respiratory failure ([place]) 03/22/2016  . COPD exacerbation ([place]) 03/22/2016  . Chronic back pain 03/22/2016  . Opioid type dependence, abuse ([place]) 03/22/2016  . Polycythemia 03/22/2016  . Anxiety 03/22/2016  . OSA on CPAP 03/22/2016  . Diverticulitis 12/03/2017  . Closed fracture of distal end of right radius 08/25/2015  . Closed nondisplaced fracture of styloid process of right ulna 08/25/2015  . Mixed anxiety and depressive disorder 12/22/2017  . Obesity 12/22/2017  . Spinal stenosis of lumbar region 04/12/2016  . Tobacco user 12/22/2017  .  Acute diverticulitis 01/06/2018  . Diverticulitis of colon (without mention of hemorrhage)(562.11)    Resolved Ambulatory Problems    Diagnosis Date Noted  . No Resolved Ambulatory Problems   Past Medical History:  Diagnosis Date  . CHF (congestive heart failure) ([place])   . COPD (chronic obstructive pulmonary disease) ([place])   . Current smoker   . Opiate abuse, continuous ([place])      Significant Hospital Events   s/p cardiac arrest Endotracheally intubated on 05/12/2018  Consults:  PCCM>>>>>>>>>>4/24  Neurology>>>>>>>>4/25  Procedures:  Endotracheal intubation in ED>>>>>>4/24 received RSI   Significant Diagnostic Tests:  Sig Labs: AG19 AST 887 ALT 924 T bili 0.9 Alkphos 105 GFR 44 Cr 1.4>>>>>1.2 Ferritin 6898 LDH 2286 CRP 34.6 LA 7.7 PCT 0.68  WBC 11 Hgb 13 Hct 46 MCV 105 plt 294  CTHead w/o contrast pending    Micro Data:  Blood cx collected on 05/12/2018  Antimicrobials:  COVID 19 negative >>>>>>4/24    Interim history/subjective:  On my evaluation no gag fixed pupils dilated ( left 4+ right 3+) Not on sedation and RSI was given 2120 CTH w/o contrast and Consult to Neurology for a short EEG Objective   Blood pressure 125/69, pulse (!) 113, temperature 99.4 F (37.4 C), resp. rate 18, SpO2 100 %.    Vent Mode: PRVC FiO2 (%):  [100 %] 100 % Set Rate:  [18 bmp] 18 bmp Vt Set:  [380 mL] 380 mL PEEP:  [5 cmH20] 5 cmH20 Plateau Pressure:  [36 cmH20] 36 cmH20   Intake/Output Summary (Last 24 hours) at 05/12/2018 2323 Last data filed at 05/12/2018 2151 Gross per 24 hour  Intake -  Output 100 ml  Net -100 ml   There were no vitals filed for this visit.  Examination: General: obese female intubated not on continuous sedation HENT: normocephalic atraumatic ETT ad OGT in place. Increased secretions. Rhythmic saccade noted later w/ some reaction right pupil. Lungs: rhonchi R>L w/ no wheezing Cardiovascular: S1 and S2 appreciated increased rate Abdomen: soft obese abdomen + colostomy bag w/ stool noted infraumbilical midline scar noted on pannus. Extremities: no gross deformities SCDs in place. Extension of toes.Flexion of arms at times. Neuro: no gag fixed pupils left 4+ right 3+ not reactive on initial exam no withdrawal from painful stimuli.  GU: indwelling foley catheter  Assessment & Plan:  1. S/p cardiac arrest: initial rhythm unknown presumed  PEA - not a candidate for TTM while it is documented as a 13-14 min code time there is an unknown amt of down time that is  unaccounted for prior to Fire arriving. LA 7.7 neurological exam is concerning Plan: Post resuscitative care>>Admit to ICU Not on vasopressors at time of evaluation My bedside echo showed EF approx 45-50% w/ no pericardial effusion IVC dilated not varying. Normothermia no TTM Discussed w/ family at length  CODE STATUS: DNR  2. Acute hypoxic and hypercapnic respiratory failure  Endotracheally intubated in ED CXR shows tube in acceptable position and hyperinflated lungs ABG done in ED: 7.11/>97 459  H/o COPD on Advair, Spiriva and Daliresp uses a cpap w/ oxygen at night Peak pressures increased from copious secretions Plateaus ok Not air trapping  Plan: Goal to increase MVE TV 8cc/kg RR increased 20 PEEP 5 FiO2 40% SABA, LAMA & ICS RVP and Resp cx  3. ? CAP COVID negative on 05/12/2018 Received Cefepime in ED PCT 0.68 CXR showed: Hyperinflated lungs with chronic interstitial opacity at the bases. ECHO:  Lungs had some  B lines in anterior views. No obious hepatization of the lung suggestive of a consolidation. Husband reports possible aspiration event before Fire arrived Left shift on CBC Plan: Added Azithromycin IV due to h/o COPD Added Anaerobe coverage in lieu of Aspiration RVP and resp cx Legionella and Strep Ag sent F/u blood cx  4. Acute encephalopathy s/p cardiac arrest Poor neurological exam repeated several hrs after RSI I explained to the family while this sign is concerning It is too soon to know Plan: CTH w/o contrast Consulted Neurology >>> will f/u their recs evaluate poss EEG  5. Septic shock vs Cardiogenic shock S/p cardiac arrest initial rhtyhm unknown Pt has a h/o CHF per daughter and is non compliant w/ Lasix last EF documented 60-65% in 2018 Initially on Epi gtt when coming into ER on my eval off pressors and hemodynamically stable. Bedside ECHO shows her squeeze approx. 40-50% ( may be secondary to myocardial stunning post code) LA 7 EKG showed  sinus tach   QTc 459 Plan: F/u Lactic Acid MAP goal >74mHg If pt becomes hypotensive will do fluid bolus if not responsive may need vasopressors.  If pt requires vasopressor will place A-line  6. Elevated Transaminases AST and ALT markedly elevated (887 924 respectively) H/o taking tylenol through the week for headache amt unknown Alk phos 105 T bili 0.9 Nausea and vomiting  Last gallstone in 2002 was large and [place] Last CTA/P Dec 2019: Liver had a normal contrast enhanced appearance. Gallbladder was surgically absent. No biliary dilatation. Plan: Trend AST/ ALT Check Acetaminophen level Elevation could be secondary to shock  7. Acute Kidney Injury Baseline creat  0.70 on prev labs now 1.4 on presentation May be secondary to prerenal Plan: Indwelling foley catheter MAP goal >65  Fluid challenge  Monitor for UOP Avoid nephrotoxic agents  Best practice:  Diet: NPO Pain/Anxiety/Delirium protocol (if indicated): not requiring VAP protocol (if indicated): yes DVT prophylaxis: hep [place] and SCDs GI prophylaxis: Protonix Glucose control: if BG exceeds 180 mg/dl  Mobility: bedrest Code Status: No compressions No ACLS No defibrillation No ACLS meds Family Communication: Daughter son and Husband are updated regarding patient's clinical status and guarded prognosis. They have stated clearly that she would not want to be sustained by machines. The husband and daughter have states that if she arrests again no compressions no code no ACLS no defib. They are ok with continuing care. They asked me to comment on brain function and I explained that we typically do not make those statements so soon.   Disposition: ICU  Labs   CBC: Recent Labs  Lab 05/12/18 2135 05/12/18 2147  WBC 11.5*  --   NEUTROABS 8.3*  --   HGB 13.3 15.0  HCT 46.5* 44.0  MCV 105.7*  --   PLT 294  --     Basic Metabolic Panel: Recent Labs  Lab 05/12/18 2135 05/12/18 2147  NA 140 134*  K 4.6  4.7  CL 89*  --   CO2 32  --   GLUCOSE 107*  --   BUN 14  --   CREATININE 1.40* 1.20*  CALCIUM 9.4  --    GFR: CrCl cannot be calculated (Unknown ideal weight.). Recent Labs  Lab 05/12/18 2135  PROCALCITON 0.68  WBC 11.5*  LATICACIDVEN 7.7*    Liver Function Tests: Recent Labs  Lab 05/12/18 2135  AST 887*  ALT 924*  ALKPHOS 105  BILITOT 0.9  PROT 7.4  ALBUMIN 3.0*  No results for input(s): LIPASE, AMYLASE in the last 168 hours. No results for input(s): AMMONIA in the last 168 hours.  ABG    Component Value Date/Time   PHART 7.34 (L) 01/08/2018 2243   PCO2ART 68 (HH) 01/08/2018 2243   PO2ART 51 (L) 01/08/2018 2243   HCO3 37.0 (H) 05/12/2018 2147   TCO2 40 (H) 05/12/2018 2147   O2SAT 73.0 05/12/2018 2147     Coagulation Profile: No results for input(s): INR, PROTIME in the last 168 hours.  Cardiac Enzymes: No results for input(s): CKTOTAL, CKMB, CKMBINDEX, TROPONINI in the last 168 hours.  HbA1C: No results found for: HGBA1C  CBG: Recent Labs  Lab 05/12/18 2155  GLUCAP 95    Review of Systems:   Marland KitchenMarland KitchenReview of Systems  Unable to perform ROS: Mental status change     Past Medical History  She,  has a past medical history of COPD (chronic obstructive pulmonary disease) ([place]), Current smoker, and Opiate abuse, continuous ([place]).   Surgical History    Past Surgical History:  Procedure Laterality Date  . ABDOMINAL HYSTERECTOMY    . BACK SURGERY    . cesction    . COLOSTOMY  01/13/2018   Procedure: COLOSTOMY Creation;  Surgeon: Jules Husbands, MD;  Location: [facility] ORS;  Service: General;;  . INCISIONAL HERNIA REPAIR     lower midline laparotomy incision (hysterectomy), repaired with mesh  . LAPAROSCOPIC CHOLECYSTECTOMY  2012  . LAPAROSCOPIC LYSIS OF ADHESIONS  01/13/2018   Procedure: LAPAROSCOPIC LYSIS OF ADHESIONS;  Surgeon: Jules Husbands, MD;  Location: [facility] ORS;  Service: General;;  . LAPAROSCOPIC SIGMOID COLECTOMY  01/13/2018   Procedure:  LAPAROSCOPIC SIGMOID COLECTOMY;  Surgeon: Jules Husbands, MD;  Location: [facility] ORS;  Service: General;;  . ORIF WRIST FRACTURE Right 08/25/2015   Procedure: OPEN REDUCTION INTERNAL FIXATION (ORIF) WRIST FRACTURE;  Surgeon: Corky Mull, MD;  Location: [facility] ORS;  Service: Orthopedics;  Laterality: Right;     Social History   reports that she has quit smoking. Her smoking use included cigarettes. She has a 15.00 pack-year smoking history. She has never used smokeless tobacco. She reports current drug use. Drug: Marijuana. She reports that she does not drink alcohol.   Family History   Her family history includes COPD in her mother; Diabetes in her father; Hypertension in her father.   Allergies Allergies  Allergen Reactions  . Other Nausea And Vomiting    Anti-depressent that starts with t     Home Medications  Prior to Admission medications   Medication Sig Start Date End Date Taking? Authorizing Provider  albuterol (PROVENTIL HFA;VENTOLIN HFA) 108 (90 Base) MCG/ACT inhaler Inhale into the lungs every 6 (six) hours as needed for wheezing or shortness of breath.    [provider]  ALPRAZolam Duanne Moron) 1 MG tablet Take 1 mg by mouth 4 (four) times daily.    [provider]  Ascorbic Acid (VITAMIN C) 1000 MG tablet Take 1,000 mg by mouth daily.    [provider]  cyclobenzaprine (FLEXERIL) 5 MG tablet Take 1 tablet (5 mg total) by mouth 3 (three) times daily. 01/30/18   Pabon, Diego F, MD  docusate sodium (COLACE) 100 MG capsule Take 100 mg by mouth 2 (two) times daily. 01/09/18   [provider]  Fluticasone-Salmeterol (ADVAIR) 250-50 MCG/DOSE AEPB Inhale 1 puff into the lungs 2 (two) times daily.    [provider]  furosemide (LASIX) 40 MG tablet Take 40 mg by mouth daily  as needed for fluid.     [provider]  gabapentin (NEURONTIN) 300 MG capsule Take 800 mg by mouth 3 (three) times daily.     [provider]  metaxalone  (SKELAXIN) 400 MG tablet Take 1 tablet (400 mg total) by mouth every 8 (eight) hours as needed for muscle spasms. 01/18/18   Hillary Bow, MD  Multiple Vitamins-Minerals (MULTIVITAMIN WITH MINERALS) tablet Take 1 tablet by mouth daily.    [provider]  ondansetron (ZOFRAN ODT) 4 MG disintegrating tablet Take 1 tablet (4 mg total) by mouth every 8 (eight) hours as needed for nausea or vomiting. 01/30/18   Pabon, Diego F, MD  ondansetron (ZOFRAN) 8 MG tablet Take 1 tablet (8 mg total) by mouth every 8 (eight) hours as needed for nausea or vomiting. 02/15/18   Pabon, [place], MD  roflumilast (DALIRESP) 500 MCG TABS tablet Take 500 mcg by mouth daily.    [provider]  tiotropium (SPIRIVA) 18 MCG inhalation capsule Place 18 mcg into inhaler and inhale daily.    [provider]  zolpidem (AMBIEN) 10 MG tablet Take 10 mg by mouth at bedtime as needed for sleep.    [provider]   STAFF NOTE  I, Dr Seward Carol have personally reviewed patient's available data, including medical history, events of note, physical examination and test results as part of my evaluation. I have discussed with and other care providers such as neurologist, RN and Elink.  In addition,  I personally evaluated patient  The patient is critically ill with multiple organ systems failure and requires high complexity decision making for assessment and support, frequent evaluation and titration of therapies, application of advanced monitoring technologies and extensive interpretation of multiple databases.   Critical Care Time devoted to patient care services described in this note is  60 Minutes. This time reflects time of care of this signee Dr Seward Carol. This critical care time does not reflect procedure time, or teaching time or supervisory time of NP but could involve care discussion time   CC TIME: 60  minutes DISPOSITION:ICU PROGNOSIS:guarded CODE STATUS: DNR  Dr. Seward Carol Pulmonary Critical Care Medicine  05/13/2018 2:38 AM   Critical care time: 60 mins

## 2018-05-12 NOTE — ED Notes (Signed)
CBG Results of 95 reported to [place], Therapist, sports.

## 2018-05-12 NOTE — ED Provider Notes (Signed)
[facility] EMERGENCY DEPARTMENT Provider Note   CSN: [id number] Arrival date & time: 05/12/18  2135    History   Chief Complaint Chief Complaint  Patient presents with  . Cardiac Arrest    HPI Misty Green is a 48 y.o. female.     HPI Misty Green a47 y.o.femalewith a known history of COPD,, nicotine abuse,Status post colectomy and end colostomy on 01/13/2018 for recurrent sigmoid diverticulitis.  Found down by family today in bed unresponsive without a pulse so they called EMS. EMS EMS performed CPR from approximately 8:08 PM to approximately 8:21 PM.  Non-shockable rhythm based on AED.  Sinus tachycardia on the monitor otherwise.  EMS started an epinephrine drip when the patient started to lose blood pressure while in the ambulance.  Upon speaking to the family, the patient has had URI symptoms including cough, nasal congestion, nausea vomiting for the last 3 days.  No known fevers at home.  Patient has been complaining of shortness of breath and feeling unwell for the last several days.  Was taking a nap today and the husband was checking on her regularly every 30 to 45 minutes and that is when he found her unresponsive in bed. Past Medical History:  Diagnosis Date  . COPD (chronic obstructive pulmonary disease) ([place])   . Current smoker   . Opiate abuse, continuous [facility])     Patient Active Problem List   Diagnosis Date Noted  . Diverticulitis of colon (without mention of hemorrhage)(562.11)   . Acute diverticulitis 01/06/2018  . Mixed anxiety and depressive disorder 12/22/2017  . Obesity 12/22/2017  . Tobacco user 12/22/2017  . Diverticulitis 12/03/2017  . Spinal stenosis of lumbar region 04/12/2016  . Acute respiratory failure ([place]) 03/22/2016  . COPD exacerbation ([place]) 03/22/2016  . Chronic back pain 03/22/2016  . Opioid type dependence, abuse ([place]) 03/22/2016  . Polycythemia 03/22/2016  . Anxiety 03/22/2016  . OSA on CPAP 03/22/2016   . Closed fracture of distal end of right radius 08/25/2015  . Closed nondisplaced fracture of styloid process of right ulna 08/25/2015    Past Surgical History:  Procedure Laterality Date  . ABDOMINAL HYSTERECTOMY    . BACK SURGERY    . cesction    . COLOSTOMY  01/13/2018   Procedure: COLOSTOMY Creation;  Surgeon: Jules Husbands, MD;  Location: [facility] ORS;  Service: General;;  . INCISIONAL HERNIA REPAIR     lower midline laparotomy incision (hysterectomy), repaired with mesh  . LAPAROSCOPIC CHOLECYSTECTOMY  2012  . LAPAROSCOPIC LYSIS OF ADHESIONS  01/13/2018   Procedure: LAPAROSCOPIC LYSIS OF ADHESIONS;  Surgeon: Jules Husbands, MD;  Location: [facility] ORS;  Service: General;;  . LAPAROSCOPIC SIGMOID COLECTOMY  01/13/2018   Procedure: LAPAROSCOPIC SIGMOID COLECTOMY;  Surgeon: Jules Husbands, MD;  Location: [facility] ORS;  Service: General;;  . ORIF WRIST FRACTURE Right 08/25/2015   Procedure: OPEN REDUCTION INTERNAL FIXATION (ORIF) WRIST FRACTURE;  Surgeon: Corky Mull, MD;  Location: [facility] ORS;  Service: Orthopedics;  Laterality: Right;     OB History   No obstetric history on file.      Home Medications    Prior to Admission medications   Medication Sig Start Date End Date Taking? Authorizing Provider  albuterol (PROVENTIL HFA;VENTOLIN HFA) 108 (90 Base) MCG/ACT inhaler Inhale into the lungs every 6 (six) hours as needed for wheezing or shortness of breath.    [provider]  ALPRAZolam Duanne Moron) 1 MG tablet Take 1 mg by mouth 4 (four)  times daily.    [provider]  Ascorbic Acid (VITAMIN C) 1000 MG tablet Take 1,000 mg by mouth daily.    [provider]  cyclobenzaprine (FLEXERIL) 5 MG tablet Take 1 tablet (5 mg total) by mouth 3 (three) times daily. 01/30/18   Pabon, Diego F, MD  docusate sodium (COLACE) 100 MG capsule Take 100 mg by mouth 2 (two) times daily. 01/09/18   [provider]  Fluticasone-Salmeterol (ADVAIR) 250-50 MCG/DOSE AEPB Inhale 1  puff into the lungs 2 (two) times daily.    [provider]  furosemide (LASIX) 40 MG tablet Take 40 mg by mouth daily as needed for fluid.     [provider]  gabapentin (NEURONTIN) 300 MG capsule Take 800 mg by mouth 3 (three) times daily.     [provider]  metaxalone (SKELAXIN) 400 MG tablet Take 1 tablet (400 mg total) by mouth every 8 (eight) hours as needed for muscle spasms. 01/18/18   Hillary Bow, MD  Multiple Vitamins-Minerals (MULTIVITAMIN WITH MINERALS) tablet Take 1 tablet by mouth daily.    [provider]  ondansetron (ZOFRAN ODT) 4 MG disintegrating tablet Take 1 tablet (4 mg total) by mouth every 8 (eight) hours as needed for nausea or vomiting. 01/30/18   Pabon, Diego F, MD  ondansetron (ZOFRAN) 8 MG tablet Take 1 tablet (8 mg total) by mouth every 8 (eight) hours as needed for nausea or vomiting. 02/15/18   Pabon, Emmet, MD  roflumilast (DALIRESP) 500 MCG TABS tablet Take 500 mcg by mouth daily.    [provider]  tiotropium (SPIRIVA) 18 MCG inhalation capsule Place 18 mcg into inhaler and inhale daily.    [provider]  zolpidem (AMBIEN) 10 MG tablet Take 10 mg by mouth at bedtime as needed for sleep.    [provider]    Family History Family History  Problem Relation Age of Onset  . COPD Mother   . Hypertension Father   . Diabetes Father     Social History Social History   Tobacco Use  . Smoking status: Former Smoker    Packs/day: 1.00    Years: 15.00    Pack years: 15.00    Types: Cigarettes  . Smokeless tobacco: Never Used  Substance Use Topics  . Alcohol use: No  . Drug use: Yes    Types: Marijuana    Comment: Yesterday     Allergies   Other   Review of Systems Review of Systems  Unable to perform ROS: Intubated     Physical Exam Updated Vital Signs There were no vitals taken for this visit.  Physical Exam Vitals signs and nursing note reviewed. Exam conducted with a  chaperone present.  Constitutional:      Appearance: She is toxic-appearing.     Comments: King airway in place  HENT:     Head: Normocephalic.     Right Ear: External ear normal.     Left Ear: External ear normal.     Nose: Congestion and rhinorrhea present.     Mouth/Throat:     Pharynx: No oropharyngeal exudate or posterior oropharyngeal erythema.  Eyes:     Comments: Pupils 83m unreactive bilaterally  Neck:     Comments: In cervical collar  Cardiovascular:     Rate and Rhythm: Tachycardia present.     Heart sounds: No murmur.  Pulmonary:     Comments: Diminished breath sounds bilaterally Abdominal:     Comments: Left sided  colostomy in place, C/D/I  Musculoskeletal:     Right lower leg: Edema present.     Left lower leg: Edema present.  Neurological:     Comments: Absent gag, corneal reflexes.  Occasional breaths taken by patient.  GCS 3.  No purposeful movement.      ED Treatments / Results  Labs (all labs ordered are listed, but only abnormal results are displayed) Labs Reviewed - No data to display  EKG None  Radiology No results found.  Procedures .Critical Care Performed by: Andee Poles, MD Authorized by: Andee Poles, MD   Critical care provider statement:    Critical care time (minutes):  45   Critical care was necessary to treat or prevent imminent or life-threatening deterioration of the following conditions:  Circulatory failure, CNS failure or compromise, dehydration, shock, sepsis and respiratory failure   Critical care was time spent personally by me on the following activities:  Blood draw for specimens, development of treatment plan with patient or surrogate, discussions with consultants, evaluation of patient's response to treatment, examination of patient, interpretation of cardiac output measurements, obtaining history from patient or surrogate, ventilator management, review of old charts, re-evaluation of patient's condition,  pulse oximetry, ordering and review of radiographic studies and ordering and review of laboratory studies Procedure Name: Intubation Date/Time: 05/12/2018 10:36 PM Performed by: Andee Poles, MD Pre-anesthesia Checklist: Patient identified Oxygen Delivery Method: Ambu bag Preoxygenation: Pre-oxygenation with 100% oxygen Induction Type: IV induction and Rapid sequence Ventilation: Mask ventilation without difficulty Laryngoscope Size: Mac and 3 Grade View: Grade I Number of attempts: 1 Airway Equipment and Method: Video-laryngoscopy Placement Confirmation: ETT inserted through vocal cords under direct vision Secured at: 25 cm Tube secured with: ETT holder Dental Injury: Teeth and Oropharynx as per pre-operative assessment  Difficulty Due To: Difficulty was unanticipated      (including critical care time)  Medications Ordered in ED Medications - No data to display   Initial Impression / Assessment and Plan / ED Course  I have reviewed the triage vital signs and the nursing notes.  Pertinent labs & imaging results that were available during my care of the patient were reviewed by me and considered in my medical decision making (see chart for details).         Patient presents to the emergency department status post CPR for PEA arrest.  Pupils bilaterally 6 mm and unreactive on arrival without brainstem reflexes except for occasional breathing.  Intubated by myself without complication.  Continues to have normal blood pressure and sinus tachycardia.  Lactic acid 7.7.  Concern for sepsis.  Code sepsis begun.  Fluid bolus given.  Broad-spectrum antibiotics given.  Patient reassessed.  Family updated.  Sepsis - Repeat Assessment  Performed at:    2230  Vitals     Blood pressure 101/75, pulse (!) 110, temperature 99.6 F (37.6 C), temperature source Rectal, resp. rate 15, SpO2 100 %.  Heart:     Regular rate and rhythm  Lungs:    CTA  Capillary Refill:   <2 sec   Peripheral Pulse:    Decreased bilaterally  Skin:     Pale  Intensive care unit called to admit the patient for further management of her respiratory failure and postarrest state.  Otherwise hemodynamically stable under my care.  CT scan ordered at the request of the intensivist.  No recent history of fall so I do not believe a CT cervical spine is needed at this time. Final Clinical Impressions(s) /  ED Diagnoses   Final diagnoses:  Cardiac arrest ([place])  Lactic acidosis  Transaminitis  Acute on chronic respiratory failure with hypercapnia [facility])    ED Discharge Orders    None       Andee Poles, MD 05/12/18 2318    Lajean Saver, MD 05/13/18 2112

## 2018-05-12 NOTE — ED Triage Notes (Signed)
Pt BIB [facility] as a post-CPR. Family reports pt developed flu-like symptoms, body aches started Sunday. Developed a HA and low grade fever Wednesday, and pt started coughing yesterday. Pt's family found her unresponsive tonight, fire started CPR at 2008, [place] at 2021. Initially normotensive, became hypotensive en route, EMS started epi drip. No shocks given. ST on arrival.

## 2018-05-13 ENCOUNTER — Inpatient Hospital Stay (HOSPITAL_COMMUNITY): Payer: 59

## 2018-05-13 DIAGNOSIS — E872 Acidosis, unspecified: Secondary | ICD-10-CM

## 2018-05-13 DIAGNOSIS — G92 Toxic encephalopathy: Secondary | ICD-10-CM

## 2018-05-13 DIAGNOSIS — J9601 Acute respiratory failure with hypoxia: Secondary | ICD-10-CM

## 2018-05-13 DIAGNOSIS — R740 Nonspecific elevation of levels of transaminase and lactic acid dehydrogenase [LDH]: Secondary | ICD-10-CM

## 2018-05-13 DIAGNOSIS — J9622 Acute and chronic respiratory failure with hypercapnia: Secondary | ICD-10-CM

## 2018-05-13 DIAGNOSIS — R7401 Elevation of levels of liver transaminase levels: Secondary | ICD-10-CM

## 2018-05-13 DIAGNOSIS — J9602 Acute respiratory failure with hypercapnia: Secondary | ICD-10-CM

## 2018-05-13 LAB — BLOOD GAS, ARTERIAL
Acid-Base Excess: 7.7 mmol/L — ABNORMAL HIGH (ref 0.0–2.0)
Bicarbonate: 33.6 mmol/L — ABNORMAL HIGH (ref 20.0–28.0)
Drawn by: 274071
FIO2: 50
MECHVT: 410 mL
O2 Saturation: 96.5 %
PEEP: 10 cmH2O
Patient temperature: 100
RATE: 22 resp/min
pCO2 arterial: 69.4 mmHg (ref 32.0–48.0)
pH, Arterial: 7.311 — ABNORMAL LOW (ref 7.350–7.450)
pO2, Arterial: 103 mmHg (ref 83.0–108.0)

## 2018-05-13 LAB — TRIGLYCERIDES: Triglycerides: 124 mg/dL (ref <150)

## 2018-05-13 LAB — RESPIRATORY PANEL BY PCR

## 2018-05-13 LAB — POCT I-STAT 7, (LYTES, BLD GAS, ICA,H+H)
Acid-Base Excess: 11 mmol/L — ABNORMAL HIGH (ref 0.0–2.0)
Acid-Base Excess: 9 mmol/L — ABNORMAL HIGH (ref 0.0–2.0)
Acid-Base Excess: 8 mmol/L — ABNORMAL HIGH (ref 0.0–2.0)
Bicarbonate: 39.7 mmol/L — ABNORMAL HIGH (ref 20.0–28.0)
Bicarbonate: 37.7 mmol/L — ABNORMAL HIGH (ref 20.0–28.0)
Bicarbonate: 37.9 mmol/L — ABNORMAL HIGH (ref 20.0–28.0)
Calcium, Ion: 1.06 mmol/L — ABNORMAL LOW (ref 1.15–1.40)
Calcium, Ion: 1.06 mmol/L — ABNORMAL LOW (ref 1.15–1.40)
Calcium, Ion: 1.11 mmol/L — ABNORMAL LOW (ref 1.15–1.40)
HCT: 41 % (ref 36.0–46.0)
HCT: 38 % (ref 36.0–46.0)
HCT: 38 % (ref 36.0–46.0)
Hemoglobin: 13.9 g/dL (ref 12.0–15.0)
Hemoglobin: 12.9 g/dL (ref 12.0–15.0)
Hemoglobin: 12.9 g/dL (ref 12.0–15.0)
O2 Saturation: 86 %
O2 Saturation: 94 %
O2 Saturation: 90 %
Patient temperature: 38.3
Patient temperature: 100
Patient temperature: 100.2
Potassium: 4.1 mmol/L (ref 3.5–5.1)
Potassium: 3 mmol/L — ABNORMAL LOW (ref 3.5–5.1)
Potassium: 3.4 mmol/L — ABNORMAL LOW (ref 3.5–5.1)
Sodium: 138 mmol/L (ref 135–145)
Sodium: 141 mmol/L (ref 135–145)
Sodium: 140 mmol/L (ref 135–145)
TCO2: 42 mmol/L — ABNORMAL HIGH (ref 22–32)
TCO2: 40 mmol/L — ABNORMAL HIGH (ref 22–32)
TCO2: 40 mmol/L — ABNORMAL HIGH (ref 22–32)
pCO2 arterial: 76 mmHg (ref 32.0–48.0)
pCO2 arterial: 78.1 mmHg (ref 32.0–48.0)
pCO2 arterial: 85 mmHg (ref 32.0–48.0)
pH, Arterial: 7.332 — ABNORMAL LOW (ref 7.350–7.450)
pH, Arterial: 7.296 — ABNORMAL LOW (ref 7.350–7.450)
pH, Arterial: 7.262 — ABNORMAL LOW (ref 7.350–7.450)
pO2, Arterial: 63 mmHg — ABNORMAL LOW (ref 83.0–108.0)
pO2, Arterial: 85 mmHg (ref 83.0–108.0)
pO2, Arterial: 74 mmHg — ABNORMAL LOW (ref 83.0–108.0)

## 2018-05-13 LAB — LACTIC ACID, PLASMA: Lactic Acid, Venous: 3.1 mmol/L (ref 0.5–1.9)

## 2018-05-13 LAB — ACETAMINOPHEN LEVEL: Acetaminophen (Tylenol), Serum: <10 ug/mL — ABNORMAL LOW (ref 10–30)

## 2018-05-13 LAB — MRSA PCR SCREENING: MRSA by PCR: NEGATIVE

## 2018-05-13 MED ORDER — SODIUM CHLORIDE 0.9% FLUSH
10.0000 mL | INTRAVENOUS | Status: DC | PRN
  Administered 2018-05-23 (×2): 10 mL
  Filled 2018-05-13 (×2): qty 40

## 2018-05-13 MED ORDER — BUDESONIDE 0.25 MG/2ML IN SUSP
0.2500 mg | Freq: Two times a day (BID) | RESPIRATORY_TRACT | Status: DC
  Administered 2018-05-13 – 2018-05-14 (×3): 0.25 mg via RESPIRATORY_TRACT
  Filled 2018-05-13 (×3): qty 2

## 2018-05-13 MED ORDER — CHLORHEXIDINE GLUCONATE CLOTH 2 % EX PADS
6.0000 | MEDICATED_PAD | Freq: Every day | CUTANEOUS | Status: DC
  Administered 2018-05-13 – 2018-05-23 (×8): 6 via TOPICAL

## 2018-05-13 MED ORDER — METRONIDAZOLE IN NACL 5-0.79 MG/ML-% IV SOLN
500.0000 mg | Freq: Three times a day (TID) | INTRAVENOUS | Status: DC
  Administered 2018-05-13 – 2018-05-14 (×4): 500 mg via INTRAVENOUS
  Filled 2018-05-13 (×4): qty 100

## 2018-05-13 MED ORDER — PHENYLEPHRINE HCL-NACL 10-0.9 MG/250ML-% IV SOLN
INTRAVENOUS | Status: AC
  Filled 2018-05-13: qty 250

## 2018-05-13 MED ORDER — FENTANYL 2500MCG IN NS 250ML (10MCG/ML) PREMIX INFUSION
0.0000 ug/h | INTRAVENOUS | Status: DC
  Administered 2018-05-13: 16:00:00 25 ug/h via INTRAVENOUS
  Administered 2018-05-14 – 2018-05-15 (×2): 100 ug/h via INTRAVENOUS
  Administered 2018-05-16 (×2): 175 ug/h via INTRAVENOUS
  Administered 2018-05-17: 75 ug/h via INTRAVENOUS
  Filled 2018-05-13 (×6): qty 250

## 2018-05-13 MED ORDER — ALBUMIN HUMAN 5 % IV SOLN
INTRAVENOUS | Status: AC
  Administered 2018-05-13: 02:00:00 12.5 g
  Filled 2018-05-13: qty 250

## 2018-05-13 MED ORDER — SODIUM BICARBONATE 8.4 % IV SOLN
50.0000 meq | Freq: Once | INTRAVENOUS | Status: AC
  Administered 2018-05-13: 01:00:00 50 meq via INTRAVENOUS
  Filled 2018-05-13: qty 50

## 2018-05-13 MED ORDER — PHENYLEPHRINE HCL-NACL 10-0.9 MG/250ML-% IV SOLN
0.0000 ug/min | INTRAVENOUS | Status: DC
  Administered 2018-05-13: 07:00:00 40 ug/min via INTRAVENOUS
  Administered 2018-05-13: 02:00:00 35 ug/min via INTRAVENOUS
  Filled 2018-05-13: qty 250

## 2018-05-13 MED ORDER — MIDAZOLAM HCL 2 MG/2ML IJ SOLN
INTRAMUSCULAR | Status: AC
  Filled 2018-05-13: qty 2

## 2018-05-13 MED ORDER — IPRATROPIUM-ALBUTEROL 0.5-2.5 (3) MG/3ML IN SOLN
3.0000 mL | Freq: Four times a day (QID) | RESPIRATORY_TRACT | Status: DC
  Administered 2018-05-13 (×2): 3 mL via RESPIRATORY_TRACT
  Filled 2018-05-13 (×2): qty 3

## 2018-05-13 MED ORDER — SODIUM CHLORIDE 0.9 % IV SOLN
500.0000 mg | Freq: Once | INTRAVENOUS | Status: AC
  Administered 2018-05-13: 04:00:00 500 mg via INTRAVENOUS
  Filled 2018-05-13 (×2): qty 500

## 2018-05-13 MED ORDER — CISATRACURIUM BESYLATE (PF) 10 MG/5ML IV SOLN
0.1000 mg/kg | INTRAVENOUS | Status: DC | PRN
  Administered 2018-05-13: 16:00:00 8.6 mg via INTRAVENOUS
  Filled 2018-05-13 (×2): qty 4.3

## 2018-05-13 MED ORDER — SODIUM CHLORIDE 0.9 % IV SOLN: INTRAVENOUS | Status: DC | PRN

## 2018-05-13 MED ORDER — SODIUM CHLORIDE 0.9 % IV SOLN
2.0000 g | Freq: Three times a day (TID) | INTRAVENOUS | Status: DC
  Administered 2018-05-13 – 2018-05-14 (×3): 2 g via INTRAVENOUS
  Filled 2018-05-13 (×7): qty 2

## 2018-05-13 MED ORDER — MIDAZOLAM HCL 2 MG/2ML IJ SOLN
2.0000 mg | Freq: Once | INTRAMUSCULAR | Status: AC
  Administered 2018-05-13: 12:00:00 2 mg via INTRAVENOUS

## 2018-05-13 MED ORDER — PROPOFOL 1000 MG/100ML IV EMUL
5.0000 ug/kg/min | INTRAVENOUS | Status: DC
  Administered 2018-05-13: 23:00:00 30 ug/kg/min via INTRAVENOUS
  Administered 2018-05-13: 17:00:00 60 ug/kg/min via INTRAVENOUS
  Administered 2018-05-13: 5 ug/kg/min via INTRAVENOUS
  Administered 2018-05-13: 20:00:00 60 ug/kg/min via INTRAVENOUS
  Administered 2018-05-14 (×2): 30 ug/kg/min via INTRAVENOUS
  Administered 2018-05-14: 12:00:00 50 ug/kg/min via INTRAVENOUS
  Administered 2018-05-14: 06:00:00 5 ug/kg/min via INTRAVENOUS
  Administered 2018-05-15: 04:00:00 45 ug/kg/min via INTRAVENOUS
  Filled 2018-05-13 (×10): qty 100

## 2018-05-13 MED ORDER — LACTATED RINGERS IV BOLUS
1000.0000 mL | Freq: Once | INTRAVENOUS | Status: AC
  Administered 2018-05-13: 02:00:00 1000 mL via INTRAVENOUS

## 2018-05-13 MED ORDER — IPRATROPIUM-ALBUTEROL 0.5-2.5 (3) MG/3ML IN SOLN: 3.0000 mL | RESPIRATORY_TRACT | Status: DC | PRN

## 2018-05-13 MED ORDER — CHLORHEXIDINE GLUCONATE 0.12% ORAL RINSE (MEDLINE KIT)
15.0000 mL | Freq: Two times a day (BID) | OROMUCOSAL | Status: DC
  Administered 2018-05-13 – 2018-05-27 (×22): 15 mL via OROMUCOSAL

## 2018-05-13 MED ORDER — FENTANYL CITRATE (PF) 100 MCG/2ML IJ SOLN
25.0000 ug | INTRAMUSCULAR | Status: DC | PRN
  Administered 2018-05-13: 50 ug via INTRAVENOUS
  Administered 2018-05-13: 12:00:00 100 ug via INTRAVENOUS
  Administered 2018-05-14 – 2018-05-15 (×5): 50 ug via INTRAVENOUS
  Administered 2018-05-15: 75 ug via INTRAVENOUS
  Administered 2018-05-15: 50 ug via INTRAVENOUS
  Administered 2018-05-18 – 2018-05-19 (×3): 75 ug via INTRAVENOUS
  Administered 2018-05-19: 100 ug via INTRAVENOUS
  Administered 2018-05-20: 13:00:00 50 ug via INTRAVENOUS
  Administered 2018-05-21 – 2018-05-23 (×3): 100 ug via INTRAVENOUS
  Administered 2018-05-23: 75 ug via INTRAVENOUS
  Administered 2018-05-23 – 2018-05-25 (×2): 25 ug via INTRAVENOUS
  Filled 2018-05-13 (×15): qty 2

## 2018-05-13 MED ORDER — ORAL CARE MOUTH RINSE
15.0000 mL | OROMUCOSAL | Status: DC
  Administered 2018-05-13 – 2018-05-18 (×53): 15 mL via OROMUCOSAL

## 2018-05-13 MED ORDER — MAGNESIUM SULFATE 2 GM/50ML IV SOLN
2.0000 g | Freq: Once | INTRAVENOUS | Status: AC
  Administered 2018-05-13: 14:00:00 2 g via INTRAVENOUS
  Filled 2018-05-13: qty 50

## 2018-05-13 MED ORDER — METHYLPREDNISOLONE SODIUM SUCC 125 MG IJ SOLR
60.0000 mg | Freq: Four times a day (QID) | INTRAMUSCULAR | Status: DC
  Administered 2018-05-13 – 2018-05-14 (×4): 60 mg via INTRAVENOUS
  Filled 2018-05-13 (×4): qty 2

## 2018-05-13 MED ORDER — ALBUMIN HUMAN 25 % IV SOLN: 12.5000 g | Freq: Once | INTRAVENOUS | Status: DC

## 2018-05-13 NOTE — H&P (Addendum)
..   NAME:  Misty Green, MRN:  [id number], DOB:  04-11-1970, LOS: 1 ADMISSION DATE:  05/12/2018, CONSULTATION DATE:  05/13/2018 REFERRING MD:  ED PROVIDER, CHIEF COMPLAINT:  S/p cardiac arrest   Brief History   48 yr old F w/ PMHx COPD, CHF, chronic back pain from spinal stenoses, diverticulosis s/p colostomy presents s/p cardiac arrest. PCCM consulted for admission  History of present illness   (History obtained from EMR, family and acct of other providers)  48 yr old F w/ PMHx sig for COPD, CHF, spinal stenoses, diverticulosis s/p colostomy  Her baseline is awake alert oriented x 4 fully functional with iADLS per her daughter she is not very compliant with her medications, she last saw her primary doctor almost a yr ago per her husband.  She uses a CPAP at night which she received 3L O2 through but does not require supplemental oxygen during awake and active periods.  Per her husband the patient started feeling ill ( symptoms included: cough, nasal congestion, occasional SOB) on 05/07/2018 her symptoms became worse on Wednesday 4/22  She complained of a headache accompanied by nausea and vomiting unable to tolerate oral intake. On Thursday 4/23 she continued to have the headache despite tylenol but was drinking fluids, and had a 1/2 can soup and some crackers.  The morning of 05/12/18 she complained of hot and cold flashes. She took tylenol throughout the week for these symptoms. [name] does not know how much she took.   When he found her unresponsive at 8 pm he called 911 immediately he states that he cleared the bed and laid her flat shortly after she began to vomit. He was instructed to turn her on her side which he did. He states that he believes it was  5 mins between the time 911 was called and when Fire arrived.   Downtime unknown: in the EMR it is listed as 13-14 min code but upon further investigation  per husband states that the pt had been "sleeping" all day and he does not have a  definitive time. He found her unresponsive at 8 pm but He last spoke to her at noon and she verbally responded at that time and went back to sleep.  Daughter states that she doesn't live with he mother but spoke to her via telephone throughout the week up until 4/23 in the evening and her mom denied any complaints to her. She states that her mother had a cardiac arrest back when she was pregnant with her brother and was diagnosed with peripartum cardiomyopathy. She is supposed to be on lasix which she doesn't take.  She presents on 4/[facility] to [facility] s/p cardiac arrest (initial rhythm unknown). King airway was placed by EMS and she was started on Epi gtt. Intubated in the ED received RSI at 2120. COVID negative off of vasopressors. PCCM consulted.  Past Medical History  .Marland Kitchen Active Ambulatory Problems    Diagnosis Date Noted  . Acute respiratory failure ([place]) 03/22/2016  . COPD exacerbation ([place]) 03/22/2016  . Chronic back pain 03/22/2016  . Opioid type dependence, abuse ([place]) 03/22/2016  . Polycythemia 03/22/2016  . Anxiety 03/22/2016  . OSA on CPAP 03/22/2016  . Diverticulitis 12/03/2017  . Closed fracture of distal end of right radius 08/25/2015  . Closed nondisplaced fracture of styloid process of right ulna 08/25/2015  . Mixed anxiety and depressive disorder 12/22/2017  . Obesity 12/22/2017  . Spinal stenosis of lumbar region 04/12/2016  . Tobacco user 12/22/2017  .  Acute diverticulitis 01/06/2018  . Diverticulitis of colon (without mention of hemorrhage)(562.11)    Resolved Ambulatory Problems    Diagnosis Date Noted  . No Resolved Ambulatory Problems   Past Medical History:  Diagnosis Date  . CHF (congestive heart failure) ([place])   . COPD (chronic obstructive pulmonary disease) ([place])   . Current smoker   . Opiate abuse, continuous ([place])      Significant Hospital Events   s/p cardiac arrest Endotracheally intubated on 05/12/2018  Consults:  PCCM>>>>>>>>>>4/24  Neurology>>>>>>>>4/25  Procedures:  Endotracheal intubation in ED>>>>>>4/24 received RSI   Significant Diagnostic Tests:  Sig Labs: AG19 AST 887 ALT 924 T bili 0.9 Alkphos 105 GFR 44 Cr 1.4>>>>>1.2 Ferritin 6898 LDH 2286 CRP 34.6 LA 7.7 PCT 0.68  WBC 11 Hgb 13 Hct 46 MCV 105 plt 294  CTHead w/o contrast no acute abnormalities   Micro Data:  Blood cx collected on 05/12/2018  Antimicrobials:  COVID 19 negative >>>>>>4/24    Interim history/subjective:  On my evaluation no gag fixed pupils dilated ( left 4+ right 3+) Not on sedation and RSI was given 2120 CTH w/o contrast and Consult to Neurology for a short EEG Objective   Blood pressure 137/78, pulse (!) 107, temperature (!) 100.4 F (38 C), resp. rate (!) 29, height 5' 1"  (1.549 m), weight 85.6 kg, SpO2 93 %.    Vent Mode: PRVC FiO2 (%):  [40 %-100 %] 50 % Set Rate:  [18 bmp-28 bmp] 22 bmp Vt Set:  [380 mL-500 mL] 410 mL PEEP:  [5 cmH20-10 cmH20] 10 cmH20 Plateau Pressure:  [25 KPV37-48 cmH20] 25 cmH20   Intake/Output Summary (Last 24 hours) at 05/13/2018 1519 Last data filed at 05/13/2018 1400 Gross per 24 hour  Intake 1985.98 ml  Output 135 ml  Net 1850.98 ml   Filed Weights   05/13/18 0256  Weight: 85.6 kg    Examination: General: Middle-aged female currently on full mechanical dilatory support. HEENT: No JVD or lymphadenopathy is appreciated, pupils are unequal but reactive Neuro: Moves left side greater than right side and not CV: Heart sounds are regular PULM: even/non-labored, lungs bilaterally decreased in the bases OL:MBEM, non-tender, bsx4 active  Extremities: warm/dry, 1+ edema  Skin: no rashes or lesions   Assessment & Plan:  1. S/p cardiac arrest: initial rhythm unknown presumed  PEA - not a candidate for TTM while it is documented as a 13-14 min code time there is an unknown amt of down time that is unaccounted for prior to Fire arriving. LA 7.7 neurological exam is concerning Plan: Post  resuscitative care>>Admit to ICU Currently requiring vasopressors Normothermia DNR per family request   2. Acute hypoxic and hypercapnic respiratory failure  Endotracheally intubated in ED CXR shows tube in acceptable position and hyperinflated lungs ABG done in ED: 7.11/>97 459  H/o COPD on Advair, Spiriva and Daliresp uses a cpap w/ oxygen at night Peak pressures increased from copious secretions Plateaus ok Positive air trapping  Plan:  Vent changes made decrease tidal volume.  Decrease respiratory rate.  Increase bronchodilators Start Solu-Medrol 60 mg IV every 6 Crease bronchodilators to every 4 Magnesium sulfate IV Will use propofol for sedation vent tolerance and also with her bronchodilators Add nmb   3. ? CAP COVID negative on 05/12/2018 Received Cefepime in ED PCT 0.68 CXR showed: Hyperinflated lungs with chronic interstitial opacity at the bases. ECHO:  Lungs had some B lines in anterior views. No obious hepatization of the lung suggestive of a consolidation.  Husband reports possible aspiration event before Fire arrived Left shift on CBC Plan: Continue antimicrobial coverage Note coronavirus 19 was negative Day 1 of  Maxipime and Flagyl    4. Acute encephalopathy s/p cardiac arrest Poor neurological exam repeated several hrs after RSI I explained to the family while this sign is concerning It is too soon to know Plan: Head CT is negative Appreciate neurology's input  5. Septic shock vs Cardiogenic shock S/p cardiac arrest initial rhtyhm unknown Pt has a h/o CHF per daughter and is non compliant w/ Lasix last EF documented 60-65% in 2018 Initially on Epi gtt when coming into ER on my eval off pressors and hemodynamically stable. Bedside ECHO shows her squeeze approx. 40-50% ( may be secondary to myocardial stunning post code) LA 7 EKG showed sinus tach   QTc 459 Plan: Monitor lactic acid note is gone from 7.7-3.1 Maintain adequate blood pressure Not  requiring vasopressors at this time Procalcitonin is noted to be 0.68  6. Elevated Transaminases AST and ALT markedly elevated (887 924 respectively) H/o taking tylenol through the week for headache amt unknown Alk phos 105 T bili 0.9 Nausea and vomiting  Last gallstone in 2002 was large and seen on [place] In her Last CTA/P Dec 2019: Liver had a normal contrast enhanced appearance. Gallbladder was surgically absent. No biliary dilatation. Plan: Monitor liver function   7. Acute Kidney Injury Lab Results  Component Value Date   CREATININE 1.20 (H) 05/12/2018   CREATININE 1.40 (H) 05/12/2018   CREATININE 0.70 01/17/2018   Baseline creat  0.70 on prev labs now 1.4 on presentation May be secondary to prerenal Plan: Foley catheter is been placed Maintain adequate blood pressure IV fluid challenge Nephrotoxins avoid    Best practice:  Diet: NPO Pain/Anxiety/Delirium protocol (if indicated): not requiring VAP protocol (if indicated): yes DVT prophylaxis: hep [place] and SCDs GI prophylaxis: Protonix Glucose control: if BG exceeds 180 mg/dl  Mobility: bedrest Code Status: No compressions No ACLS No defibrillation No ACLS meds Family Communication: Daughter son and Husband are updated regarding patient's clinical status and guarded prognosis. They have stated clearly that she would not want to be sustained by machines. The husband and daughter have states that if she arrests again no compressions no code no ACLS no defib. They are ok with continuing care. They asked me to comment on brain function and I explained that we typically do not make those statements so soon.  05/13/2018 withdraws to noxious stimuli but no purposeful movement. Disposition: ICU  Labs   CBC: Recent Labs  Lab 05/12/18 2135 05/12/18 2147 05/13/18 0147 05/13/18 1107 05/13/18 1454  WBC 11.5*  --   --   --   --   NEUTROABS 8.3*  --   --   --   --   HGB 13.3 15.0 13.9 12.9 12.9  HCT 46.5* 44.0 41.0 38.0 38.0   MCV 105.7*  --   --   --   --   PLT 294  --   --   --   --     Basic Metabolic Panel: Recent Labs  Lab 05/12/18 2135 05/12/18 2147 05/13/18 0147 05/13/18 1107 05/13/18 1454  NA 140 134* 138 141 140  K 4.6 4.7 4.1 3.0* 3.4*  CL 89*  --   --   --   --   CO2 32  --   --   --   --   GLUCOSE 107*  --   --   --   --  BUN 14  --   --   --   --   CREATININE 1.40* 1.20*  --   --   --   CALCIUM 9.4  --   --   --   --    GFR: Estimated Creatinine Clearance: 56.9 mL/min (A) (by C-G formula based on SCr of 1.2 mg/dL (H)). Recent Labs  Lab 05/12/18 2135 05/13/18 0034  PROCALCITON 0.68  --   WBC 11.5*  --   LATICACIDVEN 7.7* 3.1*    Liver Function Tests: Recent Labs  Lab 05/12/18 2135  AST 887*  ALT 924*  ALKPHOS 105  BILITOT 0.9  PROT 7.4  ALBUMIN 3.0*   No results for input(s): LIPASE, AMYLASE in the last 168 hours. No results for input(s): AMMONIA in the last 168 hours.  ABG    Component Value Date/Time   PHART 7.262 (L) 05/13/2018 1454   PCO2ART 85.0 (HH) 05/13/2018 1454   PO2ART 74.0 (L) 05/13/2018 1454   HCO3 37.9 (H) 05/13/2018 1454   TCO2 40 (H) 05/13/2018 1454   O2SAT 90.0 05/13/2018 1454     Coagulation Profile: No results for input(s): INR, PROTIME in the last 168 hours.  Cardiac Enzymes: No results for input(s): CKTOTAL, CKMB, CKMBINDEX, TROPONINI in the last 168 hours.  HbA1C: No results found for: HGBA1C  CBG: Recent Labs  Lab 05/12/18 2155  GLUCAP 95        App Critical care time: 40 mins    Richardson Landry  ACNP Maryanna Shape PCCM Pager [phone] till 1 pm If no answer page [phone] 05/13/2018, 3:19 PM

## 2018-05-13 NOTE — Progress Notes (Signed)
EEG complete - results pending 

## 2018-05-13 NOTE — Progress Notes (Signed)
Initial Nutrition Assessment   RD working remotely.   DOCUMENTATION CODES:   Not applicable  INTERVENTION:   Recommend initiation of TF within 24-48 hours if pt remains intubated  Tube Feeding:  Vital High Protein at 50 ml/hr Provide 1200 kcals, 106 g of protein and 972 mL of free water Meets 100% calorie and protein needs   NUTRITION DIAGNOSIS:   Inadequate oral intake related to acute illness as evidenced by NPO status.  GOAL:   Patient will meet greater than or equal to 90% of their needs  MONITOR:   Vent status, Labs, Weight trends  REASON FOR ASSESSMENT:   Ventilator    ASSESSMENT:    48 yo female admitted post cardiac arrest. PMH includes COPD, CHF, diverticulosis s/p colostomy   Patient is currently intubated on ventilator support, phenylephrine  MV: 9.5 L/min Temp (24hrs), Avg:100.3 F (37.9 C), Min:99 F (37.2 C), Max:101.1 F (38.4 C)  No recent weight loss per weight encounters; current wt 85.6 kg. Unsure if this dry wt  Unable to obtain diet and weight history at this time  Labs: reviewed Meds: reviewed  NUTRITION - FOCUSED PHYSICAL EXAM:  Unable to assess, working remotely  Diet Order:   Diet Order            Diet NPO time specified  Diet effective now              EDUCATION NEEDS:   Not appropriate for education at this time  Skin:  Skin Assessment: Reviewed RN Assessment  Last BM:  4/25 via colostomy  Height:   Ht Readings from Last 1 Encounters:  05/12/18 5\' 1"  (1.549 m)    Weight:   Wt Readings from Last 1 Encounters:  05/13/18 85.6 kg    Ideal Body Weight:  47.7 kg  BMI:  Body mass index is 35.66 kg/m.  Estimated Nutritional Needs:   Kcal:  1000-1200 kcals   Protein:  96-110 g   Fluid:  >/= 1.5 L   Kerman Passey MS, RD, LDN, CNSC [phone] Pager  [phone] Weekend/On-Call Pager

## 2018-05-13 NOTE — Progress Notes (Signed)
RT NOTE: RT reported critical value to Judson Roch, Therapist, sports. ABG as follows: pH 7.26, CO2 85, PO2 74, and HCO3 37.9. No orders from MD at this time, waiting for MD to see patient. RT will continue to monitor.

## 2018-05-13 NOTE — Progress Notes (Addendum)
Attempted A-Line x2 unsuccessfully and CCMD aware.

## 2018-05-13 NOTE — Consult Note (Signed)
NEURO HOSPITALIST CONSULT NOTE   Requestig physician: Dr. Gilford Raid  Reason for Consult: Anoxic brain injury  History obtained from:  Chart and Communication with ICU Team  HPI:                                                                                                                                          Misty Green is an 48 y.o. female with COPD and nicotine abuse presenting with out of hospital cardiac arrest. She was found unresponsive in her bed with no pulse; they started CPR at 2008 and called EMS. On arrival EMS noted sinus tachycardia on the monitor with no shockable rhythm based on AED. EMS performed CPR for approximately 13 additional minutes prior to [place], which occurred at 2021. Epinephrine gtt was started by EMS for alpha and beta agonist effect after BP started to drop while in the ambulance. Family had noted that the patient was having cough, nasal congestion, nausea and vomiting for th past 3 days, but no know fevers. She had been complaining of SOB for the past several days. Husband had been checking on the patient regularly every 30-45 minutes, so total downtime prior to CPR is within a wide potential time window.   SARS-Cov-2 test came back negative.   WBC 11.5.  Fibrinogen > 800 D-Dimer 3.34 LDH 2286 AST 887 ALT 924  Past Medical History:  Diagnosis Date  . CHF (congestive heart failure) ([place])   . COPD (chronic obstructive pulmonary disease) ([place])   . Current smoker   . Opiate abuse, continuous ([place])     Past Surgical History:  Procedure Laterality Date  . ABDOMINAL HYSTERECTOMY    . BACK SURGERY    . cesction    . COLOSTOMY  01/13/2018   Procedure: COLOSTOMY Creation;  Surgeon: Jules Husbands, MD;  Location: [facility] ORS;  Service: General;;  . INCISIONAL HERNIA REPAIR     lower midline laparotomy incision (hysterectomy), repaired with mesh  . LAPAROSCOPIC CHOLECYSTECTOMY  2012  . LAPAROSCOPIC LYSIS OF ADHESIONS  01/13/2018    Procedure: LAPAROSCOPIC LYSIS OF ADHESIONS;  Surgeon: Jules Husbands, MD;  Location: [facility] ORS;  Service: General;;  . LAPAROSCOPIC SIGMOID COLECTOMY  01/13/2018   Procedure: LAPAROSCOPIC SIGMOID COLECTOMY;  Surgeon: Jules Husbands, MD;  Location: [facility] ORS;  Service: General;;  . ORIF WRIST FRACTURE Right 08/25/2015   Procedure: OPEN REDUCTION INTERNAL FIXATION (ORIF) WRIST FRACTURE;  Surgeon: Corky Mull, MD;  Location: [facility] ORS;  Service: Orthopedics;  Laterality: Right;    Family History  Problem Relation Age of Onset  . COPD Mother   . Hypertension Father   . Diabetes Father               Social History:  reports that she  has quit smoking. Her smoking use included cigarettes. She has a 15.00 pack-year smoking history. She has never used smokeless tobacco. She reports current drug use. Drug: Marijuana. She reports that she does not drink alcohol.  Allergies  Allergen Reactions  . Other Nausea And Vomiting    Anti-depressent that starts with t    HOME MEDICATIONS:                                                                                                                        ROS:                                                                                                                                       Unable to obtain due to coma.    Blood pressure (!) 65/23, pulse (!) 107, temperature (!) 101.1 F (38.4 C), resp. rate (!) 28, height 5\' 1"  (1.549 m), SpO2 96 %.   General Examination:                                                                                                       Physical Exam  HEENT-  [place]/AT   Lungs- Intubated on ventilator. Not overbreathing the vent.  Extremities- No edema. Cool hands and feet.   Neurological Examination Mental Status: GCS E1-Vt1-M3. Of note, the flexion occurred only in her LUE with approximately 3 cm of movement, once during the exam  Cranial Nerves: II: Pupils 7 mm sluggishly reactive to 4 mm bilaterally, round  and symmetric. No blink to threat.   III,IV, VI: Eyes conjugately near the midline with roving saccadic eye movements; the saccades are about 1 mm in amplitude and occur in the direction of overall eye movement. Can cross the midline to left and right. No nystagmus.  V,VII: Face flaccidly symmetric.  VIII: no reaction to voice IX,X: Intubated. No gag reflex, no cough reflex.  XI: Head is midline.  XII: Intubated Motor: Increased extensor tone to bilateral  upper extremities is intermittent and moderate when it occurs. Increased extensor tone in BLE is severe and sustained. Downward pointing toes also noted.  Sensory: No movement to sustained sternal rub and pinch in upper extremities. No withdrawal to noxious plantar stimulation bilaterally.  Deep Tendon Reflexes: 1+ bilateral upper extremities, 3+ bilateral patellae. Toes downgoing.  Cerebellar/Gait: Unable to assess.    Lab Results: Basic Metabolic Panel: Recent Labs  Lab 05/12/18 2135 05/12/18 2147 05/13/18 0147  NA 140 134* 138  K 4.6 4.7 4.1  CL 89*  --   --   CO2 32  --   --   GLUCOSE 107*  --   --   BUN 14  --   --   CREATININE 1.40* 1.20*  --   CALCIUM 9.4  --   --     CBC: Recent Labs  Lab 05/12/18 2135 05/12/18 2147 05/13/18 0147  WBC 11.5*  --   --   NEUTROABS 8.3*  --   --   HGB 13.3 15.0 13.9  HCT 46.5* 44.0 41.0  MCV 105.7*  --   --   PLT 294  --   --     Cardiac Enzymes: No results for input(s): CKTOTAL, CKMB, CKMBINDEX, TROPONINI in the last 168 hours.  Lipid Panel: Recent Labs  Lab 05/12/18 2135  TRIG 74    Imaging: Ct Head Wo Contrast  Result Date: 05/13/2018 CLINICAL DATA:  Altered level of consciousness.  Post CPR. EXAM: CT HEAD WITHOUT CONTRAST TECHNIQUE: Contiguous axial images were obtained from the base of the skull through the vertex without intravenous contrast. COMPARISON:  None. FINDINGS: Brain: No acute intracranial abnormality. Specifically, no hemorrhage, hydrocephalus, mass  lesion, acute infarction, or significant intracranial injury. Vascular: No hyperdense vessel or unexpected calcification. Skull: No acute calvarial abnormality. Sinuses/Orbits: Mucosal thickening throughout the ethmoid air cells. Air-fluid level in the right maxillary sinus. Mastoid air cells clear. Other: None IMPRESSION: No intracranial abnormality. Electronically Signed   By: Rolm Baptise M.D.   On: 05/13/2018 01:08   Dg Chest Port 1 View  Result Date: 05/12/2018 CLINICAL DATA:  Respiratory failure EXAM: PORTABLE CHEST 1 VIEW COMPARISON:  01/08/2018, 03/21/2016 FINDINGS: Endotracheal tube tip is about 29 mm superior to the carina. Esophageal tube tip below the diaphragm but is non included. Borderline cardiomegaly. Chronic interstitial opacity at the bases. No acute consolidation. Aortic atherosclerosis. No pneumothorax. Calcified nodules in the bilateral lung IMPRESSION: 1. Endotracheal tube tip about 29 mm superior to carina. Esophageal tube tip below the diaphragm but incompletely included 2. Hyperinflated lungs with chronic interstitial opacity at the bases. No acute airspace disease Electronically Signed   By: Donavan Foil M.D.   On: 05/12/2018 22:29    Assessment: 48 year old female status post resuscitation following cardiac arrest 1. Exam findings, history and lab findings most consistent with diffuse anoxic brain injury.  2. CT head with subjectively observed increased difference in the attenuation of the WM and GM relative to what would generally be perceived as a less prominent difference in attenuation on a normal scan. The difference is subtle but in my experience can be seen in a subset of anoxic brain injury patients. Dr. Gilford Raid and I both noticed this on independent review of the CT, blinded to each other's opinion. However, official Radiology interpretation documents the scan as abnormal. May need an MRI scan to further evaluate.  3. Her exam has improved slightly since admission,  which militates in favor of continued ICU care  to assess for possible further improvement.   Recommendations: 1. EEG in the AM to assess for possible low voltage tracings, which can be seen in anoxic brain injury with diffuse cortical damage.  2. Consider obtaining an MRI of the brain.  3. Repeat neurological exams in 24 and 72 hours following anoxic brain injury to assess for prognosis.    40 minutes spent in the neurological evaluation and management of this critically ill patient. Time spent included CT review and coordination of care.   Electronically signed: Dr. Kerney Elbe 05/13/2018, 2:22 AM

## 2018-05-13 NOTE — Progress Notes (Signed)
Assisted tele visit to patient with husband.  Lenox Ahr, RN

## 2018-05-13 NOTE — Progress Notes (Signed)
CRITICAL VALUE ALERT  Critical Value:  ABG  Date & Time Notied:  05/13/18 1115  Provider Notified: Richardson Landry Minor, NP   Orders Received/Actions taken: Increased respiratory rate on ventilator to 28. Repeat ABG in 1 hour.    Alma Friendly, RN

## 2018-05-13 NOTE — ED Notes (Signed)
[facility] consulted per CCM: referral # [id number], spoke with Johnney Ou.

## 2018-05-13 NOTE — Progress Notes (Signed)
RT NOTE: RN called RT to make RT aware that MD made vent changes. [place], MD changed tidal volume to 410, Peep to 10 and RR to 22. RT will hold ABG for one hour due to new vent changes. RT will continue to monitor.

## 2018-05-13 NOTE — Progress Notes (Signed)
Video chat with daughter, Aniceto Boss, completed. Answered questions as to whether EEG, MRI was done and if she is on antibiotics

## 2018-05-13 NOTE — Progress Notes (Signed)
CRITICAL VALUE ALERT  Critical Value:  ABG-  CO2 85     Date & Time Notied:  05/13/18 1458  Provider Notified: Kipp Brood, MD   Orders Received/Actions taken: Richardson Landry Minor, NP came to bedside, sedation increased, prn paralytic added. Repeat ABG 1h post administration of paralytic.    Alma Friendly, RN

## 2018-05-13 NOTE — Progress Notes (Signed)
RT NOTE: RT obtained ABG and reported critical to Belenda Cruise, RN @1110 . ABG as follows: pH 7.29, CO2 78.1, PO2 85, HCO3 37.7. RT waiting for orders. RT will continue to monitor.

## 2018-05-13 NOTE — Progress Notes (Signed)
EEG results: Discontinuous background.  No seizures.  Do not see a need for LTM.  -Continues to be difficult to ventilate. PCCM team considering NM blockade to facilitate ventilation. She was spontaneously moving left arm when I evaluated her briefly. Has intact brainstem reflexes.  Will need MRI, possibl tomorrow - would avoid doing it sooner than 48h from event as sometimes anoxic/hypoxic changes are missed if MR is done too soon.  Will follow   -- Amie Portland, MD [facility] Pager: [phone] If 7pm to 7am, please call on call as listed on AMION.

## 2018-05-13 NOTE — Progress Notes (Signed)
CRITICAL VALUE ALERT  Critical Value: lactic acid 3.1  Date & Time Notied: 05/13/2018 0156  Provider Notified: Dr. Gilford Raid  Orders Received/Actions taken: see new orders

## 2018-05-13 NOTE — Progress Notes (Signed)
Assisted tele visit to patient with son.  Topher Buenaventura McEachran, RN  

## 2018-05-13 NOTE — Progress Notes (Signed)
Patient's bladder temp 100.2F. Placed ice packs in attempt to maintain normothermia.   Alma Friendly, RN

## 2018-05-13 NOTE — Procedures (Signed)
ELECTROENCEPHALOGRAM REPORT   Patient: Misty Green       Room #: 1E75T  Age: 48 y.o.        Sex: female Referring Physician: [place] Report Date:  05/13/2018        Interpreting Physician: Alexis Goodell  History: ELYCIA WOODSIDE is an 48 y.o. female s/p arrest  Medications:  Maxipime, Solumedrol, Flagyl, Fentanyl, Neosynephrine, Diprovan  Conditions of Recording:  This is a 21 channel routine scalp EEG performed with bipolar and monopolar montages arranged in accordance to the international 10/20 system of electrode placement. One channel was dedicated to EKG recording.  The patient is in the intubated and sedated state.  Description:  The background activity is marred by sweat and lead artifact.  When the background is able to be evaluated it consists of a low voltage, polymorphic delta activity that is diffusely distributed.  There is some discontinuous nature to this slow activity with periods of synchronous, relative attenuation that are of short duration (up to 2 seconds).   No discrete epileptiform activity is noted.   Hyperventilation and intermittent photic stimulation were not performed.  IMPRESSION: This is an abnormal electroencephalogram due to discontinuous general background slowing, consistent with a medication effect.     Alexis Goodell, MD Neurology [phone] 05/13/2018, 3:36 PM

## 2018-05-13 NOTE — Progress Notes (Signed)
CRITICAL VALUE ALERT  Critical Value:  ABG CO2 69.4  Date & Time Notied:  05/13/18 1830   Provider Notified: Kipp Brood   Orders Received/Actions taken: Continue current treatment. No new orders at this time.    Alma Friendly

## 2018-05-14 ENCOUNTER — Inpatient Hospital Stay (HOSPITAL_COMMUNITY): Payer: 59

## 2018-05-14 DIAGNOSIS — J449 Chronic obstructive pulmonary disease, unspecified: Secondary | ICD-10-CM

## 2018-05-14 DIAGNOSIS — G931 Anoxic brain damage, not elsewhere classified: Secondary | ICD-10-CM

## 2018-05-14 DIAGNOSIS — N179 Acute kidney failure, unspecified: Secondary | ICD-10-CM

## 2018-05-14 DIAGNOSIS — Z9911 Dependence on respirator [ventilator] status: Secondary | ICD-10-CM

## 2018-05-14 LAB — POCT I-STAT 7, (LYTES, BLD GAS, ICA,H+H)
Acid-Base Excess: 7 mmol/L — ABNORMAL HIGH (ref 0.0–2.0)
Bicarbonate: 33.9 mmol/L — ABNORMAL HIGH (ref 20.0–28.0)
Calcium, Ion: 1.11 mmol/L — ABNORMAL LOW (ref 1.15–1.40)
HCT: 32 % — ABNORMAL LOW (ref 36.0–46.0)
Hemoglobin: 10.9 g/dL — ABNORMAL LOW (ref 12.0–15.0)
O2 Saturation: 95 %
Potassium: 3.8 mmol/L (ref 3.5–5.1)
Sodium: 136 mmol/L (ref 135–145)
TCO2: 36 mmol/L — ABNORMAL HIGH (ref 22–32)
pCO2 arterial: 58.2 mmHg — ABNORMAL HIGH (ref 32.0–48.0)
pH, Arterial: 7.373 (ref 7.350–7.450)
pO2, Arterial: 78 mmHg — ABNORMAL LOW (ref 83.0–108.0)

## 2018-05-14 LAB — CBC WITH DIFFERENTIAL/PLATELET
Abs Immature Granulocytes: 0 10*3/uL (ref 0.00–0.07)
Basophils Absolute: 0 10*3/uL (ref 0.0–0.1)
Basophils Relative: 0 %
Eosinophils Absolute: 0 10*3/uL (ref 0.0–0.5)
Eosinophils Relative: 0 %
HCT: 37.8 % (ref 36.0–46.0)
Hemoglobin: 11.5 g/dL — ABNORMAL LOW (ref 12.0–15.0)
Lymphocytes Relative: 7 %
Lymphs Abs: 0.9 10*3/uL (ref 0.7–4.0)
MCH: 29.6 pg (ref 26.0–34.0)
MCHC: 30.4 g/dL (ref 30.0–36.0)
MCV: 97.4 fL (ref 80.0–100.0)
Monocytes Absolute: 0.5 10*3/uL (ref 0.1–1.0)
Monocytes Relative: 4 %
Neutro Abs: 11.3 10*3/uL — ABNORMAL HIGH (ref 1.7–7.7)
Neutrophils Relative %: 89 %
Platelets: 214 10*3/uL (ref 150–400)
RBC: 3.88 MIL/uL (ref 3.87–5.11)
RDW: 13.2 % (ref 11.5–15.5)
WBC: 12.8 10*3/uL — ABNORMAL HIGH (ref 4.0–10.5)
nRBC: 1.1 % — ABNORMAL HIGH (ref 0.0–0.2)
nRBC: 2 /100 WBC — ABNORMAL HIGH

## 2018-05-14 LAB — BASIC METABOLIC PANEL
Anion gap: 15 (ref 5–15)
BUN: 51 mg/dL — ABNORMAL HIGH (ref 6–20)
CO2: 31 mmol/L (ref 22–32)
Calcium: 8.6 mg/dL — ABNORMAL LOW (ref 8.9–10.3)
Chloride: 94 mmol/L — ABNORMAL LOW (ref 98–111)
Creatinine, Ser: 3.29 mg/dL — ABNORMAL HIGH (ref 0.44–1.00)
GFR calc Af Amer: 18 mL/min — ABNORMAL LOW (ref >60)
GFR calc non Af Amer: 16 mL/min — ABNORMAL LOW (ref >60)
Glucose, Bld: 137 mg/dL — ABNORMAL HIGH (ref 70–99)
Potassium: 3.8 mmol/L (ref 3.5–5.1)
Sodium: 140 mmol/L (ref 135–145)

## 2018-05-14 LAB — AMMONIA: Ammonia: 55 umol/L — ABNORMAL HIGH (ref 9–35)

## 2018-05-14 LAB — GLUCOSE, CAPILLARY
Glucose-Capillary: 122 mg/dL — ABNORMAL HIGH (ref 70–99)
Glucose-Capillary: 129 mg/dL — ABNORMAL HIGH (ref 70–99)
Glucose-Capillary: 159 mg/dL — ABNORMAL HIGH (ref 70–99)

## 2018-05-14 LAB — CK: Total CK: 926 U/L — ABNORMAL HIGH (ref 38–234)

## 2018-05-14 LAB — VITAMIN B12: Vitamin B-12: 7426 pg/mL — ABNORMAL HIGH (ref 180–914)

## 2018-05-14 LAB — MAGNESIUM: Magnesium: 2.6 mg/dL — ABNORMAL HIGH (ref 1.7–2.4)

## 2018-05-14 LAB — PHOSPHORUS: Phosphorus: 3.7 mg/dL (ref 2.5–4.6)

## 2018-05-14 MED ORDER — SODIUM CHLORIDE 0.9 % IV SOLN
1.0000 g | INTRAVENOUS | Status: DC
  Administered 2018-05-15 – 2018-05-17 (×3): 1 g via INTRAVENOUS
  Filled 2018-05-14 (×4): qty 1

## 2018-05-14 MED ORDER — BUDESONIDE 0.25 MG/2ML IN SUSP
0.2500 mg | Freq: Four times a day (QID) | RESPIRATORY_TRACT | Status: DC
  Administered 2018-05-14 (×2): 0.25 mg via RESPIRATORY_TRACT
  Filled 2018-05-14 (×2): qty 2

## 2018-05-14 MED ORDER — VITAL HIGH PROTEIN PO LIQD
1000.0000 mL | ORAL | Status: DC
  Administered 2018-05-14 – 2018-05-15 (×2): 1000 mL

## 2018-05-14 MED ORDER — ALBUTEROL SULFATE (2.5 MG/3ML) 0.083% IN NEBU: 2.5000 mg | INHALATION_SOLUTION | RESPIRATORY_TRACT | Status: DC | PRN

## 2018-05-14 MED ORDER — IPRATROPIUM-ALBUTEROL 0.5-2.5 (3) MG/3ML IN SOLN
3.0000 mL | Freq: Four times a day (QID) | RESPIRATORY_TRACT | Status: DC
  Administered 2018-05-14 – 2018-05-24 (×37): 3 mL via RESPIRATORY_TRACT
  Filled 2018-05-14 (×41): qty 3

## 2018-05-14 MED ORDER — METHYLPREDNISOLONE SODIUM SUCC 125 MG IJ SOLR
80.0000 mg | Freq: Two times a day (BID) | INTRAMUSCULAR | Status: DC
  Administered 2018-05-14 – 2018-05-16 (×4): 80 mg via INTRAVENOUS
  Filled 2018-05-14 (×4): qty 2

## 2018-05-14 MED ORDER — PANTOPRAZOLE SODIUM 40 MG PO PACK
40.0000 mg | PACK | Freq: Every day | ORAL | Status: DC
  Administered 2018-05-14 – 2018-05-18 (×5): 40 mg
  Filled 2018-05-14 (×5): qty 20

## 2018-05-14 MED ORDER — PRO-STAT SUGAR FREE PO LIQD
30.0000 mL | Freq: Two times a day (BID) | ORAL | Status: DC
  Administered 2018-05-14 – 2018-05-15 (×3): 30 mL
  Filled 2018-05-14 (×3): qty 30

## 2018-05-14 MED ORDER — LACTATED RINGERS IV BOLUS
1000.0000 mL | Freq: Once | INTRAVENOUS | Status: AC
  Administered 2018-05-14: 13:00:00 1000 mL via INTRAVENOUS

## 2018-05-14 MED ORDER — PANTOPRAZOLE SODIUM 40 MG IV SOLR
40.0000 mg | INTRAVENOUS | Status: DC
  Administered 2018-05-14: 40 mg via INTRAVENOUS
  Filled 2018-05-14: qty 40

## 2018-05-14 MED ORDER — INSULIN ASPART 100 UNIT/ML ~~LOC~~ SOLN
0.0000 [IU] | SUBCUTANEOUS | Status: DC
  Administered 2018-05-14 (×2): 3 [IU] via SUBCUTANEOUS
  Administered 2018-05-14 – 2018-05-15 (×4): 2 [IU] via SUBCUTANEOUS
  Administered 2018-05-15 (×3): 3 [IU] via SUBCUTANEOUS
  Administered 2018-05-16 (×2): 2 [IU] via SUBCUTANEOUS
  Administered 2018-05-16 (×2): 3 [IU] via SUBCUTANEOUS
  Administered 2018-05-17: 2 [IU] via SUBCUTANEOUS
  Administered 2018-05-17 (×2): 3 [IU] via SUBCUTANEOUS
  Administered 2018-05-17 – 2018-05-18 (×3): 2 [IU] via SUBCUTANEOUS
  Administered 2018-05-19: 12:00:00 3 [IU] via SUBCUTANEOUS
  Administered 2018-05-20 – 2018-05-22 (×5): 2 [IU] via SUBCUTANEOUS
  Administered 2018-05-23: 3 [IU] via SUBCUTANEOUS
  Administered 2018-05-24: 2 [IU] via SUBCUTANEOUS
  Administered 2018-05-25: 3 [IU] via SUBCUTANEOUS

## 2018-05-14 NOTE — Progress Notes (Signed)
Assisted tele visit to patient with husband.  Misty Green, [name], RN

## 2018-05-14 NOTE — Progress Notes (Signed)
..   NAME:  Misty Green, MRN:  [id number], DOB:  September 05, 1970, LOS: 2 ADMISSION DATE:  05/12/2018, CONSULTATION DATE:  05/13/2018 REFERRING MD:  ED PROVIDER, CHIEF COMPLAINT:  S/p cardiac arrest   Brief History   48 yr old F w/ PMHx COPD, CHF, chronic back pain from spinal stenoses, diverticulosis s/p colostomy presents s/p cardiac arrest. PCCM consulted for admission.  History of present illness   (History obtained from EMR, family and acct of other providers)  48 yr old F w/ PMHx sig for COPD, CHF, spinal stenoses, diverticulosis s/p colostomy  Her baseline is awake alert oriented x 4 fully functional with iADLS per her daughter she is not very compliant with her medications, she last saw her primary doctor almost a yr ago per her husband.  She uses a CPAP at night which she received 3L O2 through but does not require supplemental oxygen during awake and active periods.  Per her husband the patient started feeling ill ( symptoms included: cough, nasal congestion, occasional SOB) on 05/07/2018 her symptoms became worse on Wednesday 4/22  She complained of a headache accompanied by nausea and vomiting unable to tolerate oral intake. On Thursday 4/23 she continued to have the headache despite tylenol but was drinking fluids, and had a 1/2 can soup and some crackers.  The morning of 05/12/18 she complained of hot and cold flashes. She took tylenol throughout the week for these symptoms. [name] does not know how much she took.   When he found her unresponsive at 8 pm he called 911 immediately he states that he cleared the bed and laid her flat shortly after she began to vomit. He was instructed to turn her on her side which he did. He states that he believes it was  5 mins between the time 911 was called and when [facility] arrived.   Downtime unknown: in the EMR it is listed as 13-14 min code but upon further investigation  per husband states that the pt had been "sleeping" all day and he does not have a  definitive time. He found her unresponsive at 8 pm but He last spoke to her at noon and she verbally responded at that time and went back to sleep.  Daughter states that she doesn't live with he mother but spoke to her via telephone throughout the week up until 4/23 in the evening and her mom denied any complaints to her. She states that her mother had a cardiac arrest back when she was pregnant with her brother and was diagnosed with peripartum cardiomyopathy. She is supposed to be on lasix which she doesn't take.  She presents on 4/25 to [facility] s/p cardiac arrest (initial rhythm unknown). King airway was placed by EMS and she was started on Epi gtt. Intubated in the ED received RSI at 2120. COVID negative off of vasopressors. PCCM consulted.  Past Medical History  .Marland Kitchen Active Ambulatory Problems    Diagnosis Date Noted  . Acute respiratory failure ([place]) 03/22/2016  . COPD exacerbation ([place]) 03/22/2016  . Chronic back pain 03/22/2016  . Opioid type dependence, abuse ([place]) 03/22/2016  . Polycythemia 03/22/2016  . Anxiety 03/22/2016  . OSA on CPAP 03/22/2016  . Diverticulitis 12/03/2017  . Closed fracture of distal end of right radius 08/25/2015  . Closed nondisplaced fracture of styloid process of right ulna 08/25/2015  . Mixed anxiety and depressive disorder 12/22/2017  . Obesity 12/22/2017  . Spinal stenosis of lumbar region 04/12/2016  . Tobacco user 12/22/2017  .  Acute diverticulitis 01/06/2018  . Diverticulitis of colon (without mention of hemorrhage)(562.11)    Resolved Ambulatory Problems    Diagnosis Date Noted  . No Resolved Ambulatory Problems   Past Medical History:  Diagnosis Date  . CHF (congestive heart failure) ([place])   . COPD (chronic obstructive pulmonary disease) ([place])   . Current smoker   . Opiate abuse, continuous ([place])    Significant Hospital Events   s/p cardiac arrest Endotracheally intubated on 05/12/2018  Consults:  PCCM 4/24 Neurology 4/25   Procedures:  Endotracheal intubation in ED>4/24 received RSI   Significant Diagnostic Tests:  CT Head w/o contrast no acute abnormalities  Micro Data:  Blood cx collected on 05/12/2018  Antimicrobials:  COVID 19 negative >4/24 RVP 4/24> negative  Interim history/subjective:  Required just one dose of nibmex last night for vent dyssynchrony.  Objective   Blood pressure 97/70, pulse 68, temperature 98.1 F (36.7 C), temperature source Oral, resp. rate (!) 22, height 5\' 1"  (1.549 m), weight 86.7 kg, SpO2 96 %.    Vent Mode: PRVC FiO2 (%):  [50 %] 50 % Set Rate:  [20 bmp-28 bmp] 22 bmp Vt Set:  [410 mL-500 mL] 410 mL PEEP:  [5 cmH20-10 cmH20] 10 cmH20 Delta P (Amplitude):  [28] 28 Plateau Pressure:  [15 cmH20-25 cmH20] 22 cmH20   Intake/Output Summary (Last 24 hours) at 05/14/2018 0858 Last data filed at 05/14/2018 0800 Gross per 24 hour  Intake 1213.54 ml  Output 215 ml  Net 998.54 ml   Filed Weights   05/13/18 0256 05/14/18 0500  Weight: 85.6 kg 86.7 kg    Examination: General: ill appearing female, nonresponsive HEENT: No JVD, pupils are equal and reactive Neuro: Moves left side greater than right side and not CV: RRR, no murmurs PULM: even/non-labored, lungs bilaterally decreased in the bases GI: soft, non-tender, decreased bowel sounds Extremities: warm/dry, 1+ nonpitting edema  Skin: no rashes or lesions   Assessment & Plan:  S/p cardiac arrest: initial rhythm unknown presumed  PEA Not a candidate for TTM while it is documented as a 13-14 min code time there is an unknown amt of down time that is unaccounted for prior to Fire arriving. Neurological exam on admission concerning for significant anoxic brain injury.  - maintain normothermia - telemetry - DNR per family request  Acute hypoxic and hypercapnic respiratory failure  ?aspiration event H/o COPD on Advair, Spiriva and Daliresp uses a cpap w/ oxygen at night In setting of cardiac arrest, possible  aspiration event per family report. Yesterday had vent dyssynchrony which improved with bronchodilators, TV decrease and sedation. COVID and RVP negative on admission. -Solu-Medrol 60 mg IV every 6 -bronchodilators to every 4 -cefepime, flagyl -f/u trach aspirate culture, Blood cultures -propofol, prn nimbex  Acute encephalopathy s/p cardiac arrest Poor neurological exam reported several hours after RSI meds and before other sedatives started. CT head on admission negative. Neurology is on board. EEG revealed discontinuous generalized background slowing likely related to medication effect. She will be getting an MRI brain later this morning.  - f/u MRI brain - appreciated neurology input  Septic shock vs Cardiogenic shock S/p cardiac arrest initial rhythm unknown Pt has a h/o CHF per daughter and is non compliant w/ Lasix; last EF documented 60-65% in 2018. Bedside ECHO shows her squeeze approx. 40-50% ( may be secondary to myocardial stunning post code). Had leukocytosis, fever and elevated pro calcitonin on admission with report of possible aspiration event though no distinct infiltrates on cxr. -  goal MAP >65 - continue cefepime/flagyl  Elevated Transaminases In setting of cardiac arrest with unknown down time; likely represents shock liver. Transaminates trending down.   Acute Kidney Injury - likely ATN Baseline creat  0.70 >1.4>3.29 with <100cc out in last 24hrs. Foley in place. - Maintain adequate blood pressure - if poor neurologic prognosis, would not recommend HD  Best practice:  Diet: NPO Pain/Anxiety/Delirium protocol (if indicated): propofol, PRN nbx VAP protocol (if indicated): yes DVT prophylaxis: hep [place] and SCDs GI prophylaxis: Protonix Glucose control: if BG exceeds 180 mg/dl  Mobility: bedrest Code Status: No compressions No ACLS No defibrillation No ACLS meds Family Communication: 4/25, will call today after MRI  Disposition: ICU  Labs   CBC: Recent Labs   Lab 05/12/18 2135 05/12/18 2147 05/13/18 0147 05/13/18 1107 05/13/18 1454 05/14/18 0652  WBC 11.5*  --   --   --   --  12.8*  NEUTROABS 8.3*  --   --   --   --  11.3*  HGB 13.3 15.0 13.9 12.9 12.9 11.5*  HCT 46.5* 44.0 41.0 38.0 38.0 37.8  MCV 105.7*  --   --   --   --  97.4  PLT 294  --   --   --   --  983    Basic Metabolic Panel: Recent Labs  Lab 05/12/18 2135 05/12/18 2147 05/13/18 0147 05/13/18 1107 05/13/18 1454 05/14/18 0652  NA 140 134* 138 141 140 140  K 4.6 4.7 4.1 3.0* 3.4* 3.8  CL 89*  --   --   --   --  94*  CO2 32  --   --   --   --  31  GLUCOSE 107*  --   --   --   --  137*  BUN 14  --   --   --   --  51*  CREATININE 1.40* 1.20*  --   --   --  3.29*  CALCIUM 9.4  --   --   --   --  8.6*  MG  --   --   --   --   --  2.6*  PHOS  --   --   --   --   --  3.7   GFR: Estimated Creatinine Clearance: 20.9 mL/min (A) (by C-G formula based on SCr of 3.29 mg/dL (H)). Recent Labs  Lab 05/12/18 2135 05/13/18 0034 05/14/18 0652  PROCALCITON 0.68  --   --   WBC 11.5*  --  12.8*  LATICACIDVEN 7.7* 3.1*  --     Liver Function Tests: Recent Labs  Lab 05/12/18 2135  AST 887*  ALT 924*  ALKPHOS 105  BILITOT 0.9  PROT 7.4  ALBUMIN 3.0*   No results for input(s): LIPASE, AMYLASE in the last 168 hours. No results for input(s): AMMONIA in the last 168 hours.  ABG    Component Value Date/Time   PHART 7.311 (L) 05/13/2018 1800   PCO2ART 69.4 (HH) 05/13/2018 1800   PO2ART 103 05/13/2018 1800   HCO3 33.6 (H) 05/13/2018 1800   TCO2 40 (H) 05/13/2018 1454   O2SAT 96.5 05/13/2018 1800     Coagulation Profile: No results for input(s): INR, PROTIME in the last 168 hours.  Cardiac Enzymes: No results for input(s): CKTOTAL, CKMB, CKMBINDEX, TROPONINI in the last 168 hours.  HbA1C: No results found for: HGBA1C  CBG: Recent Labs  Lab 05/12/18 2155  GLUCAP 95   Alphonzo Grieve, MD PGY3  Pager [phone] If no answer page [phone] 05/14/2018,  8:58 AM

## 2018-05-14 NOTE — Progress Notes (Signed)
RT NOTE: RT transported patient with RN from 2H15 to MRI and back without complications. VS stable. RT will continue to monitor.

## 2018-05-14 NOTE — Progress Notes (Addendum)
Neurology Progress Note   S:// Seen and examined. Needed to be given paralytics once in last 24h due to vent dissynchrony. Taken for MRI today - results pending On Prop and Fentanyl drip Sedation held for exam.  O:// Current vital signs: BP (!) 92/59   Pulse 69   Temp 98.1 F (36.7 C)   Resp (!) 22   Ht _0  (1.549 m) Comment: measured at bedside  Wt 86.7 kg   SpO2 99%   BMI 36.12 kg/m  Vital signs in last 24 hours: Temp:  [97.9 F (36.6 C)-100.6 F (38.1 C)] 98.1 F (36.7 C) (04/26 0900) Pulse Rate:  [66-107] 69 (04/26 0900) Resp:  [0-34] 22 (04/26 0900) BP: (87-154)/(48-99) 92/59 (04/26 0900) SpO2:  [92 %-99 %] 99 % (04/26 1155) FiO2 (%):  [40 %-50 %] 40 % (04/26 1155) Weight:  [86.7 kg] 86.7 kg (04/26 0500) Gen: Sedated intubated - sedation held for exam HEENT: [place] AT ETT in place CVS: S1S2+, rrr Chest: vented, synchronous with vent and on stimulation, breathes over the vent Ext: trace edema, warm well perfused NEUROLOGICAL EXAM Sedated and intubated Sedation with propofol and fentanyl held for the exam Does not open eyes to voice initially but upon re-exam with sedation held for 10 m in, opens eyes spontaneously and attempts to look around but not to command. To nox stim (deep sternal rub), some grimace and possible attempt at eye opening noted. CN: PERRL, eyes appear slightly disconjugate - right eye slightly down and in relative to left, does not blink to threat, facial symmetry difficult to assess due to ETT Motor: initially no movement (spontanous or to nox stim) but once sedation held for a while, moving all 4 ext spontaneously and withdrawing purposefully in all 4s. Sensory as above Coord and gait can not be tested   Medications  Current Facility-Administered Medications:  .  albuterol (PROVENTIL) (2.5 MG/3ML) 0.083% nebulizer solution 2.5 mg, 2.5 mg, Nebulization, Q3H PRN, Wilhelmina Mcardle, MD .  budesonide (PULMICORT) nebulizer solution 0.25 mg, 0.25  mg, Nebulization, Q6H, Simonds, David B, MD .  ceFEPIme (MAXIPIME) 2 g in sodium chloride 0.9 % 100 mL IVPB, 2 g, Intravenous, Q8H, Lyndee Leo, RPH, Stopped at 05/14/18 0606 .  chlorhexidine gluconate (MEDLINE KIT) (PERIDEX) 0.12 % solution 15 mL, 15 mL, Mouth Rinse, BID, Scatliffe, Kristen D, MD, 15 mL at 05/14/18 0723 .  Chlorhexidine Gluconate Cloth 2 % PADS 6 each, 6 each, Topical, Daily, Scatliffe, Rise Paganini, MD, 6 each at 05/13/18 2204 .  cisatracurium (NIMBEX) injection 8.6 mg, 0.1 mg/kg, Intravenous, Q2H PRN, Minor, Grace Bushy, NP, 8.6 mg at 05/13/18 1610 .  feeding supplement (PRO-STAT SUGAR FREE 64) liquid 30 mL, 30 mL, Per Tube, BID, Simonds, David B, MD .  feeding supplement (VITAL HIGH PROTEIN) liquid 1,000 mL, 1,000 mL, Per Tube, Q24H, Wilhelmina Mcardle, MD .  fentaNYL (SUBLIMAZE) injection 25-100 mcg, 25-100 mcg, Intravenous, Q1H PRN, Minor, Grace Bushy, NP, 100 mcg at 05/13/18 1207 .  fentaNYL 25102mg in NS 2535m(1022mml) infusion-PREMIX, 0-400 mcg/hr, Intravenous, Continuous, Minor, WilGrace BushyP, Last Rate: 10 mL/hr at 05/14/18 1145, 100 mcg/hr at 05/14/18 1145 .  heparin injection 5,000 Units, 5,000 Units, Subcutaneous, Q8H, Scatliffe, KriRise PaganiniD, 5,000 Units at 05/14/18 0536 .  insulin aspart (novoLOG) injection 0-15 Units, 0-15 Units, Subcutaneous, Q4H, Simonds, David B, MD .  ipratropium-albuterol (DUONEB) 0.5-2.5 (3) MG/3ML nebulizer solution 3 mL, 3 mL, Nebulization, Q6H, Simonds, DavZella RicherD .  lactated ringers  bolus 1,000 mL, 1,000 mL, Intravenous, Once, Merton Border B, MD .  MEDLINE mouth rinse, 15 mL, Mouth Rinse, 10 times per day, Scatliffe, Rise Paganini, MD, 15 mL at 05/14/18 0943 .  methylPREDNISolone sodium succinate (SOLU-MEDROL) 125 mg/2 mL injection 80 mg, 80 mg, Intravenous, Q12H, Simonds, David B, MD .  pantoprazole sodium (PROTONIX) 40 mg/20 mL oral suspension 40 mg, 40 mg, Per Tube, Q1200, Wilhelmina Mcardle, MD .  propofol (DIPRIVAN) 1000 MG/100ML infusion,  5-80 mcg/kg/min, Intravenous, Titrated, Minor, Grace Bushy, NP, Last Rate: 25.7 mL/hr at 05/14/18 1145, 50 mcg/kg/min at 05/14/18 1145 .  sodium chloride flush (NS) 0.9 % injection 10-40 mL, 10-40 mL, Intracatheter, PRN, Scatliffe, Kristen D, MD   Labs CBC    Component Value Date/Time   WBC 12.8 (H) 05/14/2018 0652   RBC 3.88 05/14/2018 0652   HGB 11.5 (L) 05/14/2018 0652   HCT 37.8 05/14/2018 0652   HCT 55.1 (H) 03/22/2016 0555   PLT 214 05/14/2018 0652   MCV 97.4 05/14/2018 0652   MCH 29.6 05/14/2018 0652   MCHC 30.4 05/14/2018 0652   RDW 13.2 05/14/2018 0652   LYMPHSABS 0.9 05/14/2018 0652   MONOABS 0.5 05/14/2018 0652   EOSABS 0.0 05/14/2018 0652   BASOSABS 0.0 05/14/2018 0652    CMP     Component Value Date/Time   NA 140 05/14/2018 0652   K 3.8 05/14/2018 0652   CL 94 (L) 05/14/2018 0652   CO2 31 05/14/2018 0652   GLUCOSE 137 (H) 05/14/2018 0652   BUN 51 (H) 05/14/2018 0652   CREATININE 3.29 (H) 05/14/2018 0652   CALCIUM 8.6 (L) 05/14/2018 0652   PROT 7.4 05/12/2018 2135   ALBUMIN 3.0 (L) 05/12/2018 2135   AST 887 (H) 05/12/2018 2135   ALT 924 (H) 05/12/2018 2135   ALKPHOS 105 05/12/2018 2135   BILITOT 0.9 05/12/2018 2135   GFRNONAA 16 (L) 05/14/2018 0652   GFRAA 18 (L) 05/14/2018 4627    Imaging I have reviewed images in epic and the results pertinent to this consultation are: MRI brain: not suggestive of anoxic injury, no ischemic stroke, no bleed.  Assessment: 47/F with PMH of COPD, smoking, opiod abuse, presenting after a cardiac arrest of unknown time. She was not put on TTM. Intubated due to poor GCS. Neurology consulted for prognostication. Seen by my colleague Dr. Cheral Marker, on arrival, close to intubation with a poor neurological exam. For me today, she is encephalopathic, but non focal in terms of any CN, motor or sensory exam abnormality. EEG was negative for seizure and not low voltage but slow indicating polyfactorial toxic metabolic  encephalopathy. I do think there might be a component of Hypoxic Ischemic Encephalopathy but I can not say for sure if she has severe anoxic brain injury. Given the unknown or the long down time prior to ROSC, I am not sure if that is entirely accurate because if she was down for that long, 2 days after the event, the MR should have shown damage suggestive of anoxic injury, which it doesn't.  Impression: Hypoxic Ischemic Encephalopathy s/p cardiac arrest COPD  Recommendations: -Correct toxic metabolic derangements per PCCM as you are - especially deranged renal function -Check NH3, B12, RPR and ABG -Management of COPD per Pulmonary Critical Care Medicine -Minimize sedation as tolerated (from a vent desynchrony perspective)  Neurology will follow with you with repeat exams - based on today's exam, I would expect some improvement in her mentation when she is examined next.  Please call  with questions.  -- Amie Portland, MD [facility] Pager: [phone] If 7pm to 7am, please call on call as listed on AMION.

## 2018-05-14 NOTE — Progress Notes (Signed)
Spoke to daughter who has requested that morning update is called to her, Pt's husband (primary contact) has agreed to this. [phone]

## 2018-05-14 NOTE — Progress Notes (Signed)
Husband called this RN and requested DNR status be rescinded. MD notified and purple DNR bracelet removed.

## 2018-05-15 ENCOUNTER — Inpatient Hospital Stay (HOSPITAL_COMMUNITY): Payer: 59

## 2018-05-15 LAB — CBC WITH DIFFERENTIAL/PLATELET
Abs Immature Granulocytes: 1.1 10*3/uL — ABNORMAL HIGH (ref 0.00–0.07)
Band Neutrophils: 5 %
Basophils Absolute: 0 10*3/uL (ref 0.0–0.1)
Basophils Relative: 0 %
Eosinophils Absolute: 0 10*3/uL (ref 0.0–0.5)
Eosinophils Relative: 0 %
HCT: 35.8 % — ABNORMAL LOW (ref 36.0–46.0)
Hemoglobin: 11.1 g/dL — ABNORMAL LOW (ref 12.0–15.0)
Lymphocytes Relative: 10 %
Lymphs Abs: 2.2 10*3/uL (ref 0.7–4.0)
MCH: 29.6 pg (ref 26.0–34.0)
MCHC: 31 g/dL (ref 30.0–36.0)
MCV: 95.5 fL (ref 80.0–100.0)
Metamyelocytes Relative: 3 %
Monocytes Absolute: 1.1 10*3/uL — ABNORMAL HIGH (ref 0.1–1.0)
Monocytes Relative: 5 %
Myelocytes: 2 %
Neutro Abs: 18.9 10*3/uL — ABNORMAL HIGH (ref 1.7–7.7)
Neutrophils Relative %: 75 %
Platelets: 217 10*3/uL (ref 150–400)
RBC: 3.75 MIL/uL — ABNORMAL LOW (ref 3.87–5.11)
RDW: 13.1 % (ref 11.5–15.5)
WBC: 22.2 10*3/uL — ABNORMAL HIGH (ref 4.0–10.5)
nRBC: 2 % — ABNORMAL HIGH (ref 0.0–0.2)
nRBC: 2 /100 WBC — ABNORMAL HIGH

## 2018-05-15 LAB — PROTEIN / CREATININE RATIO, URINE
Creatinine, Urine: 185.37 mg/dL
Protein Creatinine Ratio: 1.59 mg/mg{Cre} — ABNORMAL HIGH (ref 0.00–0.15)
Total Protein, Urine: 295 mg/dL

## 2018-05-15 LAB — GLUCOSE, CAPILLARY
Glucose-Capillary: 167 mg/dL — ABNORMAL HIGH (ref 70–99)
Glucose-Capillary: 136 mg/dL — ABNORMAL HIGH (ref 70–99)
Glucose-Capillary: 155 mg/dL — ABNORMAL HIGH (ref 70–99)
Glucose-Capillary: 145 mg/dL — ABNORMAL HIGH (ref 70–99)
Glucose-Capillary: 110 mg/dL — ABNORMAL HIGH (ref 70–99)
Glucose-Capillary: 183 mg/dL — ABNORMAL HIGH (ref 70–99)

## 2018-05-15 LAB — CBC
HCT: 34.4 % — ABNORMAL LOW (ref 36.0–46.0)
Hemoglobin: 11.1 g/dL — ABNORMAL LOW (ref 12.0–15.0)
MCH: 30.7 pg (ref 26.0–34.0)
MCHC: 32.3 g/dL (ref 30.0–36.0)
MCV: 95 fL (ref 80.0–100.0)
Platelets: 203 10*3/uL (ref 150–400)
RBC: 3.62 MIL/uL — ABNORMAL LOW (ref 3.87–5.11)
RDW: 13 % (ref 11.5–15.5)
WBC: 23.8 10*3/uL — ABNORMAL HIGH (ref 4.0–10.5)
nRBC: 1.9 % — ABNORMAL HIGH (ref 0.0–0.2)

## 2018-05-15 LAB — COMPREHENSIVE METABOLIC PANEL
ALT: 2892 U/L — ABNORMAL HIGH (ref 0–44)
AST: 1441 U/L — ABNORMAL HIGH (ref 15–41)
Albumin: 2.4 g/dL — ABNORMAL LOW (ref 3.5–5.0)
Alkaline Phosphatase: 136 U/L — ABNORMAL HIGH (ref 38–126)
Anion gap: 16 — ABNORMAL HIGH (ref 5–15)
BUN: 78 mg/dL — ABNORMAL HIGH (ref 6–20)
CO2: 26 mmol/L (ref 22–32)
Calcium: 8.2 mg/dL — ABNORMAL LOW (ref 8.9–10.3)
Chloride: 96 mmol/L — ABNORMAL LOW (ref 98–111)
Creatinine, Ser: 4.25 mg/dL — ABNORMAL HIGH (ref 0.44–1.00)
GFR calc Af Amer: 13 mL/min — ABNORMAL LOW (ref >60)
GFR calc non Af Amer: 12 mL/min — ABNORMAL LOW (ref >60)
Glucose, Bld: 143 mg/dL — ABNORMAL HIGH (ref 70–99)
Potassium: 3.5 mmol/L (ref 3.5–5.1)
Sodium: 138 mmol/L (ref 135–145)
Total Bilirubin: 1 mg/dL (ref 0.3–1.2)
Total Protein: 6.3 g/dL — ABNORMAL LOW (ref 6.5–8.1)

## 2018-05-15 LAB — CULTURE, RESPIRATORY W GRAM STAIN: Special Requests: NORMAL

## 2018-05-15 LAB — PROCALCITONIN: Procalcitonin: 9.04 ng/mL

## 2018-05-15 LAB — SODIUM, URINE, RANDOM: Sodium, Ur: 18 mmol/L

## 2018-05-15 LAB — STREP PNEUMONIAE URINARY ANTIGEN: Strep Pneumo Urinary Antigen: NEGATIVE

## 2018-05-15 LAB — LACTIC ACID, PLASMA: Lactic Acid, Venous: 0.8 mmol/L (ref 0.5–1.9)

## 2018-05-15 LAB — CULTURE, RESPIRATORY

## 2018-05-15 MED ORDER — BUDESONIDE 0.25 MG/2ML IN SUSP
0.2500 mg | Freq: Two times a day (BID) | RESPIRATORY_TRACT | Status: DC
  Administered 2018-05-15 – 2018-05-23 (×16): 0.25 mg via RESPIRATORY_TRACT
  Filled 2018-05-15 (×19): qty 2

## 2018-05-15 MED ORDER — DEXMEDETOMIDINE HCL IN NACL 400 MCG/100ML IV SOLN
0.4000 ug/kg/h | INTRAVENOUS | Status: DC
  Administered 2018-05-15: 12:00:00 1.2 ug/kg/h via INTRAVENOUS
  Administered 2018-05-15: 1 ug/kg/h via INTRAVENOUS
  Administered 2018-05-15: 09:00:00 0.4 ug/kg/h via INTRAVENOUS
  Administered 2018-05-16 (×4): 1 ug/kg/h via INTRAVENOUS
  Administered 2018-05-16: 21:00:00 1.2 ug/kg/h via INTRAVENOUS
  Administered 2018-05-17: 12:00:00 1 ug/kg/h via INTRAVENOUS
  Administered 2018-05-17 (×3): 1.2 ug/kg/h via INTRAVENOUS
  Administered 2018-05-17 (×2): 1 ug/kg/h via INTRAVENOUS
  Administered 2018-05-18 – 2018-05-19 (×9): 1.2 ug/kg/h via INTRAVENOUS
  Filled 2018-05-15 (×24): qty 100

## 2018-05-15 MED ORDER — PNEUMOCOCCAL VAC POLYVALENT 25 MCG/0.5ML IJ INJ
0.5000 mL | INJECTION | INTRAMUSCULAR | Status: AC
  Administered 2018-05-16: 10:00:00 0.5 mL via INTRAMUSCULAR
  Filled 2018-05-15: qty 0.5

## 2018-05-15 MED ORDER — VITAL HIGH PROTEIN PO LIQD
1000.0000 mL | ORAL | Status: DC
  Administered 2018-05-15 – 2018-05-17 (×2): 1000 mL

## 2018-05-15 NOTE — Progress Notes (Signed)
VAST RN consulted to place IV.  Pt has had 6 lines in 2 days, including a ML that clotted off.  Spoke with unit RN and discussed IV access. Currently planning to have temporary HD access placed this afternoon. Advised that patient needs CL access d/t numerous incompatible meds and number of PIV's in short period of time. D/t low creatinine clearance and diagnoses pt is not a PICC candidate at this time

## 2018-05-15 NOTE — Progress Notes (Signed)
Assisted tele visit to patient with daughter.  Jessy Cybulski M, RN  

## 2018-05-15 NOTE — Consult Note (Signed)
Misty Green Admit Date: 05/12/2018 05/15/2018 Misty Green Requesting Physician:  Misty Favre Green  Reason for Consult:  AKI HPI:  48 year old female admitted 4/24 after cardiac arrest.  PMH Incudes:  COPD  OSA on CPAP  History of diverticular disease with diverting colostomy  Cardiac arrest was not witnessed and duration unclear.  Return of circulation in the emergency room.  Patient has required ventilatory support.  Neurological work-up with MRI without acute changes, negative EEG.  Trend in SCr below. Appears to have normal baseline. Presented with SCR 1.4 and up to 4.25 today.  K and HCO3 ok, BUN 78.  Renal US w/o acute or structural issues 4/27.  UP/C 1.16.  No UA.  UOP < 116m each day since admission.  No IV contrast exposure. No NSAIDS, ACE/ARB on MAR.   BP stable no pressors needed.    This AM followed some basic commands, first time.  Is agitated when off sedcation  Hx obtained by chart review and discussion with nursing/physicians.     Creatinine, Ser (mg/dL)  Date Value  05/15/2018 4.25 (H)  05/14/2018 3.29 (H)  05/12/2018 1.20 (H)  05/12/2018 1.40 (H)  01/17/2018 0.70  01/15/2018 0.50  01/14/2018 0.39 (L)  01/12/2018 0.48  01/07/2018 0.49  01/06/2018 0.38 (L)  ] I/Os: I/O last 3 completed shifts: In: 2720.2 [I.V.:986.7; NG/GT:282; IV Piggyback:1451.4] Out: 255 [Urine:90; Emesis/NG output:100; Stool:65]   ROS Unable to complete Balance of 12 systems as pt is sedated   PMH  Past Medical History:  Diagnosis Date  . [place] ([place]estive heart failure) [place]   . COPD (chronic obstructive pulmonary disease) ([facility]   . Current smoker   . Opiate abuse, continuous ([facility])    PSH  Past Surgical History:  Procedure Laterality Date  . ABDOMINAL HYSTERECTOMY    . BACK SURGERY    . cesction    . COLOSTOMY  01/13/2018   Procedure: COLOSTOMY Creation;  Surgeon: Misty Husbands Green;  Location: [facility] ORS;  Service: General;;  . INCISIONAL HERNIA REPAIR     lower  midline laparotomy incision (hysterectomy), repaired with mesh  . LAPAROSCOPIC CHOLECYSTECTOMY  2012  . LAPAROSCOPIC LYSIS OF ADHESIONS  01/13/2018   Procedure: LAPAROSCOPIC LYSIS OF ADHESIONS;  Surgeon: Misty Husbands Green;  Location: [facility] ORS;  Service: General;;  . LAPAROSCOPIC SIGMOID COLECTOMY  01/13/2018   Procedure: LAPAROSCOPIC SIGMOID COLECTOMY;  Surgeon: Misty Husbands Green;  Location: [facility] ORS;  Service: General;;  . ORIF WRIST FRACTURE Right 08/25/2015   Procedure: OPEN REDUCTION INTERNAL FIXATION (ORIF) WRIST FRACTURE;  Surgeon: JCorky Mull Green;  Location: [facility] ORS;  Service: Orthopedics;  Laterality: Right;   FH  Family History  Problem Relation Age of Onset  . COPD Mother   . Hypertension Father   . Diabetes Father    SH  reports that she has quit smoking. Her smoking use included cigarettes. She has a 15.00 pack-year smoking history. She has never used smokeless tobacco. She reports current drug use. Drug: Marijuana. She reports that she does not drink alcohol. Allergies  Allergies  Allergen Reactions  . Other Nausea And Vomiting    Anti-depressent that starts with t   Home medications Prior to Admission medications   Medication Sig Start Date End Date Taking? Authorizing Provider  acetaminophen (TYLENOL) 500 MG tablet Take 500 mg by mouth every 6 (six) hours as needed for mild pain or fever.   Yes Provider, Historical, Green  albuterol (PROVENTIL HFA;VENTOLIN HFA) 108 (90 Base) MCG/ACT  inhaler Inhale into the lungs every 6 (six) hours as needed for wheezing or shortness of breath.   Yes Provider, Historical, Green  ALPRAZolam Duanne Moron) 1 MG tablet Take 1 mg by mouth 4 (four) times daily.   Yes Provider, Historical, Green  Ascorbic Acid (VITAMIN C) 1000 MG tablet Take 1,000 mg by mouth daily.   Yes Provider, Historical, Green  diphenhydrAMINE (BENADRYL) 50 MG capsule Take 50 mg by mouth at bedtime as needed for sleep.   Yes Provider, Historical, Green  escitalopram (LEXAPRO) 10 MG tablet  Take 10 mg by mouth daily.   Yes Provider, Historical, Green  Fluticasone-Salmeterol (ADVAIR) 250-50 MCG/DOSE AEPB Inhale 1 puff into the lungs 2 (two) times daily.   Yes Provider, Historical, Green  furosemide (LASIX) 40 MG tablet Take 40 mg by mouth daily as needed for fluid.    Yes Provider, Historical, Green  Multiple Vitamins-Minerals (MULTIVITAMIN WITH MINERALS) tablet Take 1 tablet by mouth daily.   Yes Provider, Historical, Green  pantoprazole (PROTONIX) 40 MG tablet Take 40 mg by mouth daily.   Yes Provider, Historical, Green  roflumilast (DALIRESP) 500 MCG TABS tablet Take 500 mcg by mouth daily.   Yes Provider, Historical, Green  tiotropium (SPIRIVA) 18 MCG inhalation capsule Place 18 mcg into inhaler and inhale daily.   Yes Provider, Historical, Green  cyclobenzaprine (FLEXERIL) 5 MG tablet Take 1 tablet (5 mg total) by mouth 3 (three) times daily. Patient not taking: Reported on 05/13/2018 01/30/18   Misty Hamman Green, Green  docusate sodium (COLACE) 100 MG capsule Take 100 mg by mouth 2 (two) times daily. 01/09/18   Provider, Historical, Green  gabapentin (NEURONTIN) 300 MG capsule Take 800 mg by mouth 3 (three) times daily.     Provider, Historical, Green  metaxalone (SKELAXIN) 400 MG tablet Take 1 tablet (400 mg total) by mouth every 8 (eight) hours as needed for muscle spasms. Patient not taking: Reported on 05/13/2018 01/18/18   Misty Green  ondansetron (ZOFRAN ODT) 4 MG disintegrating tablet Take 1 tablet (4 mg total) by mouth every 8 (eight) hours as needed for nausea or vomiting. Patient not taking: Reported on 05/13/2018 01/30/18   Misty Green  ondansetron (ZOFRAN) 8 MG tablet Take 1 tablet (8 mg total) by mouth every 8 (eight) hours as needed for nausea or vomiting. 02/15/18   Green, Misty Green  zolpidem (AMBIEN) 10 MG tablet Take 10 mg by mouth at bedtime as needed for sleep.    Provider, Historical, Green    Current Medications Scheduled Meds: . budesonide (PULMICORT) nebulizer solution  0.25 mg  Nebulization BID  . chlorhexidine gluconate (MEDLINE KIT)  15 mL Mouth Rinse BID  . Chlorhexidine Gluconate Cloth  6 each Topical Daily  . heparin  5,000 Units Subcutaneous Q8H  . insulin aspart  0-15 Units Subcutaneous Q4H  . ipratropium-albuterol  3 mL Nebulization Q6H  . mouth rinse  15 mL Mouth Rinse 10 times per day  . methylPREDNISolone (SOLU-MEDROL) injection  80 mg Intravenous Q12H  . pantoprazole sodium  40 mg Per Tube Q1200   Continuous Infusions: . ceFEPime (MAXIPIME) IV 200 mL/hr at 05/15/18 0900  . dexmedetomidine (PRECEDEX) IV infusion 1.2 mcg/kg/hr (05/15/18 1219)  . feeding supplement (VITAL HIGH PROTEIN)    . fentaNYL infusion INTRAVENOUS 25 mcg/hr (05/15/18 0900)  . propofol (DIPRIVAN) infusion 10 mcg/kg/min (05/15/18 0900)   PRN Meds:.albuterol, fentaNYL (SUBLIMAZE) injection, sodium chloride flush  CBC Recent Labs  Lab 05/12/18 2135  05/14/18 4970  05/14/18 1416 05/15/18 0229  WBC 11.5*  --  12.8*  --  22.2*  23.8*  NEUTROABS 8.3*  --  11.3*  --  18.9*  HGB 13.3   < > 11.5* 10.9* 11.1*  11.1*  HCT 46.5*   < > 37.8 32.0* 35.8*  34.4*  MCV 105.7*  --  97.4  --  95.5  95.0  PLT 294  --  214  --  217  203   < > = values in this interval not displayed.   Basic Metabolic Panel Recent Labs  Lab 05/12/18 2135 05/12/18 2147 05/13/18 0147 05/13/18 1107 05/13/18 1454 05/14/18 0652 05/14/18 1416 05/15/18 0229  NA 140 134* 138 141 140 140 136 138  K 4.6 4.7 4.1 3.0* 3.4* 3.8 3.8 3.5  CL 89*  --   --   --   --  94*  --  96*  CO2 32  --   --   --   --  31  --  26  GLUCOSE 107*  --   --   --   --  137*  --  143*  BUN 14  --   --   --   --  51*  --  78*  CREATININE 1.40* 1.20*  --   --   --  3.29*  --  4.25*  CALCIUM 9.4  --   --   --   --  8.6*  --  8.2*  PHOS  --   --   --   --   --  3.7  --   --     Physical Exam  Blood pressure 115/68, pulse 62, temperature 98.6 Green (37 C), temperature source Core, resp. rate 17, height 5' 1" (1.549 m), weight 86.7  kg, SpO2 97 %. GEN: intubated, sedated ENT: NCAT, oral ETT in place EYES: eyes closed CV: borerline bradycardic, regular, nl s1s2 no rub PULM: ctab with coarse ant bs ABD: s/nt/nd; LLQ ostomy SKIN: no sig rashes EXT:no sig LEE  Assessment 3F s/p cardiac arrest of unclear duration/ VDRF, HIE, and anuric AKI from normal baseline SCr.  1. Anuric AKI, normal baseline; ischemic ATN; K and HCO3 ok.  Weights up 3-4lb and 4.5L positive from admission; subnephrotic proteinuria 2. Cardiac arrest with resultant HIE, CCM and Neuro following 3. COPD; on steroids 4. Leukocytosis, on steroids 5. Inc LFts, likely shock related; per CCM 6. Longstanding colostomy 7. ? PNA, cefepime   Plan 1. No immediate indicaiton for HD, esp if mental status is improving; but if remains anuric and worsened SCr could be needed in next 1-3d; dtr updated by phone 2. Check UA 3. Cont supporitive care   Pearson Grippe Green 05/15/2018, 1:23 PM

## 2018-05-15 NOTE — Progress Notes (Signed)
Assisted tele visit to patient with husband.  Lenox Ahr, RN

## 2018-05-15 NOTE — Progress Notes (Addendum)
..   NAME:  Misty Green, MRN:  [id number], DOB:  [birth year], LOS: 3 ADMISSION DATE:  05/12/2018, CONSULTATION DATE:  05/13/2018 REFERRING MD:  ED PROVIDER, CHIEF COMPLAINT:  S/p cardiac arrest   Brief History   49 yr old F w/ PMHx COPD, CHF, chronic back pain from spinal stenoses, diverticulosis s/p colostomy presents s/p cardiac arrest. PCCM consulted for admission.  History of present illness   (History obtained from EMR, family and acct of other providers)  48 yr old F w/ PMHx sig for COPD, CHF, spinal stenoses, diverticulosis s/p colostomy  Her baseline is awake alert oriented x 4 fully functional with iADLS per her daughter she is not very compliant with her medications, she last saw her primary doctor almost a yr ago per her husband.  She uses a CPAP at night which she received 3L O2 through but does not require supplemental oxygen during awake and active periods.  Per her husband the patient started feeling ill ( symptoms included: cough, nasal congestion, occasional SOB) on 05/07/2018 her symptoms became worse on Wednesday 4/22  She complained of a headache accompanied by nausea and vomiting unable to tolerate oral intake. On Thursday 4/23 she continued to have the headache despite tylenol but was drinking fluids, and had a 1/2 can soup and some crackers.  The morning of 05/12/18 she complained of hot and cold flashes. She took tylenol throughout the week for these symptoms. [name] does not know how much she took.   When he found her unresponsive at 8 pm he called 911 immediately he states that he cleared the bed and laid her flat shortly after she began to vomit. He was instructed to turn her on her side which he did. He states that he believes it was  5 mins between the time 911 was called and when Fire arrived.   Downtime unknown: in the EMR it is listed as 13-14 min code but upon further investigation  per husband states that the pt had been "sleeping" all day and he does not have a  definitive time. He found her unresponsive at 8 pm but He last spoke to her at noon and she verbally responded at that time and went back to sleep.  Daughter states that she doesn't live with he mother but spoke to her via telephone throughout the week up until 4/23 in the evening and her mom denied any complaints to her. She states that her mother had a cardiac arrest back when she was pregnant with her brother and was diagnosed with peripartum cardiomyopathy. She is supposed to be on lasix which she doesn't take.  She presents on 4/25 to [facility] s/p cardiac arrest (initial rhythm unknown). King airway was placed by EMS and she was started on Epi gtt. Intubated in the ED received RSI at 2120. COVID negative off of vasopressors. PCCM consulted.  Past Medical History  .Marland Kitchen Active Ambulatory Problems    Diagnosis Date Noted  . Acute respiratory failure ([place]) 03/22/2016  . COPD exacerbation ([place]) 03/22/2016  . Chronic back pain 03/22/2016  . Opioid type dependence, abuse ([place]) 03/22/2016  . Polycythemia 03/22/2016  . Anxiety 03/22/2016  . OSA on CPAP 03/22/2016  . Diverticulitis 12/03/2017  . Closed fracture of distal end of right radius 08/25/2015  . Closed nondisplaced fracture of styloid process of right ulna 08/25/2015  . Mixed anxiety and depressive disorder 12/22/2017  . Obesity 12/22/2017  . Spinal stenosis of lumbar region 04/12/2016  . Tobacco user 12/22/2017  .  Acute diverticulitis 01/06/2018  . Diverticulitis of colon (without mention of hemorrhage)(562.11)    Resolved Ambulatory Problems    Diagnosis Date Noted  . No Resolved Ambulatory Problems   Past Medical History:  Diagnosis Date  . CHF (congestive heart failure) ([place])   . COPD (chronic obstructive pulmonary disease) ([place])   . Current smoker   . Opiate abuse, continuous ([place])    Significant Hospital Events   s/p cardiac arrest Endotracheally intubated on 05/12/2018   Consults:  PCCM 4/24 Neurology 4/25   Procedures:  4/24: ett   Significant Diagnostic Tests:  4/25: CT Head w/o contrast no acute abnormalities 4/26: neg for acute process  Micro Data:  4/24 Blood: ngtd 4/24 covid: NEG 4/24 RVP: neg 4/24 resp: ngtd  Antimicrobials:  4/24 cefepime->  Interim history/subjective:  4/26: Required just one dose of nibmex last night for vent dyssynchrony. 4/27: moving spontaneously and with some purpose but not following commands, not tracking.   Objective   Blood pressure (!) 103/54, pulse 61, temperature 99 F (37.2 C), resp. rate 18, height 5\' 1"  (1.549 m), weight 86.7 kg, SpO2 94 %.    Vent Mode: PRVC FiO2 (%):  [40 %-50 %] 40 % Set Rate:  [18 bmp-22 bmp] 18 bmp Vt Set:  [410 mL-500 mL] 500 mL PEEP:  [8 cmH20-10 cmH20] 8 cmH20 Plateau Pressure:  [18 cmH20-22 cmH20] 18 cmH20   Intake/Output Summary (Last 24 hours) at 05/15/2018 0757 Last data filed at 05/15/2018 0700 Gross per 24 hour  Intake 2006.96 ml  Output 225 ml  Net 1781.96 ml   Filed Weights   05/13/18 0256 05/14/18 0500  Weight: 85.6 kg 86.7 kg    Examination: General: ill appearing female, awake but not alert, not tracking, moving spontaneously in bed on vent HEENT: No JVD, pupils are equal and reactive but sluggish Neuro: moves b/l ue spontaneously, with good strength. No purposeful movement, b/l le with some movement  CV: RRR, no murmurs PULM: even/non-labored, lungs bilaterally decreased in the bases GI: soft, non-tender, decreased bowel sounds Extremities: warm/dry, 1+ nonpitting edema  Skin: no rashes or lesions   Assessment & Plan:  S/p cardiac arrest: initial rhythm unknown presumed  PEA Not a candidate for TTM while it is documented as a 13-14 min code time there is an unknown amt of down time that is unaccounted for prior to EMS arriving. Neurological exam on admission concerning for significant anoxic brain injury.  -MRI not c/w anoxic injury - maintain normothermia - telemetry - DNR per family  request Septic shock vs Cardiogenic shock S/p cardiac arrest initial rhythm unknown Suspected asp pna, gn likely Pt has a h/o CHF per daughter and is non compliant w/ Lasix; last EF documented 60-65% in 2018. -Bibasilar infiltrates - goal MAP >65 - continue cefepime -wbc climbing to 23.8, will add diff. Recheck pct and lactic  Acute hypoxic and hypercapnic respiratory failure  Presumed aspiration event with Asp pneumonia, suspect gn H/o COPD on Advair, Spiriva and Daliresp uses a cpap w/ oxygen at night In setting of cardiac arrest, possible aspiration event per family report. Yesterday had vent dyssynchrony which improved with bronchodilators, TV decrease and sedation. COVID and RVP negative on admission. -Solu-Medrol 80 mg IV q12 -bronchodilators to every 4 -cefepime -f/u trach aspirate culture, Blood cultures -propofol, prn nimbex   Acute encephalopathy s/p cardiac arrest Poor neurological exam reported several hours after RSI meds and before other sedatives started. CT head on admission negative. Neurology is on board. EEG  revealed discontinuous generalized background slowing likely related to medication effect.  -MRI negative for acute process - appreciated neurology input - agree with need for correction of metabolic/infectious components as MRI without evidence c/w anoxic injury -will change sedation from propofol to precedex to hopefully help with neuro exam and pt's comfort.    Elevated Transaminases In setting of cardiac arrest with unknown down time; likely represents shock liver. -recheck tomorrow   Acute Kidney Injury anuric- likely ATN Baseline creat  0.70 -> 4.25 -<100cc out in last 24hrs x 2 days.  -Foley in place. - Maintain adequate blood pressure - consult nephro, esp in light of MRI without acute process seen need to ensure correction of metabolic derangements prior to prognositication -order urine studies and renal u/s  Best practice:  Diet: NPO  Pain/Anxiety/Delirium protocol (if indicated): propofol, PRN nbx VAP protocol (if indicated): yes DVT prophylaxis: hep [place] and SCDs GI prophylaxis: Protonix Glucose control: if BG exceeds 180 mg/dl  Mobility: bedrest Code Status: No compressions No ACLS No defibrillation No ACLS meds Family Communication: updated 4/26 Disposition: ICU  Labs   CBC: Recent Labs  Lab 05/12/18 2135  05/13/18 1107 05/13/18 1454 05/14/18 0652 05/14/18 1416 05/15/18 0229  WBC 11.5*  --   --   --  12.8*  --  23.8*  NEUTROABS 8.3*  --   --   --  11.3*  --   --   HGB 13.3   < > 12.9 12.9 11.5* 10.9* 11.1*  HCT 46.5*   < > 38.0 38.0 37.8 32.0* 34.4*  MCV 105.7*  --   --   --  97.4  --  95.0  PLT 294  --   --   --  214  --  203   < > = values in this interval not displayed.    Basic Metabolic Panel: Recent Labs  Lab 05/12/18 2135 05/12/18 2147  05/13/18 1107 05/13/18 1454 05/14/18 0652 05/14/18 1416 05/15/18 0229  NA 140 134*   < > 141 140 140 136 138  K 4.6 4.7   < > 3.0* 3.4* 3.8 3.8 3.5  CL 89*  --   --   --   --  94*  --  96*  CO2 32  --   --   --   --  31  --  26  GLUCOSE 107*  --   --   --   --  137*  --  143*  BUN 14  --   --   --   --  51*  --  78*  CREATININE 1.40* 1.20*  --   --   --  3.29*  --  4.25*  CALCIUM 9.4  --   --   --   --  8.6*  --  8.2*  MG  --   --   --   --   --  2.6*  --   --   PHOS  --   --   --   --   --  3.7  --   --    < > = values in this interval not displayed.   GFR: Estimated Creatinine Clearance: 16.2 mL/min (A) (by C-G formula based on SCr of 4.25 mg/dL (H)). Recent Labs  Lab 05/12/18 2135 05/13/18 0034 05/14/18 0652 05/15/18 0229  PROCALCITON 0.68  --   --   --   WBC 11.5*  --  12.8* 23.8*  LATICACIDVEN 7.7* 3.1*  --   --  Liver Function Tests: Recent Labs  Lab 05/12/18 2135 05/15/18 0229  AST 887* 1,441*  ALT 924* 2,892*  ALKPHOS 105 136*  BILITOT 0.9 1.0  PROT 7.4 6.3*  ALBUMIN 3.0* 2.4*   No results for input(s): LIPASE, AMYLASE  in the last 168 hours. Recent Labs  Lab 05/14/18 1326  AMMONIA 55*    ABG    Component Value Date/Time   PHART 7.373 05/14/2018 1416   PCO2ART 58.2 (H) 05/14/2018 1416   PO2ART 78.0 (L) 05/14/2018 1416   HCO3 33.9 (H) 05/14/2018 1416   TCO2 36 (H) 05/14/2018 1416   O2SAT 95.0 05/14/2018 1416     Coagulation Profile: No results for input(s): INR, PROTIME in the last 168 hours.  Cardiac Enzymes: Recent Labs  Lab 05/14/18 1326  CKTOTAL 926*    HbA1C: No results found for: HGBA1C  CBG: Recent Labs  Lab 05/14/18 1235 05/14/18 1628 05/14/18 1921 05/14/18 2332 05/15/18 0341  GLUCAP 122* 129* 159* 167* 136*   Critical care time: The patient is critically ill with multiple organ systems failure and requires high complexity decision making for assessment and support, frequent evaluation and titration of therapies, application of advanced monitoring technologies and extensive interpretation of multiple databases.  Critical care time 43 mins. This represents my time independent of the NPs time taking care of the pt. This is excluding procedures.    [facility] 05/15/2018, 8:57 AM

## 2018-05-15 NOTE — Progress Notes (Signed)
Nutrition Follow-up  DOCUMENTATION CODES:   Not applicable  INTERVENTION:   Tube feeding via OG tube: - Vital High Protein @ 50 ml/hr (1200 ml/day)  Tube feeding regimen provides 1200 kcal, 106 grams of protein, and 972 ml of H2O (100% of needs).  - d/c Pro-stat  NUTRITION DIAGNOSIS:   Inadequate oral intake related to acute illness as evidenced by NPO status.  Ongoing, being addressed via TF  GOAL:   Patient will meet greater than or equal to 90% of their needs  Met via TF  MONITOR:   Diet advancement, Vent status, Labs, Weight trends, I & O's, TF tolerance  REASON FOR ASSESSMENT:   Consult Enteral/tube feeding initiation and management  ASSESSMENT:    48 yo female admitted post cardiac arrest. PMH includes COPD, CHF, diverticulosis s/p colostomy  Per Neurology, MRI without evidence consistent with anoxic injury. Nephrology consulted today due to worsening AKI and need to correct metabolic derangements prior to prognostication.  OGT in place, currently infusing TF. Discuss plan with RN.  Current TF: Vital High Protein @ 40 ml/hr, Pro-stat 30 ml BID  Patient is currently intubated on ventilator support MV: 8.0 L/min Temp (24hrs), Avg:98 F (36.7 C), Min:97.7 F (36.5 C), Max:99 F (37.2 C) BP: 138/80 MAP: 95  Precedex: 26 ml/hr  Medications reviewed and include: SSI q 4 hours, Solu-medrol, Protonix, IV abx  Labs reviewed: chloride 96 (L), BUN 78 (H), creatinine 4.25 (H), elevated LFTs CBG's: 155, 136, 167, 159, 129, 122 x 24 hours  UOP: 60 ml x 24 hours OGT: 100 ml x 24 hours Colostomy: 65 ml x 24 hours I/O's: +4.5 L since admit  NUTRITION - FOCUSED PHYSICAL EXAM:    Most Recent Value  Orbital Region  No depletion  Upper Arm Region  No depletion  Thoracic and Lumbar Region  No depletion  Buccal Region  Unable to assess  Temple Region  No depletion  Clavicle Bone Region  No depletion  Clavicle and Acromion Bone Region  Mild depletion   Scapular Bone Region  Unable to assess  Dorsal Hand  Unable to assess  Patellar Region  No depletion  Anterior Thigh Region  No depletion  Posterior Calf Region  No depletion  Edema (RD Assessment)  None  Hair  Reviewed  Eyes  Unable to assess  Mouth  Unable to assess  Skin  Reviewed  Nails  Reviewed       Diet Order:   Diet Order    None      EDUCATION NEEDS:   Not appropriate for education at this time  Skin:  Skin Assessment: Reviewed RN Assessment  Last BM:  4/27 via colostomy  Height:   Ht Readings from Last 1 Encounters:  05/12/18 5' 1"  (1.549 m)    Weight:   Wt Readings from Last 1 Encounters:  05/14/18 86.7 kg    Ideal Body Weight:  47.7 kg  BMI:  Body mass index is 36.12 kg/m.  Estimated Nutritional Needs:   Kcal:  1000-1200 kcals   Protein:  96-110 g   Fluid:  >/= 1.5 L    Gaynell Face, MS, RD, LDN Inpatient Clinical Dietitian Pager: [phone] Weekend/After Hours: [phone]

## 2018-05-15 NOTE — Care Management (Signed)
CM unable to complete readmission assessment as pt remains intubated.  CM unable to reach husband.  CM will continue to follow

## 2018-05-15 NOTE — Progress Notes (Signed)
[facility] Progress Note Patient Name: Misty Green DOB: 03-08-1970 MRN: [id number]   Date of Service  05/15/2018  HPI/Events of Note  Agitation - Patient ETT - Request for bilateral soft wrist restraints.   eICU Interventions  Will order bilateral soft wrist restraints.      Intervention Category Minor Interventions: Agitation / anxiety - evaluation and management  Lysle Dingwall 05/15/2018, 7:43 PM

## 2018-05-15 NOTE — Progress Notes (Signed)
RT transported pt from [place] [facility] to [facility] [facility] without event.

## 2018-05-15 NOTE — Progress Notes (Addendum)
NEUROLOGY PROGRESS NOTE  Subjective: Patient intubated however breathing over the vent.  Attempted to get patient to answer questions however unable to answer with yes or no shaking of her head.  Does not appear to be in any pain.  Exam: Vitals:   05/15/18 1000 05/15/18 1127  BP: 115/68   Pulse: 70   Resp: 15   Temp: 98.2 F (36.8 C) 98.6 F (37 C)  SpO2: 94%     Physical Exam   HEENT-  Normocephalic, no lesions, without obvious abnormality.  Normal external eye and conjunctiva.   Extremities- Warm, dry and intact Musculoskeletal-no joint tenderness, deformity or swelling Skin-warm and dry, no hyperpigmentation, vitiligo, or suspicious lesions    Neuro:  Mental Status: Patient initially is resting comfortably however with noxious stimuli sternal rub she does localize with her left hand but not her right.  Does not follow commands.  When attempting to look at her eyes with flashlight she does withdrawal as though the light hurts her eyes.  Later in exam she started to get agitated and was raising both arms, legs and sitting up. Cranial Nerves: II: Blinks to threat,  III,IV, VI: Doll's intact intact bilaterally pupils equal, round, reactive to light and accommodation V,VII: Face appears symmetric however she is intubated, facial light touch sensation normal bilaterally VIII: Opens eyes to voice I Motor: Moves all extremities antigravity.  At one point in time she preference to her left arm compared to her right arm Sensory: Intact to noxious stimuli Deep Tendon Reflexes: 2+ and symmetric throughout Plantars: Right: downgoing   Left: downgoing     Medications:  Scheduled: . budesonide (PULMICORT) nebulizer solution  0.25 mg Nebulization BID  . chlorhexidine gluconate (MEDLINE KIT)  15 mL Mouth Rinse BID  . Chlorhexidine Gluconate Cloth  6 each Topical Daily  . heparin  5,000 Units Subcutaneous Q8H  . insulin aspart  0-15 Units Subcutaneous Q4H  . ipratropium-albuterol   3 mL Nebulization Q6H  . mouth rinse  15 mL Mouth Rinse 10 times per day  . methylPREDNISolone (SOLU-MEDROL) injection  80 mg Intravenous Q12H  . pantoprazole sodium  40 mg Per Tube Q1200   Continuous: . ceFEPime (MAXIPIME) IV 200 mL/hr at 05/15/18 0900  . dexmedetomidine (PRECEDEX) IV infusion 0.4 mcg/kg/hr (05/15/18 0911)  . feeding supplement (VITAL HIGH PROTEIN)    . fentaNYL infusion INTRAVENOUS 25 mcg/hr (05/15/18 0900)  . propofol (DIPRIVAN) infusion 10 mcg/kg/min (05/15/18 0900)    Pertinent Labs/Diagnostics: [id number] Ammonia 55  Mr Brain Wo Contrast  Result Date: 05/14/2018 CLINICAL DATA: MPRESSION: MRI of the brain is negative for anoxic injury or acute infarction/hemorrhage. No evidence of increased intracranial pressure. No focal abnormalities are detected. Electronically Signed   By: Staci Righter M.D.   On: 05/14/2018 11:53   US Renal  Result Date: 05/15/2018 CLINICAL DATA:  Acute renal failure EXAM: RENAL / URINARY TRACT ULTRASOUND COMPLETE COMPARISON:  None. FINDINGS: Right Kidney: Renal measurements: 12.9 x 4.7 x 4.8 cm = volume: 151 mL . Echogenicity within normal limits. No mass or hydronephrosis visualized. Left Kidney: Renal measurements: 14 x 5.7 x 5.6 cm = volume: 231 mL. Echogenicity within normal limits. No mass or hydronephrosis visualized. Bladder: Decompressed with a Foley catheter present. IMPRESSION: Normal renal ultrasound. Electronically Signed   By: Kathreen Devoid   On: 05/15/2018 10:04      Etta Quill PA-C [facility] [phone]   Assessment: 48 year old female with past medical history of COPD, smoking, opiate abuse who presented  to the hospital with cardiac arrest of unknown time.  Allergy was consulted for prognostication.  Currently she is on fentanyl and propofol secondary to her agitation.  Exam again today is nonfocal.  As noted EEG was negative for seizure and not low voltage but slow indicating poly-factorial toxic metabolic  encephalopathy.  MRI was obtained and was negative for anoxic injury.  At this time still unable to follow commands but showing no localizing or lateralizing weakness or abnormalities. 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   I have reviewed the contents of history and physical exam as documented by PA/ARNP/Resident and agree with above documentation.  I have discussed and formulated the above plan as documented. Edits to the note have been made as needed.  Patient has been improving, appears to track examiner and is moving purposely.  Not following commands yet.  Suspect she will continue to improve.  MRI brain is also not suggestive of a severe anoxic injury.  Continue wean sedation.  Suspect that she will continue to improve.  neurology will be available as needed    Karena Addison Evamaria Detore MD [facility] [phone]   If 7pm to 7am, please call on call as listed on AMION.

## 2018-05-15 NOTE — Progress Notes (Signed)
Assisted tele visit to patient with husband.  Adam Phenix, RN

## 2018-05-16 ENCOUNTER — Inpatient Hospital Stay (HOSPITAL_COMMUNITY): Payer: 59

## 2018-05-16 LAB — COMPREHENSIVE METABOLIC PANEL WITH GFR
ALT: 2195 U/L — ABNORMAL HIGH (ref 0–44)
AST: 483 U/L — ABNORMAL HIGH (ref 15–41)
Albumin: 2.6 g/dL — ABNORMAL LOW (ref 3.5–5.0)
Alkaline Phosphatase: 122 U/L (ref 38–126)
Anion gap: 19 — ABNORMAL HIGH (ref 5–15)
BUN: 133 mg/dL — ABNORMAL HIGH (ref 6–20)
CO2: 28 mmol/L (ref 22–32)
Calcium: 8.9 mg/dL (ref 8.9–10.3)
Chloride: 92 mmol/L — ABNORMAL LOW (ref 98–111)
Creatinine, Ser: 6.03 mg/dL — ABNORMAL HIGH (ref 0.44–1.00)
GFR calc Af Amer: 9 mL/min — ABNORMAL LOW (ref >60)
GFR calc non Af Amer: 8 mL/min — ABNORMAL LOW (ref >60)
Glucose, Bld: 166 mg/dL — ABNORMAL HIGH (ref 70–99)
Potassium: 3.7 mmol/L (ref 3.5–5.1)
Sodium: 139 mmol/L (ref 135–145)
Total Bilirubin: 0.7 mg/dL (ref 0.3–1.2)
Total Protein: 6.1 g/dL — ABNORMAL LOW (ref 6.5–8.1)

## 2018-05-16 LAB — GLUCOSE, CAPILLARY
Glucose-Capillary: 139 mg/dL — ABNORMAL HIGH (ref 70–99)
Glucose-Capillary: 139 mg/dL — ABNORMAL HIGH (ref 70–99)
Glucose-Capillary: 149 mg/dL — ABNORMAL HIGH (ref 70–99)
Glucose-Capillary: 154 mg/dL — ABNORMAL HIGH (ref 70–99)
Glucose-Capillary: 156 mg/dL — ABNORMAL HIGH (ref 70–99)
Glucose-Capillary: 169 mg/dL — ABNORMAL HIGH (ref 70–99)
Glucose-Capillary: 170 mg/dL — ABNORMAL HIGH (ref 70–99)
Glucose-Capillary: 97 mg/dL (ref 70–99)

## 2018-05-16 LAB — PROCALCITONIN: Procalcitonin: 7.36 ng/mL

## 2018-05-16 LAB — CBC
HCT: 34.2 % — ABNORMAL LOW (ref 36.0–46.0)
Hemoglobin: 11.3 g/dL — ABNORMAL LOW (ref 12.0–15.0)
MCH: 30.4 pg (ref 26.0–34.0)
MCHC: 33 g/dL (ref 30.0–36.0)
MCV: 91.9 fL (ref 80.0–100.0)
Platelets: 203 K/uL (ref 150–400)
RBC: 3.72 MIL/uL — ABNORMAL LOW (ref 3.87–5.11)
RDW: 13.2 % (ref 11.5–15.5)
WBC: 22.4 K/uL — ABNORMAL HIGH (ref 4.0–10.5)
nRBC: 1.4 % — ABNORMAL HIGH (ref 0.0–0.2)

## 2018-05-16 LAB — RPR: RPR Ser Ql: NONREACTIVE

## 2018-05-16 LAB — PHOSPHORUS: Phosphorus: 2.5 mg/dL (ref 2.5–4.6)

## 2018-05-16 LAB — MAGNESIUM: Magnesium: 2.8 mg/dL — ABNORMAL HIGH (ref 1.7–2.4)

## 2018-05-16 LAB — PATHOLOGIST SMEAR REVIEW

## 2018-05-16 MED ORDER — SODIUM CHLORIDE 0.9 % IV SOLN: 100.0000 mL | INTRAVENOUS | Status: DC | PRN

## 2018-05-16 MED ORDER — ALTEPLASE 2 MG IJ SOLR: 2.0000 mg | Freq: Once | INTRAMUSCULAR | Status: DC | PRN

## 2018-05-16 MED ORDER — METHYLPREDNISOLONE SODIUM SUCC 40 MG IJ SOLR
40.0000 mg | Freq: Two times a day (BID) | INTRAMUSCULAR | Status: DC
  Administered 2018-05-16 – 2018-05-18 (×4): 40 mg via INTRAVENOUS
  Filled 2018-05-16 (×4): qty 1

## 2018-05-16 MED ORDER — PENTAFLUOROPROP-TETRAFLUOROETH EX AERO: 1.0000 "application " | INHALATION_SPRAY | CUTANEOUS | Status: DC | PRN

## 2018-05-16 MED ORDER — CHLORHEXIDINE GLUCONATE CLOTH 2 % EX PADS
6.0000 | MEDICATED_PAD | Freq: Every day | CUTANEOUS | Status: DC
  Administered 2018-05-19 – 2018-05-27 (×7): 6 via TOPICAL

## 2018-05-16 MED ORDER — HEPARIN SODIUM (PORCINE) 1000 UNIT/ML DIALYSIS
1000.0000 [IU] | INTRAMUSCULAR | Status: DC | PRN
  Administered 2018-05-16: 16:00:00 2400 [IU] via INTRAVENOUS_CENTRAL
  Filled 2018-05-16 (×3): qty 1

## 2018-05-16 MED ORDER — MIDAZOLAM HCL 2 MG/2ML IJ SOLN
INTRAMUSCULAR | Status: AC
  Administered 2018-05-16: 2 mg
  Filled 2018-05-16: qty 2

## 2018-05-16 MED ORDER — HEPARIN SODIUM (PORCINE) 1000 UNIT/ML IJ SOLN
INTRAMUSCULAR | Status: AC
  Administered 2018-05-16: 16:00:00 2400 [IU] via INTRAVENOUS_CENTRAL
  Filled 2018-05-16: qty 3

## 2018-05-16 MED ORDER — LIDOCAINE-PRILOCAINE 2.5-2.5 % EX CREA: 1.0000 "application " | TOPICAL_CREAM | CUTANEOUS | Status: DC | PRN

## 2018-05-16 MED ORDER — LIDOCAINE HCL (PF) 1 % IJ SOLN: 5.0000 mL | INTRAMUSCULAR | Status: DC | PRN

## 2018-05-16 MED ORDER — ALPRAZOLAM 0.5 MG PO TABS
1.0000 mg | ORAL_TABLET | Freq: Two times a day (BID) | ORAL | Status: DC
  Administered 2018-05-16 – 2018-05-19 (×6): 1 mg via ORAL
  Filled 2018-05-16 (×6): qty 2

## 2018-05-16 NOTE — Plan of Care (Signed)
  Problem: Activity: Goal: Ability to tolerate increased activity will improve Outcome: Progressing   Problem: Respiratory: Goal: Ability to maintain a clear airway and adequate ventilation will improve Outcome: Progressing   Problem: Role Relationship: Goal: Method of communication will improve Outcome: Progressing   Problem: Clinical Measurements: Goal: Will remain free from infection Outcome: Progressing Goal: Diagnostic test results will improve Outcome: Progressing Goal: Respiratory complications will improve Outcome: Progressing Goal: Cardiovascular complication will be avoided Outcome: Progressing   Problem: Nutrition: Goal: Adequate nutrition will be maintained Outcome: Progressing   Problem: Pain Managment: Goal: General experience of comfort will improve Outcome: Progressing   Problem: Safety: Goal: Ability to remain free from injury will improve Outcome: Progressing   Problem: Skin Integrity: Goal: Risk for impaired skin integrity will decrease Outcome: Progressing

## 2018-05-16 NOTE — Procedures (Signed)
Central Venous hemodialysis Catheter Insertion Procedure Note Misty Green [id number] 16-Jul-1970  Procedure: Insertion of Central Venous hemodialysis Catheter Indications: hemodialysis, frequent med administration  Procedure Details Consent: obtained from husband, next of kin, as patient encephalopathic Time Out: Verified patient identification, verified procedure, site/side was marked, verified correct patient position, special equipment/implants available, medications/allergies/relevent history reviewed, required imaging and test results available.  Performed  Maximum sterile technique was used including antiseptics, cap, gloves, gown, hand hygiene, mask and sheet. Skin prep: Chlorhexidine; local anesthetic administered A antimicrobial bonded/coated triple lumen catheter was placed in the right internal jugular vein using the Seldinger technique.  Evaluation Blood flow good Complications: No apparent complications Patient did tolerate procedure well. Chest X-ray ordered to verify placement.  CXR: pending.  Alphonzo Grieve 05/16/2018, 9:47 AM

## 2018-05-16 NOTE — Progress Notes (Signed)
Assisted tele visit to patient with husband.  Adam Phenix, RN

## 2018-05-16 NOTE — Progress Notes (Signed)
..   NAME:  Misty Green, MRN:  [id number], DOB:  [birth year], LOS: 4 ADMISSION DATE:  05/12/2018, CONSULTATION DATE:  05/13/2018 REFERRING MD:  ED PROVIDER, CHIEF COMPLAINT:  S/p cardiac arrest   Brief History   48 yr old F w/ PMHx COPD, CHF, chronic back pain from spinal stenoses, diverticulosis s/p colostomy presents s/p cardiac arrest. PCCM consulted for admission.  History of present illness   (History obtained from EMR, family and acct of other providers)  48 yr old F w/ PMHx sig for COPD, CHF, spinal stenoses, diverticulosis s/p colostomy  Her baseline is awake alert oriented x 4 fully functional with iADLS per her daughter she is not very compliant with her medications, she last saw her primary doctor almost a yr ago per her husband.  She uses a CPAP at night which she received 3L O2 through but does not require supplemental oxygen during awake and active periods.  Per her husband the patient started feeling ill ( symptoms included: cough, nasal congestion, occasional SOB) on 05/07/2018 her symptoms became worse on Wednesday 4/22  She complained of a headache accompanied by nausea and vomiting unable to tolerate oral intake. On Thursday 4/23 she continued to have the headache despite tylenol but was drinking fluids, and had a 1/2 can soup and some crackers.  The morning of 05/12/18 she complained of hot and cold flashes. She took tylenol throughout the week for these symptoms. [name] does not know how much she took.   When he found her unresponsive at 8 pm he called 911 immediately he states that he cleared the bed and laid her flat shortly after she began to vomit. He was instructed to turn her on her side which he did. He states that he believes it was  5 mins between the time 911 was called and when Fire arrived.   Downtime unknown: in the EMR it is listed as 13-14 min code but upon further investigation  per husband states that the pt had been "sleeping" all day and he does not have a  definitive time. He found her unresponsive at 8 pm but He last spoke to her at noon and she verbally responded at that time and went back to sleep.  Daughter states that she doesn't live with he mother but spoke to her via telephone throughout the week up until 4/23 in the evening and her mom denied any complaints to her. She states that her mother had a cardiac arrest back when she was pregnant with her brother and was diagnosed with peripartum cardiomyopathy. She is supposed to be on lasix which she doesn't take.  She presents on 4/25 to [facility] s/p cardiac arrest (initial rhythm unknown). King airway was placed by EMS and she was started on Epi gtt. Intubated in the ED received RSI at 2120. COVID negative off of vasopressors. PCCM consulted.  Past Medical History  .Marland Kitchen Active Ambulatory Problems    Diagnosis Date Noted  . Acute respiratory failure ([place]) 03/22/2016  . COPD exacerbation ([place]) 03/22/2016  . Chronic back pain 03/22/2016  . Opioid type dependence, abuse ([place]) 03/22/2016  . Polycythemia 03/22/2016  . Anxiety 03/22/2016  . OSA on CPAP 03/22/2016  . Diverticulitis 12/03/2017  . Closed fracture of distal end of right radius 08/25/2015  . Closed nondisplaced fracture of styloid process of right ulna 08/25/2015  . Mixed anxiety and depressive disorder 12/22/2017  . Obesity 12/22/2017  . Spinal stenosis of lumbar region 04/12/2016  . Tobacco user 12/22/2017  .  Acute diverticulitis 01/06/2018  . Diverticulitis of colon (without mention of hemorrhage)(562.11)    Resolved Ambulatory Problems    Diagnosis Date Noted  . No Resolved Ambulatory Problems   Past Medical History:  Diagnosis Date  . CHF (congestive heart failure) ([place])   . COPD (chronic obstructive pulmonary disease) ([place])   . Current smoker   . Opiate abuse, continuous ([place])    Significant Hospital Events   s/p cardiac arrest Endotracheally intubated on 05/12/2018  Consults:  PCCM 4/24 Neurology 4/25 Nephrology  4/26  Procedures:  4/24: ett  Significant Diagnostic Tests:  4/25: CT Head w/o contrast no acute abnormalities 4/25: EEG discontinuous background slowing representing medication effect 4/26: MRI brain neg for acute process  Micro Data:  4/24 Blood: ngtd 4/24 covid: NEG 4/24 RVP: neg 4/24 resp: H flu  Antimicrobials:  4/24 cefepime->  Interim history/subjective:  4/26: Required just one dose of nibmex last night for vent dyssynchrony. 4/27: moving spontaneously and with some purpose but not following commands, not tracking.  4/28: largely unchanged neuro exam, worsening AKI; will initiate HD  Objective   Blood pressure 124/82, pulse 65, temperature 98.1 F (36.7 C), temperature source Oral, resp. rate 18, height 5\' 1"  (1.549 m), weight 91.7 kg, SpO2 97 %.    Vent Mode: PRVC FiO2 (%):  [40 %] 40 % Set Rate:  [18 bmp] 18 bmp Vt Set:  [500 mL] 500 mL PEEP:  [8 cmH20] 8 cmH20 Pressure Support:  [10 cmH20] 10 cmH20 Plateau Pressure:  [15 cmH20-23 cmH20] 23 cmH20   Intake/Output Summary (Last 24 hours) at 05/16/2018 0759 Last data filed at 05/16/2018 9892 Gross per 24 hour  Intake 2509.99 ml  Output 25 ml  Net 2484.99 ml   Filed Weights   05/13/18 0256 05/14/18 0500 05/16/18 0411  Weight: 85.6 kg 86.7 kg 91.7 kg    Examination: General: ill appearing female, awake but not alert, tracking intermittently, moving spontaneously in bed on vent HEENT: No JVD, pupils are equal and reactive but sluggish Neuro: moves all 4 extremities spontaneously CV: RRR, no murmurs PULM: even/non-labored, no wheezing or rales GI: soft, non-tender, decreased bowel sounds Extremities: warm/dry, 1+ nonpitting edema  Skin: no rashes or lesions   Assessment & Plan:  S/p cardiac arrest: initial rhythm unknown presumed  PEA Not a candidate for TTM while it is documented as a 13-14 min code time there is an unknown amt of down time that is unaccounted for prior to EMS arriving. Neurological exam  on admission concerning for significant anoxic brain injury, however improving currently. - maintain normothermia - telemetry - as neurological status improves, consider echo  Septic shock vs Cardiogenic shock S/p cardiac arrest initial rhythm unknown Suspected asp pna, gn likely Pt has a h/o CHF per daughter and is non compliant w/ Lasix; last EF documented 60-65% in 1194 w/o diastolic dysfunction. Procalcitonin trending down; leukocytosis stable and she has remained afebrile. Trach aspirate culture growing H flu. - goal MAP >65 - continue cefepime - trend fever curve and leukocytosis - f/u blood cultures  Acute hypoxic and hypercapnic respiratory failure  Presumed aspiration event with aspiration pneumonia H/o COPD on Advair, Spiriva and Daliresp uses a cpap w/ oxygen at night In setting of cardiac arrest, possible aspiration event per family report. Yesterday had vent dyssynchrony which improved with bronchodilators, TV decrease and sedation. COVID and RVP negative on admission. Trach aspirate culture showing H flu, neg for beta lactamase. -Solu-Medrol 80 mg IV q12 -bronchodilators to every 4 -  cefepime -sedation with precedex as less effect on respiratory drive -f/u blood cultures  Acute encephalopathy s/p cardiac arrest Uremia Poor neurological exam reported several hours after RSI meds and before other sedatives started. CT head on admission negative. EEG revealed discontinuous generalized background slowing likely related to medication effect. MRI negative for anoxic brain injury or infarcts. Neuro exam has shown improvement, however patient still will not follow commands. At this time uremia is likely contributing as well. Neurology has signed off for now. -continue addressing AKI with uremia -continue treating possible aspiration pna   Elevated Transaminases In setting of cardiac arrest with unknown down time; likely represents shock liver. Trending down currently. -trend   Anuric Acute Kidney Injury - likely ATN in setting of arrest Baseline creat  0.70 ->6.03 this morning with BUN 133. She had <30cc uop in last 24 hours. Bicarb and electrolytes stable so far. RUS w/o retention or hydronephrosis. Nephrology on board - appreciate their recs. -will initiate dialysis today; place HD cath -continue maintaining adequate BP  Best practice:  Diet: TF Pain/Anxiety/Delirium protocol (if indicated): propofol, PRN nbx VAP protocol (if indicated): yes DVT prophylaxis: hep [place] and SCDs GI prophylaxis: Protonix Glucose control: if BG exceeds 180 mg/dl  Mobility: bedrest Code Status: Full code  Family Communication: updated 4/27 Disposition: ICU  Labs   CBC: Recent Labs  Lab 05/12/18 2135  05/13/18 1454 05/14/18 0652 05/14/18 1416 05/15/18 0229 05/16/18 0504  WBC 11.5*  --   --  12.8*  --  22.2*  23.8* 22.4*  NEUTROABS 8.3*  --   --  11.3*  --  18.9*  --   HGB 13.3   < > 12.9 11.5* 10.9* 11.1*  11.1* 11.3*  HCT 46.5*   < > 38.0 37.8 32.0* 35.8*  34.4* 34.2*  MCV 105.7*  --   --  97.4  --  95.5  95.0 91.9  PLT 294  --   --  214  --  217  203 203   < > = values in this interval not displayed.    Basic Metabolic Panel: Recent Labs  Lab 05/12/18 2135 05/12/18 2147  05/13/18 1454 05/14/18 0652 05/14/18 1416 05/15/18 0229 05/16/18 0504  NA 140 134*   < > 140 140 136 138 139  K 4.6 4.7   < > 3.4* 3.8 3.8 3.5 3.7  CL 89*  --   --   --  94*  --  96* 92*  CO2 32  --   --   --  31  --  26 28  GLUCOSE 107*  --   --   --  137*  --  143* 166*  BUN 14  --   --   --  51*  --  78* 133*  CREATININE 1.40* 1.20*  --   --  3.29*  --  4.25* 6.03*  CALCIUM 9.4  --   --   --  8.6*  --  8.2* 8.9  MG  --   --   --   --  2.6*  --   --  2.8*  PHOS  --   --   --   --  3.7  --   --  2.5   < > = values in this interval not displayed.   GFR: Estimated Creatinine Clearance: 11.8 mL/min (A) (by C-G formula based on SCr of 6.03 mg/dL (H)). Recent Labs  Lab 05/12/18  2135 05/13/18 0034 05/14/18 9622 05/15/18 0229 05/15/18 0816 05/15/18  3094 05/16/18 0504  PROCALCITON 0.68  --   --   --  9.04  --  7.36  WBC 11.5*  --  12.8* 22.2*  23.8*  --   --  22.4*  LATICACIDVEN 7.7* 3.1*  --   --   --  0.8  --     Liver Function Tests: Recent Labs  Lab 05/12/18 2135 05/15/18 0229 05/16/18 0504  AST 887* 1,441* 483*  ALT 924* 2,892* 2,195*  ALKPHOS 105 136* 122  BILITOT 0.9 1.0 0.7  PROT 7.4 6.3* 6.1*  ALBUMIN 3.0* 2.4* 2.6*   No results for input(s): LIPASE, AMYLASE in the last 168 hours. Recent Labs  Lab 05/14/18 1326  AMMONIA 55*    ABG    Component Value Date/Time   PHART 7.373 05/14/2018 1416   PCO2ART 58.2 (H) 05/14/2018 1416   PO2ART 78.0 (L) 05/14/2018 1416   HCO3 33.9 (H) 05/14/2018 1416   TCO2 36 (H) 05/14/2018 1416   O2SAT 95.0 05/14/2018 1416     Coagulation Profile: No results for input(s): INR, PROTIME in the last 168 hours.  Cardiac Enzymes: Recent Labs  Lab 05/14/18 1326  CKTOTAL 926*    HbA1C: No results found for: HGBA1C  CBG: Recent Labs  Lab 05/15/18 1601 05/15/18 1940 05/15/18 2330 05/16/18 0334 05/16/18 0729  GLUCAP 110* 183* 169* 170* 156*   Alphonzo Grieve, MD PGY3 Pager [phone]

## 2018-05-16 NOTE — Plan of Care (Signed)
Hemodialysis catheter placed by PCCM and confirmed with CXR.  Hemodialysis called and updated to have patient put on their schedule.

## 2018-05-16 NOTE — Progress Notes (Signed)
Family used Elink to have video chat with patient

## 2018-05-16 NOTE — Progress Notes (Signed)
.  Assisted tele visit to patient with husband.  Corene Cornea, RN

## 2018-05-16 NOTE — Progress Notes (Signed)
Admit: 05/12/2018 LOS: 4  1F s/p cardiac arrest of unclear duration/ VDRF, HIE, and anuric AKI from normal baseline SCr.  Subjective:  . Sig inc in SCr and BUn, K 3.7 and HCO3 28 . BP stable . Remains anuric . 40% FIO2 . Cont to follow commands  04/27 0701 - 04/28 0700 In: 2510 [I.V.:796.1; NG/GT:1612.3; IV Piggyback:101.5] Out: 25 [Urine:25]  Filed Weights   05/13/18 0256 05/14/18 0500 05/16/18 0411  Weight: 85.6 kg 86.7 kg 91.7 kg    Scheduled Meds: . budesonide (PULMICORT) nebulizer solution  0.25 mg Nebulization BID  . chlorhexidine gluconate (MEDLINE KIT)  15 mL Mouth Rinse BID  . Chlorhexidine Gluconate Cloth  6 each Topical Daily  . heparin  5,000 Units Subcutaneous Q8H  . insulin aspart  0-15 Units Subcutaneous Q4H  . ipratropium-albuterol  3 mL Nebulization Q6H  . mouth rinse  15 mL Mouth Rinse 10 times per day  . methylPREDNISolone (SOLU-MEDROL) injection  40 mg Intravenous Q12H  . pantoprazole sodium  40 mg Per Tube Q1200  . pneumococcal 23 valent vaccine  0.5 mL Intramuscular Tomorrow-1000   Continuous Infusions: . ceFEPime (MAXIPIME) IV 1 g (05/16/18 0734)  . dexmedetomidine (PRECEDEX) IV infusion 1 mcg/kg/hr (05/16/18 0700)  . feeding supplement (VITAL HIGH PROTEIN) 1,000 mL (05/15/18 1346)  . fentaNYL infusion INTRAVENOUS 175 mcg/hr (05/16/18 0700)   PRN Meds:.albuterol, fentaNYL (SUBLIMAZE) injection, sodium chloride flush  Current Labs: reviewed    Physical Exam:  Blood pressure 124/82, pulse 65, temperature 98.1 F (36.7 C), temperature source Oral, resp. rate 18, height 5' 1"  (1.549 m), weight 91.7 kg, SpO2 97 %. GEN: intubated, sedated ENT: NCAT, oral ETT in place EYES: eyes closed CV: borerline bradycardic, regular, nl s1s2 no rub PULM: ctab with coarse ant bs ABD: s/nt/nd; LLQ ostomy SKIN: no sig rashes EXT:no sig LEE  A 1. Anuric AKI, normal baseline; ischemic ATN; K and HCO3 ok.  Weights up 3-4lb and 4.5L positive from admission;  subnephrotic proteinuria; start HD 4/28 2. Cardiac arrest with resultant HIE, CCM and Neuro following; seems to be improving 3. COPD; on steroids 4. Leukocytosis, on steroids 5. Inc LFts, likely shock related; per CCM; improving 6. Longstanding colostomy 7. ? PNA, cefepime  P . Start HD today, iHD: 3K, 2h, Qb 250 Qd 500, 1L UF, no heparin . Medication Issues; o Preferred narcotic agents for pain control are hydromorphone, fentanyl, and methadone. Morphine should not be used.  o Baclofen should be avoided o Avoid oral sodium phosphate and magnesium citrate based laxatives / bowel preps    Pearson Grippe MD 05/16/2018, 8:07 AM  Recent Labs  Lab 05/14/18 0652 05/14/18 1416 05/15/18 0229 05/16/18 0504  NA 140 136 138 139  K 3.8 3.8 3.5 3.7  CL 94*  --  96* 92*  CO2 31  --  26 28  GLUCOSE 137*  --  143* 166*  BUN 51*  --  78* 133*  CREATININE 3.29*  --  4.25* 6.03*  CALCIUM 8.6*  --  8.2* 8.9  PHOS 3.7  --   --  2.5   Recent Labs  Lab 05/12/18 2135  05/14/18 0652 05/14/18 1416 05/15/18 0229 05/16/18 0504  WBC 11.5*  --  12.8*  --  22.2*  23.8* 22.4*  NEUTROABS 8.3*  --  11.3*  --  18.9*  --   HGB 13.3   < > 11.5* 10.9* 11.1*  11.1* 11.3*  HCT 46.5*   < > 37.8 32.0* 35.8*  34.4*  34.2*  MCV 105.7*  --  97.4  --  95.5  95.0 91.9  PLT 294  --  214  --  217  203 203   < > = values in this interval not displayed.

## 2018-05-17 LAB — COMPREHENSIVE METABOLIC PANEL
ALT: 1461 U/L — ABNORMAL HIGH (ref 0–44)
AST: 178 U/L — ABNORMAL HIGH (ref 15–41)
Albumin: 2.4 g/dL — ABNORMAL LOW (ref 3.5–5.0)
Alkaline Phosphatase: 100 U/L (ref 38–126)
Anion gap: 15 (ref 5–15)
BUN: 106 mg/dL — ABNORMAL HIGH (ref 6–20)
CO2: 28 mmol/L (ref 22–32)
Calcium: 8.3 mg/dL — ABNORMAL LOW (ref 8.9–10.3)
Chloride: 95 mmol/L — ABNORMAL LOW (ref 98–111)
Creatinine, Ser: 5.07 mg/dL — ABNORMAL HIGH (ref 0.44–1.00)
GFR calc Af Amer: 11 mL/min — ABNORMAL LOW (ref >60)
GFR calc non Af Amer: 9 mL/min — ABNORMAL LOW (ref >60)
Glucose, Bld: 142 mg/dL — ABNORMAL HIGH (ref 70–99)
Potassium: 4.5 mmol/L (ref 3.5–5.1)
Sodium: 138 mmol/L (ref 135–145)
Total Bilirubin: 0.4 mg/dL (ref 0.3–1.2)
Total Protein: 5.9 g/dL — ABNORMAL LOW (ref 6.5–8.1)

## 2018-05-17 LAB — HEPATITIS B SURFACE ANTIGEN: Hepatitis B Surface Ag: NEGATIVE

## 2018-05-17 LAB — GLUCOSE, CAPILLARY
Glucose-Capillary: 134 mg/dL — ABNORMAL HIGH (ref 70–99)
Glucose-Capillary: 114 mg/dL — ABNORMAL HIGH (ref 70–99)
Glucose-Capillary: 153 mg/dL — ABNORMAL HIGH (ref 70–99)
Glucose-Capillary: 94 mg/dL (ref 70–99)
Glucose-Capillary: 109 mg/dL — ABNORMAL HIGH (ref 70–99)
Glucose-Capillary: 143 mg/dL — ABNORMAL HIGH (ref 70–99)

## 2018-05-17 LAB — CULTURE, BLOOD (ROUTINE X 2)
Culture: NO GROWTH
Culture: NO GROWTH
Special Requests: ADEQUATE

## 2018-05-17 LAB — PHOSPHORUS: Phosphorus: 3.7 mg/dL (ref 2.5–4.6)

## 2018-05-17 LAB — CBC
HCT: 35 % — ABNORMAL LOW (ref 36.0–46.0)
Hemoglobin: 11.1 g/dL — ABNORMAL LOW (ref 12.0–15.0)
MCH: 29.8 pg (ref 26.0–34.0)
MCHC: 31.7 g/dL (ref 30.0–36.0)
MCV: 93.8 fL (ref 80.0–100.0)
Platelets: 164 10*3/uL (ref 150–400)
RBC: 3.73 MIL/uL — ABNORMAL LOW (ref 3.87–5.11)
RDW: 13.4 % (ref 11.5–15.5)
WBC: 17 10*3/uL — ABNORMAL HIGH (ref 4.0–10.5)
nRBC: 0.7 % — ABNORMAL HIGH (ref 0.0–0.2)

## 2018-05-17 LAB — MAGNESIUM: Magnesium: 2.3 mg/dL (ref 1.7–2.4)

## 2018-05-17 LAB — HEPATITIS B SURFACE ANTIBODY, QUANTITATIVE: Hepatitis B-Post: <3.1 m[IU]/mL — ABNORMAL LOW (ref >9.9)

## 2018-05-17 LAB — AMMONIA: Ammonia: 37 umol/L — ABNORMAL HIGH (ref 9–35)

## 2018-05-17 MED ORDER — CHLORHEXIDINE GLUCONATE 0.12 % MT SOLN
OROMUCOSAL | Status: AC
  Filled 2018-05-17: qty 15

## 2018-05-17 MED ORDER — LACTULOSE 10 GM/15ML PO SOLN
20.0000 g | Freq: Two times a day (BID) | ORAL | Status: AC
  Administered 2018-05-17 – 2018-05-18 (×3): 20 g via ORAL
  Filled 2018-05-17 (×3): qty 30

## 2018-05-17 NOTE — Progress Notes (Signed)
..   NAME:  Misty Green, MRN:  [id number], DOB:  September 26, 1970, LOS: 5 ADMISSION DATE:  05/12/2018, CONSULTATION DATE:  05/13/2018 REFERRING MD:  ED PROVIDER, CHIEF COMPLAINT:  S/p cardiac arrest   Brief History   48 yr old F w/ PMHx COPD, CHF, chronic back pain from spinal stenoses, diverticulosis s/p colostomy presents s/p cardiac arrest. PCCM consulted for admission.  History of present illness   (History obtained from EMR, family and acct of other providers)  48 yr old F w/ PMHx sig for COPD, CHF, spinal stenoses, diverticulosis s/p colostomy  Her baseline is awake alert oriented x 4 fully functional with iADLS per her daughter she is not very compliant with her medications, she last saw her primary doctor almost a yr ago per her husband.  She uses a CPAP at night which she received 3L O2 through but does not require supplemental oxygen during awake and active periods.  Per her husband the patient started feeling ill ( symptoms included: cough, nasal congestion, occasional SOB) on 05/07/2018 her symptoms became worse on Wednesday 4/22  She complained of a headache accompanied by nausea and vomiting unable to tolerate oral intake. On Thursday 4/23 she continued to have the headache despite tylenol but was drinking fluids, and had a 1/2 can soup and some crackers.  The morning of 05/12/18 she complained of hot and cold flashes. She took tylenol throughout the week for these symptoms. [name] does not know how much she took.   When he found her unresponsive at 8 pm he called 911 immediately he states that he cleared the bed and laid her flat shortly after she began to vomit. He was instructed to turn her on her side which he did. He states that he believes it was  5 mins between the time 911 was called and when Fire arrived.   Downtime unknown: in the EMR it is listed as 13-14 min code but upon further investigation  per husband states that the pt had been "sleeping" all day and he does not have a  definitive time. He found her unresponsive at 8 pm but He last spoke to her at noon and she verbally responded at that time and went back to sleep.  Daughter states that she doesn't live with he mother but spoke to her via telephone throughout the week up until 4/23 in the evening and her mom denied any complaints to her. She states that her mother had a cardiac arrest back when she was pregnant with her brother and was diagnosed with peripartum cardiomyopathy. She is supposed to be on lasix which she doesn't take.  She presents on 4/[facility] to [facility] s/p cardiac arrest (initial rhythm unknown). King airway was placed by EMS and she was started on Epi gtt. Intubated in the ED received RSI at 2120. COVID negative off of vasopressors. PCCM consulted.  Past Medical History  .Marland Kitchen Active Ambulatory Problems    Diagnosis Date Noted  . Acute respiratory failure ([place]) 03/22/2016  . COPD exacerbation ([place]) 03/22/2016  . Chronic back pain 03/22/2016  . Opioid type dependence, abuse ([place]) 03/22/2016  . Polycythemia 03/22/2016  . Anxiety 03/22/2016  . OSA on CPAP 03/22/2016  . Diverticulitis 12/03/2017  . Closed fracture of distal end of right radius 08/25/2015  . Closed nondisplaced fracture of styloid process of right ulna 08/25/2015  . Mixed anxiety and depressive disorder 12/22/2017  . Obesity 12/22/2017  . Spinal stenosis of lumbar region 04/12/2016  . Tobacco user 12/22/2017  .  Acute diverticulitis 01/06/2018  . Diverticulitis of colon (without mention of hemorrhage)(562.11)    Resolved Ambulatory Problems    Diagnosis Date Noted  . No Resolved Ambulatory Problems   Past Medical History:  Diagnosis Date  . CHF (congestive heart failure) ([place])   . COPD (chronic obstructive pulmonary disease) ([place])   . Current smoker   . Opiate abuse, continuous ([place])    Significant Hospital Events   s/p cardiac arrest Endotracheally intubated on 05/12/2018 HD started 4/28  Consults:  PCCM 4/24 Neurology  4/25 Nephrology 4/26  Procedures:  4/24: ett 4/28: RIJ HD cath  Significant Diagnostic Tests:  4/25: CT Head w/o contrast no acute abnormalities 4/25: EEG discontinuous background slowing representing medication effect 4/26: MRI brain neg for acute process  Micro Data:  4/24 Blood: ngtd 4/24 covid: NEG 4/24 RVP: neg 4/24 resp: H flu  Antimicrobials:  4/24 cefepime->4/29  Interim history/subjective:  4/26: Required just one dose of nibmex last night for vent dyssynchrony. 4/27: moving spontaneously and with some purpose but not following commands, not tracking.  4/28: largely unchanged neuro exam, worsening AKI; will initiate HD 4/29: following simple commands, attempting to wean sedation  Objective   Blood pressure 107/70, pulse 73, temperature 99.1 F (37.3 C), temperature source Axillary, resp. rate 16, height 5\' 1"  (1.549 m), weight 89.8 kg, SpO2 98 %.    Vent Mode: PRVC FiO2 (%):  [40 %] 40 % Set Rate:  [18 bmp] 18 bmp Vt Set:  [500 mL] 500 mL PEEP:  [8 cmH20] 8 cmH20 Plateau Pressure:  [16 cmH20-27 cmH20] 16 cmH20   Intake/Output Summary (Last 24 hours) at 05/17/2018 0956 Last data filed at 05/17/2018 0900 Gross per 24 hour  Intake 2662.28 ml  Output 500 ml  Net 2162.28 ml   Filed Weights   05/16/18 1200 05/16/18 1620 05/17/18 0500  Weight: 92 kg 91 kg 89.8 kg    Examination: General: ill appearing female, awake, tracking intermittently, moving spontaneously in bed on vent HEENT: No JVD, pupils are equal and reactive but sluggish Neuro: moves all 4 extremities spontaneously, follows simple commands CV: RRR, no murmurs PULM: even/non-labored, no wheezing or rales GI: soft, non-tender, decreased bowel sounds Extremities: warm/dry, 1+ nonpitting edema  Skin: no rashes or lesions   Assessment & Plan:  S/p cardiac arrest: initial rhythm unknown presumed  PEA Not a candidate for TTM while it is documented as a 13-14 min code time there is an unknown amt of  down time that is unaccounted for prior to EMS arriving. Neurological exam on admission concerning for significant anoxic brain injury, however improving currently. Etiology thought to be respiratory from COPD exacerbation - maintain normothermia - telemetry - as neurological status and HD status improves, will plan to get echo in the coming days  Septic shock vs Cardiogenic shock S/p cardiac arrest initial rhythm unknown Suspected asp pna, gn likely Pt has a h/o CHF per daughter and is non compliant w/ Lasix; last EF documented 60-65% in 6270 w/o diastolic dysfunction. Procalcitonin trending down; leukocytosis stable and she has remained afebrile. Trach aspirate culture growing H flu - completed treatment. - goal MAP >65 - trend fever curve and leukocytosis - f/u blood cultures  Acute hypoxic and hypercapnic respiratory failure  Presumed aspiration event pneumonia, no CXR infiltrate H/o COPD on Advair, Spiriva and Daliresp uses a cpap w/ oxygen at night COPD exacerbation In setting of cardiac arrest, possible aspiration event per family report. Yesterday had vent dyssynchrony which improved with bronchodilators,  TV decrease and sedation. COVID and RVP negative on admission. Trach aspirate culture showing H flu, neg for beta lactamase; she has completed 5d of appropriate antibiotic coverage with cefepime. -Solu-Medrol 40 mg IV q12, continue weaning every couple of days -bronchodilators to every 4 -sedation with precedex as less effect on respiratory drive, wean sedation as able to attempt PSW trials  Acute encephalopathy s/p cardiac arrest Uremia Poor neurological exam reported several hours after RSI meds and before other sedatives started. CT head on admission negative. EEG revealed discontinuous generalized background slowing likely related to medication effect. MRI negative for anoxic brain injury or infarcts. Neuro exam has shown improvement with intermittently following commands. At  this time uremia is likely contributing as well. Neurology has signed off for now. -continue addressing AKI with uremia -wean sedation as able -completed treatment for H flu   Elevated Transaminases In setting of cardiac arrest with unknown down time; likely represents shock liver. Trending down currently. -trend  Anuric Acute Kidney Injury - likely ATN in setting of arrest Baseline creat  0.70 ->6.03 this morning with BUN 100. Bicarb and electrolytes stable. RUS w/o retention or hydronephrosis. Nephrology on board; started [facility] 4/28 with TThS schedule. -continue maintaining adequate BP -iHD per nephrology  Best practice:  Diet: TF Pain/Anxiety/Delirium protocol (if indicated): precedex, fentanyl VAP protocol (if indicated): yes DVT prophylaxis: hep Stella and SCDs GI prophylaxis: Protonix Glucose control: if BG exceeds 180 mg/dl  Mobility: bedrest Code Status: Full code  Family Communication: updated 4/28 Disposition: ICU  Labs   CBC: Recent Labs  Lab 05/12/18 2135  05/14/18 0652 05/14/18 1416 05/15/18 0229 05/16/18 0504 05/17/18 0538  WBC 11.5*  --  12.8*  --  22.2*  23.8* 22.4* 17.0*  NEUTROABS 8.3*  --  11.3*  --  18.9*  --   --   HGB 13.3   < > 11.5* 10.9* 11.1*  11.1* 11.3* 11.1*  HCT 46.5*   < > 37.8 32.0* 35.8*  34.4* 34.2* 35.0*  MCV 105.7*  --  97.4  --  95.5  95.0 91.9 93.8  PLT 294  --  214  --  217  203 203 164   < > = values in this interval not displayed.    Basic Metabolic Panel: Recent Labs  Lab 05/12/18 2135 05/12/18 2147  05/14/18 0652 05/14/18 1416 05/15/18 0229 05/16/18 0504 05/17/18 0538  NA 140 134*   < > 140 136 138 139 138  K 4.6 4.7   < > 3.8 3.8 3.5 3.7 4.5  CL 89*  --   --  94*  --  96* 92* 95*  CO2 32  --   --  31  --  26 28 28   GLUCOSE 107*  --   --  137*  --  143* 166* 142*  BUN 14  --   --  51*  --  78* 133* 106*  CREATININE 1.40* 1.20*  --  3.29*  --  4.25* 6.03* 5.07*  CALCIUM 9.4  --   --  8.6*  --  8.2* 8.9 8.3*  MG   --   --   --  2.6*  --   --  2.8* 2.3  PHOS  --   --   --  3.7  --   --  2.5 3.7   < > = values in this interval not displayed.   GFR: Estimated Creatinine Clearance: 13.8 mL/min (A) (by C-G formula based on SCr of 5.07 mg/dL (H)).  Recent Labs  Lab 05/12/18 2135 05/13/18 0034 05/14/18 8182 05/15/18 0229 05/15/18 0816 05/15/18 0952 05/16/18 0504 05/17/18 0538  PROCALCITON 0.68  --   --   --  9.04  --  7.36  --   WBC 11.5*  --  12.8* 22.2*  23.8*  --   --  22.4* 17.0*  LATICACIDVEN 7.7* 3.1*  --   --   --  0.8  --   --     Liver Function Tests: Recent Labs  Lab 05/12/18 2135 05/15/18 0229 05/16/18 0504 05/17/18 0538  AST 887* 1,441* 483* 178*  ALT 924* 2,892* 2,195* 1,461*  ALKPHOS 105 136* 122 100  BILITOT 0.9 1.0 0.7 0.4  PROT 7.4 6.3* 6.1* 5.9*  ALBUMIN 3.0* 2.4* 2.6* 2.4*   No results for input(s): LIPASE, AMYLASE in the last 168 hours. Recent Labs  Lab 05/14/18 1326 05/17/18 0700  AMMONIA 55* 37*    ABG    Component Value Date/Time   PHART 7.373 05/14/2018 1416   PCO2ART 58.2 (H) 05/14/2018 1416   PO2ART 78.0 (L) 05/14/2018 1416   HCO3 33.9 (H) 05/14/2018 1416   TCO2 36 (H) 05/14/2018 1416   O2SAT 95.0 05/14/2018 1416     Coagulation Profile: No results for input(s): INR, PROTIME in the last 168 hours.  Cardiac Enzymes: Recent Labs  Lab 05/14/18 1326  CKTOTAL 926*    HbA1C: No results found for: HGBA1C  CBG: Recent Labs  Lab 05/16/18 1916 05/16/18 2004 05/16/18 2355 05/17/18 0333 05/17/18 0736  GLUCAP 139* 149* 154* 134* 114*   Alphonzo Grieve, MD PGY3 Pager [phone]

## 2018-05-17 NOTE — Progress Notes (Signed)
[facility] Progress Note Patient Name: MARQUITE ATTWOOD DOB: 1970-09-16 MRN: [id number]   Date of Service  05/17/2018  HPI/Events of Note  Needs restraint order renewal  eICU Interventions  Restraints renewed        Frederik Pear 05/17/2018, 7:54 PM

## 2018-05-17 NOTE — Progress Notes (Signed)
[facility] Progress Note Patient Name: Misty Green DOB: July 20, 1970 MRN: [id number]   Date of Service  05/17/2018  HPI/Events of Note  Agitation - Request to renew bilateral soft wrist restraints.   eICU Interventions  Will renew bilateral soft wrist restraints.      Intervention Category Major Interventions: Other:  Sommer,Steven Cornelia Copa 05/17/2018, 1:15 AM

## 2018-05-17 NOTE — Progress Notes (Signed)
Assisted tele visit to patient with husband.  Adam Phenix, RN

## 2018-05-17 NOTE — Progress Notes (Signed)
Assisted tele visit to patient with family member.  Mariann Palo Anderson, RN   

## 2018-05-17 NOTE — Progress Notes (Signed)
Assisted tele visit to patient with husband.  Izsak Meir, Aliene Beams, RN

## 2018-05-17 NOTE — Progress Notes (Signed)
Admit: 05/12/2018 LOS: 5  57F s/p cardiac arrest of unclear duration/ VDRF, HIE, and anuric AKI from normal baseline SCr.  Subjective:  . HD#1 yesterday: 0.5L UF . K 4.5 this AM, HCO3 28; LFTs cont to downtrend . No reported UOP, foley removed . Intermittently following commands  04/28 0701 - 04/29 0700 In: 1668.3 [P.O.:50; I.V.:963.8; NG/GT:575.8; IV Piggyback:78.7] Out: 500   Filed Weights   05/16/18 1200 05/16/18 1620 05/17/18 0500  Weight: 92 kg 91 kg 89.8 kg    Scheduled Meds: . ALPRAZolam  1 mg Oral BID  . budesonide (PULMICORT) nebulizer solution  0.25 mg Nebulization BID  . chlorhexidine gluconate (MEDLINE KIT)  15 mL Mouth Rinse BID  . Chlorhexidine Gluconate Cloth  6 each Topical Daily  . Chlorhexidine Gluconate Cloth  6 each Topical Q0600  . heparin  5,000 Units Subcutaneous Q8H  . insulin aspart  0-15 Units Subcutaneous Q4H  . ipratropium-albuterol  3 mL Nebulization Q6H  . mouth rinse  15 mL Mouth Rinse 10 times per day  . methylPREDNISolone (SOLU-MEDROL) injection  40 mg Intravenous Q12H  . pantoprazole sodium  40 mg Per Tube Q1200   Continuous Infusions: . sodium chloride    . sodium chloride    . ceFEPime (MAXIPIME) IV 200 mL/hr at 05/17/18 0800  . dexmedetomidine (PRECEDEX) IV infusion 1 mcg/kg/hr (05/17/18 0800)  . feeding supplement (VITAL HIGH PROTEIN) 50 mL/hr at 05/17/18 0600  . fentaNYL infusion INTRAVENOUS 100 mcg/hr (05/17/18 0800)   PRN Meds:.sodium chloride, sodium chloride, albuterol, alteplase, fentaNYL (SUBLIMAZE) injection, heparin, lidocaine (PF), lidocaine-prilocaine, pentafluoroprop-tetrafluoroeth, sodium chloride flush  Current Labs: reviewed    Physical Exam:  Blood pressure (!) 153/98, pulse 71, temperature 99.1 F (37.3 C), temperature source Axillary, resp. rate 18, height 5' 1"  (1.549 m), weight 89.8 kg, SpO2 99 %. GEN: intubated, sedated ENT: NCAT, oral ETT in place EYES: eyes closed CV: borerline bradycardic, regular, nl s1s2  no rub PULM: ctab with coarse ant bs ABD: s/nt/nd; LLQ ostomy SKIN: no sig rashes EXT:no sig LEE NEURO: intermittently followscommands  A 1. Anuric AKI, normal baseline; ischemic ATN; K and HCO3 ok.  neg renal US 4/27; subnephrotic proteinuria; start HD 4/28 2. Cardiac arrest with resultant HIE, CCM and Neuro following; seems to be improving 3. COPD; on steroids 4. Leukocytosis, on steroids improving 5. Inc LFts, likely shock related; per CCM; improving 6. Longstanding colostomy 7. ? PNA, cefepime  P . Cont HD THS schedule, iHD: 2K, 2.5h, Qb 300 Qd 500, 1.5L UF, tight heparin . Medication Issues; o Preferred narcotic agents for pain control are hydromorphone, fentanyl, and methadone. Morphine should not be used.  o Baclofen should be avoided o Avoid oral sodium phosphate and magnesium citrate based laxatives / bowel preps    Pearson Grippe MD 05/17/2018, 8:14 AM  Recent Labs  Lab 05/14/18 0488  05/15/18 0229 05/16/18 0504 05/17/18 0538  NA 140   < > 138 139 138  K 3.8   < > 3.5 3.7 4.5  CL 94*  --  96* 92* 95*  CO2 31  --  26 28 28   GLUCOSE 137*  --  143* 166* 142*  BUN 51*  --  78* 133* 106*  CREATININE 3.29*  --  4.25* 6.03* 5.07*  CALCIUM 8.6*  --  8.2* 8.9 8.3*  PHOS 3.7  --   --  2.5 3.7   < > = values in this interval not displayed.   Recent Labs  Lab 05/12/18 2135  05/14/18 3979  05/15/18 0229 05/16/18 0504 05/17/18 0538  WBC 11.5*  --  12.8*  --  22.2*  23.8* 22.4* 17.0*  NEUTROABS 8.3*  --  11.3*  --  18.9*  --   --   HGB 13.3   < > 11.5*   < > 11.1*  11.1* 11.3* 11.1*  HCT 46.5*   < > 37.8   < > 35.8*  34.4* 34.2* 35.0*  MCV 105.7*  --  97.4  --  95.5  95.0 91.9 93.8  PLT 294  --  214  --  217  203 203 164   < > = values in this interval not displayed.

## 2018-05-17 NOTE — Progress Notes (Signed)
Assisted tele visit to patient with son.  Adam Phenix, RN

## 2018-05-18 ENCOUNTER — Inpatient Hospital Stay (HOSPITAL_COMMUNITY): Payer: 59

## 2018-05-18 DIAGNOSIS — I5023 Acute on chronic systolic (congestive) heart failure: Secondary | ICD-10-CM

## 2018-05-18 LAB — GLUCOSE, CAPILLARY
Glucose-Capillary: 107 mg/dL — ABNORMAL HIGH (ref 70–99)
Glucose-Capillary: 115 mg/dL — ABNORMAL HIGH (ref 70–99)
Glucose-Capillary: 124 mg/dL — ABNORMAL HIGH (ref 70–99)
Glucose-Capillary: 144 mg/dL — ABNORMAL HIGH (ref 70–99)
Glucose-Capillary: 88 mg/dL (ref 70–99)
Glucose-Capillary: 92 mg/dL (ref 70–99)

## 2018-05-18 LAB — POCT I-STAT 7, (LYTES, BLD GAS, ICA,H+H)
Acid-base deficit: 2 mmol/L (ref 0.0–2.0)
Bicarbonate: 26.6 mmol/L (ref 20.0–28.0)
Bicarbonate: 25.5 mmol/L (ref 20.0–28.0)
Calcium, Ion: 1.2 mmol/L (ref 1.15–1.40)
Calcium, Ion: 1.17 mmol/L (ref 1.15–1.40)
HCT: 31 % — ABNORMAL LOW (ref 36.0–46.0)
HCT: 30 % — ABNORMAL LOW (ref 36.0–46.0)
Hemoglobin: 10.5 g/dL — ABNORMAL LOW (ref 12.0–15.0)
Hemoglobin: 10.2 g/dL — ABNORMAL LOW (ref 12.0–15.0)
O2 Saturation: 92 %
O2 Saturation: 99 %
Patient temperature: 97.5
Patient temperature: 97.8
Potassium: 4.4 mmol/L (ref 3.5–5.1)
Potassium: 6 mmol/L — ABNORMAL HIGH (ref 3.5–5.1)
Sodium: 136 mmol/L (ref 135–145)
Sodium: 134 mmol/L — ABNORMAL LOW (ref 135–145)
TCO2: 28 mmol/L (ref 22–32)
TCO2: 27 mmol/L (ref 22–32)
pCO2 arterial: 53.2 mmHg — ABNORMAL HIGH (ref 32.0–48.0)
pCO2 arterial: 52.9 mmHg — ABNORMAL HIGH (ref 32.0–48.0)
pH, Arterial: 7.304 — ABNORMAL LOW (ref 7.350–7.450)
pH, Arterial: 7.288 — ABNORMAL LOW (ref 7.350–7.450)
pO2, Arterial: 70 mmHg — ABNORMAL LOW (ref 83.0–108.0)
pO2, Arterial: 143 mmHg — ABNORMAL HIGH (ref 83.0–108.0)

## 2018-05-18 LAB — COMPREHENSIVE METABOLIC PANEL
ALT: 1017 U/L — ABNORMAL HIGH (ref 0–44)
AST: 89 U/L — ABNORMAL HIGH (ref 15–41)
Albumin: 2.3 g/dL — ABNORMAL LOW (ref 3.5–5.0)
Alkaline Phosphatase: 88 U/L (ref 38–126)
Anion gap: 13 (ref 5–15)
BUN: 141 mg/dL — ABNORMAL HIGH (ref 6–20)
CO2: 26 mmol/L (ref 22–32)
Calcium: 8.6 mg/dL — ABNORMAL LOW (ref 8.9–10.3)
Chloride: 97 mmol/L — ABNORMAL LOW (ref 98–111)
Creatinine, Ser: 6.05 mg/dL — ABNORMAL HIGH (ref 0.44–1.00)
GFR calc Af Amer: 9 mL/min — ABNORMAL LOW (ref >60)
GFR calc non Af Amer: 8 mL/min — ABNORMAL LOW (ref >60)
Glucose, Bld: 144 mg/dL — ABNORMAL HIGH (ref 70–99)
Potassium: 5 mmol/L (ref 3.5–5.1)
Sodium: 136 mmol/L (ref 135–145)
Total Bilirubin: 0.5 mg/dL (ref 0.3–1.2)
Total Protein: 5.7 g/dL — ABNORMAL LOW (ref 6.5–8.1)

## 2018-05-18 LAB — BLOOD GAS, ARTERIAL
Acid-base deficit: 1.4 mmol/L (ref 0.0–2.0)
Bicarbonate: 24.7 mmol/L (ref 20.0–28.0)
Delivery systems: POSITIVE
Drawn by: 347191
Expiratory PAP: 5
FIO2: 50
Inspiratory PAP: 12
O2 Saturation: 98.1 %
Patient temperature: 97.8
pCO2 arterial: 55.5 mmHg — ABNORMAL HIGH (ref 32.0–48.0)
pH, Arterial: 7.268 — ABNORMAL LOW (ref 7.350–7.450)
pO2, Arterial: 144 mmHg — ABNORMAL HIGH (ref 83.0–108.0)

## 2018-05-18 LAB — MAGNESIUM: Magnesium: 2.5 mg/dL — ABNORMAL HIGH (ref 1.7–2.4)

## 2018-05-18 LAB — CBC
HCT: 34.5 % — ABNORMAL LOW (ref 36.0–46.0)
Hemoglobin: 11.1 g/dL — ABNORMAL LOW (ref 12.0–15.0)
MCH: 29.8 pg (ref 26.0–34.0)
MCHC: 32.2 g/dL (ref 30.0–36.0)
MCV: 92.7 fL (ref 80.0–100.0)
Platelets: 145 10*3/uL — ABNORMAL LOW (ref 150–400)
RBC: 3.72 MIL/uL — ABNORMAL LOW (ref 3.87–5.11)
RDW: 13.6 % (ref 11.5–15.5)
WBC: 19.4 10*3/uL — ABNORMAL HIGH (ref 4.0–10.5)
nRBC: 0.3 % — ABNORMAL HIGH (ref 0.0–0.2)

## 2018-05-18 LAB — ECHOCARDIOGRAM COMPLETE
Height: 61 in
Weight: 3209.9 oz

## 2018-05-18 LAB — PHOSPHORUS: Phosphorus: 4.7 mg/dL — ABNORMAL HIGH (ref 2.5–4.6)

## 2018-05-18 MED ORDER — METHYLPREDNISOLONE SODIUM SUCC 40 MG IJ SOLR
40.0000 mg | Freq: Four times a day (QID) | INTRAMUSCULAR | Status: DC
  Administered 2018-05-18 – 2018-05-19 (×4): 40 mg via INTRAVENOUS
  Filled 2018-05-18 (×4): qty 1

## 2018-05-18 MED ORDER — HEPARIN SODIUM (PORCINE) 1000 UNIT/ML DIALYSIS: 20.0000 [IU]/kg | INTRAMUSCULAR | Status: DC | PRN

## 2018-05-18 MED ORDER — ORAL CARE MOUTH RINSE
15.0000 mL | Freq: Two times a day (BID) | OROMUCOSAL | Status: DC
  Administered 2018-05-18 – 2018-05-27 (×16): 15 mL via OROMUCOSAL

## 2018-05-18 NOTE — Progress Notes (Signed)
..   NAME:  Misty Green, MRN:  [id number], DOB:  1970/06/02, LOS: 6 ADMISSION DATE:  05/12/2018, CONSULTATION DATE:  05/13/2018 REFERRING MD:  ED PROVIDER, CHIEF COMPLAINT:  S/p cardiac arrest   Brief History   48 yr old F w/ PMHx COPD, CHF, chronic back pain from spinal stenoses, diverticulosis s/p colostomy presents s/p cardiac arrest. PCCM consulted for admission.  History of present illness   (History obtained from EMR, family and acct of other providers)  48 yr old F w/ PMHx sig for COPD, CHF, spinal stenoses, diverticulosis s/p colostomy  Her baseline is awake alert oriented x 4 fully functional with iADLS per her daughter she is not very compliant with her medications, she last saw her primary doctor almost a yr ago per her husband.  She uses a CPAP at night which she received 3L O2 through but does not require supplemental oxygen during awake and active periods.  Per her husband the patient started feeling ill ( symptoms included: cough, nasal congestion, occasional SOB) on 05/07/2018 her symptoms became worse on Wednesday 4/22  She complained of a headache accompanied by nausea and vomiting unable to tolerate oral intake. On Thursday 4/23 she continued to have the headache despite tylenol but was drinking fluids, and had a 1/2 can soup and some crackers.  The morning of 05/12/18 she complained of hot and cold flashes. She took tylenol throughout the week for these symptoms. [name] does not know how much she took.   When he found her unresponsive at 8 pm he called 911 immediately he states that he cleared the bed and laid her flat shortly after she began to vomit. He was instructed to turn her on her side which he did. He states that he believes it was  5 mins between the time 911 was called and when Fire arrived.   Downtime unknown: in the EMR it is listed as 13-14 min code but upon further investigation  per husband states that the pt had been "sleeping" all day and he does not have a  definitive time. He found her unresponsive at 8 pm but He last spoke to her at noon and she verbally responded at that time and went back to sleep.  Daughter states that she doesn't live with he mother but spoke to her via telephone throughout the week up until 4/23 in the evening and her mom denied any complaints to her. She states that her mother had a cardiac arrest back when she was pregnant with her brother and was diagnosed with peripartum cardiomyopathy. She is supposed to be on lasix which she doesn't take.  She presents on 4/[facility] to [facility] s/p cardiac arrest (initial rhythm unknown). King airway was placed by EMS and she was started on Epi gtt. Intubated in the ED received RSI at 2120. COVID negative off of vasopressors. PCCM consulted.  Past Medical History  .Marland Kitchen Active Ambulatory Problems    Diagnosis Date Noted   Acute respiratory failure ([place]) 03/22/2016   COPD exacerbation ([place]) 03/22/2016   Chronic back pain 03/22/2016   Opioid type dependence, abuse ([place]) 03/22/2016   Polycythemia 03/22/2016   Anxiety 03/22/2016   OSA on CPAP 03/22/2016   Diverticulitis 12/03/2017   Closed fracture of distal end of right radius 08/25/2015   Closed nondisplaced fracture of styloid process of right ulna 08/25/2015   Mixed anxiety and depressive disorder 12/22/2017   Obesity 12/22/2017   Spinal stenosis of lumbar region 04/12/2016   Tobacco user 12/22/2017  Acute diverticulitis 01/06/2018   Diverticulitis of colon (without mention of hemorrhage)(562.11)    Resolved Ambulatory Problems    Diagnosis Date Noted   No Resolved Ambulatory Problems   Past Medical History:  Diagnosis Date   CHF (congestive heart failure) (HCC)    COPD (chronic obstructive pulmonary disease) ([place])    Current smoker    Opiate abuse, continuous ([place])    Significant Hospital Events   s/p cardiac arrest Endotracheally intubated on 05/12/2018 HD started 4/28  Consults:  PCCM 4/24 Neurology  4/25 Nephrology 4/26 - s/o  Procedures:  4/24: ett 4/28: RIJ HD cath  Significant Diagnostic Tests:  4/25: CT Head w/o contrast no acute abnormalities 4/25: EEG discontinuous background slowing representing medication effect 4/26: MRI brain neg for acute process  Micro Data:  4/24 Blood: ngtd 4/24 covid: NEG 4/24 RVP: neg 4/24 resp: H flu  Antimicrobials:  4/24 cefepime->4/29  Interim history/subjective:  4/26: Required just one dose of nibmex last night for vent dyssynchrony. 4/27: moving spontaneously and with some purpose but not following commands, not tracking.  4/28: largely unchanged neuro exam, worsening AKI; will initiate HD 4/29: following simple commands, attempting to wean sedation 4/30: plan for HD again today, BUN 130. Following commands appropriately  Objective   Blood pressure 128/80, pulse 71, temperature 98.1 F (36.7 C), temperature source Oral, resp. rate 16, height 5\' 1"  (1.549 m), weight 91 kg, SpO2 94 %.    Vent Mode: PSV;CPAP FiO2 (%):  [30 %-40 %] 30 % Set Rate:  [18 bmp] 18 bmp Vt Set:  [500 mL] 500 mL PEEP:  [5 cmH20] 5 cmH20 Pressure Support:  [5 cmH20] 5 cmH20 Plateau Pressure:  [16 cmH20-23 cmH20] 16 cmH20   Intake/Output Summary (Last 24 hours) at 05/18/2018 0848 Last data filed at 05/18/2018 0800 Gross per 24 hour  Intake 1914.02 ml  Output 20 ml  Net 1894.02 ml   Filed Weights   05/16/18 1620 05/17/18 0500 05/18/18 0506  Weight: 91 kg 89.8 kg 91 kg    Examination: General: ill appearing female, awake, following commands HEENT: No JVD, ETT/OG in place Neuro: PERRL, EOMI, moves all 4 extremities to command and equally; answering by nods/shakes of head appropriately CV: RRR, no murmurs PULM: even/non-labored, no wheezing or rales GI: soft, non-tender, decreased bowel sounds Extremities: warm/dry, 1+ nonpitting edema  Skin: no rashes or lesions   Assessment & Plan:  S/p cardiac arrest: initial rhythm unknown presumed  PEA Not  a candidate for TTM while it is documented as a 13-14 min code time there is an unknown amt of down time that is unaccounted for prior to EMS arriving. Neurological exam on admission concerning for significant anoxic brain injury, however improving currently. Etiology thought to be respiratory from COPD exacerbation - maintain normothermia - telemetry - as neurological status and HD status improves, will plan to get echo in the coming days  Septic shock vs Cardiogenic shock S/p cardiac arrest initial rhythm unknown Suspected asp pna, gn likely Pt has a h/o CHF per daughter and is non compliant w/ Lasix; last EF documented 60-65% in 3007 w/o diastolic dysfunction. Procalcitonin trending down; leukocytosis stable and she has remained afebrile. Trach aspirate culture growing H flu - completed treatment. - goal MAP >65 - trend fever curve and leukocytosis - f/u blood cultures  Acute hypoxic and hypercapnic respiratory failure  Presumed aspiration event pneumonia, no CXR infiltrate H/o COPD on Advair, Spiriva and Daliresp uses a cpap w/ oxygen at night COPD exacerbation  In setting of cardiac arrest, possible aspiration event per family report. On admission had vent dyssynchrony which improved with bronchodilators, TV decrease and sedation. COVID and RVP negative on admission. Trach aspirate culture showing H flu, neg for beta lactamase; she has completed 5d of appropriate antibiotic coverage with cefepime. Was able to wean some yesterday; with her neurological status significantly improved today I suspect she will continued to do well from a respiratory standpoint and may be able to wean to extubate in the next day or so. -Solu-Medrol 40 mg IV q12, continue weaning every couple of days -bronchodilators to every 4 -sedation with precedex as less effect on respiratory drive, wean sedation as able to attempt PSW trials  Acute encephalopathy s/p cardiac arrest Uremia Poor neurological exam reported  several hours after RSI meds and before other sedatives started. CT head on admission negative. EEG revealed discontinuous generalized background slowing likely related to medication effect. MRI negative for anoxic brain injury or infarcts. At this time uremia is likely contributing as well. Neurology has signed off for now. She is consistently following commands today and interacting appropriately -continue addressing AKI with uremia -wean sedation as able  Elevated Transaminases In setting of cardiac arrest with unknown down time; likely represents shock liver. Trending down currently. -trend  Anuric Acute Kidney Injury - likely ATN in setting of arrest Baseline creat  0.70. Bicarb and electrolytes stable. RUS w/o retention or hydronephrosis. Nephrology on board; started [facility] 4/28 with TThS schedule. -continue maintaining adequate BP -iHD per nephrology  Best practice:  Diet: TF Pain/Anxiety/Delirium protocol (if indicated): precedex, fentanyl VAP protocol (if indicated): yes DVT prophylaxis: hep  and SCDs GI prophylaxis: Protonix Glucose control: if BG exceeds 180 mg/dl  Mobility: bedrest Code Status: Full code  Family Communication: updated 4/29 Disposition: ICU  Labs   CBC: Recent Labs  Lab 05/12/18 2135  05/14/18 0652 05/14/18 1416 05/15/18 0229 05/16/18 0504 05/17/18 0538 05/18/18 0314  WBC 11.5*  --  12.8*  --  22.2*   23.8* 22.4* 17.0* 19.4*  NEUTROABS 8.3*  --  11.3*  --  18.9*  --   --   --   HGB 13.3   < > 11.5* 10.9* 11.1*   11.1* 11.3* 11.1* 11.1*  HCT 46.5*   < > 37.8 32.0* 35.8*   34.4* 34.2* 35.0* 34.5*  MCV 105.7*  --  97.4  --  95.5   95.0 91.9 93.8 92.7  PLT 294  --  214  --  217   203 203 164 145*   < > = values in this interval not displayed.    Basic Metabolic Panel: Recent Labs  Lab 05/14/18 0652 05/14/18 1416 05/15/18 0229 05/16/18 0504 05/17/18 0538 05/18/18 0314  NA 140 136 138 139 138 136  K 3.8 3.8 3.5 3.7 4.5 5.0  CL 94*  --  96*  92* 95* 97*  CO2 31  --  26 28 28 26   GLUCOSE 137*  --  143* 166* 142* 144*  BUN 51*  --  78* 133* 106* 141*  CREATININE 3.29*  --  4.25* 6.03* 5.07* 6.05*  CALCIUM 8.6*  --  8.2* 8.9 8.3* 8.6*  MG 2.6*  --   --  2.8* 2.3 2.5*  PHOS 3.7  --   --  2.5 3.7 4.7*   GFR: Estimated Creatinine Clearance: 11.7 mL/min (A) (by C-G formula based on SCr of 6.05 mg/dL (H)). Recent Labs  Lab 05/12/18 2135 05/13/18 0034  05/15/18 0229 05/15/18  3662 05/15/18 9476 05/16/18 0504 05/17/18 0538 05/18/18 0314  PROCALCITON 0.68  --   --   --  9.04  --  7.36  --   --   WBC 11.5*  --    < > 22.2*   23.8*  --   --  22.4* 17.0* 19.4*  LATICACIDVEN 7.7* 3.1*  --   --   --  0.8  --   --   --    < > = values in this interval not displayed.    Liver Function Tests: Recent Labs  Lab 05/12/18 2135 05/15/18 0229 05/16/18 0504 05/17/18 0538 05/18/18 0314  AST 887* 1,441* 483* 178* 89*  ALT 924* 2,892* 2,195* 1,461* 1,017*  ALKPHOS 105 136* 122 100 88  BILITOT 0.9 1.0 0.7 0.4 0.5  PROT 7.4 6.3* 6.1* 5.9* 5.7*  ALBUMIN 3.0* 2.4* 2.6* 2.4* 2.3*   No results for input(s): LIPASE, AMYLASE in the last 168 hours. Recent Labs  Lab 05/14/18 1326 05/17/18 0700  AMMONIA 55* 37*    ABG    Component Value Date/Time   PHART 7.373 05/14/2018 1416   PCO2ART 58.2 (H) 05/14/2018 1416   PO2ART 78.0 (L) 05/14/2018 1416   HCO3 33.9 (H) 05/14/2018 1416   TCO2 36 (H) 05/14/2018 1416   O2SAT 95.0 05/14/2018 1416     Coagulation Profile: No results for input(s): INR, PROTIME in the last 168 hours.  Cardiac Enzymes: Recent Labs  Lab 05/14/18 1326  CKTOTAL 926*    HbA1C: No results found for: HGBA1C  CBG: Recent Labs  Lab 05/17/18 1530 05/17/18 2018 05/17/18 2315 05/18/18 0327 05/18/18 0755  GLUCAP 94 109* 143* [place]   Alphonzo Grieve, MD PGY3 Pager [phone]

## 2018-05-18 NOTE — Progress Notes (Signed)
Patient attempting to pull down ETT holder, oxygen saturation dropped tp 80's. 100% oxygen given.  Sedation increased, ETT tube holder  readjusted . Oxygen saturation back in  90's. Verified ETT tube 24cm unchanged at the lip

## 2018-05-18 NOTE — Progress Notes (Signed)
  Echocardiogram 2D Echocardiogram has been performed.  Misty Green 05/18/2018, 4:26 PM

## 2018-05-18 NOTE — Progress Notes (Addendum)
Nutrition Follow-up  RD working remotely.  DOCUMENTATION CODES:   Not applicable  INTERVENTION:   - Once diet advanced, Ensure Enlive po BID, each supplement provides 350 kcal and 20 grams of protein  - d/c Vital High Protein TF orders  NUTRITION DIAGNOSIS:   Inadequate oral intake related to acute illness as evidenced by NPO status.  Ongoing  GOAL:   Patient will meet greater than or equal to 90% of their needs  Unmet at this time, pending diet advancement  MONITOR:   Diet advancement, Weight trends, I & O's, Labs  REASON FOR ASSESSMENT:   Consult Enteral/tube feeding initiation and management  ASSESSMENT:    48 yo female admitted post cardiac arrest. PMH includes COPD, CHF, diverticulosis s/p colostomy.  4/28 - HD initiated 4/30 - extubated  Pt extubated this AM.  Overall, weight up 12 lbs since admission. Will continue to monitor trends. Unsure of dry weight but suspect it is closer to admission weight of 85.6 kg.  Pt's diet has not yet been advanced post-extubation. Once diet advanced, RD to order Ensure Enlive oral nutrition supplement to aid in pt meeting kcal and protein needs.  Medications reviewed and include: SSI, lactulose, Solu-medrol, Protonix  Labs reviewed: BUN 141 (H), creatinine 6.05 (H), phosphorus 4.7 (H), magnesium 2.5 (H), elevated LFTs CBG's: 107, 92, 144, 143, 109 x 24 hours  UOP: 20 ml x 24 hours I/O's: +10.8 L since admit  Diet Order:   Diet Order    None      EDUCATION NEEDS:   Not appropriate for education at this time  Skin:  Skin Assessment: Reviewed RN Assessment  Last BM:  05/17/18 via colostomy  Height:   Ht Readings from Last 1 Encounters:  05/16/18 5\' 1"  (1.549 m)    Weight:   Wt Readings from Last 1 Encounters:  05/18/18 91 kg    Ideal Body Weight:  47.7 kg  BMI:  Body mass index is 37.91 kg/m.  Estimated Nutritional Needs:   Kcal:  2878-6767  Protein:  90-105 grams  Fluid:  1.7-1.9  L    Gaynell Face, MS, RD, LDN Inpatient Clinical Dietitian Pager: [phone] Weekend/After Hours: [phone]

## 2018-05-18 NOTE — Progress Notes (Signed)
Assisted tele visit to patient with husband.  Abdulkarim Eberlin, Aliene Beams, RN

## 2018-05-18 NOTE — Progress Notes (Signed)
RT obtained ABG on pt with the following results. RT will continue to monitor.   Results for HELLEN, SHANLEY (MRN [id number]) as of 05/18/2018 22:51  Ref. Range 05/18/2018 22:42  Sample type Unknown ARTERIAL  pH, Arterial Latest Ref Range: 7.350 - 7.450  7.288 (L)  pCO2 arterial Latest Ref Range: 32.0 - 48.0 mmHg 52.9 (H)  pO2, Arterial Latest Ref Range: 83.0 - 108.0 mmHg 143.0 (H)  TCO2 Latest Ref Range: 22 - 32 mmol/L 27  Acid-base deficit Latest Ref Range: 0.0 - 2.0 mmol/L 2.0  Bicarbonate Latest Ref Range: 20.0 - 28.0 mmol/L 25.5  O2 Saturation Latest Units: % 99.0  Patient temperature Unknown 97.8 F  Collection site Unknown BRACHIAL ARTERY

## 2018-05-18 NOTE — Progress Notes (Signed)
eLink Physician-Brief Progress Note Patient Name: Misty Green DOB: March 27, 1970 MRN: [id number]   Date of Service  05/18/2018  HPI/Events of Note  Pt had a spell of desaturation and had copious secretions with suctioning, he is now on 100 % FiO2 and 10 cm of PEEP with sats 93-94 %. RN reports clear bilateral breath sounds.  eICU Interventions  I agree with temporarily increasing FiO2 and PEEP. Will wean FiO2 followed by PEEP as tolerated, defer CXR unless desaturation recurs.     Intervention Category Minor Interventions: Other:  Frederik Pear 05/18/2018, 9:49 PM

## 2018-05-18 NOTE — Progress Notes (Signed)
Admit: 05/12/2018 LOS: 6  10F s/p cardiac arrest of unclear duration/ VDRF, HIE, and anuric AKI from normal baseline SCr.  Subjective:   Misty Green Undergoing SBT at the current time . BUN increased to 141, potassium 5.0 . Minimal urine output . More awake, alert, following commands  04/29 0701 - 04/30 0700 In: 2658.6 [I.V.:734.4; NG/GT:1824.2; IV Piggyback:100.1] Out: 20 [Urine:20]  Filed Weights   05/16/18 1620 05/17/18 0500 05/18/18 0506  Weight: 91 kg 89.8 kg 91 kg    Scheduled Meds: . ALPRAZolam  1 mg Oral BID  . budesonide (PULMICORT) nebulizer solution  0.25 mg Nebulization BID  . chlorhexidine gluconate (MEDLINE KIT)  15 mL Mouth Rinse BID  . Chlorhexidine Gluconate Cloth  6 each Topical Daily  . Chlorhexidine Gluconate Cloth  6 each Topical Q0600  . heparin  5,000 Units Subcutaneous Q8H  . insulin aspart  0-15 Units Subcutaneous Q4H  . ipratropium-albuterol  3 mL Nebulization Q6H  . lactulose  20 g Oral BID  . mouth rinse  15 mL Mouth Rinse 10 times per day  . methylPREDNISolone (SOLU-MEDROL) injection  40 mg Intravenous Q6H  . pantoprazole sodium  40 mg Per Tube Q1200   Continuous Infusions: . sodium chloride    . sodium chloride    . dexmedetomidine (PRECEDEX) IV infusion 1.2 mcg/kg/hr (05/18/18 1151)  . feeding supplement (VITAL HIGH PROTEIN) 50 mL/hr at 05/18/18 0502  . fentaNYL infusion INTRAVENOUS 75 mcg/hr (05/18/18 0800)   PRN Meds:.sodium chloride, sodium chloride, albuterol, alteplase, fentaNYL (SUBLIMAZE) injection, heparin, lidocaine (PF), lidocaine-prilocaine, pentafluoroprop-tetrafluoroeth, sodium chloride flush  Current Labs: reviewed    Physical Exam:  Blood pressure 107/73, pulse 69, temperature (!) 97.5 F (36.4 C), temperature source Oral, resp. rate 14, height _0  (1.549 m), weight 91 kg, SpO2 96 %. GEN: intubated, more awake and alert ENT: NCAT, oral ETT in place EYES: eyes closed CV: borerline bradycardic, regular, nl s1s2 no rub PULM: ctab  with coarse ant bs ABD: s/nt/nd; LLQ ostomy SKIN: no sig rashes EXT:no sig LEE NEURO: intermittently followscommands  A 1. Anuric AKI, normal baseline; ischemic ATN; K and HCO3 ok.  neg renal US 4/27; subnephrotic proteinuria; start HD 4/28 2. Cardiac arrest with resultant HIE, CCM and Neuro following; improving 3. COPD; on steroids 4. Leukocytosis, on steroids  5. Inc LFts, likely shock related; per CCM; improving 6. Longstanding colostomy 7. ? PNA, s/p cefepime  P . No evidence of renal recovery, cont HD THS schedule, iHD: 2K, 2.5h, Qb 300 Qd 500, 1.5L UF, tight heparin . Medication Issues; o Preferred narcotic agents for pain control are hydromorphone, fentanyl, and methadone. Morphine should not be used.  o Baclofen should be avoided o Avoid oral sodium phosphate and magnesium citrate based laxatives / bowel preps    Pearson Grippe MD 05/18/2018, 12:26 PM  Recent Labs  Lab 05/16/18 0504 05/17/18 0538 05/18/18 0314  NA 139 138 136  K 3.7 4.5 5.0  CL 92* 95* 97*  CO2 _1 GLUCOSE 166* 142* 144*  BUN 133* 106* 141*  CREATININE 6.03* 5.07* 6.05*  CALCIUM 8.9 8.3* 8.6*  PHOS 2.5 3.7 4.7*   Recent Labs  Lab 05/12/18 2135  05/14/18 0652  05/15/18 0229 05/16/18 0504 05/17/18 0538 05/18/18 0314  WBC 11.5*  --  12.8*  --  22.2*  23.8* 22.4* 17.0* 19.4*  NEUTROABS 8.3*  --  11.3*  --  18.9*  --   --   --   HGB 13.3   < >  11.5*   < > 11.1*  11.1* 11.3* 11.1* 11.1*  HCT 46.5*   < > 37.8   < > 35.8*  34.4* 34.2* 35.0* 34.5*  MCV 105.7*  --  97.4  --  95.5  95.0 91.9 93.8 92.7  PLT 294  --  214  --  217  203 203 164 145*   < > = values in this interval not displayed.

## 2018-05-18 NOTE — Progress Notes (Signed)
eLink Physician-Brief Progress Note Patient Name: Misty Green DOB: Dec 14, 1970 MRN: [id number]   Date of Service  05/18/2018  HPI/Events of Note  Bedside RN requesting additional sedation due to pt intermittently pulling off her BIPAP mask, pt is on Precedex at 1.2 mcg  eICU Interventions  Would not add additional sedatives at his time, would advise close monitoring and re-direction as needed.        Frederik Pear 05/18/2018, 9:43 PM

## 2018-05-18 NOTE — Progress Notes (Signed)
Assisted tele visit to patient with daughter.  Misty Mcquitty Anderson, RN   

## 2018-05-18 NOTE — Procedures (Signed)
Extubation Procedure Note  Patient Details:   Name: Misty Green DOB: Mar 18, 1970 MRN: [id number]   Airway Documentation:    Vent end date: 05/18/18 Vent end time: 1250   Evaluation  O2 sats: stable throughout Complications: No apparent complications Patient did tolerate procedure well. Bilateral Breath Sounds: Diminished   Yes  Carson Myrtle 05/18/2018, 12:53 PM

## 2018-05-19 DIAGNOSIS — Z9989 Dependence on other enabling machines and devices: Secondary | ICD-10-CM

## 2018-05-19 DIAGNOSIS — J441 Chronic obstructive pulmonary disease with (acute) exacerbation: Secondary | ICD-10-CM

## 2018-05-19 DIAGNOSIS — G4733 Obstructive sleep apnea (adult) (pediatric): Secondary | ICD-10-CM

## 2018-05-19 LAB — CBC
HCT: 33 % — ABNORMAL LOW (ref 36.0–46.0)
Hemoglobin: 10.6 g/dL — ABNORMAL LOW (ref 12.0–15.0)
MCH: 29.8 pg (ref 26.0–34.0)
MCHC: 32.1 g/dL (ref 30.0–36.0)
MCV: 92.7 fL (ref 80.0–100.0)
Platelets: 131 10*3/uL — ABNORMAL LOW (ref 150–400)
RBC: 3.56 MIL/uL — ABNORMAL LOW (ref 3.87–5.11)
RDW: 13.7 % (ref 11.5–15.5)
WBC: 16 10*3/uL — ABNORMAL HIGH (ref 4.0–10.5)
nRBC: 0 % (ref 0.0–0.2)

## 2018-05-19 LAB — BLOOD GAS, ARTERIAL
Acid-base deficit: 0.5 mmol/L (ref 0.0–2.0)
Bicarbonate: 25.7 mmol/L (ref 20.0–28.0)
Drawn by: 365271
O2 Content: 6 L/min
O2 Saturation: 97.3 %
Patient temperature: 98.6
pCO2 arterial: 58.9 mmHg — ABNORMAL HIGH (ref 32.0–48.0)
pH, Arterial: 7.263 — ABNORMAL LOW (ref 7.350–7.450)
pO2, Arterial: 116 mmHg — ABNORMAL HIGH (ref 83.0–108.0)

## 2018-05-19 LAB — BASIC METABOLIC PANEL
Anion gap: 15 (ref 5–15)
Anion gap: 17 — ABNORMAL HIGH (ref 5–15)
BUN: 153 mg/dL — ABNORMAL HIGH (ref 6–20)
BUN: 97 mg/dL — ABNORMAL HIGH (ref 6–20)
CO2: 25 mmol/L (ref 22–32)
CO2: 25 mmol/L (ref 22–32)
Calcium: 8.5 mg/dL — ABNORMAL LOW (ref 8.9–10.3)
Calcium: 8.9 mg/dL (ref 8.9–10.3)
Chloride: 97 mmol/L — ABNORMAL LOW (ref 98–111)
Chloride: 95 mmol/L — ABNORMAL LOW (ref 98–111)
Creatinine, Ser: 6.95 mg/dL — ABNORMAL HIGH (ref 0.44–1.00)
Creatinine, Ser: 5.01 mg/dL — ABNORMAL HIGH (ref 0.44–1.00)
GFR calc Af Amer: 7 mL/min — ABNORMAL LOW (ref >60)
GFR calc Af Amer: 11 mL/min — ABNORMAL LOW (ref >60)
GFR calc non Af Amer: 6 mL/min — ABNORMAL LOW (ref >60)
GFR calc non Af Amer: 9 mL/min — ABNORMAL LOW (ref >60)
Glucose, Bld: 125 mg/dL — ABNORMAL HIGH (ref 70–99)
Glucose, Bld: 166 mg/dL — ABNORMAL HIGH (ref 70–99)
Potassium: 6 mmol/L — ABNORMAL HIGH (ref 3.5–5.1)
Potassium: 5.1 mmol/L (ref 3.5–5.1)
Sodium: 137 mmol/L (ref 135–145)
Sodium: 137 mmol/L (ref 135–145)

## 2018-05-19 LAB — HEPATIC FUNCTION PANEL
ALT: 748 U/L — ABNORMAL HIGH (ref 0–44)
AST: 64 U/L — ABNORMAL HIGH (ref 15–41)
Albumin: 2.5 g/dL — ABNORMAL LOW (ref 3.5–5.0)
Alkaline Phosphatase: 81 U/L (ref 38–126)
Bilirubin, Direct: 0.1 mg/dL (ref 0.0–0.2)
Indirect Bilirubin: 0.7 mg/dL (ref 0.3–0.9)
Total Bilirubin: 0.8 mg/dL (ref 0.3–1.2)
Total Protein: 5.6 g/dL — ABNORMAL LOW (ref 6.5–8.1)

## 2018-05-19 LAB — GLUCOSE, CAPILLARY
Glucose-Capillary: 118 mg/dL — ABNORMAL HIGH (ref 70–99)
Glucose-Capillary: 107 mg/dL — ABNORMAL HIGH (ref 70–99)
Glucose-Capillary: 167 mg/dL — ABNORMAL HIGH (ref 70–99)
Glucose-Capillary: 97 mg/dL (ref 70–99)
Glucose-Capillary: 106 mg/dL — ABNORMAL HIGH (ref 70–99)

## 2018-05-19 LAB — RENAL FUNCTION PANEL
Albumin: 2.4 g/dL — ABNORMAL LOW (ref 3.5–5.0)
Anion gap: 19 — ABNORMAL HIGH (ref 5–15)
BUN: 163 mg/dL — ABNORMAL HIGH (ref 6–20)
CO2: 24 mmol/L (ref 22–32)
Calcium: 8.9 mg/dL (ref 8.9–10.3)
Chloride: 95 mmol/L — ABNORMAL LOW (ref 98–111)
Creatinine, Ser: 7.02 mg/dL — ABNORMAL HIGH (ref 0.44–1.00)
GFR calc Af Amer: 7 mL/min — ABNORMAL LOW (ref >60)
GFR calc non Af Amer: 6 mL/min — ABNORMAL LOW (ref >60)
Glucose, Bld: 125 mg/dL — ABNORMAL HIGH (ref 70–99)
Phosphorus: 6.8 mg/dL — ABNORMAL HIGH (ref 2.5–4.6)
Potassium: 6 mmol/L — ABNORMAL HIGH (ref 3.5–5.1)
Sodium: 138 mmol/L (ref 135–145)

## 2018-05-19 LAB — POTASSIUM: Potassium: 6.1 mmol/L — ABNORMAL HIGH (ref 3.5–5.1)

## 2018-05-19 LAB — POCT I-STAT 7, (LYTES, BLD GAS, ICA,H+H)
Acid-base deficit: 2 mmol/L (ref 0.0–2.0)
Bicarbonate: 25.2 mmol/L (ref 20.0–28.0)
Calcium, Ion: 1.16 mmol/L (ref 1.15–1.40)
HCT: 29 % — ABNORMAL LOW (ref 36.0–46.0)
Hemoglobin: 9.9 g/dL — ABNORMAL LOW (ref 12.0–15.0)
O2 Saturation: 98 %
Patient temperature: 97.7
Potassium: 6 mmol/L — ABNORMAL HIGH (ref 3.5–5.1)
Sodium: 134 mmol/L — ABNORMAL LOW (ref 135–145)
TCO2: 27 mmol/L (ref 22–32)
pCO2 arterial: 50.1 mmHg — ABNORMAL HIGH (ref 32.0–48.0)
pH, Arterial: 7.307 — ABNORMAL LOW (ref 7.350–7.450)
pO2, Arterial: 121 mmHg — ABNORMAL HIGH (ref 83.0–108.0)

## 2018-05-19 LAB — MAGNESIUM: Magnesium: 2.6 mg/dL — ABNORMAL HIGH (ref 1.7–2.4)

## 2018-05-19 LAB — CK: Total CK: 276 U/L — ABNORMAL HIGH (ref 38–234)

## 2018-05-19 MED ORDER — METHYLPREDNISOLONE SODIUM SUCC 40 MG IJ SOLR
40.0000 mg | Freq: Every day | INTRAMUSCULAR | Status: DC
  Administered 2018-05-20 – 2018-05-27 (×8): 40 mg via INTRAVENOUS
  Filled 2018-05-19 (×9): qty 1

## 2018-05-19 MED ORDER — RESOURCE THICKENUP CLEAR PO POWD
ORAL | Status: DC | PRN
  Filled 2018-05-19: qty 125

## 2018-05-19 MED ORDER — SODIUM POLYSTYRENE SULFONATE 15 GM/60ML PO SUSP
30.0000 g | Freq: Once | ORAL | Status: DC
  Filled 2018-05-19: qty 120

## 2018-05-19 MED ORDER — SODIUM POLYSTYRENE SULFONATE 15 GM/60ML PO SUSP
15.0000 g | Freq: Once | ORAL | Status: DC
  Filled 2018-05-19 (×2): qty 60

## 2018-05-19 MED ORDER — PANTOPRAZOLE SODIUM 40 MG PO TBEC
40.0000 mg | DELAYED_RELEASE_TABLET | Freq: Every day | ORAL | Status: DC
  Administered 2018-05-19 – 2018-05-20 (×2): 40 mg via ORAL
  Filled 2018-05-19 (×2): qty 1

## 2018-05-19 MED ORDER — ALPRAZOLAM 0.5 MG PO TABS
1.0000 mg | ORAL_TABLET | Freq: Three times a day (TID) | ORAL | Status: DC
  Administered 2018-05-19 – 2018-05-20 (×3): 1 mg via ORAL
  Filled 2018-05-19 (×3): qty 2

## 2018-05-19 MED ORDER — ENSURE ENLIVE PO LIQD
237.0000 mL | Freq: Two times a day (BID) | ORAL | Status: DC
  Administered 2018-05-19 – 2018-05-23 (×4): 237 mL via ORAL

## 2018-05-19 MED ORDER — HEPARIN SODIUM (PORCINE) 1000 UNIT/ML IJ SOLN
2.4000 mL | Freq: Once | INTRAMUSCULAR | Status: AC
  Administered 2018-05-19: 08:00:00 2400 [IU] via INTRAVENOUS

## 2018-05-19 NOTE — Progress Notes (Signed)
Admit: 05/12/2018 LOS: 7  66F s/p cardiac arrest of unclear duration/ VDRF, HIE, and anuric AKI from normal baseline SCr.  Subjective:   . Extubated, on 4L . HD overnight, 1.1L UF . K 6.1 this AM was taken prior to HD . UOP up to 275m yesterday . PT awake, alert, appropriate this AM  04/30 0701 - 05/01 0700 In: 570.6 [I.V.:570.6] Out: 200 [Urine:200]  Filed Weights   05/18/18 0506 05/19/18 0446 05/19/18 0730  Weight: 91 kg 93 kg 91.9 kg    Scheduled Meds: . ALPRAZolam  1 mg Oral BID  . budesonide (PULMICORT) nebulizer solution  0.25 mg Nebulization BID  . chlorhexidine gluconate (MEDLINE KIT)  15 mL Mouth Rinse BID  . Chlorhexidine Gluconate Cloth  6 each Topical Daily  . Chlorhexidine Gluconate Cloth  6 each Topical Q0600  . heparin  5,000 Units Subcutaneous Q8H  . insulin aspart  0-15 Units Subcutaneous Q4H  . ipratropium-albuterol  3 mL Nebulization Q6H  . lactulose  20 g Oral BID  . mouth rinse  15 mL Mouth Rinse BID  . methylPREDNISolone (SOLU-MEDROL) injection  40 mg Intravenous Q6H  . pantoprazole sodium  40 mg Per Tube Q1200  . sodium polystyrene  30 g Rectal Once   Continuous Infusions: . sodium chloride    . sodium chloride    . dexmedetomidine (PRECEDEX) IV infusion 1.2 mcg/kg/hr (05/19/18 0600)   PRN Meds:.sodium chloride, sodium chloride, albuterol, alteplase, fentaNYL (SUBLIMAZE) injection, heparin, heparin, lidocaine (PF), lidocaine-prilocaine, pentafluoroprop-tetrafluoroeth, sodium chloride flush  Current Labs: reviewed    Physical Exam:  Blood pressure 134/83, pulse 70, temperature 97.7 F (36.5 C), temperature source Oral, resp. rate 20, height 5' 1"  (1.549 m), weight 91.9 kg, SpO2 99 %. GEN: more awake and alert, conversant ENT: NCAT, EYES: EOMI CV: regular, nl s1s2 no rub PULM: ctab with coarse ant bs ABD: s/nt/nd; LLQ ostomy SKIN: no sig rashes EXT:no sig LEE NEURO: intermittently followscommands  A 1. Anuric AKI, normal baseline;  ischemic ATN; K and HCO3 ok.  neg renal [place]4/27; subnephrotic proteinuria; start HD 4/28 2. Cardiac arrest with resultant HIE, CCM and Neuro following; improving 3. COPD; on steroids 4. Leukocytosis, on steroids  5. Inc LFts, likely shock related; per CCM; improving 6. Longstanding colostomy 7. ? PNA, s/p cefepime  P . Not yet recvered GFR, cont HD THS schedule, iHD: 2K, 3.5h, Qb 400 Qd 600, 3L UF, tight heparin . Repeat BMP at noon for hyperkalemia . Medication Issues; o Preferred narcotic agents for pain control are hydromorphone, fentanyl, and methadone. Morphine should not be used.  o Baclofen should be avoided o Avoid oral sodium phosphate and magnesium citrate based laxatives / bowel preps    RPearson GrippeMD 05/19/2018, 8:21 AM  Recent Labs  Lab 05/17/18 [id number]/20 [id number] 05/18/18 2326 05/19/18 0226 05/19/18 0454 05/19/18 0523  NA 138 136   < > 137 138 134*  --   K 4.5 5.0   < > 6.0* 6.0* 6.0* 6.1*  CL 95* 97*  --  97* 95*  --   --   CO2 28 26  --  25 24  --   --   GLUCOSE 142* 144*  --  125* 125*  --   --   BUN 106* 141*  --  153* 163*  --   --   CREATININE 5.07* 6.05*  --  6.95* 7.02*  --   --   CALCIUM 8.3* 8.6*  --  8.5* 8.9  --   --  PHOS 3.7 4.7*  --   --  6.8*  --   --    < > = values in this interval not displayed.   Recent Labs  Lab 05/12/18 2135  05/14/18 9806  05/15/18 0229  05/17/18 0538 05/18/18 0314  05/18/18 2242 05/19/18 0226 05/19/18 0454  WBC 11.5*  --  12.8*  --  22.2*  23.8*   < > 17.0* 19.4*  --   --  16.0*  --   NEUTROABS 8.3*  --  11.3*  --  18.9*  --   --   --   --   --   --   --   HGB 13.3   < > 11.5*   < > 11.1*  11.1*   < > 11.1* 11.1*   < > 10.2* 10.6* 9.9*  HCT 46.5*   < > 37.8   < > 35.8*  34.4*   < > 35.0* 34.5*   < > 30.0* 33.0* 29.0*  MCV 105.7*  --  97.4  --  95.5  95.0   < > 93.8 92.7  --   --  92.7  --   PLT 294  --  214  --  217  203   < > 164 145*  --   --  131*  --    < > = values in this interval not displayed.

## 2018-05-19 NOTE — Evaluation (Signed)
Physical Therapy Evaluation Patient Details Name: Misty Green MRN: [id number] DOB: Feb 03, 1970 Today's Date: 05/19/2018   History of Present Illness  48 yr old F w/ PMHx COPD, CHF, chronic back pain from spinal stenoses, diverticulosis s/p colostomy presents s/p cardiac arrest.   Clinical Impression  Pt admitted with above diagnosis. Pt currently with functional limitations due to the deficits listed below (see PT Problem List). Pt was able to stand and pivot to chair with +2 mod assist with LE weakness and poor coordination needing incr support.  Pt confused as well.  Pt easily distracted.  REcommend Rehab consult as pt is alone during day at home and will need PT OT and ST.  Pt will benefit from skilled PT to increase their independence and safety with mobility to allow discharge to the venue listed below.      Follow Up Recommendations CIR;Supervision/Assistance - 24 hour    Equipment Recommendations  Other (comment)(TBA)    Recommendations for Other Services       Precautions / Restrictions Precautions Precautions: Fall Restrictions Weight Bearing Restrictions: No      Mobility  Bed Mobility Overal bed mobility: Needs Assistance Bed Mobility: Supine to Sit     Supine to sit: Min guard     General bed mobility comments: needed min guard due to impulsivity  Transfers Overall transfer level: Needs assistance Equipment used: 2 person hand held assist Transfers: Sit to/from [facility] Sit to Stand: Min assist;+2 physical assistance;Mod assist Stand pivot transfers: Min assist;Mod assist;+2 physical assistance       General transfer comment: Pt was able to stand to feet with mod assist and cues with unsteadiness noted due to lack of coordination as well as knee buckling on first attempt.  Second attempt pt was able to step around to recliner.   Ambulation/Gait                Stairs            Wheelchair Mobility    Modified Rankin  (Stroke Patients Only)       Balance Overall balance assessment: Needs assistance Sitting-balance support: No upper extremity supported;Feet supported Sitting balance-Leahy Scale: Fair     Standing balance support: Bilateral upper extremity supported;During functional activity Standing balance-Leahy Scale: Poor Standing balance comment: bil UE suport and external support needed                             Pertinent Vitals/Pain Pain Assessment: No/denies pain    Home Living Family/patient expects to be discharged to:: Private residence Living Arrangements: Spouse/significant other Available Help at Discharge: Family;Neighbor;Available PRN/intermittently Type of Home: House Home Access: Stairs to enter Entrance Stairs-Rails: Can reach both Entrance Stairs-Number of Steps: 5 back; 4 in front Home Layout: One level Home Equipment: None      Prior Function Level of Independence: Independent         Comments: Independent with all ADL/IADL tasks and mobility; takes care of her 3 grandchildren per admit in 2019     Hand Dominance   Dominant Hand: Right    Extremity/Trunk Assessment   Upper Extremity Assessment Upper Extremity Assessment: Defer to OT evaluation    Lower Extremity Assessment Lower Extremity Assessment: Generalized weakness;RLE deficits/detail;LLE deficits/detail RLE Coordination: decreased gross motor LLE Coordination: decreased gross motor    Cervical / Trunk Assessment Cervical / Trunk Assessment: Normal  Communication   Communication: Expressive difficulties  Cognition Arousal/Alertness:  Awake/alert Behavior During Therapy: Restless;Impulsive Overall Cognitive Status: Impaired/Different from baseline Area of Impairment: Orientation;Memory;Following commands;Safety/judgement;Awareness;Problem solving                 Orientation Level: Disoriented to;Situation;Time;Place   Memory: Decreased short-term memory;Decreased recall  of precautions Following Commands: Follows one step commands inconsistently;Follows one step commands with increased time Safety/Judgement: Decreased awareness of safety;Decreased awareness of deficits   Problem Solving: Slow processing;Decreased initiation;Difficulty sequencing;Requires verbal cues;Requires tactile cues General Comments: Easily distracted which nurse states daughter stated was premorbid      General Comments      Exercises General Exercises - Lower Extremity Long Arc Quad: AROM;Both;10 reps;Seated   Assessment/Plan    PT Assessment Patient needs continued PT services  PT Problem List Decreased mobility;Decreased coordination;Decreased activity tolerance;Decreased knowledge of use of DME;Decreased safety awareness;Decreased knowledge of precautions;Cardiopulmonary status limiting activity       PT Treatment Interventions DME instruction;Gait training;Functional mobility training;Therapeutic activities;Therapeutic exercise;Stair training;Balance training;Patient/family education    PT Goals (Current goals can be found in the Care Plan section)  Acute Rehab PT Goals Patient Stated Goal: to go home PT Goal Formulation: With patient Time For Goal Achievement: 06/02/18 Potential to Achieve Goals: Good    Frequency Min 3X/week   Barriers to discharge Decreased caregiver support(husband works)      Co-evaluation PT/OT/SLP Co-Evaluation/Treatment: Yes Reason for Co-Treatment: Complexity of the patient's impairments (multi-system involvement);For patient/therapist safety PT goals addressed during session: Mobility/safety with mobility         AM-PAC PT "6 Clicks" Mobility  Outcome Measure Help needed turning from your back to your side while in a flat bed without using bedrails?: A Little Help needed moving from lying on your back to sitting on the side of a flat bed without using bedrails?: A Little Help needed moving to and from a bed to a chair (including a  wheelchair)?: A Lot Help needed standing up from a chair using your arms (e.g., wheelchair or bedside chair)?: A Lot Help needed to walk in hospital room?: Total Help needed climbing 3-5 steps with a railing? : Total 6 Click Score: 12    End of Session Equipment Utilized During Treatment: Gait belt;Oxygen(6LO2) Activity Tolerance: Patient limited by fatigue Patient left: in chair;with call bell/phone within reach;with chair alarm set Nurse Communication: Mobility status PT Visit Diagnosis: Unsteadiness on feet (R26.81);Muscle weakness (generalized) (M62.81)    Time: 1553-1610 PT Time Calculation (min) (ACUTE ONLY): 17 min   Charges:   PT Evaluation $PT Eval Moderate Complexity: 1 Mod          Misty Green,PT Acute Rehabilitation Services Pager:  [phone]  Office:  [phone]    Misty Green 05/19/2018, 4:31 PM

## 2018-05-19 NOTE — Progress Notes (Signed)
..   NAME:  JALA DUNDON, MRN:  [id number], DOB:  04-08-1970, LOS: 7 ADMISSION DATE:  05/12/2018, CONSULTATION DATE:  05/13/2018 REFERRING MD:  ED PROVIDER, CHIEF COMPLAINT:  S/p cardiac arrest   Brief History   48 yr old F w/ PMHx COPD, CHF, chronic back pain from spinal stenoses, diverticulosis s/p colostomy presents s/p cardiac arrest. PCCM consulted for admission.  History of present illness   (History obtained from EMR, family and acct of other providers)  48 yr old F w/ PMHx sig for COPD, CHF, spinal stenoses, diverticulosis s/p colostomy  Her baseline is awake alert oriented x 4 fully functional with iADLS per her daughter she is not very compliant with her medications, she last saw her primary doctor almost a yr ago per her husband.  She uses a CPAP at night which she received 3L O2 through but does not require supplemental oxygen during awake and active periods.  Per her husband the patient started feeling ill ( symptoms included: cough, nasal congestion, occasional SOB) on 05/07/2018 her symptoms became worse on Wednesday 4/22  She complained of a headache accompanied by nausea and vomiting unable to tolerate oral intake. On Thursday 4/23 she continued to have the headache despite tylenol but was drinking fluids, and had a 1/2 can soup and some crackers.  The morning of 05/12/18 she complained of hot and cold flashes. She took tylenol throughout the week for these symptoms. [name] does not know how much she took.   When he found her unresponsive at 8 pm he called 911 immediately he states that he cleared the bed and laid her flat shortly after she began to vomit. He was instructed to turn her on her side which he did. He states that he believes it was  5 mins between the time 911 was called and when Fire arrived.   Downtime unknown: in the EMR it is listed as 13-14 min code but upon further investigation  per husband states that the pt had been "sleeping" all day and he does not have a  definitive time. He found her unresponsive at 8 pm but He last spoke to her at noon and she verbally responded at that time and went back to sleep.  Daughter states that she doesn't live with he mother but spoke to her via telephone throughout the week up until 4/23 in the evening and her mom denied any complaints to her. She states that her mother had a cardiac arrest back when she was pregnant with her brother and was diagnosed with peripartum cardiomyopathy. She is supposed to be on lasix which she doesn't take.  She presents on 4/25 to [facility] s/p cardiac arrest (initial rhythm unknown). King airway was placed by EMS and she was started on Epi gtt. Intubated in the ED received RSI at 2120. COVID negative off of vasopressors. PCCM consulted.  Past Medical History  .Marland Kitchen Active Ambulatory Problems    Diagnosis Date Noted  . Acute respiratory failure ([place]) 03/22/2016  . COPD exacerbation ([place]) 03/22/2016  . Chronic back pain 03/22/2016  . Opioid type dependence, abuse ([place]) 03/22/2016  . Polycythemia 03/22/2016  . Anxiety 03/22/2016  . OSA on CPAP 03/22/2016  . Diverticulitis 12/03/2017  . Closed fracture of distal end of right radius 08/25/2015  . Closed nondisplaced fracture of styloid process of right ulna 08/25/2015  . Mixed anxiety and depressive disorder 12/22/2017  . Obesity 12/22/2017  . Spinal stenosis of lumbar region 04/12/2016  . Tobacco user 12/22/2017  .  Acute diverticulitis 01/06/2018  . Diverticulitis of colon (without mention of hemorrhage)(562.11)    Resolved Ambulatory Problems    Diagnosis Date Noted  . No Resolved Ambulatory Problems   Past Medical History:  Diagnosis Date  . CHF (congestive heart failure) ([place])   . COPD (chronic obstructive pulmonary disease) ([place])   . Current smoker   . Opiate abuse, continuous ([place])    Significant Hospital Events   s/p cardiac arrest Endotracheally intubated on 05/12/2018 HD started 4/28  Consults:  PCCM 4/24 Neurology  4/25 Nephrology 4/26 - s/o  Procedures:  4/24: ett 4/28: RIJ HD cath  Significant Diagnostic Tests:  4/25: CT Head w/o contrast no acute abnormalities 4/25: EEG discontinuous background slowing representing medication effect 4/26: MRI brain neg for acute process  Micro Data:  4/24 Blood: ngtd 4/24 covid: NEG 4/24 RVP: neg 4/24 resp: H flu  Antimicrobials:  4/24 cefepime->4/29  Interim history/subjective:  4/26: Required just one dose of nibmex last night for vent dyssynchrony. 4/27: moving spontaneously and with some purpose but not following commands, not tracking.  4/28: largely unchanged neuro exam, worsening AKI; will initiate HD 4/29: following simple commands, attempting to wean sedation 4/30: plan for HD again today, BUN 130. Extubated  5/1: Stable for ICU down grade to SDU   Objective   Blood pressure 130/70, pulse 67, temperature 97.7 F (36.5 C), temperature source Oral, resp. rate 15, height 5\' 1"  (1.549 m), weight 91.9 kg, SpO2 99 %.    Vent Mode: BIPAP FiO2 (%):  [30 %-50 %] 40 % Set Rate:  [10 bmp-16 bmp] 16 bmp PEEP:  [5 cmH20] 5 cmH20 Pressure Support:  [5 cmH20] 5 cmH20   Intake/Output Summary (Last 24 hours) at 05/19/2018 0956 Last data filed at 05/19/2018 0800 Gross per 24 hour  Intake 564.06 ml  Output 1350 ml  Net -785.94 ml   Filed Weights   05/18/18 0506 05/19/18 0446 05/19/18 0730  Weight: 91 kg 93 kg 91.9 kg    Examination: General: chronically ill appearing, comfortable in bed, confused but easily redirectable  HEENT: NCAT, tracking appropriately  Neuro: following commands, no focal deficit  CV: RRR, no mrg  PULM: CTAB, no wheezes, good air entry  GI: Soft, NT ND  Extremities: no edema Skin: no rash    Assessment & Plan:   Acute on Chronic Hypoxemic Hypercarbic Respiratory Failure  - Wean Fio2 to maintain SpO2 >88% - She needs BIPAP QHS and NAPs - discussed with TR[facility] Dr. Sherral Hammers who has agreed to take patient for transfer to SDU  tomorrow   S/p cardiac arrest: initial rhythm unknown presumed  PEA - presumed respiratory arrest.  - stable, supportive care   Septic shock, resolved  Presumed aspiration event pneumonia, no CXR infiltrate H/o COPD on Advair, Spiriva and Daliresp uses a cpap w/ oxygen at night COPD exacerbation H. Flu pneumonia, completed Abx  -steroids IV 40mg  daily, can likely drop to orals  - scheduled bronchodilators   Acute encephalopathy s/p cardiac arrest AKI, ARF now on iHD, Uremia probable component of encephalopthy, BUN elevated from steroids  Hyperkalemia - RUS w/o retention or hydronephrosis. Nephrology on board; started [facility] 4/28 with [facility]Th[facility] schedule. - dialysis per nephrology  - repeat BMET following dialysis this morning to ensure hyperk better  - lots of medication effect likely still on board due to renal failure - avoid drugs that will be too oversedating   Elevated Transaminases In setting of cardiac arrest with unknown down time; likely represents shock  liver. Trending down currently. - supportive care, will follow    Best practice:  Diet: TF Pain/Anxiety/Delirium protocol (if indicated): precedex, fentanyl VAP protocol (if indicated): yes DVT prophylaxis: hep [place] and SCDs GI prophylaxis: Protonix Glucose control: if BG exceeds 180 mg/dl  Mobility: bedrest Code Status: Full code  Family Communication: updated 4/29 Disposition: ICU  Labs   CBC: Recent Labs  Lab 05/12/18 2135  05/14/18 0102  05/15/18 0229 05/16/18 0504 05/17/18 0538 05/18/18 0314 05/18/18 1236 05/18/18 2242 05/19/18 0226 05/19/18 0454  WBC 11.5*  --  12.8*  --  22.2*  23.8* 22.4* 17.0* 19.4*  --   --  16.0*  --   NEUTROABS 8.3*  --  11.3*  --  18.9*  --   --   --   --   --   --   --   HGB 13.3   < > 11.5*   < > 11.1*  11.1* 11.3* 11.1* 11.1* 10.5* 10.2* 10.6* 9.9*  HCT 46.5*   < > 37.8   < > 35.8*  34.4* 34.2* 35.0* 34.5* 31.0* 30.0* 33.0* 29.0*  MCV 105.7*  --  97.4  --  95.5  95.0 91.9  93.8 92.7  --   --  92.7  --   PLT 294  --  214  --  217  203 203 164 145*  --   --  131*  --    < > = values in this interval not displayed.    Basic Metabolic Panel: Recent Labs  Lab 05/14/18 7253  05/16/18 0504 05/17/18 6644 05/18/18 0314 05/18/18 1236 05/18/18 2242 05/18/18 2326 05/19/18 0226 05/19/18 0454 05/19/18 0523  NA 140   < > 139 138 136 136 134* 137 138 134*  --   K 3.8   < > 3.7 4.5 5.0 4.4 6.0* 6.0* 6.0* 6.0* 6.1*  CL 94*   < > 92* 95* 97*  --   --  97* 95*  --   --   CO2 31   < > 28 28 26   --   --  25 24  --   --   GLUCOSE 137*   < > 166* 142* 144*  --   --  125* 125*  --   --   BUN 51*   < > 133* 106* 141*  --   --  153* 163*  --   --   CREATININE 3.29*   < > 6.03* 5.07* 6.05*  --   --  6.95* 7.02*  --   --   CALCIUM 8.6*   < > 8.9 8.3* 8.6*  --   --  8.5* 8.9  --   --   MG 2.6*  --  2.8* 2.3 2.5*  --   --   --  2.6*  --   --   PHOS 3.7  --  2.5 3.7 4.7*  --   --   --  6.8*  --   --    < > = values in this interval not displayed.   GFR: Estimated Creatinine Clearance: 10.1 mL/min (A) (by C-G formula based on SCr of 7.02 mg/dL (H)). Recent Labs  Lab 05/12/18 2135 05/13/18 0034  05/15/18 0816 05/15/18 0347 05/16/18 0504 05/17/18 0538 05/18/18 0314 05/19/18 0226  PROCALCITON 0.68  --   --  9.04  --  7.36  --   --   --   WBC 11.5*  --    < >  --   --  22.4* 17.0* 19.4* 16.0*  LATICACIDVEN 7.7* 3.1*  --   --  0.8  --   --   --   --    < > = values in this interval not displayed.    Liver Function Tests: Recent Labs  Lab 05/15/18 0229 05/16/18 0504 05/17/18 0538 05/18/18 0314 05/19/18 0226  AST 1,441* 483* 178* 89* 64*  ALT 2,892* 2,195* 1,461* 1,017* 748*  ALKPHOS 136* 122 100 88 81  BILITOT 1.0 0.7 0.4 0.5 0.8  PROT 6.3* 6.1* 5.9* 5.7* 5.6*  ALBUMIN 2.4* 2.6* 2.4* 2.3* 2.5*  2.4*   No results for input(s): LIPASE, AMYLASE in the last 168 hours. Recent Labs  Lab 05/14/18 1326 05/17/18 0700  AMMONIA 55* 37*    ABG    Component  Value Date/Time   PHART 7.307 (L) 05/19/2018 0454   PCO2ART 50.1 (H) 05/19/2018 0454   PO2ART 121.0 (H) 05/19/2018 0454   HCO3 25.2 05/19/2018 0454   TCO2 27 05/19/2018 0454   ACIDBASEDEF 2.0 05/19/2018 0454   O2SAT 98.0 05/19/2018 0454     Coagulation Profile: No results for input(s): INR, PROTIME in the last 168 hours.  Cardiac Enzymes: Recent Labs  Lab 05/14/18 1326 05/19/18 0226  CKTOTAL 926* 276*    HbA1C: No results found for: HGBA1C  CBG: Recent Labs  Lab 05/18/18 1208 05/18/18 1600 05/18/18 1934 05/18/18 2342 05/19/18 0350  GLUCAP 107* 88 115* 124* 118*    This patient is critically ill with multiple organ system failure; which, requires frequent high complexity decision making, assessment, support, evaluation, and titration of therapies. This was completed through the application of advanced monitoring technologies and extensive interpretation of multiple databases. During this encounter critical care time was devoted to patient care services described in this note for 32 minutes.   Garner Nash, DO [facility] 05/19/2018 9:56 AM  Personal pager: [phone] If unanswered, please page CCM On-call: [phone]

## 2018-05-19 NOTE — Progress Notes (Signed)
eLink Physician-Brief Progress Note Patient Name: Misty Green DOB: [birth year] MRN: [id number]   Date of Service  05/19/2018  HPI/Events of Note  K+ 6.0  eICU Interventions  Hyperkalemia focused orders entered        Frederik Pear 05/19/2018, 1:27 AM

## 2018-05-19 NOTE — Progress Notes (Signed)
Rehab Admissions Coordinator Note:  Patient was screened by Cleatrice Burke for appropriateness for an Inpatient Acute Rehab Consult per PT recs.   At this time, we are recommending Inpatient Rehab consult. Please place order for consult.  Cleatrice Burke RN MSN 05/19/2018, 4:41 PM  I can be reached at [phone].

## 2018-05-19 NOTE — Progress Notes (Signed)
Attempted to assist with video visit with pt's son.  Sent text to pt's son twice, but then son told bedside RN that he would just wait and talk with her tomorrow.

## 2018-05-19 NOTE — Progress Notes (Signed)
Assisted tele visit to patient with husband.  Misty Green, Aliene Beams, RN

## 2018-05-19 NOTE — Progress Notes (Signed)
RT obtained ABG on pt with the following results. Pt taken off of BIPAP and place on 4 Lpm [place] with humidification. RT will continue to monitor.   Results for Misty Green, Misty Green (MRN [id number]) as of 05/19/2018 05:00  Ref. Range 05/19/2018 04:54  Sample type Unknown ARTERIAL  pH, Arterial Latest Ref Range: 7.350 - 7.450  7.307 (L)  pCO2 arterial Latest Ref Range: 32.0 - 48.0 mmHg 50.1 (H)  pO2, Arterial Latest Ref Range: 83.0 - 108.0 mmHg 121.0 (H)  TCO2 Latest Ref Range: 22 - 32 mmol/L 27  Acid-base deficit Latest Ref Range: 0.0 - 2.0 mmol/L 2.0  Bicarbonate Latest Ref Range: 20.0 - 28.0 mmol/L 25.2  O2 Saturation Latest Units: % 98.0  Patient temperature Unknown 97.7 F  Collection site Unknown BRACHIAL ARTERY

## 2018-05-19 NOTE — Evaluation (Addendum)
Clinical/Bedside Swallow Evaluation Patient Details  Name: Misty Green MRN: [id number] Date of Birth: 10-05-1970  Today's Date: 05/19/2018 Time: SLP Start Time (ACUTE ONLY): 0907 SLP Stop Time (ACUTE ONLY): 0930 SLP Time Calculation (min) (ACUTE ONLY): 23 min  Past Medical History:  Past Medical History:  Diagnosis Date  . CHF (congestive heart failure) ([place])   . COPD (chronic obstructive pulmonary disease) ([place])   . Current smoker   . Opiate abuse, continuous ([place])    Past Surgical History:  Past Surgical History:  Procedure Laterality Date  . ABDOMINAL HYSTERECTOMY    . BACK SURGERY    . cesction    . COLOSTOMY  01/13/2018   Procedure: COLOSTOMY Creation;  Surgeon: Jules Husbands, MD;  Location: [facility] ORS;  Service: General;;  . INCISIONAL HERNIA REPAIR     lower midline laparotomy incision (hysterectomy), repaired with mesh  . LAPAROSCOPIC CHOLECYSTECTOMY  2012  . LAPAROSCOPIC LYSIS OF ADHESIONS  01/13/2018   Procedure: LAPAROSCOPIC LYSIS OF ADHESIONS;  Surgeon: Jules Husbands, MD;  Location: [facility] ORS;  Service: General;;  . LAPAROSCOPIC SIGMOID COLECTOMY  01/13/2018   Procedure: LAPAROSCOPIC SIGMOID COLECTOMY;  Surgeon: Jules Husbands, MD;  Location: [facility] ORS;  Service: General;;  . ORIF WRIST FRACTURE Right 08/25/2015   Procedure: OPEN REDUCTION INTERNAL FIXATION (ORIF) WRIST FRACTURE;  Surgeon: Corky Mull, MD;  Location: [facility] ORS;  Service: Orthopedics;  Laterality: Right;   HPI:  Pt is a 48 yo female who presents s/p cardiac arrest (downtime unknown). ETT 4/24-4/30. MRI Brain 4/26 negative for acute changes. PMHx COPD, CHF, chronic back pain from spinal stenoses, diverticulosis s/p colostomy, anxiety   Assessment / Plan / Recommendation Clinical Impression  Pt has consistent coughing with cup sips of thin liquids concerning for reduced airway protection after prolonged intubation. Her swallow appears swift and functional with no overt signs of aspiration given large  sips of nectar thick liquids and purees. She is very distractible and shows reduced working memory, which does impact her efficiency with masticating and clearing soft solids from her mouth. Mod cues were given across trials to not talk with her mouth full, continue mastication efforts. Recommend Dys 2 diet and nectar thick liquids. Will f/u for readiness to advance, with good prognosis anticipated. Would consider ordering SLP cognitive evaluation. SLP Visit Diagnosis: Dysphagia, unspecified (R13.10)    Aspiration Risk  Mild aspiration risk    Diet Recommendation Dysphagia 2 (Fine chop);Nectar-thick liquid   Liquid Administration via: Cup Medication Administration: Whole meds with puree Supervision: Patient able to self feed;Intermittent supervision to cue for compensatory strategies;Comment(set-up assist, close supervision) Compensations: Minimize environmental distractions;Slow rate;Small sips/bites Postural Changes: Seated upright at 90 degrees    Other  Recommendations Oral Care Recommendations: Oral care BID Other Recommendations: Order thickener from pharmacy;Prohibited food (jello, ice cream, thin soups);Remove water pitcher;Have oral suction available   Follow up Recommendations Inpatient Rehab      Frequency and Duration min 2x/week  2 weeks       Prognosis Prognosis for Safe Diet Advancement: Good Barriers to Reach Goals: Cognitive deficits      Swallow Study   General HPI: Pt is a 48 yo female who presents s/p cardiac arrest (downtime unknown). ETT 4/24-4/30. MRI Brain 4/26 negative for acute changes. PMHx COPD, CHF, chronic back pain from spinal stenoses, diverticulosis s/p colostomy, anxiety Type of Study: Bedside Swallow Evaluation Previous Swallow Assessment: none in chart Diet Prior to this Study: NPO Temperature Spikes Noted: No Respiratory Status: Nasal  cannula History of Recent Intubation: Yes Length of Intubations (days): 7 days Date extubated:  05/18/18 Behavior/Cognition: Alert;Cooperative;Confused;Distractible;Requires cueing Oral Cavity Assessment: Within Functional Limits Oral Care Completed by SLP: No Oral Cavity - Dentition: Adequate natural dentition Vision: Functional for self-feeding Self-Feeding Abilities: Able to feed self Patient Positioning: Upright in bed Baseline Vocal Quality: Normal Volitional Swallow: Able to elicit    Oral/Motor/Sensory Function Overall Oral Motor/Sensory Function: (question deviation of uvula but difficulty visualizing)   Ice Chips Ice chips: Within functional limits Presentation: Spoon   Thin Liquid Thin Liquid: Impaired Presentation: Cup;Self Fed;Spoon Pharyngeal  Phase Impairments: Cough - Immediate;Cough - Delayed    Nectar Thick Nectar Thick Liquid: Within functional limits Presentation: Cup;Self Fed   Honey Thick Honey Thick Liquid: Not tested   Puree Puree: Within functional limits Presentation: Self Fed;Spoon   Solid     Solid: Impaired Presentation: Self Fed Oral Phase Impairments: Impaired mastication Oral Phase Functional Implications: Impaired mastication;Oral residue      Misty Green 05/19/2018,9:40 AM  [name], M.A. [place] Acute Environmental education officer [phone] Office [phone]

## 2018-05-19 NOTE — Progress Notes (Deleted)
eLink Physician-Brief Progress Note Patient Name: Misty Green DOB: [birth year] MRN: [id number]   Date of Service  05/19/2018  HPI/Events of Note  Bedside RN requesting additional medication for sedation/ anxiolysis prior to dialysis this AM.  eICU Interventions  I did camera in and observed patient for a few minutes as the dialysis nurse prepared to hook her up and she appears calm. Will defer additional medications at this time.        Kerry Kass Zakiyah Diop 05/19/2018, 5:59 AM

## 2018-05-19 NOTE — Evaluation (Signed)
Occupational Therapy Evaluation Patient Details Name: Misty Green MRN: [id number] DOB: 16-Jul-1970 Today's Date: 05/19/2018    History of Present Illness 48 yr old F w/ PMHx COPD, CHF, chronic back pain from spinal stenoses, diverticulosis s/p colostomy presents s/p cardiac arrest.    Clinical Impression   PTA Pt was independent in ADL and mobility. Today Pt was min A to max A +2 for ADL. Cognition impacting ability to participate and complete ADL as she requires cues, redirection, and assist for balance. Pt was able to perform bed mobility at min guard with cues, and pivot to chair with mod A +2 assist. Pt will require skilled OT in the acute setting and afterwards at the CIR level to maximize safety and independence in ADL and functional transfers to return to PLOF.      Follow Up Recommendations  CIR;Supervision/Assistance - 24 hour    Equipment Recommendations  Other (comment)(defer to next venue of care)    Recommendations for Other Services       Precautions / Restrictions Precautions Precautions: Fall Restrictions Weight Bearing Restrictions: No      Mobility Bed Mobility Overal bed mobility: Needs Assistance Bed Mobility: Supine to Sit     Supine to sit: Min guard     General bed mobility comments: needed min guard due to impulsivity  Transfers Overall transfer level: Needs assistance Equipment used: 2 person hand held assist Transfers: Sit to/from [facility] Sit to Stand: Min assist;+2 physical assistance;Mod assist Stand pivot transfers: Min assist;Mod assist;+2 physical assistance       General transfer comment: Pt was able to stand to feet with mod assist and cues with unsteadiness noted due to lack of coordination as well as knee buckling on first attempt.  Second attempt pt was able to step around to recliner.     Balance Overall balance assessment: Needs assistance Sitting-balance support: No upper extremity supported;Feet  supported Sitting balance-Leahy Scale: Fair     Standing balance support: Bilateral upper extremity supported;During functional activity Standing balance-Leahy Scale: Poor Standing balance comment: bil UE suport and external support needed                           ADL either performed or assessed with clinical judgement   ADL Overall ADL's : Needs assistance/impaired Eating/Feeding: NPO   Grooming: Minimal assistance;Sitting   Upper Body Bathing: Minimal assistance   Lower Body Bathing: Maximal assistance   Upper Body Dressing : Minimal assistance   Lower Body Dressing: Maximal assistance Lower Body Dressing Details (indicate cue type and reason): to don socks Toilet Transfer: Moderate assistance;+2 for physical assistance;+2 for safety/equipment;Stand-pivot;BSC Toilet Transfer Details (indicate cue type and reason): face to face transfer [place] and Hygiene: Maximal assistance Toileting - Clothing Manipulation Details (indicate cue type and reason): Pt with colostomy bag     Functional mobility during ADLs: Moderate assistance;+2 for physical assistance;+2 for safety/equipment General ADL Comments: confusion/cognition, and decreased access to LB for ADL, decreased balance     Vision         Perception     Praxis      Pertinent Vitals/Pain Pain Assessment: No/denies pain     Hand Dominance Right   Extremity/Trunk Assessment Upper Extremity Assessment Upper Extremity Assessment: Generalized weakness;Difficult to assess due to impaired cognition   Lower Extremity Assessment Lower Extremity Assessment: Defer to PT evaluation RLE Coordination: decreased gross motor LLE Coordination: decreased gross motor  Cervical / Trunk Assessment Cervical / Trunk Assessment: Normal   Communication Communication Communication: Expressive difficulties   Cognition Arousal/Alertness: Awake/alert Behavior During Therapy:  Restless;Impulsive Overall Cognitive Status: Impaired/Different from baseline Area of Impairment: Orientation;Memory;Following commands;Safety/judgement;Awareness;Problem solving                 Orientation Level: Disoriented to;Situation;Time;Place   Memory: Decreased short-term memory;Decreased recall of precautions Following Commands: Follows one step commands inconsistently;Follows one step commands with increased time Safety/Judgement: Decreased awareness of safety;Decreased awareness of deficits   Problem Solving: Slow processing;Decreased initiation;Difficulty sequencing;Requires verbal cues;Requires tactile cues General Comments: "what is going on?" Easily distracted which nurse states daughter stated was premorbid   General Comments  VSS    Exercises Exercises: General Lower Extremity General Exercises - Lower Extremity Long Arc Quad: AROM;Both;10 reps;Seated   Shoulder Instructions      Home Living Family/patient expects to be discharged to:: Private residence Living Arrangements: Spouse/significant other Available Help at Discharge: Family;Neighbor;Available PRN/intermittently Type of Home: House Home Access: Stairs to enter [employer] of Steps: 5 back; 4 in front Entrance Stairs-Rails: Can reach both Home Layout: One level     Bathroom Shower/Tub: Teacher, early years/pre: Handicapped height     Home Equipment: None          Prior Functioning/Environment Level of Independence: Independent        Comments: Independent with all ADL/IADL tasks and mobility; takes care of her 3 grandchildren per admit in 2019        OT Problem List: Decreased activity tolerance;Impaired balance (sitting and/or standing);Decreased coordination;Decreased cognition;Decreased safety awareness;Decreased knowledge of use of DME or AE;Cardiopulmonary status limiting activity;Obesity      OT Treatment/Interventions: Self-care/ADL training;DME and/or  AE instruction;Therapeutic activities;Cognitive remediation/compensation;Patient/family education;Balance training    OT Goals(Current goals can be found in the care plan section) Acute Rehab OT Goals Patient Stated Goal: to go home OT Goal Formulation: With patient Time For Goal Achievement: 06/02/18 Potential to Achieve Goals: Good ADL Goals Pt Will Perform Grooming: with supervision;standing Pt Will Perform Upper Body Dressing: with modified independence;sitting Pt Will Perform Lower Body Dressing: with modified independence;sit to/from stand Pt Will Transfer to Toilet: with modified independence;ambulating Pt Will Perform Toileting - Clothing Manipulation and hygiene: with modified independence;sit to/from stand Additional ADL Goal #1: Pt will perform bed mobility prior to engaging in ADL at mod I level  OT Frequency: Min 3X/week   Barriers to D/C:            Co-evaluation PT/OT/SLP Co-Evaluation/Treatment: Yes Reason for Co-Treatment: Complexity of the patient's impairments (multi-system involvement);For patient/therapist safety PT goals addressed during session: Mobility/safety with mobility;Balance OT goals addressed during session: ADL's and self-care      AM-PAC OT "6 Clicks" Daily Activity     Outcome Measure Help from another person eating meals?: Total(NPO) Help from another person taking care of personal grooming?: A Lot Help from another person toileting, which includes using toliet, bedpan, or urinal?: A Lot Help from another person bathing (including washing, rinsing, drying)?: A Lot Help from another person to put on and taking off regular upper body clothing?: A Lot Help from another person to put on and taking off regular lower body clothing?: A Lot 6 Click Score: 11   End of Session Equipment Utilized During Treatment: Gait belt;Oxygen Nurse Communication: Mobility status  Activity Tolerance: Patient tolerated treatment well Patient left: in chair;with  call bell/phone within reach;with chair alarm set  OT Visit Diagnosis: Unsteadiness on  feet (R26.81);Other abnormalities of gait and mobility (R26.89);Muscle weakness (generalized) (M62.81);Other symptoms and signs involving cognitive function                Time: 1555-1610 OT Time Calculation (min): 15 min Charges:  OT General Charges $OT Visit: 1 Visit OT Evaluation $OT Eval Moderate Complexity: [place] OTR/L Acute Rehabilitation Services Pager: [phone] Office: [place] 05/19/2018, 5:29 PM

## 2018-05-19 NOTE — Progress Notes (Signed)
Patient continued to be very restless and anxious trying to remove bipap mask while on bipap has no IV PRN medications,since patient is NPO  Spoke with eLink to request IV medication to replace her missed dose of xanax   No new orders given at this time

## 2018-05-20 DIAGNOSIS — N186 End stage renal disease: Secondary | ICD-10-CM

## 2018-05-20 DIAGNOSIS — Z992 Dependence on renal dialysis: Secondary | ICD-10-CM

## 2018-05-20 LAB — GLUCOSE, CAPILLARY
Glucose-Capillary: 139 mg/dL — ABNORMAL HIGH (ref 70–99)
Glucose-Capillary: 140 mg/dL — ABNORMAL HIGH (ref 70–99)
Glucose-Capillary: 65 mg/dL — ABNORMAL LOW (ref 70–99)
Glucose-Capillary: 74 mg/dL (ref 70–99)
Glucose-Capillary: 78 mg/dL (ref 70–99)
Glucose-Capillary: 85 mg/dL (ref 70–99)
Glucose-Capillary: 91 mg/dL (ref 70–99)

## 2018-05-20 LAB — BASIC METABOLIC PANEL
Anion gap: 16 — ABNORMAL HIGH (ref 5–15)
BUN: 120 mg/dL — ABNORMAL HIGH (ref 6–20)
CO2: 25 mmol/L (ref 22–32)
Calcium: 8.8 mg/dL — ABNORMAL LOW (ref 8.9–10.3)
Chloride: 96 mmol/L — ABNORMAL LOW (ref 98–111)
Creatinine, Ser: 6.25 mg/dL — ABNORMAL HIGH (ref 0.44–1.00)
GFR calc Af Amer: 8 mL/min — ABNORMAL LOW (ref >60)
GFR calc non Af Amer: 7 mL/min — ABNORMAL LOW (ref >60)
Glucose, Bld: 118 mg/dL — ABNORMAL HIGH (ref 70–99)
Potassium: 5.3 mmol/L — ABNORMAL HIGH (ref 3.5–5.1)
Sodium: 137 mmol/L (ref 135–145)

## 2018-05-20 LAB — BLOOD GAS, ARTERIAL
Acid-base deficit: 1.1 mmol/L (ref 0.0–2.0)
Bicarbonate: 25.3 mmol/L (ref 20.0–28.0)
Delivery systems: POSITIVE
Drawn by: 246101
Expiratory PAP: 5
FIO2: 40
Inspiratory PAP: 15
Mode: POSITIVE
O2 Saturation: 97.6 %
Patient temperature: 98
RATE: 16 resp/min
pCO2 arterial: 59.8 mmHg — ABNORMAL HIGH (ref 32.0–48.0)
pH, Arterial: 7.248 — ABNORMAL LOW (ref 7.350–7.450)
pO2, Arterial: 119 mmHg — ABNORMAL HIGH (ref 83.0–108.0)

## 2018-05-20 LAB — CBC
HCT: 34.3 % — ABNORMAL LOW (ref 36.0–46.0)
Hemoglobin: 10.8 g/dL — ABNORMAL LOW (ref 12.0–15.0)
MCH: 29.8 pg (ref 26.0–34.0)
MCHC: 31.5 g/dL (ref 30.0–36.0)
MCV: 94.5 fL (ref 80.0–100.0)
Platelets: 162 10*3/uL (ref 150–400)
RBC: 3.63 MIL/uL — ABNORMAL LOW (ref 3.87–5.11)
RDW: 14 % (ref 11.5–15.5)
WBC: 15.7 10*3/uL — ABNORMAL HIGH (ref 4.0–10.5)
nRBC: 0 % (ref 0.0–0.2)

## 2018-05-20 LAB — AMMONIA: Ammonia: 35 umol/L (ref 9–35)

## 2018-05-20 LAB — MAGNESIUM: Magnesium: 2.4 mg/dL (ref 1.7–2.4)

## 2018-05-20 MED ORDER — HEPARIN SODIUM (PORCINE) 1000 UNIT/ML IJ SOLN
INTRAMUSCULAR | Status: AC
  Filled 2018-05-20: qty 3

## 2018-05-20 MED ORDER — ALPRAZOLAM 0.5 MG PO TABS
0.5000 mg | ORAL_TABLET | Freq: Three times a day (TID) | ORAL | Status: DC
  Administered 2018-05-20 – 2018-05-21 (×3): 0.5 mg via ORAL
  Filled 2018-05-20 (×3): qty 1

## 2018-05-20 NOTE — Progress Notes (Signed)
SLP Cancellation Note  Patient Details Name: Misty Green MRN: [id number] DOB: 1970-03-11   Cancelled treatment:       Reason Eval/Treat Not Completed: Patient's level of consciousness. Per RN patient is more confused today and more difficulty with participating in care/PO's. Will check in next 1-2 days.   Nadara Mode Tarrell 05/20/2018, 4:59 PM   Sonia Baller, MA, CCC-SLP Speech Therapy [facility] Acute Rehab Pager: [phone]

## 2018-05-20 NOTE — Progress Notes (Signed)
Pt arrived to floor around 2050 from HD.... Pt is alert and follows commands.... Orientation/Cognotve questionable.... Alert to person and knows the president of the [place] but is babbling incoherent things.... will continue to monitor pt closely.

## 2018-05-20 NOTE — Progress Notes (Signed)
Pt placed on Bipap asper order.... Bilateral wrist restraints are off d/t Bipap

## 2018-05-20 NOTE — Progress Notes (Signed)
PROGRESS NOTE    Misty Green  WJX:[id number] DOB: [birth year] DOA: 05/12/2018 PCP: Sinda Du, MD   Brief Narrative:  48 yr old WF PMHx anxiety, depression, COPD, OSA/OHS, tobacco abuse, CHF, chronic back pain from spinal stenoses, diverticulosis s/p colostomy presents s/p Cardiac arrest.  Opioid dependence/abuse PCCM consulted for admission.   Her baseline is awake alert oriented x 4 fully functional with iADLS per her daughter she is not very compliant with her medications, she last saw her primary doctor almost a yr ago per her husband.  She uses a CPAP at night which she received 3L O2 through but does not require supplemental oxygen during awake and active periods.   Per her husband the patient started feeling ill ( symptoms included: cough, nasal congestion, occasional SOB) on 05/07/2018 her symptoms became worse on Wednesday 4/22  She complained of a headache accompanied by nausea and vomiting unable to tolerate oral intake. On Thursday 4/23 she continued to have the headache despite tylenol but was drinking fluids, and had a 1/2 can soup and some crackers.  The morning of 05/12/18 she complained of hot and cold flashes. She took tylenol throughout the week for these symptoms. [name] does not know how much she took.   When he found her unresponsive at 8 pm he called 911 immediately he states that he cleared the bed and laid her flat shortly after she began to vomit. He was instructed to turn her on her side which he did. He states that he believes it was  5 mins between the time 911 was called and when Fire arrived.   Downtime unknown: in the EMR it is listed as 13-14 min code but upon further investigation  per husband states that the pt had been "sleeping" all day and he does not have a definitive time. He found her unresponsive at 8 pm but He last spoke to her at noon and she verbally responded at that time and went back to sleep.   Daughter states that she doesn't live with he mother  but spoke to her via telephone throughout the week up until 4/23 in the evening and her mom denied any complaints to her. She states that her mother had a cardiac arrest back when she was pregnant with her brother and was diagnosed with peripartum cardiomyopathy. She is supposed to be on lasix which she doesn't take.   She presents on 4/25 to [facility] s/p cardiac arrest (initial rhythm unknown). King airway was placed by EMS and she was started on Epi gtt. Intubated in the ED received RSI at 2120. COVID negative off of vasopressors. PCCM consulted.   Subjective: 5/2 A/O x0, babbles incoherently most of the time.  Other times speaks full sentences but does not make sense.  Did asked to go home stating her husband will come and pick her up.   Assessment & Plan:   Active Problems:   COPD exacerbation (HCC)   OSA on CPAP   Cardiac arrest (HCC)   Lactic acidosis   Transaminitis   Acute on chronic respiratory failure with hypercapnia (HCC)   Acute on chronic respiratory failure with hypoxia and hypercapnia - Titrate O2/BiPAP to maintain SPO2 89 - 93%. - BiPAP Naps /QHS  Cardiac arrest/presumed PEA - Presumed secondary to respiratory arrest - Appears patient was down for significant amount of time before found by her husband see above H&P - CPR ~15 minutes before ROSC - Echocardiogram show retained EF see results below  Aspiration pneumonia/H  flu pneumonia - Completed course of antibiotics  COPD exacerbation - Albuterol neb as needed -Pulmicort nebulizer twice daily -DuoNeb QID -Solu-Medrol 40 mg daily   OSA/OHS? -Uses CPAP at night   Septic shock, -Resolved    Acute encephalopathy s/p cardiac arrest -Multifactorial uremia, hypercarbia, anoxic brain injury?  Medication - Correct underlying problems -Decrease Xanax to 0.5 mg TID  Acute renal failure now on intermittent HD T/Th/Sat -Started on 4/28 - HD per nephrology - 5/2 patient requires HD secondary to uremia -Avoid  nephrotoxic medication -Avoid/limit sedating medication secondary to poor renal clearance  Hyperkalemia - Monitor closely - Patient due for HD today  Elevated Transaminases -Most likely secondary to cardiac arrest --> shock liver -Monitor intermittently to ensure trending down -Supportive care     DVT prophylaxis: Heparin subcu Code Status: Full Family Communication: None Disposition Plan: TBD   Consultants:  PCCM Nephrology    Procedures/Significant Events:  4/30 echocardiogram;"-LVEF = 60-65%. Left ventricular diastolic function could not be evaluated.    I have personally reviewed and interpreted all radiology studies and my findings are as above.  VENTILATOR SETTINGS:    Cultures 4/24 Blood: ngtd 4/24 covid: NEG 4/24 RVP: neg 4/24 resp: Positive H. influenzae   Antimicrobials: Anti-infectives (From admission, onward)   Start     Stop   05/15/18 0800  ceFEPIme (MAXIPIME) 1 g in sodium chloride 0.9 % 100 mL IVPB  Status:  Discontinued     05/17/18 0954   05/13/18 1400  ceFEPIme (MAXIPIME) 2 g in sodium chloride 0.9 % 100 mL IVPB  Status:  Discontinued     05/14/18 1242   05/13/18 0315  metroNIDAZOLE (FLAGYL) IVPB 500 mg  Status:  Discontinued     05/14/18 1100   05/13/18 0230  azithromycin (ZITHROMAX) 500 mg in sodium chloride 0.9 % 250 mL IVPB     05/13/18 0456   05/12/18 2230  ceFEPIme (MAXIPIME) 2 g in sodium chloride 0.9 % 100 mL IVPB     05/12/18 2338   05/12/18 2230  vancomycin (VANCOCIN) 1,500 mg in sodium chloride 0.9 % 500 mL IVPB  Status:  Discontinued     05/12/18 2342       Devices   LINES / TUBES:      Continuous Infusions: . sodium chloride    . sodium chloride       Objective: Vitals:   05/20/18 0500 05/20/18 0600 05/20/18 0800 05/20/18 0801  BP: (!) 156/82 134/90    Pulse: 96 (!) 119    Resp: 15 20    Temp:      TempSrc:      SpO2: 100% 95% 97% 93%  Weight:      Height:        Intake/Output Summary (Last 24  hours) at 05/20/2018 0815 Last data filed at 05/20/2018 0100 Gross per 24 hour  Intake 419.92 ml  Output 0 ml  Net 419.92 ml   Filed Weights   05/19/18 0446 05/19/18 0730 05/20/18 0413  Weight: 93 kg 91.9 kg 91 kg    Examination:  General: A/O x0, talks incomprehensibly though at times makes statements which are irrelevant to question asked, will follow some commands positive cute respiratory distress Eyes: negative scleral hemorrhage, negative anisocoria, negative icterus ENT: Negative Runny nose, negative gingival bleeding, Neck:  Negative scars, masses, torticollis, lymphadenopathy, JVD Lungs: Tachypneic, clear to auscultation bilaterally without wheezes or crackles Cardiovascular: Tachycardic, without murmur gallop or rub normal S1 and S2 Abdomen: negative abdominal pain, nondistended,  positive soft, bowel sounds, no rebound, no ascites, no appreciable mass Extremities: No significant cyanosis, clubbing, or edema bilateral lower extremities Skin: Negative rashes, lesions, ulcers Psychiatric: Unable to evaluate secondary to altered mental status   Central nervous system:  Cranial nerves II through XII intact, tongue/uvula midline, moves all extremities spontaneously negative dysarthria,  .     Data Reviewed: Care during the described time interval was provided by me .  I have reviewed this patient's available data, including medical history, events of note, physical examination, and all test results as part of my evaluation.   CBC: Recent Labs  Lab 05/14/18 0652  05/15/18 0229 05/16/18 0504 05/17/18 0538 05/18/18 0314 05/18/18 1236 05/18/18 2242 05/19/18 0226 05/19/18 0454  WBC 12.8*  --  22.2*  23.8* 22.4* 17.0* 19.4*  --   --  16.0*  --   NEUTROABS 11.3*  --  18.9*  --   --   --   --   --   --   --   HGB 11.5*   < > 11.1*  11.1* 11.3* 11.1* 11.1* 10.5* 10.2* 10.6* 9.9*  HCT 37.8   < > 35.8*  34.4* 34.2* 35.0* 34.5* 31.0* 30.0* 33.0* 29.0*  MCV 97.4  --  95.5  95.0  91.9 93.8 92.7  --   --  92.7  --   PLT 214  --  217  203 203 164 145*  --   --  131*  --    < > = values in this interval not displayed.   Basic Metabolic Panel: Recent Labs  Lab 05/14/18 2671  05/16/18 0504 05/17/18 2458 05/18/18 0314  05/18/18 2242 05/18/18 2326 05/19/18 0226 05/19/18 0454 05/19/18 0523 05/19/18 1215  NA 140   < > 139 138 136   < > 134* 137 138 134*  --  137  K 3.8   < > 3.7 4.5 5.0   < > 6.0* 6.0* 6.0* 6.0* 6.1* 5.1  CL 94*   < > 92* 95* 97*  --   --  97* 95*  --   --  95*  CO2 31   < > 28 28 26   --   --  25 24  --   --  25  GLUCOSE 137*   < > 166* 142* 144*  --   --  125* 125*  --   --  166*  BUN 51*   < > 133* 106* 141*  --   --  153* 163*  --   --  97*  CREATININE 3.29*   < > 6.03* 5.07* 6.05*  --   --  6.95* 7.02*  --   --  5.01*  CALCIUM 8.6*   < > 8.9 8.3* 8.6*  --   --  8.5* 8.9  --   --  8.9  MG 2.6*  --  2.8* 2.3 2.5*  --   --   --  2.6*  --   --   --   PHOS 3.7  --  2.5 3.7 4.7*  --   --   --  6.8*  --   --   --    < > = values in this interval not displayed.   GFR: Estimated Creatinine Clearance: 14.1 mL/min (A) (by C-G formula based on SCr of 5.01 mg/dL (H)). Liver Function Tests: Recent Labs  Lab 05/15/18 0229 05/16/18 0504 05/17/18 0538 05/18/18 0314 05/19/18 0226  AST 1,441* 483* 178* 89* 64*  ALT  2,892* 2,195* 1,461* 1,017* 748*  ALKPHOS 136* 122 100 88 81  BILITOT 1.0 0.7 0.4 0.5 0.8  PROT 6.3* 6.1* 5.9* 5.7* 5.6*  ALBUMIN 2.4* 2.6* 2.4* 2.3* 2.5*  2.4*   No results for input(s): LIPASE, AMYLASE in the last 168 hours. Recent Labs  Lab 05/14/18 1326 05/17/18 0700  AMMONIA 55* 37*   Coagulation Profile: No results for input(s): INR, PROTIME in the last 168 hours. Cardiac Enzymes: Recent Labs  Lab 05/14/18 1326 05/19/18 0226  CKTOTAL 926* 276*   BNP (last 3 results) No results for input(s): PROBNP in the last 8760 hours. HbA1C: No results for input(s): HGBA1C in the last 72 hours. CBG: Recent Labs  Lab 05/19/18  1617 05/19/18 1954 05/19/18 2343 05/20/18 0353 05/20/18 0754  GLUCAP 97 106* 78 139* 65*   Lipid Profile: No results for input(s): CHOL, HDL, LDLCALC, TRIG, CHOLHDL, LDLDIRECT in the last 72 hours. Thyroid Function Tests: No results for input(s): TSH, T4TOTAL, FREET4, T3FREE, THYROIDAB in the last 72 hours. Anemia Panel: No results for input(s): VITAMINB12, FOLATE, FERRITIN, TIBC, IRON, RETICCTPCT in the last 72 hours. Urine analysis:    Component Value Date/Time   COLORURINE STRAW (A) 01/18/2018 1231   APPEARANCEUR CLEAR (A) 01/18/2018 1231   LABSPEC 1.003 (L) 01/18/2018 1231   PHURINE 8.0 01/18/2018 1231   GLUCOSEU NEGATIVE 01/18/2018 1231   HGBUR NEGATIVE 01/18/2018 1231   BILIRUBINUR NEGATIVE 01/18/2018 1231   KETONESUR NEGATIVE 01/18/2018 1231   PROTEINUR NEGATIVE 01/18/2018 1231   NITRITE NEGATIVE 01/18/2018 1231   LEUKOCYTESUR NEGATIVE 01/18/2018 1231   Sepsis Labs: @LABRCNTIP (procalcitonin:4,lacticidven:4)  ) Recent Results (from the past 240 hour(s))  SARS Coronavirus 2 [facility] order, Performed in [place] hospital lab)     Status: None   Collection Time: 05/12/18  9:30 PM  Result Value Ref Range Status   SARS Coronavirus 2 NEGATIVE NEGATIVE Final    Comment: (NOTE) If result is NEGATIVE SARS-CoV-2 target nucleic acids are NOT DETECTED. The SARS-CoV-2 RNA is generally detectable in upper and lower  respiratory specimens during the acute phase of infection. The lowest  concentration of SARS-CoV-2 viral copies this assay can detect is 250  copies / mL. A negative result does not preclude SARS-CoV-2 infection  and should not be used as the sole basis for treatment or other  patient management decisions.  A negative result may occur with  improper specimen collection / handling, submission of specimen other  than nasopharyngeal swab, presence of viral mutation(s) within the  areas targeted by this assay, and inadequate number of viral copies  (<250 copies /  mL). A negative result must be combined with clinical  observations, patient history, and epidemiological information. If result is POSITIVE SARS-CoV-2 target nucleic acids are DETECTED. The SARS-CoV-2 RNA is generally detectable in upper and lower  respiratory specimens dur ing the acute phase of infection.  Positive  results are indicative of active infection with SARS-CoV-2.  Clinical  correlation with patient history and other diagnostic information is  necessary to determine patient infection status.  Positive results do  not rule out bacterial infection or co-infection with other viruses. If result is PRESUMPTIVE POSTIVE SARS-CoV-2 nucleic acids MAY BE PRESENT.   A presumptive positive result was obtained on the submitted specimen  and confirmed on repeat testing.  While 2019 novel coronavirus  (SARS-CoV-2) nucleic acids may be present in the submitted sample  additional confirmatory testing may be necessary for epidemiological  and / or clinical management purposes  to  differentiate between  SARS-CoV-2 and other Sarbecovirus currently known to infect humans.  If clinically indicated additional testing with an alternate test  methodology [phone]) is advised. The SARS-CoV-2 RNA is generally  detectable in upper and lower respiratory sp ecimens during the acute  phase of infection. The expected result is Negative. Fact Sheet for Patients:  StrictlyIdeas.no Fact Sheet for Healthcare Providers: BankingDealers.co.za This test is not yet approved or cleared by the [place] FDA and has been authorized for detection and/or diagnosis of SARS-CoV-2 by FDA under an Emergency Use Authorization (EUA).  This EUA will remain in effect (meaning this test can be used) for the duration of the COVID-19 declaration under Section 564(b)(1) of the Act, 21 U.S.C. section 360bbb-3(b)(1), unless the authorization is terminated or revoked sooner.  Performed at [facility], [place] [address], [place], [place] [address]   Blood Culture (routine x 2)     Status: None   Collection Time: 05/12/18  9:40 PM  Result Value Ref Range Status   Specimen Description BLOOD RIGHT HAND  Final   Special Requests   Final    BOTTLES DRAWN AEROBIC AND ANAEROBIC Blood Culture adequate volume   Culture   Final    NO GROWTH 5 DAYS Performed at [facility], [place] [address] [place], [place] [address]    Report Status 05/17/2018 FINAL  Final  Blood Culture (routine x 2)     Status: None   Collection Time: 05/12/18 10:00 PM  Result Value Ref Range Status   Specimen Description BLOOD RIGHT HAND  Final   Special Requests   Final    BOTTLES DRAWN AEROBIC AND ANAEROBIC Blood Culture results may not be optimal due to an inadequate volume of blood received in culture bottles   Culture   Final    NO GROWTH 5 DAYS Performed at [facility], [place] [address] [place], [place] [address]    Report Status 05/17/2018 FINAL  Final  MRSA PCR Screening     Status: None   Collection Time: 05/13/18  1:29 AM  Result Value Ref Range Status   MRSA by PCR NEGATIVE NEGATIVE Final    Comment:        The GeneXpert MRSA Assay (FDA approved for NASAL specimens only), is one component of a comprehensive MRSA colonization surveillance program. It is not intended to diagnose MRSA infection nor to guide or monitor treatment for MRSA infections. Performed at [facility], [place] [address], [place], [place] [address]   Respiratory Panel by PCR     Status: None   Collection Time: 05/13/18  2:33 AM  Result Value Ref Range Status   Adenovirus NOT DETECTED NOT DETECTED Final   Coronavirus 229E NOT DETECTED NOT DETECTED Final    Comment: (NOTE) The Coronavirus on the Respiratory Panel, DOES NOT test for the novel  Coronavirus (2019 nCoV)    Coronavirus HKU1 NOT DETECTED NOT DETECTED Final   Coronavirus NL63 NOT DETECTED NOT DETECTED Final   Coronavirus  OC43 NOT DETECTED NOT DETECTED Final   Metapneumovirus NOT DETECTED NOT DETECTED Final   Rhinovirus / Enterovirus NOT DETECTED NOT DETECTED Final   Influenza A NOT DETECTED NOT DETECTED Final   Influenza B NOT DETECTED NOT DETECTED Final   Parainfluenza Virus 1 NOT DETECTED NOT DETECTED Final   Parainfluenza Virus 2 NOT DETECTED NOT DETECTED Final   Parainfluenza Virus 3 NOT DETECTED NOT DETECTED Final   Parainfluenza Virus 4 NOT DETECTED NOT  DETECTED Final   Respiratory Syncytial Virus NOT DETECTED NOT DETECTED Final   Bordetella pertussis NOT DETECTED NOT DETECTED Final   Chlamydophila pneumoniae NOT DETECTED NOT DETECTED Final   Mycoplasma pneumoniae NOT DETECTED NOT DETECTED Final    Comment: Performed at [facility], [place] [address], [place], [place] [address]  Culture, respiratory (non-expectorated)     Status: None   Collection Time: 05/13/18  2:33 AM  Result Value Ref Range Status   Specimen Description TRACHEAL ASPIRATE  Final   Special Requests Normal  Final   Gram Stain   Final    FEW WBC PRESENT, PREDOMINANTLY PMN NO ORGANISMS SEEN    Culture   Final    FEW HAEMOPHILUS INFLUENZAE BETA LACTAMASE NEGATIVE Performed at [facility], [place] [address] [place], [place] [address]    Report Status 05/15/2018 FINAL  Final         Radiology Studies: No results found.      Scheduled Meds: . ALPRAZolam  1 mg Oral TID  . budesonide (PULMICORT) nebulizer solution  0.25 mg Nebulization BID  . chlorhexidine gluconate (MEDLINE KIT)  15 mL Mouth Rinse BID  . Chlorhexidine Gluconate Cloth  6 each Topical Daily  . Chlorhexidine Gluconate Cloth  6 each Topical Q0600  . feeding supplement (ENSURE ENLIVE)  237 mL Oral BID BM  . heparin  5,000 Units Subcutaneous Q8H  . insulin aspart  0-15 Units Subcutaneous Q4H  . ipratropium-albuterol  3 mL Nebulization Q6H  . mouth rinse  15 mL Mouth Rinse BID  . methylPREDNISolone (SOLU-MEDROL) injection  40 mg Intravenous  Daily  . pantoprazole  40 mg Oral Daily  . sodium polystyrene  30 g Rectal Once   Continuous Infusions: . sodium chloride    . sodium chloride       LOS: 8 days   The patient is critically ill with multiple organ systems failure and requires high complexity decision making for assessment and support, frequent evaluation and titration of therapies, application of advanced monitoring technologies and extensive interpretation of multiple databases. Critical Care Time devoted to patient care services described in this note  Time spent: 40 minutes     , Geraldo Docker, MD [facility] Pager [phone]  If 7PM-7AM, please contact night-coverage [email or web address] Password [email or web address] 05/20/2018, 8:15 AM

## 2018-05-20 NOTE — Progress Notes (Signed)
Admit: 05/12/2018 LOS: 8  32F s/p cardiac arrest of unclear duration/ VDRF, HIE, and anuric AKI from normal baseline SCr.  Subjective:   . No interval events, some confusion . BiPAP overnight, now on 6L . Minimal UOP yesterday  05/01 0701 - 05/02 0700 In: 445.9 [P.O.:360; I.V.:70.9] Out: 1150 [Urine:50]  Filed Weights   05/19/18 0446 05/19/18 0730 05/20/18 0413  Weight: 93 kg 91.9 kg 91 kg    Scheduled Meds: . ALPRAZolam  1 mg Oral TID  . budesonide (PULMICORT) nebulizer solution  0.25 mg Nebulization BID  . chlorhexidine gluconate (MEDLINE KIT)  15 mL Mouth Rinse BID  . Chlorhexidine Gluconate Cloth  6 each Topical Daily  . Chlorhexidine Gluconate Cloth  6 each Topical Q0600  . feeding supplement (ENSURE ENLIVE)  237 mL Oral BID BM  . heparin  5,000 Units Subcutaneous Q8H  . insulin aspart  0-15 Units Subcutaneous Q4H  . ipratropium-albuterol  3 mL Nebulization Q6H  . mouth rinse  15 mL Mouth Rinse BID  . methylPREDNISolone (SOLU-MEDROL) injection  40 mg Intravenous Daily  . pantoprazole  40 mg Oral Daily  . sodium polystyrene  30 g Rectal Once   Continuous Infusions: . sodium chloride    . sodium chloride     PRN Meds:.sodium chloride, sodium chloride, albuterol, alteplase, fentaNYL (SUBLIMAZE) injection, heparin, heparin, lidocaine (PF), lidocaine-prilocaine, pentafluoroprop-tetrafluoroeth, Resource ThickenUp Clear, sodium chloride flush  Current Labs: reviewed    Physical Exam:  Blood pressure 134/90, pulse (!) 119, temperature 97.9 F (36.6 C), temperature source Axillary, resp. rate 20, height _0  (1.549 m), weight 91 kg, SpO2 93 %. GEN: more awake and alert, conversant ENT: NCAT, EYES: EOMI CV: regular, nl s1s2 no rub PULM: ctab with coarse ant bs ABD: s/nt/nd; LLQ ostomy SKIN: no sig rashes EXT:no sig LEE NEURO: intermittently followscommands  A 1. Anuric AKI, normal baseline; ischemic ATN; K and HCO3 ok.  neg renal US 4/27; subnephrotic proteinuria;  start HD 4/28 2. Cardiac arrest with resultant HIE, CCM and Neuro following; improving 3. COPD; on steroids; some chronic CO2 retention 4. Leukocytosis, on steroids  5. Inc LFts, likely shock related; per CCM; improving 6. Longstanding colostomy 7. ? PNA, s/p cefepime  P . Not yet recvered GFR, cont HD THS schedule, iHD: 2K, 3.5h, Qb 400 Qd 600, 3L UF, tight heparin . Medication Issues; o Preferred narcotic agents for pain control are hydromorphone, fentanyl, and methadone. Morphine should not be used.  o Baclofen should be avoided o Avoid oral sodium phosphate and magnesium citrate based laxatives / bowel preps    Pearson Grippe MD 05/20/2018, 8:28 AM  Recent Labs  Lab 05/17/18 6803 05/18/18 2122  05/18/18 2326 05/19/18 0226 05/19/18 0454 05/19/18 0523 05/19/18 1215  NA 138 136   < > 137 138 134*  --  137  K 4.5 5.0   < > 6.0* 6.0* 6.0* 6.1* 5.1  CL 95* 97*  --  97* 95*  --   --  95*  CO2 28 26  --  25 24  --   --  25  GLUCOSE 142* 144*  --  125* 125*  --   --  166*  BUN 106* 141*  --  153* 163*  --   --  97*  CREATININE 5.07* 6.05*  --  6.95* 7.02*  --   --  5.01*  CALCIUM 8.3* 8.6*  --  8.5* 8.9  --   --  8.9  PHOS 3.7 4.7*  --   --  6.8*  --   --   --    < > = values in this interval not displayed.   Recent Labs  Lab 05/14/18 [phone]  05/15/18 0229  05/17/18 0538 05/18/18 0314  05/18/18 2242 05/19/18 0226 05/19/18 0454  WBC 12.8*  --  22.2*  23.8*   < > 17.0* 19.4*  --   --  16.0*  --   NEUTROABS 11.3*  --  18.9*  --   --   --   --   --   --   --   HGB 11.5*   < > 11.1*  11.1*   < > 11.1* 11.1*   < > 10.2* 10.6* 9.9*  HCT 37.8   < > 35.8*  34.4*   < > 35.0* 34.5*   < > 30.0* 33.0* 29.0*  MCV 97.4  --  95.5  95.0   < > 93.8 92.7  --   --  92.7  --   PLT 214  --  217  203   < > 164 145*  --   --  131*  --    < > = values in this interval not displayed.

## 2018-05-20 NOTE — Progress Notes (Signed)
CBG at 2052 = 88

## 2018-05-21 ENCOUNTER — Inpatient Hospital Stay (HOSPITAL_COMMUNITY): Payer: 59

## 2018-05-21 LAB — COMPREHENSIVE METABOLIC PANEL
ALT: 435 U/L — ABNORMAL HIGH (ref 0–44)
AST: 55 U/L — ABNORMAL HIGH (ref 15–41)
Albumin: 2.7 g/dL — ABNORMAL LOW (ref 3.5–5.0)
Alkaline Phosphatase: 68 U/L (ref 38–126)
Anion gap: 15 (ref 5–15)
BUN: 45 mg/dL — ABNORMAL HIGH (ref 6–20)
CO2: 28 mmol/L (ref 22–32)
Calcium: 8.8 mg/dL — ABNORMAL LOW (ref 8.9–10.3)
Chloride: 96 mmol/L — ABNORMAL LOW (ref 98–111)
Creatinine, Ser: 3.79 mg/dL — ABNORMAL HIGH (ref 0.44–1.00)
GFR calc Af Amer: 15 mL/min — ABNORMAL LOW (ref >60)
GFR calc non Af Amer: 13 mL/min — ABNORMAL LOW (ref >60)
Glucose, Bld: 75 mg/dL (ref 70–99)
Potassium: 4.1 mmol/L (ref 3.5–5.1)
Sodium: 139 mmol/L (ref 135–145)
Total Bilirubin: 0.7 mg/dL (ref 0.3–1.2)
Total Protein: 5.8 g/dL — ABNORMAL LOW (ref 6.5–8.1)

## 2018-05-21 LAB — URINALYSIS, ROUTINE W REFLEX MICROSCOPIC
Bilirubin Urine: NEGATIVE
Glucose, UA: NEGATIVE mg/dL
Ketones, ur: NEGATIVE mg/dL
Leukocytes,Ua: NEGATIVE
Nitrite: NEGATIVE
Protein, ur: 100 mg/dL — AB
Specific Gravity, Urine: 1.009 (ref 1.005–1.030)
pH: 6 (ref 5.0–8.0)

## 2018-05-21 LAB — BLOOD GAS, ARTERIAL
Acid-Base Excess: 2.6 mmol/L — ABNORMAL HIGH (ref 0.0–2.0)
Bicarbonate: 27.9 mmol/L (ref 20.0–28.0)
Delivery systems: POSITIVE
Drawn by: 331001
Expiratory PAP: 5
FIO2: 40
Inspiratory PAP: 15
O2 Saturation: 96.4 %
Patient temperature: 98.5
pCO2 arterial: 53 mmHg — ABNORMAL HIGH (ref 32.0–48.0)
pH, Arterial: 7.34 — ABNORMAL LOW (ref 7.350–7.450)
pO2, Arterial: 100 mmHg (ref 83.0–108.0)

## 2018-05-21 LAB — CBC
HCT: 31.7 % — ABNORMAL LOW (ref 36.0–46.0)
Hemoglobin: 10 g/dL — ABNORMAL LOW (ref 12.0–15.0)
MCH: 29.9 pg (ref 26.0–34.0)
MCHC: 31.5 g/dL (ref 30.0–36.0)
MCV: 94.6 fL (ref 80.0–100.0)
Platelets: 158 10*3/uL (ref 150–400)
RBC: 3.35 MIL/uL — ABNORMAL LOW (ref 3.87–5.11)
RDW: 14 % (ref 11.5–15.5)
WBC: 12.2 10*3/uL — ABNORMAL HIGH (ref 4.0–10.5)
nRBC: 0 % (ref 0.0–0.2)

## 2018-05-21 LAB — GLUCOSE, CAPILLARY
Glucose-Capillary: 74 mg/dL (ref 70–99)
Glucose-Capillary: 86 mg/dL (ref 70–99)
Glucose-Capillary: 135 mg/dL — ABNORMAL HIGH (ref 70–99)
Glucose-Capillary: 132 mg/dL — ABNORMAL HIGH (ref 70–99)

## 2018-05-21 LAB — MAGNESIUM: Magnesium: 2.1 mg/dL (ref 1.7–2.4)

## 2018-05-21 MED ORDER — PANTOPRAZOLE SODIUM 40 MG IV SOLR
40.0000 mg | Freq: Every morning | INTRAVENOUS | Status: DC
  Administered 2018-05-21 – 2018-05-24 (×4): 40 mg via INTRAVENOUS
  Filled 2018-05-21 (×4): qty 40

## 2018-05-21 MED ORDER — HALOPERIDOL LACTATE 5 MG/ML IJ SOLN
1.0000 mg | Freq: Three times a day (TID) | INTRAMUSCULAR | Status: DC | PRN
  Administered 2018-05-21: 12:00:00 1 mg via INTRAVENOUS
  Filled 2018-05-21: qty 1

## 2018-05-21 MED ORDER — ALPRAZOLAM 0.5 MG PO TABS
1.0000 mg | ORAL_TABLET | Freq: Three times a day (TID) | ORAL | Status: DC
  Administered 2018-05-21 – 2018-05-25 (×12): 1 mg via ORAL
  Filled 2018-05-21 (×11): qty 2

## 2018-05-21 MED ORDER — LORAZEPAM 2 MG/ML IJ SOLN
2.0000 mg | Freq: Once | INTRAMUSCULAR | Status: AC
  Administered 2018-05-21: 14:00:00 4 mg via INTRAVENOUS
  Filled 2018-05-21: qty 2

## 2018-05-21 MED ORDER — ESCITALOPRAM OXALATE 10 MG PO TABS
10.0000 mg | ORAL_TABLET | Freq: Every day | ORAL | Status: DC
  Administered 2018-05-21 – 2018-05-27 (×7): 10 mg via ORAL
  Filled 2018-05-21 (×7): qty 1

## 2018-05-21 MED ORDER — CHLORHEXIDINE GLUCONATE 0.12 % MT SOLN
OROMUCOSAL | Status: AC
  Administered 2018-05-21: 21:00:00 15 mL via OROMUCOSAL
  Filled 2018-05-21: qty 15

## 2018-05-21 NOTE — Progress Notes (Signed)
Assisted tele visit to patient with husband.  Joany Khatib Ann, RN  

## 2018-05-21 NOTE — Progress Notes (Addendum)
PROGRESS NOTE    Misty Green  YHC:[id number] DOB: 11/05/1970 DOA: 05/12/2018 PCP: Sinda Du, MD   Brief Narrative:  48 yr old WF PMHx anxiety, depression, COPD, OSA/OHS, tobacco abuse, CHF, chronic back pain from spinal stenoses, diverticulosis s/p colostomy presents s/p Cardiac arrest.  Opioid dependence/abuse PCCM consulted for admission.   Her baseline is awake alert oriented x 4 fully functional with iADLS per her daughter she is not very compliant with her medications, she last saw her primary doctor almost a yr ago per her husband.  She uses a CPAP at night which she received 3L O2 through but does not require supplemental oxygen during awake and active periods.   Per her husband the patient started feeling ill ( symptoms included: cough, nasal congestion, occasional SOB) on 05/07/2018 her symptoms became worse on Wednesday 4/22  She complained of a headache accompanied by nausea and vomiting unable to tolerate oral intake. On Thursday 4/23 she continued to have the headache despite tylenol but was drinking fluids, and had a 1/2 can soup and some crackers.  The morning of 05/12/18 she complained of hot and cold flashes. She took tylenol throughout the week for these symptoms. [name] does not know how much she took.   When he found her unresponsive at 8 pm he called 911 immediately he states that he cleared the bed and laid her flat shortly after she began to vomit. He was instructed to turn her on her side which he did. He states that he believes it was  5 mins between the time 911 was called and when Fire arrived.   Downtime unknown: in the EMR it is listed as 13-14 min code but upon further investigation  per husband states that the pt had been "sleeping" all day and he does not have a definitive time. He found her unresponsive at 8 pm but He last spoke to her at noon and she verbally responded at that time and went back to sleep.   Daughter states that she doesn't live with he mother  but spoke to her via telephone throughout the week up until 4/23 in the evening and her mom denied any complaints to her. She states that her mother had a cardiac arrest back when she was pregnant with her brother and was diagnosed with peripartum cardiomyopathy. She is supposed to be on lasix which she doesn't take.   She presents on 4/25 to [facility] s/p cardiac arrest (initial rhythm unknown). King airway was placed by EMS and she was started on Epi gtt. Intubated in the ED received RSI at 2120. COVID negative off of vasopressors. PCCM consulted.   Subjective: 5/3 Sleepy but will wake up.  A/O x0.  States she is at home and then request that you leave her alone allow her to go back to sleep.   Assessment & Plan:   Active Problems:   COPD exacerbation (HCC)   OSA on CPAP   Cardiac arrest (HCC)   Lactic acidosis   Transaminitis   Acute on chronic respiratory failure with hypercapnia (HCC)  Cardiac arrest/presumed PEA - Presumed secondary to respiratory arrest - Appears patient was down for significant amount of time before found by her husband see above H&P - CPR ~15 minutes before ROSC - Echocardiogram show retained EF see results below   Acute on chronic respiratory failure with hypoxia and hypercapnia - Titrate O2/BiPAP to maintain SPO2 89 - 93%. - BiPAP Naps /QHS  Aspiration pneumonia/H flu pneumonia - Completed course  of antibiotics  COPD exacerbation - Albuterol neb as needed -Pulmicort nebulizer twice daily -DuoNeb QID -Solu-Medrol 40 mg daily   OSA/OHS? -Uses CPAP at night - 5/3 ABG consistent with compensated OSA/OHS   Septic shock, -Resolved    Acute encephalopathy s/p cardiac arrest -Multifactorial uremia, hypercarbia, anoxic brain injury?  Medication - Correct underlying problems -Infection?  Blood/urine cultures pending -Hospital delirium? -Anoxic brain injury?  Was not seen on first MRI but repeat MRI pending -5/3 uremia corrected by HD yesterday.   Patient more awake however combative, incoherent answers to questions.  Personally reviewed MRI brain from 4/26 although not mentioned in report appears may be a lesion in the right temporal area.  Repeat brain MRI. -Per staff and family had psych issues prior to admission, medication stopped acutely secondary to PEA/intubation medication withdrawal?  -5/3 increase Xanax to 1 mg TID(hm dose)  -5/3 restart Lexapro 10 mg daily(hm dose)  -5/3 start Haldol 1 mg q 8 hrs PRN  Acute renal failure now on intermittent HD T/Th/Sat -Started on 4/28 - HD per nephrology - 5/2 received HD secondary to uremia -Avoid nephrotoxic medication -Avoid/limit sedating medication secondary to poor renal clearance  Hyperkalemia - Monitor closely - Patient due for HD today  Elevated Transaminases -Most likely secondary to cardiac arrest --> shock liver -Monitor intermittently to ensure trending down -Supportive care     DVT prophylaxis: Heparin subcu Code Status: Full Family Communication: Called and attempted to contact family at the preferred number in the chart on 5/3 at [place] hrs. no one answered left message. Disposition Plan: TBD   Consultants:  PCCM Nephrology    Procedures/Significant Events:  4/30 echocardiogram;"-LVEF = 60-65%. Left ventricular diastolic function could not be evaluated.    I have personally reviewed and interpreted all radiology studies and my findings are as above.  VENTILATOR SETTINGS:    Cultures 4/24 Blood: ngtd 4/24 covid: NEG 4/24 RVP: neg 4/24 resp: Positive H. influenzae 5/3 urine pending 5/3 blood pending   Antimicrobials: Anti-infectives (From admission, onward)   Start     Stop   05/15/18 0800  ceFEPIme (MAXIPIME) 1 g in sodium chloride 0.9 % 100 mL IVPB  Status:  Discontinued     05/17/18 0954   05/13/18 1400  ceFEPIme (MAXIPIME) 2 g in sodium chloride 0.9 % 100 mL IVPB  Status:  Discontinued     05/14/18 1242   05/13/18 0315  metroNIDAZOLE  (FLAGYL) IVPB 500 mg  Status:  Discontinued     05/14/18 1100   05/13/18 0230  azithromycin (ZITHROMAX) 500 mg in sodium chloride 0.9 % 250 mL IVPB     05/13/18 0456   05/12/18 2230  ceFEPIme (MAXIPIME) 2 g in sodium chloride 0.9 % 100 mL IVPB     05/12/18 2338   05/12/18 2230  vancomycin (VANCOCIN) 1,500 mg in sodium chloride 0.9 % 500 mL IVPB  Status:  Discontinued     05/12/18 2342       Devices   LINES / TUBES:      Continuous Infusions: . sodium chloride    . sodium chloride       Objective: Vitals:   05/21/18 0500 05/21/18 0600 05/21/18 0700 05/21/18 0800  BP: 121/64 124/65 138/75   Pulse: 82 79 84   Resp: 19 (!) 21 20   Temp:    99.5 F (37.5 C)  TempSrc:    Oral  SpO2: 98% 98% 98%   Weight: 88.8 kg     Height:  Intake/Output Summary (Last 24 hours) at 05/21/2018 0907 Last data filed at 05/20/2018 2021 Gross per 24 hour  Intake 0 ml  Output 3050 ml  Net -3050 ml   Filed Weights   05/20/18 0413 05/20/18 1640 05/21/18 0500  Weight: 91 kg 95.3 kg 88.8 kg   General: Sleepy but arousable.  When aroused states she is at her home and asking to leave her alone.  No acute respiratory distress Eyes: negative scleral hemorrhage, negative anisocoria, negative icterus ENT: Negative Runny nose, negative gingival bleeding, Neck:  Negative scars, masses, torticollis, lymphadenopathy, JVD Lungs: Clear to auscultation bilaterally without wheezes or crackles Cardiovascular: Regular rate and rhythm without murmur gallop or rub normal S1 and S2 Abdomen: negative abdominal pain, nondistended, positive soft, bowel sounds, no rebound, no ascites, no appreciable mass Extremities: No significant cyanosis, clubbing, or edema bilateral lower extremities Skin: Negative rashes, lesions, ulcers Psychiatric:  Negative depression, negative anxiety, negative fatigue, negative mania  Central nervous system:  Cranial nerves II through XII intact, tongue/uvula midline, all  extremities muscle strength 5/5, sensation intact throughout, negative dysarthria, negative expressive aphasia,           Data Reviewed: Care during the described time interval was provided by me .  I have reviewed this patient's available data, including medical history, events of note, physical examination, and all test results as part of my evaluation.   CBC: Recent Labs  Lab 05/15/18 0229  05/17/18 0538 05/18/18 0314  05/18/18 2242 05/19/18 0226 05/19/18 0454 05/20/18 0923 05/21/18 0430  WBC 22.2*  23.8*   < > 17.0* 19.4*  --   --  16.0*  --  15.7* 12.2*  NEUTROABS 18.9*  --   --   --   --   --   --   --   --   --   HGB 11.1*  11.1*   < > 11.1* 11.1*   < > 10.2* 10.6* 9.9* 10.8* 10.0*  HCT 35.8*  34.4*   < > 35.0* 34.5*   < > 30.0* 33.0* 29.0* 34.3* 31.7*  MCV 95.5  95.0   < > 93.8 92.7  --   --  92.7  --  94.5 94.6  PLT 217  203   < > 164 145*  --   --  131*  --  162 158   < > = values in this interval not displayed.   Basic Metabolic Panel: Recent Labs  Lab 05/16/18 0504 05/17/18 5859 05/18/18 2924  05/18/18 2326 05/19/18 0226 05/19/18 0454 05/19/18 0523 05/19/18 1215 05/20/18 0923 05/21/18 0430  NA 139 138 136   < > 137 138 134*  --  137 137 139  K 3.7 4.5 5.0   < > 6.0* 6.0* 6.0* 6.1* 5.1 5.3* 4.1  CL 92* 95* 97*  --  97* 95*  --   --  95* 96* 96*  CO2 28 28 26   --  25 24  --   --  25 25 28   GLUCOSE 166* 142* 144*  --  125* 125*  --   --  166* 118* 75  BUN 133* 106* 141*  --  153* 163*  --   --  97* 120* 45*  CREATININE 6.03* 5.07* 6.05*  --  6.95* 7.02*  --   --  5.01* 6.25* 3.79*  CALCIUM 8.9 8.3* 8.6*  --  8.5* 8.9  --   --  8.9 8.8* 8.8*  MG 2.8* 2.3 2.5*  --   --  2.6*  --   --   --  2.4 2.1  PHOS 2.5 3.7 4.7*  --   --  6.8*  --   --   --   --   --    < > = values in this interval not displayed.   GFR: Estimated Creatinine Clearance: 18.4 mL/min (A) (by C-G formula based on SCr of 3.79 mg/dL (H)). Liver Function Tests: Recent Labs  Lab  05/16/18 0504 05/17/18 0538 05/18/18 0314 05/19/18 0226 05/21/18 0430  AST 483* 178* 89* 64* 55*  ALT 2,195* 1,461* 1,017* 748* 435*  ALKPHOS 122 100 88 81 68  BILITOT 0.7 0.4 0.5 0.8 0.7  PROT 6.1* 5.9* 5.7* 5.6* 5.8*  ALBUMIN 2.6* 2.4* 2.3* 2.5*  2.4* 2.7*   No results for input(s): LIPASE, AMYLASE in the last 168 hours. Recent Labs  Lab 05/14/18 1326 05/17/18 0700 05/20/18 1231  AMMONIA 55* 37* 35   Coagulation Profile: No results for input(s): INR, PROTIME in the last 168 hours. Cardiac Enzymes: Recent Labs  Lab 05/14/18 1326 05/19/18 0226  CKTOTAL 926* 276*   BNP (last 3 results) No results for input(s): PROBNP in the last 8760 hours. HbA1C: No results for input(s): HGBA1C in the last 72 hours. CBG: Recent Labs  Lab 05/20/18 0833 05/20/18 1246 05/20/18 1621 05/20/18 2334 05/21/18 0755  GLUCAP 91 140* 85 74 74   Lipid Profile: No results for input(s): CHOL, HDL, LDLCALC, TRIG, CHOLHDL, LDLDIRECT in the last 72 hours. Thyroid Function Tests: No results for input(s): TSH, T4TOTAL, FREET4, T3FREE, THYROIDAB in the last 72 hours. Anemia Panel: No results for input(s): VITAMINB12, FOLATE, FERRITIN, TIBC, IRON, RETICCTPCT in the last 72 hours. Urine analysis:    Component Value Date/Time   COLORURINE STRAW (A) 01/18/2018 1231   APPEARANCEUR CLEAR (A) 01/18/2018 1231   LABSPEC 1.003 (L) 01/18/2018 1231   PHURINE 8.0 01/18/2018 1231   GLUCOSEU NEGATIVE 01/18/2018 1231   HGBUR NEGATIVE 01/18/2018 1231   BILIRUBINUR NEGATIVE 01/18/2018 1231   KETONESUR NEGATIVE 01/18/2018 1231   PROTEINUR NEGATIVE 01/18/2018 1231   NITRITE NEGATIVE 01/18/2018 1231   LEUKOCYTESUR NEGATIVE 01/18/2018 1231   Sepsis Labs: @LABRCNTIP (procalcitonin:4,lacticidven:4)  ) Recent Results (from the past 240 hour(s))  SARS Coronavirus 2 [facility] order, Performed in [place] hospital lab)     Status: None   Collection Time: 05/12/18  9:30 PM  Result Value Ref Range Status    SARS Coronavirus 2 NEGATIVE NEGATIVE Final    Comment: (NOTE) If result is NEGATIVE SARS-CoV-2 target nucleic acids are NOT DETECTED. The SARS-CoV-2 RNA is generally detectable in upper and lower  respiratory specimens during the acute phase of infection. The lowest  concentration of SARS-CoV-2 viral copies this assay can detect is 250  copies / mL. A negative result does not preclude SARS-CoV-2 infection  and should not be used as the sole basis for treatment or other  patient management decisions.  A negative result may occur with  improper specimen collection / handling, submission of specimen other  than nasopharyngeal swab, presence of viral mutation(s) within the  areas targeted by this assay, and inadequate number of viral copies  (<250 copies / mL). A negative result must be combined with clinical  observations, patient history, and epidemiological information. If result is POSITIVE SARS-CoV-2 target nucleic acids are DETECTED. The SARS-CoV-2 RNA is generally detectable in upper and lower  respiratory specimens dur ing the acute phase of infection.  Positive  results are indicative of active infection with  SARS-CoV-2.  Clinical  correlation with patient history and other diagnostic information is  necessary to determine patient infection status.  Positive results do  not rule out bacterial infection or co-infection with other viruses. If result is PRESUMPTIVE POSTIVE SARS-CoV-2 nucleic acids MAY BE PRESENT.   A presumptive positive result was obtained on the submitted specimen  and confirmed on repeat testing.  While 2019 novel coronavirus  (SARS-CoV-2) nucleic acids may be present in the submitted sample  additional confirmatory testing may be necessary for epidemiological  and / or clinical management purposes  to differentiate between  SARS-CoV-2 and other Sarbecovirus currently known to infect humans.  If clinically indicated additional testing with an alternate test   methodology [phone]) is advised. The SARS-CoV-2 RNA is generally  detectable in upper and lower respiratory sp ecimens during the acute  phase of infection. The expected result is Negative. Fact Sheet for Patients:  StrictlyIdeas.no Fact Sheet for Healthcare Providers: BankingDealers.co.za This test is not yet approved or cleared by the [place] FDA and has been authorized for detection and/or diagnosis of SARS-CoV-2 by FDA under an Emergency Use Authorization (EUA).  This EUA will remain in effect (meaning this test can be used) for the duration of the COVID-19 declaration under Section 564(b)(1) of the Act, 21 U.S.C. section 360bbb-3(b)(1), unless the authorization is terminated or revoked sooner. Performed at [facility], [place] [address] [place], [place] [address]   Blood Culture (routine x 2)     Status: None   Collection Time: 05/12/18  9:40 PM  Result Value Ref Range Status   Specimen Description BLOOD RIGHT HAND  Final   Special Requests   Final    BOTTLES DRAWN AEROBIC AND ANAEROBIC Blood Culture adequate volume   Culture   Final    NO GROWTH 5 DAYS Performed at [facility], [place] [address], [place], [place] [address]    Report Status 05/17/2018 FINAL  Final  Blood Culture (routine x 2)     Status: None   Collection Time: 05/12/18 10:00 PM  Result Value Ref Range Status   Specimen Description BLOOD RIGHT HAND  Final   Special Requests   Final    BOTTLES DRAWN AEROBIC AND ANAEROBIC Blood Culture results may not be optimal due to an inadequate volume of blood received in culture bottles   Culture   Final    NO GROWTH 5 DAYS Performed at [facility], [place] [address], [place], [place] [address]    Report Status 05/17/2018 FINAL  Final  MRSA PCR Screening     Status: None   Collection Time: 05/13/18  1:29 AM  Result Value Ref Range Status   MRSA by PCR NEGATIVE NEGATIVE Final    Comment:        The  GeneXpert MRSA Assay (FDA approved for NASAL specimens only), is one component of a comprehensive MRSA colonization surveillance program. It is not intended to diagnose MRSA infection nor to guide or monitor treatment for MRSA infections. Performed at [facility], [place] [address], [place],  [address]   Respiratory Panel by PCR     Status: None   Collection Time: 05/13/18  2:33 AM  Result Value Ref Range Status   Adenovirus NOT DETECTED NOT DETECTED Final   Coronavirus 229E NOT DETECTED NOT DETECTED Final    Comment: (NOTE) The Coronavirus on the Respiratory Panel, DOES NOT test for the novel  Coronavirus (2019 nCoV)    Coronavirus  HKU1 NOT DETECTED NOT DETECTED Final   Coronavirus NL63 NOT DETECTED NOT DETECTED Final   Coronavirus OC43 NOT DETECTED NOT DETECTED Final   Metapneumovirus NOT DETECTED NOT DETECTED Final   Rhinovirus / Enterovirus NOT DETECTED NOT DETECTED Final   Influenza A NOT DETECTED NOT DETECTED Final   Influenza B NOT DETECTED NOT DETECTED Final   Parainfluenza Virus 1 NOT DETECTED NOT DETECTED Final   Parainfluenza Virus 2 NOT DETECTED NOT DETECTED Final   Parainfluenza Virus 3 NOT DETECTED NOT DETECTED Final   Parainfluenza Virus 4 NOT DETECTED NOT DETECTED Final   Respiratory Syncytial Virus NOT DETECTED NOT DETECTED Final   Bordetella pertussis NOT DETECTED NOT DETECTED Final   Chlamydophila pneumoniae NOT DETECTED NOT DETECTED Final   Mycoplasma pneumoniae NOT DETECTED NOT DETECTED Final    Comment: Performed at [facility], [place] [address] [place], [place] [address]  Culture, respiratory (non-expectorated)     Status: None   Collection Time: 05/13/18  2:33 AM  Result Value Ref Range Status   Specimen Description TRACHEAL ASPIRATE  Final   Special Requests Normal  Final   Gram Stain   Final    FEW WBC PRESENT, PREDOMINANTLY PMN NO ORGANISMS SEEN    Culture   Final    FEW HAEMOPHILUS INFLUENZAE BETA LACTAMASE NEGATIVE  Performed at [facility], [place] [address] [place], [place] [address]    Report Status 05/15/2018 FINAL  Final         Radiology Studies: No results found.      Scheduled Meds: . ALPRAZolam  0.5 mg Oral TID  . budesonide (PULMICORT) nebulizer solution  0.25 mg Nebulization BID  . chlorhexidine gluconate (MEDLINE KIT)  15 mL Mouth Rinse BID  . Chlorhexidine Gluconate Cloth  6 each Topical Daily  . Chlorhexidine Gluconate Cloth  6 each Topical Q0600  . feeding supplement (ENSURE ENLIVE)  237 mL Oral BID BM  . heparin  5,000 Units Subcutaneous Q8H  . insulin aspart  0-15 Units Subcutaneous Q4H  . ipratropium-albuterol  3 mL Nebulization Q6H  . mouth rinse  15 mL Mouth Rinse BID  . methylPREDNISolone (SOLU-MEDROL) injection  40 mg Intravenous Daily  . pantoprazole  40 mg Oral Daily  . sodium polystyrene  30 g Rectal Once   Continuous Infusions: . sodium chloride    . sodium chloride       LOS: 9 days   The patient is critically ill with multiple organ systems failure and requires high complexity decision making for assessment and support, frequent evaluation and titration of therapies, application of advanced monitoring technologies and extensive interpretation of multiple databases. Critical Care Time devoted to patient care services described in this note  Time spent: 40 minutes     , Geraldo Docker, MD [facility] Pager [phone]  If 7PM-7AM, please contact night-coverage [email or web address] Password [email or web address] 05/21/2018, 9:07 AM

## 2018-05-21 NOTE — Progress Notes (Signed)
Admit: 05/12/2018 LOS: 9  Misty Green s/p cardiac arrest of unclear duration/ VDRF, HIE, and anuric AKI from normal baseline SCr.  Subjective:   . Progressive agitated delirium . HD Yesterday: 3L UF, agitated during Tx as well . No reported UOP  05/02 0701 - 05/03 0700 In: 480 [P.O.:480] Out: 3050 [Stool:50]  Filed Weights   05/20/18 0413 05/20/18 1640 05/21/18 0500  Weight: 91 kg 95.3 kg 88.8 kg    Scheduled Meds: . ALPRAZolam  0.5 mg Oral TID  . budesonide (PULMICORT) nebulizer solution  0.25 mg Nebulization BID  . chlorhexidine gluconate (MEDLINE KIT)  15 mL Mouth Rinse BID  . Chlorhexidine Gluconate Cloth  6 each Topical Daily  . Chlorhexidine Gluconate Cloth  6 each Topical Q0600  . feeding supplement (ENSURE ENLIVE)  237 mL Oral BID BM  . heparin      . heparin  5,000 Units Subcutaneous Q8H  . insulin aspart  0-15 Units Subcutaneous Q4H  . ipratropium-albuterol  3 mL Nebulization Q6H  . mouth rinse  15 mL Mouth Rinse BID  . methylPREDNISolone (SOLU-MEDROL) injection  40 mg Intravenous Daily  . pantoprazole  40 mg Oral Daily  . sodium polystyrene  30 g Rectal Once   Continuous Infusions: . sodium chloride    . sodium chloride     PRN Meds:.sodium chloride, sodium chloride, albuterol, alteplase, fentaNYL (SUBLIMAZE) injection, heparin, heparin, lidocaine (PF), lidocaine-prilocaine, pentafluoroprop-tetrafluoroeth, Resource ThickenUp Clear, sodium chloride flush  Current Labs: reviewed    Physical Exam:  Blood pressure 138/75, pulse 84, temperature 99.5 F (37.5 C), temperature source Oral, resp. rate 20, height 5' 1"  (1.549 m), weight 88.8 kg, SpO2 98 %. GEN: awake, agitated ENT: NCAT, EYES: EOMI CV: regular, nl s1s2 no rub PULM: ctab with coarse ant bs ABD: s/nt/nd; LLQ ostomy SKIN: no sig rashes EXT:no sig LEE NEURO: delirious  A 1. Anuric AKI, normal baseline; ischemic ATN; K and HCO3 ok.  neg renal US 4/27; subnephrotic proteinuria; start HD 4/28 2. Cardiac  arrest with resultant HIE, CCM and Neuro following; improving 3. COPD; on steroids; some chronic CO2 retention 4. Leukocytosis, on steroids  5. Inc LFts, likely shock related; per CCM; improving 6. Longstanding colostomy 7. ? PNA, s/p cefepime 8. Delirium, agitated  P . Not yet recovered GFR, HD tomorrow: iHD: 2K, 3.5h, Qb 400 Qd 600, 3L UF, tight heparin . Medication Issues; o Preferred narcotic agents for pain control are hydromorphone, fentanyl, and methadone. Morphine should not be used.  o Baclofen should be avoided o Avoid oral sodium phosphate and magnesium citrate based laxatives / bowel preps    Pearson Grippe MD 05/21/2018, 8:41 AM  Recent Labs  Lab 05/17/18 9407 05/18/18 0314  05/19/18 0226  05/19/18 1215 05/20/18 0923 05/21/18 0430  NA 138 136   < > 138   < > 137 137 139  K 4.5 5.0   < > 6.0*   < > 5.1 5.3* 4.1  CL 95* 97*   < > 95*  --  95* 96* 96*  CO2 28 26   < > 24  --  25 25 28   GLUCOSE 142* 144*   < > 125*  --  166* 118* 75  BUN 106* 141*   < > 163*  --  97* 120* 45*  CREATININE 5.07* 6.05*   < > 7.02*  --  5.01* 6.25* 3.79*  CALCIUM 8.3* 8.6*   < > 8.9  --  8.9 8.8* 8.8*  PHOS 3.7 4.7*  --  6.8*  --   --   --   --    < > = values in this interval not displayed.   Recent Labs  Lab 05/15/18 0229  05/19/18 0226 05/19/18 0454 05/20/18 0923 05/21/18 0430  WBC 22.2*  23.8*   < > 16.0*  --  15.7* 12.2*  NEUTROABS 18.9*  --   --   --   --   --   HGB 11.1*  11.1*   < > 10.6* 9.9* 10.8* 10.0*  HCT 35.8*  34.4*   < > 33.0* 29.0* 34.3* 31.7*  MCV 95.5  95.0   < > 92.7  --  94.5 94.6  PLT 217  203   < > 131*  --  162 158   < > = values in this interval not displayed.

## 2018-05-22 DIAGNOSIS — N179 Acute kidney failure, unspecified: Secondary | ICD-10-CM

## 2018-05-22 DIAGNOSIS — R5381 Other malaise: Secondary | ICD-10-CM

## 2018-05-22 DIAGNOSIS — G9341 Metabolic encephalopathy: Secondary | ICD-10-CM

## 2018-05-22 LAB — BASIC METABOLIC PANEL
Anion gap: 13 (ref 5–15)
BUN: 65 mg/dL — ABNORMAL HIGH (ref 6–20)
CO2: 26 mmol/L (ref 22–32)
Calcium: 8.2 mg/dL — ABNORMAL LOW (ref 8.9–10.3)
Chloride: 99 mmol/L (ref 98–111)
Creatinine, Ser: 5.19 mg/dL — ABNORMAL HIGH (ref 0.44–1.00)
GFR calc Af Amer: 11 mL/min — ABNORMAL LOW (ref >60)
GFR calc non Af Amer: 9 mL/min — ABNORMAL LOW (ref >60)
Glucose, Bld: 73 mg/dL (ref 70–99)
Potassium: 5.1 mmol/L (ref 3.5–5.1)
Sodium: 138 mmol/L (ref 135–145)

## 2018-05-22 LAB — CBC
HCT: 31.2 % — ABNORMAL LOW (ref 36.0–46.0)
Hemoglobin: 10 g/dL — ABNORMAL LOW (ref 12.0–15.0)
MCH: 30.4 pg (ref 26.0–34.0)
MCHC: 32.1 g/dL (ref 30.0–36.0)
MCV: 94.8 fL (ref 80.0–100.0)
Platelets: 180 10*3/uL (ref 150–400)
RBC: 3.29 MIL/uL — ABNORMAL LOW (ref 3.87–5.11)
RDW: 13.5 % (ref 11.5–15.5)
WBC: 12.7 10*3/uL — ABNORMAL HIGH (ref 4.0–10.5)
nRBC: 0 % (ref 0.0–0.2)

## 2018-05-22 LAB — GLUCOSE, CAPILLARY
Glucose-Capillary: 104 mg/dL — ABNORMAL HIGH (ref 70–99)
Glucose-Capillary: 121 mg/dL — ABNORMAL HIGH (ref 70–99)
Glucose-Capillary: 60 mg/dL — ABNORMAL LOW (ref 70–99)
Glucose-Capillary: 73 mg/dL (ref 70–99)
Glucose-Capillary: 83 mg/dL (ref 70–99)
Glucose-Capillary: 84 mg/dL (ref 70–99)
Glucose-Capillary: 85 mg/dL (ref 70–99)
Glucose-Capillary: 88 mg/dL (ref 70–99)
Glucose-Capillary: 91 mg/dL (ref 70–99)

## 2018-05-22 LAB — MAGNESIUM: Magnesium: 2.2 mg/dL (ref 1.7–2.4)

## 2018-05-22 MED ORDER — ALPRAZOLAM 0.5 MG PO TABS
ORAL_TABLET | ORAL | Status: AC
  Filled 2018-05-22: qty 2

## 2018-05-22 MED ORDER — HEPARIN SODIUM (PORCINE) 1000 UNIT/ML DIALYSIS: 20.0000 [IU]/kg | INTRAMUSCULAR | Status: DC | PRN

## 2018-05-22 MED ORDER — DEXTROSE 50 % IV SOLN
INTRAVENOUS | Status: AC
  Administered 2018-05-22: 04:00:00 12.5 g via INTRAVENOUS
  Filled 2018-05-22: qty 50

## 2018-05-22 MED ORDER — DEXTROSE 50 % IV SOLN
12.5000 g | Freq: Once | INTRAVENOUS | Status: AC
  Administered 2018-05-22: 12.5 g via INTRAVENOUS

## 2018-05-22 MED ORDER — HEPARIN SODIUM (PORCINE) 1000 UNIT/ML IJ SOLN
INTRAMUSCULAR | Status: AC
  Filled 2018-05-22: qty 3

## 2018-05-22 NOTE — Progress Notes (Signed)
PROGRESS NOTE  Misty Green  TXH:[id number] DOB: [birth year] DOA: 05/12/2018 PCP: Sinda Du, MD  HPI/Recap of past 24 hours:  Denies pain, no sob at rest Still mildly confused, can not remember the year Getting IHD, bladder scan,  awaiting renal recovery Possible CIR placement  Assessment/Plan: Active Problems:   COPD exacerbation (HCC)   OSA on CPAP   Cardiac arrest ([facility])   Lactic acidosis   Transaminitis   Acute on chronic respiratory failure with hypercapnia (HCC)    Cardiac arrest/anoxic brain injury -improving,  Acute on chronic hypoxic respiratory failure/copd exacerbation/aspiration pneumonia/H flu improving   AKI likely ischemic ATN: Negative kidney ultrasound on 4/27,    Started on dialysis change 4/28 Awaiting renal recovery , appreciate nephrology input  Anemia: Normocytic hgb 10 (hgb 13 on presentation) No sign of bleeding, possibly due to aki, frequent blood draw  Mild elevated lft Likely due to ischemia secondary to cardiac arrest  Body mass index is 36.74 kg/m.  Code Status: full  Family Communication: patient   Disposition Plan: CIR   Consultants:  Critical care admit  CIR  nephrology  Procedures:  Intubation/extubation  dialysis  Antibiotics:  As above   Objective: BP (!) 145/132   Pulse (!) 113   Temp 98 F (36.7 C) (Oral)   Resp (!) 28   Ht 5' 1"  (1.549 m)   Wt 88.2 kg   SpO2 99%   BMI 36.74 kg/m   Intake/Output Summary (Last 24 hours) at 05/22/2018 1157 Last data filed at 05/22/2018 0600 Gross per 24 hour  Intake 120 ml  Output 50 ml  Net 70 ml   Filed Weights   05/20/18 2021 05/21/18 0500 05/22/18 0500  Weight: (P) 92.3 kg 88.8 kg 88.2 kg    Exam: Patient is examined daily including today on 05/22/2018, exams remain the same as of yesterday except that has changed    General:  NAD, slightly confused, pleasant and cooperative  Cardiovascular: RRR  Respiratory: CTABL  Abdomen: Soft/ND/NT,  positive BS  Musculoskeletal: No Edema  Neuro: alert, slightly confused, can not remember the year  Data Reviewed: Basic Metabolic Panel: Recent Labs  Lab 05/16/18 0504 05/17/18 0538 05/18/18 0314  05/19/18 0226 05/19/18 0454 05/19/18 0523 05/19/18 1215 05/20/18 0923 05/21/18 0430 05/22/18 0337  NA 139 138 136   < > 138 134*  --  137 137 139 138  K 3.7 4.5 5.0   < > 6.0* 6.0* 6.1* 5.1 5.3* 4.1 5.1  CL 92* 95* 97*   < > 95*  --   --  95* 96* 96* 99  CO2 28 28 26    < > 24  --   --  25 25 28 26   GLUCOSE 166* 142* 144*   < > 125*  --   --  166* 118* 75 73  BUN 133* 106* 141*   < > 163*  --   --  97* 120* 45* 65*  CREATININE 6.03* 5.07* 6.05*   < > 7.02*  --   --  5.01* 6.25* 3.79* 5.19*  CALCIUM 8.9 8.3* 8.6*   < > 8.9  --   --  8.9 8.8* 8.8* 8.2*  MG 2.8* 2.3 2.5*  --  2.6*  --   --   --  2.4 2.1 2.2  PHOS 2.5 3.7 4.7*  --  6.8*  --   --   --   --   --   --    < > = values  in this interval not displayed.   Liver Function Tests: Recent Labs  Lab 05/16/18 0504 05/17/18 0538 05/18/18 0314 05/19/18 0226 05/21/18 0430  AST 483* 178* 89* 64* 55*  ALT 2,195* 1,461* 1,017* 748* 435*  ALKPHOS 122 100 88 81 68  BILITOT 0.7 0.4 0.5 0.8 0.7  PROT 6.1* 5.9* 5.7* 5.6* 5.8*  ALBUMIN 2.6* 2.4* 2.3* 2.5*  2.4* 2.7*   No results for input(s): LIPASE, AMYLASE in the last 168 hours. Recent Labs  Lab 05/17/18 0700 05/20/18 1231  AMMONIA 37* 35   CBC: Recent Labs  Lab 05/18/18 0314  05/19/18 0226 05/19/18 0454 05/20/18 0923 05/21/18 0430 05/22/18 0337  WBC 19.4*  --  16.0*  --  15.7* 12.2* 12.7*  HGB 11.1*   < > 10.6* 9.9* 10.8* 10.0* 10.0*  HCT 34.5*   < > 33.0* 29.0* 34.3* 31.7* 31.2*  MCV 92.7  --  92.7  --  94.5 94.6 94.8  PLT 145*  --  131*  --  162 158 180   < > = values in this interval not displayed.   Cardiac Enzymes:   Recent Labs  Lab 05/19/18 0226  CKTOTAL 276*   BNP (last 3 results) No results for input(s): BNP in the last 8760 hours.  ProBNP (last  3 results) No results for input(s): PROBNP in the last 8760 hours.  CBG: Recent Labs  Lab 05/22/18 0004 05/22/18 0338 05/22/18 0419 05/22/18 0734 05/22/18 1123  GLUCAP 84 60* 104* 83 121*    Recent Results (from the past 240 hour(s))  SARS Coronavirus 2 [facility] order, Performed in [place] hospital lab)     Status: None   Collection Time: 05/12/18  9:30 PM  Result Value Ref Range Status   SARS Coronavirus 2 NEGATIVE NEGATIVE Final    Comment: (NOTE) If result is NEGATIVE SARS-CoV-2 target nucleic acids are NOT DETECTED. The SARS-CoV-2 RNA is generally detectable in upper and lower  respiratory specimens during the acute phase of infection. The lowest  concentration of SARS-CoV-2 viral copies this assay can detect is 250  copies / mL. A negative result does not preclude SARS-CoV-2 infection  and should not be used as the sole basis for treatment or other  patient management decisions.  A negative result may occur with  improper specimen collection / handling, submission of specimen other  than nasopharyngeal swab, presence of viral mutation(s) within the  areas targeted by this assay, and inadequate number of viral copies  (<250 copies / mL). A negative result must be combined with clinical  observations, patient history, and epidemiological information. If result is POSITIVE SARS-CoV-2 target nucleic acids are DETECTED. The SARS-CoV-2 RNA is generally detectable in upper and lower  respiratory specimens dur ing the acute phase of infection.  Positive  results are indicative of active infection with SARS-CoV-2.  Clinical  correlation with patient history and other diagnostic information is  necessary to determine patient infection status.  Positive results do  not rule out bacterial infection or co-infection with other viruses. If result is PRESUMPTIVE POSTIVE SARS-CoV-2 nucleic acids MAY BE PRESENT.   A presumptive positive result was obtained on the submitted  specimen  and confirmed on repeat testing.  While 2019 novel coronavirus  (SARS-CoV-2) nucleic acids may be present in the submitted sample  additional confirmatory testing may be necessary for epidemiological  and / or clinical management purposes  to differentiate between  SARS-CoV-2 and other Sarbecovirus currently known to infect humans.  If clinically indicated  additional testing with an alternate test  methodology [phone]) is advised. The SARS-CoV-2 RNA is generally  detectable in upper and lower respiratory sp ecimens during the acute  phase of infection. The expected result is Negative. Fact Sheet for Patients:  StrictlyIdeas.no Fact Sheet for Healthcare Providers: BankingDealers.co.za This test is not yet approved or cleared by the [place] FDA and has been authorized for detection and/or diagnosis of SARS-CoV-2 by FDA under an Emergency Use Authorization (EUA).  This EUA will remain in effect (meaning this test can be used) for the duration of the COVID-19 declaration under Section 564(b)(1) of the Act, 21 U.S.C. section 360bbb-3(b)(1), unless the authorization is terminated or revoked sooner. Performed at [facility], [place] [address], [place], [place] [address]   Blood Culture (routine x 2)     Status: None   Collection Time: 05/12/18  9:40 PM  Result Value Ref Range Status   Specimen Description BLOOD RIGHT HAND  Final   Special Requests   Final    BOTTLES DRAWN AEROBIC AND ANAEROBIC Blood Culture adequate volume   Culture   Final    NO GROWTH 5 DAYS Performed at [facility], [place] [address], [place], [place] [address]    Report Status 05/17/2018 FINAL  Final  Blood Culture (routine x 2)     Status: None   Collection Time: 05/12/18 10:00 PM  Result Value Ref Range Status   Specimen Description BLOOD RIGHT HAND  Final   Special Requests   Final    BOTTLES DRAWN AEROBIC AND ANAEROBIC Blood Culture  results may not be optimal due to an inadequate volume of blood received in culture bottles   Culture   Final    NO GROWTH 5 DAYS Performed at [facility], [place] [address] [place], [place] [address]    Report Status 05/17/2018 FINAL  Final  MRSA PCR Screening     Status: None   Collection Time: 05/13/18  1:29 AM  Result Value Ref Range Status   MRSA by PCR NEGATIVE NEGATIVE Final    Comment:        The GeneXpert MRSA Assay (FDA approved for NASAL specimens only), is one component of a comprehensive MRSA colonization surveillance program. It is not intended to diagnose MRSA infection nor to guide or monitor treatment for MRSA infections. Performed at [facility], [place] [address], [place],  [address]   Respiratory Panel by PCR     Status: None   Collection Time: 05/13/18  2:33 AM  Result Value Ref Range Status   Adenovirus NOT DETECTED NOT DETECTED Final   Coronavirus 229E NOT DETECTED NOT DETECTED Final    Comment: (NOTE) The Coronavirus on the Respiratory Panel, DOES NOT test for the novel  Coronavirus (2019 nCoV)    Coronavirus HKU1 NOT DETECTED NOT DETECTED Final   Coronavirus NL63 NOT DETECTED NOT DETECTED Final   Coronavirus OC43 NOT DETECTED NOT DETECTED Final   Metapneumovirus NOT DETECTED NOT DETECTED Final   Rhinovirus / Enterovirus NOT DETECTED NOT DETECTED Final   Influenza A NOT DETECTED NOT DETECTED Final   Influenza B NOT DETECTED NOT DETECTED Final   Parainfluenza Virus 1 NOT DETECTED NOT DETECTED Final   Parainfluenza Virus 2 NOT DETECTED NOT DETECTED Final   Parainfluenza Virus 3 NOT DETECTED NOT DETECTED Final   Parainfluenza Virus 4 NOT DETECTED NOT DETECTED Final   Respiratory Syncytial Virus NOT DETECTED NOT DETECTED Final   Bordetella pertussis NOT  DETECTED NOT DETECTED Final   Chlamydophila pneumoniae NOT DETECTED NOT DETECTED Final   Mycoplasma pneumoniae NOT DETECTED NOT DETECTED Final    Comment: Performed at [facility], [place] [address], [place], [place] [address]  Culture, respiratory (non-expectorated)     Status: None   Collection Time: 05/13/18  2:33 AM  Result Value Ref Range Status   Specimen Description TRACHEAL ASPIRATE  Final   Special Requests Normal  Final   Gram Stain   Final    FEW WBC PRESENT, PREDOMINANTLY PMN NO ORGANISMS SEEN    Culture   Final    FEW HAEMOPHILUS INFLUENZAE BETA LACTAMASE NEGATIVE Performed at [facility], [place] [address], [place], [place] [address]    Report Status 05/15/2018 FINAL  Final  Culture, blood (routine x 2)     Status: None (Preliminary result)   Collection Time: 05/21/18 11:25 AM  Result Value Ref Range Status   Specimen Description BLOOD LEFT HAND  Final   Special Requests   Final    BOTTLES DRAWN AEROBIC ONLY Blood Culture adequate volume   Culture   Final    NO GROWTH < 24 HOURS Performed at [facility], [place] [address], [place], [place] [address]    Report Status PENDING  Incomplete  Culture, blood (routine x 2)     Status: None (Preliminary result)   Collection Time: 05/21/18 11:36 AM  Result Value Ref Range Status   Specimen Description BLOOD RIGHT HAND  Final   Special Requests   Final    BOTTLES DRAWN AEROBIC ONLY Blood Culture adequate volume   Culture   Final    NO GROWTH < 24 HOURS Performed at [facility], [place] [address] [place], [place] [address]    Report Status PENDING  Incomplete     Studies: Mr Brain Wo Contrast  Result Date: 05/21/2018 CLINICAL DATA:  Concern for anoxic injury. EXAM: MRI HEAD WITHOUT CONTRAST TECHNIQUE: Multiplanar, multiecho pulse sequences of the brain and surrounding structures were obtained without intravenous contrast. COMPARISON:  05/14/2018 FINDINGS: Brain: No infarct is seen. On FLAIR imaging and a few T2 weighted slices there is question of cortical edema, but no clear swelling/mass effect and no change in sulci appearance since admission head CT. Some of this FLAIR signal  may be related to motion and oxygenation. No hydrocephalus, mass, collection, or blood products. Vascular: Major flow voids are preserved. Skull and upper cervical spine: Negative for marrow lesion Sinuses/Orbits: Opacified sphenoid sinuses and right maxillary fluid level. Patchy bilateral mastoid opacification. IMPRESSION: 1. Negative for infarct or definitive global anoxic injury. 2. Per chart a temporal lobe lesion is questioned but again not visualized. Contrast was not administered in the setting of profound acute renal failure. 3. Sinusitis. Electronically Signed   By: Monte Fantasia M.D.   On: 05/21/2018 15:28    Scheduled Meds: . ALPRAZolam  1 mg Oral TID  . budesonide (PULMICORT) nebulizer solution  0.25 mg Nebulization BID  . chlorhexidine gluconate (MEDLINE KIT)  15 mL Mouth Rinse BID  . Chlorhexidine Gluconate Cloth  6 each Topical Daily  . Chlorhexidine Gluconate Cloth  6 each Topical Q0600  . escitalopram  10 mg Oral Daily  . feeding supplement (ENSURE ENLIVE)  237 mL Oral BID BM  . heparin  5,000 Units Subcutaneous Q8H  . insulin aspart  0-15 Units Subcutaneous Q4H  . ipratropium-albuterol  3 mL Nebulization Q6H  . mouth rinse  15 mL Mouth  Rinse BID  . methylPREDNISolone (SOLU-MEDROL) injection  40 mg Intravenous Daily  . pantoprazole (PROTONIX) IV  40 mg Intravenous q morning - 10a  . sodium polystyrene  30 g Rectal Once    Continuous Infusions: . sodium chloride    . sodium chloride       Time spent: 4mns I have personally reviewed and interpreted on  05/22/2018 daily labs, tele strips, imagings as discussed above under date review session and assessment and plans.  I reviewed all nursing notes, pharmacy notes, consultant notes,  vitals, pertinent old records  I have discussed plan of care as described above with RN , patient on 05/22/2018   FFlorencia ReasonsMD, PhD  [facility] Pager [phone] If 7PM-7AM, please contact night-coverage at [email or web address], password  [email or web address]5/04/2018, 11:57 AM  LOS: 10 days

## 2018-05-22 NOTE — Progress Notes (Signed)
Inpatient Diabetes Program Recommendations  AACE/ADA: New Consensus Statement on Inpatient Glycemic Control (2015)  Target Ranges:  Prepandial:   less than 140 mg/dL      Peak postprandial:   less than 180 mg/dL (1-2 hours)      Critically ill patients:  140 - 180 mg/dL   Lab Results  Component Value Date   GLUCAP 121 (H) 05/22/2018    Review of Glycemic Control Results for DEEANNE, DEININGER (MRN [id number]) as of 05/22/2018 13:31  Ref. Range 05/22/2018 00:04 05/22/2018 03:38 05/22/2018 04:19 05/22/2018 07:34 05/22/2018 11:23  Glucose-Capillary Latest Ref Range: 70 - 99 mg/dL 84 60 (L) 104 (H) 83 121 (H)   Diabetes history: No prior hx noted Current orders for Inpatient glycemic control: Novolog moderate correction q 4 hrs  Inpatient Diabetes Program Recommendations:   Hypoglycemia post correction given. -Decrease Novolog correction to sensitive or custom scale 0-5 units -Custom Novolog correction scale 0-5 units       151-200  1 unit      201-250  2 units      251-300  3 units      301-350  4 units      351-400  5 units  Thank you, Bethena Roys E. Edras Wilford, RN, MSN, CDE  Diabetes Coordinator Inpatient Glycemic Control Team Team Pager [phone] (8am-5pm) 05/22/2018 1:35 PM

## 2018-05-22 NOTE — Consult Note (Signed)
Physical Medicine and Rehabilitation Consult Reason for Consult: Decreased functional mobility Referring Physician: [facility]   HPI: Misty Green is a 48 y.o. female with history of COPD and uses CPAP, diastolic congestive heart failure, spinal stenosis, diverticulosis status post colostomy, opioid abuse.  Per chart review patient lives with spouse.  One level home with 4 steps to entry.  Independent with all ADLs tasks and mobility takes care of 3 grandchildren.  Presented 05/12/2018 with nasal congestion and occasional shortness of breath.  Patient with increasing headache and decrease in appetite complaints of hot flashes.  She was found unresponsive family called 911.  First responders did CPR for approximately 15 minutes.  Patient did require intubation/ventilatory support.  COVID negative.  Placed on vasopressors.  CT/MRI the brain negative for acute changes.  EEG negative for seizure.  Echocardiogram with ejection fraction of 83% normal systolic function.  Renal services consulted 05/15/2018 for elevated creatinine baseline 1.4-4.25.  AKI likely ischemic ATN related to cardiac arrest.  She has been receiving dialysis.  Patient did complete a course of antibiotics for aspiration pneumonia.  Noted delirium agitation suspect acute encephalopathy due to cardiac arrest.  Dysphasia #2 nectar thick liquids.  Subcutaneous heparin for DVT prophylaxis.  Therapy evaluations completed recommendations of physical medicine rehab consult.   Review of Systems  Unable to perform ROS: Acuity of condition   Past Medical History:  Diagnosis Date   CHF (congestive heart failure) (HCC)    COPD (chronic obstructive pulmonary disease) ([place])    Current smoker    Opiate abuse, continuous ([place])    Past Surgical History:  Procedure Laterality Date   ABDOMINAL HYSTERECTOMY     BACK SURGERY     cesction     COLOSTOMY  01/13/2018   Procedure: COLOSTOMY Creation;  Surgeon: Jules Husbands, MD;   Location: [facility] ORS;  Service: General;;   INCISIONAL HERNIA REPAIR     lower midline laparotomy incision (hysterectomy), repaired with mesh   LAPAROSCOPIC CHOLECYSTECTOMY  2012   LAPAROSCOPIC LYSIS OF ADHESIONS  01/13/2018   Procedure: LAPAROSCOPIC LYSIS OF ADHESIONS;  Surgeon: Jules Husbands, MD;  Location: [facility] ORS;  Service: General;;   LAPAROSCOPIC SIGMOID COLECTOMY  01/13/2018   Procedure: LAPAROSCOPIC SIGMOID COLECTOMY;  Surgeon: Jules Husbands, MD;  Location: [facility] ORS;  Service: General;;   ORIF WRIST FRACTURE Right 08/25/2015   Procedure: OPEN REDUCTION INTERNAL FIXATION (ORIF) WRIST FRACTURE;  Surgeon: Corky Mull, MD;  Location: [facility] ORS;  Service: Orthopedics;  Laterality: Right;   Family History  Problem Relation Age of Onset   COPD Mother    Hypertension Father    Diabetes Father    Social History:  reports that she has quit smoking. Her smoking use included cigarettes. She has a 15.00 pack-year smoking history. She has never used smokeless tobacco. She reports current drug use. Drug: Marijuana. She reports that she does not drink alcohol. Allergies:  Allergies  Allergen Reactions   Other Nausea And Vomiting    Anti-depressent that starts with t   Medications Prior to Admission  Medication Sig Dispense Refill   acetaminophen (TYLENOL) 500 MG tablet Take 500 mg by mouth every 6 (six) hours as needed for mild pain or fever.     albuterol (PROVENTIL HFA;VENTOLIN HFA) 108 (90 Base) MCG/ACT inhaler Inhale into the lungs every 6 (six) hours as needed for wheezing or shortness of breath.     ALPRAZolam (XANAX) 1 MG tablet Take 1 mg by mouth  4 (four) times daily.     Ascorbic Acid (VITAMIN C) 1000 MG tablet Take 1,000 mg by mouth daily.     diphenhydrAMINE (BENADRYL) 50 MG capsule Take 50 mg by mouth at bedtime as needed for sleep.     escitalopram (LEXAPRO) 10 MG tablet Take 10 mg by mouth daily.     Fluticasone-Salmeterol (ADVAIR) 250-50 MCG/DOSE AEPB Inhale 1  puff into the lungs 2 (two) times daily.     furosemide (LASIX) 40 MG tablet Take 40 mg by mouth daily as needed for fluid.      Multiple Vitamins-Minerals (MULTIVITAMIN WITH MINERALS) tablet Take 1 tablet by mouth daily.     pantoprazole (PROTONIX) 40 MG tablet Take 40 mg by mouth daily.     roflumilast (DALIRESP) 500 MCG TABS tablet Take 500 mcg by mouth daily.     tiotropium (SPIRIVA) 18 MCG inhalation capsule Place 18 mcg into inhaler and inhale daily.     cyclobenzaprine (FLEXERIL) 5 MG tablet Take 1 tablet (5 mg total) by mouth 3 (three) times daily. (Patient not taking: Reported on 05/13/2018) 30 tablet 0   docusate sodium (COLACE) 100 MG capsule Take 100 mg by mouth 2 (two) times daily.     gabapentin (NEURONTIN) 300 MG capsule Take 800 mg by mouth 3 (three) times daily.      metaxalone (SKELAXIN) 400 MG tablet Take 1 tablet (400 mg total) by mouth every 8 (eight) hours as needed for muscle spasms. (Patient not taking: Reported on 05/13/2018) 20 tablet 0   ondansetron (ZOFRAN ODT) 4 MG disintegrating tablet Take 1 tablet (4 mg total) by mouth every 8 (eight) hours as needed for nausea or vomiting. (Patient not taking: Reported on 05/13/2018) 20 tablet 0   ondansetron (ZOFRAN) 8 MG tablet Take 1 tablet (8 mg total) by mouth every 8 (eight) hours as needed for nausea or vomiting. 20 tablet 0   zolpidem (AMBIEN) 10 MG tablet Take 10 mg by mouth at bedtime as needed for sleep.      Home: Home Living Family/patient expects to be discharged to:: Private residence Living Arrangements: Spouse/significant other Available Help at Discharge: Family, Industrial/product designer, Available PRN/intermittently Type of Home: House Home Access: Stairs to enter [employer] of Steps: 5 back; 4 in front Entrance Stairs-Rails: Can reach both Home Layout: One level Bathroom Shower/Tub: Chiropodist: Handicapped height Home Equipment: None  Functional History: Prior Function Level  of Independence: Independent Comments: Independent with all ADL/IADL tasks and mobility; takes care of her 3 grandchildren per admit in 2019 Functional Status:  Mobility: Bed Mobility Overal bed mobility: Needs Assistance Bed Mobility: Supine to Sit Supine to sit: Min assist General bed mobility comments: assist for trunk elevation today Transfers Overall transfer level: Needs assistance Equipment used: 4-wheeled walker(EVA walker) Transfers: Sit to/from Stand Sit to Stand: Min assist, +2 physical assistance, Mod assist Stand pivot transfers: Min assist, Mod assist, +2 physical assistance General transfer comment: asisst for boost and balance, Pt pulling up on EVA walker.      ADL: ADL Overall ADL's : Needs assistance/impaired Eating/Feeding: Moderate assistance, Bed level(in chair position) Grooming: Brushing hair, Maximal assistance, Bed level(chair position) Grooming Details (indicate cue type and reason): matting noted in hair. Shampoo cap applied and attempted to de-tangle with comb. no success today. RN aware. Pt will need detangler and significant amt of time to work on matting- potentially might need it cut out Upper Body Bathing: Minimal assistance Lower Body Bathing: Maximal assistance Upper Body  Dressing : Minimal assistance Lower Body Dressing: Maximal assistance Lower Body Dressing Details (indicate cue type and reason): to don socks Toilet Transfer: Minimal assistance, +2 for physical assistance, +2 for safety/equipment, Ambulation(EVA walker) Toilet Transfer Details (indicate cue type and reason): face to face transfer Toileting- Clothing Manipulation and Hygiene: Maximal assistance, Sit to/from stand Toileting - Clothing Manipulation Details (indicate cue type and reason): Pt with colostomy bag at baseline, no balance for perianal care Functional mobility during ADLs: +2 for safety/equipment, Minimal assistance General ADL Comments: confusion/cognition, and  decreased access to LB for ADL, decreased balance  Cognition: Cognition Overall Cognitive Status: Impaired/Different from baseline Orientation Level: Oriented to person, Disoriented to place, Disoriented to time, Disoriented to situation Cognition Arousal/Alertness: Awake/alert Behavior During Therapy: Flat affect Overall Cognitive Status: Impaired/Different from baseline Area of Impairment: Memory, Following commands, Safety/judgement, Awareness, Problem solving Orientation Level: Disoriented to, Situation, Time, Place Memory: Decreased short-term memory, Decreased recall of precautions Following Commands: Follows one step commands inconsistently, Follows one step commands with increased time Safety/Judgement: Decreased awareness of safety, Decreased awareness of deficits Awareness: Intellectual Problem Solving: Slow processing, Decreased initiation, Difficulty sequencing, Requires verbal cues, Requires tactile cues General Comments: easily distracted, inreased lethargy today  Blood pressure (!) 145/132, pulse (!) 113, temperature 98 F (36.7 C), temperature source Oral, resp. rate (!) 28, height 5\' 1"  (1.549 m), weight 88.2 kg, SpO2 99 %. Physical Exam  Constitutional: No distress.  HENT:  Head: Normocephalic.  Eyes: Pupils are equal, round, and reactive to light.  Neck: Normal range of motion.  Cardiovascular:  tachycardic  Respiratory: Effort normal.  GI: Soft.  Neurological:  Patient is very lethargic but arousable.  Made eye contact but remained nonverbal.  She did not follow exams are made during assessment.  Overall exam was limited, seems to move all 4 spontaneously  Skin: Skin is warm.  Psychiatric:  Flat, slow to engage    Results for orders placed or performed during the hospital encounter of 05/12/18 (from the past 24 hour(s))  Glucose, capillary     Status: None   Collection Time: 05/21/18 12:12 PM  Result Value Ref Range   Glucose-Capillary 86 70 - 99 mg/dL    Glucose, capillary     Status: Abnormal   Collection Time: 05/21/18  4:22 PM  Result Value Ref Range   Glucose-Capillary 135 (H) 70 - 99 mg/dL   Comment 1 Notify RN   Glucose, capillary     Status: Abnormal   Collection Time: 05/21/18  8:34 PM  Result Value Ref Range   Glucose-Capillary 132 (H) 70 - 99 mg/dL  Glucose, capillary     Status: None   Collection Time: 05/22/18 12:04 AM  Result Value Ref Range   Glucose-Capillary 84 70 - 99 mg/dL  Magnesium     Status: None   Collection Time: 05/22/18  3:37 AM  Result Value Ref Range   Magnesium 2.2 1.7 - 2.4 mg/dL  CBC     Status: Abnormal   Collection Time: 05/22/18  3:37 AM  Result Value Ref Range   WBC 12.7 (H) 4.0 - 10.5 K/uL   RBC 3.29 (L) 3.87 - 5.11 MIL/uL   Hemoglobin 10.0 (L) 12.0 - 15.0 g/dL   HCT 31.2 (L) 36.0 - 46.0 %   MCV 94.8 80.0 - 100.0 fL   MCH 30.4 26.0 - 34.0 pg   MCHC 32.1 30.0 - 36.0 g/dL   RDW 13.5 11.5 - 15.5 %   Platelets 180 150 - 400  K/uL   nRBC 0.0 0.0 - 0.2 %  Basic metabolic panel     Status: Abnormal   Collection Time: 05/22/18  3:37 AM  Result Value Ref Range   Sodium 138 135 - 145 mmol/L   Potassium 5.1 3.5 - 5.1 mmol/L   Chloride 99 98 - 111 mmol/L   CO2 26 22 - 32 mmol/L   Glucose, Bld 73 70 - 99 mg/dL   BUN 65 (H) 6 - 20 mg/dL   Creatinine, Ser 5.19 (H) 0.44 - 1.00 mg/dL   Calcium 8.2 (L) 8.9 - 10.3 mg/dL   GFR calc non Af Amer 9 (L) >60 mL/min   GFR calc Af Amer 11 (L) >60 mL/min   Anion gap 13 5 - 15  Glucose, capillary     Status: Abnormal   Collection Time: 05/22/18  3:38 AM  Result Value Ref Range   Glucose-Capillary 60 (L) 70 - 99 mg/dL  Glucose, capillary     Status: Abnormal   Collection Time: 05/22/18  4:19 AM  Result Value Ref Range   Glucose-Capillary 104 (H) 70 - 99 mg/dL  Glucose, capillary     Status: None   Collection Time: 05/22/18  7:34 AM  Result Value Ref Range   Glucose-Capillary 83 70 - 99 mg/dL  Glucose, capillary     Status: Abnormal   Collection Time:  05/22/18 11:23 AM  Result Value Ref Range   Glucose-Capillary 121 (H) 70 - 99 mg/dL   Mr Brain Wo Contrast  Result Date: 05/21/2018 CLINICAL DATA:  Concern for anoxic injury. EXAM: MRI HEAD WITHOUT CONTRAST TECHNIQUE: Multiplanar, multiecho pulse sequences of the brain and surrounding structures were obtained without intravenous contrast. COMPARISON:  05/14/2018 FINDINGS: Brain: No infarct is seen. On FLAIR imaging and a few T2 weighted slices there is question of cortical edema, but no clear swelling/mass effect and no change in sulci appearance since admission head CT. Some of this FLAIR signal may be related to motion and oxygenation. No hydrocephalus, mass, collection, or blood products. Vascular: Major flow voids are preserved. Skull and upper cervical spine: Negative for marrow lesion Sinuses/Orbits: Opacified sphenoid sinuses and right maxillary fluid level. Patchy bilateral mastoid opacification. IMPRESSION: 1. Negative for infarct or definitive global anoxic injury. 2. Per chart a temporal lobe lesion is questioned but again not visualized. Contrast was not administered in the setting of profound acute renal failure. 3. Sinusitis. Electronically Signed   By: Monte Fantasia M.D.   On: 05/21/2018 15:28     Assessment/Plan: Diagnosis: debility and encephalopathy 1. Does the need for close, 24 hr/day medical supervision in concert with the patient's rehab needs make it unreasonable for this patient to be served in a less intensive setting? Yes 2. Co-Morbidities requiring supervision/potential complications: COPD, dCHF, renal failure on HD, dysphagia 3. Due to bladder management, bowel management, safety, skin/wound care, disease management, medication administration, pain management and patient education, does the patient require 24 hr/day rehab nursing? Yes 4. Does the patient require coordinated care of a physician, rehab nurse, PT (1-2 hrs/day, 5 days/week), OT (1-2 hrs/day, 5 days/week) and  SLP (1-2 hrs/day, 5 days/week) to address physical and functional deficits in the context of the above medical diagnosis(es)? Yes Addressing deficits in the following areas: balance, endurance, locomotion, strength, transferring, bowel/bladder control, bathing, dressing, feeding, grooming, toileting, cognition, speech, swallowing and psychosocial support 5. Can the patient actively participate in an intensive therapy program of at least 3 hrs of therapy per day at least 5  days per week? Yes 6. The potential for patient to make measurable gains while on inpatient rehab is excellent 7. Anticipated functional outcomes upon discharge from inpatient rehab are supervision and min assist  with PT, supervision and min assist with OT, supervision and min assist with SLP. 8. Estimated rehab length of stay to reach the above functional goals is: potentially 20-30 days 9. Anticipated D/C setting: Home 10. Anticipated post D/C treatments: [place] therapy 11. Overall Rehab/Functional Prognosis: excellent  RECOMMENDATIONS: This patient's condition is appropriate for continued rehabilitative care in the following setting: CIR Patient has agreed to participate in recommended program. N/A Note that insurance prior authorization may be required for reimbursement for recommended care.  Comment: Rehab Admissions Coordinator to follow up.  Thanks,  Meredith Staggers, MD, Mellody Drown  I have personally performed a face to face diagnostic evaluation of this patient. Additionally, I have examined pertinent labs and radiographic images. I have reviewed and concur with the physician assistant's documentation above.    Lavon Paganini Angiulli, PA-C 05/22/2018

## 2018-05-22 NOTE — Plan of Care (Signed)
  Problem: Clinical Measurements: Goal: Will remain free from infection Outcome: Progressing   Problem: Coping: Goal: Level of anxiety will decrease Outcome: Progressing   Problem: Elimination: Goal: Will not experience complications related to bowel motility Outcome: Progressing   Problem: Safety: Goal: Ability to remain free from injury will improve Outcome: Progressing   Problem: Skin Integrity: Goal: Risk for impaired skin integrity will decrease Outcome: Progressing

## 2018-05-22 NOTE — Progress Notes (Addendum)
Pt arrived on unit, on 5l [place], skin checked with Primitivo Gauze, Therapist, sports. Pt will be placed on PC monitor.  Addendum: pt's daughter called [phone]

## 2018-05-22 NOTE — Progress Notes (Signed)
SLP Cancellation Note  Patient Details Name: DAINA CARA MRN: [id number] DOB: 08-29-1970   Cancelled treatment:       Reason Eval/Treat Not Completed: Patient at procedure or test/unavailable(HD)   Venita Sheffield Melecio Cueto 05/22/2018, 3:42 PM  [name], M.A. [place] Acute Environmental education officer [phone] Office [phone]

## 2018-05-22 NOTE — Progress Notes (Signed)
Occupational Therapy Treatment Patient Details Name: Misty Green MRN: [id number] DOB: [birth year] Today's Date: 05/22/2018    History of present illness 48 yr old F w/ PMHx COPD, CHF, chronic back pain from spinal stenoses, diverticulosis s/p colostomy presents s/p cardiac arrest.    OT comments  Pt progressing towards OT goals this session. More lethargic, required increased arousal techniques - full lights, music, etc. Pt max A for grooming in chair position in bed this morning. Noted matting in hair, did shampoo cap and comb and made VERY little progress - will need extra time to sit and work on matting or potentially cut it out. Pt was min A +2 for transfers. Able to participate in hallway ambulation. Continues to be confused and have trouble following direction, mod A for eating at the end of session. OT will continue to follow CIR remains appropriate, and essential.    Follow Up Recommendations  CIR;Supervision/Assistance - 24 hour    Equipment Recommendations  Other (comment)(defer to next venue of care)    Recommendations for Other Services      Precautions / Restrictions Precautions Precautions: Fall Restrictions Weight Bearing Restrictions: No       Mobility Bed Mobility Overal bed mobility: Needs Assistance Bed Mobility: Supine to Sit     Supine to sit: Min assist     General bed mobility comments: assist for trunk elevation today  Transfers Overall transfer level: Needs assistance Equipment used: 4-wheeled walker(EVA walker) Transfers: Sit to/from Stand Sit to Stand: Min assist;+2 physical assistance;Mod assist         General transfer comment: asisst for boost and balance, Pt pulling up on EVA walker.    Balance Overall balance assessment: Needs assistance Sitting-balance support: Feet supported;Single extremity supported Sitting balance-Leahy Scale: Fair     Standing balance support: Bilateral upper extremity supported;During functional  activity Standing balance-Leahy Scale: Poor Standing balance comment: bil UE suport and external support needed                           ADL either performed or assessed with clinical judgement   ADL Overall ADL's : Needs assistance/impaired Eating/Feeding: Moderate assistance;Bed level(in chair position)   Grooming: Brushing hair;Maximal assistance;Bed level(chair position) Grooming Details (indicate cue type and reason): matting noted in hair. Shampoo cap applied and attempted to de-tangle with comb. no success today. RN aware. Pt will need detangler and significant amt of time to work on matting- potentially might need it cut out                 Toilet Transfer: Minimal assistance;+2 for physical assistance;+2 for safety/equipment;Ambulation(EVA walker)   Toileting- Clothing Manipulation and Hygiene: Maximal assistance;Sit to/from stand Toileting - Clothing Manipulation Details (indicate cue type and reason): Pt with colostomy bag at baseline, no balance for perianal care     Functional mobility during ADLs: +2 for safety/equipment;Minimal assistance General ADL Comments: confusion/cognition, and decreased access to LB for ADL, decreased balance     Vision       Perception     Praxis      Cognition Arousal/Alertness: Awake/alert Behavior During Therapy: Flat affect Overall Cognitive Status: Impaired/Different from baseline Area of Impairment: Memory;Following commands;Safety/judgement;Awareness;Problem solving                     Memory: Decreased short-term memory;Decreased recall of precautions Following Commands: Follows one step commands inconsistently;Follows one step commands with increased time Safety/Judgement: Decreased awareness  of safety;Decreased awareness of deficits Awareness: Intellectual Problem Solving: Slow processing;Decreased initiation;Difficulty sequencing;Requires verbal cues;Requires tactile cues General Comments: easily  distracted, inreased lethargy today        Exercises     Shoulder Instructions       General Comments VSS - HR around 100 and stable, O2 on 6L for ambulation and above 95%    Pertinent Vitals/ Pain       Pain Assessment: Faces Faces Pain Scale: Hurts even more Pain Location: generalized - lower back Pain Descriptors / Indicators: Aching;Grimacing;Sore Pain Intervention(s): Monitored during session;Repositioned  Home Living                                          Prior Functioning/Environment              Frequency  Min 3X/week        Progress Toward Goals  OT Goals(current goals can now be found in the care plan section)  Progress towards OT goals: Progressing toward goals  Acute Rehab OT Goals Patient Stated Goal: to go home OT Goal Formulation: With patient Time For Goal Achievement: 06/02/18 Potential to Achieve Goals: Good  Plan Discharge plan remains appropriate    Co-evaluation    PT/OT/SLP Co-Evaluation/Treatment: Yes Reason for Co-Treatment: Necessary to address cognition/behavior during functional activity;For patient/therapist safety;To address functional/ADL transfers PT goals addressed during session: Mobility/safety with mobility;Balance;Proper use of DME OT goals addressed during session: ADL's and self-care      AM-PAC OT "6 Clicks" Daily Activity     Outcome Measure   Help from another person eating meals?: A Lot Help from another person taking care of personal grooming?: A Lot Help from another person toileting, which includes using toliet, bedpan, or urinal?: A Lot Help from another person bathing (including washing, rinsing, drying)?: A Lot Help from another person to put on and taking off regular upper body clothing?: A Lot Help from another person to put on and taking off regular lower body clothing?: A Lot 6 Click Score: 12    End of Session Equipment Utilized During Treatment: Gait belt;Oxygen(6L)  OT  Visit Diagnosis: Unsteadiness on feet (R26.81);Other abnormalities of gait and mobility (R26.89);Muscle weakness (generalized) (M62.81);Other symptoms and signs involving cognitive function   Activity Tolerance Patient tolerated treatment well   Patient Left with call bell/phone within reach;in bed;with bed alarm set   Nurse Communication Mobility status        Time: 3762-8315 OT Time Calculation (min): 36 min  Charges: OT General Charges $OT Visit: 1 Visit OT Treatments $Self Care/Home Management : 8-22 mins  Hulda Humphrey OTR/L Acute Rehabilitation Services Pager: [phone] Office: [place] 05/22/2018, 10:38 AM

## 2018-05-22 NOTE — Progress Notes (Signed)
Physical Therapy Treatment Patient Details Name: Misty Green MRN: [id number] DOB: [birth year] Today's Date: 05/22/2018    History of Present Illness 48 yr old F w/ PMHx COPD, CHF, chronic back pain from spinal stenoses, diverticulosis s/p colostomy presents s/p cardiac arrest.     PT Comments    Pt admitted with above diagnosis. Pt currently with functional limitations due to balance and endurance deficits. Pt was able to ambulate with EVa walker on unit.  Unsteady at times with scissoring and need for +2 mod assist but did progress ambulation.  Also incr pt from 4.5L to 6L with activity to keep sats >90%. Will follow acutely.  Pt will benefit from skilled PT to increase their independence and safety with mobility to allow discharge to the venue listed below.    Follow Up Recommendations  CIR;Supervision/Assistance - 24 hour     Equipment Recommendations  Other (comment)(TBA)    Recommendations for Other Services       Precautions / Restrictions Precautions Precautions: Fall Restrictions Weight Bearing Restrictions: No    Mobility  Bed Mobility Overal bed mobility: Needs Assistance Bed Mobility: Supine to Sit     Supine to sit: Min assist     General bed mobility comments: assist for trunk elevation today  Transfers Overall transfer level: Needs assistance Equipment used: 4-wheeled walker(EVA walker) Transfers: Sit to/from Stand Sit to Stand: Min assist;+2 physical assistance;Mod assist         General transfer comment: asisst for boost and balance, Pt pulling up on EVA walker.  Ambulation/Gait Ambulation/Gait assistance: Min assist;Mod assist;+2 physical assistance Gait Distance (Feet): 150 Feet Assistive device: (Eva walker) Gait Pattern/deviations: Scissoring;Staggering left;Staggering right;Narrow base of support;Trunk flexed;Leaning posteriorly;Step-through pattern;Decreased stride length   Gait velocity interpretation: <1.31 ft/sec, indicative of  household ambulator General Gait Details: Pt was able to walk with [name] walker with cues and assist for stability as pt was scissoring at times. Cues for safety.  unsteady at times.     Stairs             Wheelchair Mobility    Modified Rankin (Stroke Patients Only)       Balance Overall balance assessment: Needs assistance Sitting-balance support: Feet supported;Single extremity supported Sitting balance-Leahy Scale: Fair     Standing balance support: Bilateral upper extremity supported;During functional activity Standing balance-Leahy Scale: Poor Standing balance comment: bil UE suport and external support needed                            Cognition Arousal/Alertness: Awake/alert Behavior During Therapy: Flat affect Overall Cognitive Status: Impaired/Different from baseline Area of Impairment: Memory;Following commands;Safety/judgement;Awareness;Problem solving                 Orientation Level: Disoriented to;Situation;Time;Place   Memory: Decreased short-term memory;Decreased recall of precautions Following Commands: Follows one step commands inconsistently;Follows one step commands with increased time Safety/Judgement: Decreased awareness of safety;Decreased awareness of deficits Awareness: Intellectual Problem Solving: Slow processing;Decreased initiation;Difficulty sequencing;Requires verbal cues;Requires tactile cues General Comments: easily distracted, inreased lethargy today      Exercises General Exercises - Lower Extremity Long Arc Quad: AROM;Both;10 reps;Seated Hip Flexion/Marching: AROM;Both;10 reps;Seated    General Comments General comments (skin integrity, edema, etc.): VSS - HR around 100 and stable, O2 on 6L for ambulation and above 95%      Pertinent Vitals/Pain Pain Assessment: Faces Faces Pain Scale: Hurts even more Pain Location: generalized - lower back Pain Descriptors /  Indicators: Aching;Grimacing;Sore Pain  Intervention(s): Limited activity within patient's tolerance;Monitored during session;Premedicated before session;Repositioned    Home Living                      Prior Function            PT Goals (current goals can now be found in the care plan section) Acute Rehab PT Goals Patient Stated Goal: to go home Progress towards PT goals: Progressing toward goals    Frequency    Min 3X/week      PT Plan Current plan remains appropriate    Co-evaluation PT/OT/SLP Co-Evaluation/Treatment: Yes Reason for Co-Treatment: Necessary to address cognition/behavior during functional activity PT goals addressed during session: Mobility/safety with mobility OT goals addressed during session: ADL's and self-care      AM-PAC PT "6 Clicks" Mobility   Outcome Measure  Help needed turning from your back to your side while in a flat bed without using bedrails?: A Little Help needed moving from lying on your back to sitting on the side of a flat bed without using bedrails?: A Little Help needed moving to and from a bed to a chair (including a wheelchair)?: A Lot Help needed standing up from a chair using your arms (e.g., wheelchair or bedside chair)?: A Lot Help needed to walk in hospital room?: A Lot Help needed climbing 3-5 steps with a railing? : Total 6 Click Score: 13    End of Session Equipment Utilized During Treatment: Gait belt;Oxygen(6LO2) Activity Tolerance: Patient limited by fatigue Patient left: with call bell/phone within reach;in bed;with bed alarm set Nurse Communication: Mobility status PT Visit Diagnosis: Unsteadiness on feet (R26.81);Muscle weakness (generalized) (M62.81)     Time: 6834-1962 PT Time Calculation (min) (ACUTE ONLY): 27 min  Charges:  $Gait Training: 8-22 mins                     [place] Pager:  [phone]  Office:  [place] 05/22/2018, 1:02 PM

## 2018-05-22 NOTE — Progress Notes (Signed)
   05/22/18 0815  What Happened  Was fall witnessed? Yes  Who witnessed fall? Theda Belfast RN, Baltazar Najjar NT. Tanya RN  Patients activity before fall ambulating-unassisted;bathroom-unassisted  Point of contact buttocks  Was patient injured? No  Follow Up  Family notified Yes-comment (Spouse, [name])  Time family notified [phone]  Additional tests No  Simple treatment Other (comment) (Assessed)  Progress note created (see row info) Yes   Pt got out of bed, unassisted, not witnessed. Bed alarm was on. Pt stated that she wanted to use the bathroom. Met pt staggering towards bathroom, still hooked up to the equipment. Pt was held by this RN and NT to keep her steady. Pt then guided self to floor and sat on the ground. No injuries noted. No additional complaints at the time. Vital signs stable. Will continue to monitor.

## 2018-05-22 NOTE — Progress Notes (Signed)
[facility] NEPHROLOGY PROGRESS NOTE  Assessment/ Plan: Pt is a 48 y.o. yo female status post cardiac arrest of unclear duration, VDRF and anuric AKI.  # Anuric AKI likely ischemic ZLD:JTTSVXBL kidney ultrasound on 4/27, sub-nephrotic proteinuria.  Started on dialysis change 4/28.  Urine output recorded 825 cc.  Potassium 5.1 and creatinine level trending up to 5.19.  Plan for dialysis today.  Continue bladder scan and monitor for renal recovery.  #Cardiac arrest: Presumed due to respiratory arrest  #Acute on chronic respiratory failure with hypoxia: Currently on BiPAP  #Aspiration pneumonia, COPD exacerbation: Completed antibiotics course.  #Acute encephalopathy: Multifactorial etiology, following simple commands today.  Subjective:   Objective Vital signs in last 24 hours: Vitals:   05/22/18 0500 05/22/18 0600 05/22/18 0700 05/22/18 0738  BP: 136/74 (!) 143/71    Pulse: 82 80 84   Resp: 18 19 20    Temp:    98.3 F (36.8 C)  TempSrc:    Axillary  SpO2: 100% 99% 99%   Weight: 88.2 kg     Height:       Weight change: -7.1 kg  Intake/Output Summary (Last 24 hours) at 05/22/2018 0804 Last data filed at 05/22/2018 0600 Gross per 24 hour  Intake 120 ml  Output 875 ml  Net -755 ml       Labs: Basic Metabolic Panel: Recent Labs  Lab 05/17/18 0538 05/18/18 0314  05/19/18 0226  05/20/18 0923 05/21/18 0430 05/22/18 0337  NA 138 136   < > 138   < > 137 139 138  K 4.5 5.0   < > 6.0*   < > 5.3* 4.1 5.1  CL 95* 97*   < > 95*   < > 96* 96* 99  CO2 28 26   < > 24   < > 25 28 26   GLUCOSE 142* 144*   < > 125*   < > 118* 75 73  BUN 106* 141*   < > 163*   < > 120* 45* 65*  CREATININE 5.07* 6.05*   < > 7.02*   < > 6.25* 3.79* 5.19*  CALCIUM 8.3* 8.6*   < > 8.9   < > 8.8* 8.8* 8.2*  PHOS 3.7 4.7*  --  6.8*  --   --   --   --    < > = values in this interval not displayed.   Liver Function Tests: Recent Labs  Lab 05/18/18 0314 05/19/18 0226 05/21/18 0430   AST 89* 64* 55*  ALT 1,017* 748* 435*  ALKPHOS 88 81 68  BILITOT 0.5 0.8 0.7  PROT 5.7* 5.6* 5.8*  ALBUMIN 2.3* 2.5*  2.4* 2.7*   No results for input(s): LIPASE, AMYLASE in the last 168 hours. Recent Labs  Lab 05/17/18 0700 05/20/18 1231  AMMONIA 37* 35   CBC: Recent Labs  Lab 05/18/18 0314  05/19/18 0226  05/20/18 0923 05/21/18 0430 05/22/18 0337  WBC 19.4*  --  16.0*  --  15.7* 12.2* 12.7*  HGB 11.1*   < > 10.6*   < > 10.8* 10.0* 10.0*  HCT 34.5*   < > 33.0*   < > 34.3* 31.7* 31.2*  MCV 92.7  --  92.7  --  94.5 94.6 94.8  PLT 145*  --  131*  --  162 158 180   < > = values in this interval not displayed.   Cardiac Enzymes: Recent Labs  Lab 05/19/18 0226  CKTOTAL 276*   CBG: Recent  Labs  Lab 05/21/18 2034 05/22/18 0004 05/22/18 0338 05/22/18 0419 05/22/18 0734  GLUCAP 132* 84 60* 104* 83    Iron Studies: No results for input(s): IRON, TIBC, TRANSFERRIN, FERRITIN in the last 72 hours. Studies/Results: Mr Brain Wo Contrast  Result Date: 05/21/2018 CLINICAL DATA:  Concern for anoxic injury. EXAM: MRI HEAD WITHOUT CONTRAST TECHNIQUE: Multiplanar, multiecho pulse sequences of the brain and surrounding structures were obtained without intravenous contrast. COMPARISON:  05/14/2018 FINDINGS: Brain: No infarct is seen. On FLAIR imaging and a few T2 weighted slices there is question of cortical edema, but no clear swelling/mass effect and no change in sulci appearance since admission head CT. Some of this FLAIR signal may be related to motion and oxygenation. No hydrocephalus, mass, collection, or blood products. Vascular: Major flow voids are preserved. Skull and upper cervical spine: Negative for marrow lesion Sinuses/Orbits: Opacified sphenoid sinuses and right maxillary fluid level. Patchy bilateral mastoid opacification. IMPRESSION: 1. Negative for infarct or definitive global anoxic injury. 2. Per chart a temporal lobe lesion is questioned but again not visualized.  Contrast was not administered in the setting of profound acute renal failure. 3. Sinusitis. Electronically Signed   By: Monte Fantasia M.D.   On: 05/21/2018 15:28    Medications: Infusions: . sodium chloride    . sodium chloride      Scheduled Medications: . ALPRAZolam  1 mg Oral TID  . budesonide (PULMICORT) nebulizer solution  0.25 mg Nebulization BID  . chlorhexidine gluconate (MEDLINE KIT)  15 mL Mouth Rinse BID  . Chlorhexidine Gluconate Cloth  6 each Topical Daily  . Chlorhexidine Gluconate Cloth  6 each Topical Q0600  . escitalopram  10 mg Oral Daily  . feeding supplement (ENSURE ENLIVE)  237 mL Oral BID BM  . heparin  5,000 Units Subcutaneous Q8H  . insulin aspart  0-15 Units Subcutaneous Q4H  . ipratropium-albuterol  3 mL Nebulization Q6H  . mouth rinse  15 mL Mouth Rinse BID  . methylPREDNISolone (SOLU-MEDROL) injection  40 mg Intravenous Daily  . pantoprazole (PROTONIX) IV  40 mg Intravenous q morning - 10a  . sodium polystyrene  30 g Rectal Once    have reviewed scheduled and prn medications.  Physical Exam: General:NAD, on bipap Heart:RRR, s1s2 nl Lungs: Coarse breath sound bilateral, no wheezing Abdomen:soft, Non-tender, non-distended Extremities:No edema Dialysis Access: IJ catheter.    Prasad  05/22/2018,8:04 AM  LOS: 10 days

## 2018-05-23 DIAGNOSIS — G931 Anoxic brain damage, not elsewhere classified: Secondary | ICD-10-CM

## 2018-05-23 LAB — BASIC METABOLIC PANEL
Anion gap: 15 (ref 5–15)
BUN: 37 mg/dL — ABNORMAL HIGH (ref 6–20)
CO2: 25 mmol/L (ref 22–32)
Calcium: 9 mg/dL (ref 8.9–10.3)
Chloride: 97 mmol/L — ABNORMAL LOW (ref 98–111)
Creatinine, Ser: 4.16 mg/dL — ABNORMAL HIGH (ref 0.44–1.00)
GFR calc Af Amer: 14 mL/min — ABNORMAL LOW (ref >60)
GFR calc non Af Amer: 12 mL/min — ABNORMAL LOW (ref >60)
Glucose, Bld: 80 mg/dL (ref 70–99)
Potassium: 4 mmol/L (ref 3.5–5.1)
Sodium: 137 mmol/L (ref 135–145)

## 2018-05-23 LAB — GLUCOSE, CAPILLARY
Glucose-Capillary: 152 mg/dL — ABNORMAL HIGH (ref 70–99)
Glucose-Capillary: 69 mg/dL — ABNORMAL LOW (ref 70–99)
Glucose-Capillary: 80 mg/dL (ref 70–99)
Glucose-Capillary: 81 mg/dL (ref 70–99)
Glucose-Capillary: 83 mg/dL (ref 70–99)
Glucose-Capillary: 85 mg/dL (ref 70–99)
Glucose-Capillary: 91 mg/dL (ref 70–99)

## 2018-05-23 LAB — CBC
HCT: 33 % — ABNORMAL LOW (ref 36.0–46.0)
Hemoglobin: 10.3 g/dL — ABNORMAL LOW (ref 12.0–15.0)
MCH: 29.8 pg (ref 26.0–34.0)
MCHC: 31.2 g/dL (ref 30.0–36.0)
MCV: 95.4 fL (ref 80.0–100.0)
Platelets: 214 10*3/uL (ref 150–400)
RBC: 3.46 MIL/uL — ABNORMAL LOW (ref 3.87–5.11)
RDW: 13.4 % (ref 11.5–15.5)
WBC: 11.4 10*3/uL — ABNORMAL HIGH (ref 4.0–10.5)
nRBC: 0 % (ref 0.0–0.2)

## 2018-05-23 LAB — MAGNESIUM: Magnesium: 2.1 mg/dL (ref 1.7–2.4)

## 2018-05-23 MED ORDER — NEPRO/CARBSTEADY PO LIQD
237.0000 mL | Freq: Three times a day (TID) | ORAL | Status: DC
  Administered 2018-05-23 – 2018-05-27 (×5): 237 mL via ORAL

## 2018-05-23 MED ORDER — SODIUM CHLORIDE 0.9% FLUSH: 10.0000 mL | INTRAVENOUS | Status: DC | PRN

## 2018-05-23 NOTE — Progress Notes (Addendum)
Spoke with Misty Green's husband who states patient has been calling him multiple times telling him she is discharged and becoming upset with him that he is not coming to pick her up. I discussed plan of care with husband; he seems very optimistic that patient will be able to be discharged home. I attempted to educate spouse on realistic goals. When I then spoke with patient, she was easily reoriented that she is staying tonight in the hospital, but is also very optimistic about going home tomorrow. I discussed continued need for oxygen, for dialysis, for monitoring, and for strength building. Misty Green reorients but seems to have impaired awareness of limitations. In chair, chair alarm on.  Misty Green has slight extension tremor of bilateral arms noted on reaching for drinks. Will continue to reorient patient and reinforce plan of care.  Misty Green continues to note pain in chest mostly over caudal end of sternum. She is reluctant to cough - cough remains weak and loose. I taught patient about splinting cough to improve air movement and comfort. Misty Green expressed thanks for information but no sign of understanding. Will continue to reinforce. Pain in chest does not radiate, is not associated with coughing. Misty Green says it is tender to touch but also that it is not. She declines prn pain medication as she reports she had an issue with opioid addiction and is very reluctant to take any again. I educated Misty Green on pain control and she stated pain was not too bad. Will continue to monitor.

## 2018-05-23 NOTE — Progress Notes (Signed)
Nutrition Follow-up   RD working remotely.  DOCUMENTATION CODES:   Not applicable  INTERVENTION:  Provide Nepro Shake po TID (Thickened to nectar thick consistency), each supplement provides 425 kcal and 19 grams protein.  Encourage adequate PO intake.   NUTRITION DIAGNOSIS:   Inadequate oral intake related to acute illness as evidenced by NPO status; diet advanced; improved  GOAL:   Patient will meet greater than or equal to 90% of their needs; progressing  MONITOR:   PO intake, Supplement acceptance, Labs, Diet advancement, Weight trends, I & O's, Skin  REASON FOR ASSESSMENT:   Consult Enteral/tube feeding initiation and management  ASSESSMENT:    48 yo female admitted post cardiac arrest. PMH includes COPD, CHF, diverticulosis s/p colostomy Pt with Anuric AKI likely ischemic ATN. 4/30 Extubated. Started HD 4/28.   Temporary HD catheter in place. Last dialysis 5/4. Pt is currently on a dysphagia 3 diet with nectar thick consistency. Meal completion has been 25-30%. Pt currently has Ensure ordered with varied intake. RD to order Nepro Shake instead to aid in caloric and protein needs. Pt encouraged to eat her food at meals and to drink her supplements.   Labs and medications reviewed.   Diet Order:   Diet Order            DIET DYS 3 Room service appropriate? Yes; Fluid consistency: Nectar Thick  Diet effective now              EDUCATION NEEDS:   Not appropriate for education at this time  Skin:  Skin Assessment: Reviewed RN Assessment  Last BM:  5/5- 100 ml via colostomy  Height:   Ht Readings from Last 1 Encounters:  05/16/18 5\' 1"  (1.549 m)    Weight:   Wt Readings from Last 1 Encounters:  05/22/18 82 kg    Ideal Body Weight:  47.7 kg  BMI:  Body mass index is 34.16 kg/m.  Estimated Nutritional Needs:   Kcal:  5852-7782  Protein:  90-105 grams  Fluid:  Per MD    Corrin Parker, MS, RD, LDN Pager # [phone] After hours/ weekend  pager # [phone]

## 2018-05-23 NOTE — Progress Notes (Signed)
Inpatient Rehab Admissions:  Inpatient Rehab Consult received.  I met with patient at the bedside for rehabilitation assessment and to discuss goals and expectations of an inpatient rehab admission.  She remains disoriented to place and time.  States she would rather go home and come back for rehab when she feels better.  I contacted her husband, [name], and he would like to consider an inpatient rehab stay and will let me know tomorrow if he is interested.  Will follow up tomorrow.  Note, pt will need authorization from her insurance prior to any rehab admission.   Signed: Shann Medal, PT, DPT Admissions Coordinator [phone] 05/23/18  4:36 PM

## 2018-05-23 NOTE — TOC Initial Note (Signed)
Transition of Care [facility]) - Initial/Assessment Note    Patient Details  Name: Misty Green MRN: [id number] Date of Birth: 12-24-1970  Transition of Care [facility]) CM/SW Contact:    Benard Halsted, LCSW Phone Number: 05/23/2018, 10:16 AM  Clinical Narrative:                 48 yr old WF with Hx of anxiety, depression, COPD, OSA/OHS, tobacco abuse, CHF, chronic back pain from spinal stenoses, diverticulosis s/p colostomy presents s/p Cardiac arrest.    CSW received consult for possible SNF placement at time of discharge if patient unable to admit to CIR. CSW spoke with patient's spouse regarding PT recommendation of SNF placement at time of discharge. Patient's spouse reported that he would like for patient to get continued therapy, but he does not want her to go to SNF and patient is requesting to return home as soon as possible. Patient's spouse expressed being hopeful for patient to feel better soon. CSW also described CIR process in case patient can qualify. Spouse reported that he will consider that as well but would like home health services. He does not have a preference of agencies but has heard of [place]. He states he has a walker at home and does not need any other equipment for patient. She also has a home CPAP machine at night but he states she did not need oxygen at other times. He will provide transportation home at discharge. CSW confirmed address and will follow up for final discharge plans. No further questions reported at this time. CSW to continue to follow.   Expected Discharge Plan: [place] Barriers to Discharge: Continued Medical Work up   Patient Goals and CMS Choice Patient states their goals for this hospitalization and ongoing recovery are:: Return home CMS Medicare.gov Compare Post Acute Care list provided to:: Other (Comment Required)(Spouse) Choice offered to / list presented to : Spouse  Expected Discharge Plan and  Services Expected Discharge Plan: [place] In-house Referral: Clinical Social Work Discharge Planning Services: NA Post Acute Care Choice: Home Health Living arrangements for the past 2 months: Single Family Home                 DME Arranged: N/A DME Agency: NA                  Prior Living Arrangements/Services Living arrangements for the past 2 months: Single Family Home Lives with:: Spouse Patient language and need for interpreter reviewed:: Yes Do you feel safe going back to the place where you live?: Yes      Need for Family Participation in Patient Care: Yes (Comment) Care giver support system in place?: Yes (comment) Current home services: DME Criminal Activity/Legal Involvement Pertinent to Current Situation/Hospitalization: No - Comment as needed  Activities of Daily Living      Permission Sought/Granted Permission sought to share information with : Facility Sport and exercise psychologist, Family Supports Permission granted to share information with : Yes, Verbal Permission Granted  Share Information with NAME: [name]  Permission granted to share info w AGENCY: [place] granted to share info w Relationship: Spouse  Permission granted to share info w Contact Information: [phone]  Emotional Assessment Appearance:: Appears stated age Attitude/Demeanor/Rapport: Unable to Assess(Confused) Affect (typically observed): Unable to Assess Orientation: : Oriented to Self, Oriented to Place Alcohol / Substance Use: Not Applicable Psych Involvement: No (comment)  Admission diagnosis:  Cardiac arrest [facility]) [  I46.9] Lactic acidosis [E87.2] Transaminitis [R74.0] Acute on chronic respiratory failure with hypercapnia ([place]) [J96.22] Patient Active Problem List   Diagnosis Date Noted  . ARF (acute renal failure) ([place])   . Lactic acidosis   . Transaminitis   . Acute on chronic respiratory failure with hypercapnia ([place])   . Cardiac arrest ([place])  05/12/2018  . Diverticulitis of colon (without mention of hemorrhage)(562.11)   . Acute diverticulitis 01/06/2018  . Mixed anxiety and depressive disorder 12/22/2017  . Obesity 12/22/2017  . Tobacco user 12/22/2017  . Diverticulitis 12/03/2017  . Spinal stenosis of lumbar region 04/12/2016  . Acute respiratory failure ([place]) 03/22/2016  . COPD exacerbation ([place]) 03/22/2016  . Chronic back pain 03/22/2016  . Opioid type dependence, abuse ([place]) 03/22/2016  . Polycythemia 03/22/2016  . Anxiety 03/22/2016  . OSA on CPAP 03/22/2016  . Closed fracture of distal end of right radius 08/25/2015  . Closed nondisplaced fracture of styloid process of right ulna 08/25/2015   PCP:  Sinda Du, MD Pharmacy:   [id number] [place], [address] [place] [address] Phone: [phone] Fax: [place], [place], [place] [address], [place] [address] Phone: [phone] Fax: [phone]     Social Determinants of Health (SDOH) Interventions    Readmission Risk Interventions Readmission Risk Prevention Plan 05/23/2018 05/15/2018  Transportation Screening Complete (No Data)  Medication Review Press photographer) Complete -  PCP or Specialist appointment within 3-5 days of discharge Complete -  [place] or Home Care Consult Complete -  SW Recovery Care/Counseling Consult Complete -  [place] Patient Refused -  Some recent data might be hidden

## 2018-05-23 NOTE — Progress Notes (Signed)
Spoke with pt's daughter Aniceto Boss and with pt's husband. Pt also spoke with them. Pt confused, asking her husband to come get her because she is discharged. Reoriented pt and confirmed that no discharge is planned yet today. Husband is going to bring cellphone in to be able to video chat with pt today.  Daughter would like a call from doctors, please: [phone]

## 2018-05-23 NOTE — Progress Notes (Signed)
Pt reported chest pain to nursing. Michela Pitcher it had been going on all day (note that patient had denied pain to anywhere other than back earlier today). Denied shortness of breath, no resp distress noted. No radiation of pain. MD Erlinda Hong notified via [place]. Pt up to chair, on 4L [place], currently NSR on telemetry  BP (!) 152/84   Pulse 78   Temp 98.1 F (36.7 C)   Resp (!) 21   Ht 5\' 1"  (1.549 m)   Wt 82 kg   SpO2 100%   BMI 34.16 kg/m

## 2018-05-23 NOTE — Progress Notes (Signed)
  Speech Language Pathology Treatment: Dysphagia  Patient Details Name: MERNA BALDI MRN: [id number] DOB: [birth year] Today's Date: 05/23/2018 Time: 9753-0051 SLP Time Calculation (min) (ACUTE ONLY): 10 min  Assessment / Plan / Recommendation Clinical Impression  Pt's vocal quality and mentation appear improved from initial swallow evaluation, but she continues have subtle coughing and throat clearing with thin liquids. Pt says that she has been coughing all day, but cough was only observed this session while consuming water. She could not complete the 3 ounce water screen, initially because of weak coughing that followed and again because she could not drink without stopping. Recommend advancing solids to Dys 3 textures but continuing nectar thick liquids pending MBS - to be done on next date at the earliest.    HPI HPI: Pt is a 48 yo female who presents s/p cardiac arrest (downtime unknown). ETT 4/24-4/30. MRI Brain 4/26 negative for acute changes. PMHx COPD, CHF, chronic back pain from spinal stenoses, diverticulosis s/p colostomy, anxiety      SLP Plan  MBS       Recommendations  Diet recommendations: Dysphagia 3 (mechanical soft);Nectar-thick liquid Liquids provided via: Cup;Straw Medication Administration: Whole meds with puree Supervision: Patient able to self feed;Intermittent supervision to cue for compensatory strategies Compensations: Minimize environmental distractions;Slow rate;Small sips/bites Postural Changes and/or Swallow Maneuvers: Seated upright 90 degrees;Upright 30-60 min after meal                Oral Care Recommendations: Oral care BID Follow up Recommendations: Inpatient Rehab SLP Visit Diagnosis: Dysphagia, unspecified (R13.10) Plan: MBS       GO                Venita Sheffield Dollie Mayse 05/23/2018, 3:28 PM  [name], M.A. [place] Acute Environmental education officer [phone] Office [phone]

## 2018-05-23 NOTE — Progress Notes (Signed)
PROGRESS NOTE  Misty Green HQP:[id number] DOB: 1970-02-14 DOA: 05/12/2018 PCP: Sinda Du, MD  HPI/Recap of past 24 hours:  She is sitting up in chair, C/o intermittent chest pain, she is on 4liter oxygen, no sob at rest She is alert and interactive but remain confused, today she knows the year, not oriented to the month , not oriented to place can not remember the year  Getting IHD, bladder scan,  awaiting renal recovery Possible CIR placement  Assessment/Plan: Active Problems:   COPD exacerbation (HCC)   OSA on CPAP   Cardiac arrest ([place])   Lactic acidosis   Transaminitis   Acute on chronic respiratory failure with hypercapnia ([place])   ARF (acute renal failure) ([place])    Cardiac arrest/anoxic brain injury -improving, but remain confused  Acute on chronic hypoxic respiratory failure/copd exacerbation/aspiration pneumonia/H flu Improving, no wheezing, no cough  wean oxygen   AKI likely ischemic ATN: Negative kidney ultrasound on 4/27,    Started on dialysis change 4/28 Awaiting renal recovery , appreciate nephrology input  Anemia: Normocytic hgb 10 (hgb 13 on presentation) No sign of bleeding, possibly due to aki, frequent blood draw  Mild elevated lft Likely due to ischemia secondary to cardiac arrest  Body mass index is 34.16 kg/m.  Code Status: full  Family Communication: patient   Disposition Plan: CIR   Consultants:  Critical care admit  CIR  nephrology  Procedures:  Intubation/extubation  dialysis  Antibiotics:  As above   Objective: BP (!) 152/84   Pulse 78   Temp 98.1 F (36.7 C)   Resp (!) 21   Ht 5' 1"  (1.549 m)   Wt 82 kg   SpO2 100%   BMI 34.16 kg/m   Intake/Output Summary (Last 24 hours) at 05/23/2018 1210 Last data filed at 05/23/2018 5993 Gross per 24 hour  Intake 10 ml  Output 3100 ml  Net -3090 ml   Filed Weights   05/22/18 0500 05/22/18 1330 05/22/18 1700  Weight: 88.2 kg 88.1 kg 82 kg    Exam:  Patient is examined daily including today on 05/23/2018, exams remain the same as of yesterday except that has changed    General:  NAD,  Confused but cooperative  Cardiovascular: RRR  Respiratory: CTABL  Abdomen: Soft/ND/NT, positive BS  Musculoskeletal: No Edema  Neuro: alert, confused  Data Reviewed: Basic Metabolic Panel: Recent Labs  Lab 05/17/18 0538 05/18/18 0314  05/19/18 0226  05/19/18 1215 05/20/18 0923 05/21/18 0430 05/22/18 0337 05/23/18 0437  NA 138 136   < > 138   < > 137 137 139 138 137  K 4.5 5.0   < > 6.0*   < > 5.1 5.3* 4.1 5.1 4.0  CL 95* 97*   < > 95*  --  95* 96* 96* 99 97*  CO2 28 26   < > 24  --  25 25 28 26 25   GLUCOSE 142* 144*   < > 125*  --  166* 118* 75 73 80  BUN 106* 141*   < > 163*  --  97* 120* 45* 65* 37*  CREATININE 5.07* 6.05*   < > 7.02*  --  5.01* 6.25* 3.79* 5.19* 4.16*  CALCIUM 8.3* 8.6*   < > 8.9  --  8.9 8.8* 8.8* 8.2* 9.0  MG 2.3 2.5*  --  2.6*  --   --  2.4 2.1 2.2 2.1  PHOS 3.7 4.7*  --  6.8*  --   --   --   --   --   --    < > =  values in this interval not displayed.   Liver Function Tests: Recent Labs  Lab 05/17/18 0538 05/18/18 0314 05/19/18 0226 05/21/18 0430  AST 178* 89* 64* 55*  ALT 1,461* 1,017* 748* 435*  ALKPHOS 100 88 81 68  BILITOT 0.4 0.5 0.8 0.7  PROT 5.9* 5.7* 5.6* 5.8*  ALBUMIN 2.4* 2.3* 2.5*  2.4* 2.7*   No results for input(s): LIPASE, AMYLASE in the last 168 hours. Recent Labs  Lab 05/17/18 0700 05/20/18 1231  AMMONIA 37* 35   CBC: Recent Labs  Lab 05/19/18 0226 05/19/18 0454 05/20/18 0923 05/21/18 0430 05/22/18 0337 05/23/18 0437  WBC 16.0*  --  15.7* 12.2* 12.7* 11.4*  HGB 10.6* 9.9* 10.8* 10.0* 10.0* 10.3*  HCT 33.0* 29.0* 34.3* 31.7* 31.2* 33.0*  MCV 92.7  --  94.5 94.6 94.8 95.4  PLT 131*  --  162 158 180 214   Cardiac Enzymes:   Recent Labs  Lab 05/19/18 0226  CKTOTAL 276*   BNP (last 3 results) No results for input(s): BNP in the last 8760 hours.  ProBNP (last 3  results) No results for input(s): PROBNP in the last 8760 hours.  CBG: Recent Labs  Lab 05/23/18 0031 05/23/18 0135 05/23/18 0505 05/23/18 0808 05/23/18 1128  GLUCAP 69* 85 80 81 91    Recent Results (from the past 240 hour(s))  Culture, blood (routine x 2)     Status: None (Preliminary result)   Collection Time: 05/21/18 11:25 AM  Result Value Ref Range Status   Specimen Description BLOOD LEFT HAND  Final   Special Requests   Final    BOTTLES DRAWN AEROBIC ONLY Blood Culture adequate volume   Culture   Final    NO GROWTH 2 DAYS Performed at [facility], [place] [address], [place], [place] [address]    Report Status PENDING  Incomplete  Culture, blood (routine x 2)     Status: None (Preliminary result)   Collection Time: 05/21/18 11:36 AM  Result Value Ref Range Status   Specimen Description BLOOD RIGHT HAND  Final   Special Requests   Final    BOTTLES DRAWN AEROBIC ONLY Blood Culture adequate volume   Culture   Final    NO GROWTH 2 DAYS Performed at [facility], [place] [address], [place],  [address]    Report Status PENDING  Incomplete     Studies: No results found.  Scheduled Meds: . ALPRAZolam  1 mg Oral TID  . budesonide (PULMICORT) nebulizer solution  0.25 mg Nebulization BID  . chlorhexidine gluconate (MEDLINE KIT)  15 mL Mouth Rinse BID  . Chlorhexidine Gluconate Cloth  6 each Topical Daily  . Chlorhexidine Gluconate Cloth  6 each Topical Q0600  . escitalopram  10 mg Oral Daily  . feeding supplement (ENSURE ENLIVE)  237 mL Oral BID BM  . heparin  5,000 Units Subcutaneous Q8H  . insulin aspart  0-15 Units Subcutaneous Q4H  . ipratropium-albuterol  3 mL Nebulization Q6H  . mouth rinse  15 mL Mouth Rinse BID  . methylPREDNISolone (SOLU-MEDROL) injection  40 mg Intravenous Daily  . pantoprazole (PROTONIX) IV  40 mg Intravenous q morning - 10a  . sodium polystyrene  30 g Rectal Once    Continuous Infusions:    Time spent: 42mns I have  personally reviewed and interpreted on  05/23/2018 daily labs, tele strips, imagings as discussed above under date review session and assessment and plans.  I reviewed all nursing notes, pharmacy notes,  consultant notes,  vitals, pertinent old records  I have discussed plan of care as described above with RN , patient on 05/23/2018   Florencia Reasons MD, PhD  [facility] Pager [phone]. If 7PM-7AM, please contact night-coverage at [email or web address], password [facility] 05/23/2018, 12:10 PM  LOS: 11 days

## 2018-05-23 NOTE — Evaluation (Signed)
Speech Language Pathology Evaluation Patient Details Name: Misty Green MRN: [id number] DOB: 02/26/1970 Today's Date: 05/23/2018 Time: 1025-8527 SLP Time Calculation (min) (ACUTE ONLY): 14 min  Problem List:  Patient Active Problem List   Diagnosis Date Noted  . ARF (acute renal failure) ([place])   . Lactic acidosis   . Transaminitis   . Acute on chronic respiratory failure with hypercapnia ([place])   . Cardiac arrest ([place]) 05/12/2018  . Diverticulitis of colon (without mention of hemorrhage)(562.11)   . Acute diverticulitis 01/06/2018  . Mixed anxiety and depressive disorder 12/22/2017  . Obesity 12/22/2017  . Tobacco user 12/22/2017  . Diverticulitis 12/03/2017  . Spinal stenosis of lumbar region 04/12/2016  . Acute respiratory failure ([place]) 03/22/2016  . COPD exacerbation ([place]) 03/22/2016  . Chronic back pain 03/22/2016  . Opioid type dependence, abuse ([place]) 03/22/2016  . Polycythemia 03/22/2016  . Anxiety 03/22/2016  . OSA on CPAP 03/22/2016  . Closed fracture of distal end of right radius 08/25/2015  . Closed nondisplaced fracture of styloid process of right ulna 08/25/2015   Past Medical History:  Past Medical History:  Diagnosis Date  . CHF (congestive heart failure) ([place])   . COPD (chronic obstructive pulmonary disease) ([place])   . Current smoker   . Opiate abuse, continuous ([place])    Past Surgical History:  Past Surgical History:  Procedure Laterality Date  . ABDOMINAL HYSTERECTOMY    . BACK SURGERY    . cesction    . COLOSTOMY  01/13/2018   Procedure: COLOSTOMY Creation;  Surgeon: Jules Husbands, MD;  Location: [facility] ORS;  Service: General;;  . INCISIONAL HERNIA REPAIR     lower midline laparotomy incision (hysterectomy), repaired with mesh  . LAPAROSCOPIC CHOLECYSTECTOMY  2012  . LAPAROSCOPIC LYSIS OF ADHESIONS  01/13/2018   Procedure: LAPAROSCOPIC LYSIS OF ADHESIONS;  Surgeon: Jules Husbands, MD;  Location: [facility] ORS;  Service: General;;  . LAPAROSCOPIC  SIGMOID COLECTOMY  01/13/2018   Procedure: LAPAROSCOPIC SIGMOID COLECTOMY;  Surgeon: Jules Husbands, MD;  Location: [facility] ORS;  Service: General;;  . ORIF WRIST FRACTURE Right 08/25/2015   Procedure: OPEN REDUCTION INTERNAL FIXATION (ORIF) WRIST FRACTURE;  Surgeon: Corky Mull, MD;  Location: [facility] ORS;  Service: Orthopedics;  Laterality: Right;   HPI:  Pt is a 48 yo female who presents s/p cardiac arrest (downtime unknown). ETT 4/24-4/30. MRI Brain 4/26 negative for acute changes. PMHx COPD, CHF, chronic back pain from spinal stenoses, diverticulosis s/p colostomy, anxiety   Assessment / Plan / Recommendation Clinical Impression  Pt presents with moderate cognitive impairments, disoriented x3 despite RN providing frequent reorienting throughout the day. She became very fixated on her cardiac arrest, remaining internally distracted throughout the remainder of the session. Expressive language was sprinkled with word-finding errors, which may also be attributable to reduced attention and thought organization. She recalled 1/4 words on delayed recall task, increased to 2/4 when given  multiple choice options. She does not demonstrate intelletual awareness of cognitive or physical impairments, resulting in overall reduced safety awareness. She says that she was taking care of grandchildren PTA. Recommend CIR level therapy to maximize safety and independence.     SLP Assessment  SLP Recommendation/Assessment: Patient needs continued Speech Lanaguage Pathology Services SLP Visit Diagnosis: Cognitive communication deficit (R41.841)    Follow Up Recommendations  Inpatient Rehab    Frequency and Duration min 2x/week  2 weeks      SLP Evaluation Cognition  Overall Cognitive Status: Impaired/Different from baseline Arousal/Alertness: Awake/alert  Orientation Level: Oriented to person;Disoriented to place;Disoriented to time;Disoriented to situation Attention: Sustained Sustained Attention:  Impaired Sustained Attention Impairment: Functional basic;Verbal basic Memory: Impaired Memory Impairment: Storage deficit;Retrieval deficit;Decreased recall of new information Awareness: Impaired Awareness Impairment: Intellectual impairment;Emergent impairment;Anticipatory impairment Safety/Judgment: Impaired       Comprehension  Auditory Comprehension Overall Auditory Comprehension: Impaired Commands: Impaired One Step Basic Commands: 75-100% accurate Interfering Components: Attention;Processing speed;Working memory    Expression Expression Primary Mode of Expression: Verbal Verbal Expression Overall Verbal Expression: Impaired Initiation: No impairment Other Verbal Expression Comments: word finding errors, ? mild language of confusion   Oral / Motor  Motor Speech Overall Motor Speech: Appears within functional limits for tasks assessed   GO                    Misty Green 05/23/2018, 3:48 PM  Misty Green, M.A. [place] Acute Environmental education officer [phone] Office [phone]

## 2018-05-23 NOTE — Progress Notes (Signed)
[facility] NEPHROLOGY PROGRESS NOTE  Assessment/ Plan: Pt is a 48 y.o. yo female status post cardiac arrest of unclear duration, VDRF and anuric AKI.  # Anuric AKI likely ischemic ATN: Kidney ultrasound with no obstruction done on 4/27, sub-nephrotic proteinuria.   -Started dialysis on 4/28.  Last dialysis on 5/4.  Patient looks more alert today and she is being transferred out from ICU.  -Daily labs and a strict ins and out to monitor renal recovery.  Daily assessment for dialysis need.  She has temporary HD catheter.  #Cardiac arrest: Presumed due to respiratory arrest  #Acute on chronic respiratory failure with hypoxia: Currently on nasal cannula.  Repeat Chest x-ray.  #Aspiration pneumonia, COPD exacerbation: Completed antibiotics course.  #Acute encephalopathy: Multifactorial etiology, mental status is improving.  Recommend to minimize sedatives. Subjective: Seen and examined at bedside.  Patient was talking on phone with her husband.  She denied nausea, vomiting, chest pain or shortness of breath.  I have discussed with patient's husband  and updated him. Objective Vital signs in last 24 hours: Vitals:   05/23/18 0500 05/23/18 0759 05/23/18 0820 05/23/18 0822  BP:      Pulse:  82    Resp:  16    Temp: 98 F (36.7 C)     TempSrc: Oral     SpO2:  98% 98% 98%  Weight:      Height:       Weight change: -0.1 kg  Intake/Output Summary (Last 24 hours) at 05/23/2018 1132 Last data filed at 05/23/2018 0752 Gross per 24 hour  Intake 250 ml  Output 3100 ml  Net -2850 ml       Labs: Basic Metabolic Panel: Recent Labs  Lab 05/17/18 0538 05/18/18 0314  05/19/18 0226  05/21/18 0430 05/22/18 0337 05/23/18 0437  NA 138 136   < > 138   < > 139 138 137  K 4.5 5.0   < > 6.0*   < > 4.1 5.1 4.0  CL 95* 97*   < > 95*   < > 96* 99 97*  CO2 28 26   < > 24   < > 28 26 25   GLUCOSE 142* 144*   < > 125*   < > 75 73 80  BUN 106* 141*   < > 163*   < > 45* 65* 37*   CREATININE 5.07* 6.05*   < > 7.02*   < > 3.79* 5.19* 4.16*  CALCIUM 8.3* 8.6*   < > 8.9   < > 8.8* 8.2* 9.0  PHOS 3.7 4.7*  --  6.8*  --   --   --   --    < > = values in this interval not displayed.   Liver Function Tests: Recent Labs  Lab 05/18/18 0314 05/19/18 0226 05/21/18 0430  AST 89* 64* 55*  ALT 1,017* 748* 435*  ALKPHOS 88 81 68  BILITOT 0.5 0.8 0.7  PROT 5.7* 5.6* 5.8*  ALBUMIN 2.3* 2.5*  2.4* 2.7*   No results for input(s): LIPASE, AMYLASE in the last 168 hours. Recent Labs  Lab 05/17/18 0700 05/20/18 1231  AMMONIA 37* 35   CBC: Recent Labs  Lab 05/19/18 0226  05/20/18 0923 05/21/18 0430 05/22/18 0337 05/23/18 0437  WBC 16.0*  --  15.7* 12.2* 12.7* 11.4*  HGB 10.6*   < > 10.8* 10.0* 10.0* 10.3*  HCT 33.0*   < > 34.3* 31.7* 31.2* 33.0*  MCV 92.7  --  94.5  94.6 94.8 95.4  PLT 131*  --  162 158 180 214   < > = values in this interval not displayed.   Cardiac Enzymes: Recent Labs  Lab 05/19/18 0226  CKTOTAL 276*   CBG: Recent Labs  Lab 05/23/18 0031 05/23/18 0135 05/23/18 0505 05/23/18 0808 05/23/18 1128  GLUCAP 69* 85 80 81 91    Iron Studies: No results for input(s): IRON, TIBC, TRANSFERRIN, FERRITIN in the last 72 hours. Studies/Results: Mr Brain Wo Contrast  Result Date: 05/21/2018 CLINICAL DATA:  Concern for anoxic injury. EXAM: MRI HEAD WITHOUT CONTRAST TECHNIQUE: Multiplanar, multiecho pulse sequences of the brain and surrounding structures were obtained without intravenous contrast. COMPARISON:  05/14/2018 FINDINGS: Brain: No infarct is seen. On FLAIR imaging and a few T2 weighted slices there is question of cortical edema, but no clear swelling/mass effect and no change in sulci appearance since admission head CT. Some of this FLAIR signal may be related to motion and oxygenation. No hydrocephalus, mass, collection, or blood products. Vascular: Major flow voids are preserved. Skull and upper cervical spine: Negative for marrow lesion  Sinuses/Orbits: Opacified sphenoid sinuses and right maxillary fluid level. Patchy bilateral mastoid opacification. IMPRESSION: 1. Negative for infarct or definitive global anoxic injury. 2. Per chart a temporal lobe lesion is questioned but again not visualized. Contrast was not administered in the setting of profound acute renal failure. 3. Sinusitis. Electronically Signed   By: Monte Fantasia M.D.   On: 05/21/2018 15:28    Medications: Infusions:   Scheduled Medications: . ALPRAZolam  1 mg Oral TID  . budesonide (PULMICORT) nebulizer solution  0.25 mg Nebulization BID  . chlorhexidine gluconate (MEDLINE KIT)  15 mL Mouth Rinse BID  . Chlorhexidine Gluconate Cloth  6 each Topical Daily  . Chlorhexidine Gluconate Cloth  6 each Topical Q0600  . escitalopram  10 mg Oral Daily  . feeding supplement (ENSURE ENLIVE)  237 mL Oral BID BM  . heparin  5,000 Units Subcutaneous Q8H  . insulin aspart  0-15 Units Subcutaneous Q4H  . ipratropium-albuterol  3 mL Nebulization Q6H  . mouth rinse  15 mL Mouth Rinse BID  . methylPREDNISolone (SOLU-MEDROL) injection  40 mg Intravenous Daily  . pantoprazole (PROTONIX) IV  40 mg Intravenous q morning - 10a  . sodium polystyrene  30 g Rectal Once    have reviewed scheduled and prn medications.  Physical Exam: General: In distress, comfortable Heart:RRR, s1s2 nl, no rubs Lungs: Clear bilateral, no wheezing Abdomen:soft, Non-tender, non-distended Extremities:No edema Dialysis Access: IJ catheter.   Amali Uhls Prasad Darreld Hoffer 05/23/2018,11:32 AM  LOS: 11 days

## 2018-05-24 ENCOUNTER — Inpatient Hospital Stay (HOSPITAL_COMMUNITY): Payer: 59

## 2018-05-24 LAB — BASIC METABOLIC PANEL
Anion gap: 17 — ABNORMAL HIGH (ref 5–15)
BUN: 53 mg/dL — ABNORMAL HIGH (ref 6–20)
CO2: 27 mmol/L (ref 22–32)
Calcium: 8.9 mg/dL (ref 8.9–10.3)
Chloride: 93 mmol/L — ABNORMAL LOW (ref 98–111)
Creatinine, Ser: 5.48 mg/dL — ABNORMAL HIGH (ref 0.44–1.00)
GFR calc Af Amer: 10 mL/min — ABNORMAL LOW (ref >60)
GFR calc non Af Amer: 9 mL/min — ABNORMAL LOW (ref >60)
Glucose, Bld: 93 mg/dL (ref 70–99)
Potassium: 3.9 mmol/L (ref 3.5–5.1)
Sodium: 137 mmol/L (ref 135–145)

## 2018-05-24 LAB — GLUCOSE, CAPILLARY
Glucose-Capillary: 103 mg/dL — ABNORMAL HIGH (ref 70–99)
Glucose-Capillary: 112 mg/dL — ABNORMAL HIGH (ref 70–99)
Glucose-Capillary: 117 mg/dL — ABNORMAL HIGH (ref 70–99)
Glucose-Capillary: 124 mg/dL — ABNORMAL HIGH (ref 70–99)
Glucose-Capillary: 88 mg/dL (ref 70–99)
Glucose-Capillary: 99 mg/dL (ref 70–99)

## 2018-05-24 LAB — CBC
HCT: 30.7 % — ABNORMAL LOW (ref 36.0–46.0)
Hemoglobin: 9.7 g/dL — ABNORMAL LOW (ref 12.0–15.0)
MCH: 29.6 pg (ref 26.0–34.0)
MCHC: 31.6 g/dL (ref 30.0–36.0)
MCV: 93.6 fL (ref 80.0–100.0)
Platelets: 212 10*3/uL (ref 150–400)
RBC: 3.28 MIL/uL — ABNORMAL LOW (ref 3.87–5.11)
RDW: 13.2 % (ref 11.5–15.5)
WBC: 9.7 10*3/uL (ref 4.0–10.5)
nRBC: 0 % (ref 0.0–0.2)

## 2018-05-24 LAB — MAGNESIUM: Magnesium: 2.2 mg/dL (ref 1.7–2.4)

## 2018-05-24 MED ORDER — IPRATROPIUM-ALBUTEROL 0.5-2.5 (3) MG/3ML IN SOLN
3.0000 mL | Freq: Three times a day (TID) | RESPIRATORY_TRACT | Status: DC
  Administered 2018-05-25 – 2018-05-27 (×4): 3 mL via RESPIRATORY_TRACT
  Filled 2018-05-24 (×6): qty 3

## 2018-05-24 MED ORDER — PANTOPRAZOLE SODIUM 40 MG PO TBEC
40.0000 mg | DELAYED_RELEASE_TABLET | Freq: Every day | ORAL | Status: DC
  Administered 2018-05-25 – 2018-05-27 (×3): 40 mg via ORAL
  Filled 2018-05-24 (×3): qty 1

## 2018-05-24 NOTE — Consult Note (Addendum)
   [facility] CM Inpatient Consult   05/24/2018  TIERANY APPLEBY Dec 15, 1970 [id number]    Patient screened to check for potential [place] [facility] services needs by this [employer] 48% extreme high risk for unplanned readmission; has 3 hospitalizations and 1 ED visit in past 6 months.  Per chart review and history and physical on 05/12/18, reveals as follows: Mrs. Tracy Gerken is a 48 y.o. female with COPD- not on home O2, diverticulitis with hx colectomy and colostomy, dCHF, tobacco abuse, CHF, chronic back pain from spinal stenosis and OSA. She presents on 05/13/18 to [facility] s/p cardiac arrest (initial rhythm unknown) presumed PEA arrest from respiratory failure, was intubated in the ED. COVID negative.  (Acute on chronic respiratory failure with hypoxia and hypercapnia, presumed aspiration pneumonia/H. influenzae pneumonia, OSA on CPAP at home, COPD exacerbation)  Chart review reveals that patient is being recommended to [facility] Inpatient rehab [CIR] perPT/ OTrecommendations. Husband does not want patient to go to SNF (skilled nursing facility).  Rehab admissions coordinator note states that patient's husband would like to consider an inpatient rehab stay. [facility].  Patient's primarycareprovider isDr.Edward Hawkins at [employer]. Luan Pulling, MD office, listed as providing transition of care.  Will follow progress and disposition.Ifthere are changes in post hospital needs,please referto [facility] care management forappropriate community follow-up.  Of note, [facility] Care Management services does not replace or interfere with any services that are arranged by transition of care case management or social work.   For questions and additional information, please call:  Elisa Kutner A. Joory Gough, BSN, RN-BC [facility] Liaison Cell: [phone]

## 2018-05-24 NOTE — Progress Notes (Signed)
Modified Barium Swallow Progress Note  Patient Details  Name: Misty Green MRN: [id number] Date of Birth: 1970-02-14  Today's Date: 05/24/2018  Modified Barium Swallow completed.  Full report located under Chart Review in the Imaging Section.  Brief recommendations include the following:  Clinical Impression  Pt has a mild oral dysphagia with prolonged oral transit and transient oral holding, with occasional premature spillage of thin liquids only that results in flash penetration of thin liquids. No penetration remained in the airway even if it reached deeper toward the true vocal folds and she had no aspiration. Of note, she did not have as much coughing during this study as she has been having at bedside - only one soft, mildly congested coughing as the test ended. Recommend Dys 3 diet but with thin liquids. As mentation improves and oral preparation becomes swifter, additional solid advancement can be made clinically.   Swallow Evaluation Recommendations       SLP Diet Recommendations: Dysphagia 3 (Mech soft) solids;Thin liquid       Medication Administration: Whole meds with liquid   Supervision: Patient able to self feed;Intermittent supervision to cue for compensatory strategies   Compensations: Minimize environmental distractions;Slow rate;Small sips/bites   Postural Changes: Seated upright at 90 degrees   Oral Care Recommendations: Oral care BID        Venita Sheffield Majel Giel 05/24/2018,10:15 AM   [name], M.A. [place] Acute Environmental education officer [phone] Office [phone]

## 2018-05-24 NOTE — Progress Notes (Signed)
Pt slouched down in recliner upon arrival. Ready to return to bed, but had not attempted to call for assist, reported she did not want to bother anybody. When questioned, pt not able to recall how to use call button. Pt continues to demonstrate impaired attention, impacting ability to participate in ADL without multiple verbal cues. Min assist to pivot back to bed with hand held assist. Continues to be a good rehab candidate.  05/24/18 1518  OT Visit Information  Last OT Received On 05/24/18  Assistance Needed +2 (gait)  History of Present Illness 48 yr old F w/ PMHx COPD, CHF, chronic back pain from spinal stenoses, diverticulosis s/p colostomy presents s/p cardiac arrest.   Precautions  Precautions Fall  Precaution Comments on 3L 02 at baseline  Pain Assessment  Pain Assessment Faces  Faces Pain Scale 4  Pain Location back, sternum from compressions  Pain Descriptors / Indicators Grimacing;Guarding;Sore  Pain Intervention(s) Repositioned  Cognition  Arousal/Alertness Awake/alert  Behavior During Therapy WFL for tasks assessed/performed  Overall Cognitive Status Impaired/Different from baseline  Area of Impairment Memory;Safety/judgement;Problem solving;Attention  Current Attention Level Sustained  Memory Decreased short-term memory;Decreased recall of precautions  Following Commands Follows one step commands with increased time  Safety/Judgement Decreased awareness of safety;Decreased awareness of deficits  Awareness Intellectual  Problem Solving Slow processing;Decreased initiation;Difficulty sequencing;Requires verbal cues;Requires tactile cues  General Comments pt with difficulty recalling during conversation  ADL  Overall ADL's  Needs assistance/impaired  Grooming Oral care;Sitting;Minimal assistance  Upper Body Dressing  Moderate assistance;Sitting  Upper Body Dressing Details (indicate cue type and reason) to doff front opening gown  Bed Mobility  Overal bed mobility Needs  Assistance  Bed Mobility Sit to Supine  Sit to supine Supervision  General bed mobility comments no physical assist needed  Balance  Overall balance assessment Needs assistance  Sitting balance-Leahy Scale Fair  Standing balance support Single extremity supported  Standing balance-Leahy Scale Poor  Standing balance comment in static standing  Transfers  Overall transfer level Needs assistance  Equipment used 1 person hand held assist  Transfers Sit to/from Stand;Stand Pivot Transfers  Sit to Stand Min assist  Stand pivot transfers Min assist  General transfer comment required 2 attempts to stand, steadying assist upon second attempt   OT - End of Session  Equipment Utilized During Treatment Gait belt;Oxygen  Activity Tolerance Patient tolerated treatment well  Patient left in bed;with call bell/phone within reach;with bed alarm set (mats placed bedside)  OT Assessment/Plan  OT Plan Discharge plan remains appropriate  OT Visit Diagnosis Unsteadiness on feet (R26.81);Other abnormalities of gait and mobility (R26.89);Muscle weakness (generalized) (M62.81);Other symptoms and signs involving cognitive function  OT Frequency (ACUTE ONLY) Min 3X/week  Follow Up Recommendations CIR;Supervision/Assistance - 24 hour  OT Equipment Other (comment) (defer to next venue)  AM-PAC OT "6 Clicks" Daily Activity Outcome Measure (Version 2)  Help from another person eating meals? 3  Help from another person taking care of personal grooming? 3  Help from another person toileting, which includes using toliet, bedpan, or urinal? 2  Help from another person bathing (including washing, rinsing, drying)? 2  Help from another person to put on and taking off regular upper body clothing? 2  Help from another person to put on and taking off regular lower body clothing? 2  6 Click Score 14  OT Goal Progression  Progress towards OT goals Progressing toward goals  Acute Rehab OT Goals  Patient Stated Goal to  go home  OT  Goal Formulation With patient  Time For Goal Achievement 06/02/18  Potential to Achieve Goals Good  OT Time Calculation  OT Start Time (ACUTE ONLY) 1445  OT Stop Time (ACUTE ONLY) 1508  OT Time Calculation (min) 23 min  OT General Charges  $OT Visit 1 Visit  OT Treatments  $Self Care/Home Management  23-37 mins  Nestor Lewandowsky, OTR/L Acute Rehabilitation Services Pager: [phone] Office: [phone]

## 2018-05-24 NOTE — Progress Notes (Signed)
PROGRESS NOTE    Misty Green  GGY:[id number] DOB: 1970-12-10 DOA: 05/12/2018 PCP: Sinda Du, MD      Brief Narrative:  Misty Green is a 48 y.o. F with COPD not on home O2, diverticulitis with hx colectomy and colostomy, dCHF, and OSA who presented with cardiac arrest.     Assessment & Plan:  Acute on chronic respiratory failure with hypoxia and hypercapnia Presumed aspiration pneumonia/H. influenzae pneumonia OSA on CPAP at home, only nocturnal O2 at home COPD exacerbation Intub 4/24 >> 4/28 Blood cultures negative x4.  RVP negative. SARS-CoV-2 testing was obtained for screening purposes.  COVID ruled out.  Tracheal aspirate with scant H flu.  Completed 5 days Cefepime.  -Wean O2 as able -Continue Solu-medrol -Continue scheduled Duoneb -Hold home Spiriva, Advair and Daliresp -Continue pulmonary toilet    Cardiac arrest Initial rhythm unknown, presumed PEA arrest from respiratory failure CPR ~15 minutes before ROSC, per report.  Initial rhythm unknown.  Echocardiogram with preserved EF.  Acute renal failure UOP >500 cc yesterday.  Has RIJ catheter.  Started HD on 4/28. -Strict I/Os -Daily Renal panel -Consult Nephrology, appreciate cares  Acute metabolic encephalopathy MRI brain not c/w anoxic injury.   Septic shock vs cardiogenic shock Blood Transaminitis Likely shock liver, resolved.  History of diverticulitis s/p Hartmann's with colostomy   Anxiety -Continue Lexapro -Reduce alprazolam to taper off  Anemia of chronic disease No clinical bleeding, Hgb stable         MDM and disposition: The below labs and imaging reports were reviewed and summarized above.  Medication management as above.  The patient was admitted with cardiac arrest.  She has persistent acute metabolic encephalopathy, persistent hypoxic respiratory failure and persistent renal failure requiring dialysis.  These are severe exacerbations of her chronic diseases  (COPD, OSA) as well as new acute illnesses (renal failure, cardiac arrest) that pose an ongoing threat to life and bodily function.        DVT prophylaxis: Heparin Code Status: FULL Family Communication: None    Consultants:   PCCM  Nephrology  Procedures:   Intubation 4/24  Central line placement 4/24  Extubation 4/28   Antimicrobials:   Cefepime    Subjective: Feeling tired.  Cough noted.  No fever.  Still confused.  No vomiting, diarrhea. No chest pain, abdominal pain.  Objective: Vitals:   05/24/18 0429 05/24/18 0431 05/24/18 0940 05/24/18 1344  BP: (!) 152/84  (!) 162/86   Pulse: 73  73   Resp: 15  11   Temp: 97.8 F (36.6 C)  98 F (36.7 C)   TempSrc: Axillary  Oral   SpO2: 100%  98% 96%  Weight:  86.6 kg    Height:        Intake/Output Summary (Last 24 hours) at 05/24/2018 1506 Last data filed at 05/24/2018 0943 Gross per 24 hour  Intake 0 ml  Output 600 ml  Net -600 ml   Filed Weights   05/22/18 1330 05/22/18 1700 05/24/18 0431  Weight: 88.1 kg 82 kg 86.6 kg    Examination: General appearance: obese adult female, awake, appears tired, no acute distress.   HEENT: Anicteric, conjunctiva pink, lids and lashes normal. No nasal deformity, discharge, epistaxis.  Lips moist, dentition poor, OP moist, no oral lesions, hearing normal.   Skin: Warm and dry.  No jaundice.  No suspicious rashes or lesions. Cardiac: RRR, nl S1-S2, no murmurs appreciated.  Capillary refill is brisk.  JVP not visible.  No LE edema.  Radial pulses 2+ and symmetric. Respiratory: Increased respiratory effort, lung sounds diminished bilaterally Abdomen: Abdomen soft.  No TTP. No ascites, distension, hepatosplenomegaly.   MSK: No deformities or effusions. Neuro: Awake and alert.  EOMI, moves all extremities. Speech fluent.    Psych: Sensorium intact and responding to questions, attention diminished. Affect blunted.  Judgment and insight appear moderately impaired.    Data  Reviewed: I have personally reviewed following labs and imaging studies:  CBC: Recent Labs  Lab 05/20/18 0923 05/21/18 0430 05/22/18 0337 05/23/18 0437 05/24/18 0444  WBC 15.7* 12.2* 12.7* 11.4* 9.7  HGB 10.8* 10.0* 10.0* 10.3* 9.7*  HCT 34.3* 31.7* 31.2* 33.0* 30.7*  MCV 94.5 94.6 94.8 95.4 93.6  PLT 162 158 180 214 962   Basic Metabolic Panel: Recent Labs  Lab 05/18/18 0314  05/19/18 0226  05/20/18 0923 05/21/18 0430 05/22/18 0337 05/23/18 0437 05/24/18 0444  NA 136   < > 138   < > 137 139 138 137 137  K 5.0   < > 6.0*   < > 5.3* 4.1 5.1 4.0 3.9  CL 97*   < > 95*   < > 96* 96* 99 97* 93*  CO2 26   < > 24   < > 25 28 26 25 27   GLUCOSE 144*   < > 125*   < > 118* 75 73 80 93  BUN 141*   < > 163*   < > 120* 45* 65* 37* 53*  CREATININE 6.05*   < > 7.02*   < > 6.25* 3.79* 5.19* 4.16* 5.48*  CALCIUM 8.6*   < > 8.9   < > 8.8* 8.8* 8.2* 9.0 8.9  MG 2.5*  --  2.6*  --  2.4 2.1 2.2 2.1 2.2  PHOS 4.7*  --  6.8*  --   --   --   --   --   --    < > = values in this interval not displayed.   GFR: Estimated Creatinine Clearance: 12.5 mL/min (A) (by C-G formula based on SCr of 5.48 mg/dL (H)). Liver Function Tests: Recent Labs  Lab 05/18/18 0314 05/19/18 0226 05/21/18 0430  AST 89* 64* 55*  ALT 1,017* 748* 435*  ALKPHOS 88 81 68  BILITOT 0.5 0.8 0.7  PROT 5.7* 5.6* 5.8*  ALBUMIN 2.3* 2.5*  2.4* 2.7*   No results for input(s): LIPASE, AMYLASE in the last 168 hours. Recent Labs  Lab 05/20/18 1231  AMMONIA 35   Coagulation Profile: No results for input(s): INR, PROTIME in the last 168 hours. Cardiac Enzymes: Recent Labs  Lab 05/19/18 0226  CKTOTAL 276*   BNP (last 3 results) No results for input(s): PROBNP in the last 8760 hours. HbA1C: No results for input(s): HGBA1C in the last 72 hours. CBG: Recent Labs  Lab 05/23/18 2017 05/24/18 0003 05/24/18 0426 05/24/18 0758 05/24/18 1211  GLUCAP 83 117* 99 88 112*   Lipid Profile: No results for input(s):  CHOL, HDL, LDLCALC, TRIG, CHOLHDL, LDLDIRECT in the last 72 hours. Thyroid Function Tests: No results for input(s): TSH, T4TOTAL, FREET4, T3FREE, THYROIDAB in the last 72 hours. Anemia Panel: No results for input(s): VITAMINB12, FOLATE, FERRITIN, TIBC, IRON, RETICCTPCT in the last 72 hours. Urine analysis:    Component Value Date/Time   COLORURINE YELLOW 05/21/2018 0901   APPEARANCEUR HAZY (A) 05/21/2018 0901   LABSPEC 1.009 05/21/2018 0901   PHURINE 6.0 05/21/2018 0901   GLUCOSEU NEGATIVE 05/21/2018 0901   HGBUR SMALL (A) 05/21/2018  [place] 05/21/2018 0901   KETONESUR NEGATIVE 05/21/2018 0901   PROTEINUR 100 (A) 05/21/2018 0901   NITRITE NEGATIVE 05/21/2018 0901   LEUKOCYTESUR NEGATIVE 05/21/2018 0901   Sepsis Labs: @LABRCNTIP (procalcitonin:4,lacticacidven:4)  ) Recent Results (from the past 240 hour(s))  Culture, blood (routine x 2)     Status: None (Preliminary result)   Collection Time: 05/21/18 11:25 AM  Result Value Ref Range Status   Specimen Description BLOOD LEFT HAND  Final   Special Requests   Final    BOTTLES DRAWN AEROBIC ONLY Blood Culture adequate volume   Culture   Final    NO GROWTH 3 DAYS Performed at [facility], [place] [address] [place], [place] [address]    Report Status PENDING  Incomplete  Culture, blood (routine x 2)     Status: None (Preliminary result)   Collection Time: 05/21/18 11:36 AM  Result Value Ref Range Status   Specimen Description BLOOD RIGHT HAND  Final   Special Requests   Final    BOTTLES DRAWN AEROBIC ONLY Blood Culture adequate volume   Culture   Final    NO GROWTH 3 DAYS Performed at [facility], [place] [address], [place], [place] [address]    Report Status PENDING  Incomplete         Radiology Studies: Dg Chest Port 1 View  Result Date: 05/24/2018 CLINICAL DATA:  Hypoxia   Patient a little AMS EXAM: PORTABLE CHEST - 1 VIEW COMPARISON:  05/18/2018 FINDINGS: Patient has been extubated, and  the gastric tube removed. Right IJ dialysis catheter stable. Left infrahilar interstitial opacity stable. Improvement in mild edema or infiltrates. Stable calcified upper lobe granulomas Heart size upper limits normal for technique. No effusion. No pneumothorax. Visualized bones unremarkable. IMPRESSION: Extubation with improvement in bilateral edema/infiltrates. Electronically Signed   By: Lucrezia Europe M.D.   On: 05/24/2018 08:37        Scheduled Meds: . ALPRAZolam  1 mg Oral TID  . budesonide (PULMICORT) nebulizer solution  0.25 mg Nebulization BID  . chlorhexidine gluconate (MEDLINE KIT)  15 mL Mouth Rinse BID  . Chlorhexidine Gluconate Cloth  6 each Topical Daily  . Chlorhexidine Gluconate Cloth  6 each Topical Q0600  . escitalopram  10 mg Oral Daily  . feeding supplement (NEPRO CARB STEADY)  237 mL Oral TID BM  . heparin  5,000 Units Subcutaneous Q8H  . insulin aspart  0-15 Units Subcutaneous Q4H  . ipratropium-albuterol  3 mL Nebulization Q6H  . mouth rinse  15 mL Mouth Rinse BID  . methylPREDNISolone (SOLU-MEDROL) injection  40 mg Intravenous Daily  . [START ON 05/25/2018] pantoprazole  40 mg Oral Daily  . sodium polystyrene  30 g Rectal Once   Continuous Infusions:   LOS: 12 days    Time spent: 35 minutes    Edwin Dada, MD [facility] 05/24/2018, 3:06 PM     Please page through AMION:  [email or web address] If 7PM-7AM, please contact night-coverage

## 2018-05-24 NOTE — Research (Signed)
VITAK- 19 Antibody Fast Detection Kit Study  Pre-consent   Subject Name: Misty Green. Misty Green  This Patient Misty Green, is a current patient who potentially meets eligibility criteria for the above referenced study. Today study coordinator Early Chars approached this patient today to discuss the trial and ask if she would be interested in participation. The informed consent and study related procedures have been explained to the subject by [facility]. The subject expressed comprehension of the requirements of the study and subject involved activities. The patient was given ample time to review the consent form. The subject was informed that this study is for the purpose of research and that participation was completely voluntary. All questions were answered to the satisfaction of the subject. The subject was agreeable to participation however at the time the subject although eager to participate was visibly fatigued and had tremor in her hand and just could not physically sign and date the consent form. The study coordinator decided that it would be best to delay carrying out any study activities until the patient has had some time to rest. The patient was informed that the coordinator would come back the next morning and the patient was agreeable. The coordinator thanked her for her time and willingness to attempt to proceed with the study the next day.  Misty Green [id number] 10:44 PM

## 2018-05-24 NOTE — Progress Notes (Signed)
Inpatient Rehab Admissions Coordinator:   Attempted to meet with pt per husband's request.  Pt with physician at time of visit.  Will f/u tomorrow.   Shann Medal, PT, DPT Admissions Coordinator [phone] 05/24/18  4:43 PM

## 2018-05-24 NOTE — Progress Notes (Signed)
[facility] NEPHROLOGY PROGRESS NOTE  Assessment/ Plan: Pt is a 48 y.o. yo female status post cardiac arrest of unclear duration, VDRF and anuric AKI.  # Anuric AKI likely ischemic ATN: Kidney ultrasound with no obstruction done on 4/27, sub-nephrotic proteinuria.   -Started dialysis on 4/28.  Last dialysis on 5/4.  -Patient looks more alert today.  Repeat chest x-ray with improvement in pulmonary edema.  Urine output around 550 cc however serum creatinine level elevated to 5.48 today.  Electrolytes are acceptable.  I am holding dialysis today and repeat labs in the morning.  If no improvement in kidney function tomorrow then we will pursue dialysis.  Continue to monitor urine output and watch for renal recovery. - She has temporary HD catheter.  #Cardiac arrest: Presumed due to respiratory arrest  #Acute on chronic respiratory failure with hypoxia: Currently on nasal cannula.  Repeat Chest x-ray reviewed.  #Aspiration pneumonia, COPD exacerbation: Completed antibiotics course.  #Acute encephalopathy: Multifactorial etiology, mental status is improving.  Recommend to minimize sedatives.  Now on dysphagia diet.  Subjective: Seen and examined at bedside.  She has some cough.  Denied headache, dizziness, nausea vomiting or chest pain. Objective Vital signs in last 24 hours: Vitals:   05/24/18 0228 05/24/18 0429 05/24/18 0431 05/24/18 0940  BP: (!) 143/72 (!) 152/84  (!) 162/86  Pulse: 88 73  73  Resp: 15 15  11   Temp:  97.8 F (36.6 C)  98 F (36.7 C)  TempSrc:  Axillary  Oral  SpO2: 93% 100%  98%  Weight:   86.6 kg   Height:       Weight change: -1.463 kg  Intake/Output Summary (Last 24 hours) at 05/24/2018 1132 Last data filed at 05/24/2018 0943 Gross per 24 hour  Intake 0 ml  Output 600 ml  Net -600 ml       Labs: Basic Metabolic Panel: Recent Labs  Lab 05/18/18 0314  05/19/18 0226  05/22/18 0337 05/23/18 0437 05/24/18 0444  NA 136   < > 138   < >  138 137 137  K 5.0   < > 6.0*   < > 5.1 4.0 3.9  CL 97*   < > 95*   < > 99 97* 93*  CO2 26   < > 24   < > 26 25 27   GLUCOSE 144*   < > 125*   < > 73 80 93  BUN 141*   < > 163*   < > 65* 37* 53*  CREATININE 6.05*   < > 7.02*   < > 5.19* 4.16* 5.48*  CALCIUM 8.6*   < > 8.9   < > 8.2* 9.0 8.9  PHOS 4.7*  --  6.8*  --   --   --   --    < > = values in this interval not displayed.   Liver Function Tests: Recent Labs  Lab 05/18/18 0314 05/19/18 0226 05/21/18 0430  AST 89* 64* 55*  ALT 1,017* 748* 435*  ALKPHOS 88 81 68  BILITOT 0.5 0.8 0.7  PROT 5.7* 5.6* 5.8*  ALBUMIN 2.3* 2.5*  2.4* 2.7*   No results for input(s): LIPASE, AMYLASE in the last 168 hours. Recent Labs  Lab 05/20/18 1231  AMMONIA 35   CBC: Recent Labs  Lab 05/20/18 0923 05/21/18 0430 05/22/18 0337 05/23/18 0437 05/24/18 0444  WBC 15.7* 12.2* 12.7* 11.4* 9.7  HGB 10.8* 10.0* 10.0* 10.3* 9.7*  HCT 34.3* 31.7* 31.2* 33.0* 30.7*  MCV 94.5 94.6 94.8 95.4 93.6  PLT 162 158 180 214 212   Cardiac Enzymes: Recent Labs  Lab 05/19/18 0226  CKTOTAL 276*   CBG: Recent Labs  Lab 05/23/18 1621 05/23/18 2017 05/24/18 0003 05/24/18 0426 05/24/18 0758  GLUCAP 152* 83 117* 99 88    Iron Studies: No results for input(s): IRON, TIBC, TRANSFERRIN, FERRITIN in the last 72 hours. Studies/Results: Dg Chest Port 1 View  Result Date: 05/24/2018 CLINICAL DATA:  Hypoxia   Patient a little AMS EXAM: PORTABLE CHEST - 1 VIEW COMPARISON:  05/18/2018 FINDINGS: Patient has been extubated, and the gastric tube removed. Right IJ dialysis catheter stable. Left infrahilar interstitial opacity stable. Improvement in mild edema or infiltrates. Stable calcified upper lobe granulomas Heart size upper limits normal for technique. No effusion. No pneumothorax. Visualized bones unremarkable. IMPRESSION: Extubation with improvement in bilateral edema/infiltrates. Electronically Signed   By: Lucrezia Europe M.D.   On: 05/24/2018 08:37     Medications: Infusions:   Scheduled Medications: . ALPRAZolam  1 mg Oral TID  . budesonide (PULMICORT) nebulizer solution  0.25 mg Nebulization BID  . chlorhexidine gluconate (MEDLINE KIT)  15 mL Mouth Rinse BID  . Chlorhexidine Gluconate Cloth  6 each Topical Daily  . Chlorhexidine Gluconate Cloth  6 each Topical Q0600  . escitalopram  10 mg Oral Daily  . feeding supplement (NEPRO CARB STEADY)  237 mL Oral TID BM  . heparin  5,000 Units Subcutaneous Q8H  . insulin aspart  0-15 Units Subcutaneous Q4H  . ipratropium-albuterol  3 mL Nebulization Q6H  . mouth rinse  15 mL Mouth Rinse BID  . methylPREDNISolone (SOLU-MEDROL) injection  40 mg Intravenous Daily  . [START ON 05/25/2018] pantoprazole  40 mg Oral Daily  . sodium polystyrene  30 g Rectal Once    have reviewed scheduled and prn medications.  Physical Exam: General: In distress, comfortable, able to lie flat. Heart:RRR, s1s2 nl, no rubs Lungs: Bibasilar inspiratory crackle, no wheezing Abdomen:soft, Non-tender, non-distended Extremities:No edema Dialysis Access: IJ catheter.    Prasad  05/24/2018,11:32 AM  LOS: 12 days

## 2018-05-24 NOTE — Progress Notes (Signed)
Physical Therapy Treatment Patient Details Name: Misty Green MRN: [id number] DOB: 10-Nov-1970 Today's Date: 05/24/2018    History of Present Illness 48 yr old F w/ PMHx COPD, CHF, chronic back pain from spinal stenoses, diverticulosis s/p colostomy presents s/p cardiac arrest.     PT Comments    Pt in bed on 4 lts at 96%.  AxO x 3.  Assisted OOb to amb.  General bed mobility comments: assist for upper body due to sternum pain.  General transfer comment: + 2 side by side assist as Pt pulling up on EVA walker vs pushing from bed due to sternum pain. General Gait Details: Pt was able to walk with [name] walker with cues and assist for stability as pt was scissoring at times. Cues for safety.  unsteady at times.  Remained on 4 lts to achieve sats > 90% Pt is progressing with her mobility.    Follow Up Recommendations  CIR;Supervision/Assistance - 24 hour     Equipment Recommendations       Recommendations for Other Services       Precautions / Restrictions Precautions Precautions: Fall Precaution Comments: currently on 4 lts nasal Restrictions Weight Bearing Restrictions: No    Mobility  Bed Mobility Overal bed mobility: Needs Assistance Bed Mobility: Supine to Sit     Supine to sit: Min assist     General bed mobility comments: assist for upper body due to sternum pain  Transfers Overall transfer level: Needs assistance Equipment used: Bilateral platform walker(EVA walker) Transfers: Sit to/from Stand Sit to Stand: Min assist;+2 physical assistance;Mod assist         General transfer comment: + 2 side by side assist as Pt pulling up on EVA walker vs pushing from bed due to sternum pain.  Ambulation/Gait Ambulation/Gait assistance: Min assist;Mod assist;+2 physical assistance Gait Distance (Feet): 155 Feet Assistive device: Bilateral platform walker(EVA walker ) Gait Pattern/deviations: Scissoring;Staggering left;Staggering right;Narrow base of support;Trunk  flexed;Leaning posteriorly;Step-through pattern;Decreased stride length Gait velocity: decreased    General Gait Details: Pt was able to walk with [name] walker with cues and assist for stability as pt was scissoring at times. Cues for safety.  unsteady at times.  Remained on 4 lts to achieve sats > 90%   Stairs             Wheelchair Mobility    Modified Rankin (Stroke Patients Only)       Balance                                            Cognition Arousal/Alertness: Awake/alert Behavior During Therapy: WFL for tasks assessed/performed Overall Cognitive Status: Within Functional Limits for tasks assessed                                 General Comments: more alert and following directions       Exercises      General Comments        Pertinent Vitals/Pain Pain Assessment: Faces Faces Pain Scale: Hurts even more Pain Location: sternum (chest compressions/cardiac arrest) Pain Descriptors / Indicators: Sore Pain Intervention(s): Monitored during session    Home Living                      Prior Function  PT Goals (current goals can now be found in the care plan section) Progress towards PT goals: Progressing toward goals    Frequency           PT Plan Current plan remains appropriate    Co-evaluation              AM-PAC PT "6 Clicks" Mobility   Outcome Measure  Help needed turning from your back to your side while in a flat bed without using bedrails?: A Little Help needed moving from lying on your back to sitting on the side of a flat bed without using bedrails?: A Little Help needed moving to and from a bed to a chair (including a wheelchair)?: A Lot Help needed standing up from a chair using your arms (e.g., wheelchair or bedside chair)?: A Lot Help needed to walk in hospital room?: A Lot Help needed climbing 3-5 steps with a railing? : Total 6 Click Score: 13    End of Session  Equipment Utilized During Treatment: Gait belt;Oxygen Activity Tolerance: Patient limited by fatigue Patient left: with call bell/phone within reach;with bed alarm set;in chair Nurse Communication: Mobility status PT Visit Diagnosis: Unsteadiness on feet (R26.81);Muscle weakness (generalized) (M62.81)     Time: 1005-1030 PT Time Calculation (min) (ACUTE ONLY): 25 min  Charges:  $Gait Training: 8-22 mins $Therapeutic Activity: 8-22 mins                     Rica Koyanagi  PTA Acute  Rehabilitation Services Pager      [phone] Office      [phone]

## 2018-05-25 LAB — CBC
HCT: 31.7 % — ABNORMAL LOW (ref 36.0–46.0)
Hemoglobin: 9.8 g/dL — ABNORMAL LOW (ref 12.0–15.0)
MCH: 29.3 pg (ref 26.0–34.0)
MCHC: 30.9 g/dL (ref 30.0–36.0)
MCV: 94.9 fL (ref 80.0–100.0)
Platelets: 244 10*3/uL (ref 150–400)
RBC: 3.34 MIL/uL — ABNORMAL LOW (ref 3.87–5.11)
RDW: 13.4 % (ref 11.5–15.5)
WBC: 10.4 10*3/uL (ref 4.0–10.5)
nRBC: 0 % (ref 0.0–0.2)

## 2018-05-25 LAB — BASIC METABOLIC PANEL
Anion gap: 13 (ref 5–15)
BUN: 64 mg/dL — ABNORMAL HIGH (ref 6–20)
CO2: 27 mmol/L (ref 22–32)
Calcium: 8.9 mg/dL (ref 8.9–10.3)
Chloride: 98 mmol/L (ref 98–111)
Creatinine, Ser: 6.52 mg/dL — ABNORMAL HIGH (ref 0.44–1.00)
GFR calc Af Amer: 8 mL/min — ABNORMAL LOW (ref >60)
GFR calc non Af Amer: 7 mL/min — ABNORMAL LOW (ref >60)
Glucose, Bld: 79 mg/dL (ref 70–99)
Potassium: 4.1 mmol/L (ref 3.5–5.1)
Sodium: 138 mmol/L (ref 135–145)

## 2018-05-25 LAB — GLUCOSE, CAPILLARY
Glucose-Capillary: 101 mg/dL — ABNORMAL HIGH (ref 70–99)
Glucose-Capillary: 155 mg/dL — ABNORMAL HIGH (ref 70–99)
Glucose-Capillary: 81 mg/dL (ref 70–99)
Glucose-Capillary: 86 mg/dL (ref 70–99)
Glucose-Capillary: 89 mg/dL (ref 70–99)

## 2018-05-25 LAB — MAGNESIUM: Magnesium: 2.3 mg/dL (ref 1.7–2.4)

## 2018-05-25 MED ORDER — CEFAZOLIN SODIUM-DEXTROSE 2-4 GM/100ML-% IV SOLN
2.0000 g | INTRAVENOUS | Status: AC
  Administered 2018-05-26: 2 g via INTRAVENOUS
  Filled 2018-05-25: qty 100

## 2018-05-25 MED ORDER — ACETAMINOPHEN 325 MG PO TABS
650.0000 mg | ORAL_TABLET | Freq: Four times a day (QID) | ORAL | Status: DC | PRN
  Administered 2018-05-25 – 2018-05-26 (×4): 650 mg via ORAL
  Filled 2018-05-25 (×4): qty 2

## 2018-05-25 MED ORDER — SODIUM CHLORIDE 0.9 % IV SOLN: 100.0000 mL | INTRAVENOUS | Status: DC | PRN

## 2018-05-25 MED ORDER — ALPRAZOLAM 0.25 MG PO TABS
0.2500 mg | ORAL_TABLET | Freq: Three times a day (TID) | ORAL | Status: DC
  Administered 2018-05-25 – 2018-05-27 (×5): 0.25 mg via ORAL
  Filled 2018-05-25 (×5): qty 1

## 2018-05-25 MED ORDER — HEPARIN SODIUM (PORCINE) 1000 UNIT/ML DIALYSIS
1000.0000 [IU] | Freq: Once | INTRAMUSCULAR | Status: DC
  Filled 2018-05-25: qty 1

## 2018-05-25 MED ORDER — FUROSEMIDE 10 MG/ML IJ SOLN
80.0000 mg | Freq: Once | INTRAMUSCULAR | Status: AC
  Administered 2018-05-25: 80 mg via INTRAVENOUS
  Filled 2018-05-25 (×2): qty 8

## 2018-05-25 MED ORDER — PENTAFLUOROPROP-TETRAFLUOROETH EX AERO: 1.0000 "application " | INHALATION_SPRAY | CUTANEOUS | Status: DC | PRN

## 2018-05-25 MED ORDER — ALTEPLASE 2 MG IJ SOLR
INTRAMUSCULAR | Status: AC
  Administered 2018-05-25: 2 mg
  Filled 2018-05-25: qty 2

## 2018-05-25 MED ORDER — HEPARIN SODIUM (PORCINE) 5000 UNIT/ML IJ SOLN
5000.0000 [IU] | Freq: Three times a day (TID) | INTRAMUSCULAR | Status: DC
  Administered 2018-05-27 (×2): 5000 [IU] via SUBCUTANEOUS
  Filled 2018-05-25 (×2): qty 1

## 2018-05-25 MED ORDER — CHLORHEXIDINE GLUCONATE CLOTH 2 % EX PADS
6.0000 | MEDICATED_PAD | Freq: Every day | CUTANEOUS | Status: DC
  Administered 2018-05-27: 6 via TOPICAL

## 2018-05-25 MED ORDER — LIDOCAINE HCL (PF) 1 % IJ SOLN
5.0000 mL | INTRAMUSCULAR | Status: DC | PRN
  Filled 2018-05-25: qty 5

## 2018-05-25 MED ORDER — HEPARIN SODIUM (PORCINE) 1000 UNIT/ML DIALYSIS
1000.0000 [IU] | INTRAMUSCULAR | Status: DC | PRN
  Filled 2018-05-25: qty 1

## 2018-05-25 MED ORDER — CHLORHEXIDINE GLUCONATE CLOTH 2 % EX PADS: 6.0000 | MEDICATED_PAD | Freq: Every day | CUTANEOUS | Status: DC

## 2018-05-25 MED ORDER — ALTEPLASE 2 MG IJ SOLR
2.0000 mg | Freq: Once | INTRAMUSCULAR | Status: AC | PRN
  Administered 2018-05-25 (×2): 2 mg
  Filled 2018-05-25: qty 2

## 2018-05-25 MED ORDER — LIDOCAINE-PRILOCAINE 2.5-2.5 % EX CREA
1.0000 "application " | TOPICAL_CREAM | CUTANEOUS | Status: DC | PRN
  Filled 2018-05-25: qty 5

## 2018-05-25 NOTE — Progress Notes (Signed)
Renal Navigator received call back from patient's husband, Bill. He was pleasant and appreciative of the call. He states understanding of need for patient to be set up with OP HD seat and had good questions. Renal Navigator asked him to write down his questions in order to ask MD next time he speaks with medical team. He asked how he would know if his wife will continue to need HD and how he would know if she is ok on the days she does not dialyze. Renal Navigator informed him that patient's kidney function will continue to be evaluated on an outpatient basis and that she will continue to be followed by a Nephrologist in the OP HD clinic, which is fully capable of caring for a patient with a diagnosis of AKI, like his wife. Renal Navigator explained that dialysis patient's treat 3 times per week, in most cases, in an OP setting and that knowing distress signs to watch for is a great question to ask the medical team before discharge. Renal Navigator explained that if his wife was in distress, he would not take her to her OP HD clinic, he would take her to the hospital or call 911. He stated understanding.  Patient's husband wants OP HD referral to be initiate at [facility]. Renal Navigator has submitted referral and will follow closely for acceptance and schedule.  Alphonzo Cruise Renal Navigator [phone]

## 2018-05-25 NOTE — Progress Notes (Signed)
RT placed patient on her dream station bipap in dialysis per patient's request. Patient appears comfortable at this time.

## 2018-05-25 NOTE — Progress Notes (Signed)
DBIV consult: Labs to be drawn in dialysis, per order.

## 2018-05-25 NOTE — Progress Notes (Signed)
Renal Navigator received call from Dr. Bhandari/Nephrologist to initiate OP HD seat for AKI. Renal Navigator notes most communication by medical team has been with patient's husband, and patient unavailable due to being in HD at this time. Renal Navigator left message for patient's husband requesting a call back.  Alphonzo Cruise Renal Navigator [phone]

## 2018-05-25 NOTE — Progress Notes (Signed)
Treatment ended. Goal not met due to venous chamber clot X's 2. Alerted MD Carolin Sicks. Placed Cathflo in venous to dwell. Patient returned to room alert oriented vitals stable.

## 2018-05-25 NOTE — Progress Notes (Addendum)
Inpatient Rehab Admissions Coordinator:   I met with patient at the bedside.  She seems more alert today.  She is agreeable to CIR stay (pending insurance approval and bed availability) once medically ready.  Note pt for HD today and IR tunnel catheter placement tomorrow.  Will plan to f/u tomorrow.    Shann Medal, PT, DPT Admissions Coordinator [phone] 05/25/18  11:44 AM

## 2018-05-25 NOTE — Progress Notes (Signed)
Update: Unable to complete dialysis. Tried TPA but still with high venous pressure.  Poor flow.  I will order a dose of IV lasix.  Plan for [facility] tomorrow. I will order HD for tomorrow.

## 2018-05-25 NOTE — Progress Notes (Signed)
PROGRESS NOTE    [name]  IWL:[id number] DOB: [birth year] DOA: 05/12/2018 PCP: Sinda Du, MD      Brief Narrative:  Misty Green is a 48 y.o. F with COPD not on home O2, diverticulitis with hx colectomy and colostomy, dCHF, and OSA who presented with cardiac arrest.     Assessment & Plan:  Acute on chronic respiratory failure with hypoxia and hypercapnia Presumed aspiration pneumonia/H. influenzae pneumonia OSA on CPAP at home, only nocturnal O2 at home COPD exacerbation Intub 4/24 >> 4/28 Blood cultures negative x4.  RVP negative. SARS-CoV-2 testing was obtained for screening purposes.  COVID ruled out.  Tracheal aspirate with scant H flu.  Completed 5 days Cefepime.  -Wean O2 as able -Transition Solu-medrol to prednisone, start to taper -Continue scheduled Duoneb -Hold home Spiriva, Advair and Daliresp -Continue pulmonary toilet    Cardiac arrest Initial rhythm unknown, presumed PEA arrest from respiratory failure CPR ~15 minutes before ROSC, per report.  Initial rhythm unknown.  Echocardiogram with preserved EF.  Acute renal failure UOP >500 cc yesterday.  Has RIJ catheter.  Started HD on 4/28.  Getting dialysis today.  In process of CLIP for AKI. -Strict I/Os -Daily Renal panel -Consult Nephrology, appreciate cares  Acute metabolic encephalopathy MRI brain not c/w anoxic injury.  Septic shock vs cardiogenic shock Blood cultures negative.  Treated with 5 days cefepime for pneumonia.  Now BP elevated.  Hypertension If persistently elevated, will start amlodipine 5 mg tomorrow  Transaminitis Likely shock liver, resolved.  History of diverticulitis s/p Hartmann's with colostomy   Anxiety -Continue Lexapro -Reduce alprazolam to taper off  Anemia of chronic disease No clinical bleeding, Hgb stable         MDM and disposition: The below labs and imaging reports reviewed and summarized above.  Medication management as above.  The  patient was admitted with cardiac arrest.  She has persistent acute metabolic encephalopathy, persistent hypoxic respiratory failure and persistent renal failure requiring dialysis.         DVT prophylaxis: Heparin Code Status: FULL Family Communication: None    Consultants:   PCCM  Nephrology  Procedures:   Intubation 4/24  Central line placement 4/24  Extubation 4/28   Antimicrobials:   Cefepime    Subjective: Patient seen on dialysis.  Very sleepy.  No new complaints, but limited engagement.  Per nursing, no new fever, cough, confusion, respiratory distress. Still chest pain from compressions.  Objective: Vitals:   05/25/18 1207 05/25/18 1212 05/25/18 1230 05/25/18 1300  BP: (!) 171/1 (!) 167/78 (!) 170/83 (!) 172/84  Pulse: 73 72 69 72  Resp:      Temp: (!) 97.3 F (36.3 C)     TempSrc: Axillary     SpO2:      Weight: 86.2 kg     Height:        Intake/Output Summary (Last 24 hours) at 05/25/2018 1419 Last data filed at 05/24/2018 2016 Gross per 24 hour  Intake -  Output 450 ml  Net -450 ml   Filed Weights   05/24/18 0431 05/25/18 0500 05/25/18 1207  Weight: 86.6 kg 86.2 kg 86.2 kg    Examination: General appearance: Obese adult female, seen on dialysis, sleepy.   HEENT: Anicteric, conjunctival pink, lids and lashes normal.  No nasal deformity, discharge, or epistaxis.  Nasal cannula in place, lips moist, dentition poor, normal oropharynx, no oral lesions. Skin: Warm and dry without suspicious rashes or lesions. Cardiac: Tachycardic, no murmurs, JVP not  visible, no lower extremity edema. Respiratory: Normal respiratory effort, lungs diminished bilaterally.  No wheezing. Abdomen: Abdomen soft, no tenderness palpation, no ascites, distention, or hepatosplenomegaly. MSK: No deformities or effusions. Neuro: Sleepy, opens eyes to stimulus, but does not follow commands.    Psych: Unable to assess    Data Reviewed: I have personally reviewed following  labs and imaging studies:  CBC: Recent Labs  Lab 05/21/18 0430 05/22/18 0337 05/23/18 0437 05/24/18 0444 05/25/18 0426  WBC 12.2* 12.7* 11.4* 9.7 10.4  HGB 10.0* 10.0* 10.3* 9.7* 9.8*  HCT 31.7* 31.2* 33.0* 30.7* 31.7*  MCV 94.6 94.8 95.4 93.6 94.9  PLT 158 180 214 212 850   Basic Metabolic Panel: Recent Labs  Lab 05/19/18 0226  05/21/18 0430 05/22/18 0337 05/23/18 0437 05/24/18 0444 05/25/18 0426  NA 138   < > 139 138 137 137 138  K 6.0*   < > 4.1 5.1 4.0 3.9 4.1  CL 95*   < > 96* 99 97* 93* 98  CO2 24   < > _0 GLUCOSE 125*   < > 75 73 80 93 79  BUN 163*   < > 45* 65* 37* 53* 64*  CREATININE 7.02*   < > 3.79* 5.19* 4.16* 5.48* 6.52*  CALCIUM 8.9   < > 8.8* 8.2* 9.0 8.9 8.9  MG 2.6*   < > 2.1 2.2 2.1 2.2 2.3  PHOS 6.8*  --   --   --   --   --   --    < > = values in this interval not displayed.   GFR: Estimated Creatinine Clearance: 10.5 mL/min (A) (by C-G formula based on SCr of 6.52 mg/dL (H)). Liver Function Tests: Recent Labs  Lab 05/19/18 0226 05/21/18 0430  AST 64* 55*  ALT 748* 435*  ALKPHOS 81 68  BILITOT 0.8 0.7  PROT 5.6* 5.8*  ALBUMIN 2.5*  2.4* 2.7*   No results for input(s): LIPASE, AMYLASE in the last 168 hours. Recent Labs  Lab 05/20/18 1231  AMMONIA 35   Coagulation Profile: No results for input(s): INR, PROTIME in the last 168 hours. Cardiac Enzymes: Recent Labs  Lab 05/19/18 0226  CKTOTAL 276*   BNP (last 3 results) No results for input(s): PROBNP in the last 8760 hours. HbA1C: No results for input(s): HGBA1C in the last 72 hours. CBG: Recent Labs  Lab 05/24/18 1615 05/24/18 2003 05/25/18 0016 05/25/18 0417 05/25/18 0754  GLUCAP 103* 124* 89 81 86   Lipid Profile: No results for input(s): CHOL, HDL, LDLCALC, TRIG, CHOLHDL, LDLDIRECT in the last 72 hours. Thyroid Function Tests: No results for input(s): TSH, T4TOTAL, FREET4, T3FREE, THYROIDAB in the last 72 hours. Anemia Panel: No results for input(s):  VITAMINB12, FOLATE, FERRITIN, TIBC, IRON, RETICCTPCT in the last 72 hours. Urine analysis:    Component Value Date/Time   COLORURINE YELLOW 05/21/2018 0901   APPEARANCEUR HAZY (A) 05/21/2018 0901   LABSPEC 1.009 05/21/2018 0901   PHURINE 6.0 05/21/2018 0901   GLUCOSEU NEGATIVE 05/21/2018 0901   HGBUR SMALL (A) 05/21/2018 0901   BILIRUBINUR NEGATIVE 05/21/2018 0901   KETONESUR NEGATIVE 05/21/2018 0901   PROTEINUR 100 (A) 05/21/2018 0901   NITRITE NEGATIVE 05/21/2018 0901   LEUKOCYTESUR NEGATIVE 05/21/2018 0901   Sepsis Labs: _1 (procalcitonin:4,lacticacidven:4)  ) Recent Results (from the past 240 hour(s))  Culture, blood (routine x 2)     Status: None (Preliminary result)   Collection Time: 05/21/18 11:25 AM  Result Value Ref  Range Status   Specimen Description BLOOD LEFT HAND  Final   Special Requests   Final    BOTTLES DRAWN AEROBIC ONLY Blood Culture adequate volume   Culture   Final    NO GROWTH 4 DAYS Performed at [facility], [address], [place], [place] [address]    Report Status PENDING  Incomplete  Culture, blood (routine x 2)     Status: None (Preliminary result)   Collection Time: 05/21/18 11:36 AM  Result Value Ref Range Status   Specimen Description BLOOD RIGHT HAND  Final   Special Requests   Final    BOTTLES DRAWN AEROBIC ONLY Blood Culture adequate volume   Culture   Final    NO GROWTH 4 DAYS Performed at [facility], [place] [address] [place], [place] [address]    Report Status PENDING  Incomplete         Radiology Studies: Dg Chest Port 1 View  Result Date: 05/24/2018 CLINICAL DATA:  Hypoxia   Patient a little AMS EXAM: PORTABLE CHEST - 1 VIEW COMPARISON:  05/18/2018 FINDINGS: Patient has been extubated, and the gastric tube removed. Right IJ dialysis catheter stable. Left infrahilar interstitial opacity stable. Improvement in mild edema or infiltrates. Stable calcified upper lobe granulomas Heart size upper limits normal for  technique. No effusion. No pneumothorax. Visualized bones unremarkable. IMPRESSION: Extubation with improvement in bilateral edema/infiltrates. Electronically Signed   By: Lucrezia Europe M.D.   On: 05/24/2018 08:37        Scheduled Meds: . ALPRAZolam  0.25 mg Oral TID  . chlorhexidine gluconate (MEDLINE KIT)  15 mL Mouth Rinse BID  . Chlorhexidine Gluconate Cloth  6 each Topical Daily  . Chlorhexidine Gluconate Cloth  6 each Topical Q0600  . escitalopram  10 mg Oral Daily  . feeding supplement (NEPRO CARB STEADY)  237 mL Oral TID BM  . heparin  1,000 Units Dialysis Once in dialysis  . [START ON 05/27/2018] heparin  5,000 Units Subcutaneous Q8H  . insulin aspart  0-15 Units Subcutaneous Q4H  . ipratropium-albuterol  3 mL Nebulization TID  . mouth rinse  15 mL Mouth Rinse BID  . methylPREDNISolone (SOLU-MEDROL) injection  40 mg Intravenous Daily  . pantoprazole  40 mg Oral Daily  . sodium polystyrene  30 g Rectal Once   Continuous Infusions: . sodium chloride    . sodium chloride    . [START ON 05/26/2018]  ceFAZolin (ANCEF) IV       LOS: 13 days    Time spent: 25 minutes     Edwin Dada, MD [facility] 05/25/2018, 2:19 PM     Please page through [place]:  [email or web address] Password [email or web address] If 7PM-7AM, please contact night-coverage

## 2018-05-25 NOTE — Progress Notes (Signed)
[facility] NEPHROLOGY PROGRESS NOTE  Assessment/ Plan: Pt is a 48 y.o. yo female status post cardiac arrest of unclear duration, VDRF and anuric AKI.  # AKI likely ischemic ATN: Kidney ultrasound with no obstruction done on 4/27, sub-nephrotic proteinuria.   -Started dialysis on 4/28.  Last dialysis on 5/4.  -Patient has marginal urine output with worsening creatinine level. She started having nausea, weakness and mild SOB. No sign of renal recovery at this time with some sign of uremia. Plan for dialysis today. Placed order for IR guided tunnel catheter placement, keep npo midnight. Needs CLIP for AKI. -She has temporary HD catheter currently. I have discussed this with the patient and she agreed.   #Cardiac arrest: Presumed due to respiratory arrest  #Acute on chronic respiratory failure with hypoxia: Currently on nasal cannula.  Repeat Chest x-ray reviewed.  #Aspiration pneumonia, COPD exacerbation: Completed antibiotics course.  #Acute encephalopathy: Multifactorial etiology, mental status is improving.  Recommend to minimize sedatives.  Now on dysphagia diet.  Subjective: Seen and examined at bedside. Feels weak, nauseated and mild SOB. Marginal urine output of 450 cc. No chest pain. Objective Vital signs in last 24 hours: Vitals:   05/25/18 0500 05/25/18 0517 05/25/18 0635 05/25/18 0829  BP:  (!) 161/83    Pulse:  71    Resp:  16    Temp:  97.6 F (36.4 C)    TempSrc:  Oral    SpO2:  95% 99% 98%  Weight: 86.2 kg     Height:       Weight change: -0.454 kg  Intake/Output Summary (Last 24 hours) at 05/25/2018 1030 Last data filed at 05/24/2018 2016 Gross per 24 hour  Intake -  Output 450 ml  Net -450 ml       Labs: Basic Metabolic Panel: Recent Labs  Lab 05/19/18 0226  05/23/18 0437 05/24/18 0444 05/25/18 0426  NA 138   < > 137 137 138  K 6.0*   < > 4.0 3.9 4.1  CL 95*   < > 97* 93* 98  CO2 24   < > _0 GLUCOSE 125*   < > 80 93 79  BUN  163*   < > 37* 53* 64*  CREATININE 7.02*   < > 4.16* 5.48* 6.52*  CALCIUM 8.9   < > 9.0 8.9 8.9  PHOS 6.8*  --   --   --   --    < > = values in this interval not displayed.   Liver Function Tests: Recent Labs  Lab 05/19/18 0226 05/21/18 0430  AST 64* 55*  ALT 748* 435*  ALKPHOS 81 68  BILITOT 0.8 0.7  PROT 5.6* 5.8*  ALBUMIN 2.5*  2.4* 2.7*   No results for input(s): LIPASE, AMYLASE in the last 168 hours. Recent Labs  Lab 05/20/18 1231  AMMONIA 35   CBC: Recent Labs  Lab 05/21/18 0430 05/22/18 0337 05/23/18 0437 05/24/18 0444 05/25/18 0426  WBC 12.2* 12.7* 11.4* 9.7 10.4  HGB 10.0* 10.0* 10.3* 9.7* 9.8*  HCT 31.7* 31.2* 33.0* 30.7* 31.7*  MCV 94.6 94.8 95.4 93.6 94.9  PLT 158 180 214 212 244   Cardiac Enzymes: Recent Labs  Lab 05/19/18 0226  CKTOTAL 276*   CBG: Recent Labs  Lab 05/24/18 1615 05/24/18 2003 05/25/18 0016 05/25/18 0417 05/25/18 0754  GLUCAP 103* 124* 89 81 86    Iron Studies: No results for input(s): IRON, TIBC, TRANSFERRIN, FERRITIN in the last 72 hours.  Studies/Results: Dg Chest Port 1 View  Result Date: 05/24/2018 CLINICAL DATA:  Hypoxia   Patient a little AMS EXAM: PORTABLE CHEST - 1 VIEW COMPARISON:  05/18/2018 FINDINGS: Patient has been extubated, and the gastric tube removed. Right IJ dialysis catheter stable. Left infrahilar interstitial opacity stable. Improvement in mild edema or infiltrates. Stable calcified upper lobe granulomas Heart size upper limits normal for technique. No effusion. No pneumothorax. Visualized bones unremarkable. IMPRESSION: Extubation with improvement in bilateral edema/infiltrates. Electronically Signed   By: Lucrezia Europe M.D.   On: 05/24/2018 08:37    Medications: Infusions:   Scheduled Medications: . ALPRAZolam  0.25 mg Oral TID  . chlorhexidine gluconate (MEDLINE KIT)  15 mL Mouth Rinse BID  . Chlorhexidine Gluconate Cloth  6 each Topical Daily  . Chlorhexidine Gluconate Cloth  6 each Topical  Q0600  . Chlorhexidine Gluconate Cloth  6 each Topical Q0600  . escitalopram  10 mg Oral Daily  . feeding supplement (NEPRO CARB STEADY)  237 mL Oral TID BM  . heparin  5,000 Units Subcutaneous Q8H  . insulin aspart  0-15 Units Subcutaneous Q4H  . ipratropium-albuterol  3 mL Nebulization TID  . mouth rinse  15 mL Mouth Rinse BID  . methylPREDNISolone (SOLU-MEDROL) injection  40 mg Intravenous Daily  . pantoprazole  40 mg Oral Daily  . sodium polystyrene  30 g Rectal Once    have reviewed scheduled and prn medications.  Physical Exam: General: NAD. Heart:RRR, s1s2 nl, no rubs Lungs: bibasal crackle, no wheeze Abdomen:soft, Non-tender, non-distended Extremities:trace edema Dialysis Access: IJ catheter.   Terez Montee Prasad [place] 05/25/2018,10:30 AM  LOS: 13 days

## 2018-05-25 NOTE — Progress Notes (Signed)
SLP Cancellation Note  Patient Details Name: Misty Green MRN: [id number] DOB: 1970/02/02   Cancelled treatment:       Reason Eval/Treat Not Completed: Patient at procedure or test/unavailable   Carroll Ranney, Katherene Ponto 05/25/2018, 12:28 PM

## 2018-05-25 NOTE — Progress Notes (Signed)
Pt. Already wearing non-invasive. 15/5, 2L of o2  Titrated in.

## 2018-05-25 NOTE — Research (Signed)
VITAK- 19 Antibody Fast Detection Kit Study  Informed Consent  Subject Name: Mandi R. Laws  This Patient Aishani Tiedeman, has been consented to the above reference Clinical Trial in compliance with FDA regulations, GCP Guidelines and [facility] [facility] requirements. The informed consent and study related procedures have been explained to the subject by Study Coordinator prior to obtaining consent. The subject expressed comprehension and requirements of the study and subject involved activities. No study procedures were conducted prior to obtained consent. The patient was given ample time to review the consent form. The subject was informed that this study is for the purpose of research and that participation was completely voluntary. All questions were answered to the satisfaction of the subject. The subject voluntarily signed consent version 1.0 on 25 May 2018 prior to any study procedures being done.. A copy of the signed consent was given to the patient and a copy was placed in the subject's medical record. The subject was thanked for their participation and contribution to science and research.   The subject suffers from a slight tremor that can be seen in her signature.  Sub-I Dr. Robert Byrum, participated in the consent process and signed the consent form.  Due to the nature of this study being of minimal risk, the [employer] Informed Consent Document Checklist and Coordinator Visit Worksheet will not be utilized for the inpatient arm of this trial.  Please refer to the subject paper source binder for further information regarding todays' visit.   T. Cameron  BS, CCRC, CPhT [employer] 

## 2018-05-26 ENCOUNTER — Inpatient Hospital Stay (HOSPITAL_COMMUNITY): Payer: 59

## 2018-05-26 ENCOUNTER — Encounter (HOSPITAL_COMMUNITY): Payer: Self-pay | Admitting: Interventional Radiology

## 2018-05-26 HISTORY — PX: IR US GUIDE VASC ACCESS RIGHT: IMG2390

## 2018-05-26 HISTORY — PX: IR FLUORO GUIDE CV LINE RIGHT: IMG2283

## 2018-05-26 LAB — BASIC METABOLIC PANEL
Anion gap: 13 (ref 5–15)
BUN: 53 mg/dL — ABNORMAL HIGH (ref 6–20)
CO2: 27 mmol/L (ref 22–32)
Calcium: 8.9 mg/dL (ref 8.9–10.3)
Chloride: 97 mmol/L — ABNORMAL LOW (ref 98–111)
Creatinine, Ser: 5.64 mg/dL — ABNORMAL HIGH (ref 0.44–1.00)
GFR calc Af Amer: 10 mL/min — ABNORMAL LOW (ref >60)
GFR calc non Af Amer: 8 mL/min — ABNORMAL LOW (ref >60)
Glucose, Bld: 91 mg/dL (ref 70–99)
Potassium: 4 mmol/L (ref 3.5–5.1)
Sodium: 137 mmol/L (ref 135–145)

## 2018-05-26 LAB — GLUCOSE, CAPILLARY
Glucose-Capillary: 112 mg/dL — ABNORMAL HIGH (ref 70–99)
Glucose-Capillary: 129 mg/dL — ABNORMAL HIGH (ref 70–99)
Glucose-Capillary: 61 mg/dL — ABNORMAL LOW (ref 70–99)
Glucose-Capillary: 80 mg/dL (ref 70–99)
Glucose-Capillary: 84 mg/dL (ref 70–99)
Glucose-Capillary: 95 mg/dL (ref 70–99)

## 2018-05-26 LAB — PROTIME-INR
INR: 1.1 (ref 0.8–1.2)
Prothrombin Time: 13.6 seconds (ref 11.4–15.2)

## 2018-05-26 LAB — CULTURE, BLOOD (ROUTINE X 2)
Culture: NO GROWTH
Culture: NO GROWTH
Special Requests: ADEQUATE
Special Requests: ADEQUATE

## 2018-05-26 MED ORDER — MELATONIN 3 MG PO TABS
3.0000 mg | ORAL_TABLET | Freq: Every evening | ORAL | Status: DC | PRN
  Filled 2018-05-26: qty 1

## 2018-05-26 MED ORDER — LIDOCAINE-EPINEPHRINE (PF) 1 %-1:200000 IJ SOLN
INTRAMUSCULAR | Status: AC | PRN
  Administered 2018-05-26: 20 mL

## 2018-05-26 MED ORDER — AMLODIPINE BESYLATE 5 MG PO TABS
5.0000 mg | ORAL_TABLET | Freq: Every day | ORAL | Status: DC
  Administered 2018-05-26 – 2018-05-27 (×2): 5 mg via ORAL
  Filled 2018-05-26 (×2): qty 1

## 2018-05-26 MED ORDER — LORAZEPAM 2 MG/ML IJ SOLN
1.0000 mg | Freq: Once | INTRAMUSCULAR | Status: AC
  Administered 2018-05-26: 1 mg via INTRAVENOUS

## 2018-05-26 MED ORDER — MIDAZOLAM HCL 2 MG/2ML IJ SOLN
INTRAMUSCULAR | Status: AC
  Filled 2018-05-26: qty 2

## 2018-05-26 MED ORDER — FENTANYL CITRATE (PF) 100 MCG/2ML IJ SOLN
INTRAMUSCULAR | Status: AC
  Filled 2018-05-26: qty 2

## 2018-05-26 MED ORDER — GELATIN ABSORBABLE 12-7 MM EX MISC
CUTANEOUS | Status: AC
  Filled 2018-05-26: qty 1

## 2018-05-26 MED ORDER — CHLORHEXIDINE GLUCONATE 4 % EX LIQD
CUTANEOUS | Status: AC
  Filled 2018-05-26: qty 15

## 2018-05-26 MED ORDER — HEPARIN SODIUM (PORCINE) 1000 UNIT/ML IJ SOLN
INTRAMUSCULAR | Status: AC
  Filled 2018-05-26: qty 1

## 2018-05-26 MED ORDER — LIDOCAINE-EPINEPHRINE (PF) 1 %-1:200000 IJ SOLN
INTRAMUSCULAR | Status: AC
  Filled 2018-05-26: qty 30

## 2018-05-26 MED ORDER — LORAZEPAM 2 MG/ML IJ SOLN
INTRAMUSCULAR | Status: AC
  Administered 2018-05-26: 1 mg via INTRAVENOUS
  Filled 2018-05-26: qty 1

## 2018-05-26 MED ORDER — NON FORMULARY: 3.0000 mg | Freq: Every evening | Status: DC | PRN

## 2018-05-26 MED ORDER — HEPARIN SODIUM (PORCINE) 1000 UNIT/ML IJ SOLN
INTRAMUSCULAR | Status: AC
  Administered 2018-05-26: 1700 [IU]
  Filled 2018-05-26: qty 4

## 2018-05-26 MED ORDER — TRAZODONE HCL 50 MG PO TABS: 50.0000 mg | ORAL_TABLET | Freq: Every evening | ORAL | Status: DC | PRN

## 2018-05-26 MED ORDER — LIDOCAINE HCL 1 % IJ SOLN
INTRAMUSCULAR | Status: AC
  Filled 2018-05-26: qty 20

## 2018-05-26 MED ORDER — FENTANYL CITRATE (PF) 100 MCG/2ML IJ SOLN
INTRAMUSCULAR | Status: AC | PRN
  Administered 2018-05-26: 25 ug via INTRAVENOUS

## 2018-05-26 MED ORDER — MIDAZOLAM HCL 2 MG/2ML IJ SOLN
INTRAMUSCULAR | Status: AC | PRN
  Administered 2018-05-26: 1 mg via INTRAVENOUS

## 2018-05-26 MED ORDER — ACETAMINOPHEN 325 MG PO TABS
ORAL_TABLET | ORAL | Status: AC
  Administered 2018-05-26: 650 mg via ORAL
  Filled 2018-05-26: qty 2

## 2018-05-26 NOTE — Progress Notes (Signed)
Inpatient Rehab Admissions Coordinator:   I have insurance authorization and a bed available for admission to inpatient rehab tomorrow (Saturday 05/27/2018).  Dr. Loleta Books aware.  Dr. Naaman Plummer (rehab MD) will confirm admission tomorrow.  Pt's RN on [facility] can contact [place] station [phone]) tomorrow at noon to give report.   Shann Medal, PT, DPT Admissions Coordinator [phone] 05/26/18  5:17 PM

## 2018-05-26 NOTE — Progress Notes (Signed)
Patient has been accepted at [facility] for OP HD. She will have a TTS schedule on second shift, but exact seat time has not yet been determined, since at this time, Renal Navigator has been informed that plan for patient is CIR. Renal Navigator will follow up with patient's husband once seat time is obtained and plan for patient is confirmed.  [place], [place]  Alphonzo Cruise Renal Navigator  [phone]

## 2018-05-26 NOTE — Progress Notes (Signed)
Inpatient Diabetes Program Recommendations  AACE/ADA: New Consensus Statement on Inpatient Glycemic Control (2015)  Target Ranges:  Prepandial:   less than 140 mg/dL      Peak postprandial:   less than 180 mg/dL (1-2 hours)      Critically ill patients:  140 - 180 mg/dL   Lab Results  Component Value Date   GLUCAP 80 05/26/2018    Review of Glycemic Control Results for ETHELL, BLATCHFORD (MRN [id number]) as of 05/26/2018 10:38  Ref. Range 05/26/2018 04:43  Glucose Latest Ref Range: 70 - 99 mg/dL 91    Inpatient Diabetes Program Recommendations:     Noted low glucose this aM following Novolog 3 units. Even with Solumedrol would recommend to decrease correction to Novolog 0-9 units TID given renal status.  Thanks, Bronson Curb, MSN, RNC-OB Diabetes Coordinator [phone] (8a-5p)

## 2018-05-26 NOTE — PMR Pre-admission (Signed)
PMR Admission Coordinator Pre-Admission Assessment  Patient: Misty Green is an 48 y.o., female MRN: [id number] DOB: November 16, 1970 Height: 5' 1" (154.9 cm) Weight: 86.5 kg              Insurance Information HMO:     PPO: yes     PCP:      IPA:      80/20:      OTHER:  PRIMARY: [employer]      Policy#: [id number]      Subscriber: pt's spouse CM Name:       Phone#:      Fax#:  Pre-Cert#: [id number] auto-approved for 5/9 through 5/11 with updates due on 5/11     Employer:  Benefits:  Phone #: [phone]     Name:  Eff. Date: 01/18/2018     Deduct: $600 (met)   Out of Pocket Max: $2500 [phone] met, includes deductible) Life Max: n/a CIR: 80%      SNF: 80% limited to 150 days Outpatient: $25 copay, limited to medical review for necessity after 40 visits Home Health: 80%      Co-Pay:  DME: 80%     Co-Pay:  Providers:  SECONDARY:       Policy#:       Subscriber:  CM Name:       Phone#:      Fax#:  Pre-Cert#:       Employer:  Benefits:  Phone #:      Name:  Eff. Date:      Deduct:       Out of Pocket Max:       Life Max:  CIR:       SNF:  Outpatient:      Co-Pay:  Home Health:       Co-Pay:  DME:      Co-Pay:   Medicaid Application Date:       Case Manager:  Disability Application Date:       Case Worker:   The "Data Collection Information Summary" for patients in Inpatient Rehabilitation Facilities with attached "Privacy Act [place] Records" was provided and verbally reviewed with: N/A  Emergency Contact Information Contact Information    Name Relation Home Work Mobile   Halbig,Bill Spouse [phone]  [phone]   Bobbye Morton [phone]  [phone]   Atha, Muradyan Daughter   [phone]   Cintia, Gleed   [phone]     Current Medical History  Patient Admitting Diagnosis: encephalopathy 2/2 cardiac arrest  History of Present Illness: Misty Green is a 48 y.o. female with history of COPD and uses CPAP, diastolic congestive  heart failure, spinal stenosis, diverticulosis status post colostomy, opioid abuse.  Presented 05/12/2018 with nasal congestion and occasional shortness of breath.  Patient with increasing headache and decrease in appetite complaints of hot flashes.  She was found unresponsive family called 911.  First responders did CPR for approximately 15 minutes.  Patient did require intubation/ventilatory support.  COVID negative.  Placed on vasopressors.  CT/MRI the brain negative for acute changes.  EEG negative for seizure.  Echocardiogram with ejection fraction of 50% normal systolic function.  Renal services consulted 05/15/2018 for elevated creatinine baseline 1.4-4.25.  AKI likely ischemic ATN related to cardiac arrest.  She has been receiving dialysis (TTS schedule).  Patient did complete a course of antibiotics for aspiration pneumonia.  Noted delirium agitation suspect acute encephalopathy due to cardiac arrest.  Dysphasia #2 nectar thick liquids.  Subcutaneous heparin for DVT prophylaxis.  Therapy evaluations completed recommendations of physical medicine rehab consult  Past Medical History  Past Medical History:  Diagnosis Date  . CHF (congestive heart failure) ([place])   . COPD (chronic obstructive pulmonary disease) ([place])   . Current smoker   . Opiate abuse, continuous ([facility])     Family History  family history includes COPD in her mother; Diabetes in her father; Hypertension in her father.  Prior Rehab/Hospitalizations:  Has the patient had prior rehab or hospitalizations prior to admission? Yes, admitted for perforated diverticulitis in December with colostomy  Has the patient had major surgery during 100 days prior to admission? Yes, colostomy in Dec 2019  Current Medications   Current Facility-Administered Medications:  .  acetaminophen (TYLENOL) tablet 650 mg, 650 mg, Oral, Q6H PRN, Edwin Dada, MD, 650 mg at 05/26/18 1333 .  albuterol (PROVENTIL) (2.5 MG/3ML) 0.083% nebulizer  solution 2.5 mg, 2.5 mg, Nebulization, Q3H PRN, Wilhelmina Mcardle, MD .  ALPRAZolam Duanne Moron) tablet 0.25 mg, 0.25 mg, Oral, TID, Danford, Suann Larry, MD, 0.25 mg at 05/26/18 0921 .  amLODipine (NORVASC) tablet 5 mg, 5 mg, Oral, Daily, Danford, Suann Larry, MD, 5 mg at 05/26/18 0920 .  ceFAZolin (ANCEF) IVPB 2g/100 mL premix, 2 g, Intravenous, to Edward Jolly, PA-C .  chlorhexidine gluconate (MEDLINE KIT) (PERIDEX) 0.12 % solution 15 mL, 15 mL, Mouth Rinse, BID, Scatliffe, Kristen D, MD, 15 mL at 05/24/18 2058 .  Chlorhexidine Gluconate Cloth 2 % PADS 6 each, 6 each, Topical, Daily, Scatliffe, Rise Paganini, MD, 6 each at 05/23/18 0000 .  Chlorhexidine Gluconate Cloth 2 % PADS 6 each, 6 each, Topical, Q0600, [name], MD, 6 each at 05/25/18 [phone] .  Chlorhexidine Gluconate Cloth 2 % PADS 6 each, 6 each, Topical, Q0600, Rosita Fire, MD .  escitalopram (LEXAPRO) tablet 10 mg, 10 mg, Oral, Daily, Allie Bossier, MD, 10 mg at 05/26/18 4401 .  feeding supplement (NEPRO CARB STEADY) liquid 237 mL, 237 mL, Oral, TID BM, Florencia Reasons, MD, Stopped at 05/26/18 [phone] .  [START ON 05/27/2018] heparin injection 5,000 Units, 5,000 Units, Subcutaneous, Q8H, Turpin, Pamela, PA-C .  insulin aspart (novoLOG) injection 0-15 Units, 0-15 Units, Subcutaneous, Q4H, Wilhelmina Mcardle, MD, 3 Units at 05/25/18 2100 .  ipratropium-albuterol (DUONEB) 0.5-2.5 (3) MG/3ML nebulizer solution 3 mL, 3 mL, Nebulization, TID, Danford, Suann Larry, MD, 3 mL at 05/26/18 0836 .  MEDLINE mouth rinse, 15 mL, Mouth Rinse, BID, Sampson Goon, MD, 15 mL at 05/26/18 0931 .  methylPREDNISolone sodium succinate (SOLU-MEDROL) 40 mg/mL injection 40 mg, 40 mg, Intravenous, Daily, Icard, Bradley L, DO, 40 mg at 05/26/18 0930 .  pantoprazole (PROTONIX) EC tablet 40 mg, 40 mg, Oral, Daily, Danford, Suann Larry, MD, 40 mg at 05/26/18 0921 .  [employer], , Oral, PRN, Audria Nine, DO .  sodium chloride flush (NS)  0.9 % injection 10-40 mL, 10-40 mL, Intracatheter, PRN, Scatliffe, Kristen D, MD, 10 mL at 05/23/18 2235 .  sodium chloride flush (NS) 0.9 % injection 10-40 mL, 10-40 mL, Intracatheter, PRN, Florencia Reasons, MD .  sodium polystyrene (KAYEXALATE) 15 GM/60ML suspension 30 g, 30 g, Rectal, Once, Frederik Pear, MD, Stopped at 05/19/18 0239  Patients Current Diet:  Diet Order            Diet NPO time specified Except for: Sips with Meds  Diet effective midnight              Precautions / Restrictions Precautions  Precautions: Fall Precaution Comments: on 3L 02 at baseline Restrictions Weight Bearing Restrictions: No   Has the patient had 2 or more falls or a fall with injury in the past year?No  Prior Activity Level Community (5-7x/wk): very active, takes care of her 3 grandchildren  Prior Functional Level Prior Function Level of Independence: Independent Comments: Independent with all ADL/IADL tasks and mobility; takes care of her 3 grandchildren per admit in 2019  Self Care: Did the patient need help bathing, dressing, using the toilet or eating?  Independent  Indoor Mobility: Did the patient need assistance with walking from room to room (with or without device)? Independent  Stairs: Did the patient need assistance with internal or external stairs (with or without device)? Independent  Functional Cognition: Did the patient need help planning regular tasks such as shopping or remembering to take medications? Independent  Home Assistive Devices / Equipment Home Equipment: None  Prior Device Use: Indicate devices/aids used by the patient prior to current illness, exacerbation or injury? None of the above  Current Functional Level Cognition  Arousal/Alertness: Awake/alert Overall Cognitive Status: Impaired/Different from baseline Current Attention Level: Sustained Orientation Level: Oriented to person, Oriented to place, Oriented to situation, Disoriented to time Following  Commands: Follows one step commands with increased time Safety/Judgement: Decreased awareness of safety, Decreased awareness of deficits General Comments: pt with difficulty recalling during conversation Attention: Sustained Sustained Attention: Impaired Sustained Attention Impairment: Functional basic, Verbal basic Memory: Impaired Memory Impairment: Storage deficit, Retrieval deficit, Decreased recall of new information Awareness: Impaired Awareness Impairment: Intellectual impairment, Emergent impairment, Anticipatory impairment Safety/Judgment: Impaired    Extremity Assessment (includes Sensation/Coordination)  Upper Extremity Assessment: Generalized weakness  Lower Extremity Assessment: Generalized weakness RLE Coordination: decreased gross motor LLE Coordination: decreased gross motor    ADLs  Overall ADL's : Needs assistance/impaired Eating/Feeding: Moderate assistance, Bed level(in chair position) Grooming: Oral care, Sitting, Minimal assistance Grooming Details (indicate cue type and reason): matting noted in hair. Shampoo cap applied and attempted to de-tangle with comb. no success today. RN aware. Pt will need detangler and significant amt of time to work on matting- potentially might need it cut out Upper Body Bathing: Minimal assistance Lower Body Bathing: Maximal assistance Upper Body Dressing : Moderate assistance, Sitting Upper Body Dressing Details (indicate cue type and reason): to doff front opening gown Lower Body Dressing: Maximal assistance Lower Body Dressing Details (indicate cue type and reason): to don socks Toilet Transfer: Minimal assistance, +2 for physical assistance, +2 for safety/equipment, Ambulation(EVA walker) Toilet Transfer Details (indicate cue type and reason): face to face transfer Toileting- Clothing Manipulation and Hygiene: Maximal assistance, Sit to/from stand Toileting - Clothing Manipulation Details (indicate cue type and reason): Pt with  colostomy bag at baseline, no balance for perianal care Functional mobility during ADLs: Min guard, Rolling walker General ADL Comments: confusion, agitation about not being able to leave.    Mobility  Overal bed mobility: Needs Assistance Bed Mobility: Sit to Supine Supine to sit: Min assist Sit to supine: Supervision General bed mobility comments: no physical assist needed    Transfers  Overall transfer level: Needs assistance Equipment used: Rolling walker (2 wheeled) Transfers: Sit to/from Stand, Stand Pivot Transfers Sit to Stand: Min assist Stand pivot transfers: Min assist General transfer comment: sit to stand with assist for stability.    Ambulation / Gait / Stairs / Wheelchair Mobility  Ambulation/Gait Ambulation/Gait assistance: Min assist, Mod assist, +2 physical assistance Gait Distance (Feet): 155 Feet Assistive device: Bilateral platform  walker(EVA walker ) Gait Pattern/deviations: Scissoring, Staggering left, Staggering right, Narrow base of support, Trunk flexed, Leaning posteriorly, Step-through pattern, Decreased stride length General Gait Details: Pt was able to walk with [name] walker with cues and assist for stability as pt was scissoring at times. Cues for safety.  unsteady at times.  Remained on 4 lts to achieve sats > 90% Gait velocity: decreased  Gait velocity interpretation: <1.31 ft/sec, indicative of household ambulator    Posture / Balance Balance Overall balance assessment: Needs assistance Sitting-balance support: Feet supported, Single extremity supported Sitting balance-Leahy Scale: Fair Standing balance support: Single extremity supported Standing balance-Leahy Scale: Poor Standing balance comment: in static standing    Special needs/care consideration BiPAP/CPAP CPAP at nigh CPM no Continuous Drip IV no Dialysis yes        Days Tues/Thurs/Sat Life Vest no Oxygen yes, 2L via nasal cannula Special Bed no Trach Size no Wound Vac (area) no       Location n/a Skin ecchymosis to BUEs                            Bowel mgmt: continent/ostomy 05/26/2018 Bladder mgmt: continent Diabetic mgmt no Behavioral consideration no Chemo/radiation no     Previous Home Environment (from acute therapy documentation) Living Arrangements: Spouse/significant other  Lives With: Spouse Available Help at Discharge: Family Type of Home: House Home Layout: One level Home Access: Stairs to enter Entrance Stairs-Rails: Can reach both Entrance Stairs-Number of Steps: 5 back; 4 in front Bathroom Shower/Tub: Chiropodist: Handicapped height [place]: No  Discharge Living Setting Plans for Discharge Living Setting: Patient's home Type of Home at Discharge: House Discharge Home Layout: One level Discharge Home Access: Stairs to enter Entrance Stairs-Rails: Can reach both Entrance Stairs-Number of Steps: 5 in the back, 4 in the front Discharge Bathroom Shower/Tub: Tub/shower unit Discharge Bathroom Toilet: Handicapped height Discharge Bathroom Accessibility: Yes How Accessible: Accessible via walker Does the patient have any problems obtaining your medications?: No  Social/Family/Support Systems Patient Roles: Caregiver, Spouse, Parent Anticipated Caregiver: Husband Data processing manager) and Daughter Aniceto Boss) Anticipated Caregiver's Contact Information: [name] [phone] Ability/Limitations of Caregiver: [name] can provide supervision to min assist. First plan to go home with [name] if pt truly reaches supervision/min assist.  If she needs more help, Aniceto Boss and [name] are open to pt coming to [facility] home.  Caregiver Availability: 24/7 Discharge Plan Discussed with Primary Caregiver: Yes(discussed with [name] and Tasha) Is Caregiver In Agreement with Plan?: Yes Does Caregiver/Family have Issues with Lodging/Transportation while Pt is in Rehab?: No   Goals/Additional Needs Patient/Family Goal for Rehab: PT/OT/SLP  supervision/min assist Expected length of stay: 20-30 days Dietary Needs: thin Equipment Needs: tbd Pt/Family Agrees to Admission and willing to participate: Yes Program Orientation Provided & Reviewed with Pt/Caregiver Including Roles  & Responsibilities: Yes     Possible need for SNF placement upon discharge: not anticipated   Patient Condition: This patient's medical and functional status has changed since the consult dated: 05/22/2018 in which the Rehabilitation Physician determined and documented that the patient's condition is appropriate for intensive rehabilitative care in an inpatient rehabilitation facility. See "History of Present Illness" (above) for medical update. Functional changes are: pt currently min to mod assist +2 for mobility. Patient's medical and functional status update has been discussed with the Rehabilitation physician and patient remains appropriate for inpatient rehabilitation. Will admit to inpatient rehab today.  Preadmission Screen Completed By:  Michel Santee, PT, DPT 05/26/2018 1:45 PM ______________________________________________________________________   Discussed status with Dr. Naaman Plummer on 05/26/18 at 1:52 PM  and received approval for admission today.  Admission Coordinator:  Michel Santee, DPT time 1:53 PM Sudie Grumbling 05/26/18

## 2018-05-26 NOTE — H&P (Signed)
Physical Medicine and Rehabilitation Admission H&P    Chief Complaint  Patient presents with  . Functional deficits due to metabolic encephalopathy/debility    HPI:  Misty Green is a 48 year old female with history of COPD, OSA- 3 L oxygen at nights, CHF, spinal stenosis who was admitted on 05/12/18 after bing found unresponsive and required 13-14 minutes of CPR by EMS. Per reports, patient had been feeling bad since 4/19 and had been sleeping for most of the day with husband checking in on her every 30-45 minutes. She was intubated in ED and started on epinephrine. She was started on IV antibiotics and steroids for acute hypoxic/hypercapneic failure with presumed aspiration event. 2 D echo showed EF 60-65% with normal systolic function. EEG done due to encephalopathy and showed discontinuous generalized background slowing. Neurology expressed concerns about recovery from hypoxic injury and CT/MRI head were negative for acute changes or anoxic injury. She developed oliguric renal failure with rise in SCr due to AKI and was started on HD on 4/28.  She was extubated to BIPAP on 4/30 and has had issues with agitation. She has had poor renal recovery and temporary HD catheter  changed to tunneled IJ by Dr. Annamaria Boots on 5/8. Mentation improving but she continues to be limited by hypoxia, debility and cognitive deficits.  Repeat MRI brain 5/5 was negative for infarct or global anoxic injury. CIR recommended due to functional decline.    Review of Systems  Constitutional: Negative for chills and fever.  HENT: Negative for hearing loss.   Eyes: Negative for blurred vision.  Respiratory: Positive for cough.   Cardiovascular: Positive for chest pain (mid sternal chest pain).  Gastrointestinal: Negative for heartburn and nausea.  Genitourinary: Negative for dysuria.  Musculoskeletal: Positive for neck pain (near line placement).  Skin: Negative for rash.  Neurological: Positive for sensory change  (LLE due to nerve damage) and focal weakness (LLE due to nerve injury).  Psychiatric/Behavioral: Positive for memory loss.     Past Medical History:  Diagnosis Date  . CHF (congestive heart failure) ([place])   . COPD (chronic obstructive pulmonary disease) ([place])   . Current smoker   . Opiate abuse, continuous ([place])     Past Surgical History:  Procedure Laterality Date  . ABDOMINAL HYSTERECTOMY    . BACK SURGERY    . cesction    . COLOSTOMY  01/13/2018   Procedure: COLOSTOMY Creation;  Surgeon: Jules Husbands, MD;  Location: [facility] ORS;  Service: General;;  . INCISIONAL HERNIA REPAIR     lower midline laparotomy incision (hysterectomy), repaired with mesh  . LAPAROSCOPIC CHOLECYSTECTOMY  2012  . LAPAROSCOPIC LYSIS OF ADHESIONS  01/13/2018   Procedure: LAPAROSCOPIC LYSIS OF ADHESIONS;  Surgeon: Jules Husbands, MD;  Location: [facility] ORS;  Service: General;;  . LAPAROSCOPIC SIGMOID COLECTOMY  01/13/2018   Procedure: LAPAROSCOPIC SIGMOID COLECTOMY;  Surgeon: Jules Husbands, MD;  Location: [facility] ORS;  Service: General;;  . ORIF WRIST FRACTURE Right 08/25/2015   Procedure: OPEN REDUCTION INTERNAL FIXATION (ORIF) WRIST FRACTURE;  Surgeon: Corky Mull, MD;  Location: [facility] ORS;  Service: Orthopedics;  Laterality: Right;    Family History  Problem Relation Age of Onset  . COPD Mother   . Hypertension Father   . Diabetes Father     Social History:  Married. Independent PTA. She reports that she has quit smoking. Her smoking use included cigarettes. She has a 15.00 pack-year smoking history. She has never used smokeless tobacco.  Per reports current drug use. Drug: Marijuana. She reports that she does not drink alcohol.   Allergies  Allergen Reactions  . Other Nausea And Vomiting    Anti-depressent that starts with t    Medications Prior to Admission  Medication Sig Dispense Refill  . acetaminophen (TYLENOL) 500 MG tablet Take 500 mg by mouth every 6 (six) hours as needed for mild pain or  fever.    Marland Kitchen albuterol (PROVENTIL HFA;VENTOLIN HFA) 108 (90 Base) MCG/ACT inhaler Inhale into the lungs every 6 (six) hours as needed for wheezing or shortness of breath.    . ALPRAZolam (XANAX) 1 MG tablet Take 1 mg by mouth 4 (four) times daily.    . Ascorbic Acid (VITAMIN C) 1000 MG tablet Take 1,000 mg by mouth daily.    . diphenhydrAMINE (BENADRYL) 50 MG capsule Take 50 mg by mouth at bedtime as needed for sleep.    Marland Kitchen escitalopram (LEXAPRO) 10 MG tablet Take 10 mg by mouth daily.    . Fluticasone-Salmeterol (ADVAIR) 250-50 MCG/DOSE AEPB Inhale 1 puff into the lungs 2 (two) times daily.    . furosemide (LASIX) 40 MG tablet Take 40 mg by mouth daily as needed for fluid.     . Multiple Vitamins-Minerals (MULTIVITAMIN WITH MINERALS) tablet Take 1 tablet by mouth daily.    . pantoprazole (PROTONIX) 40 MG tablet Take 40 mg by mouth daily.    . roflumilast (DALIRESP) 500 MCG TABS tablet Take 500 mcg by mouth daily.    Marland Kitchen tiotropium (SPIRIVA) 18 MCG inhalation capsule Place 18 mcg into inhaler and inhale daily.    . cyclobenzaprine (FLEXERIL) 5 MG tablet Take 1 tablet (5 mg total) by mouth 3 (three) times daily. (Patient not taking: Reported on 05/13/2018) 30 tablet 0  . docusate sodium (COLACE) 100 MG capsule Take 100 mg by mouth 2 (two) times daily.    Marland Kitchen gabapentin (NEURONTIN) 300 MG capsule Take 800 mg by mouth 3 (three) times daily.     . metaxalone (SKELAXIN) 400 MG tablet Take 1 tablet (400 mg total) by mouth every 8 (eight) hours as needed for muscle spasms. (Patient not taking: Reported on 05/13/2018) 20 tablet 0  . ondansetron (ZOFRAN ODT) 4 MG disintegrating tablet Take 1 tablet (4 mg total) by mouth every 8 (eight) hours as needed for nausea or vomiting. (Patient not taking: Reported on 05/13/2018) 20 tablet 0  . ondansetron (ZOFRAN) 8 MG tablet Take 1 tablet (8 mg total) by mouth every 8 (eight) hours as needed for nausea or vomiting. 20 tablet 0  . zolpidem (AMBIEN) 10 MG tablet Take 10 mg by  mouth at bedtime as needed for sleep.      Drug Regimen Review  Drug regimen was reviewed and remains appropriate with no significant issues identified  Home: Home Living Family/patient expects to be discharged to:: Private residence Living Arrangements: Spouse/significant other Available Help at Discharge: Family Type of Home: House Home Access: Stairs to enter [employer] of Steps: 5 back; 4 in front Entrance Stairs-Rails: Can reach both Home Layout: One level Bathroom Shower/Tub: Chiropodist: Handicapped height Home Equipment: None  Lives With: Spouse   Functional History: Prior Function Level of Independence: Independent Comments: Independent with all ADL/IADL tasks and mobility; takes care of her 3 grandchildren per admit in 2019  Functional Status:  Mobility: Bed Mobility Overal bed mobility: Needs Assistance Bed Mobility: Sit to Supine Supine to sit: Min assist Sit to supine: Supervision General bed mobility comments:  no physical assist needed Transfers Overall transfer level: Needs assistance Equipment used: Rolling walker (2 wheeled) Transfers: Sit to/from Stand, Stand Pivot Transfers Sit to Stand: Min assist Stand pivot transfers: Min assist General transfer comment: sit to stand with assist for stability. Ambulation/Gait Ambulation/Gait assistance: Min assist, Mod assist, +2 physical assistance Gait Distance (Feet): 155 Feet Assistive device: Bilateral platform walker(EVA walker ) Gait Pattern/deviations: Scissoring, Staggering left, Staggering right, Narrow base of support, Trunk flexed, Leaning posteriorly, Step-through pattern, Decreased stride length General Gait Details: Pt was able to walk with [name] walker with cues and assist for stability as pt was scissoring at times. Cues for safety.  unsteady at times.  Remained on 4 lts to achieve sats > 90% Gait velocity: decreased  Gait velocity interpretation: <1.31 ft/sec,  indicative of household ambulator    ADL: ADL Overall ADL's : Needs assistance/impaired Eating/Feeding: Moderate assistance, Bed level(in chair position) Grooming: Oral care, Sitting, Minimal assistance Grooming Details (indicate cue type and reason): matting noted in hair. Shampoo cap applied and attempted to de-tangle with comb. no success today. RN aware. Pt will need detangler and significant amt of time to work on matting- potentially might need it cut out Upper Body Bathing: Minimal assistance Lower Body Bathing: Maximal assistance Upper Body Dressing : Moderate assistance, Sitting Upper Body Dressing Details (indicate cue type and reason): to doff front opening gown Lower Body Dressing: Maximal assistance Lower Body Dressing Details (indicate cue type and reason): to don socks Toilet Transfer: Minimal assistance, +2 for physical assistance, +2 for safety/equipment, Ambulation(EVA walker) Toilet Transfer Details (indicate cue type and reason): face to face transfer Toileting- Clothing Manipulation and Hygiene: Maximal assistance, Sit to/from stand Toileting - Clothing Manipulation Details (indicate cue type and reason): Pt with colostomy bag at baseline, no balance for perianal care Functional mobility during ADLs: Min guard, Rolling walker General ADL Comments: confusion, agitation about not being able to leave.  Cognition: Cognition Overall Cognitive Status: Impaired/Different from baseline Arousal/Alertness: Awake/alert Orientation Level: Oriented to person, Oriented to place, Oriented to situation, Disoriented to time Attention: Sustained Sustained Attention: Impaired Sustained Attention Impairment: Functional basic, Verbal basic Memory: Impaired Memory Impairment: Storage deficit, Retrieval deficit, Decreased recall of new information Awareness: Impaired Awareness Impairment: Intellectual impairment, Emergent impairment, Anticipatory impairment Safety/Judgment: Impaired  Cognition Arousal/Alertness: Awake/alert Behavior During Therapy: WFL for tasks assessed/performed Overall Cognitive Status: Impaired/Different from baseline Area of Impairment: Memory, Safety/judgement, Problem solving, Attention Orientation Level: Disoriented to, Situation, Time, Place Current Attention Level: Sustained Memory: Decreased short-term memory, Decreased recall of precautions Following Commands: Follows one step commands with increased time Safety/Judgement: Decreased awareness of safety, Decreased awareness of deficits Awareness: Intellectual Problem Solving: Slow processing, Decreased initiation, Difficulty sequencing, Requires verbal cues, Requires tactile cues General Comments: pt with difficulty recalling during conversation   Blood pressure (!) 158/91, pulse 75, temperature 99.3 F (37.4 C), resp. rate 17, height 5\' 1"  (1.549 m), weight 86.5 kg, SpO2 98 %. Physical Exam  Nursing note and vitals reviewed. Constitutional: She appears distressed. Nasal cannula in place.  Disoriented.   HENT:  Head: Normocephalic and atraumatic.  Eyes: Pupils are equal, round, and reactive to light.  Neck: No tracheal deviation present.  Cardiovascular: Normal rate. Exam reveals no friction rub.  No murmur heard. Respiratory: Effort normal. No stridor. No respiratory distress. She has no wheezes.  GI: Soft. She exhibits no distension. There is no abdominal tenderness.  Musculoskeletal: Normal range of motion.        General: No edema.  Neurological: She is alert.  Oriented to self and place a "hospital" in [place]. Does not recall situation and focused on going home. Does not have awareness of deficits. Distracted. Follows basic commands. Moves all 4's 4- /5. Senses pain in all 4's.   Skin: Skin is warm. She is not diaphoretic.  Psychiatric:  Flat, fairly cooperative    Results for orders placed or performed during the hospital encounter of 05/12/18 (from the past 48 hour(s))   Glucose, capillary     Status: Abnormal   Collection Time: 05/24/18  4:15 PM  Result Value Ref Range   Glucose-Capillary 103 (H) 70 - 99 mg/dL  Glucose, capillary     Status: Abnormal   Collection Time: 05/24/18  8:03 PM  Result Value Ref Range   Glucose-Capillary 124 (H) 70 - 99 mg/dL  Glucose, capillary     Status: None   Collection Time: 05/25/18 12:16 AM  Result Value Ref Range   Glucose-Capillary 89 70 - 99 mg/dL  Glucose, capillary     Status: None   Collection Time: 05/25/18  4:17 AM  Result Value Ref Range   Glucose-Capillary 81 70 - 99 mg/dL  Magnesium     Status: None   Collection Time: 05/25/18  4:26 AM  Result Value Ref Range   Magnesium 2.3 1.7 - 2.4 mg/dL    Comment: Performed at [facility], [place] [address] [place], [place] [address]  CBC     Status: Abnormal   Collection Time: 05/25/18  4:26 AM  Result Value Ref Range   WBC 10.4 4.0 - 10.5 K/uL   RBC 3.34 (L) 3.87 - 5.11 MIL/uL   Hemoglobin 9.8 (L) 12.0 - 15.0 g/dL   HCT 31.7 (L) 36.0 - 46.0 %   MCV 94.9 80.0 - 100.0 fL   MCH 29.3 26.0 - 34.0 pg   MCHC 30.9 30.0 - 36.0 g/dL   RDW 13.4 11.5 - 15.5 %   Platelets 244 150 - 400 K/uL   nRBC 0.0 0.0 - 0.2 %    Comment: Performed at [facility], [place] [address], [place], [place] [address]  Basic metabolic panel     Status: Abnormal   Collection Time: 05/25/18  4:26 AM  Result Value Ref Range   Sodium 138 135 - 145 mmol/L   Potassium 4.1 3.5 - 5.1 mmol/L   Chloride 98 98 - 111 mmol/L   CO2 27 22 - 32 mmol/L   Glucose, Bld 79 70 - 99 mg/dL   BUN 64 (H) 6 - 20 mg/dL   Creatinine, Ser 6.52 (H) 0.44 - 1.00 mg/dL   Calcium 8.9 8.9 - 10.3 mg/dL   GFR calc non Af Amer 7 (L) >60 mL/min   GFR calc Af Amer 8 (L) >60 mL/min   Anion gap 13 5 - 15    Comment: Performed at [facility], [place] [address], [place], [place] [address]  Glucose, capillary     Status: None   Collection Time: 05/25/18  7:54 AM  Result Value Ref Range   Glucose-Capillary 86  70 - 99 mg/dL  Glucose, capillary     Status: Abnormal   Collection Time: 05/25/18  4:38 PM  Result Value Ref Range   Glucose-Capillary 101 (H) 70 - 99 mg/dL  Glucose, capillary     Status: Abnormal   Collection Time: 05/25/18  8:19 PM  Result Value Ref Range   Glucose-Capillary 155 (H) 70 - 99 mg/dL  Glucose,  capillary     Status: Abnormal   Collection Time: 05/26/18 12:25 AM  Result Value Ref Range   Glucose-Capillary 61 (L) 70 - 99 mg/dL  Glucose, capillary     Status: Abnormal   Collection Time: 05/26/18  1:19 AM  Result Value Ref Range   Glucose-Capillary 129 (H) 70 - 99 mg/dL  Glucose, capillary     Status: None   Collection Time: 05/26/18  4:04 AM  Result Value Ref Range   Glucose-Capillary 84 70 - 99 mg/dL  Basic metabolic panel     Status: Abnormal   Collection Time: 05/26/18  4:43 AM  Result Value Ref Range   Sodium 137 135 - 145 mmol/L   Potassium 4.0 3.5 - 5.1 mmol/L   Chloride 97 (L) 98 - 111 mmol/L   CO2 27 22 - 32 mmol/L   Glucose, Bld 91 70 - 99 mg/dL   BUN 53 (H) 6 - 20 mg/dL   Creatinine, Ser 5.64 (H) 0.44 - 1.00 mg/dL   Calcium 8.9 8.9 - 10.3 mg/dL   GFR calc non Af Amer 8 (L) >60 mL/min   GFR calc Af Amer 10 (L) >60 mL/min   Anion gap 13 5 - 15    Comment: Performed at [facility], [place] [address] [place], [place] [address]  Protime-INR     Status: None   Collection Time: 05/26/18  4:43 AM  Result Value Ref Range   Prothrombin Time 13.6 11.4 - 15.2 seconds   INR 1.1 0.8 - 1.2    Comment: (NOTE) INR goal varies based on device and disease states. Performed at [facility], [place] [address] [place], [place] [address]   Glucose, capillary     Status: None   Collection Time: 05/26/18  7:30 AM  Result Value Ref Range   Glucose-Capillary 80 70 - 99 mg/dL  Glucose, capillary     Status: None   Collection Time: 05/26/18 12:37 PM  Result Value Ref Range   Glucose-Capillary 95 70 - 99 mg/dL   Comment 1 Notify RN    No results found.      Medical Problem List and Plan: 1.   Anoxic encephalopath and debility secondary to cardiac arrest from acute on chronic respiratory failure.   -admit to inpatient rehab 2.  Antithrombotics: -DVT/anticoagulation:  Pharmaceutical: Heparin  -antiplatelet therapy: N/A 3. Pain Management: Tylenol prn  4. Mood: LCSW to follow for evaluation and support.   -antipsychotic agents:   N/A  5. Neuropsych: This patient is not capable of making decisions on his own behalf. 6. Skin/Wound Care: routine pressure relief measures.  7. Fluids/Electrolytes/Nutrition: Strict I/O. Renal diet with 1200 cc FR.  Will need to consult RD for education as mentation improves.  8. COPD with acute on chronic respiratory failure: Oxygen down to 80's with activity-currently requiring 4 L per [place].  Encourage pulmonary hygiene/flutter valve use. Continues on IV solumedrol as well as duonebs tid.   -simplify regimen as possible 9. OSA: Now requires BIPAP with oxygen at nights.  10 Spinal stenosis/chronic pain: Used tylenol prn at home.  11. Acute renal failure/HD dependent: HD to continue 12. HTN: Controlled on Norvasc. Monitor BID.  13. Anemia of chronic disease: Stable overall. Continue to monitor  14. Anxiety d/o: On Lexapro with alprazolam tid.        Bary Leriche, PA-C 05/26/2018

## 2018-05-26 NOTE — Progress Notes (Signed)
Patient going to dialysis. Writer gave report to the nurse in HD.

## 2018-05-26 NOTE — Progress Notes (Signed)
Inpatient Rehab Admissions Coordinator:   I spoke with daughter, Aniceto Boss, over the phone.  Aniceto Boss and her father are very hopeful for a rehab admission and are able to provide 24/7 supervision/assist at discharge.  Note pt has been clipped to [facility] for TTS schedule.  I am waiting for authorization from patient's insurance for possible rehab admission tomorrow (Saturday).   Shann Medal, PT, DPT Admissions Coordinator [phone] 05/26/18  2:13 PM

## 2018-05-26 NOTE — Progress Notes (Signed)
Occupational Therapy Treatment Patient Details Name: Misty Green MRN: [id number] DOB: July 02, 1970 Today's Date: 05/26/2018    History of present illness 48 yr old F w/ PMHx COPD, CHF, chronic back pain from spinal stenoses, diverticulosis s/p colostomy presents s/p cardiac arrest.    OT comments  Pt performing ADL functional mobility in room with Rw and stability with minguardA. Pt minA for transfers. Pt performing ADl at sink in standing with elbows propped onto counter and inability to stand upright x2 mins at sink. Pt remains confused and stating that she is going home today. Pt would benefit from continued OT skilled services for ADL, mobility and safety in CIR setting. Pt has progressed. OT to follow acutely.  O2 80s on RA with exertion; 30 secs of pursed lip breathing to increase to >90% on 4L O2.    Follow Up Recommendations  CIR;Supervision - Intermittent(Pt progressing. D/C to be updated.)    Equipment Recommendations       Recommendations for Other Services      Precautions / Restrictions Precautions Precautions: Fall Precaution Comments: on 3L 02 at baseline Restrictions Weight Bearing Restrictions: No       Mobility Bed Mobility Overal bed mobility: Needs Assistance Bed Mobility: Sit to Supine           General bed mobility comments: no physical assist needed  Transfers Overall transfer level: Needs assistance Equipment used: Rolling walker (2 wheeled) Transfers: Sit to/from [facility] Sit to Stand: Min assist Stand pivot transfers: Min assist       General transfer comment: sit to stand with assist for stability.    Balance Overall balance assessment: Needs assistance   Sitting balance-Leahy Scale: Fair     Standing balance support: Single extremity supported Standing balance-Leahy Scale: Poor Standing balance comment: in static standing                           ADL either performed or assessed with clinical  judgement   ADL Overall ADL's : Needs assistance/impaired                                     Functional mobility during ADLs: Min guard;Rolling walker General ADL Comments: confusion, agitation about not being able to leave.     Vision   Vision Assessment?: No apparent visual deficits   Perception     Praxis      Cognition Arousal/Alertness: Awake/alert Behavior During Therapy: WFL for tasks assessed/performed Overall Cognitive Status: Impaired/Different from baseline Area of Impairment: Memory;Safety/judgement;Problem solving;Attention                 Orientation Level: Disoriented to;Situation;Time;Place Current Attention Level: Sustained Memory: Decreased short-term memory;Decreased recall of precautions Following Commands: Follows one step commands with increased time Safety/Judgement: Decreased awareness of safety;Decreased awareness of deficits     General Comments: pt with difficulty recalling during conversation        Exercises     Shoulder Instructions       General Comments Pt stating "they told me that I am going home today." Per RN, pt has been stating for this. Pt following commands, but remains confused. about situation    Pertinent Vitals/ Pain       Pain Assessment: Faces Faces Pain Scale: Hurts little more Pain Location: back, sternum from compressions Pain Descriptors / Indicators: Grimacing;Guarding;Sore Pain Intervention(s): Repositioned;Limited  activity within patient's tolerance  Home Living                                          Prior Functioning/Environment              Frequency  Min 3X/week        Progress Toward Goals  OT Goals(current goals can now be found in the care plan section)  Progress towards OT goals: Progressing toward goals  Acute Rehab OT Goals Patient Stated Goal: to go home OT Goal Formulation: With patient Time For Goal Achievement: 06/02/18 Potential to  Achieve Goals: Good ADL Goals Pt Will Perform Grooming: with supervision;standing Pt Will Perform Upper Body Dressing: with modified independence;sitting Pt Will Perform Lower Body Dressing: with modified independence;sit to/from stand Pt Will Transfer to Toilet: with modified independence;ambulating Pt Will Perform Toileting - Clothing Manipulation and hygiene: with modified independence;sit to/from stand Additional ADL Goal #1: Pt will perform bed mobility prior to engaging in ADL at mod I level  Plan Discharge plan remains appropriate    Co-evaluation                 AM-PAC OT "6 Clicks" Daily Activity     Outcome Measure   Help from another person eating meals?: None Help from another person taking care of personal grooming?: None Help from another person toileting, which includes using toliet, bedpan, or urinal?: A Little Help from another person bathing (including washing, rinsing, drying)?: A Lot Help from another person to put on and taking off regular upper body clothing?: A Little Help from another person to put on and taking off regular lower body clothing?: A Lot 6 Click Score: 18    End of Session Equipment Utilized During Treatment: Gait belt;Oxygen  OT Visit Diagnosis: Unsteadiness on feet (R26.81);Other abnormalities of gait and mobility (R26.89);Muscle weakness (generalized) (M62.81);Other symptoms and signs involving cognitive function   Activity Tolerance Patient tolerated treatment well   Patient Left in chair;with chair alarm set;with call bell/phone within reach   Nurse Communication Mobility status        Time: 1202-1220 OT Time Calculation (min): 18 min  Charges: OT General Charges $OT Visit: 1 Visit OT Treatments $Self Care/Home Management : 8-22 mins  Darryl Nestle) Marsa Aris OTR/L Acute Rehabilitation Services Pager: [phone] Office: [phone]   Misty Green 05/26/2018, 1:14 PM

## 2018-05-26 NOTE — Progress Notes (Signed)
SLP Cancellation Note  Patient Details Name: [name] MRN: [id number] DOB: Jul 25, 1970   Cancelled treatment:       Reason Eval/Treat Not Completed: Other (comment) NPO for procedure   Cozette Braggs, Katherene Ponto 05/26/2018, 1:41 PM

## 2018-05-26 NOTE — Progress Notes (Signed)
PT Cancellation Note  Patient Details Name: Misty Green MRN: [id number] DOB: 1970/11/20   Cancelled Treatment:    Reason Eval/Treat Not Completed: Patient at procedure or test/unavailable. Returned to pt's room. She is off the floor for a procedure and then going straight to dialysis afterwards. Will follow up later as time allows vs another date.   Willow Ora, PTA, CLT Acute Rehab Services [phone] 05/26/18, 2:29 PM   Willow Ora 05/26/2018, 2:28 PM

## 2018-05-26 NOTE — Consult Note (Signed)
Chief Complaint: Patient was seen in consultation today for tunneled dialysis catheter placement Chief Complaint  Patient presents with   Cardiac Arrest   at the request of Dr Carolin Sicks   Supervising Physician: Daryll Brod  Patient Status: [facility] - In-pt  History of Present Illness: Misty Green is a 48 y.o. female   Resident placed temp cath Rt IJ  05/16/18  AKI - likely ischemic ATN sub nephrotic proteinuria Pt with continued renal failure; worsening Cr level No renal recovery  Slow flow catheter Need for long term use Request for tunneled catheter placement  Past Medical History:  Diagnosis Date   CHF (congestive heart failure) ([place])    COPD (chronic obstructive pulmonary disease) ([place])    Current smoker    Opiate abuse, continuous ([place])     Past Surgical History:  Procedure Laterality Date   ABDOMINAL HYSTERECTOMY     BACK SURGERY     cesction     COLOSTOMY  01/13/2018   Procedure: COLOSTOMY Creation;  Surgeon: Jules Husbands, MD;  Location: [facility] ORS;  Service: General;;   INCISIONAL HERNIA REPAIR     lower midline laparotomy incision (hysterectomy), repaired with mesh   LAPAROSCOPIC CHOLECYSTECTOMY  2012   LAPAROSCOPIC LYSIS OF ADHESIONS  01/13/2018   Procedure: LAPAROSCOPIC LYSIS OF ADHESIONS;  Surgeon: Jules Husbands, MD;  Location: [facility] ORS;  Service: General;;   LAPAROSCOPIC SIGMOID COLECTOMY  01/13/2018   Procedure: LAPAROSCOPIC SIGMOID COLECTOMY;  Surgeon: Jules Husbands, MD;  Location: [facility] ORS;  Service: General;;   ORIF WRIST FRACTURE Right 08/25/2015   Procedure: OPEN REDUCTION INTERNAL FIXATION (ORIF) WRIST FRACTURE;  Surgeon: Corky Mull, MD;  Location: [facility] ORS;  Service: Orthopedics;  Laterality: Right;    Allergies: Other  Medications: Prior to Admission medications   Medication Sig Start Date End Date Taking? Authorizing Provider  acetaminophen (TYLENOL) 500 MG tablet Take 500 mg by mouth every 6 (six) hours as  needed for mild pain or fever.   Yes [provider]  albuterol (PROVENTIL HFA;VENTOLIN HFA) 108 (90 Base) MCG/ACT inhaler Inhale into the lungs every 6 (six) hours as needed for wheezing or shortness of breath.   Yes [provider]  ALPRAZolam Duanne Moron) 1 MG tablet Take 1 mg by mouth 4 (four) times daily.   Yes [provider]  Ascorbic Acid (VITAMIN C) 1000 MG tablet Take 1,000 mg by mouth daily.   Yes [provider]  diphenhydrAMINE (BENADRYL) 50 MG capsule Take 50 mg by mouth at bedtime as needed for sleep.   Yes [provider]  escitalopram (LEXAPRO) 10 MG tablet Take 10 mg by mouth daily.   Yes [provider]  Fluticasone-Salmeterol (ADVAIR) 250-50 MCG/DOSE AEPB Inhale 1 puff into the lungs 2 (two) times daily.   Yes [provider]  furosemide (LASIX) 40 MG tablet Take 40 mg by mouth daily as needed for fluid.    Yes [provider]  Multiple Vitamins-Minerals (MULTIVITAMIN WITH MINERALS) tablet Take 1 tablet by mouth daily.   Yes [provider]  pantoprazole (PROTONIX) 40 MG tablet Take 40 mg by mouth daily.   Yes [provider]  roflumilast (DALIRESP) 500 MCG TABS tablet Take 500 mcg by mouth daily.   Yes [provider]  tiotropium (SPIRIVA) 18 MCG inhalation capsule Place 18 mcg into inhaler and inhale daily.   Yes [provider]  cyclobenzaprine (FLEXERIL) 5 MG tablet Take 1 tablet (5 mg total) by mouth 3 (three)  times daily. Patient not taking: Reported on 05/13/2018 01/30/18   Caroleen Hamman F, MD  docusate sodium (COLACE) 100 MG capsule Take 100 mg by mouth 2 (two) times daily. 01/09/18   [provider]  gabapentin (NEURONTIN) 300 MG capsule Take 800 mg by mouth 3 (three) times daily.     [provider]  metaxalone (SKELAXIN) 400 MG tablet Take 1 tablet (400 mg total) by mouth every 8 (eight) hours as needed for muscle spasms. Patient not taking: Reported on  05/13/2018 01/18/18   Hillary Bow, MD  ondansetron (ZOFRAN ODT) 4 MG disintegrating tablet Take 1 tablet (4 mg total) by mouth every 8 (eight) hours as needed for nausea or vomiting. Patient not taking: Reported on 05/13/2018 01/30/18   Jules Husbands, MD  ondansetron (ZOFRAN) 8 MG tablet Take 1 tablet (8 mg total) by mouth every 8 (eight) hours as needed for nausea or vomiting. 02/15/18   Pabon, Diego F, MD  zolpidem (AMBIEN) 10 MG tablet Take 10 mg by mouth at bedtime as needed for sleep.    [provider]     Family History  Problem Relation Age of Onset   COPD Mother    Hypertension Father    Diabetes Father     Social History   Socioeconomic History   Marital status: Married    Spouse name: Not on file   Number of children: Not on file   Years of education: Not on file   Highest education level: Not on file  Occupational History   Not on file  Social Needs   Financial resource strain: Not on file   Food insecurity:    Worry: Not on file    Inability: Not on file   Transportation needs:    Medical: Not on file    Non-medical: Not on file  Tobacco Use   Smoking status: Former Smoker    Packs/day: 1.00    Years: 15.00    Pack years: 15.00    Types: Cigarettes   Smokeless tobacco: Never Used  Substance and Sexual Activity   Alcohol use: No   Drug use: Yes    Types: Marijuana    Comment: Yesterday   Sexual activity: Not on file  Lifestyle   Physical activity:    Days per week: Not on file    Minutes per session: Not on file   Stress: Not on file  Relationships   Social connections:    Talks on phone: Not on file    Gets together: Not on file    Attends religious service: Not on file    Active member of club or organization: Not on file    Attends meetings of clubs or organizations: Not on file    Relationship status: Not on file  Other Topics Concern   Not on file  Social History Narrative   Not on file    Review of Systems:  A 12 point ROS discussed and pertinent positives are indicated in the HPI above.  All other systems are negative.  Review of Systems  Constitutional: Positive for activity change and fatigue. Negative for fever.  Respiratory: Negative for cough and shortness of breath.   Cardiovascular: Negative for chest pain.  Gastrointestinal: Negative for abdominal pain.  Musculoskeletal: Negative for back pain.  Neurological: Positive for weakness.  Psychiatric/Behavioral: Negative for behavioral problems and confusion.    Vital Signs: BP (!) 158/91 (BP Location: Left Arm)    Pulse 75    Temp  98.5 F (36.9 C) (Oral)    Resp 17    Ht 5\' 1"  (1.549 m)    Wt 190 lb 11.2 oz (86.5 kg)    SpO2 98%    BMI 36.03 kg/m   Physical Exam Vitals signs reviewed.  Cardiovascular:     Rate and Rhythm: Normal rate and regular rhythm.     Heart sounds: Normal heart sounds.  Pulmonary:     Breath sounds: Normal breath sounds.  Abdominal:     General: Bowel sounds are normal.  Musculoskeletal: Normal range of motion.     Comments: Rt IJ temp cath  Skin:    General: Skin is warm and dry.  Neurological:     Mental Status: She is alert and oriented to person, place, and time.  Psychiatric:        Mood and Affect: Mood normal.        Behavior: Behavior normal.        Thought Content: Thought content normal.        Judgment: Judgment normal.     Imaging: Ct Head Wo Contrast  Result Date: 05/13/2018 CLINICAL DATA:  Altered level of consciousness.  Post CPR. EXAM: CT HEAD WITHOUT CONTRAST TECHNIQUE: Contiguous axial images were obtained from the base of the skull through the vertex without intravenous contrast. COMPARISON:  None. FINDINGS: Brain: No acute intracranial abnormality. Specifically, no hemorrhage, hydrocephalus, mass lesion, acute infarction, or significant intracranial injury. Vascular: No hyperdense vessel or unexpected calcification. Skull: No acute calvarial abnormality. Sinuses/Orbits: Mucosal  thickening throughout the ethmoid air cells. Air-fluid level in the right maxillary sinus. Mastoid air cells clear. Other: None IMPRESSION: No intracranial abnormality. Electronically Signed   By: Rolm Baptise M.D.   On: 05/13/2018 01:08   Mr Brain Wo Contrast  Result Date: 05/21/2018 CLINICAL DATA:  Concern for anoxic injury. EXAM: MRI HEAD WITHOUT CONTRAST TECHNIQUE: Multiplanar, multiecho pulse sequences of the brain and surrounding structures were obtained without intravenous contrast. COMPARISON:  05/14/2018 FINDINGS: Brain: No infarct is seen. On FLAIR imaging and a few T2 weighted slices there is question of cortical edema, but no clear swelling/mass effect and no change in sulci appearance since admission head CT. Some of this FLAIR signal may be related to motion and oxygenation. No hydrocephalus, mass, collection, or blood products. Vascular: Major flow voids are preserved. Skull and upper cervical spine: Negative for marrow lesion Sinuses/Orbits: Opacified sphenoid sinuses and right maxillary fluid level. Patchy bilateral mastoid opacification. IMPRESSION: 1. Negative for infarct or definitive global anoxic injury. 2. Per chart a temporal lobe lesion is questioned but again not visualized. Contrast was not administered in the setting of profound acute renal failure. 3. Sinusitis. Electronically Signed   By: Monte Fantasia M.D.   On: 05/21/2018 15:28   Mr Brain Wo Contrast  Result Date: 05/14/2018 CLINICAL DATA:  Anoxic brain damage? Patient suffered cardiac arrest. EXAM: MRI HEAD WITHOUT CONTRAST TECHNIQUE: Multiplanar, multiecho pulse sequences of the brain and surrounding structures were obtained without intravenous contrast. COMPARISON:  CT head 05/13/2018. FINDINGS: Brain: No acute infarction, hemorrhage, hydrocephalus, extra-axial collection or mass lesion. Normal for age cerebral volume. No significant white matter disease. Symmetric FLAIR hyperintense subarachnoid spaces near the vertex  related to hyperoxygenation from mechanical ventilation. Vascular: Normal flow voids. Skull and upper cervical spine: Normal marrow signal. Partial empty sella. Sinuses/Orbits: Diffuse sinus opacity, most notable in the RIGHT maxillary region, likely related to recumbency. Negative orbits. Other: BILATERAL mastoid effusions. IMPRESSION: MRI of the brain is  negative for anoxic injury or acute infarction/hemorrhage. No evidence of increased intracranial pressure. No focal abnormalities are detected. Electronically Signed   By: Staci Righter M.D.   On: 05/14/2018 11:53   US Renal  Result Date: 05/15/2018 CLINICAL DATA:  Acute renal failure EXAM: RENAL / URINARY TRACT ULTRASOUND COMPLETE COMPARISON:  None. FINDINGS: Right Kidney: Renal measurements: 12.9 x 4.7 x 4.8 cm = volume: 151 mL . Echogenicity within normal limits. No mass or hydronephrosis visualized. Left Kidney: Renal measurements: 14 x 5.7 x 5.6 cm = volume: 231 mL. Echogenicity within normal limits. No mass or hydronephrosis visualized. Bladder: Decompressed with a Foley catheter present. IMPRESSION: Normal renal ultrasound. Electronically Signed   By: Kathreen Devoid   On: 05/15/2018 10:04   Dg Chest Port 1 View  Result Date: 05/24/2018 CLINICAL DATA:  Hypoxia   Patient a little AMS EXAM: PORTABLE CHEST - 1 VIEW COMPARISON:  05/18/2018 FINDINGS: Patient has been extubated, and the gastric tube removed. Right IJ dialysis catheter stable. Left infrahilar interstitial opacity stable. Improvement in mild edema or infiltrates. Stable calcified upper lobe granulomas Heart size upper limits normal for technique. No effusion. No pneumothorax. Visualized bones unremarkable. IMPRESSION: Extubation with improvement in bilateral edema/infiltrates. Electronically Signed   By: Lucrezia Europe M.D.   On: 05/24/2018 08:37   Dg Chest Port 1 View  Result Date: 05/18/2018 CLINICAL DATA:  Respiratory failure. EXAM: PORTABLE CHEST 1 VIEW COMPARISON:  Radiograph of May 16, 2018. FINDINGS: The heart size and mediastinal contours are within normal limits. Endotracheal and nasogastric tubes are unchanged in position. Right internal jugular catheter is unchanged in position. No pneumothorax is noted. Stable coarse interstitial densities are noted throughout both lungs. Stable left basilar subsegmental atelectasis. The visualized skeletal structures are unremarkable. IMPRESSION: Stable support apparatus. Stable mild left basilar subsegmental atelectasis. Electronically Signed   By: Marijo Conception M.D.   On: 05/18/2018 07:20   Dg Chest Port 1 View  Result Date: 05/16/2018 CLINICAL DATA:  Central line placement. EXAM: PORTABLE CHEST 1 VIEW COMPARISON:  Chest x-ray from same day at 5:09 a.m. FINDINGS: Interval placement of a right internal jugular central venous catheter with the tip in the distal SVC. Unchanged endotracheal and enteric tubes. The heart size and mediastinal contours are within normal limits. Normal pulmonary vascularity. Chronically coarsened interstitial markings are similar to prior studies and likely smoking-related. Unchanged mild left basilar atelectasis. No focal consolidation, pleural effusion, or pneumothorax. No acute osseous abnormality. IMPRESSION: 1. New right internal jugular central venous catheter with tip in the distal SVC. No pneumothorax. Electronically Signed   By: Titus Dubin M.D.   On: 05/16/2018 10:57   Dg Chest Port 1 View  Result Date: 05/16/2018 CLINICAL DATA:  Respiratory failure. EXAM: PORTABLE CHEST 1 VIEW COMPARISON:  Radiograph of May 15, 2018. FINDINGS: Stable cardiomediastinal silhouette. Endotracheal and nasogastric tubes are unchanged in position. No pneumothorax or pleural effusion is noted. Lungs are clear. Bony thorax is unremarkable. IMPRESSION: Stable support apparatus. No acute cardiopulmonary abnormality seen. Electronically Signed   By: Marijo Conception M.D.   On: 05/16/2018 07:05   Dg Chest Port 1 View  Result Date:  05/15/2018 CLINICAL DATA:  Respiratory failure. EXAM: PORTABLE CHEST 1 VIEW COMPARISON:  Radiograph of May 14, 2018. FINDINGS: Endotracheal and nasogastric tubes are unchanged in position. No pneumothorax is noted. Stable bilateral calcified granulomas are noted. Stable bibasilar opacities are noted concerning for subsegmental atelectasis or possibly infiltrates. Bony thorax is unremarkable. IMPRESSION: Stable  support apparatus. Stable bibasilar opacities as described above. Electronically Signed   By: Marijo Conception M.D.   On: 05/15/2018 07:41   Dg Chest Port 1 View  Result Date: 05/14/2018 CLINICAL DATA:  Respiratory failure. EXAM: PORTABLE CHEST 1 VIEW COMPARISON:  May 13, 2018 FINDINGS: The ETT is in good position. The NG tube terminates below today's film. No pneumothorax. Mild opacity in the bases. No other acute abnormalities. IMPRESSION: 1. Support apparatus as above. 2. Mild opacity in the bases may represent atelectasis. Early infiltrates not excluded. Recommend attention on follow-up. Electronically Signed   By: Dorise Bullion III M.D   On: 05/14/2018 08:41   Dg Chest Port 1 View  Result Date: 05/13/2018 CLINICAL DATA:  Ventilator dependent respiratory failure. EXAM: PORTABLE CHEST 1 VIEW COMPARISON:  05/12/2018, 01/08/2018 and earlier. FINDINGS: Endotracheal tube tip in satisfactory position projecting approximately 6 mm above the carina. Gastric tube courses below the diaphragm into the stomach. External pacing pads. Cardiac silhouette normal in size, unchanged. Stable mild pulmonary venous hypertension. Stable chronic coarse interstitial lung disease and numerous calcified granulomata throughout both lungs. No new pulmonary parenchymal abnormalities. IMPRESSION: 1. Support apparatus satisfactory. 2. Stable chronic interstitial lung disease and numerous calcified granulomata throughout both lungs. 3. No new/acute abnormalities. Electronically Signed   By: Evangeline Dakin M.D.   On:  05/13/2018 13:12   Dg Chest Port 1 View  Result Date: 05/12/2018 CLINICAL DATA:  Respiratory failure EXAM: PORTABLE CHEST 1 VIEW COMPARISON:  01/08/2018, 03/21/2016 FINDINGS: Endotracheal tube tip is about 29 mm superior to the carina. Esophageal tube tip below the diaphragm but is non included. Borderline cardiomegaly. Chronic interstitial opacity at the bases. No acute consolidation. Aortic atherosclerosis. No pneumothorax. Calcified nodules in the bilateral lung IMPRESSION: 1. Endotracheal tube tip about 29 mm superior to carina. Esophageal tube tip below the diaphragm but incompletely included 2. Hyperinflated lungs with chronic interstitial opacity at the bases. No acute airspace disease Electronically Signed   By: Donavan Foil M.D.   On: 05/12/2018 22:29    Labs:  CBC: Recent Labs    05/22/18 0337 05/23/18 0437 05/24/18 0444 05/25/18 0426  WBC 12.7* 11.4* 9.7 10.4  HGB 10.0* 10.3* 9.7* 9.8*  HCT 31.2* 33.0* 30.7* 31.7*  PLT 180 214 212 244    COAGS: Recent Labs    05/26/18 0443  INR 1.1    BMP: Recent Labs    05/23/18 0437 05/24/18 0444 05/25/18 0426 05/26/18 0443  NA 137 137 138 137  K 4.0 3.9 4.1 4.0  CL 97* 93* 98 97*  CO2 25 27 27 27   GLUCOSE 80 93 79 91  BUN 37* 53* 64* 53*  CALCIUM 9.0 8.9 8.9 8.9  CREATININE 4.16* 5.48* 6.52* 5.64*  GFRNONAA 12* 9* 7* 8*  GFRAA 14* 10* 8* 10*    LIVER FUNCTION TESTS: Recent Labs    05/17/18 0538 05/18/18 0314 05/19/18 0226 05/21/18 0430  BILITOT 0.4 0.5 0.8 0.7  AST 178* 89* 64* 55*  ALT 1,461* 1,017* 748* 435*  ALKPHOS 100 88 81 68  PROT 5.9* 5.7* 5.6* 5.8*  ALBUMIN 2.4* 2.3* 2.5*   2.4* 2.7*    TUMOR MARKERS: No results for input(s): AFPTM, CEA, CA199, CHROMGRNA in the last 8760 hours.  Assessment and Plan:  AKI - likely ischemic ATN sub nephrotic proteinuria Pt with continued renal failure; worsening Cr level No renal recovery Temp cath placed 4/28 Scheduled for tunneled dialysis  catheter Risks and benefits discussed with the patient  including, but not limited to bleeding, infection, vascular injury, pneumothorax which may require chest tube placement, air embolism or even death  All of the patient's questions were answered, patient is agreeable to proceed. Consent signed and in chart.   Thank you for this interesting consult.  I greatly enjoyed meeting MADOLINE BHATT and look forward to participating in their care.  A copy of this report was sent to the requesting provider on this date.  Electronically Signed: Lavonia Drafts, PA-C 05/26/2018, 2:33 PM   I spent a total of 20 Minutes    in face to face in clinical consultation, greater than 50% of which was counseling/coordinating care for tunneled dialysis catheter placement

## 2018-05-26 NOTE — Progress Notes (Signed)
PT Cancellation Note  Patient Details Name: Misty Green MRN: [id number] DOB: 06-17-1970   Cancelled Treatment:    Reason Eval/Treat Not Completed: Fatigue/lethargy limiting ability to participate. Pt reports she just finished working with therapy (OT) and is exhausted at this time. Agreed to PT follow up later today (after lunch). Acute PT to follow up later in day.   Willow Ora, PTA, CLT Acute Rehab Services Office813 [phone] 05/26/18, 12:37 PM   Willow Ora 05/26/2018, 12:36 PM

## 2018-05-26 NOTE — Progress Notes (Signed)
PROGRESS NOTE    Misty Green  GDJ:[id number] DOB: 03-20-1970 DOA: 05/12/2018 PCP: Sinda Du, MD      Brief Narrative:  Misty Green is a 48 y.o. F with COPD not on home O2, diverticulitis with hx colectomy and colostomy, dCHF, and OSA who presented with cardiac arrest.     Assessment & Plan:  Acute on chronic respiratory failure with hypoxia and hypercapnia Presumed aspiration pneumonia/H. influenzae pneumonia OSA on CPAP at home, only nocturnal O2 at home COPD exacerbation Intub 4/24 >> 4/28 Blood cultures negative x4.  RVP negative. SARS-CoV-2 testing was obtained for screening purposes.  COVID ruled out.  Tracheal aspirate with scant H flu.  Completed 5 days Cefepime.  -Wean O2 as able -Continue prednisone -Continue scheduled Duoneb -Hold home Spiriva, Advair and Daliresp -Continue pulmonary toilet    Cardiac arrest Initial rhythm unknown, presumed PEA arrest from respiratory failure CPR ~15 minutes before ROSC, per report.  Initial rhythm unknown.  Echocardiogram with preserved EF.  Acute renal failure UOP >500 cc yesterday.  Has RIJ catheter.  Started HD on 4/28.  Getting dialysis today.  In process of CLIP for AKI. -Strict I/Os -Daily Renal panel -Consult Nephrology, appreciate cares  Acute metabolic encephalopathy MRI brain not c/w anoxic injury.  Septic shock vs cardiogenic shock Blood cultures negative.  Treated with 5 days cefepime for pneumonia.  Now BP elevated.  Hypertension -Start amlodipine 5 mg  Transaminitis Likely shock liver, resolved.  History of diverticulitis s/p Hartmann's with colostomy   Anxiety -Continue Lexapro -Plan to taper alprazolam -Trazodone as needed for sleep  Anemia of chronic disease No clinical bleeding, Hgb stable         MDM and disposition: The below labs and imaging reports reviewed and summarized above.  Medication management as above.  The patient was admitted with cardiac arrest.  She  has persistent acute metabolic encephalopathy, persistent hypoxic respiratory failure and persistent renal failure requiring dialysis.     Now requires significant occupational and speech therapies as a result of cognitive impairment resulting from her cardiac arrest.  We will provide aggressive rehab in CIR, following which we expect her to succeed at home.      DVT prophylaxis: Heparin Code Status: FULL Family Communication: daughter by phone    Consultants:   PCCM  Nephrology  Procedures:   Intubation 4/24  Central line placement 4/24  Extubation 4/28   Antimicrobials:   Cefepime    Subjective: Still complains of sternal pain from her CPR.  No new fever, cough, confusion, respiratory distress.  Objective: Vitals:   05/26/18 0549 05/26/18 0836 05/26/18 1334 05/26/18 1504  BP: (!) 158/91   (!) 158/85  Pulse: 75   81  Resp: 17   17  Temp: 99.3 F (37.4 C)  98.5 F (36.9 C)   TempSrc:   Oral   SpO2: 100% 98%  94%  Weight:      Height:        Intake/Output Summary (Last 24 hours) at 05/26/2018 1524 Last data filed at 05/26/2018 0930 Gross per 24 hour  Intake 60 ml  Output 19 ml  Net 41 ml   Filed Weights   05/25/18 0500 05/25/18 1207 05/25/18 1550  Weight: 86.2 kg 86.2 kg 86.5 kg    Examination: General appearance: B's adult female, sitting in a chair, no acute distress, interactive. HEENT: Anicteric, conjunctival pink, lids and lashes normal.  No nasal deformity, discharge, or epistaxis.  Nasal cannula in place, lips moist, dentition  normal, oropharynx moist, no oral lesions. Skin: Skin warm and dry without suspicious rashes or lesions. Cardiac: Heart rate regular, rhythm normal, no murmurs, no lower extremity edema. Respiratory: Normal respiratory rate and effort, lungs diminished bilaterally, no wheezing. Abdomen: Abdomen soft, no tenderness to palpation, no ascites or distention. MSK: No deformities or effusions of the large joints of the upper or  lower extremities bilaterally, normal muscle bulk and tone. Neuro: Extraocular movements intact, speech fluent, moves upper and lower extremities with normal strength and coordination. Psych: Somewhat confused, attention slightly diminished, affect normal, judgment and insight appear moderately impaired.    Data Reviewed: I have personally reviewed following labs and imaging studies:  CBC: Recent Labs  Lab 05/21/18 0430 05/22/18 0337 05/23/18 0437 05/24/18 0444 05/25/18 0426  WBC 12.2* 12.7* 11.4* 9.7 10.4  HGB 10.0* 10.0* 10.3* 9.7* 9.8*  HCT 31.7* 31.2* 33.0* 30.7* 31.7*  MCV 94.6 94.8 95.4 93.6 94.9  PLT 158 180 214 212 244   Basic Metabolic Panel: Recent Labs  Lab 05/21/18 0430 05/22/18 0337 05/23/18 0437 05/24/18 0444 05/25/18 0426 05/26/18 0443  NA 139 138 137 137 138 137  K 4.1 5.1 4.0 3.9 4.1 4.0  CL 96* 99 97* 93* 98 97*  CO2 28 26 25 27 27 27   GLUCOSE 75 73 80 93 79 91  BUN 45* 65* 37* 53* 64* 53*  CREATININE 3.79* 5.19* 4.16* 5.48* 6.52* 5.64*  CALCIUM 8.8* 8.2* 9.0 8.9 8.9 8.9  MG 2.1 2.2 2.1 2.2 2.3  --    GFR: Estimated Creatinine Clearance: 12.2 mL/min (A) (by C-G formula based on SCr of 5.64 mg/dL (H)). Liver Function Tests: Recent Labs  Lab 05/21/18 0430  AST 55*  ALT 435*  ALKPHOS 68  BILITOT 0.7  PROT 5.8*  ALBUMIN 2.7*   No results for input(s): LIPASE, AMYLASE in the last 168 hours. Recent Labs  Lab 05/20/18 1231  AMMONIA 35   Coagulation Profile: Recent Labs  Lab 05/26/18 0443  INR 1.1   Cardiac Enzymes: No results for input(s): CKTOTAL, CKMB, CKMBINDEX, TROPONINI in the last 168 hours. BNP (last 3 results) No results for input(s): PROBNP in the last 8760 hours. HbA1C: No results for input(s): HGBA1C in the last 72 hours. CBG: Recent Labs  Lab 05/26/18 0025 05/26/18 0119 05/26/18 0404 05/26/18 0730 05/26/18 1237  GLUCAP 61* 129* 84 80 95   Lipid Profile: No results for input(s): CHOL, HDL, LDLCALC, TRIG, CHOLHDL,  LDLDIRECT in the last 72 hours. Thyroid Function Tests: No results for input(s): TSH, T4TOTAL, FREET4, T3FREE, THYROIDAB in the last 72 hours. Anemia Panel: No results for input(s): VITAMINB12, FOLATE, FERRITIN, TIBC, IRON, RETICCTPCT in the last 72 hours. Urine analysis:    Component Value Date/Time   COLORURINE YELLOW 05/21/2018 0901   APPEARANCEUR HAZY (A) 05/21/2018 0901   LABSPEC 1.009 05/21/2018 0901   PHURINE 6.0 05/21/2018 0901   GLUCOSEU NEGATIVE 05/21/2018 0901   HGBUR SMALL (A) 05/21/2018 0901   BILIRUBINUR NEGATIVE 05/21/2018 0901   KETONESUR NEGATIVE 05/21/2018 0901   PROTEINUR 100 (A) 05/21/2018 0901   NITRITE NEGATIVE 05/21/2018 0901   LEUKOCYTESUR NEGATIVE 05/21/2018 0901   Sepsis Labs: @LABRCNTIP (procalcitonin:4,lacticacidven:4)  ) Recent Results (from the past 240 hour(s))  Culture, blood (routine x 2)     Status: None   Collection Time: 05/21/18 11:25 AM  Result Value Ref Range Status   Specimen Description BLOOD LEFT HAND  Final   Special Requests   Final    BOTTLES DRAWN AEROBIC ONLY  Blood Culture adequate volume   Culture   Final    NO GROWTH 5 DAYS Performed at [facility], [place] [address] [place], [place] [address]    Report Status 05/26/2018 FINAL  Final  Culture, blood (routine x 2)     Status: None   Collection Time: 05/21/18 11:36 AM  Result Value Ref Range Status   Specimen Description BLOOD RIGHT HAND  Final   Special Requests   Final    BOTTLES DRAWN AEROBIC ONLY Blood Culture adequate volume   Culture   Final    NO GROWTH 5 DAYS Performed at [facility], [place] [address] [place], [place] [address]    Report Status 05/26/2018 FINAL  Final         Radiology Studies: No results found.      Scheduled Meds: . ALPRAZolam  0.25 mg Oral TID  . amLODipine  5 mg Oral Daily  . chlorhexidine      . chlorhexidine gluconate (MEDLINE KIT)  15 mL Mouth Rinse BID  . Chlorhexidine Gluconate Cloth  6 each Topical Daily  .  Chlorhexidine Gluconate Cloth  6 each Topical Q0600  . Chlorhexidine Gluconate Cloth  6 each Topical Q0600  . escitalopram  10 mg Oral Daily  . feeding supplement (NEPRO CARB STEADY)  237 mL Oral TID BM  . fentaNYL      . gelatin adsorbable      . heparin      . [START ON 05/27/2018] heparin  5,000 Units Subcutaneous Q8H  . insulin aspart  0-15 Units Subcutaneous Q4H  . ipratropium-albuterol  3 mL Nebulization TID  . lidocaine      . lidocaine-EPINEPHrine      . mouth rinse  15 mL Mouth Rinse BID  . methylPREDNISolone (SOLU-MEDROL) injection  40 mg Intravenous Daily  . midazolam      . pantoprazole  40 mg Oral Daily  . sodium polystyrene  30 g Rectal Once   Continuous Infusions: .  ceFAZolin (ANCEF) IV 2 g (05/26/18 1459)     LOS: 14 days    Time spent: 25 minutes     Edwin Dada, MD [facility] 05/26/2018, 3:24 PM     Please page through [place]:  [email or web address] If 7PM-7AM, please contact night-coverage

## 2018-05-26 NOTE — Progress Notes (Signed)
[facility]    NEPHROLOGY PROGRESS NOTE  SUBJECTIVE: Patient seen earlier this morning.  Reports being uncomfortable and really wants to go home.  Denies any chest pain, shortness of breath, nausea, vomiting, diarrhea or dysuria.  All other review of systems are negative.    OBJECTIVE:  Vitals:   05/26/18 1535 05/26/18 1539  BP: (!) 148/79 (!) 166/83  Pulse: 79 80  Resp: 16 15  Temp:    SpO2: 93% 93%    Intake/Output Summary (Last 24 hours) at 05/26/2018 1645 Last data filed at 05/26/2018 0930 Gross per 24 hour  Intake 60 ml  Output -  Net 60 ml      General:  AAOx3 NAD HEENT: MMM [place] AT anicteric sclera Neck:  No JVD, no adenopathy CV:  Heart RRR  Lungs:  L/S CTA bilaterally Abd:  abd SNT/ND with normal BS GU:  Bladder non-palpable Extremities: Trace bilateral lower extremity edema. Skin:  No skin rash  MEDICATIONS:  . ALPRAZolam  0.25 mg Oral TID  . amLODipine  5 mg Oral Daily  . chlorhexidine      . chlorhexidine gluconate (MEDLINE KIT)  15 mL Mouth Rinse BID  . Chlorhexidine Gluconate Cloth  6 each Topical Daily  . Chlorhexidine Gluconate Cloth  6 each Topical Q0600  . Chlorhexidine Gluconate Cloth  6 each Topical Q0600  . escitalopram  10 mg Oral Daily  . feeding supplement (NEPRO CARB STEADY)  237 mL Oral TID BM  . fentaNYL      . gelatin adsorbable      . heparin      . [START ON 05/27/2018] heparin  5,000 Units Subcutaneous Q8H  . insulin aspart  0-15 Units Subcutaneous Q4H  . ipratropium-albuterol  3 mL Nebulization TID  . lidocaine      . lidocaine-EPINEPHrine      . mouth rinse  15 mL Mouth Rinse BID  . methylPREDNISolone (SOLU-MEDROL) injection  40 mg Intravenous Daily  . midazolam      . pantoprazole  40 mg Oral Daily  . sodium polystyrene  30 g Rectal Once       LABS:   CBC Latest Ref Rng & Units 05/25/2018 05/24/2018 05/23/2018  WBC 4.0 - 10.5 K/uL 10.4 9.7 11.4(H)  Hemoglobin 12.0 - 15.0 g/dL 9.8(L) 9.7(L) 10.3(L)  Hematocrit  36.0 - 46.0 % 31.7(L) 30.7(L) 33.0(L)  Platelets 150 - 400 K/uL 244 212 214    CMP Latest Ref Rng & Units 05/26/2018 05/25/2018 05/24/2018  Glucose 70 - 99 mg/dL 91 79 93  BUN 6 - 20 mg/dL 53(H) 64(H) 53(H)  Creatinine 0.44 - 1.00 mg/dL 5.64(H) 6.52(H) 5.48(H)  Sodium 135 - 145 mmol/L 137 138 137  Potassium 3.5 - 5.1 mmol/L 4.0 4.1 3.9  Chloride 98 - 111 mmol/L 97(L) 98 93(L)  CO2 22 - 32 mmol/L _0 Calcium 8.9 - 10.3 mg/dL 8.9 8.9 8.9  Total Protein 6.5 - 8.1 g/dL - - -  Total Bilirubin 0.3 - 1.2 mg/dL - - -  Alkaline Phos 38 - 126 U/L - - -  AST 15 - 41 U/L - - -  ALT 0 - 44 U/L - - -    Lab Results  Component Value Date   CALCIUM 8.9 05/26/2018   CAION 1.16 05/19/2018   PHOS 6.8 (H) 05/19/2018       Component Value Date/Time   COLORURINE YELLOW 05/21/2018 0901   APPEARANCEUR HAZY (A) 05/21/2018 0901   LABSPEC 1.009  05/21/2018 0901   PHURINE 6.0 05/21/2018 0901   GLUCOSEU NEGATIVE 05/21/2018 0901   HGBUR SMALL (A) 05/21/2018 0901   BILIRUBINUR NEGATIVE 05/21/2018 0901   KETONESUR NEGATIVE 05/21/2018 0901   PROTEINUR 100 (A) 05/21/2018 0901   NITRITE NEGATIVE 05/21/2018 0901   LEUKOCYTESUR NEGATIVE 05/21/2018 0901      Component Value Date/Time   PHART 7.340 (L) 05/21/2018 0305   PCO2ART 53.0 (H) 05/21/2018 0305   PO2ART 100 05/21/2018 0305   HCO3 27.9 05/21/2018 0305   TCO2 27 05/19/2018 0454   ACIDBASEDEF 1.1 05/20/2018 1240   O2SAT 96.4 05/21/2018 0305       Component Value Date/Time   FERRITIN 6,898 (H) 05/12/2018 2135       ASSESSMENT/PLAN:    48 year old female patient status post cardiac arrest of unknown duration, vent dependent respiratory failure, and anuric acute kidney injury, now dialysis dependent.  1.  Acute kidney injury likely secondary to ischemic ATN.  Kidney ultrasound on 4/27 with no obstruction.  Sub-nephrotic proteinuria noted.  Has been on dialysis since 4/28.  Has marginal urine output with worsening renal function.  For tunneled  dialysis catheter today followed by a dialysis treatment.  Awaiting clip.  2.  Cardiac arrest.  Presumed due to a respiratory event.  3.  Acute on chronic respiratory failure with hypoxia.  Oxygenating well on nasal cannula.  4.  Aspiration pneumonia and COPD exacerbation.  Status post antibiotics.  5.  Anemia.  Hemoglobin is around 9.8-10.  Continue to monitor.  Iron stores are appropriate.     [place], DO, [place]

## 2018-05-26 NOTE — Procedures (Signed)
ARF, ESRD  S/P RT IJ HD TUNNELED CATH  Tip svcra No comp Stable ebl min Ready for use Full report in pacs

## 2018-05-26 NOTE — Progress Notes (Signed)
Patient back from IR. Alert and oriented, no complaints of pain or other discomfort. R IJ HD tunneled cath.  Will continue to monitor.

## 2018-05-27 ENCOUNTER — Other Ambulatory Visit: Payer: Self-pay

## 2018-05-27 ENCOUNTER — Encounter (HOSPITAL_COMMUNITY): Payer: Self-pay

## 2018-05-27 ENCOUNTER — Inpatient Hospital Stay (HOSPITAL_COMMUNITY)
Admission: RE | Admit: 2018-05-27 | Discharge: 2018-06-05 | DRG: 945 | Disposition: A | Discharge status: Home Health | Payer: 59 | Source: Intra-hospital | Attending: Physical Medicine & Rehabilitation | Admitting: Physical Medicine & Rehabilitation

## 2018-05-27 DIAGNOSIS — N179 Acute kidney failure, unspecified: Secondary | ICD-10-CM

## 2018-05-27 DIAGNOSIS — Z8249 Family history of ischemic heart disease and other diseases of the circulatory system: Secondary | ICD-10-CM

## 2018-05-27 DIAGNOSIS — R739 Hyperglycemia, unspecified: Secondary | ICD-10-CM | POA: Diagnosis not present

## 2018-05-27 DIAGNOSIS — F419 Anxiety disorder, unspecified: Secondary | ICD-10-CM | POA: Diagnosis present

## 2018-05-27 DIAGNOSIS — F411 Generalized anxiety disorder: Secondary | ICD-10-CM | POA: Diagnosis present

## 2018-05-27 DIAGNOSIS — Z888 Allergy status to other drugs, medicaments and biological substances status: Secondary | ICD-10-CM

## 2018-05-27 DIAGNOSIS — Z8674 Personal history of sudden cardiac arrest: Secondary | ICD-10-CM | POA: Diagnosis not present

## 2018-05-27 DIAGNOSIS — Z825 Family history of asthma and other chronic lower respiratory diseases: Secondary | ICD-10-CM

## 2018-05-27 DIAGNOSIS — J441 Chronic obstructive pulmonary disease with (acute) exacerbation: Secondary | ICD-10-CM | POA: Diagnosis present

## 2018-05-27 DIAGNOSIS — J9621 Acute and chronic respiratory failure with hypoxia: Secondary | ICD-10-CM | POA: Diagnosis present

## 2018-05-27 DIAGNOSIS — Z833 Family history of diabetes mellitus: Secondary | ICD-10-CM

## 2018-05-27 DIAGNOSIS — Z9119 Patient's noncompliance with other medical treatment and regimen: Secondary | ICD-10-CM

## 2018-05-27 DIAGNOSIS — Z79899 Other long term (current) drug therapy: Secondary | ICD-10-CM

## 2018-05-27 DIAGNOSIS — G4733 Obstructive sleep apnea (adult) (pediatric): Secondary | ICD-10-CM | POA: Diagnosis present

## 2018-05-27 DIAGNOSIS — Z87891 Personal history of nicotine dependence: Secondary | ICD-10-CM

## 2018-05-27 DIAGNOSIS — D638 Anemia in other chronic diseases classified elsewhere: Secondary | ICD-10-CM | POA: Diagnosis present

## 2018-05-27 DIAGNOSIS — Z992 Dependence on renal dialysis: Secondary | ICD-10-CM | POA: Diagnosis not present

## 2018-05-27 DIAGNOSIS — I5030 Unspecified diastolic (congestive) heart failure: Secondary | ICD-10-CM | POA: Diagnosis present

## 2018-05-27 DIAGNOSIS — R413 Other amnesia: Secondary | ICD-10-CM | POA: Diagnosis present

## 2018-05-27 DIAGNOSIS — G9341 Metabolic encephalopathy: Secondary | ICD-10-CM | POA: Diagnosis present

## 2018-05-27 DIAGNOSIS — J449 Chronic obstructive pulmonary disease, unspecified: Secondary | ICD-10-CM | POA: Diagnosis present

## 2018-05-27 DIAGNOSIS — I10 Essential (primary) hypertension: Secondary | ICD-10-CM | POA: Diagnosis not present

## 2018-05-27 DIAGNOSIS — R414 Neurologic neglect syndrome: Secondary | ICD-10-CM | POA: Diagnosis present

## 2018-05-27 DIAGNOSIS — R0789 Other chest pain: Secondary | ICD-10-CM | POA: Diagnosis not present

## 2018-05-27 DIAGNOSIS — Z933 Colostomy status: Secondary | ICD-10-CM

## 2018-05-27 DIAGNOSIS — Z9981 Dependence on supplemental oxygen: Secondary | ICD-10-CM

## 2018-05-27 DIAGNOSIS — N170 Acute kidney failure with tubular necrosis: Secondary | ICD-10-CM | POA: Diagnosis present

## 2018-05-27 DIAGNOSIS — G931 Anoxic brain damage, not elsewhere classified: Secondary | ICD-10-CM

## 2018-05-27 DIAGNOSIS — I110 Hypertensive heart disease with heart failure: Secondary | ICD-10-CM | POA: Diagnosis present

## 2018-05-27 DIAGNOSIS — T380X5A Adverse effect of glucocorticoids and synthetic analogues, initial encounter: Secondary | ICD-10-CM | POA: Diagnosis not present

## 2018-05-27 DIAGNOSIS — R0989 Other specified symptoms and signs involving the circulatory and respiratory systems: Secondary | ICD-10-CM

## 2018-05-27 DIAGNOSIS — Z9114 Patient's other noncompliance with medication regimen: Secondary | ICD-10-CM

## 2018-05-27 DIAGNOSIS — R52 Pain, unspecified: Secondary | ICD-10-CM

## 2018-05-27 DIAGNOSIS — X58XXXA Exposure to other specified factors, initial encounter: Secondary | ICD-10-CM | POA: Diagnosis present

## 2018-05-27 DIAGNOSIS — S2243XA Multiple fractures of ribs, bilateral, initial encounter for closed fracture: Secondary | ICD-10-CM | POA: Diagnosis present

## 2018-05-27 DIAGNOSIS — J410 Simple chronic bronchitis: Secondary | ICD-10-CM | POA: Diagnosis not present

## 2018-05-27 DIAGNOSIS — R5381 Other malaise: Secondary | ICD-10-CM | POA: Diagnosis present

## 2018-05-27 DIAGNOSIS — J440 Chronic obstructive pulmonary disease with acute lower respiratory infection: Secondary | ICD-10-CM | POA: Diagnosis not present

## 2018-05-27 DIAGNOSIS — G8929 Other chronic pain: Secondary | ICD-10-CM | POA: Diagnosis present

## 2018-05-27 DIAGNOSIS — E872 Acidosis: Secondary | ICD-10-CM | POA: Diagnosis present

## 2018-05-27 DIAGNOSIS — G4700 Insomnia, unspecified: Secondary | ICD-10-CM | POA: Diagnosis present

## 2018-05-27 DIAGNOSIS — M48061 Spinal stenosis, lumbar region without neurogenic claudication: Secondary | ICD-10-CM | POA: Diagnosis present

## 2018-05-27 DIAGNOSIS — Z90710 Acquired absence of both cervix and uterus: Secondary | ICD-10-CM

## 2018-05-27 LAB — CBC
HCT: 33.6 % — ABNORMAL LOW (ref 36.0–46.0)
Hemoglobin: 10.6 g/dL — ABNORMAL LOW (ref 12.0–15.0)
MCH: 29.4 pg (ref 26.0–34.0)
MCHC: 31.5 g/dL (ref 30.0–36.0)
MCV: 93.3 fL (ref 80.0–100.0)
Platelets: 202 10*3/uL (ref 150–400)
RBC: 3.6 MIL/uL — ABNORMAL LOW (ref 3.87–5.11)
RDW: 13.4 % (ref 11.5–15.5)
WBC: 10.1 10*3/uL (ref 4.0–10.5)
nRBC: 0 % (ref 0.0–0.2)

## 2018-05-27 LAB — GLUCOSE, CAPILLARY
Glucose-Capillary: 95 mg/dL (ref 70–99)
Glucose-Capillary: 102 mg/dL — ABNORMAL HIGH (ref 70–99)

## 2018-05-27 MED ORDER — NEPRO/CARBSTEADY PO LIQD
237.0000 mL | Freq: Three times a day (TID) | ORAL | Status: DC
  Administered 2018-05-30 – 2018-05-31 (×3): 237 mL via ORAL

## 2018-05-27 MED ORDER — PROCHLORPERAZINE 25 MG RE SUPP
12.5000 mg | Freq: Four times a day (QID) | RECTAL | Status: DC | PRN
  Filled 2018-05-27: qty 1

## 2018-05-27 MED ORDER — PROCHLORPERAZINE MALEATE 5 MG PO TABS
5.0000 mg | ORAL_TABLET | Freq: Four times a day (QID) | ORAL | Status: DC | PRN
  Administered 2018-05-29 (×2): 10 mg via ORAL
  Filled 2018-05-27 (×3): qty 2

## 2018-05-27 MED ORDER — HEPARIN SODIUM (PORCINE) 5000 UNIT/ML IJ SOLN
5000.0000 [IU] | Freq: Three times a day (TID) | INTRAMUSCULAR | Status: DC
  Administered 2018-05-27 – 2018-06-05 (×26): 5000 [IU] via SUBCUTANEOUS
  Filled 2018-05-27 (×27): qty 1

## 2018-05-27 MED ORDER — TRAZODONE HCL 50 MG PO TABS: 25.0000 mg | ORAL_TABLET | Freq: Every evening | ORAL | Status: DC | PRN

## 2018-05-27 MED ORDER — ALBUTEROL SULFATE (2.5 MG/3ML) 0.083% IN NEBU
2.5000 mg | INHALATION_SOLUTION | RESPIRATORY_TRACT | Status: DC | PRN
  Administered 2018-05-27 – 2018-06-03 (×5): 2.5 mg via RESPIRATORY_TRACT
  Filled 2018-05-27 (×5): qty 3

## 2018-05-27 MED ORDER — ALUMINUM HYDROXIDE GEL 320 MG/5ML PO SUSP
10.0000 mL | Freq: Four times a day (QID) | ORAL | Status: DC | PRN
  Filled 2018-05-27: qty 30

## 2018-05-27 MED ORDER — NEPRO/CARBSTEADY PO LIQD: 237.0000 mL | Freq: Three times a day (TID) | ORAL | 0 refills | Status: DC

## 2018-05-27 MED ORDER — MELATONIN 3 MG PO TABS
3.0000 mg | ORAL_TABLET | Freq: Every evening | ORAL | Status: DC | PRN
  Administered 2018-05-27 – 2018-05-28 (×2): 3 mg via ORAL
  Filled 2018-05-27 (×3): qty 1

## 2018-05-27 MED ORDER — ALUM & MAG HYDROXIDE-SIMETH 200-200-20 MG/5ML PO SUSP
10.0000 mL | Freq: Four times a day (QID) | ORAL | Status: DC | PRN
  Administered 2018-05-29: 05:00:00 10 mL via ORAL
  Filled 2018-05-27: qty 30

## 2018-05-27 MED ORDER — ALPRAZOLAM 0.25 MG PO TABS: 0.2500 mg | ORAL_TABLET | Freq: Three times a day (TID) | ORAL | 0 refills | Status: DC

## 2018-05-27 MED ORDER — AMLODIPINE BESYLATE 5 MG PO TABS: 5.0000 mg | ORAL_TABLET | Freq: Every day | ORAL | 0 refills | Status: DC

## 2018-05-27 MED ORDER — ESCITALOPRAM OXALATE 10 MG PO TABS
10.0000 mg | ORAL_TABLET | Freq: Every day | ORAL | Status: DC
  Administered 2018-05-28 – 2018-06-05 (×9): 10 mg via ORAL
  Filled 2018-05-27 (×9): qty 1

## 2018-05-27 MED ORDER — GUAIFENESIN-DM 100-10 MG/5ML PO SYRP
5.0000 mL | ORAL_SOLUTION | Freq: Four times a day (QID) | ORAL | Status: DC | PRN
  Filled 2018-05-27: qty 10

## 2018-05-27 MED ORDER — MELATONIN 3 MG PO TABS: 3.0000 mg | ORAL_TABLET | Freq: Every evening | ORAL | 0 refills | Status: DC | PRN

## 2018-05-27 MED ORDER — PROCHLORPERAZINE EDISYLATE 10 MG/2ML IJ SOLN
5.0000 mg | Freq: Four times a day (QID) | INTRAMUSCULAR | Status: DC | PRN
  Filled 2018-05-27: qty 2

## 2018-05-27 MED ORDER — PREDNISONE 10 MG PO TABS: ORAL_TABLET | ORAL | 0 refills | Status: DC

## 2018-05-27 MED ORDER — DIPHENHYDRAMINE HCL 12.5 MG/5ML PO ELIX
12.5000 mg | ORAL_SOLUTION | Freq: Four times a day (QID) | ORAL | Status: DC | PRN
  Filled 2018-05-27: qty 10

## 2018-05-27 MED ORDER — SEVELAMER CARBONATE 800 MG PO TABS
800.0000 mg | ORAL_TABLET | Freq: Three times a day (TID) | ORAL | Status: DC
  Administered 2018-05-27: 800 mg via ORAL
  Filled 2018-05-27: qty 1

## 2018-05-27 MED ORDER — CHLORHEXIDINE GLUCONATE CLOTH 2 % EX PADS
6.0000 | MEDICATED_PAD | Freq: Every day | CUTANEOUS | Status: DC
  Administered 2018-05-28 – 2018-05-30 (×3): 6 via TOPICAL

## 2018-05-27 MED ORDER — IPRATROPIUM-ALBUTEROL 0.5-2.5 (3) MG/3ML IN SOLN
3.0000 mL | Freq: Three times a day (TID) | RESPIRATORY_TRACT | Status: DC
  Administered 2018-05-27 – 2018-06-03 (×19): 3 mL via RESPIRATORY_TRACT
  Filled 2018-05-27 (×19): qty 3

## 2018-05-27 MED ORDER — SEVELAMER CARBONATE 800 MG PO TABS: 800.0000 mg | ORAL_TABLET | Freq: Three times a day (TID) | ORAL | 1 refills | Status: DC

## 2018-05-27 MED ORDER — AMLODIPINE BESYLATE 5 MG PO TABS
5.0000 mg | ORAL_TABLET | Freq: Every day | ORAL | Status: DC
  Administered 2018-05-28: 09:00:00 5 mg via ORAL
  Filled 2018-05-27: qty 1

## 2018-05-27 MED ORDER — RESOURCE THICKENUP CLEAR PO POWD
ORAL | Status: DC | PRN
  Filled 2018-05-27: qty 125

## 2018-05-27 MED ORDER — ACETAMINOPHEN 325 MG PO TABS
325.0000 mg | ORAL_TABLET | ORAL | Status: DC | PRN
  Administered 2018-05-27 – 2018-06-05 (×30): 650 mg via ORAL
  Filled 2018-05-27 (×30): qty 2

## 2018-05-27 MED ORDER — METHYLPREDNISOLONE SODIUM SUCC 125 MG IJ SOLR
40.0000 mg | Freq: Every day | INTRAMUSCULAR | Status: DC
  Filled 2018-05-27: qty 2

## 2018-05-27 MED ORDER — TRAZODONE HCL 50 MG PO TABS
50.0000 mg | ORAL_TABLET | Freq: Every evening | ORAL | Status: DC | PRN
  Administered 2018-05-27 – 2018-06-04 (×5): 50 mg via ORAL
  Filled 2018-05-27 (×6): qty 1

## 2018-05-27 MED ORDER — ALPRAZOLAM 0.25 MG PO TABS
0.2500 mg | ORAL_TABLET | Freq: Three times a day (TID) | ORAL | Status: DC
  Administered 2018-05-27 – 2018-06-05 (×27): 0.25 mg via ORAL
  Filled 2018-05-27 (×28): qty 1

## 2018-05-27 MED ORDER — POLYETHYLENE GLYCOL 3350 17 G PO PACK
17.0000 g | PACK | Freq: Every day | ORAL | Status: DC | PRN
  Filled 2018-05-27: qty 1

## 2018-05-27 MED ORDER — TRAZODONE HCL 50 MG PO TABS: 50.0000 mg | ORAL_TABLET | Freq: Every evening | ORAL | 1 refills | Status: DC | PRN

## 2018-05-27 MED ORDER — PANTOPRAZOLE SODIUM 40 MG PO TBEC
40.0000 mg | DELAYED_RELEASE_TABLET | Freq: Every day | ORAL | Status: DC
  Administered 2018-05-28 – 2018-06-05 (×9): 40 mg via ORAL
  Filled 2018-05-27 (×9): qty 1

## 2018-05-27 MED ORDER — BISACODYL 10 MG RE SUPP: 10.0000 mg | Freq: Every day | RECTAL | Status: DC | PRN

## 2018-05-27 NOTE — Progress Notes (Signed)
Home CPAP set up for pt at beside. Will assist patient when she is ready for bed. RT will continue to monitor.

## 2018-05-27 NOTE — Progress Notes (Addendum)
[facility]    NEPHROLOGY PROGRESS NOTE  SUBJECTIVE: Patient seen earlier this morning.  Reports being uncomfortable and really wants to go home.  Denies any chest pain, shortness of breath, nausea, vomiting, diarrhea or dysuria.  Complains of difficulty sleeping due to being off her xanax. All other review of systems are negative.    OBJECTIVE:  Vitals:   05/27/18 0819 05/27/18 0909  BP:  (!) 145/70  Pulse:    Resp:    Temp:    SpO2: 94%     Intake/Output Summary (Last 24 hours) at 05/27/2018 1039 Last data filed at 05/26/2018 2245 Gross per 24 hour  Intake 340 ml  Output 2800 ml  Net -2460 ml      General:  AAOx3 NAD drowsy HEENT: MMM [place] AT anicteric sclera Neck:  No JVD, no adenopathy CV:  Heart RRR  Lungs:  L/S CTA bilaterally Abd:  abd SNT/ND with normal BS GU:  Bladder non-palpable Extremities: Trace bilateral lower extremity edema. Skin:  No skin rash  MEDICATIONS:  . ALPRAZolam  0.25 mg Oral TID  . amLODipine  5 mg Oral Daily  . chlorhexidine gluconate (MEDLINE KIT)  15 mL Mouth Rinse BID  . Chlorhexidine Gluconate Cloth  6 each Topical Daily  . Chlorhexidine Gluconate Cloth  6 each Topical Q0600  . Chlorhexidine Gluconate Cloth  6 each Topical Q0600  . escitalopram  10 mg Oral Daily  . feeding supplement (NEPRO CARB STEADY)  237 mL Oral TID BM  . heparin  5,000 Units Subcutaneous Q8H  . insulin aspart  0-15 Units Subcutaneous Q4H  . ipratropium-albuterol  3 mL Nebulization TID  . mouth rinse  15 mL Mouth Rinse BID  . methylPREDNISolone (SOLU-MEDROL) injection  40 mg Intravenous Daily  . pantoprazole  40 mg Oral Daily  . sodium polystyrene  30 g Rectal Once       LABS:   CBC Latest Ref Rng & Units 05/27/2018 05/25/2018 05/24/2018  WBC 4.0 - 10.5 K/uL 10.1 10.4 9.7  Hemoglobin 12.0 - 15.0 g/dL 10.6(L) 9.8(L) 9.7(L)  Hematocrit 36.0 - 46.0 % 33.6(L) 31.7(L) 30.7(L)  Platelets 150 - 400 K/uL 202 244 212    CMP Latest Ref Rng & Units  05/27/2018 05/26/2018 05/25/2018  Glucose 70 - 99 mg/dL 96 91 79  BUN 6 - 20 mg/dL 20 53(H) 64(H)  Creatinine 0.44 - 1.00 mg/dL 2.86(H) 5.64(H) 6.52(H)  Sodium 135 - 145 mmol/L 135 137 138  Potassium 3.5 - 5.1 mmol/L 3.7 4.0 4.1  Chloride 98 - 111 mmol/L 93(L) 97(L) 98  CO2 22 - 32 mmol/L 25 27 27   Calcium 8.9 - 10.3 mg/dL 8.8(L) 8.9 8.9  Total Protein 6.5 - 8.1 g/dL - - -  Total Bilirubin 0.3 - 1.2 mg/dL - - -  Alkaline Phos 38 - 126 U/L - - -  AST 15 - 41 U/L - - -  ALT 0 - 44 U/L - - -    Lab Results  Component Value Date   CALCIUM 8.8 (L) 05/27/2018   CAION 1.16 05/19/2018   PHOS 6.8 (H) 05/19/2018       Component Value Date/Time   COLORURINE YELLOW 05/21/2018 0901   APPEARANCEUR HAZY (A) 05/21/2018 0901   LABSPEC 1.009 05/21/2018 0901   PHURINE 6.0 05/21/2018 0901   GLUCOSEU NEGATIVE 05/21/2018 0901   HGBUR SMALL (A) 05/21/2018 0901   BILIRUBINUR NEGATIVE 05/21/2018 0901   KETONESUR NEGATIVE 05/21/2018 0901   PROTEINUR 100 (A) 05/21/2018 0901  NITRITE NEGATIVE 05/21/2018 0901   LEUKOCYTESUR NEGATIVE 05/21/2018 0901      Component Value Date/Time   PHART 7.340 (L) 05/21/2018 0305   PCO2ART 53.0 (H) 05/21/2018 0305   PO2ART 100 05/21/2018 0305   HCO3 27.9 05/21/2018 0305   TCO2 27 05/19/2018 0454   ACIDBASEDEF 1.1 05/20/2018 1240   O2SAT 96.4 05/21/2018 0305       Component Value Date/Time   FERRITIN 6,898 (H) 05/12/2018 2135       ASSESSMENT/PLAN:    48 year old female patient status post cardiac arrest of unknown duration, vent dependent respiratory failure, and anuric acute kidney injury, now dialysis dependent.  1.  Acute kidney injury likely secondary to ischemic ATN.  Kidney ultrasound on 4/27 with no obstruction.  Sub-nephrotic proteinuria noted.  Has been on dialysis since 4/28.  Has marginal urine output with worsening renal function.  [place] placed 05/26/18.  Awaiting clip.  Had 2 hours and 50 minutes of HD yesterday prior to signing off the machine.  Will  attempt HD again on Monday.    2.  Cardiac arrest.  Presumed due to a respiratory event.  3.  Acute on chronic respiratory failure with hypoxia.  Oxygenating well on nasal cannula.  4.  Aspiration pneumonia and COPD exacerbation.  Status post antibiotics.  5.  Anemia.  Hemoglobin is around 9.8-10.  Continue to monitor.  Iron stores are appropriate.  6.  Hyperphosphatemia.  Will add binder.  7.  Hypertension.  Started on amlodipine yesterday.  Will monitor.    [place], DO, [place]

## 2018-05-27 NOTE — Progress Notes (Signed)
Patient arrived and oriented to unit. Verbalized agreement not to get up without assistance. Feeling nauseated but refused compazine. Given diet ginger ale. Resting with call bell in place.

## 2018-05-27 NOTE — Progress Notes (Signed)
Assistance was given with patient home NIV machine. O2 bled in. Humidity provided

## 2018-05-27 NOTE — H&P (Signed)
Physical Medicine and Rehabilitation Admission H&P        Chief Complaint  Patient presents with  . Functional deficits due to metabolic encephalopathy/debility      HPI:  Misty Green is a 48 year old female with history of COPD, OSA- 3 L oxygen at nights, CHF, spinal stenosis who was admitted on 05/12/18 after bing found unresponsive and required 13-14 minutes of CPR by EMS. Per reports, patient had been feeling bad since 4/19 and had been sleeping for most of the day with husband checking in on her every 30-45 minutes. She was intubated in ED and started on epinephrine. She was started on IV antibiotics and steroids for acute hypoxic/hypercapneic failure with presumed aspiration event. 2 D echo showed EF 60-65% with normal systolic function. EEG done due to encephalopathy and showed discontinuous generalized background slowing. Neurology expressed concerns about recovery from hypoxic injury and CT/MRI head were negative for acute changes or anoxic injury. She developed oliguric renal failure with rise in SCr due to AKI and was started on HD on 4/28.  She was extubated to BIPAP on 4/30 and has had issues with agitation. She has had poor renal recovery and temporary HD catheter  changed to tunneled IJ by Dr. Annamaria Boots on 5/8. Mentation improving but she continues to be limited by hypoxia, debility and cognitive deficits.  Repeat MRI brain 5/5 was negative for infarct or global anoxic injury. CIR recommended due to functional decline.      Review of Systems  Constitutional: Negative for chills and fever.  HENT: Negative for hearing loss.   Eyes: Negative for blurred vision.  Respiratory: Positive for cough.   Cardiovascular: Positive for chest pain (mid sternal chest pain).  Gastrointestinal: Negative for heartburn and nausea.  Genitourinary: Negative for dysuria.  Musculoskeletal: Positive for neck pain (near line placement).  Skin: Negative for rash.  Neurological: Positive for  sensory change (LLE due to nerve damage) and focal weakness (LLE due to nerve injury).  Psychiatric/Behavioral: Positive for memory loss.          Past Medical History:  Diagnosis Date  . CHF (congestive heart failure) ([place])    . COPD (chronic obstructive pulmonary disease) ([place])    . Current smoker    . Opiate abuse, continuous ([place])             Past Surgical History:  Procedure Laterality Date  . ABDOMINAL HYSTERECTOMY      . BACK SURGERY      . cesction      . COLOSTOMY   01/13/2018    Procedure: COLOSTOMY Creation;  Surgeon: Jules Husbands, MD;  Location: [facility] ORS;  Service: General;;  . INCISIONAL HERNIA REPAIR        lower midline laparotomy incision (hysterectomy), repaired with mesh  . LAPAROSCOPIC CHOLECYSTECTOMY   2012  . LAPAROSCOPIC LYSIS OF ADHESIONS   01/13/2018    Procedure: LAPAROSCOPIC LYSIS OF ADHESIONS;  Surgeon: Jules Husbands, MD;  Location: [facility] ORS;  Service: General;;  . LAPAROSCOPIC SIGMOID COLECTOMY   01/13/2018    Procedure: LAPAROSCOPIC SIGMOID COLECTOMY;  Surgeon: Jules Husbands, MD;  Location: [facility] ORS;  Service: General;;  . ORIF WRIST FRACTURE Right 08/25/2015    Procedure: OPEN REDUCTION INTERNAL FIXATION (ORIF) WRIST FRACTURE;  Surgeon: Corky Mull, MD;  Location: [facility] ORS;  Service: Orthopedics;  Laterality: Right;           Family History  Problem Relation Age of  Onset  . COPD Mother    . Hypertension Father    . Diabetes Father        Social History:  Married. Independent PTA. She reports that she has quit smoking. Her smoking use included cigarettes. She has a 15.00 pack-year smoking history. She has never used smokeless tobacco. Per reports current drug use. Drug: Marijuana. She reports that she does not drink alcohol.          Allergies  Allergen Reactions  . Other Nausea And Vomiting      Anti-depressent that starts with t            Medications Prior to Admission  Medication Sig Dispense Refill  . acetaminophen (TYLENOL)  500 MG tablet Take 500 mg by mouth every 6 (six) hours as needed for mild pain or fever.      Marland Kitchen albuterol (PROVENTIL HFA;VENTOLIN HFA) 108 (90 Base) MCG/ACT inhaler Inhale into the lungs every 6 (six) hours as needed for wheezing or shortness of breath.      . ALPRAZolam (XANAX) 1 MG tablet Take 1 mg by mouth 4 (four) times daily.      . Ascorbic Acid (VITAMIN C) 1000 MG tablet Take 1,000 mg by mouth daily.      . diphenhydrAMINE (BENADRYL) 50 MG capsule Take 50 mg by mouth at bedtime as needed for sleep.      Marland Kitchen escitalopram (LEXAPRO) 10 MG tablet Take 10 mg by mouth daily.      . Fluticasone-Salmeterol (ADVAIR) 250-50 MCG/DOSE AEPB Inhale 1 puff into the lungs 2 (two) times daily.      . furosemide (LASIX) 40 MG tablet Take 40 mg by mouth daily as needed for fluid.       . Multiple Vitamins-Minerals (MULTIVITAMIN WITH MINERALS) tablet Take 1 tablet by mouth daily.      . pantoprazole (PROTONIX) 40 MG tablet Take 40 mg by mouth daily.      . roflumilast (DALIRESP) 500 MCG TABS tablet Take 500 mcg by mouth daily.      Marland Kitchen tiotropium (SPIRIVA) 18 MCG inhalation capsule Place 18 mcg into inhaler and inhale daily.      . cyclobenzaprine (FLEXERIL) 5 MG tablet Take 1 tablet (5 mg total) by mouth 3 (three) times daily. (Patient not taking: Reported on 05/13/2018) 30 tablet 0  . docusate sodium (COLACE) 100 MG capsule Take 100 mg by mouth 2 (two) times daily.      Marland Kitchen gabapentin (NEURONTIN) 300 MG capsule Take 800 mg by mouth 3 (three) times daily.       . metaxalone (SKELAXIN) 400 MG tablet Take 1 tablet (400 mg total) by mouth every 8 (eight) hours as needed for muscle spasms. (Patient not taking: Reported on 05/13/2018) 20 tablet 0  . ondansetron (ZOFRAN ODT) 4 MG disintegrating tablet Take 1 tablet (4 mg total) by mouth every 8 (eight) hours as needed for nausea or vomiting. (Patient not taking: Reported on 05/13/2018) 20 tablet 0  . ondansetron (ZOFRAN) 8 MG tablet Take 1 tablet (8 mg total) by mouth every 8  (eight) hours as needed for nausea or vomiting. 20 tablet 0  . zolpidem (AMBIEN) 10 MG tablet Take 10 mg by mouth at bedtime as needed for sleep.          Drug Regimen Review  Drug regimen was reviewed and remains appropriate with no significant issues identified   Home: Home Living Family/patient expects to be discharged to:: Private residence Living Arrangements: Spouse/significant other  Available Help at Discharge: Family Type of Home: House Home Access: Stairs to enter [employer] of Steps: 5 back; 4 in front Entrance Stairs-Rails: Can reach both Home Layout: One level Bathroom Shower/Tub: Chiropodist: Handicapped height Home Equipment: None  Lives With: Spouse   Functional History: Prior Function Level of Independence: Independent Comments: Independent with all ADL/IADL tasks and mobility; takes care of her 3 grandchildren per admit in 2019   Functional Status:  Mobility: Bed Mobility Overal bed mobility: Needs Assistance Bed Mobility: Sit to Supine Supine to sit: Min assist Sit to supine: Supervision General bed mobility comments: no physical assist needed Transfers Overall transfer level: Needs assistance Equipment used: Rolling walker (2 wheeled) Transfers: Sit to/from Stand, [employer] Transfers Sit to Stand: Min assist Stand pivot transfers: Min assist General transfer comment: sit to stand with assist for stability. Ambulation/Gait Ambulation/Gait assistance: Min assist, Mod assist, +2 physical assistance Gait Distance (Feet): 155 Feet Assistive device: Bilateral platform walker(EVA walker ) Gait Pattern/deviations: Scissoring, Staggering left, Staggering right, Narrow base of support, Trunk flexed, Leaning posteriorly, Step-through pattern, Decreased stride length General Gait Details: Pt was able to walk with [name] walker with cues and assist for stability as pt was scissoring at times. Cues for safety.  unsteady at times.   Remained on 4 lts to achieve sats > 90% Gait velocity: decreased  Gait velocity interpretation: <1.31 ft/sec, indicative of household ambulator   ADL: ADL Overall ADL's : Needs assistance/impaired Eating/Feeding: Moderate assistance, Bed level(in chair position) Grooming: Oral care, Sitting, Minimal assistance Grooming Details (indicate cue type and reason): matting noted in hair. Shampoo cap applied and attempted to de-tangle with comb. no success today. RN aware. Pt will need detangler and significant amt of time to work on matting- potentially might need it cut out Upper Body Bathing: Minimal assistance Lower Body Bathing: Maximal assistance Upper Body Dressing : Moderate assistance, Sitting Upper Body Dressing Details (indicate cue type and reason): to doff front opening gown Lower Body Dressing: Maximal assistance Lower Body Dressing Details (indicate cue type and reason): to don socks Toilet Transfer: Minimal assistance, +2 for physical assistance, +2 for safety/equipment, Ambulation(EVA walker) Toilet Transfer Details (indicate cue type and reason): face to face transfer Toileting- Clothing Manipulation and Hygiene: Maximal assistance, Sit to/from stand Toileting - Clothing Manipulation Details (indicate cue type and reason): Pt with colostomy bag at baseline, no balance for perianal care Functional mobility during ADLs: Min guard, Rolling walker General ADL Comments: confusion, agitation about not being able to leave.   Cognition: Cognition Overall Cognitive Status: Impaired/Different from baseline Arousal/Alertness: Awake/alert Orientation Level: Oriented to person, Oriented to place, Oriented to situation, Disoriented to time Attention: Sustained Sustained Attention: Impaired Sustained Attention Impairment: Functional basic, Verbal basic Memory: Impaired Memory Impairment: Storage deficit, Retrieval deficit, Decreased recall of new information Awareness: Impaired Awareness  Impairment: Intellectual impairment, Emergent impairment, Anticipatory impairment Safety/Judgment: Impaired Cognition Arousal/Alertness: Awake/alert Behavior During Therapy: WFL for tasks assessed/performed Overall Cognitive Status: Impaired/Different from baseline Area of Impairment: Memory, Safety/judgement, Problem solving, Attention Orientation Level: Disoriented to, Situation, Time, Place Current Attention Level: Sustained Memory: Decreased short-term memory, Decreased recall of precautions Following Commands: Follows one step commands with increased time Safety/Judgement: Decreased awareness of safety, Decreased awareness of deficits Awareness: Intellectual Problem Solving: Slow processing, Decreased initiation, Difficulty sequencing, Requires verbal cues, Requires tactile cues General Comments: pt with difficulty recalling during conversation     Blood pressure (!) 158/91, pulse 75, temperature 99.3 F (37.4 C), resp. rate  17, height 5\' 1"  (1.549 m), weight 86.5 kg, SpO2 98 %. Physical Exam  Nursing note and vitals reviewed. Constitutional: She appears distressed. Nasal cannula in place.  Disoriented.   HENT:  Head: Normocephalic and atraumatic.  Eyes: Pupils are equal, round, and reactive to light.  Neck: No tracheal deviation present.  Cardiovascular: Normal rate. Exam reveals no friction rub.  No murmur heard. Respiratory: Effort normal. No stridor. No respiratory distress. She has no wheezes.  GI: Soft. She exhibits no distension. There is no abdominal tenderness.  Musculoskeletal: Normal range of motion.        General: No edema.  Neurological: She is alert.  Oriented to self and place a "hospital" in [place]. Does not recall situation and focused on going home. Does not have awareness of deficits. Distracted. Follows basic commands. Moves all 4's 4- /5. Senses pain in all 4's.   Skin: Skin is warm. She is not diaphoretic.  Psychiatric:  Flat, fairly cooperative       Lab Results Last 48 Hours        Results for orders placed or performed during the hospital encounter of 05/12/18 (from the past 48 hour(s))  Glucose, capillary     Status: Abnormal    Collection Time: 05/24/18  4:15 PM  Result Value Ref Range    Glucose-Capillary 103 (H) 70 - 99 mg/dL  Glucose, capillary     Status: Abnormal    Collection Time: 05/24/18  8:03 PM  Result Value Ref Range    Glucose-Capillary 124 (H) 70 - 99 mg/dL  Glucose, capillary     Status: None    Collection Time: 05/25/18 12:16 AM  Result Value Ref Range    Glucose-Capillary 89 70 - 99 mg/dL  Glucose, capillary     Status: None    Collection Time: 05/25/18  4:17 AM  Result Value Ref Range    Glucose-Capillary 81 70 - 99 mg/dL  Magnesium     Status: None    Collection Time: 05/25/18  4:26 AM  Result Value Ref Range    Magnesium 2.3 1.7 - 2.4 mg/dL      Comment: Performed at [facility], [place] [address], [place], [place] [address]  CBC     Status: Abnormal    Collection Time: 05/25/18  4:26 AM  Result Value Ref Range    WBC 10.4 4.0 - 10.5 K/uL    RBC 3.34 (L) 3.87 - 5.11 MIL/uL    Hemoglobin 9.8 (L) 12.0 - 15.0 g/dL    HCT 31.7 (L) 36.0 - 46.0 %    MCV 94.9 80.0 - 100.0 fL    MCH 29.3 26.0 - 34.0 pg    MCHC 30.9 30.0 - 36.0 g/dL    RDW 13.4 11.5 - 15.5 %    Platelets 244 150 - 400 K/uL    nRBC 0.0 0.0 - 0.2 %      Comment: Performed at [facility], [place] [address], [place], [place] [address]  Basic metabolic panel     Status: Abnormal    Collection Time: 05/25/18  4:26 AM  Result Value Ref Range    Sodium 138 135 - 145 mmol/L    Potassium 4.1 3.5 - 5.1 mmol/L    Chloride 98 98 - 111 mmol/L    CO2 27 22 - 32 mmol/L    Glucose, Bld 79 70 - 99 mg/dL    BUN 64 (H) 6 - 20 mg/dL    Creatinine, Ser  6.52 (H) 0.44 - 1.00 mg/dL    Calcium 8.9 8.9 - 10.3 mg/dL    GFR calc non Af Amer 7 (L) >60 mL/min    GFR calc Af Amer 8 (L) >60 mL/min    Anion gap 13 5 - 15      Comment: Performed at  [place] [address], [place], [place] [address]  Glucose, capillary     Status: None    Collection Time: 05/25/18  7:54 AM  Result Value Ref Range    Glucose-Capillary 86 70 - 99 mg/dL  Glucose, capillary     Status: Abnormal    Collection Time: 05/25/18  4:38 PM  Result Value Ref Range    Glucose-Capillary 101 (H) 70 - 99 mg/dL  Glucose, capillary     Status: Abnormal    Collection Time: 05/25/18  8:19 PM  Result Value Ref Range    Glucose-Capillary 155 (H) 70 - 99 mg/dL  Glucose, capillary     Status: Abnormal    Collection Time: 05/26/18 12:25 AM  Result Value Ref Range    Glucose-Capillary 61 (L) 70 - 99 mg/dL  Glucose, capillary     Status: Abnormal    Collection Time: 05/26/18  1:19 AM  Result Value Ref Range    Glucose-Capillary 129 (H) 70 - 99 mg/dL  Glucose, capillary     Status: None    Collection Time: 05/26/18  4:04 AM  Result Value Ref Range    Glucose-Capillary 84 70 - 99 mg/dL  Basic metabolic panel     Status: Abnormal    Collection Time: 05/26/18  4:43 AM  Result Value Ref Range    Sodium 137 135 - 145 mmol/L    Potassium 4.0 3.5 - 5.1 mmol/L    Chloride 97 (L) 98 - 111 mmol/L    CO2 27 22 - 32 mmol/L    Glucose, Bld 91 70 - 99 mg/dL    BUN 53 (H) 6 - 20 mg/dL    Creatinine, Ser 5.64 (H) 0.44 - 1.00 mg/dL    Calcium 8.9 8.9 - 10.3 mg/dL    GFR calc non Af Amer 8 (L) >60 mL/min    GFR calc Af Amer 10 (L) >60 mL/min    Anion gap 13 5 - 15      Comment: Performed at [facility], [address], [place], [place] [address]  Protime-INR     Status: None    Collection Time: 05/26/18  4:43 AM  Result Value Ref Range    Prothrombin Time 13.6 11.4 - 15.2 seconds    INR 1.1 0.8 - 1.2      Comment: (NOTE) INR goal varies based on device and disease states. Performed at [facility], [place] [address], [place], [place] [address]    Glucose, capillary     Status: None    Collection Time: 05/26/18  7:30 AM  Result Value Ref Range     Glucose-Capillary 80 70 - 99 mg/dL  Glucose, capillary     Status: None    Collection Time: 05/26/18 12:37 PM  Result Value Ref Range    Glucose-Capillary 95 70 - 99 mg/dL    Comment 1 Notify RN        Imaging Results (Last 48 hours)  No results found.           Medical Problem List and Plan: 1.   Anoxic encephalopath and debility secondary to cardiac arrest from acute  on chronic respiratory failure.              -admit to inpatient rehab 2.  Antithrombotics: -DVT/anticoagulation:  Pharmaceutical: Heparin             -antiplatelet therapy: N/A 3. Pain Management: Tylenol prn  4. Mood: LCSW to follow for evaluation and support.              -antipsychotic agents:   N/A  5. Neuropsych: This patient is not capable of making decisions on his own behalf. 6. Skin/Wound Care: routine pressure relief measures.  7. Fluids/Electrolytes/Nutrition: Strict I/O. Renal diet with 1200 cc FR.  Will need to consult RD for education as mentation improves.  8. COPD with acute on chronic respiratory failure: Oxygen down to 80's with activity-currently requiring 4 L per [place].  Encourage pulmonary hygiene/flutter valve use. Continues on IV solumedrol as well as duonebs tid.              -simplify regimen as possible 9. OSA: Now requires BIPAP with oxygen at nights.  10 Spinal stenosis/chronic pain: Used tylenol prn at home.  11. Acute renal failure/HD dependent: HD to continue 12. HTN: Controlled on Norvasc. Monitor BID.  13. Anemia of chronic disease: Stable overall. Continue to monitor  14. Anxiety d/o: On Lexapro with alprazolam tid.      Post Admission Physician Evaluation: 1. Functional deficits secondary  to encephalopathy. 2. Patient is admitted to receive collaborative, interdisciplinary care between the physiatrist, rehab nursing staff, and therapy team. 3. Patient's level of medical complexity and substantial therapy needs in context of that medical necessity cannot be provided at a lesser  intensity of care such as a SNF. 4. Patient has experienced substantial functional loss from his/her baseline which was documented above under the "Functional History" and "Functional Status" headings.  Judging by the patient's diagnosis, physical exam, and functional history, the patient has potential for functional progress which will result in measurable gains while on inpatient rehab.  These gains will be of substantial and practical use upon discharge  in facilitating mobility and self-care at the household level. 5. Physiatrist will provide 24 hour management of medical needs as well as oversight of the therapy plan/treatment and provide guidance as appropriate regarding the interaction of the two. 6. The Preadmission Screening has been reviewed and patient status is unchanged unless otherwise stated above. 7. 24 hour rehab nursing will assist with bladder management, bowel management, safety, skin/wound care, disease management, medication administration, pain management and patient education  and help integrate therapy concepts, techniques,education, etc. 8. PT will assess and treat for/with: Lower extremity strength, range of motion, stamina, balance, functional mobility, safety, adaptive techniques and equipment, NMR.   Goals are: supervision to min assist. 9. OT will assess and treat for/with: ADL's, functional mobility, safety, upper extremity strength, adaptive techniques and equipment, NMR.   Goals are: supervision to min assist. Therapy may proceed with showering this patient. 10. SLP will assess and treat for/with: cognition, communication, family ed.  Goals are: supervision to min assist. 11. Case Management and Social Worker will assess and treat for psychological issues and discharge planning. 12. Team conference will be held weekly to assess progress toward goals and to determine barriers to discharge. 13. Patient will receive at least 3 hours of therapy per day at least 5 days per  week. 14. ELOS: 20-30 days       15. Prognosis:  excellent   I have personally performed a face to face  diagnostic evaluation of this patient and formulated the key components of the plan.  Additionally, I have personally reviewed laboratory data, imaging studies, as well as relevant notes and concur with the physician assistant's documentation above.  Meredith Staggers, MD, [place], PA-C 05/26/2018

## 2018-05-27 NOTE — Discharge Summary (Signed)
Physician Discharge Summary  Misty Green [facility]:[id number] DOB: [birth year] DOA: 05/12/2018  PCP: Misty Du, MD  Admit date: 05/12/2018 Discharge date: 05/27/2018  Admitted From: Home  Disposition:  CIR   Recommendations for Outpatient Follow-up:  1. Follow up with PCP in 1-2 weeks after discharge from Rehab 2. Please obtain BMP/CBC in one week while in Rehab or by PCP if already discharged 3. Given her cognitive impairments, strongly recommend continuing Xanax taper and discontinuing ultimately by her PCP    Home Health: N/A  Equipment/Devices: TBD at CIR  Discharge Condition: Fair  CODE STATUS: FULL Diet recommendation: Dysphagia 3 (Mech soft) solids;Thin liquid  Brief/Interim Summary: Misty Green is a 48 y.o. F with COPD not on home O2, diverticulitis with hx colectomy and colostomy, dCHF, and OSA who presented with cardiac arrest.       PRINCIPAL HOSPITAL DIAGNOSIS: Cardiac arrest    Discharge Diagnoses:   Cardiac arrest Initial rhythm unknown, presumed PEA arrest from respiratory failure.  CPR reportedly ~15 minutes before ROSC.  Not cooled.  Echocardiogram post-resuscitation showed preserved EF.    Acute on chronic respiratory failure with hypoxia and hypercapnia Presumed aspiration pneumonia/H. influenzae pneumonia OSA on CPAP at home, only nocturnal O2 at home COPD exacerbation Respiratory failure in setting of COPD, COPD exacerbation, and Xanax use.  Intubated on arrival, 4/24 >> extubated 4/28.  Blood cultures negative x4.  RVP negative.  SARS-CoV-2 testing was obtained for screening purposes.  COVID ruled out.  Tracheal aspirate with scant H flu.  Completed 5 days Cefepime.  Discharged on new home O2, prednisone taper.   Had right IJ tunneled catheter placed 5/8, CLipped to Misty Green. Misty Green.   Acute renal failure Developed renal failure due to cardiac arrest.  This was persistently oliguric during this  hospitalization.    Started HD on 4/28.     Acute metabolic encephalopathy MRI brain showed no stroke, no significant anoxic injury, however, she does have persistent cognitive impairment noted, which is presumed anoxic/ischemic in nature.  Her Xanax was tapered and her mentation improved somewhat.  Suspect this is new baseline, cognitive impairment/dementia. -She should continue to taper Xanax and STOP THIS COMPLETELY For sleep, could take melatonin, trazodone or acetaminophen  Septic shock vs cardiogenic shock Blood cultures negative.  Treated with 5 days cefepime for pneumonia.    Hypertension Started amlodipine 5 mg  Transaminitis Likely shock liver, resolved.  History of diverticulitis s/p Hartmann's with colostomy   Anxiety  Anemia of chronic disease No evidence of clinical bleeding.  Monitor Hgb as an outpatient.             Discharge Instructions  Discharge Instructions    Discharge instructions   Complete by:  As directed    New medicines include:  Amlodipine 5 mg daily for BP Prednisone taper for 8 days Nepro carb steady nutritional supplement Renvela for kidney disease Trazodone or Melatonin in place of Ambien as needed for sleep  STOP TAKING: Flexeril/cyclobenzaprine Lasix/furosemide Skelaxin/metaxolone Gabapentin/Neurontin Ambien/zolpidem  REDUCE YOUR DOSE:  Of Xanax (this impairs memory, and given your new memory issues from the cardiac arrest, it is too dangerous for you to take this medicine)  Follow up with Dr. Luan Pulling closely after discharge   Increase activity slowly   Complete by:  As directed      Allergies as of 05/27/2018      Reactions   Other Nausea And Vomiting   Anti-depressent that starts with t  Medication List    STOP taking these medications   cyclobenzaprine 5 MG tablet Commonly known as:  FLEXERIL   diphenhydrAMINE 50 MG capsule Commonly known as:  BENADRYL   furosemide 40 MG tablet Commonly  known as:  LASIX   gabapentin 300 MG capsule Commonly known as:  NEURONTIN   metaxalone 400 MG tablet Commonly known as:  SKELAXIN   ondansetron 4 MG disintegrating tablet Commonly known as:  Zofran ODT   ondansetron 8 MG tablet Commonly known as:  Zofran   pantoprazole 40 MG tablet Commonly known as:  PROTONIX   vitamin C 1000 MG tablet   zolpidem 10 MG tablet Commonly known as:  AMBIEN     TAKE these medications   acetaminophen 500 MG tablet Commonly known as:  TYLENOL Take 500 mg by mouth every 6 (six) hours as needed for mild pain or fever.   albuterol 108 (90 Base) MCG/ACT inhaler Commonly known as:  VENTOLIN HFA Inhale into the lungs every 6 (six) hours as needed for wheezing or shortness of breath.   ALPRAZolam 0.25 MG tablet Commonly known as:  XANAX Take 1 tablet (0.25 mg total) by mouth 3 (three) times daily. What changed:    medication strength  how much to take  when to take this   amLODipine 5 MG tablet Commonly known as:  NORVASC Take 1 tablet (5 mg total) by mouth daily. Start taking on:  May 28, 2018   docusate sodium 100 MG capsule Commonly known as:  COLACE Take 100 mg by mouth 2 (two) times daily.   escitalopram 10 MG tablet Commonly known as:  LEXAPRO Take 10 mg by mouth daily.   feeding supplement (NEPRO CARB STEADY) Liqd Take 237 mLs by mouth 3 (three) times daily between meals.   Fluticasone-Salmeterol 250-50 MCG/DOSE Aepb Commonly known as:  ADVAIR Inhale 1 puff into the lungs 2 (two) times daily.   Melatonin 3 MG Tabs Take 1 tablet (3 mg total) by mouth at bedtime as needed (sleep).   multivitamin with minerals tablet Take 1 tablet by mouth daily.   predniSONE 10 MG tablet Commonly known as:  DELTASONE Take 40 mg for 2 days then 30 mg for 2 days then 20 mg for 2 days then 10 mg for 2 days   roflumilast 500 MCG Tabs tablet Commonly known as:  DALIRESP Take 500 mcg by mouth daily.   sevelamer carbonate 800 MG  tablet Commonly known as:  RENVELA Take 1 tablet (800 mg total) by mouth 3 (three) times daily with meals.   tiotropium 18 MCG inhalation capsule Commonly known as:  SPIRIVA Place 18 mcg into inhaler and inhale daily.   traZODone 50 MG tablet Commonly known as:  DESYREL Take 1 tablet (50 mg total) by mouth at bedtime as needed for sleep.      Follow-up Information    Misty Du, MD Follow up.   Specialty:  Pulmonary Disease Why:  Call your Dr. Luan Pulling for a follow up appointment within 1 week after discharge from Rehab Contact information: [place] [address] [phone]          Allergies  Allergen Reactions  . Other Nausea And Vomiting    Anti-depressent that starts with t    Consultations:  CCM  Nephrology  IR   Procedures/Studies: Ct Head Wo Contrast  Result Date: 05/13/2018 CLINICAL DATA:  Altered level of consciousness.  Post CPR. EXAM: CT HEAD WITHOUT CONTRAST TECHNIQUE: Contiguous axial images were obtained  from the base of the skull through the vertex without intravenous contrast. COMPARISON:  None. FINDINGS: Brain: No acute intracranial abnormality. Specifically, no hemorrhage, hydrocephalus, mass lesion, acute infarction, or significant intracranial injury. Vascular: No hyperdense vessel or unexpected calcification. Skull: No acute calvarial abnormality. Sinuses/Orbits: Mucosal thickening throughout the ethmoid air cells. Air-fluid level in the right maxillary sinus. Mastoid air cells clear. Other: None IMPRESSION: No intracranial abnormality. Electronically Signed   By: Rolm Baptise M.D.   On: 05/13/2018 01:08   Mr Brain Wo Contrast  Result Date: 05/21/2018 CLINICAL DATA:  Concern for anoxic injury. EXAM: MRI HEAD WITHOUT CONTRAST TECHNIQUE: Multiplanar, multiecho pulse sequences of the brain and surrounding structures were obtained without intravenous contrast. COMPARISON:  05/14/2018 FINDINGS: Brain: No infarct is seen. On FLAIR  imaging and a few T2 weighted slices there is question of cortical edema, but no clear swelling/mass effect and no change in sulci appearance since admission head CT. Some of this FLAIR signal may be related to motion and oxygenation. No hydrocephalus, mass, collection, or blood products. Vascular: Major flow voids are preserved. Skull and upper cervical spine: Negative for marrow lesion Sinuses/Orbits: Opacified sphenoid sinuses and right maxillary fluid level. Patchy bilateral mastoid opacification. IMPRESSION: 1. Negative for infarct or definitive global anoxic injury. 2. Per chart a temporal lobe lesion is questioned but again not visualized. Contrast was not administered in the setting of profound acute renal failure. 3. Sinusitis. Electronically Signed   By: Monte Fantasia M.D.   On: 05/21/2018 15:28   Mr Brain Wo Contrast  Result Date: 05/14/2018 CLINICAL DATA:  Anoxic brain damage? Patient suffered cardiac arrest. EXAM: MRI HEAD WITHOUT CONTRAST TECHNIQUE: Multiplanar, multiecho pulse sequences of the brain and surrounding structures were obtained without intravenous contrast. COMPARISON:  CT head 05/13/2018. FINDINGS: Brain: No acute infarction, hemorrhage, hydrocephalus, extra-axial collection or mass lesion. Normal for age cerebral volume. No significant white matter disease. Symmetric FLAIR hyperintense subarachnoid spaces near the vertex related to hyperoxygenation from mechanical ventilation. Vascular: Normal flow voids. Skull and upper cervical spine: Normal marrow signal. Partial empty sella. Sinuses/Orbits: Diffuse sinus opacity, most notable in the RIGHT maxillary region, likely related to recumbency. Negative orbits. Other: BILATERAL mastoid effusions. IMPRESSION: MRI of the brain is negative for anoxic injury or acute infarction/hemorrhage. No evidence of increased intracranial pressure. No focal abnormalities are detected. Electronically Signed   By: Staci Righter M.D.   On: 05/14/2018  11:53   US Renal  Result Date: 05/15/2018 CLINICAL DATA:  Acute renal failure EXAM: RENAL / URINARY TRACT ULTRASOUND COMPLETE COMPARISON:  None. FINDINGS: Right Kidney: Renal measurements: 12.9 x 4.7 x 4.8 cm = volume: 151 mL . Echogenicity within normal limits. No mass or hydronephrosis visualized. Left Kidney: Renal measurements: 14 x 5.7 x 5.6 cm = volume: 231 mL. Echogenicity within normal limits. No mass or hydronephrosis visualized. Bladder: Decompressed with a Foley catheter present. IMPRESSION: Normal renal ultrasound. Electronically Signed   By: Kathreen Devoid   On: 05/15/2018 10:04   Ir Fluoro Guide Cv Line Right  Result Date: 05/26/2018 INDICATION: End-stage renal disease EXAM: ULTRASOUND GUIDANCE FOR VASCULAR ACCESS RIGHT INTERNAL JUGULAR PERMANENT HEMODIALYSIS CATHETER Date:  05/26/2018 05/26/2018 3:39 pm Radiologist:  Jerilynn Mages. Daryll Brod, MD Guidance:  Ultrasound fluoroscopic FLUOROSCOPY TIME:  Fluoroscopy Time: 0 minutes 30 seconds (1.2 mGy). MEDICATIONS: Ancef 2 g, administered within 1 hour of the procedure ANESTHESIA/SEDATION: Versed 1 mg IV; Fentanyl 25 mcg IV; Moderate Sedation Time:  12 minutes The patient was continuously monitored during  the procedure by the interventional radiology nurse under my direct supervision. CONTRAST:  None COMPLICATIONS: None immediate. PROCEDURE: Informed consent was obtained from the patient following explanation of the procedure, risks, benefits and alternatives. The patient understands, agrees and consents for the procedure. All questions were addressed. A time out was performed. Maximal barrier sterile technique utilized including caps, mask, sterile gowns, sterile gloves, large sterile drape, hand hygiene, and 2% chlorhexidine scrub. Under sterile conditions and local anesthesia, right internal jugular micropuncture venous access was performed with ultrasound. Images were obtained for documentation. A guide wire was inserted followed by a transitional dilator.  Next, a 0.035 guidewire was advanced into the IVC with a 5-French catheter. Measurements were obtained from the right venotomy site to the proximal right atrium. In the right infraclavicular chest, a subcutaneous tunnel was created under sterile conditions and local anesthesia. 1% lidocaine with epinephrine was utilized for this. The 23 cm tip to cuff dialysis catheter was tunneled subcutaneously to the venotomy site and inserted into the SVC/RA junction through a valved peel-away sheath. Position was confirmed with fluoroscopy. Images were obtained for documentation. Blood was aspirated from the catheter followed by saline and heparin flushes. The appropriate volume and strength of heparin was instilled in each lumen. Caps were applied. The catheter was secured at the tunnel site with Gelfoam and a pursestring suture. The venotomy site was closed with subcuticular Vicryl suture. Dermabond was applied to the small right neck incision. A dry sterile dressing was applied. The catheter is ready for use. No immediate complications. IMPRESSION: Ultrasound and fluoroscopically guided right internal jugular tunneled hemodialysis catheter (23 cm tip to cuff dialysis catheter). Electronically Signed   By: Jerilynn Mages.  Shick M.D.   On: 05/26/2018 16:18   Ir US Guide Vasc Access Right  Result Date: 05/26/2018 INDICATION: End-stage renal disease EXAM: ULTRASOUND GUIDANCE FOR VASCULAR ACCESS RIGHT INTERNAL JUGULAR PERMANENT HEMODIALYSIS CATHETER Date:  05/26/2018 05/26/2018 3:39 pm Radiologist:  Jerilynn Mages. Daryll Brod, MD Guidance:  Ultrasound fluoroscopic FLUOROSCOPY TIME:  Fluoroscopy Time: 0 minutes 30 seconds (1.2 mGy). MEDICATIONS: Ancef 2 g, administered within 1 hour of the procedure ANESTHESIA/SEDATION: Versed 1 mg IV; Fentanyl 25 mcg IV; Moderate Sedation Time:  12 minutes The patient was continuously monitored during the procedure by the interventional radiology nurse under my direct supervision. CONTRAST:  None COMPLICATIONS: None  immediate. PROCEDURE: Informed consent was obtained from the patient following explanation of the procedure, risks, benefits and alternatives. The patient understands, agrees and consents for the procedure. All questions were addressed. A time out was performed. Maximal barrier sterile technique utilized including caps, mask, sterile gowns, sterile gloves, large sterile drape, hand hygiene, and 2% chlorhexidine scrub. Under sterile conditions and local anesthesia, right internal jugular micropuncture venous access was performed with ultrasound. Images were obtained for documentation. A guide wire was inserted followed by a transitional dilator. Next, a 0.035 guidewire was advanced into the IVC with a 5-French catheter. Measurements were obtained from the right venotomy site to the proximal right atrium. In the right infraclavicular chest, a subcutaneous tunnel was created under sterile conditions and local anesthesia. 1% lidocaine with epinephrine was utilized for this. The 23 cm tip to cuff dialysis catheter was tunneled subcutaneously to the venotomy site and inserted into the SVC/RA junction through a valved peel-away sheath. Position was confirmed with fluoroscopy. Images were obtained for documentation. Blood was aspirated from the catheter followed by saline and heparin flushes. The appropriate volume and strength of heparin was instilled in each lumen. Caps  were applied. The catheter was secured at the tunnel site with Gelfoam and a pursestring suture. The venotomy site was closed with subcuticular Vicryl suture. Dermabond was applied to the small right neck incision. A dry sterile dressing was applied. The catheter is ready for use. No immediate complications. IMPRESSION: Ultrasound and fluoroscopically guided right internal jugular tunneled hemodialysis catheter (23 cm tip to cuff dialysis catheter). Electronically Signed   By: Jerilynn Mages.  Shick M.D.   On: 05/26/2018 16:18   Dg Chest Port 1 View  Result Date:  05/24/2018 CLINICAL DATA:  Hypoxia   Patient a little AMS EXAM: PORTABLE CHEST - 1 VIEW COMPARISON:  05/18/2018 FINDINGS: Patient has been extubated, and the gastric tube removed. Right IJ dialysis catheter stable. Left infrahilar interstitial opacity stable. Improvement in mild edema or infiltrates. Stable calcified upper lobe granulomas Heart size upper limits normal for technique. No effusion. No pneumothorax. Visualized bones unremarkable. IMPRESSION: Extubation with improvement in bilateral edema/infiltrates. Electronically Signed   By: Lucrezia Europe M.D.   On: 05/24/2018 08:37   Dg Chest Port 1 View  Result Date: 05/18/2018 CLINICAL DATA:  Respiratory failure. EXAM: PORTABLE CHEST 1 VIEW COMPARISON:  Radiograph of May 16, 2018. FINDINGS: The heart size and mediastinal contours are within normal limits. Endotracheal and nasogastric tubes are unchanged in position. Right internal jugular catheter is unchanged in position. No pneumothorax is noted. Stable coarse interstitial densities are noted throughout both lungs. Stable left basilar subsegmental atelectasis. The visualized skeletal structures are unremarkable. IMPRESSION: Stable support apparatus. Stable mild left basilar subsegmental atelectasis. Electronically Signed   By: Marijo Conception M.D.   On: 05/18/2018 07:20   Dg Chest Port 1 View  Result Date: 05/16/2018 CLINICAL DATA:  Central line placement. EXAM: PORTABLE CHEST 1 VIEW COMPARISON:  Chest x-ray from same day at 5:09 a.m. FINDINGS: Interval placement of a right internal jugular central venous catheter with the tip in the distal SVC. Unchanged endotracheal and enteric tubes. The heart size and mediastinal contours are within normal limits. Normal pulmonary vascularity. Chronically coarsened interstitial markings are similar to prior studies and likely smoking-related. Unchanged mild left basilar atelectasis. No focal consolidation, pleural effusion, or pneumothorax. No acute osseous  abnormality. IMPRESSION: 1. New right internal jugular central venous catheter with tip in the distal SVC. No pneumothorax. Electronically Signed   By: Titus Dubin M.D.   On: 05/16/2018 10:57   Dg Chest Port 1 View  Result Date: 05/16/2018 CLINICAL DATA:  Respiratory failure. EXAM: PORTABLE CHEST 1 VIEW COMPARISON:  Radiograph of May 15, 2018. FINDINGS: Stable cardiomediastinal silhouette. Endotracheal and nasogastric tubes are unchanged in position. No pneumothorax or pleural effusion is noted. Lungs are clear. Bony thorax is unremarkable. IMPRESSION: Stable support apparatus. No acute cardiopulmonary abnormality seen. Electronically Signed   By: Marijo Conception M.D.   On: 05/16/2018 07:05   Dg Chest Port 1 View  Result Date: 05/15/2018 CLINICAL DATA:  Respiratory failure. EXAM: PORTABLE CHEST 1 VIEW COMPARISON:  Radiograph of May 14, 2018. FINDINGS: Endotracheal and nasogastric tubes are unchanged in position. No pneumothorax is noted. Stable bilateral calcified granulomas are noted. Stable bibasilar opacities are noted concerning for subsegmental atelectasis or possibly infiltrates. Bony thorax is unremarkable. IMPRESSION: Stable support apparatus. Stable bibasilar opacities as described above. Electronically Signed   By: Marijo Conception M.D.   On: 05/15/2018 07:41   Dg Chest Port 1 View  Result Date: 05/14/2018 CLINICAL DATA:  Respiratory failure. EXAM: PORTABLE CHEST 1 VIEW COMPARISON:  May 13, 2018 FINDINGS: The ETT is in good position. The NG tube terminates below today's film. No pneumothorax. Mild opacity in the bases. No other acute abnormalities. IMPRESSION: 1. Support apparatus as above. 2. Mild opacity in the bases may represent atelectasis. Early infiltrates not excluded. Recommend attention on follow-up. Electronically Signed   By: Dorise Bullion III M.D   On: 05/14/2018 08:41   Dg Chest Port 1 View  Result Date: 05/13/2018 CLINICAL DATA:  Ventilator dependent respiratory  failure. EXAM: PORTABLE CHEST 1 VIEW COMPARISON:  05/12/2018, 01/08/2018 and earlier. FINDINGS: Endotracheal tube tip in satisfactory position projecting approximately 6 mm above the carina. Gastric tube courses below the diaphragm into the stomach. External pacing pads. Cardiac silhouette normal in size, unchanged. Stable mild pulmonary venous hypertension. Stable chronic coarse interstitial lung disease and numerous calcified granulomata throughout both lungs. No new pulmonary parenchymal abnormalities. IMPRESSION: 1. Support apparatus satisfactory. 2. Stable chronic interstitial lung disease and numerous calcified granulomata throughout both lungs. 3. No new/acute abnormalities. Electronically Signed   By: Evangeline Dakin M.D.   On: 05/13/2018 13:12   Dg Chest Port 1 View  Result Date: 05/12/2018 CLINICAL DATA:  Respiratory failure EXAM: PORTABLE CHEST 1 VIEW COMPARISON:  01/08/2018, 03/21/2016 FINDINGS: Endotracheal tube tip is about 29 mm superior to the carina. Esophageal tube tip below the diaphragm but is non included. Borderline cardiomegaly. Chronic interstitial opacity at the bases. No acute consolidation. Aortic atherosclerosis. No pneumothorax. Calcified nodules in the bilateral lung IMPRESSION: 1. Endotracheal tube tip about 29 mm superior to carina. Esophageal tube tip below the diaphragm but incompletely included 2. Hyperinflated lungs with chronic interstitial opacity at the bases. No acute airspace disease Electronically Signed   By: Donavan Foil M.D.   On: 05/12/2018 22:29   Echbocacardiogram 4/30 IMPRESSIONS    1. The left ventricle has normal systolic function with an ejection fraction of 60-65%. The cavity size was normal. Left ventricular diastolic function could not be evaluated.  2. The right ventricle has normal systolic function. The cavity was normal. There is no increase in right ventricular wall thickness.  3. The tricuspid valve is grossly normal.  4. The aortic valve  is grossly normal.  5. The aortic root and ascending aorta are normal in size and structure.  6. The interatrial septum was not assessed.       Subjective: No complaints.  No dyspnea, no cough, no confusion, no fever.  Discharge Exam: Vitals:   05/27/18 0819 05/27/18 0909  BP:  (!) 145/70  Pulse:    Resp:    Temp:    SpO2: 94%    Vitals:   05/26/18 2256 05/27/18 0621 05/27/18 0819 05/27/18 0909  BP: (!) 145/62 (!) 163/94  (!) 145/70  Pulse: 70 88    Resp: 20 16    Temp: 97.9 F (36.6 C) 98 F (36.7 C)    TempSrc: Oral Oral    SpO2: 99% 92% 94%   Weight:  53.5 kg    Height:        General: Pt is alert, awake, not in acute distress, sitting in recliner Cardiovascular: RRR, nl S1-S2, no murmurs appreciated.   No LE edema.   Respiratory: On nasal cannula.  Normal respiratory rate and rhythm. Lung sounds diminished, no wheezing noted. Abdominal: Abdomen soft and non-tender.  No distension or HSM.   Neuro/Psych: Strength symmetric in upper and lower extremities.  Judgment and insight appear moderately impaired.   The results of significant diagnostics from this hospitalization (  including imaging, microbiology, ancillary and laboratory) are listed below for reference.     Microbiology: Recent Results (from the past 240 hour(s))  Culture, blood (routine x 2)     Status: None   Collection Time: 05/21/18 11:25 AM  Result Value Ref Range Status   Specimen Description BLOOD LEFT HAND  Final   Special Requests   Final    BOTTLES DRAWN AEROBIC ONLY Blood Culture adequate volume   Culture   Final    NO GROWTH 5 DAYS Performed at [facility], [address] [place], [place] [address]    Report Status 05/26/2018 FINAL  Final  Culture, blood (routine x 2)     Status: None   Collection Time: 05/21/18 11:36 AM  Result Value Ref Range Status   Specimen Description BLOOD RIGHT HAND  Final   Special Requests   Final    BOTTLES DRAWN AEROBIC ONLY Blood Culture adequate  volume   Culture   Final    NO GROWTH 5 DAYS Performed at [facility], [place] [address] [place],  [address]    Report Status 05/26/2018 FINAL  Final     Labs: BNP (last 3 results) No results for input(s): BNP in the last 8760 hours. Basic Metabolic Panel: Recent Labs  Lab 05/21/18 0430 05/22/18 0337 05/23/18 0437 05/24/18 0444 05/25/18 0426 05/26/18 0443 05/27/18 0521  NA 139 138 137 137 138 137 135  K 4.1 5.1 4.0 3.9 4.1 4.0 3.7  CL 96* 99 97* 93* 98 97* 93*  CO2 28 26 25 27 27 27 25   GLUCOSE 75 73 80 93 79 91 96  BUN 45* 65* 37* 53* 64* 53* 20  CREATININE 3.79* 5.19* 4.16* 5.48* 6.52* 5.64* 2.86*  CALCIUM 8.8* 8.2* 9.0 8.9 8.9 8.9 8.8*  MG 2.1 2.2 2.1 2.2 2.3  --   --    Liver Function Tests: Recent Labs  Lab 05/21/18 0430  AST 55*  ALT 435*  ALKPHOS 68  BILITOT 0.7  PROT 5.8*  ALBUMIN 2.7*   No results for input(s): LIPASE, AMYLASE in the last 168 hours. Recent Labs  Lab 05/20/18 1231  AMMONIA 35   CBC: Recent Labs  Lab 05/22/18 0337 05/23/18 0437 05/24/18 0444 05/25/18 0426 05/27/18 0521  WBC 12.7* 11.4* 9.7 10.4 10.1  HGB 10.0* 10.3* 9.7* 9.8* 10.6*  HCT 31.2* 33.0* 30.7* 31.7* 33.6*  MCV 94.8 95.4 93.6 94.9 93.3  PLT 180 214 212 244 202   Cardiac Enzymes: No results for input(s): CKTOTAL, CKMB, CKMBINDEX, TROPONINI in the last 168 hours. BNP: Invalid input(s): POCBNP CBG: Recent Labs  Lab 05/26/18 0404 05/26/18 0730 05/26/18 1237 05/26/18 1616 05/27/18 0759  GLUCAP 84 80 95 112* 95   D-Dimer No results for input(s): DDIMER in the last 72 hours. Hgb A1c No results for input(s): HGBA1C in the last 72 hours. Lipid Profile No results for input(s): CHOL, HDL, LDLCALC, TRIG, CHOLHDL, LDLDIRECT in the last 72 hours. Thyroid function studies No results for input(s): TSH, T4TOTAL, T3FREE, THYROIDAB in the last 72 hours.  Invalid input(s): FREET3 Anemia work up No results for input(s): VITAMINB12, FOLATE, FERRITIN, TIBC,  IRON, RETICCTPCT in the last 72 hours. Urinalysis    Component Value Date/Time   COLORURINE YELLOW 05/21/2018 0901   APPEARANCEUR HAZY (A) 05/21/2018 0901   LABSPEC 1.009 05/21/2018 0901   PHURINE 6.0 05/21/2018 0901   GLUCOSEU NEGATIVE 05/21/2018 0901   HGBUR SMALL (A) 05/21/2018 0901   BILIRUBINUR NEGATIVE 05/21/2018  [place] 05/21/2018 0901   PROTEINUR 100 (A) 05/21/2018 0901   NITRITE NEGATIVE 05/21/2018 0901   LEUKOCYTESUR NEGATIVE 05/21/2018 0901   Sepsis Labs Invalid input(s): PROCALCITONIN,  WBC,  LACTICIDVEN Microbiology Recent Results (from the past 240 hour(s))  Culture, blood (routine x 2)     Status: None   Collection Time: 05/21/18 11:25 AM  Result Value Ref Range Status   Specimen Description BLOOD LEFT HAND  Final   Special Requests   Final    BOTTLES DRAWN AEROBIC ONLY Blood Culture adequate volume   Culture   Final    NO GROWTH 5 DAYS Performed at [facility], [place] [address], [place], [place] [address]    Report Status 05/26/2018 FINAL  Final  Culture, blood (routine x 2)     Status: None   Collection Time: 05/21/18 11:36 AM  Result Value Ref Range Status   Specimen Description BLOOD RIGHT HAND  Final   Special Requests   Final    BOTTLES DRAWN AEROBIC ONLY Blood Culture adequate volume   Culture   Final    NO GROWTH 5 DAYS Performed at [facility], [place] [address] [place], [place] [address]    Report Status 05/26/2018 FINAL  Final     Time coordinating discharge: 40 minutes       SIGNED:   Edwin Dada, MD  [facility] 05/27/2018, 11:32 AM

## 2018-05-28 ENCOUNTER — Inpatient Hospital Stay (HOSPITAL_COMMUNITY): Payer: 59

## 2018-05-28 DIAGNOSIS — J410 Simple chronic bronchitis: Secondary | ICD-10-CM

## 2018-05-28 LAB — BASIC METABOLIC PANEL
Anion gap: 17 — ABNORMAL HIGH (ref 5–15)
BUN: 20 mg/dL (ref 6–20)
CO2: 25 mmol/L (ref 22–32)
Calcium: 8.8 mg/dL — ABNORMAL LOW (ref 8.9–10.3)
Chloride: 93 mmol/L — ABNORMAL LOW (ref 98–111)
Creatinine, Ser: 2.86 mg/dL — ABNORMAL HIGH (ref 0.44–1.00)
GFR calc Af Amer: 22 mL/min — ABNORMAL LOW (ref >60)
GFR calc non Af Amer: 19 mL/min — ABNORMAL LOW (ref >60)
Glucose, Bld: 96 mg/dL (ref 70–99)
Potassium: 3.7 mmol/L (ref 3.5–5.1)
Sodium: 135 mmol/L (ref 135–145)

## 2018-05-28 MED ORDER — QUETIAPINE FUMARATE 25 MG PO TABS
25.0000 mg | ORAL_TABLET | Freq: Every day | ORAL | Status: DC
  Administered 2018-05-28 – 2018-06-04 (×8): 25 mg via ORAL
  Filled 2018-05-28 (×9): qty 1

## 2018-05-28 MED ORDER — PREDNISONE 20 MG PO TABS
40.0000 mg | ORAL_TABLET | Freq: Every day | ORAL | Status: DC
  Administered 2018-05-29: 09:00:00 40 mg via ORAL
  Filled 2018-05-28: qty 2

## 2018-05-28 MED ORDER — TRAMADOL HCL 50 MG PO TABS
25.0000 mg | ORAL_TABLET | Freq: Two times a day (BID) | ORAL | Status: DC | PRN
  Administered 2018-05-28 – 2018-06-01 (×8): 25 mg via ORAL
  Filled 2018-05-28 (×10): qty 1

## 2018-05-28 MED ORDER — AMLODIPINE BESYLATE 10 MG PO TABS
10.0000 mg | ORAL_TABLET | Freq: Every day | ORAL | Status: DC
  Administered 2018-05-29 – 2018-06-05 (×8): 10 mg via ORAL
  Filled 2018-05-28 (×8): qty 1

## 2018-05-28 NOTE — Progress Notes (Addendum)
[facility] PHYSICAL MEDICINE & REHABILITATION PROGRESS NOTE   Subjective/Complaints: Pt had restless night. Taking tylenol q4, anxious. Up with OT  ROS: Patient denies fever, rash, sore throat, blurred vision, nausea, vomiting, diarrhea, cough, shortness of breath or chest pain, joint or back pain, headache   Objective:   Ir Fluoro Guide Cv Line Right  Result Date: 05/26/2018 INDICATION: End-stage renal disease EXAM: ULTRASOUND GUIDANCE FOR VASCULAR ACCESS RIGHT INTERNAL JUGULAR PERMANENT HEMODIALYSIS CATHETER Date:  05/26/2018 05/26/2018 3:39 pm Radiologist:  Jerilynn Mages. Daryll Brod, MD Guidance:  Ultrasound fluoroscopic FLUOROSCOPY TIME:  Fluoroscopy Time: 0 minutes 30 seconds (1.2 mGy). MEDICATIONS: Ancef 2 g, administered within 1 hour of the procedure ANESTHESIA/SEDATION: Versed 1 mg IV; Fentanyl 25 mcg IV; Moderate Sedation Time:  12 minutes The patient was continuously monitored during the procedure by the interventional radiology nurse under my direct supervision. CONTRAST:  None COMPLICATIONS: None immediate. PROCEDURE: Informed consent was obtained from the patient following explanation of the procedure, risks, benefits and alternatives. The patient understands, agrees and consents for the procedure. All questions were addressed. A time out was performed. Maximal barrier sterile technique utilized including caps, mask, sterile gowns, sterile gloves, large sterile drape, hand hygiene, and 2% chlorhexidine scrub. Under sterile conditions and local anesthesia, right internal jugular micropuncture venous access was performed with ultrasound. Images were obtained for documentation. A guide wire was inserted followed by a transitional dilator. Next, a 0.035 guidewire was advanced into the IVC with a 5-French catheter. Measurements were obtained from the right venotomy site to the proximal right atrium. In the right infraclavicular chest, a subcutaneous tunnel was created under sterile conditions and local  anesthesia. 1% lidocaine with epinephrine was utilized for this. The 23 cm tip to cuff dialysis catheter was tunneled subcutaneously to the venotomy site and inserted into the SVC/RA junction through a valved peel-away sheath. Position was confirmed with fluoroscopy. Images were obtained for documentation. Blood was aspirated from the catheter followed by saline and heparin flushes. The appropriate volume and strength of heparin was instilled in each lumen. Caps were applied. The catheter was secured at the tunnel site with Gelfoam and a pursestring suture. The venotomy site was closed with subcuticular Vicryl suture. Dermabond was applied to the small right neck incision. A dry sterile dressing was applied. The catheter is ready for use. No immediate complications. IMPRESSION: Ultrasound and fluoroscopically guided right internal jugular tunneled hemodialysis catheter (23 cm tip to cuff dialysis catheter). Electronically Signed   By: Jerilynn Mages.  Shick M.D.   On: 05/26/2018 16:18   Ir US Guide Vasc Access Right  Result Date: 05/26/2018 INDICATION: End-stage renal disease EXAM: ULTRASOUND GUIDANCE FOR VASCULAR ACCESS RIGHT INTERNAL JUGULAR PERMANENT HEMODIALYSIS CATHETER Date:  05/26/2018 05/26/2018 3:39 pm Radiologist:  Jerilynn Mages. Daryll Brod, MD Guidance:  Ultrasound fluoroscopic FLUOROSCOPY TIME:  Fluoroscopy Time: 0 minutes 30 seconds (1.2 mGy). MEDICATIONS: Ancef 2 g, administered within 1 hour of the procedure ANESTHESIA/SEDATION: Versed 1 mg IV; Fentanyl 25 mcg IV; Moderate Sedation Time:  12 minutes The patient was continuously monitored during the procedure by the interventional radiology nurse under my direct supervision. CONTRAST:  None COMPLICATIONS: None immediate. PROCEDURE: Informed consent was obtained from the patient following explanation of the procedure, risks, benefits and alternatives. The patient understands, agrees and consents for the procedure. All questions were addressed. A time out was performed. Maximal  barrier sterile technique utilized including caps, mask, sterile gowns, sterile gloves, large sterile drape, hand hygiene, and 2% chlorhexidine scrub. Under sterile conditions and local  anesthesia, right internal jugular micropuncture venous access was performed with ultrasound. Images were obtained for documentation. A guide wire was inserted followed by a transitional dilator. Next, a 0.035 guidewire was advanced into the IVC with a 5-French catheter. Measurements were obtained from the right venotomy site to the proximal right atrium. In the right infraclavicular chest, a subcutaneous tunnel was created under sterile conditions and local anesthesia. 1% lidocaine with epinephrine was utilized for this. The 23 cm tip to cuff dialysis catheter was tunneled subcutaneously to the venotomy site and inserted into the SVC/RA junction through a valved peel-away sheath. Position was confirmed with fluoroscopy. Images were obtained for documentation. Blood was aspirated from the catheter followed by saline and heparin flushes. The appropriate volume and strength of heparin was instilled in each lumen. Caps were applied. The catheter was secured at the tunnel site with Gelfoam and a pursestring suture. The venotomy site was closed with subcuticular Vicryl suture. Dermabond was applied to the small right neck incision. A dry sterile dressing was applied. The catheter is ready for use. No immediate complications. IMPRESSION: Ultrasound and fluoroscopically guided right internal jugular tunneled hemodialysis catheter (23 cm tip to cuff dialysis catheter). Electronically Signed   By: Jerilynn Mages.  Shick M.D.   On: 05/26/2018 16:18   Recent Labs    05/27/18 0521  WBC 10.1  HGB 10.6*  HCT 33.6*  PLT 202   Recent Labs    05/26/18 0443 05/27/18 0521  NA 137 135  K 4.0 3.7  CL 97* 93*  CO2 27 25  GLUCOSE 91 96  BUN 53* 20  CREATININE 5.64* 2.86*  CALCIUM 8.9 8.8*    Intake/Output Summary (Last 24 hours) at 05/28/2018  0931 Last data filed at 05/28/2018 0455 Gross per 24 hour  Intake 480 ml  Output -  Net 480 ml     Physical Exam: Vital Signs Blood pressure (!) 149/73, pulse 100, temperature 98.5 F (36.9 C), temperature source Oral, resp. rate 18, height 5\' 1"  (1.549 m), weight 79.7 kg, SpO2 96 %.  Constitutional: No distress . Vital signs reviewed. HEENT: EOMI, oral membranes moist Neck: supple Cardiovascular: RRR without murmur. No JVD    Respiratory: CTA Bilaterally without wheezes or rales. Normal effort    GI: BS +, non-tender, non-distended   Musculoskeletal:Normal range of motion.  General: No edema.  Neurological: She isalert. Oriented to cone/gso, month, year, and almost day. Restless.  Follows basic commands. Moves all 4's 4- /5. Senses pain in all 4's. Skin: Skin iswarm. Catheter in place on chest wall. Bruising on abdomen Psychiatric: Generally pleasant   Assessment/Plan: 1. Functional deficits secondary to anoxic encephalopathy, debility which require 3+ hours per day of interdisciplinary therapy in a comprehensive inpatient rehab setting.  Physiatrist is providing close team supervision and 24 hour management of active medical problems listed below.  Physiatrist and rehab team continue to assess barriers to discharge/monitor patient progress toward functional and medical goals  Care Tool:  Bathing              Bathing assist       Upper Body Dressing/Undressing Upper body dressing        Upper body assist      Lower Body Dressing/Undressing Lower body dressing            Lower body assist       Toileting Toileting    Toileting assist Assist for toileting: Moderate Assistance - Patient 50 - 74%     Transfers  Chair/bed transfer  Transfers assist           Locomotion Ambulation   Ambulation assist              Walk 10 feet activity   Assist           Walk 50 feet activity   Assist           Walk 150  feet activity   Assist           Walk 10 feet on uneven surface  activity   Assist           Wheelchair     Assist               Wheelchair 50 feet with 2 turns activity    Assist            Wheelchair 150 feet activity     Assist          Medical Problem List and Plan: 1.Anoxic encephalopath and debilitysecondary tocardiac arrest from acute on chronic respiratory failure. -Patient is beginning CIR therapies today including PT and OT  2. Antithrombotics: -DVT/anticoagulation:Pharmaceutical:Heparin -antiplatelet therapy: N/A 3. Pain Management:Tylenol prn  -anxiety mgt should help also 4. Mood:LCSW to follow for evaluation and support. -antipsychotic agents: add low dose seroquel at night to help with restlessness and sleep 5. Neuropsych: This patientis notcapable of making decisions on hisown behalf. 6. Skin/Wound Care:routine pressure relief measures. 7. Fluids/Electrolytes/Nutrition:Strict I/O. Renal diet with 1200 cc FR.    -dietary ed 8. COPD with acute on chronic respiratory failure:   -oxygen via [place] to keep sats 90%  -Encourage pulmonary hygiene/flutter valve use. Continue  duonebs tid.   -iv out, dc solumedrol, change to prednisone taper, 40mg  daily today then decrease q2 days to off -simplify regimen as possible 9. OSA:Now requires BIPAPwith oxygen at nights.  10 Spinal stenosis/chronic pain:Used tylenol prn at home. 11. Acute renal failure/HD dependent: HD to continue  -Cr with big drop yesterday but minimal UO  -continue HD per nephro on Monday 12. HTN: Controlled on Norvasc. Monitor BID. 13. Anemia of chronic disease:Stable overall. Continue to monitor 14. Anxiety d/o: On Lexapro with alprazolam tid.    LOS: 1 days A FACE TO [place] 05/28/2018, 9:31 AM

## 2018-05-28 NOTE — Evaluation (Signed)
Occupational Therapy Assessment and Plan  Patient Details  Name: Misty Green MRN: [id number] Date of Birth: December 15, 1970  OT Diagnosis: acute pain, cognitive deficits and muscle weakness (generalized) Rehab Potential: Rehab Potential (ACUTE ONLY): Good ELOS: 5-7 days   Today's Date: 05/28/2018 OT Individual Time: 9798-9211 OT Individual Time Calculation (min): 70 min     Problem List:  Patient Active Problem List   Diagnosis Date Noted  . Hypoxic encephalopathy ([place]) 05/27/2018  . Anoxic brain injury ([place])   . ARF (acute renal failure) ([place])   . Lactic acidosis   . Transaminitis   . Acute on chronic respiratory failure with hypercapnia ([place])   . Cardiac arrest ([place]) 05/12/2018  . Diverticulitis of colon (without mention of hemorrhage)(562.11)   . Acute diverticulitis 01/06/2018  . Mixed anxiety and depressive disorder 12/22/2017  . Obesity 12/22/2017  . Tobacco user 12/22/2017  . Diverticulitis 12/03/2017  . Spinal stenosis of lumbar region 04/12/2016  . Acute respiratory failure ([place]) 03/22/2016  . COPD exacerbation ([place]) 03/22/2016  . Chronic back pain 03/22/2016  . Opioid type dependence, abuse ([place]) 03/22/2016  . Polycythemia 03/22/2016  . Anxiety 03/22/2016  . OSA on CPAP 03/22/2016  . Closed fracture of distal end of right radius 08/25/2015  . Closed nondisplaced fracture of styloid process of right ulna 08/25/2015    Past Medical History:  Past Medical History:  Diagnosis Date  . CHF (congestive heart failure) ([place])   . COPD (chronic obstructive pulmonary disease) ([place])   . Current smoker   . Opiate abuse, continuous ([place])    Past Surgical History:  Past Surgical History:  Procedure Laterality Date  . ABDOMINAL HYSTERECTOMY    . BACK SURGERY    . cesction    . COLOSTOMY  01/13/2018   Procedure: COLOSTOMY Creation;  Surgeon: Jules Husbands, MD;  Location: [facility] ORS;  Service: General;;  . INCISIONAL HERNIA REPAIR     lower midline laparotomy incision  (hysterectomy), repaired with mesh  . IR FLUORO GUIDE CV LINE RIGHT  05/26/2018  . IR US GUIDE VASC ACCESS RIGHT  05/26/2018  . LAPAROSCOPIC CHOLECYSTECTOMY  2012  . LAPAROSCOPIC LYSIS OF ADHESIONS  01/13/2018   Procedure: LAPAROSCOPIC LYSIS OF ADHESIONS;  Surgeon: Jules Husbands, MD;  Location: [facility] ORS;  Service: General;;  . LAPAROSCOPIC SIGMOID COLECTOMY  01/13/2018   Procedure: LAPAROSCOPIC SIGMOID COLECTOMY;  Surgeon: Jules Husbands, MD;  Location: [facility] ORS;  Service: General;;  . ORIF WRIST FRACTURE Right 08/25/2015   Procedure: OPEN REDUCTION INTERNAL FIXATION (ORIF) WRIST FRACTURE;  Surgeon: Corky Mull, MD;  Location: [facility] ORS;  Service: Orthopedics;  Laterality: Right;    Assessment & Plan Clinical Impression: Misty Green is a 48 year old female with history of COPD, OSA- 3 L oxygen at nights, CHF, spinal stenosis who was admitted on 05/12/18 after bing found unresponsive and required 13-14 minutes of CPR by EMS. Per reports, patient had been feeling bad since 4/19 and had been sleeping for most of the day with husband checking in on her every 30-45 minutes. She was intubated in ED and started on epinephrine. She was started on IV antibiotics and steroids for acute hypoxic/hypercapneic failure with presumed aspiration event. 2 D echo showed EF 60-65% with normal systolic function. EEG done due to encephalopathy and showed discontinuous generalized background slowing. Neurology expressed concerns about recovery from hypoxic injury and CT/MRI head were negative for acute changes or anoxic injury. She developed oliguric renal failure with rise in  SCr due to AKI and was started on HD on 4/28. She was extubated to BIPAP on 4/30 and has had issues with agitation. She has had poor renal recovery and temporary HD catheter changed to tunneled IJ by Dr. Annamaria Boots on 5/8. Mentation improving but she continues to be limited by hypoxia, debility and cognitive deficits. Repeat MRI brain 5/5 was negative for  infarct or global anoxic injury. CIR recommended due to functional decline.   Patient transferred to CIR on 05/27/2018 .    Patient currently requires min with basic self-care skills secondary to muscle weakness, decreased cardiorespiratoy endurance, decreased initiation, decreased attention, decreased awareness, decreased problem solving, decreased safety awareness, decreased memory and delayed processing and decreased standing balance and decreased postural control.  Prior to hospitalization, patient could complete ADLs with independent .  Patient will benefit from skilled intervention to decrease level of assist with basic self-care skills prior to discharge home with care partner.  Anticipate patient will require 24 hour supervision and follow up home health.  OT - End of Session Activity Tolerance: Tolerates 10 - 20 min activity with multiple rests Endurance Deficit: Yes Endurance Deficit Description: generalized deconditioning OT Assessment Rehab Potential (ACUTE ONLY): Good OT Patient demonstrates impairments in the following area(s): Balance;Motor;Cognition;Pain;Endurance;Safety OT Basic ADL's Functional Problem(s): Bathing;Dressing;Toileting OT Advanced ADL's Functional Problem(s): Simple Meal Preparation OT Transfers Functional Problem(s): Toilet;Tub/Shower OT Additional Impairment(s): None OT Plan OT Intensity: Minimum of 1-2 x/day, 45 to 90 minutes OT Frequency: 5 out of 7 days OT Duration/Estimated Length of Stay: 5-7 days OT Treatment/Interventions: Balance/vestibular training;Community reintegration;Disease mangement/prevention;Patient/family education;Self Care/advanced ADL retraining;Therapeutic Exercise;UE/LE Coordination activities;Cognitive remediation/compensation;Discharge planning;DME/adaptive equipment instruction;Functional mobility training;Psychosocial support;Pain management;Skin care/wound managment;Therapeutic Activities;UE/LE Strength taining/ROM OT Self Feeding  Anticipated Outcome(s): no goal set OT Basic Self-Care Anticipated Outcome(s): (S) OT Toileting Anticipated Outcome(s): (S) OT Bathroom Transfers Anticipated Outcome(s): (S) OT Recommendation Recommendations for Other Services: Speech consult Patient destination: Home Follow Up Recommendations: Home health OT Equipment Recommended: Tub/shower bench   Skilled Therapeutic Intervention Skilled OT evaluation initiated. Education provided on OT POC, ELOS, condition insight, rehab expectations, and fall prevention strategies. Pt completed short distance functional mobility in room with min A, fading to CGA. Cueing required re safety awareness and RW management. Pt completed UB bathing and dressing at sink with set up assist and (S). Pt required CGA for LB bathing and dressing. On RA pt saturation at 92%, with 2 LNC pt at 95%. Pt completed functional mobility into bathroom and completed toilet transfer with CGA. Attempted to assist pt in washing hair however hair too matted to complete. Pt set up to eat breakfast and briefly supervised to ensure safety. Pt left sitting up with all needs met, chair alarm set and all needs within reach. Pt c/o chest soreness from compressions and RN alerted.   OT Evaluation Precautions/Restrictions  Precautions Precautions: Fall Precaution Comments: on 3L 02 at night at baseline, ostomy Restrictions Weight Bearing Restrictions: No General Chart Reviewed: Yes Family/Caregiver Present: No Vital Signs Oxygen Therapy SpO2: 96 % O2 Device: Nasal Cannula O2 Flow Rate (L/min): 2 L/min Pain Pain Assessment Pain Scale: 0-10 Pain Score: 8  Pain Type: Acute pain Pain Location: Chest Pain Orientation: Upper Pain Descriptors / Indicators: Aching;Sore Pain Onset: On-going Pain Intervention(s): RN made aware Home Living/Prior [place] expects to be discharged to:: Private residence Living Arrangements: Spouse/significant  other Available Help at Discharge: Family Type of Home: House Home Access: Stairs to enter [employer] of Steps: 5 back; 4 in  front Entrance Stairs-Rails: Can reach both Home Layout: One level Bathroom Shower/Tub: Chiropodist: Handicapped height  Lives With: Spouse IADL History Homemaking Responsibilities: Yes Meal Prep Responsibility: Primary Laundry Responsibility: Primary Cleaning Responsibility: Primary Bill Paying/Finance Responsibility: Secondary Shopping Responsibility: Secondary Child Care Responsibility: Primary Homemaking Comments: babysits grandkids Current License: Yes Mode of Transportation: Car Occupation: Unemployed Prior Function Level of Independence: Independent with basic ADLs, Independent with homemaking with ambulation, Independent with transfers  Able to Take Stairs?: Yes Driving: Yes Vocation: Unemployed Comments: Independent with all ADL/IADL tasks and mobility; takes care of her 3 grandchildren Vision Baseline Vision/History: No visual deficits Patient Visual Report: No change from baseline Vision Assessment?: No apparent visual deficits Perception  Perception: Within Functional Limits Praxis Praxis: Intact Cognition Overall Cognitive Status: Impaired/Different from baseline Arousal/Alertness: Awake/alert Orientation Level: Person;Situation Year: 2020 Month: May Day of Week: Correct Memory: Impaired Memory Impairment: Storage deficit;Retrieval deficit;Decreased recall of new information Immediate Memory Recall: Sock;Blue;Bed Memory Recall: Sock;Blue;Bed Memory Recall Sock: Without Cue Memory Recall Blue: Without Cue Memory Recall Bed: Without Cue Attention: Sustained Sustained Attention: Appears intact Awareness: Impaired Awareness Impairment: Emergent impairment Problem Solving: Impaired Problem Solving Impairment: Verbal complex;Functional complex Executive Function: Reasoning;Self Monitoring Reasoning:  Impaired Reasoning Impairment: Functional complex;Verbal complex Self Monitoring: Impaired Self Monitoring Impairment: Verbal complex;Functional complex Safety/Judgment: Impaired Sensation Sensation Light Touch: Appears Intact(reports L posterior leg numbness from back sx) Hot/Cold: Appears Intact Proprioception: Appears Intact Coordination Gross Motor Movements are Fluid and Coordinated: No Fine Motor Movements are Fluid and Coordinated: Yes Coordination and Movement Description: generalized deconditioning and balance deficits Finger Nose Finger Test: [facility] Motor  Motor Motor: Other (comment) Motor - Skilled Clinical Observations: generalized deconditioning Mobility  Bed Mobility Bed Mobility: Supine to Sit Supine to Sit: Supervision/Verbal cueing Transfers Sit to Stand: Contact Guard/Touching assist Stand to Sit: Contact Guard/Touching assist  Trunk/Postural Assessment  Cervical Assessment Cervical Assessment: Within Functional Limits Thoracic Assessment Thoracic Assessment: Exceptions to WFL(hx spinal sx, slightly kyphotic) Lumbar Assessment Lumbar Assessment: Within Functional Limits Postural Control Postural Control: Deficits on evaluation Righting Reactions: delayed  Balance Balance Balance Assessed: Yes Static Sitting Balance Static Sitting - Balance Support: Feet supported Static Sitting - Level of Assistance: 5: Stand by assistance Dynamic Sitting Balance Dynamic Sitting - Balance Support: Feet supported Dynamic Sitting - Level of Assistance: 5: Stand by assistance Dynamic Sitting - Balance Activities: Reaching across midline Static Standing Balance Static Standing - Balance Support: During functional activity;Bilateral upper extremity supported Static Standing - Level of Assistance: 5: Stand by assistance Dynamic Standing Balance Dynamic Standing - Balance Support: During functional activity;Bilateral upper extremity supported Dynamic Standing - Level of  Assistance: 4: Min assist Dynamic Standing - Balance Activities: Reaching across midline Extremity/Trunk Assessment RUE Assessment RUE Assessment: Exceptions to [facility] General Strength Comments: generalized weakness, 4-/5 LUE Assessment LUE Assessment: Exceptions to [facility] General Strength Comments: generalized weakness, 4-/5     Refer to Care Plan for Long Term Goals  Recommendations for other services: None    Discharge Criteria: Patient will be discharged from OT if patient refuses treatment 3 consecutive times without medical reason, if treatment goals not met, if there is a change in medical status, if patient makes no progress towards goals or if patient is discharged from hospital.  The above assessment, treatment plan, treatment alternatives and goals were discussed and mutually agreed upon: by patient  Curtis Sites 05/28/2018, 8:49 AM

## 2018-05-28 NOTE — Progress Notes (Signed)
[facility]    NEPHROLOGY PROGRESS NOTE  SUBJECTIVE: Patient seen earlier this morning.  In better spirits today.  Reports she is feeling better.  Denies any chest pain, shortness of breath, nausea, vomiting, diarrhea or dysuria.   All other review of systems are negative.    OBJECTIVE:  Vitals:   05/28/18 0847 05/28/18 1006  BP: (!) 149/73 (!) 149/82  Pulse:  85  Resp:    Temp:    SpO2:  97%    Intake/Output Summary (Last 24 hours) at 05/28/2018 1303 Last data filed at 05/28/2018 0800 Gross per 24 hour  Intake 600 ml  Output -  Net 600 ml      General:  AAOx3 NAD drowsy HEENT: MMM [place] AT anicteric sclera Neck:  No JVD, no adenopathy CV:  Heart RRR  Lungs:  L/S CTA bilaterally Abd:  abd SNT/ND with normal BS GU:  Bladder non-palpable Extremities: Trace bilateral lower extremity edema. Skin:  No skin rash  MEDICATIONS:  . ALPRAZolam  0.25 mg Oral TID  . amLODipine  5 mg Oral Daily  . Chlorhexidine Gluconate Cloth  6 each Topical Q0600  . escitalopram  10 mg Oral Daily  . feeding supplement (NEPRO CARB STEADY)  237 mL Oral TID BM  . heparin  5,000 Units Subcutaneous Q8H  . ipratropium-albuterol  3 mL Nebulization TID  . pantoprazole  40 mg Oral Daily  . [START ON 05/29/2018] predniSONE  40 mg Oral Q breakfast  . QUEtiapine  25 mg Oral QHS       LABS:   CBC Latest Ref Rng & Units 05/27/2018 05/25/2018 05/24/2018  WBC 4.0 - 10.5 K/uL 10.1 10.4 9.7  Hemoglobin 12.0 - 15.0 g/dL 10.6(L) 9.8(L) 9.7(L)  Hematocrit 36.0 - 46.0 % 33.6(L) 31.7(L) 30.7(L)  Platelets 150 - 400 K/uL 202 244 212    CMP Latest Ref Rng & Units 05/27/2018 05/26/2018 05/25/2018  Glucose 70 - 99 mg/dL 96 91 79  BUN 6 - 20 mg/dL 20 53(H) 64(H)  Creatinine 0.44 - 1.00 mg/dL 2.86(H) 5.64(H) 6.52(H)  Sodium 135 - 145 mmol/L 135 137 138  Potassium 3.5 - 5.1 mmol/L 3.7 4.0 4.1  Chloride 98 - 111 mmol/L 93(L) 97(L) 98  CO2 22 - 32 mmol/L 25 27 27   Calcium 8.9 - 10.3 mg/dL 8.8(L) 8.9 8.9   Total Protein 6.5 - 8.1 g/dL - - -  Total Bilirubin 0.3 - 1.2 mg/dL - - -  Alkaline Phos 38 - 126 U/L - - -  AST 15 - 41 U/L - - -  ALT 0 - 44 U/L - - -    Lab Results  Component Value Date   CALCIUM 8.8 (L) 05/27/2018   CAION 1.16 05/19/2018   PHOS 6.8 (H) 05/19/2018       Component Value Date/Time   COLORURINE YELLOW 05/21/2018 0901   APPEARANCEUR HAZY (A) 05/21/2018 0901   LABSPEC 1.009 05/21/2018 0901   PHURINE 6.0 05/21/2018 0901   GLUCOSEU NEGATIVE 05/21/2018 0901   HGBUR SMALL (A) 05/21/2018 0901   BILIRUBINUR NEGATIVE 05/21/2018 0901   KETONESUR NEGATIVE 05/21/2018 0901   PROTEINUR 100 (A) 05/21/2018 0901   NITRITE NEGATIVE 05/21/2018 0901   LEUKOCYTESUR NEGATIVE 05/21/2018 0901      Component Value Date/Time   PHART 7.340 (L) 05/21/2018 0305   PCO2ART 53.0 (H) 05/21/2018 0305   PO2ART 100 05/21/2018 0305   HCO3 27.9 05/21/2018 0305   TCO2 27 05/19/2018 0454   ACIDBASEDEF 1.1 05/20/2018 1240  O2SAT 96.4 05/21/2018 0305       Component Value Date/Time   FERRITIN 6,898 (H) 05/12/2018 2135       ASSESSMENT/PLAN:    48 year old female patient status post cardiac arrest of unknown duration, vent dependent respiratory failure, and anuric acute kidney injury, now dialysis dependent.  Baseline creatinine 0.7 from January 17, 2018 and 1.4 on arrival to the hospital on 05/12/2018.  1.  Acute kidney injury likely secondary to ischemic ATN.  Kidney ultrasound on 4/27 with no obstruction.  Sub-nephrotic proteinuria noted.  Has been on dialysis since 4/28. [place] placed 05/26/18.  Awaiting clip.  Will attempt HD again on Monday.    2.  Cardiac arrest.  Presumed due to a respiratory event.  3.  Acute on chronic respiratory failure with hypoxia.  Oxygenating well on nasal cannula.  4.  Aspiration pneumonia and COPD exacerbation.  Status post antibiotics.  5.  Anemia.  Hemoglobin is around 9.8-10.  Continue to monitor.  Iron stores are appropriate.  6.  Hyperphosphatemia.   Will continue binder..  7.  Hypertension.  Increase amlodipine to 10 mg.    [place], DO, [place]

## 2018-05-28 NOTE — Progress Notes (Signed)
+/-   sleep. Anxious at HS, visibly shaking. Scheduled xanax given. PRN tylenol given every 4 hours, requesting med before due. Abdominal bruising. Ostomy supplies brought by husband. Patient reports husband managed ostomy at home. Obvious cognitive deficits. No attempts OOB without assistance. O2 at 2l/m via [place]-keeping O2 sats in mid to upper 90's. Wore Bipap for 3 hours. Patrici Ranks A

## 2018-05-28 NOTE — Evaluation (Signed)
Physical Therapy Assessment and Plan  Patient Details  Name: Misty Green MRN: [id number] Date of Birth: 08-29-1970  PT Diagnosis: Abnormal posture, Abnormality of gait, Difficulty walking, Impaired cognition, Impaired sensation, Muscle weakness and Pain in chest Rehab Potential: Excellent ELOS: 5-7 days   Today's Date: 05/28/2018 PT Individual Time: 1000-1105 PT Individual Time Calculation (min): 65 min    Problem List:  Patient Active Problem List   Diagnosis Date Noted  . Hypoxic encephalopathy ([place]) 05/27/2018  . Anoxic brain injury ([place])   . ARF (acute renal failure) ([place])   . Lactic acidosis   . Transaminitis   . Acute on chronic respiratory failure with hypercapnia ([place])   . Cardiac arrest ([place]) 05/12/2018  . Diverticulitis of colon (without mention of hemorrhage)(562.11)   . Acute diverticulitis 01/06/2018  . Mixed anxiety and depressive disorder 12/22/2017  . Obesity 12/22/2017  . Tobacco user 12/22/2017  . Diverticulitis 12/03/2017  . Spinal stenosis of lumbar region 04/12/2016  . Acute respiratory failure ([place]) 03/22/2016  . COPD exacerbation ([place]) 03/22/2016  . Chronic back pain 03/22/2016  . Opioid type dependence, abuse ([place]) 03/22/2016  . Polycythemia 03/22/2016  . Anxiety 03/22/2016  . OSA on CPAP 03/22/2016  . Closed fracture of distal end of right radius 08/25/2015  . Closed nondisplaced fracture of styloid process of right ulna 08/25/2015    Past Medical History:  Past Medical History:  Diagnosis Date  . CHF (congestive heart failure) ([place])   . COPD (chronic obstructive pulmonary disease) ([place])   . Current smoker   . Opiate abuse, continuous ([place])    Past Surgical History:  Past Surgical History:  Procedure Laterality Date  . ABDOMINAL HYSTERECTOMY    . BACK SURGERY    . cesction    . COLOSTOMY  01/13/2018   Procedure: COLOSTOMY Creation;  Surgeon: Jules Husbands, MD;  Location: [facility] ORS;  Service: General;;  . INCISIONAL HERNIA REPAIR      lower midline laparotomy incision (hysterectomy), repaired with mesh  . IR FLUORO GUIDE CV LINE RIGHT  05/26/2018  . IR US GUIDE VASC ACCESS RIGHT  05/26/2018  . LAPAROSCOPIC CHOLECYSTECTOMY  2012  . LAPAROSCOPIC LYSIS OF ADHESIONS  01/13/2018   Procedure: LAPAROSCOPIC LYSIS OF ADHESIONS;  Surgeon: Jules Husbands, MD;  Location: [facility] ORS;  Service: General;;  . LAPAROSCOPIC SIGMOID COLECTOMY  01/13/2018   Procedure: LAPAROSCOPIC SIGMOID COLECTOMY;  Surgeon: Jules Husbands, MD;  Location: [facility] ORS;  Service: General;;  . ORIF WRIST FRACTURE Right 08/25/2015   Procedure: OPEN REDUCTION INTERNAL FIXATION (ORIF) WRIST FRACTURE;  Surgeon: Corky Mull, MD;  Location: [facility] ORS;  Service: Orthopedics;  Laterality: Right;    Assessment & Plan Clinical Impression: Patient is a 48 y.o. year old female with history of COPD, OSA- 3 L oxygen at nights, CHF, spinal stenosis who was admitted on 05/12/18 after bing found unresponsive and required 13-14 minutes of CPR by EMS. Per reports, patient had been feeling bad since 4/19 and had been sleeping for most of the day with husband checking in on her every 30-45 minutes. She was intubated in ED and started on epinephrine. She was started on IV antibiotics and steroids for acute hypoxic/hypercapneic failure with presumed aspiration event. 2 D echo showed EF 60-65% with normal systolic function. EEG done due to encephalopathy and showed discontinuous generalized background slowing. Neurology expressed concerns about recovery from hypoxic injury and CT/MRI head were negative for acute changes or anoxic injury. She developed oliguric renal failure  with rise in SCr due to AKI and was started on HD on 4/28. She was extubated to BIPAP on 4/30 and has had issues with agitation. She has had poor renal recovery and temporary HD catheter changed to tunneled IJ by Dr. Annamaria Boots on 5/8. Mentation improving but she continues to be limited by hypoxia, debility and cognitive deficits.  Repeat MRI brain 5/5 was negative for infarct or global anoxic injury. CIR recommended due to functional decline. Patient transferred to CIR on 05/27/2018 .   Patient currently requires min with mobility secondary to muscle weakness, decreased cardiorespiratoy endurance and decreased sitting balance, decreased standing balance, decreased postural control and decreased balance strategies.  Prior to hospitalization, patient was independent  with mobility and lived with Spouse in a Mobile home home.  Home access is 3 back; 3 in frontStairs to enter.  Patient will benefit from skilled PT intervention to maximize safe functional mobility, minimize fall risk and decrease caregiver burden for planned discharge home with intermittent assist.  Anticipate patient will benefit from follow up OP at discharge.  PT - End of Session Activity Tolerance: Tolerates 30+ min activity with multiple rests Endurance Deficit: Yes Endurance Deficit Description: generalized deconditioning PT Assessment Rehab Potential (ACUTE/IP ONLY): Excellent PT Barriers to Discharge: Home environment access/layout PT Barriers to Discharge Comments: 3-4 STE with wide B rails PT Patient demonstrates impairments in the following area(s): Balance;Safety;Sensory;Edema;Skin Integrity;Endurance;Motor;Pain PT Transfers Functional Problem(s): Bed Mobility;Bed to Chair;Car;Furniture;Floor PT Locomotion Functional Problem(s): Ambulation;Wheelchair Mobility;Stairs PT Plan PT Intensity: Minimum of 1-2 x/day ,45 to 90 minutes PT Frequency: 5 out of 7 days PT Duration Estimated Length of Stay: 5-7 days PT Treatment/Interventions: Ambulation/gait training;Community reintegration;DME/adaptive equipment instruction;Neuromuscular re-education;Psychosocial support;Stair training;UE/LE Strength taining/ROM;Balance/vestibular training;Discharge planning;Functional electrical stimulation;Pain management;Skin care/wound management;Therapeutic Activities;UE/LE  Coordination activities;Cognitive remediation/compensation;Disease management/prevention;Functional mobility training;Patient/family education;Splinting/orthotics;Therapeutic Exercise PT Transfers Anticipated Outcome(s): mod I PT Locomotion Anticipated Outcome(s): mod I for household distances, S for >150' with LRAD PT Recommendation Follow Up Recommendations: Home health PT;Outpatient PT Patient destination: Home Equipment Recommended: Rolling walker with 5" wheels Equipment Details: Patient states that she has a RW from previous back surgeries  Skilled Therapeutic Intervention In addition to the PT evaluation below, the patient performed the following skilled PT interventions:  Patient sitting up in bed upon PT arrival. Patient alert and agreeable to PT session. Patient was on 2L O2 throughout entire evaluation/session. O2 dropped to 90% following stairs and ambulation, recovered to >92% with pursed lip breathing in under 30 seconds.   Therapeutic Activity: Transfers: Patient performed sit to/from stand x5, a toilet transfer, and a car transferwith CGA using a RW, tend to push RW out of the way before sitting. Provided verbal cues for reaching back before sitting and keeping the RW close before going to sit down. Patient performed toileting with supervision for peri-care and CGA for LB dressing.  Gait Training:  Patient ambulated 15 feet x2 and 76 feet x1 using RW with CGA. See gait details below. Provided verbal cues for erect posture, looking ahead, and staying close to the RW, tends to stay far back from the walker. Patient ambulated 10 feet over unlevel surface, up/down a ramp, and up/down a curb with CGA using a RW. Cues provided for use of RW on unlevel surface and curb and safety awareness.  Wheelchair Mobility:  Patient propelled wheelchair 50 feet with supervision and set up assist for w/c parts, very safety aware with breaks, but sometime locking them right after unlocking them for  mobility. Provided verbal cues  for turning technique. PT provided a w/c cushion during session.   Patient in w/c at end of session with breaks locked, seat belt alarm set, and all needs within reach. Instructed pt in results of PT evaluation as detailed below, PT POC, rehab potential, rehab goals, and discharge recommendations. Additionally discussed CIR's policies regarding fall safety and use of chair alarm and/or quick release belt. Pt verbalized understanding and in agreement. Will update pt's family members as they become available.   PT Evaluation Precautions/Restrictions Precautions Precautions: Fall Precaution Comments: on 3L 02 at night at baseline, ostomy Restrictions Weight Bearing Restrictions: No General   Vital SignsTherapy Vitals Pulse Rate: 85 BP: (!) 149/82 Patient Position (if appropriate): Sitting Oxygen Therapy SpO2: 97 % O2 Flow Rate (L/min): 2 L/min Patient Activity (if Appropriate): In bed Pulse Oximetry Type: Intermittent Pain Pain Assessment Pain Scale: 0-10 Pain Score: 8  Pain Location: Chest Pain Orientation: Medial Pain Descriptors / Indicators: Aching Pain Onset: On-going Pain Intervention(s): Reposition;distraction; RN provided meds during session. Home Living/Prior Functioning Home Living Available Help at Discharge: Family Type of Home: Mobile home Home Access: Stairs to enter Entrance Stairs-Number of Steps: 3 back; 3 in front Entrance Stairs-Rails: Can reach both Home Layout: One level  Lives With: Spouse Prior Function Level of Independence: Independent with basic ADLs;Independent with homemaking with ambulation;Independent with transfers  Able to Take Stairs?: Yes Driving: Yes Vocation: Unemployed Comments: Independent with all ADL/IADL tasks and mobility; takes care of her 3 grandchildren Vision/Perception  Perception Perception: Within Functional Limits Praxis Praxis: Intact  Cognition Arousal/Alertness: Awake/alert Orientation  Level: Oriented X4 Attention: Sustained Sustained Attention: Appears intact Memory: Appears intact Awareness: Appears intact Problem Solving: Impaired Problem Solving Impairment: Verbal complex;Functional complex Safety/Judgment: Impaired Comments: decreased safety awareness with use of DME throughout session Sensation Sensation Light Touch: Impaired by gross assessment(decreased light touch L foot from prior back surgeries) Coordination Gross Motor Movements are Fluid and Coordinated: No Fine Motor Movements are Fluid and Coordinated: Yes Coordination and Movement Description: generalized deconditioning and balance deficits Finger Nose Finger Test: [facility] Motor  Motor Motor: Other (comment) Motor - Skilled Clinical Observations: generalized deconditioning and decreased balance  Mobility Bed Mobility Bed Mobility: Rolling Right;Rolling Left;Supine to Sit Rolling Right: Supervision/verbal cueing Rolling Left: Supervision/Verbal cueing Supine to Sit: Supervision/Verbal cueing Transfers Transfers: Sit to Stand;Stand to Sit;Stand Pivot Transfers Sit to Stand: Contact Guard/Touching assist Stand to Sit: Contact Guard/Touching assist Stand Pivot Transfers: Contact Guard/Touching assist Stand Pivot Transfer Details: Verbal cues for technique;Verbal cues for sequencing;Verbal cues for precautions/safety;Verbal cues for safe use of DME/AE Transfer (Assistive device): Rolling walker Locomotion  Gait Ambulation: Yes Gait Assistance: Contact Guard/Touching assist Gait Distance (Feet): 72 Feet Assistive device: Rolling walker Gait Gait: Yes Gait Pattern: Impaired Gait Pattern: Step-through pattern;Decreased hip/knee flexion - left;Decreased hip/knee flexion - right;Right foot flat;Left foot flat;Scissoring;Decreased trunk rotation;Trunk flexed;Narrow base of support Gait velocity: decreased  Stairs / Additional Locomotion Stairs: Yes Stairs Assistance: Contact Guard/Touching  assist Stair Management Technique: Two rails Number of Stairs: 4 Height of Stairs: 6 Ramp: Contact Guard/touching assist Curb: Nurse, mental health Mobility: Yes Wheelchair Assistance: Chartered loss adjuster: Both upper extremities Wheelchair Parts Management: Needs assistance Distance: 100'  Trunk/Postural Assessment  Cervical Assessment Cervical Assessment: Exceptions to WFL(forward head posture) Thoracic Assessment Thoracic Assessment: Exceptions to WFL(slightly kyphotic) Lumbar Assessment Lumbar Assessment: Exceptions to WFL(posterior pelvic tilt) Postural Control Postural Control: Deficits on evaluation Righting Reactions: delayed  Balance Balance Balance Assessed: Yes Static Sitting Balance  Static Sitting - Balance Support: Feet supported Static Sitting - Level of Assistance: 5: Stand by assistance Dynamic Sitting Balance Dynamic Sitting - Balance Support: Feet supported Dynamic Sitting - Level of Assistance: 5: Stand by assistance Dynamic Sitting - Balance Activities: Reaching across midline;Forward lean/weight shifting Static Standing Balance Static Standing - Balance Support: During functional activity;Bilateral upper extremity supported Static Standing - Level of Assistance: 5: Stand by assistance Dynamic Standing Balance Dynamic Standing - Balance Support: During functional activity;Right upper extremity supported;Left upper extremity supported Dynamic Standing - Level of Assistance: 4: Min assist Dynamic Standing - Balance Activities: Reaching across midline;Forward lean/weight shifting Extremity Assessment  RUE Assessment RUE Assessment: Exceptions to [facility] Active Range of Motion (AROM) Comments: WFL for all functional activity General Strength Comments: generalized weakness, but WFL for functional mobility LUE Assessment LUE Assessment: Exceptions to [facility] Active Range of Motion (AROM) Comments:  WFL for functional mobility General Strength Comments: generalized weakness, but WFL for functional mobiltiy RLE Assessment RLE Assessment: Exceptions to [facility] Active Range of Motion (AROM) Comments: WFL for functional mobility General Strength Comments: Grossly in sitting: 4+/5 throughout LLE Assessment LLE Assessment: Exceptions to [facility] Active Range of Motion (AROM) Comments: WFL for functional mobility General Strength Comments: Grossly in sitting: 4+/5 throughout    Refer to Care Plan for Long Term Goals  Recommendations for other services: None   Discharge Criteria: Patient will be discharged from PT if patient refuses treatment 3 consecutive times without medical reason, if treatment goals not met, if there is a change in medical status, if patient makes no progress towards goals or if patient is discharged from hospital.  The above assessment, treatment plan, treatment alternatives and goals were discussed and mutually agreed upon: by patient  Corrado Hymon L Dolce Sylvia PT, DPT 05/28/2018, 1:02 PM

## 2018-05-29 ENCOUNTER — Inpatient Hospital Stay (HOSPITAL_COMMUNITY): Payer: 59 | Admitting: Occupational Therapy

## 2018-05-29 ENCOUNTER — Inpatient Hospital Stay (HOSPITAL_COMMUNITY): Payer: 59

## 2018-05-29 ENCOUNTER — Inpatient Hospital Stay (HOSPITAL_COMMUNITY): Payer: 59 | Admitting: Physical Therapy

## 2018-05-29 LAB — RENAL FUNCTION PANEL
Albumin: 3.5 g/dL (ref 3.5–5.0)
Anion gap: 13 (ref 5–15)
BUN: 11 mg/dL (ref 6–20)
CO2: 28 mmol/L (ref 22–32)
Calcium: 8.4 mg/dL — ABNORMAL LOW (ref 8.9–10.3)
Chloride: 95 mmol/L — ABNORMAL LOW (ref 98–111)
Creatinine, Ser: 1.41 mg/dL — ABNORMAL HIGH (ref 0.44–1.00)
GFR calc Af Amer: 51 mL/min — ABNORMAL LOW (ref >60)
GFR calc non Af Amer: 44 mL/min — ABNORMAL LOW (ref >60)
Glucose, Bld: 131 mg/dL — ABNORMAL HIGH (ref 70–99)
Phosphorus: 1.4 mg/dL — ABNORMAL LOW (ref 2.5–4.6)
Potassium: 4.4 mmol/L (ref 3.5–5.1)
Sodium: 136 mmol/L (ref 135–145)

## 2018-05-29 LAB — CBC
HCT: 33.3 % — ABNORMAL LOW (ref 36.0–46.0)
Hemoglobin: 10.5 g/dL — ABNORMAL LOW (ref 12.0–15.0)
MCH: 29.9 pg (ref 26.0–34.0)
MCHC: 31.5 g/dL (ref 30.0–36.0)
MCV: 94.9 fL (ref 80.0–100.0)
Platelets: 241 10*3/uL (ref 150–400)
RBC: 3.51 MIL/uL — ABNORMAL LOW (ref 3.87–5.11)
RDW: 13.8 % (ref 11.5–15.5)
WBC: 8.6 10*3/uL (ref 4.0–10.5)
nRBC: 0 % (ref 0.0–0.2)

## 2018-05-29 MED ORDER — SODIUM CHLORIDE 0.9 % IV SOLN: 100.0000 mL | INTRAVENOUS | Status: DC | PRN

## 2018-05-29 MED ORDER — ALTEPLASE 2 MG IJ SOLR: 2.0000 mg | Freq: Once | INTRAMUSCULAR | Status: DC | PRN

## 2018-05-29 MED ORDER — PENTAFLUOROPROP-TETRAFLUOROETH EX AERO: 1.0000 "application " | INHALATION_SPRAY | CUTANEOUS | Status: DC | PRN

## 2018-05-29 MED ORDER — LIDOCAINE HCL (PF) 1 % IJ SOLN: 5.0000 mL | INTRAMUSCULAR | Status: DC | PRN

## 2018-05-29 MED ORDER — HEPARIN SODIUM (PORCINE) 1000 UNIT/ML DIALYSIS: 1000.0000 [IU] | INTRAMUSCULAR | Status: DC | PRN

## 2018-05-29 MED ORDER — HEPARIN SODIUM (PORCINE) 1000 UNIT/ML IJ SOLN
INTRAMUSCULAR | Status: AC
  Filled 2018-05-29: qty 4

## 2018-05-29 MED ORDER — HEPARIN SODIUM (PORCINE) 1000 UNIT/ML DIALYSIS: 100.0000 [IU]/kg | INTRAMUSCULAR | Status: DC | PRN

## 2018-05-29 MED ORDER — LIDOCAINE-PRILOCAINE 2.5-2.5 % EX CREA: 1.0000 "application " | TOPICAL_CREAM | CUTANEOUS | Status: DC | PRN

## 2018-05-29 MED ORDER — HEPARIN SODIUM (PORCINE) 1000 UNIT/ML IJ SOLN
3.8000 mL | Freq: Once | INTRAMUSCULAR | Status: AC
  Administered 2018-05-29: 3800 [IU] via INTRAVENOUS

## 2018-05-29 MED ORDER — HEPARIN SODIUM (PORCINE) 1000 UNIT/ML DIALYSIS: 20.0000 [IU]/kg | INTRAMUSCULAR | Status: DC | PRN

## 2018-05-29 MED ORDER — PREDNISONE 20 MG PO TABS
30.0000 mg | ORAL_TABLET | Freq: Every day | ORAL | Status: DC
  Administered 2018-05-30 – 2018-05-31 (×2): 30 mg via ORAL
  Filled 2018-05-29 (×2): qty 1

## 2018-05-29 MED FILL — predniSONE 10 MG TABS: 10 | 8 days supply | Qty: 20 | Fill #0

## 2018-05-29 NOTE — Progress Notes (Signed)
At [facility], Perseverating on getting ostomy bag changed. Minimal amount of stool in bag.  Patient reports husband managed changing ostomy at home, encouraged patient to learn. Supplies brought from home. Patient able to perform with min. - moderate assistance. 2 piece device, we changed wafer and pouch, no skin irritation observed. Mostly complains of right upper chest pain from "chest compressions". PRN tylenol given at 2016 & 0458. PRN ultram given at 0427. Melatonin given at 2144, did not require trazodone. complianed of "gas", PRN maalox given at [place]. Anxious at times-getting scheduled xanax. Refused Bipap, 02 at 2L/M via [place]. Congested NP cough.Misty Green A

## 2018-05-29 NOTE — Progress Notes (Addendum)
Misty Staggers, MD  Physician  Physical Medicine and Rehabilitation  PMR Pre-admission  Signed  Date of Service:  05/26/2018 1:45 PM       Related encounter: ED to Hosp-Admission (Discharged) from 05/12/2018 in [place]      Signed         Show:Clear all _0 Manual_1 Template_2 Copied  Added by: _3 Misty Green, PT  _4 Hover for details PMR Admission Coordinator Pre-Admission Assessment  Patient: Misty Green is an 48 y.o., female MRN: [id number] DOB: 09/26/1970 Height: _5  (154.9 cm) Weight: 86.5 kg                                                                                                                                                  Insurance Information HMO:     PPO: yes     PCP:      IPA:      80/20:      OTHER:  PRIMARY: [employer]      Policy#: [id number]      Subscriber: pt's spouse CM Name:       Phone#:      Fax#:  Pre-Cert#: [id number] auto-approved for 5/9 through 5/11 with updates due on 5/11 to Misty Green at [phone](fax)     Employer:  Benefits:  Phone #: [phone]     Name:  Eff. Date: 01/18/2018     Deduct: $600 (met)   Out of Pocket Max: $2500 [phone] met, includes deductible) Life Max: n/a CIR: 80%      SNF: 80% limited to 150 days Outpatient: $25 copay, limited to medical review for necessity after 40 visits Home Health: 80%      Co-Pay:  DME: 80%     Co-Pay:  Providers:  SECONDARY:       Policy#:       Subscriber:  CM Name:       Phone#:      Fax#:  Pre-Cert#:       Employer:  Benefits:  Phone #:      Name:  Eff. Date:      Deduct:       Out of Pocket Max:       Life Max:  CIR:       SNF:  Outpatient:      Co-Pay:  Home Health:       Co-Pay:  DME:      Co-Pay:   Medicaid Application Date:       Case Manager:  Disability Application Date:       Case Worker:   The "Data Collection Information Summary" for patients in Inpatient Rehabilitation Facilities with attached "[facility]" was provided and verbally reviewed with: N/A  Emergency Contact Information         Contact Information    Name Relation Home Work  Mobile   Misty Green Spouse [phone]  [phone]   Misty Green [phone]  [phone]   Misty Green Daughter   [phone]   Misty Green   [phone]     Current Medical History  Patient Admitting Diagnosis: encephalopathy 2/2 cardiac arrest  History of Present Illness: Misty Green a 48 y.o.femalewith history of COPD and uses CPAP, diastolic congestive heart failure, spinal stenosis, diverticulosis status post colostomy, opioid abuse.  Presented 05/12/2018 with nasal congestion and occasional shortness of breath. Patient with increasing headache and decrease in appetite complaints of hot flashes. She was found unresponsive family called 911. First responders did CPR for approximately 15 minutes. Patient did require intubation/ventilatory support. COVID negative. Placed on vasopressors. CT/MRI the brain negative for acute changes. EEG negative for seizure. Echocardiogram with ejection fraction of 62% normal systolic function. Renal services consulted 05/15/2018 for elevated creatinine baseline 1.4-4.25. AKI likely ischemic ATN related to cardiac arrest.She has been receiving dialysis (TTS schedule). Patient did complete a course of antibiotics for aspiration pneumonia. Noted delirium agitation suspect acute encephalopathy due to cardiac arrest. Dysphasia #2 nectar thick liquids. Subcutaneous heparin for DVT prophylaxis. Therapy evaluations completed recommendations of physical medicine rehab consult  Past Medical History      Past Medical History:  Diagnosis Date  . CHF (congestive heart failure) ([place])   . COPD (chronic obstructive pulmonary disease) ([place])   . Current smoker   . Opiate abuse, continuous ([facility])     Family History  family  history includes COPD in her mother; Diabetes in her father; Hypertension in her father.  Prior Rehab/Hospitalizations:  Has the patient had prior rehab or hospitalizations prior to admission? Yes, admitted for perforated diverticulitis in December with colostomy  Has the patient had major surgery during 100 days prior to admission? Yes, colostomy in Dec 2019  Current Medications   Current Facility-Administered Medications:  .  acetaminophen (TYLENOL) tablet 650 mg, 650 mg, Oral, Q6H PRN, Edwin Dada, MD, 650 mg at 05/26/18 1333 .  albuterol (PROVENTIL) (2.5 MG/3ML) 0.083% nebulizer solution 2.5 mg, 2.5 mg, Nebulization, Q3H PRN, Wilhelmina Mcardle, MD .  ALPRAZolam Duanne Moron) tablet 0.25 mg, 0.25 mg, Oral, TID, Danford, Suann Larry, MD, 0.25 mg at 05/26/18 0921 .  amLODipine (NORVASC) tablet 5 mg, 5 mg, Oral, Daily, Danford, Suann Larry, MD, 5 mg at 05/26/18 0920 .  ceFAZolin (ANCEF) IVPB 2g/100 mL premix, 2 g, Intravenous, to Misty Jolly, PA-C .  chlorhexidine gluconate (MEDLINE KIT) (PERIDEX) 0.12 % solution 15 mL, 15 mL, Mouth Rinse, BID, Scatliffe, Kristen D, MD, 15 mL at 05/24/18 2058 .  Chlorhexidine Gluconate Cloth 2 % PADS 6 each, 6 each, Topical, Daily, Scatliffe, Rise Paganini, MD, 6 each at 05/23/18 0000 .  Chlorhexidine Gluconate Cloth 2 % PADS 6 each, 6 each, Topical, Q0600, [name], MD, 6 each at 05/25/18 [phone] .  Chlorhexidine Gluconate Cloth 2 % PADS 6 each, 6 each, Topical, Q0600, Rosita Fire, MD .  escitalopram (LEXAPRO) tablet 10 mg, 10 mg, Oral, Daily, Allie Bossier, MD, 10 mg at 05/26/18 2947 .  feeding supplement (NEPRO CARB STEADY) liquid 237 mL, 237 mL, Oral, TID BM, Florencia Reasons, MD, Stopped at 05/26/18 [phone] .  [START ON 05/27/2018] heparin injection 5,000 Units, 5,000 Units, Subcutaneous, Q8H, Turpin, Pamela, PA-C .  insulin aspart (novoLOG) injection 0-15 Units, 0-15 Units, Subcutaneous, Q4H, Wilhelmina Mcardle, MD, 3 Units at 05/25/18  2100 .  ipratropium-albuterol (DUONEB) 0.5-2.5 (3) MG/3ML nebulizer solution 3 mL,  3 mL, Nebulization, TID, Danford, Suann Larry, MD, 3 mL at 05/26/18 0836 .  MEDLINE mouth rinse, 15 mL, Mouth Rinse, BID, Sampson Goon, MD, 15 mL at 05/26/18 0931 .  methylPREDNISolone sodium succinate (SOLU-MEDROL) 40 mg/mL injection 40 mg, 40 mg, Intravenous, Daily, Icard, Bradley L, DO, 40 mg at 05/26/18 0930 .  pantoprazole (PROTONIX) EC tablet 40 mg, 40 mg, Oral, Daily, Danford, Suann Larry, MD, 40 mg at 05/26/18 0921 .  [employer], , Oral, PRN, Audria Nine, DO .  sodium chloride flush (NS) 0.9 % injection 10-40 mL, 10-40 mL, Intracatheter, PRN, Scatliffe, Kristen D, MD, 10 mL at 05/23/18 2235 .  sodium chloride flush (NS) 0.9 % injection 10-40 mL, 10-40 mL, Intracatheter, PRN, Florencia Reasons, MD .  sodium polystyrene (KAYEXALATE) 15 GM/60ML suspension 30 g, 30 g, Rectal, Once, Frederik Pear, MD, Stopped at 05/19/18 0239  Patients Current Diet:     Diet Order                  Diet NPO time specified Except for: Sips with Meds  Diet effective midnight               Precautions / Restrictions Precautions Precautions: Fall Precaution Comments: on 3L 02 at baseline Restrictions Weight Bearing Restrictions: No   Has the patient had 2 or more falls or a fall with injury in the past year?No  Prior Activity Level Community (5-7x/wk): very active, takes care of her 3 grandchildren  Prior Functional Level Prior Function Level of Independence: Independent Comments: Independent with all ADL/IADL tasks and mobility; takes care of her 3 grandchildren per admit in 2019  Self Care: Did the patient need help bathing, dressing, using the toilet or eating?  Independent  Indoor Mobility: Did the patient need assistance with walking from room to room (with or without device)? Independent  Stairs: Did the patient need assistance with internal or external stairs (with or  without device)? Independent  Functional Cognition: Did the patient need help planning regular tasks such as shopping or remembering to take medications? Independent  Home Assistive Devices / Equipment Home Equipment: None  Prior Device Use: Indicate devices/aids used by the patient prior to current illness, exacerbation or injury? None of the above  Current Functional Level Cognition  Arousal/Alertness: Awake/alert Overall Cognitive Status: Impaired/Different from baseline Current Attention Level: Sustained Orientation Level: Oriented to person, Oriented to place, Oriented to situation, Disoriented to time Following Commands: Follows one step commands with increased time Safety/Judgement: Decreased awareness of safety, Decreased awareness of deficits General Comments: pt with difficulty recalling during conversation Attention: Sustained Sustained Attention: Impaired Sustained Attention Impairment: Functional basic, Verbal basic Memory: Impaired Memory Impairment: Storage deficit, Retrieval deficit, Decreased recall of new information Awareness: Impaired Awareness Impairment: Intellectual impairment, Emergent impairment, Anticipatory impairment Safety/Judgment: Impaired    Extremity Assessment (includes Sensation/Coordination)  Upper Extremity Assessment: Generalized weakness  Lower Extremity Assessment: Generalized weakness RLE Coordination: decreased gross motor LLE Coordination: decreased gross motor    ADLs  Overall ADL's : Needs assistance/impaired Eating/Feeding: Moderate assistance, Bed level(in chair position) Grooming: Oral care, Sitting, Minimal assistance Grooming Details (indicate cue type and reason): matting noted in hair. Shampoo cap applied and attempted to de-tangle with comb. no success today. RN aware. Pt will need detangler and significant amt of time to work on matting- potentially might need it cut out Upper Body Bathing: Minimal assistance Lower  Body Bathing: Maximal assistance Upper Body Dressing : Moderate assistance, Sitting Upper Body  Dressing Details (indicate cue type and reason): to doff front opening gown Lower Body Dressing: Maximal assistance Lower Body Dressing Details (indicate cue type and reason): to don socks Toilet Transfer: Minimal assistance, +2 for physical assistance, +2 for safety/equipment, Ambulation(EVA walker) Toilet Transfer Details (indicate cue type and reason): face to face transfer Toileting- Clothing Manipulation and Hygiene: Maximal assistance, Sit to/from stand Toileting - Clothing Manipulation Details (indicate cue type and reason): Pt with colostomy bag at baseline, no balance for perianal care Functional mobility during ADLs: Min guard, Rolling walker General ADL Comments: confusion, agitation about not being able to leave.    Mobility  Overal bed mobility: Needs Assistance Bed Mobility: Sit to Supine Supine to sit: Min assist Sit to supine: Supervision General bed mobility comments: no physical assist needed    Transfers  Overall transfer level: Needs assistance Equipment used: Rolling walker (2 wheeled) Transfers: Sit to/from Stand, Stand Pivot Transfers Sit to Stand: Min assist Stand pivot transfers: Min assist General transfer comment: sit to stand with assist for stability.    Ambulation / Gait / Stairs / Wheelchair Mobility  Ambulation/Gait Ambulation/Gait assistance: Min assist, Mod assist, +2 physical assistance Gait Distance (Feet): 155 Feet Assistive device: Bilateral platform walker(EVA walker ) Gait Pattern/deviations: Scissoring, Staggering left, Staggering right, Narrow base of support, Trunk flexed, Leaning posteriorly, Step-through pattern, Decreased stride length General Gait Details: Pt was able to walk with [name] walker with cues and assist for stability as pt was scissoring at times. Cues for safety.  unsteady at times.  Remained on 4 lts to achieve sats > 90% Gait  velocity: decreased  Gait velocity interpretation: <1.31 ft/sec, indicative of household ambulator    Posture / Balance Balance Overall balance assessment: Needs assistance Sitting-balance support: Feet supported, Single extremity supported Sitting balance-Leahy Scale: Fair Standing balance support: Single extremity supported Standing balance-Leahy Scale: Poor Standing balance comment: in static standing    Special needs/care consideration BiPAP/CPAP CPAP at nigh CPM no Continuous Drip IV no Dialysis yes        Days Tues/Thurs/Sat Life Vest no Oxygen yes, 2L via nasal cannula Special Bed no Trach Size no Wound Vac (area) no      Location n/a Skin ecchymosis to BUEs                            Bowel mgmt: continent/ostomy 05/26/2018 Bladder mgmt: continent Diabetic mgmt no Behavioral consideration no Chemo/radiation no     Previous Home Environment (from acute therapy documentation) Living Arrangements: Spouse/significant other  Lives With: Spouse Available Help at Discharge: Family Type of Home: House Home Layout: One level Home Access: Stairs to enter Entrance Stairs-Rails: Can reach both Entrance Stairs-Number of Steps: 5 back; 4 in front Bathroom Shower/Tub: Chiropodist: Handicapped height Home Care Services: No  Discharge Living Setting Plans for Discharge Living Setting: Patient's home Type of Home at Discharge: House Discharge Home Layout: One level Discharge Home Access: Stairs to enter Entrance Stairs-Rails: Can reach both Entrance Stairs-Number of Steps: 5 in the back, 4 in the front Discharge Bathroom Shower/Tub: Tub/shower unit Discharge Bathroom Toilet: Handicapped height Discharge Bathroom Accessibility: Yes How Accessible: Accessible via walker Does the patient have any problems obtaining your medications?: No  Social/Family/Support Systems Patient Roles: Caregiver, Spouse, Parent Anticipated Caregiver: Husband Data processing manager) and  Daughter Aniceto Boss) Anticipated Caregiver's Contact Information: [name] [phone] Ability/Limitations of Caregiver: [name] can provide supervision to min assist. First plan to go  home with [name] if pt truly reaches supervision/min assist.  If she needs more help, Aniceto Boss and [name] are open to pt coming to [facility] home.  Caregiver Availability: 24/7 Discharge Plan Discussed with Primary Caregiver: Yes(discussed with [name] and Tasha) Is Caregiver In Agreement with Plan?: Yes Does Caregiver/Family have Issues with Lodging/Transportation while Pt is in Rehab?: No   Goals/Additional Needs Patient/Family Goal for Rehab: PT/OT/SLP supervision/min assist Expected length of stay: 20-30 days Dietary Needs: thin Equipment Needs: tbd Pt/Family Agrees to Admission and willing to participate: Yes Program Orientation Provided & Reviewed with Pt/Caregiver Including Roles  & Responsibilities: Yes     Possible need for SNF placement upon discharge: not anticipated   Patient Condition: This patient's medical and functional status has changed since the consult dated: 05/22/2018 in which the Rehabilitation Physician determined and documented that the patient's condition is appropriate for intensive rehabilitative care in an inpatient rehabilitation facility. See "History of Present Illness" (above) for medical update. Functional changes are: pt currently min to mod assist +2 for mobility. Patient's medical and functional status update has been discussed with the Rehabilitation physician and patient remains appropriate for inpatient rehabilitation. Will admit to inpatient rehab today.  Preadmission Screen Completed By:  Misty Green, PT, DPT 05/26/2018 1:45 PM ______________________________________________________________________   Discussed status with Dr. Naaman Plummer on 05/26/18 at 1:52 PM  and received approval for admission today.  Admission Coordinator:  Misty Green, DPT time 1:53 PM  Sudie Grumbling 05/26/18          Revision History

## 2018-05-29 NOTE — Progress Notes (Signed)
[facility]    NEPHROLOGY PROGRESS NOTE  SUBJECTIVE: Patient in good spirits today.  Reports she is feeling better.  Denies any chest pain, shortness of breath, nausea, vomiting, diarrhea or dysuria.   All other review of systems are negative.   OBJECTIVE:  Vitals:   05/29/18 0400 05/29/18 0724  BP: (!) 148/75   Pulse: 89   Resp:    Temp: 98.5 F (36.9 C)   SpO2: 98% 97%    Intake/Output Summary (Last 24 hours) at 05/29/2018 1225 Last data filed at 05/29/2018 0700 Gross per 24 hour  Intake 720 ml  Output -  Net 720 ml      General:  AAOx3 NAD drowsy HEENT: MMM [place] AT anicteric sclera Neck:  No JVD, no adenopathy CV:  Heart RRR  Lungs:  L/S CTA bilaterally Abd:  abd SNT/ND with normal BS GU:  Bladder non-palpable Extremities: Trace bilateral lower extremity edema. Skin:  No skin rash  MEDICATIONS:  . ALPRAZolam  0.25 mg Oral TID  . amLODipine  10 mg Oral Daily  . Chlorhexidine Gluconate Cloth  6 each Topical Q0600  . escitalopram  10 mg Oral Daily  . feeding supplement (NEPRO CARB STEADY)  237 mL Oral TID BM  . heparin  5,000 Units Subcutaneous Q8H  . ipratropium-albuterol  3 mL Nebulization TID  . pantoprazole  40 mg Oral Daily  . [START ON 05/30/2018] predniSONE  30 mg Oral Q breakfast  . QUEtiapine  25 mg Oral QHS       LABS:   CBC Latest Ref Rng & Units 05/27/2018 05/25/2018 05/24/2018  WBC 4.0 - 10.5 K/uL 10.1 10.4 9.7  Hemoglobin 12.0 - 15.0 g/dL 10.6(L) 9.8(L) 9.7(L)  Hematocrit 36.0 - 46.0 % 33.6(L) 31.7(L) 30.7(L)  Platelets 150 - 400 K/uL 202 244 212    CMP Latest Ref Rng & Units 05/27/2018 05/26/2018 05/25/2018  Glucose 70 - 99 mg/dL 96 91 79  BUN 6 - 20 mg/dL 20 53(H) 64(H)  Creatinine 0.44 - 1.00 mg/dL 2.86(H) 5.64(H) 6.52(H)  Sodium 135 - 145 mmol/L 135 137 138  Potassium 3.5 - 5.1 mmol/L 3.7 4.0 4.1  Chloride 98 - 111 mmol/L 93(L) 97(L) 98  CO2 22 - 32 mmol/L 25 27 27   Calcium 8.9 - 10.3 mg/dL 8.8(L) 8.9 8.9  Total Protein 6.5 - 8.1  g/dL - - -  Total Bilirubin 0.3 - 1.2 mg/dL - - -  Alkaline Phos 38 - 126 U/L - - -  AST 15 - 41 U/L - - -  ALT 0 - 44 U/L - - -    Lab Results  Component Value Date   CALCIUM 8.8 (L) 05/27/2018   CAION 1.16 05/19/2018   PHOS 6.8 (H) 05/19/2018       Component Value Date/Time   COLORURINE YELLOW 05/21/2018 0901   APPEARANCEUR HAZY (A) 05/21/2018 0901   LABSPEC 1.009 05/21/2018 0901   PHURINE 6.0 05/21/2018 0901   GLUCOSEU NEGATIVE 05/21/2018 0901   HGBUR SMALL (A) 05/21/2018 0901   BILIRUBINUR NEGATIVE 05/21/2018 0901   KETONESUR NEGATIVE 05/21/2018 0901   PROTEINUR 100 (A) 05/21/2018 0901   NITRITE NEGATIVE 05/21/2018 0901   LEUKOCYTESUR NEGATIVE 05/21/2018 0901      Component Value Date/Time   PHART 7.340 (L) 05/21/2018 0305   PCO2ART 53.0 (H) 05/21/2018 0305   PO2ART 100 05/21/2018 0305   HCO3 27.9 05/21/2018 0305   TCO2 27 05/19/2018 0454   ACIDBASEDEF 1.1 05/20/2018 1240   O2SAT 96.4 05/21/2018  3419       Component Value Date/Time   FERRITIN 6,898 (H) 05/12/2018 2135       ASSESSMENT/PLAN:    48 year old female patient status post cardiac arrest of unknown duration, vent dependent respiratory failure, and anuric acute kidney injury, now dialysis dependent.  Baseline creatinine 0.7 from January 17, 2018 and 1.4 on arrival to the hospital on 05/12/2018.  1.  Acute kidney injury likely secondary to ischemic ATN.  Kidney ultrasound on 4/27 with no obstruction.  Sub-nephrotic proteinuria noted.  Has been on dialysis since 4/28. [place] placed 05/26/18.  We will plan for Monday, Wednesday, Friday dialysis this week due to catheter issues last week.  Ultimately, has an outpatient seat in [place] on Tuesday, Thursday, and Saturday.  Will transition to this next week.  2.  Cardiac arrest.  Presumed due to a respiratory event.  3.  Acute on chronic respiratory failure with hypoxia.  Oxygenating well on nasal cannula.  4.  Aspiration pneumonia and COPD exacerbation.  Status  post antibiotics.  5.  Anemia.  Hemoglobin is around 9.8-10.  Continue to monitor.  Iron stores are appropriate.  6.  Hyperphosphatemia.  Will continue binder..  7.  Hypertension.  Continue amlodipine.   [place], DO, [place]

## 2018-05-29 NOTE — Progress Notes (Addendum)
Physical Therapy Session Note  Patient Details  Name: Misty Green MRN: [id number] Date of Birth: 02-02-1970  Today's Date: 05/29/2018 PT Individual Time: 0349-6116 PT Individual Time Calculation (min): 15 min   Short Term Goals: Week 1:  PT Short Term Goal 1 (Week 1): STG=LTG due to short ELOS.  Skilled Therapeutic Interventions/Progress Updates:  Pt received in room with NT assisting her to [facility]. Pt with continent void on [facility] & ambulates to sink then bed without AD & supervision. Pt performs hand hygiene standing at sink with supervision. Pt on 2L/min supplemental oxygen during session & SpO2 >90% but pt does endorse SOB when asked. Pt then gets phone call & wishes to take it. Therapist returns a few minutes later & pt agreeable to tx but then receives lunch tray & requests to eat. Pt left sitting up in bed with meal tray & all needs at hand. Pt missed 45 minutes of scheduled PT treatment, will f/u per POC.   Therapy Documentation Precautions:  Precautions Precautions: Fall Precaution Comments: on 3L 02 at night at baseline, ostomy Restrictions Weight Bearing Restrictions: No   General: PT Amount of Missed Time (min): 45 Minutes PT Missed Treatment Reason: (phone call then pt wishing to eat lunch)    Pain: Pt reports 10/10 chest soreness 2/2 compressions & meds requested from RN.  Pt also c/o nausea & meds requested.    Therapy/Group: Individual Therapy  Misty Green 05/29/2018, 12:12 PM

## 2018-05-29 NOTE — Plan of Care (Signed)
  Problem: RH BOWEL ELIMINATION Goal: RH STG MANAGE BOWEL WITH ASSISTANCE Description STG Manage Bowel with Assistance. Mod I  Outcome: Progressing   Problem: RH BLADDER ELIMINATION Goal: RH STG MANAGE BLADDER WITH ASSISTANCE Description STG Manage Bladder With Assistance. Mod I  Outcome: Progressing   Problem: RH SKIN INTEGRITY Goal: RH STG ABLE TO PERFORM INCISION/WOUND CARE W/ASSISTANCE Description STG Able To Perform Incision/Wound Care With Assistance. Mod I  Outcome: Progressing   Problem: RH SAFETY Goal: RH STG ADHERE TO SAFETY PRECAUTIONS W/ASSISTANCE/DEVICE Description STG Adhere to Safety Precautions With Assistance/Device. Mod I  Outcome: Progressing   Problem: RH KNOWLEDGE DEFICIT GENERAL Goal: RH STG INCREASE KNOWLEDGE OF SELF CARE AFTER HOSPITALIZATION Description Patient will be able to verbalize self care at home with cues/handouts  Outcome: Progressing

## 2018-05-29 NOTE — IPOC Note (Signed)
Overall Plan of Care [facility]) Patient Details Name: YAJAYRA FELDT MRN: [id number] DOB: [birth year]  Admitting Diagnosis: <principal problem not specified>  Hospital Problems: Active Problems:   Hypoxic encephalopathy ([place])     Functional Problem List: Nursing Behavior, Bowel, Endurance, Medication Management, Nutrition, Perception, Safety, Skin Integrity  PT Balance, Safety, Sensory, Edema, Skin Integrity, Endurance, Motor, Pain  OT Balance, Motor, Cognition, Pain, Endurance, Safety  SLP    TR         Basic ADL's: OT Bathing, Dressing, Toileting     Advanced  ADL's: OT Simple Meal Preparation     Transfers: PT Bed Mobility, Bed to Chair, Car, Furniture, Floor  OT Toilet, Tub/Shower     Locomotion: PT Ambulation, Emergency planning/management officer, Stairs     Additional Impairments: OT None  SLP        TR      Anticipated Outcomes Item Anticipated Outcome  Self Feeding no goal set  Swallowing      Basic self-care  (S)  Toileting  (S)   Bathroom Transfers (S)  Bowel/Bladder  remain continet of bladder. Maintain independent management of ostomy pouch  Transfers  mod I  Locomotion  mod I for household distances, S for >150' with LRAD  Communication     Cognition     Pain  less than 3  Safety/Judgment  Remain free of falls, skin breakdown and infection   Therapy Plan: PT Intensity: Minimum of 1-2 x/day ,45 to 90 minutes PT Frequency: 5 out of 7 days PT Duration Estimated Length of Stay: 5-7 days OT Intensity: Minimum of 1-2 x/day, 45 to 90 minutes OT Frequency: 5 out of 7 days OT Duration/Estimated Length of Stay: 5-7 days     Due to the current state of emergency, patients may not be receiving their 3-hours of Medicare-mandated therapy.   Team Interventions: Nursing Interventions Patient/Family Education, Bowel Management, Skin Care/Wound Management, Dysphagia/Aspiration Precaution Training, Bladder Management, Medication Management, Cognitive  Remediation/Compensation  PT interventions Ambulation/gait training, Community reintegration, DME/adaptive equipment instruction, Neuromuscular re-education, Psychosocial support, Stair training, UE/LE Strength taining/ROM, Training and development officer, Discharge planning, Functional electrical stimulation, Pain management, Skin care/wound management, Therapeutic Activities, UE/LE Coordination activities, Cognitive remediation/compensation, Disease management/prevention, Functional mobility training, Patient/family education, Splinting/orthotics, Therapeutic Exercise  OT Interventions Training and development officer, Community reintegration, Disease mangement/prevention, Barrister's clerk education, Self Care/advanced ADL retraining, Therapeutic Exercise, UE/LE Coordination activities, Cognitive remediation/compensation, Discharge planning, DME/adaptive equipment instruction, Functional mobility training, Psychosocial support, Pain management, Skin care/wound managment, Therapeutic Activities, UE/LE Strength taining/ROM  SLP Interventions    TR Interventions    SW/CM Interventions Discharge Planning, Psychosocial Support, Patient/Family Education   Barriers to Discharge MD  Medical stability  Nursing Behavior reduce use of xanax  PT Home environment access/layout 3-4 STE with wide B rails  OT      SLP      SW       Team Discharge Planning: Destination: PT-Home ,OT- Home , SLP-  Projected Follow-up: PT-Home health PT, Outpatient PT, OT-  Home health OT, SLP-  Projected Equipment Needs: PT-Rolling walker with 5" wheels, OT- Tub/shower bench, SLP-  Equipment Details: PT-Patient states that she has a RW from previous back surgeries, OT-  Patient/family involved in discharge planning: PT- Patient,  OT-Patient, SLP-   MD ELOS: 5-7 days Medical Rehab Prognosis:  Excellent Assessment: The patient has been admitted for CIR therapies with the diagnosis of anoxic encephalopathy. The team will be addressing  functional mobility, strength, stamina, balance, safety, adaptive techniques and equipment, self-care, bowel and  bladder mgt, patient and caregiver education, NMR, cognitive perceptual rx, behavior, sleep/wake. Goals have been set at supervision to mod I for self-care and mod I for mobility. .   Due to the current state of emergency, patients may not be receiving their 3 hours per day of Medicare-mandated therapy.    Meredith Staggers, MD, FAAPMR      See Team Conference Notes for weekly updates to the plan of care

## 2018-05-29 NOTE — Progress Notes (Signed)
Meredith Staggers, MD  Physician  Physical Medicine and Rehabilitation  Consult Note  Signed  Date of Service:  05/22/2018 12:11 PM       Related encounter: ED to Hosp-Admission (Discharged) from 05/12/2018 in [facility]      Signed      Expand All Collapse All    Show:Clear all [x] Manual[x] Template[] Copied  Added by: [x] Angiulli, Lavon Paganini, PA-C[x] Meredith Staggers, MD  [] Hover for details      Physical Medicine and Rehabilitation Consult Reason for Consult: Decreased functional mobility Referring Physician: [facility]   HPI: Misty Green is a 48 y.o. female with history of COPD and uses CPAP, diastolic congestive heart failure, spinal stenosis, diverticulosis status post colostomy, opioid abuse.  Per chart review patient lives with spouse.  One level home with 4 steps to entry.  Independent with all ADLs tasks and mobility takes care of 3 grandchildren.  Presented 05/12/2018 with nasal congestion and occasional shortness of breath.  Patient with increasing headache and decrease in appetite complaints of hot flashes.  She was found unresponsive family called 911.  First responders did CPR for approximately 15 minutes.  Patient did require intubation/ventilatory support.  COVID negative.  Placed on vasopressors.  CT/MRI the brain negative for acute changes.  EEG negative for seizure.  Echocardiogram with ejection fraction of 96% normal systolic function.  Renal services consulted 05/15/2018 for elevated creatinine baseline 1.4-4.25.  AKI likely ischemic ATN related to cardiac arrest.  She has been receiving dialysis.  Patient did complete a course of antibiotics for aspiration pneumonia.  Noted delirium agitation suspect acute encephalopathy due to cardiac arrest.  Dysphasia #2 nectar thick liquids.  Subcutaneous heparin for DVT prophylaxis.  Therapy evaluations completed recommendations of physical medicine rehab consult.   Review of Systems  Unable to  perform ROS: Acuity of condition       Past Medical History:  Diagnosis Date  . CHF (congestive heart failure) ([place])   . COPD (chronic obstructive pulmonary disease) ([place])   . Current smoker   . Opiate abuse, continuous ([place])         Past Surgical History:  Procedure Laterality Date  . ABDOMINAL HYSTERECTOMY    . BACK SURGERY    . cesction    . COLOSTOMY  01/13/2018   Procedure: COLOSTOMY Creation;  Surgeon: Jules Husbands, MD;  Location: [facility] ORS;  Service: General;;  . INCISIONAL HERNIA REPAIR     lower midline laparotomy incision (hysterectomy), repaired with mesh  . LAPAROSCOPIC CHOLECYSTECTOMY  2012  . LAPAROSCOPIC LYSIS OF ADHESIONS  01/13/2018   Procedure: LAPAROSCOPIC LYSIS OF ADHESIONS;  Surgeon: Jules Husbands, MD;  Location: [facility] ORS;  Service: General;;  . LAPAROSCOPIC SIGMOID COLECTOMY  01/13/2018   Procedure: LAPAROSCOPIC SIGMOID COLECTOMY;  Surgeon: Jules Husbands, MD;  Location: [facility] ORS;  Service: General;;  . ORIF WRIST FRACTURE Right 08/25/2015   Procedure: OPEN REDUCTION INTERNAL FIXATION (ORIF) WRIST FRACTURE;  Surgeon: Corky Mull, MD;  Location: [facility] ORS;  Service: Orthopedics;  Laterality: Right;        Family History  Problem Relation Age of Onset  . COPD Mother   . Hypertension Father   . Diabetes Father    Social History:  reports that she has quit smoking. Her smoking use included cigarettes. She has a 15.00 pack-year smoking history. She has never used smokeless tobacco. She reports current drug use. Drug: Marijuana. She reports that she does not drink alcohol. Allergies:  Allergies  Allergen Reactions  . Other Nausea And Vomiting    Anti-depressent that starts with t         Medications Prior to Admission  Medication Sig Dispense Refill  . acetaminophen (TYLENOL) 500 MG tablet Take 500 mg by mouth every 6 (six) hours as needed for mild pain or fever.    Marland Kitchen albuterol (PROVENTIL HFA;VENTOLIN HFA) 108 (90  Base) MCG/ACT inhaler Inhale into the lungs every 6 (six) hours as needed for wheezing or shortness of breath.    . ALPRAZolam (XANAX) 1 MG tablet Take 1 mg by mouth 4 (four) times daily.    . Ascorbic Acid (VITAMIN C) 1000 MG tablet Take 1,000 mg by mouth daily.    . diphenhydrAMINE (BENADRYL) 50 MG capsule Take 50 mg by mouth at bedtime as needed for sleep.    Marland Kitchen escitalopram (LEXAPRO) 10 MG tablet Take 10 mg by mouth daily.    . Fluticasone-Salmeterol (ADVAIR) 250-50 MCG/DOSE AEPB Inhale 1 puff into the lungs 2 (two) times daily.    . furosemide (LASIX) 40 MG tablet Take 40 mg by mouth daily as needed for fluid.     . Multiple Vitamins-Minerals (MULTIVITAMIN WITH MINERALS) tablet Take 1 tablet by mouth daily.    . pantoprazole (PROTONIX) 40 MG tablet Take 40 mg by mouth daily.    . roflumilast (DALIRESP) 500 MCG TABS tablet Take 500 mcg by mouth daily.    Marland Kitchen tiotropium (SPIRIVA) 18 MCG inhalation capsule Place 18 mcg into inhaler and inhale daily.    . cyclobenzaprine (FLEXERIL) 5 MG tablet Take 1 tablet (5 mg total) by mouth 3 (three) times daily. (Patient not taking: Reported on 05/13/2018) 30 tablet 0  . docusate sodium (COLACE) 100 MG capsule Take 100 mg by mouth 2 (two) times daily.    Marland Kitchen gabapentin (NEURONTIN) 300 MG capsule Take 800 mg by mouth 3 (three) times daily.     . metaxalone (SKELAXIN) 400 MG tablet Take 1 tablet (400 mg total) by mouth every 8 (eight) hours as needed for muscle spasms. (Patient not taking: Reported on 05/13/2018) 20 tablet 0  . ondansetron (ZOFRAN ODT) 4 MG disintegrating tablet Take 1 tablet (4 mg total) by mouth every 8 (eight) hours as needed for nausea or vomiting. (Patient not taking: Reported on 05/13/2018) 20 tablet 0  . ondansetron (ZOFRAN) 8 MG tablet Take 1 tablet (8 mg total) by mouth every 8 (eight) hours as needed for nausea or vomiting. 20 tablet 0  . zolpidem (AMBIEN) 10 MG tablet Take 10 mg by mouth at bedtime as needed for  sleep.      Home: Home Living Family/patient expects to be discharged to:: Private residence Living Arrangements: Spouse/significant other Available Help at Discharge: Family, Industrial/product designer, Available PRN/intermittently Type of Home: House Home Access: Stairs to enter [employer] of Steps: 5 back; 4 in front Entrance Stairs-Rails: Can reach both Home Layout: One level Bathroom Shower/Tub: Chiropodist: Handicapped height Home Equipment: None  Functional History: Prior Function Level of Independence: Independent Comments: Independent with all ADL/IADL tasks and mobility; takes care of her 3 grandchildren per admit in 2019 Functional Status:  Mobility: Bed Mobility Overal bed mobility: Needs Assistance Bed Mobility: Supine to Sit Supine to sit: Min assist General bed mobility comments: assist for trunk elevation today Transfers Overall transfer level: Needs assistance Equipment used: 4-wheeled walker(EVA walker) Transfers: Sit to/from Stand Sit to Stand: Min assist, +2 physical assistance, Mod assist Stand pivot transfers: Min assist, Mod  assist, +2 physical assistance General transfer comment: asisst for boost and balance, Pt pulling up on EVA walker.  ADL: ADL Overall ADL's : Needs assistance/impaired Eating/Feeding: Moderate assistance, Bed level(in chair position) Grooming: Brushing hair, Maximal assistance, Bed level(chair position) Grooming Details (indicate cue type and reason): matting noted in hair. Shampoo cap applied and attempted to de-tangle with comb. no success today. RN aware. Pt will need detangler and significant amt of time to work on matting- potentially might need it cut out Upper Body Bathing: Minimal assistance Lower Body Bathing: Maximal assistance Upper Body Dressing : Minimal assistance Lower Body Dressing: Maximal assistance Lower Body Dressing Details (indicate cue type and reason): to don socks Toilet Transfer:  Minimal assistance, +2 for physical assistance, +2 for safety/equipment, Ambulation(EVA walker) Toilet Transfer Details (indicate cue type and reason): face to face transfer Toileting- Clothing Manipulation and Hygiene: Maximal assistance, Sit to/from stand Toileting - Clothing Manipulation Details (indicate cue type and reason): Pt with colostomy bag at baseline, no balance for perianal care Functional mobility during ADLs: +2 for safety/equipment, Minimal assistance General ADL Comments: confusion/cognition, and decreased access to LB for ADL, decreased balance  Cognition: Cognition Overall Cognitive Status: Impaired/Different from baseline Orientation Level: Oriented to person, Disoriented to place, Disoriented to time, Disoriented to situation Cognition Arousal/Alertness: Awake/alert Behavior During Therapy: Flat affect Overall Cognitive Status: Impaired/Different from baseline Area of Impairment: Memory, Following commands, Safety/judgement, Awareness, Problem solving Orientation Level: Disoriented to, Situation, Time, Place Memory: Decreased short-term memory, Decreased recall of precautions Following Commands: Follows one step commands inconsistently, Follows one step commands with increased time Safety/Judgement: Decreased awareness of safety, Decreased awareness of deficits Awareness: Intellectual Problem Solving: Slow processing, Decreased initiation, Difficulty sequencing, Requires verbal cues, Requires tactile cues General Comments: easily distracted, inreased lethargy today  Blood pressure (!) 145/132, pulse (!) 113, temperature 98 F (36.7 C), temperature source Oral, resp. rate (!) 28, height 5\' 1"  (1.549 m), weight 88.2 kg, SpO2 99 %. Physical Exam  Constitutional: No distress.  HENT:  Head: Normocephalic.  Eyes: Pupils are equal, round, and reactive to light.  Neck: Normal range of motion.  Cardiovascular:  tachycardic  Respiratory: Effort normal.  GI: Soft.   Neurological:  Patient is very lethargic but arousable.  Made eye contact but remained nonverbal.  She did not follow exams are made during assessment.  Overall exam was limited, seems to move all 4 spontaneously  Skin: Skin is warm.  Psychiatric:  Flat, slow to engage    LabResultsLast24Hours       Results for orders placed or performed during the hospital encounter of 05/12/18 (from the past 24 hour(s))  Glucose, capillary     Status: None   Collection Time: 05/21/18 12:12 PM  Result Value Ref Range   Glucose-Capillary 86 70 - 99 mg/dL  Glucose, capillary     Status: Abnormal   Collection Time: 05/21/18  4:22 PM  Result Value Ref Range   Glucose-Capillary 135 (H) 70 - 99 mg/dL   Comment 1 Notify RN   Glucose, capillary     Status: Abnormal   Collection Time: 05/21/18  8:34 PM  Result Value Ref Range   Glucose-Capillary 132 (H) 70 - 99 mg/dL  Glucose, capillary     Status: None   Collection Time: 05/22/18 12:04 AM  Result Value Ref Range   Glucose-Capillary 84 70 - 99 mg/dL  Magnesium     Status: None   Collection Time: 05/22/18  3:37 AM  Result Value Ref Range  Magnesium 2.2 1.7 - 2.4 mg/dL  CBC     Status: Abnormal   Collection Time: 05/22/18  3:37 AM  Result Value Ref Range   WBC 12.7 (H) 4.0 - 10.5 K/uL   RBC 3.29 (L) 3.87 - 5.11 MIL/uL   Hemoglobin 10.0 (L) 12.0 - 15.0 g/dL   HCT 31.2 (L) 36.0 - 46.0 %   MCV 94.8 80.0 - 100.0 fL   MCH 30.4 26.0 - 34.0 pg   MCHC 32.1 30.0 - 36.0 g/dL   RDW 13.5 11.5 - 15.5 %   Platelets 180 150 - 400 K/uL   nRBC 0.0 0.0 - 0.2 %  Basic metabolic panel     Status: Abnormal   Collection Time: 05/22/18  3:37 AM  Result Value Ref Range   Sodium 138 135 - 145 mmol/L   Potassium 5.1 3.5 - 5.1 mmol/L   Chloride 99 98 - 111 mmol/L   CO2 26 22 - 32 mmol/L   Glucose, Bld 73 70 - 99 mg/dL   BUN 65 (H) 6 - 20 mg/dL   Creatinine, Ser 5.19 (H) 0.44 - 1.00 mg/dL   Calcium 8.2 (L) 8.9 - 10.3 mg/dL    GFR calc non Af Amer 9 (L) >60 mL/min   GFR calc Af Amer 11 (L) >60 mL/min   Anion gap 13 5 - 15  Glucose, capillary     Status: Abnormal   Collection Time: 05/22/18  3:38 AM  Result Value Ref Range   Glucose-Capillary 60 (L) 70 - 99 mg/dL  Glucose, capillary     Status: Abnormal   Collection Time: 05/22/18  4:19 AM  Result Value Ref Range   Glucose-Capillary 104 (H) 70 - 99 mg/dL  Glucose, capillary     Status: None   Collection Time: 05/22/18  7:34 AM  Result Value Ref Range   Glucose-Capillary 83 70 - 99 mg/dL  Glucose, capillary     Status: Abnormal   Collection Time: 05/22/18 11:23 AM  Result Value Ref Range   Glucose-Capillary 121 (H) 70 - 99 mg/dL      ImagingResults(Last48hours)  Mr Brain Wo Contrast  Result Date: 05/21/2018 CLINICAL DATA:  Concern for anoxic injury. EXAM: MRI HEAD WITHOUT CONTRAST TECHNIQUE: Multiplanar, multiecho pulse sequences of the brain and surrounding structures were obtained without intravenous contrast. COMPARISON:  05/14/2018 FINDINGS: Brain: No infarct is seen. On FLAIR imaging and a few T2 weighted slices there is question of cortical edema, but no clear swelling/mass effect and no change in sulci appearance since admission head CT. Some of this FLAIR signal may be related to motion and oxygenation. No hydrocephalus, mass, collection, or blood products. Vascular: Major flow voids are preserved. Skull and upper cervical spine: Negative for marrow lesion Sinuses/Orbits: Opacified sphenoid sinuses and right maxillary fluid level. Patchy bilateral mastoid opacification. IMPRESSION: 1. Negative for infarct or definitive global anoxic injury. 2. Per chart a temporal lobe lesion is questioned but again not visualized. Contrast was not administered in the setting of profound acute renal failure. 3. Sinusitis. Electronically Signed   By: Monte Fantasia M.D.   On: 05/21/2018 15:28      Assessment/Plan: Diagnosis: debility and encephalopathy  1. Does the need for close, 24 hr/day medical supervision in concert with the patient's rehab needs make it unreasonable for this patient to be served in a less intensive setting? Yes 2. Co-Morbidities requiring supervision/potential complications: COPD, dCHF, renal failure on HD, dysphagia 3. Due to bladder management, bowel management, safety, skin/wound care,  disease management, medication administration, pain management and patient education, does the patient require 24 hr/day rehab nursing? Yes 4. Does the patient require coordinated care of a physician, rehab nurse, PT (1-2 hrs/day, 5 days/week), OT (1-2 hrs/day, 5 days/week) and SLP (1-2 hrs/day, 5 days/week) to address physical and functional deficits in the context of the above medical diagnosis(es)? Yes Addressing deficits in the following areas: balance, endurance, locomotion, strength, transferring, bowel/bladder control, bathing, dressing, feeding, grooming, toileting, cognition, speech, swallowing and psychosocial support 5. Can the patient actively participate in an intensive therapy program of at least 3 hrs of therapy per day at least 5 days per week? Yes 6. The potential for patient to make measurable gains while on inpatient rehab is excellent 7. Anticipated functional outcomes upon discharge from inpatient rehab are supervision and min assist  with PT, supervision and min assist with OT, supervision and min assist with SLP. 8. Estimated rehab length of stay to reach the above functional goals is: potentially 20-30 days 9. Anticipated D/C setting: Home 10. Anticipated post D/C treatments: [place] therapy 11. Overall Rehab/Functional Prognosis: excellent  RECOMMENDATIONS: This patient's condition is appropriate for continued rehabilitative care in the following setting: CIR Patient has agreed to participate in recommended program. N/A Note that insurance prior authorization may be required for reimbursement for recommended care.   Comment: Rehab Admissions Coordinator to follow up.  Thanks,  Meredith Staggers, MD, Mellody Drown  I have personally performed a face to face diagnostic evaluation of this patient. Additionally, I have examined pertinent labs and radiographic images. I have reviewed and concur with the physician assistant's documentation above.    Lavon Paganini Angiulli, PA-C 05/22/2018        Revision History              Routing History

## 2018-05-29 NOTE — Progress Notes (Signed)
Renal Navigator follow-ed up with patient's husband to inform him that patient has been accepted at [facility] and that although an exact OP HD schedule has not been confirmed, she will have a seat on second shift, TTS. Renal Navigator will let him know once seat schedule has been confirmed. Patient's husband was appreciative.  Alphonzo Cruise Renal Navigator [phone]

## 2018-05-29 NOTE — Progress Notes (Signed)
Patient information reviewed and entered into e[facility]hab System by Becky Markevius Trombetta, PPS coordinator. Information including medical coding, function ability, and quality indicators will be reviewed and updated through discharge.   

## 2018-05-29 NOTE — Evaluation (Signed)
Speech Language Pathology Assessment and Plan  Patient Details  Name: Misty Green MRN: [id number] Date of Birth: 1970/01/28  SLP Diagnosis: Cognitive Impairments;Dysphagia  Rehab Potential: Good ELOS: 5-7 days     Today's Date: 05/29/2018 SLP Individual Time: 1030-1130 SLP Individual Time Calculation (min): 60 min   Problem List:  Patient Active Problem List   Diagnosis Date Noted  . Hypoxic encephalopathy ([place]) 05/27/2018  . Anoxic brain injury ([place])   . ARF (acute renal failure) ([place])   . Lactic acidosis   . Transaminitis   . Acute on chronic respiratory failure with hypercapnia ([place])   . Cardiac arrest ([place]) 05/12/2018  . Diverticulitis of colon (without mention of hemorrhage)(562.11)   . Acute diverticulitis 01/06/2018  . Mixed anxiety and depressive disorder 12/22/2017  . Obesity 12/22/2017  . Tobacco user 12/22/2017  . Diverticulitis 12/03/2017  . Spinal stenosis of lumbar region 04/12/2016  . Acute respiratory failure ([place]) 03/22/2016  . COPD exacerbation ([place]) 03/22/2016  . Chronic back pain 03/22/2016  . Opioid type dependence, abuse ([place]) 03/22/2016  . Polycythemia 03/22/2016  . Anxiety 03/22/2016  . OSA on CPAP 03/22/2016  . Closed fracture of distal end of right radius 08/25/2015  . Closed nondisplaced fracture of styloid process of right ulna 08/25/2015   Past Medical History:  Past Medical History:  Diagnosis Date  . CHF (congestive heart failure) ([place])   . COPD (chronic obstructive pulmonary disease) ([place])   . Current smoker   . Opiate abuse, continuous ([place])    Past Surgical History:  Past Surgical History:  Procedure Laterality Date  . ABDOMINAL HYSTERECTOMY    . BACK SURGERY    . cesction    . COLOSTOMY  01/13/2018   Procedure: COLOSTOMY Creation;  Surgeon: Jules Husbands, MD;  Location: [facility] ORS;  Service: General;;  . INCISIONAL HERNIA REPAIR     lower midline laparotomy incision (hysterectomy), repaired with mesh  . IR FLUORO GUIDE  CV LINE RIGHT  05/26/2018  . IR US GUIDE VASC ACCESS RIGHT  05/26/2018  . LAPAROSCOPIC CHOLECYSTECTOMY  2012  . LAPAROSCOPIC LYSIS OF ADHESIONS  01/13/2018   Procedure: LAPAROSCOPIC LYSIS OF ADHESIONS;  Surgeon: Jules Husbands, MD;  Location: [facility] ORS;  Service: General;;  . LAPAROSCOPIC SIGMOID COLECTOMY  01/13/2018   Procedure: LAPAROSCOPIC SIGMOID COLECTOMY;  Surgeon: Jules Husbands, MD;  Location: [facility] ORS;  Service: General;;  . ORIF WRIST FRACTURE Right 08/25/2015   Procedure: OPEN REDUCTION INTERNAL FIXATION (ORIF) WRIST FRACTURE;  Surgeon: Corky Mull, MD;  Location: [facility] ORS;  Service: Orthopedics;  Laterality: Right;    Assessment / Plan / Recommendation Clinical Impression Misty Green is a 48 year old female with history of COPD, OSA- 3 L oxygen at nights, CHF, spinal stenosis who was admitted on 05/12/18 after bing found unresponsive and required 13-14 minutes of CPR by EMS. Per reports, patient had been feeling bad since 4/19 and had been sleeping for most of the day with husband checking in on her every 30-45 minutes. She was intubated in ED and started on epinephrine. She was started on IV antibiotics and steroids for acute hypoxic/hypercapneic failure with presumed aspiration event. 2 D echo showed EF 60-65% with normal systolic function. EEG done due to encephalopathy and showed discontinuous generalized background slowing. Neurology expressed concerns about recovery from hypoxic injury and CT/MRI head were negative for acute changes or anoxic injury. She developed oliguric renal failure with rise in SCr due to AKI and was started  on HD on 4/28.  She was extubated to BIPAP on 4/30 and has had issues with agitation. She has had poor renal recovery and temporary HD catheter  changed to tunneled IJ by Dr. Annamaria Boots on 5/8. Mentation improving but she continues to be limited by hypoxia, debility and cognitive deficits.  Repeat MRI brain 5/5 was negative for infarct or global anoxic injury. CIR  recommended due to functional decline.   Pt presents with higher level cognitive impairments, with include severe executive function, mild attention, mild visuospatial, and mild memory deficits, further impacted by impulsivity. The above impairments supported by subtest score from formal cognitive linguistic assessment CLQT, pt scored WFL on language and memory, however pt required cues several times to recall instruction during task. Pt demonstrated emergent awareness of errors, with no little attempt to correct errors and given verbal questioning, indicated impairment in safety awareness. Pt demonstrated sustained attention tasks with impairments noted in selective attention, primarily due to internal distractions.  SLP recommends to focus treatment sessions on pre-task planning and organization skills when completing complex problem solving tasks, as well as strategies to reduce internal distractions. Pt presents with no noted language and/or speech impairments.  Pt's oral motor and swallow appeared functional given limited trials of regular textures and thin via straw (including 3oz water test), with appropriate oral clearance, swallow initiation appeared timely and no overt s/s aspiration. SLP recommended a diet upgrade to regular with thin liquids and full supervision A, until SLP has assessed diet during a meal or with a larger quantity. Pt would benefit from skilled ST services in order to maximize functional independence and reduce burden of care, likely requiring 24 hour supervision and continue ST services.   Skilled Therapeutic Interventions          Skilled ST services focused on cognitive skills. SLP administered cognitive linguistic assessment, educated pt on results and created plan to address deficits. All questions were answered to satisfaction. Pt was left in room with call bell within reach and bed alarm set. ST recommends to continue skilled ST services.  SLP Assessment  Patient will need  skilled Speech Lanaguage Pathology Services during CIR admission    Recommendations  SLP Diet Recommendations: Thin Medication Administration: Whole meds with liquid Supervision: Patient able to self feed;Full supervision/cueing for compensatory strategies(due to diet upgrade ) Compensations: Minimize environmental distractions;Slow rate;Small sips/bites Postural Changes and/or Swallow Maneuvers: Seated upright 90 degrees;Upright 30-60 min after meal Oral Care Recommendations: Oral care BID Patient destination: Home Follow up Recommendations: Home Health SLP;24 hour supervision/assistance Equipment Recommended: None recommended by SLP    SLP Frequency 3 to 5 out of 7 days   SLP Duration  SLP Intensity  SLP Treatment/Interventions 5-7 days   Minumum of 1-2 x/day, 30 to 90 minutes  Cognitive remediation/compensation;Cueing hierarchy;Dysphagia/aspiration precaution training;Functional tasks;Medication managment    Pain Pain Assessment Pain Scale: 0-10 Pain Score: 0-No pain  Prior Functioning Cognitive/Linguistic Baseline: Information not available Type of Home: Mobile home  Lives With: Spouse Available Help at Discharge: Family Education: 12th Vocation: Unemployed  Short Term Goals: Week 1: SLP Short Term Goal 1 (Week 1): STG=LTG due to short ELOS  Refer to Care Plan for Long Term Goals  Recommendations for other services: None   Discharge Criteria: Patient will be discharged from SLP if patient refuses treatment 3 consecutive times without medical reason, if treatment goals not met, if there is a change in medical status, if patient makes no progress towards goals or if patient is  discharged from hospital.  The above assessment, treatment plan, treatment alternatives and goals were discussed and mutually agreed upon: by patient  Navdeep Fessenden  [facility] 05/29/2018, 5:43 PM

## 2018-05-29 NOTE — Progress Notes (Signed)
Patient returned from HD.  Says she feels sick on her stomach.  HD nurse gave compazine.  Her symptoms are improving at this time.  Denies any needs at this time.  Brita Romp, RN

## 2018-05-29 NOTE — Progress Notes (Signed)
[facility] PHYSICAL MEDICINE & REHABILITATION PROGRESS NOTE   Subjective/Complaints: Slept much better with seroquel. Still having pain in chest from CPR. Overall feeling better  ROS: Patient denies fever, rash, sore throat, blurred vision, nausea, vomiting, diarrhea, cough, shortness of breath or chest pain, joint or back pain, headache, or mood change.     Objective:   No results found. Recent Labs    05/27/18 0521  WBC 10.1  HGB 10.6*  HCT 33.6*  PLT 202   Recent Labs    05/27/18 0521  NA 135  K 3.7  CL 93*  CO2 25  GLUCOSE 96  BUN 20  CREATININE 2.86*  CALCIUM 8.8*    Intake/Output Summary (Last 24 hours) at 05/29/2018 1005 Last data filed at 05/29/2018 0700 Gross per 24 hour  Intake 720 ml  Output -  Net 720 ml     Physical Exam: Vital Signs Blood pressure (!) 148/75, pulse 89, temperature 98.5 F (36.9 C), temperature source Oral, resp. rate 18, height 5\' 1"  (1.549 m), weight 80.5 kg, SpO2 97 %.  Constitutional: No distress . Vital signs reviewed. HEENT: EOMI, oral membranes moist Neck: supple Cardiovascular: RRR without murmur. No JVD    Respiratory: CTA Bilaterally without wheezes or rales. Normal effort, chest wall  GI: BS +, non-tender, non-distended  Musculoskeletal:Normal range of motion.  General: No edema.  Neurological: She isalert. Oriented to cone/gso, month, year.   Follows basic commands. Moves all 4's 4- /5. Senses pain in all 4's. Skin: Skin iswarm. Catheter in place on chest wall. Bruising on abdomen Psychiatric:more relaxed   Assessment/Plan: 1. Functional deficits secondary to anoxic encephalopathy, debility which require 3+ hours per day of interdisciplinary therapy in a comprehensive inpatient rehab setting.  Physiatrist is providing close team supervision and 24 hour management of active medical problems listed below.  Physiatrist and rehab team continue to assess barriers to discharge/monitor patient progress  toward functional and medical goals  Care Tool:  Bathing  Bathing activity did not occur: Safety/medical concerns           Bathing assist       Upper Body Dressing/Undressing Upper body dressing   What is the patient wearing?: Pull over shirt    Upper body assist Assist Level: Contact Guard/Touching assist    Lower Body Dressing/Undressing Lower body dressing      What is the patient wearing?: Pants     Lower body assist Assist for lower body dressing: Minimal Assistance - Patient > 75%     Toileting Toileting    Toileting assist Assist for toileting: Minimal Assistance - Patient > 75%     Transfers Chair/bed transfer  Transfers assist     Chair/bed transfer assist level: Contact Guard/Touching assist     Locomotion Ambulation   Ambulation assist      Assist level: Contact Guard/Touching assist Assistive device: Walker-rolling Max distance: 76'   Walk 10 feet activity   Assist     Assist level: Contact Guard/Touching assist Assistive device: Walker-rolling   Walk 50 feet activity   Assist    Assist level: Contact Guard/Touching assist Assistive device: Walker-rolling    Walk 150 feet activity   Assist Walk 150 feet activity did not occur: Safety/medical concerns(decreased strength/endurance)         Walk 10 feet on uneven surface  activity   Assist     Assist level: Contact Guard/Touching assist Assistive device: Aeronautical engineer Will patient use  wheelchair at discharge?: Yes Type of Wheelchair: Manual    Wheelchair assist level: Supervision/Verbal cueing Max wheelchair distance: 61'    Wheelchair 50 feet with 2 turns activity    Assist        Assist Level: Supervision/Verbal cueing   Wheelchair 150 feet activity     Assist Wheelchair 150 feet activity did not occur: Safety/medical concerns(decreased strength/endurance)        Medical Problem List and  Plan: 1.Anoxic encephalopath and debilitysecondary tocardiac arrest from acute on chronic respiratory failure. --Continue CIR therapies including PT, OT, and SLP  2. Antithrombotics: -DVT/anticoagulation:Pharmaceutical:Heparin -antiplatelet therapy: N/A 3. Pain Management:Tylenol prn  -anxiety mgt   -heat and ice to sternum 4. Mood: team providing ego support -antipsychotic agents:  low dose seroquel at night to help with restlessness and sleep  -lexapro and xanax for anxiety 5. Neuropsych: This patientis notcapable of making decisions on hisown behalf. 6. Skin/Wound Care:routine pressure relief measures. 7. Fluids/Electrolytes/Nutrition:Strict I/O. Renal diet with 1200 cc FR.    -dietary ed by team 8. COPD with acute on chronic respiratory failure:   -oxygen as needed via [place] to keep sats 90%  -Encourage pulmonary hygiene/flutter valve use. Continue  duonebs tid.   -prednisone 40mg  today then down to 30mg .    9. OSA:Now requires BIPAPwith oxygen at nights.  10 Spinal stenosis/chronic pain:Used tylenol prn at home. 11. Acute renal failure/HD dependent: HD to continue  -Cr dropped to 2.86 on 5/9   -continue HD per nephro today 12. HTN: Controlled on Norvasc. Monitor BID. 13. Anemia of chronic disease:Stable overall. Continue to monitor     LOS: 2 days A FACE TO [place] 05/29/2018, 10:05 AM

## 2018-05-29 NOTE — Progress Notes (Signed)
Patient rescheduled for HD this afternoon.  Missed 30 minutes of OT due to change of schedule.

## 2018-05-29 NOTE — Progress Notes (Signed)
Occupational Therapy Session Note  Patient Details  Name: Misty Green MRN: [id number] Date of Birth: [birth year]  Today's Date: 05/29/2018 OT Individual Time: 8676-7209 OT Individual Time Calculation (min): 75 min    Short Term Goals: Week 1:  OT Short Term Goal 1 (Week 1): STG=LTG d/t ELOS  Skilled Therapeutic Interventions/Progress Updates:    Pt received in bed, bathed and dressed for the day. She expressed desire to wash hair to get rid of hair mat.  Pt ambulated with CGA to sink to sit in chair facing away from sink with shampoo tray. Pt helped to hold tray in place while therapist washed hair.  Pt has a large hair mat that did not loosen even with deep conditioning and combing.   She stood at sink to apply toothpaste to brush but then needed to sit due to fatigue to continue brushing teeth. Worked on shaving from seated position.   Pt began feeling very tired and she felt it was from her prendisone.  Pt requested to sit on EOB and felt she needed something to eat.  Brought her PB and crackers and diet soda.    SLP arrived for her next session.  Therapy Documentation Precautions:  Precautions Precautions: Fall Precaution Comments: on 3L 02 at night at baseline, ostomy Restrictions Weight Bearing Restrictions: No Vital Signs: Oxygen Therapy SpO2: 97 % O2 Device: Room Air Pain: Pain Assessment Pain Scale: 0-10 Pain Score: 0-No pain  Therapy/Group: Individual Therapy  [place] 05/29/2018, 10:18 AM

## 2018-05-30 ENCOUNTER — Inpatient Hospital Stay (HOSPITAL_COMMUNITY): Payer: 59 | Admitting: Occupational Therapy

## 2018-05-30 ENCOUNTER — Inpatient Hospital Stay (HOSPITAL_COMMUNITY): Payer: 59

## 2018-05-30 ENCOUNTER — Inpatient Hospital Stay (HOSPITAL_COMMUNITY): Payer: 59 | Admitting: Speech Pathology

## 2018-05-30 NOTE — Progress Notes (Signed)
Occupational Therapy Session Note  Patient Details  Name: Misty Green MRN: [id number] Date of Birth: 08-01-1970  Today's Date: 05/30/2018 OT Individual Time: 0700-0729 OT Individual Time Calculation (min): 29 min    Short Term Goals: Week 1:  OT Short Term Goal 1 (Week 1): STG=LTG d/t ELOS  Skilled Therapeutic Interventions/Progress Updates:    Upon entering the room, pt seated in center of bed and leaning forward secondary to reported stomach and chest. Pt receiving medication prior to OT arrival. Pt requesting to transfer to recliner chair. Pt needing min A to stand from EOB and ambulating with RW to recliner chair with steady assistance and min cuing for safety with RW advancement. Pt seated in reclined chair with continued reports of chest pain most likely due to CPR. Pt given heat to hold to chest with pt reports comfort from heat. Pt unable to engage further this session secondary to increased pain but felt that she could tolerate sitting in recliner chair. SLP arriving and pt transitioned without issue. Call bell and all needed items within reach.   Therapy Documentation Precautions:  Precautions Precautions: Fall Precaution Comments: on 3L 02 at night at baseline, ostomy Restrictions Weight Bearing Restrictions: No General:   Vital Signs: Therapy Vitals Temp: 98.5 F (36.9 C) Temp Source: Oral Pulse Rate: 78 Resp: 18 BP: 132/73 Patient Position (if appropriate): Sitting Oxygen Therapy SpO2: 99 % O2 Device: Nasal Cannula Pain: Pain Assessment Pain Scale: 0-10 Pain Score: 7  Pain Type: Acute pain Pain Location: Chest Pain Frequency: Constant Pain Onset: On-going Pain Intervention(s): Medication (See eMAR) ADL:   Vision   Perception    Praxis   Exercises:   Other Treatments:     Therapy/Group: Individual Therapy  Gypsy Decant 05/30/2018, 7:30 AM

## 2018-05-30 NOTE — Progress Notes (Signed)
Speech Language Pathology Daily Session Note  Patient Details  Name: Misty Green MRN: [id number] Date of Birth: 11/28/1970  Today's Date: 05/30/2018 SLP Individual Time: 6384-6659 SLP Individual Time Calculation (min): 45 min  Short Term Goals: Week 1: SLP Short Term Goal 1 (Week 1): STG=LTG due to short ELOS  Skilled Therapeutic Interventions: Skilled treatment session focused on dysphagia and cognitive goals. SLP facilitated session by providing skilled observation with breakfast meal of regular textures with thin liquids. Patient consumed meal without overt s/s of aspiration and was overall Mod I for use of swallowing compensatory strategies. Therefore, recommend patient continue current diet and change to intermittent supervision with meals. SLP also facilitated session by providing Mod A verbal cues for alternating attention between self-feeding and a functional conversation that focused on recall. Patient required Min A verbal cues to recall events from previous therapy sessions. Patient left upright in recliner with all needs within reach. Continue with current plan of care.      Pain Pain Assessment Pain Scale: 0-10 Pain Score: 2   Therapy/Group: Individual Therapy  Keyaira Clapham 05/30/2018, 3:19 PM

## 2018-05-30 NOTE — Progress Notes (Signed)
[facility] PHYSICAL MEDICINE & REHABILITATION PROGRESS NOTE   Subjective/Complaints: Had a difficult time last night as she said "too much fluid was taken off in HD". Feeling a little better this morning besides ongoing sternal discomfort  ROS: Patient denies fever, rash, sore throat, blurred vision, nausea, vomiting, diarrhea, cough, shortness of breath  headache, or mood change.    Objective:   No results found. Recent Labs    05/29/18 1516  WBC 8.6  HGB 10.5*  HCT 33.3*  PLT 241   Recent Labs    05/29/18 1516  NA 136  K 4.4  CL 95*  CO2 28  GLUCOSE 131*  BUN 11  CREATININE 1.41*  CALCIUM 8.4*    Intake/Output Summary (Last 24 hours) at 05/30/2018 9211 Last data filed at 05/30/2018 0830 Gross per 24 hour  Intake 240 ml  Output 2800 ml  Net -2560 ml     Physical Exam: Vital Signs Blood pressure 132/73, pulse 78, temperature 98.5 F (36.9 C), temperature source Oral, resp. rate 18, height 5\' 1"  (1.549 m), weight 79.2 kg, SpO2 99 %.  Constitutional: No distress . Vital signs reviewed. HEENT: EOMI, oral membranes moist Neck: supple Cardiovascular: RRR without murmur. No JVD    Respiratory: CTA Bilaterally without wheezes or rales. Normal effort    GI: BS +, non-tender, non-distended   Musculoskeletal:Normal range of motion. pain with palp on sternum General: No edema.  Neurological: She isalert. Oriented to person/place/reason/month/year. Moves all 4's 4- /5. Senses pain in all 4's. Skin: Skin iswarm. Catheter in place on chest wall. Bruising on abdomen Psychiatric:generally pleasant and cooperative   Assessment/Plan: 1. Functional deficits secondary to anoxic encephalopathy, debility which require 3+ hours per day of interdisciplinary therapy in a comprehensive inpatient rehab setting.  Physiatrist is providing close team supervision and 24 hour management of active medical problems listed below.  Physiatrist and rehab team continue to assess  barriers to discharge/monitor patient progress toward functional and medical goals  Care Tool:  Bathing  Bathing activity did not occur: Safety/medical concerns           Bathing assist       Upper Body Dressing/Undressing Upper body dressing   What is the patient wearing?: Pull over shirt    Upper body assist Assist Level: Contact Guard/Touching assist    Lower Body Dressing/Undressing Lower body dressing      What is the patient wearing?: Pants     Lower body assist Assist for lower body dressing: Minimal Assistance - Patient > 75%     Toileting Toileting    Toileting assist Assist for toileting: Minimal Assistance - Patient > 75%(established ostomy- performs 50-75% of ostomy care)     Transfers Chair/bed transfer  Transfers assist     Chair/bed transfer assist level: Contact Guard/Touching assist     Locomotion Ambulation   Ambulation assist      Assist level: Contact Guard/Touching assist Assistive device: Walker-rolling Max distance: 69'   Walk 10 feet activity   Assist     Assist level: Contact Guard/Touching assist Assistive device: Walker-rolling   Walk 50 feet activity   Assist    Assist level: Contact Guard/Touching assist Assistive device: Walker-rolling    Walk 150 feet activity   Assist Walk 150 feet activity did not occur: Safety/medical concerns(decreased strength/endurance)         Walk 10 feet on uneven surface  activity   Assist     Assist level: Contact Guard/Touching assist Assistive device: Walker-rolling  Wheelchair     Assist Will patient use wheelchair at discharge?: Yes Type of Wheelchair: Manual    Wheelchair assist level: Supervision/Verbal cueing Max wheelchair distance: 53'    Wheelchair 50 feet with 2 turns activity    Assist        Assist Level: Supervision/Verbal cueing   Wheelchair 150 feet activity     Assist Wheelchair 150 feet activity did not occur:  Safety/medical concerns(decreased strength/endurance)        Medical Problem List and Plan: 1.Anoxic encephalopath and debilitysecondary tocardiac arrest from acute on chronic respiratory failure. --Continue CIR therapies including PT, OT, and SLP   2. Antithrombotics: -DVT/anticoagulation:Pharmaceutical:Heparin -antiplatelet therapy: N/A 3. Pain Management:Tylenol prn  -anxiety mgt   -heat and ice to sternum, reassured her it will improve  4. Mood: team providing ego support -antipsychotic agents:  low dose seroquel at night to help with restlessness and sleep  -lexapro and xanax for anxiety 5. Neuropsych: This patientis notcapable of making decisions on hisown behalf. 6. Skin/Wound Care:routine pressure relief measures. 7. Fluids/Electrolytes/Nutrition:Strict I/O. Renal diet with 1200 cc FR.    -ongoing dietary ed 8. COPD with acute on chronic respiratory failure:   -oxygen as needed via [place] to keep sats 90%---wean as possible  -used at HS only at home  -Encourage pulmonary hygiene/flutter valve use. Continue  duonebs tid.   -prednisone 30mg  qd x 2 days then 20mg  qd x 2 days and so on until off. .    9. OSA:Now requires BIPAPwith oxygen at nights.  10 Spinal stenosis/chronic pain:Used tylenol prn at home. 11. Acute renal failure/HD dependent: HD to continue  -Cr down to 1.41 yesterday!  -UO 800 cc yesterday  -continue hd, mgt per nephro  12. HTN: Controlled on Norvasc. Monitor BID.  -controlled 5/12 13. Anemia of chronic disease:Stable overall. Continue to monitor     LOS: 3 days A FACE TO [place] 05/30/2018, 9:23 AM

## 2018-05-30 NOTE — Progress Notes (Signed)
Occupational Therapy Session Note  Patient Details  Name: Misty Green MRN: [id number] Date of Birth: 09-10-1970  Today's Date: 05/30/2018 OT Individual Time: 5397-6734 OT Individual Time Calculation (min): 75 min    Short Term Goals: Week 1:  OT Short Term Goal 1 (Week 1): STG=LTG d/t ELOS  Skilled Therapeutic Interventions/Progress Updates:    Patient seated in recliner - states that she is feeling better but had a rough night last night.  She is upset and tearful about her matted hair.   Functional ambulation and transfers to/from recliner, bed, arm chair with RW in room CGA.  She completed sponge bath seated in chair at sink with set up/CGA in stance.  UB and LB dressing with set up/supervision.  Oral care mod I seated in chair.  Patient participated in hair care using detangling products, shower cap and conditioner both seated and in stance with CGA - unable to make progress with working out the knots/matted hair.  Patient returned to bed at close of session with bed alarm set and call bell in reach.    Therapy Documentation Precautions:  Precautions Precautions: Fall Precaution Comments: on 3L 02 at night at baseline, ostomy Restrictions Weight Bearing Restrictions: No General:   Vital Signs:   Pain: Pain Assessment Pain Scale: 0-10 Pain Score: 2  Faces Pain Scale: Hurts a little bit Pain Type: Acute pain Pain Location: Chest Pain Orientation: Mid Pain Descriptors / Indicators: Aching Pain Onset: On-going Pain Intervention(s): Repositioned    Other Treatments:     Therapy/Group: Individual Therapy  Carlos Levering 05/30/2018, 12:12 PM

## 2018-05-30 NOTE — Plan of Care (Signed)
  Problem: RH BOWEL ELIMINATION Goal: RH STG MANAGE BOWEL WITH ASSISTANCE Description STG Manage Bowel with Assistance. Mod I  Outcome: Progressing   Problem: RH BLADDER ELIMINATION Goal: RH STG MANAGE BLADDER WITH ASSISTANCE Description STG Manage Bladder With Assistance. Mod I  Outcome: Progressing   Problem: RH SKIN INTEGRITY Goal: RH STG ABLE TO PERFORM INCISION/WOUND CARE W/ASSISTANCE Description STG Able To Perform Incision/Wound Care With Assistance. Mod I  Outcome: Progressing   Problem: RH SAFETY Goal: RH STG ADHERE TO SAFETY PRECAUTIONS W/ASSISTANCE/DEVICE Description STG Adhere to Safety Precautions With Assistance/Device. Mod I  Outcome: Progressing   Problem: RH KNOWLEDGE DEFICIT GENERAL Goal: RH STG INCREASE KNOWLEDGE OF SELF CARE AFTER HOSPITALIZATION Description Patient will be able to verbalize self care at home with cues/handouts  Outcome: Progressing

## 2018-05-30 NOTE — Progress Notes (Signed)
[facility]    NEPHROLOGY PROGRESS NOTE  SUBJECTIVE: Felt very nauseated after HD yesterday - UF was 2L and she states she does not tolerate > 1.5L.  UOP was 845mL yesterday.  Denies any chest pain, shortness of breath, nausea, vomiting, diarrhea or dysuria.   All other review of systems are negative.   OBJECTIVE:  Vitals:   05/30/18 0527 05/30/18 0737  BP: 132/73   Pulse: 78   Resp: 18   Temp: 98.5 F (36.9 C)   SpO2: 99% 99%    Intake/Output Summary (Last 24 hours) at 05/30/2018 1033 Last data filed at 05/30/2018 0830 Gross per 24 hour  Intake 240 ml  Output 2800 ml  Net -2560 ml      General:  AAOx3 NAD seated at bedside sink having hair washed HEENT: MMM [place] AT anicteric sclera Neck:  No JVD CV:  Heart RRR  Lungs:  Normal WOB Abd:  abd SNT/ND with normal BS GU:  No foley Extremities: Trace bilateral lower extremity edema. Skin:  No skin rash  MEDICATIONS:  . ALPRAZolam  0.25 mg Oral TID  . amLODipine  10 mg Oral Daily  . Chlorhexidine Gluconate Cloth  6 each Topical Q0600  . escitalopram  10 mg Oral Daily  . feeding supplement (NEPRO CARB STEADY)  237 mL Oral TID BM  . heparin  5,000 Units Subcutaneous Q8H  . ipratropium-albuterol  3 mL Nebulization TID  . pantoprazole  40 mg Oral Daily  . predniSONE  30 mg Oral Q breakfast  . QUEtiapine  25 mg Oral QHS       LABS:   CBC Latest Ref Rng & Units 05/29/2018 05/27/2018 05/25/2018  WBC 4.0 - 10.5 K/uL 8.6 10.1 10.4  Hemoglobin 12.0 - 15.0 g/dL 10.5(L) 10.6(L) 9.8(L)  Hematocrit 36.0 - 46.0 % 33.3(L) 33.6(L) 31.7(L)  Platelets 150 - 400 K/uL 241 202 244    CMP Latest Ref Rng & Units 05/29/2018 05/27/2018 05/26/2018  Glucose 70 - 99 mg/dL 131(H) 96 91  BUN 6 - 20 mg/dL 11 20 53(H)  Creatinine 0.44 - 1.00 mg/dL 1.41(H) 2.86(H) 5.64(H)  Sodium 135 - 145 mmol/L 136 135 137  Potassium 3.5 - 5.1 mmol/L 4.4 3.7 4.0  Chloride 98 - 111 mmol/L 95(L) 93(L) 97(L)  CO2 22 - 32 mmol/L 28 25 27   Calcium 8.9 -  10.3 mg/dL 8.4(L) 8.8(L) 8.9  Total Protein 6.5 - 8.1 g/dL - - -  Total Bilirubin 0.3 - 1.2 mg/dL - - -  Alkaline Phos 38 - 126 U/L - - -  AST 15 - 41 U/L - - -  ALT 0 - 44 U/L - - -    Lab Results  Component Value Date   CALCIUM 8.4 (L) 05/29/2018   CAION 1.16 05/19/2018   PHOS 1.4 (L) 05/29/2018       Component Value Date/Time   COLORURINE YELLOW 05/21/2018 0901   APPEARANCEUR HAZY (A) 05/21/2018 0901   LABSPEC 1.009 05/21/2018 0901   PHURINE 6.0 05/21/2018 0901   GLUCOSEU NEGATIVE 05/21/2018 0901   HGBUR SMALL (A) 05/21/2018 0901   BILIRUBINUR NEGATIVE 05/21/2018 0901   KETONESUR NEGATIVE 05/21/2018 0901   PROTEINUR 100 (A) 05/21/2018 0901   NITRITE NEGATIVE 05/21/2018 0901   LEUKOCYTESUR NEGATIVE 05/21/2018 0901      Component Value Date/Time   PHART 7.340 (L) 05/21/2018 0305   PCO2ART 53.0 (H) 05/21/2018 0305   PO2ART 100 05/21/2018 0305   HCO3 27.9 05/21/2018 0305   TCO2 27  05/19/2018 0454   ACIDBASEDEF 1.1 05/20/2018 1240   O2SAT 96.4 05/21/2018 0305       Component Value Date/Time   FERRITIN 6,898 (H) 05/12/2018 2135       ASSESSMENT/PLAN:    48 year old female patient status post cardiac arrest of unknown duration, vent dependent respiratory failure, and anuric acute kidney injury, now dialysis dependent.  Baseline creatinine 0.7 from January 17, 2018 and 1.4 on arrival to the hospital on 05/12/2018.  1.  Acute kidney injury likely secondary to ischemic ATN.  Kidney ultrasound on 4/27 with no obstruction.  Sub-nephrotic proteinuria noted.  Has been on dialysis since 4/28. [place] placed 05/26/18.  She has increased UOP with 839mL yesterday.  Will hold on writing HD orders for tomorrow to allow assessment for ongoing need based on labs and UOP.   Ultimately, has an outpatient seat in [place] on Tuesday, Thursday, and Saturday.  Will transition to this prior to discharge if she continues to need dialysis.  2.  Cardiac arrest.  Presumed due to a respiratory  event.  3.  Acute on chronic respiratory failure with hypoxia.  Oxygenating well on nasal cannula.  4.  Aspiration pneumonia and COPD exacerbation.  Status post antibiotics.  5.  Anemia.  Hemoglobin is around 9.8-10.  Continue to monitor.  Iron stores are appropriate.  6.  Hyperphosphatemia.  Will continue binder.   7.  Hypertension.  Continue amlodipine.  Jannifer Hick MD [facility] Pager [phone]

## 2018-05-30 NOTE — Progress Notes (Signed)
Physical Therapy Session Note  Patient Details  Name: Misty Green MRN: [id number] Date of Birth: 03-02-1970  Today's Date: 05/30/2018 PT Individual Time: 1520-1545 PT Individual Time Calculation (min): 25 min  and Today's Date: 05/30/2018 PT Missed Time: 35 Minutes Missed Time Reason: Nursing care  Short Term Goals: Week 1:  PT Short Term Goal 1 (Week 1): STG=LTG due to short ELOS.  Skilled Therapeutic Interventions/Progress Updates:    Pt supine in bed upon PT arrival, agreeable to therapy tx and denies pain. Pt crying upon PT arrival, pt explains that he is very upset about what has happened to her hair since she has been in the hospital. Therapist provided emotional support and offered to work on her hair, pt reports it is too painful from other therapists working on it today. Pt transferred to sitting with supervision. Pt ambulated x 100 ft to the dayroom with RW and CGA, pt on 2LO2/min.  Pt performed x 6 sit<>stands for strengthening with min assist and cues for techniques. Nursing director and RN present to discussed pt's concerns about her hair, RN willing to work on fixing pt's hair. Pt ambulated back to room with RW and CGA. Pt missed 35 minutes of skilled therapy tx secondary to RN care.   Therapy Documentation Precautions:  Precautions Precautions: Fall Precaution Comments: on 3L 02 at night at baseline, ostomy Restrictions Weight Bearing Restrictions: No   Therapy/Group: Individual Therapy  Netta Corrigan, PT, DPT 05/30/2018, 3:18 PM

## 2018-05-31 ENCOUNTER — Inpatient Hospital Stay (HOSPITAL_COMMUNITY): Payer: 59 | Admitting: Occupational Therapy

## 2018-05-31 ENCOUNTER — Inpatient Hospital Stay (HOSPITAL_COMMUNITY): Payer: 59

## 2018-05-31 DIAGNOSIS — J441 Chronic obstructive pulmonary disease with (acute) exacerbation: Secondary | ICD-10-CM

## 2018-05-31 DIAGNOSIS — F411 Generalized anxiety disorder: Secondary | ICD-10-CM

## 2018-05-31 DIAGNOSIS — N179 Acute kidney failure, unspecified: Secondary | ICD-10-CM

## 2018-05-31 DIAGNOSIS — D638 Anemia in other chronic diseases classified elsewhere: Secondary | ICD-10-CM

## 2018-05-31 DIAGNOSIS — Z9981 Dependence on supplemental oxygen: Secondary | ICD-10-CM

## 2018-05-31 DIAGNOSIS — T380X5A Adverse effect of glucocorticoids and synthetic analogues, initial encounter: Secondary | ICD-10-CM

## 2018-05-31 DIAGNOSIS — Z992 Dependence on renal dialysis: Secondary | ICD-10-CM

## 2018-05-31 DIAGNOSIS — R739 Hyperglycemia, unspecified: Secondary | ICD-10-CM

## 2018-05-31 LAB — BASIC METABOLIC PANEL
Anion gap: 12 (ref 5–15)
BUN: 36 mg/dL — ABNORMAL HIGH (ref 6–20)
CO2: 27 mmol/L (ref 22–32)
Calcium: 9.4 mg/dL (ref 8.9–10.3)
Chloride: 102 mmol/L (ref 98–111)
Creatinine, Ser: 2.38 mg/dL — ABNORMAL HIGH (ref 0.44–1.00)
GFR calc Af Amer: 27 mL/min — ABNORMAL LOW (ref >60)
GFR calc non Af Amer: 23 mL/min — ABNORMAL LOW (ref >60)
Glucose, Bld: 91 mg/dL (ref 70–99)
Potassium: 3.8 mmol/L (ref 3.5–5.1)
Sodium: 141 mmol/L (ref 135–145)

## 2018-05-31 LAB — CBC
HCT: 32.3 % — ABNORMAL LOW (ref 36.0–46.0)
Hemoglobin: 10 g/dL — ABNORMAL LOW (ref 12.0–15.0)
MCH: 29.8 pg (ref 26.0–34.0)
MCHC: 31 g/dL (ref 30.0–36.0)
MCV: 96.1 fL (ref 80.0–100.0)
Platelets: 216 10*3/uL (ref 150–400)
RBC: 3.36 MIL/uL — ABNORMAL LOW (ref 3.87–5.11)
RDW: 14.2 % (ref 11.5–15.5)
WBC: 8.9 10*3/uL (ref 4.0–10.5)
nRBC: 0 % (ref 0.0–0.2)

## 2018-05-31 MED ORDER — PREDNISONE 20 MG PO TABS
20.0000 mg | ORAL_TABLET | Freq: Every day | ORAL | Status: DC
  Administered 2018-06-01 – 2018-06-02 (×2): 20 mg via ORAL
  Filled 2018-05-31 (×2): qty 1

## 2018-05-31 NOTE — Progress Notes (Signed)
Occupational Therapy Session Note  Patient Details  Name: Misty Green MRN: [id number] Date of Birth: 16-Mar-1970  Today's Date: 05/31/2018 OT Individual Time: 6568-1275 OT Individual Time Calculation (min): 60 min    Short Term Goals: Week 1:  OT Short Term Goal 1 (Week 1): STG=LTG d/t ELOS  Skilled Therapeutic Interventions/Progress Updates:    Pt received in route to bathroom with NT. Close (S) provided as pt finished toileting tasks and washed hands at sink in standing. Pt used RW to complete 100 ft of functional mobility, requiring seated rest break. Pt initially on RA- desaturation to 87% following several minutes of rest break. Pt placed on initially 1 L O2 and then 2L when saturation would not rise > 91% with pursed lip breathing. On 2L [place] pt rose to 95% following another 100 ft of functional mobility. Pt completed transfer into tub with CGA and demo/edu provided re different pieces of DME to use with showers. Pt sat EOM and completed sit <> stands with functional reaching. Pt then completed sit <> stands with focus on eccentric control during stand > sit without UE support. SpO2 remained >94% on 2LNC. Pt returned to room, completing 250 ft of functional mobility without any rest breaks, (S) with RW. Pt left sitting EOB with bed alarm set and all needs met. No c/o pain.   Therapy Documentation Precautions:  Precautions Precautions: Fall Precaution Comments: on 3L 02 at night at baseline, ostomy Restrictions Weight Bearing Restrictions: No   Therapy/Group: Individual Therapy  Curtis Sites 05/31/2018, 10:45 AM

## 2018-05-31 NOTE — Progress Notes (Signed)
[facility] [place] PHYSICAL MEDICINE & REHABILITATION PROGRESS NOTE   Subjective/Complaints: Patient seen sitting up in bed this morning.  She is working with therapies.  She states she slept fairly overnight.  She slept fairly well per sleep chart.  She has questions about dialysis and notes that too much fluid was removed last time.  She complains of a headache from trying to untangle her hair yesterday.  Discussed with therapies weaning supplemental oxygen.  She notes she is voiding well.  ROS: + Headache.  Denies CP, SOB, N/V/D  Objective:   No results found. Recent Labs    05/29/18 1516 05/31/18 0522  WBC 8.6 8.9  HGB 10.5* 10.0*  HCT 33.3* 32.3*  PLT 241 216   Recent Labs    05/29/18 1516 05/31/18 0522  NA 136 141  K 4.4 3.8  CL 95* 102  CO2 28 27  GLUCOSE 131* 91  BUN 11 36*  CREATININE 1.41* 2.38*  CALCIUM 8.4* 9.4    Intake/Output Summary (Last 24 hours) at 05/31/2018 1228 Last data filed at 05/31/2018 0700 Gross per 24 hour  Intake 360 ml  Output 1100 ml  Net -740 ml     Physical Exam: Vital Signs Blood pressure 134/73, pulse 89, temperature 98.7 F (37.1 C), temperature source Oral, resp. rate 20, height 5\' 1"  (1.549 m), weight 79.1 kg, SpO2 91 %.  Constitutional: No distress . Vital signs reviewed. HENT: Normocephalic.  Atraumatic. Eyes: EOMI. No discharge. Cardiovascular: No JVD. Respiratory: Normal effort. GI: Non-distended. Musc: MSK sternal pain Neurological: She isalert and oriented. Motor: 4+/5 throughout grossly Skin: Warm and dry.  Intact. Psychiatric:generally pleasant and cooperative   Assessment/Plan: 1. Functional deficits secondary to anoxic encephalopathy, debility which require 3+ hours per day of interdisciplinary therapy in a comprehensive inpatient rehab setting.  Physiatrist is providing close team supervision and 24 hour management of active medical problems listed below.  Physiatrist and rehab team continue to assess barriers  to discharge/monitor patient progress toward functional and medical goals  Care Tool:  Bathing  Bathing activity did not occur: Safety/medical concerns Body parts bathed by patient: Right arm, Left arm, Chest, Abdomen, Front perineal area, Buttocks, Right upper leg, Left upper leg, Right lower leg, Left lower leg, Face         Bathing assist Assist Level: Contact Guard/Touching assist     Upper Body Dressing/Undressing Upper body dressing   What is the patient wearing?: Pull over shirt    Upper body assist Assist Level: Set up assist    Lower Body Dressing/Undressing Lower body dressing      What is the patient wearing?: Pants, Underwear/pull up     Lower body assist Assist for lower body dressing: Contact Guard/Touching assist     Toileting Toileting    Toileting assist Assist for toileting: Minimal Assistance - Patient > 75%(established ostomy- performs 50-75% of ostomy care)     Transfers Chair/bed transfer  Transfers assist     Chair/bed transfer assist level: Contact Guard/Touching assist     Locomotion Ambulation   Ambulation assist      Assist level: Contact Guard/Touching assist Assistive device: Walker-rolling Max distance: 100 ft   Walk 10 feet activity   Assist     Assist level: Contact Guard/Touching assist Assistive device: Walker-rolling   Walk 50 feet activity   Assist    Assist level: Contact Guard/Touching assist Assistive device: Walker-rolling    Walk 150 feet activity   Assist Walk 150 feet activity did not occur:  Safety/medical concerns(decreased strength/endurance)         Walk 10 feet on uneven surface  activity   Assist     Assist level: Contact Guard/Touching assist Assistive device: Aeronautical engineer Will patient use wheelchair at discharge?: Yes Type of Wheelchair: Manual    Wheelchair assist level: Supervision/Verbal cueing Max wheelchair distance: 13'     Wheelchair 50 feet with 2 turns activity    Assist        Assist Level: Supervision/Verbal cueing   Wheelchair 150 feet activity     Assist Wheelchair 150 feet activity did not occur: Safety/medical concerns(decreased strength/endurance)        Medical Problem List and Plan: 1.Anoxic encephalopath and debilitysecondary tocardiac arrest from acute on chronic respiratory failure.  Continue CIR  Notes reviewed-debility after cardiac arrest, labs reviewed  Team conference today to discuss current and goals and coordination of care, home and environmental barriers, and discharge planning with nursing, case manager, and therapies.  2. Antithrombotics: -DVT/anticoagulation:Pharmaceutical:Heparin -antiplatelet therapy: N/A 3. Pain Management:Tylenol prn  -anxiety mgt   -heat and ice to sternum, reassured her it will improve  4. Mood: team providing ego support -antipsychotic agents:  low dose seroquel at night to help with restlessness and sleep  -lexapro and xanax for anxiety 5. Neuropsych: This patientis ?not fully of making decisions on hisown behalf. 6. Skin/Wound Care:routine pressure relief measures. 7. Fluids/Electrolytes/Nutrition:Strict I/O. Renal diet with 1200 cc FR.    -ongoing dietary ed 8. COPD with acute on chronic respiratory failure:   To need supplemental oxygen -wean as tolerated  -used at HS only at home  -Encourage pulmonary hygiene/flutter valve use. Continue  duonebs tid.   -prednisone 30mg  qd x 2 days, decreased to 20 mg x 2 days on 5/14  9. OSA:Now requires BIPAPwith oxygen at nights.  10 Spinal stenosis/chronic pain:Used tylenol prn at home. 11. Acute renal failure/HD dependent: HD to continue  Creatinine 2.38 on 5/13  Recs per nephro  12. HTN: Controlled on Norvasc. Monitor BID.  Controlled on 5/13 13. Anemia of chronic disease:Stable overall. Continue to monitor  Hemoglobin  10.0 on 5/13  Continue to monitor 14.  Steroid-induced hyperglycemia  Continue monitor with steroid taper    LOS: 4 days A FACE TO FACE EVALUATION WAS PERFORMED   Lorie Phenix 05/31/2018, 12:28 PM

## 2018-05-31 NOTE — Progress Notes (Signed)
[facility]    NEPHROLOGY PROGRESS NOTE  SUBJECTIVE: UOP up to 1.4L yesterday.  No c/o this morning.  Feels overall well on her birthday.  Therapy this AM was good.    OBJECTIVE:  Vitals:   05/31/18 0418 05/31/18 0753  BP: 134/73   Pulse: 89   Resp: 20   Temp: 98.7 F (37.1 C)   SpO2: 96% 91%    Intake/Output Summary (Last 24 hours) at 05/31/2018 1128 Last data filed at 05/31/2018 0700 Gross per 24 hour  Intake 360 ml  Output 1100 ml  Net -740 ml      General:  AAOx3 NAD lying in bed HEENT: MMM [place] AT anicteric sclera Neck:  No JVD CV:  Heart RRR  Lungs:  Normal WOB Abd:  abd SNT/ND with normal BS GU:  No foley Extremities: No bilateral lower extremity edema. Skin:  No skin rash  MEDICATIONS:  . ALPRAZolam  0.25 mg Oral TID  . amLODipine  10 mg Oral Daily  . Chlorhexidine Gluconate Cloth  6 each Topical Q0600  . escitalopram  10 mg Oral Daily  . feeding supplement (NEPRO CARB STEADY)  237 mL Oral TID BM  . heparin  5,000 Units Subcutaneous Q8H  . ipratropium-albuterol  3 mL Nebulization TID  . pantoprazole  40 mg Oral Daily  . predniSONE  30 mg Oral Q breakfast  . QUEtiapine  25 mg Oral QHS       LABS:   CBC Latest Ref Rng & Units 05/31/2018 05/29/2018 05/27/2018  WBC 4.0 - 10.5 K/uL 8.9 8.6 10.1  Hemoglobin 12.0 - 15.0 g/dL 10.0(L) 10.5(L) 10.6(L)  Hematocrit 36.0 - 46.0 % 32.3(L) 33.3(L) 33.6(L)  Platelets 150 - 400 K/uL 216 241 202    CMP Latest Ref Rng & Units 05/31/2018 05/29/2018 05/27/2018  Glucose 70 - 99 mg/dL 91 131(H) 96  BUN 6 - 20 mg/dL 36(H) 11 20  Creatinine 0.44 - 1.00 mg/dL 2.38(H) 1.41(H) 2.86(H)  Sodium 135 - 145 mmol/L 141 136 135  Potassium 3.5 - 5.1 mmol/L 3.8 4.4 3.7  Chloride 98 - 111 mmol/L 102 95(L) 93(L)  CO2 22 - 32 mmol/L 27 28 25   Calcium 8.9 - 10.3 mg/dL 9.4 8.4(L) 8.8(L)  Total Protein 6.5 - 8.1 g/dL - - -  Total Bilirubin 0.3 - 1.2 mg/dL - - -  Alkaline Phos 38 - 126 U/L - - -  AST 15 - 41 U/L - - -  ALT  0 - 44 U/L - - -    Lab Results  Component Value Date   CALCIUM 9.4 05/31/2018   CAION 1.16 05/19/2018   PHOS 1.4 (L) 05/29/2018       Component Value Date/Time   COLORURINE YELLOW 05/21/2018 0901   APPEARANCEUR HAZY (A) 05/21/2018 0901   LABSPEC 1.009 05/21/2018 0901   PHURINE 6.0 05/21/2018 0901   GLUCOSEU NEGATIVE 05/21/2018 0901   HGBUR SMALL (A) 05/21/2018 0901   BILIRUBINUR NEGATIVE 05/21/2018 0901   KETONESUR NEGATIVE 05/21/2018 0901   PROTEINUR 100 (A) 05/21/2018 0901   NITRITE NEGATIVE 05/21/2018 0901   LEUKOCYTESUR NEGATIVE 05/21/2018 0901      Component Value Date/Time   PHART 7.340 (L) 05/21/2018 0305   PCO2ART 53.0 (H) 05/21/2018 0305   PO2ART 100 05/21/2018 0305   HCO3 27.9 05/21/2018 0305   TCO2 27 05/19/2018 0454   ACIDBASEDEF 1.1 05/20/2018 1240   O2SAT 96.4 05/21/2018 0305       Component Value Date/Time   FERRITIN 6,433 (  H) 05/12/2018 2135       ASSESSMENT/PLAN:    48 year old female patient status post cardiac arrest of unknown duration, vent dependent respiratory failure, and anuric acute kidney injury, now dialysis dependent.  Baseline creatinine 0.7 from January 17, 2018 and 1.4 on arrival to the hospital on 05/12/2018.  1.  Acute kidney injury likely secondary to ischemic ATN.  Kidney ultrasound on 4/27 with no obstruction.  Sub-nephrotic proteinuria noted.  Started dialysis 4/28. [place] placed 05/26/18.  She has increased UOP with 875mL 5/11 and 1.4L 5/12.  Will hold on HD today and continue to monitor as it seems she may finally be having renal recovery but too early to say for sure.   Ultimately, has an outpatient seat in [place] on Tuesday, Thursday, and Saturday.  Will transition to this prior to discharge if she continues to need dialysis.  2.  Cardiac arrest.  Presumed due to a respiratory event.  3.  Acute on chronic respiratory failure with hypoxia.  Oxygenating well on nasal cannula.  4.  Aspiration pneumonia and COPD exacerbation.   Status post antibiotics.  On pred taper currently.  5.  Anemia.  Hemoglobin is around 9.8-10.  Continue to monitor.  Iron stores are appropriate.  6.  Hyperphosphatemia.  Resolved, stopped binder.   7.  Hypertension.  Continue amlodipine. BP 134/73.  Jannifer Hick MD [facility] Pager [phone]

## 2018-05-31 NOTE — Progress Notes (Signed)
Occupational Therapy Session Note  Patient Details  Name: Misty Green MRN: [id number] Date of Birth: [birth year]  Today's Date: 05/31/2018 OT Individual Time: 0705-0800 OT Individual Time Calculation (min): 55 min    Short Term Goals: Week 1:  OT Short Term Goal 1 (Week 1): STG=LTG d/t ELOS  Skilled Therapeutic Interventions/Progress Updates:    Upon entering the room, pt supine in bed with 8/10 rib pain reported. Pt also reports not sleeping well but agreeable to attempt OT intervention. Pt declined bathing and dressing this session. Pt continues to be upset and tearful over matted hair. OT declined to continue working on this issue since pt is also reporting increase sensitivity and pain from it being worked on yesterday with no results. Pt ambulating with RW to sink for grooming tasks at supervision level. Pt continued ambulation 100' with RW to day room with steady assistance. Oxygen removed to attempt to wean off with pt decreasing to 80% on room air. Pt seated at table and eating breakfast and take rest break. O2 donned via [place] 1 L and O2 saturation increasing to 94%. Pt returning back to room at end of session and requesting to return to bed secondary to pain. RN notified. Bed alarm activated and call bell within reach upon exiting the room.  Therapy Documentation Precautions:  Precautions Precautions: Fall Precaution Comments: on 3L 02 at night at baseline, ostomy Restrictions Weight Bearing Restrictions: No    Pain: Pain Assessment Pain Scale: 0-10 Pain Score: 0-No pain    Therapy/Group: Individual Therapy  Gypsy Decant 05/31/2018, 12:15 PM

## 2018-05-31 NOTE — Patient Care Conference (Addendum)
Inpatient RehabilitationTeam Conference and Plan of Care Update Date: 05/31/2018   Time: 2:50  PM    Patient Name: Misty Green      Medical Record Number: [id number]  Date of Birth: 12-14-1970 Sex: Female         Room/Bed: 5N19C/5N19C-01 Payor Info: Payor: Theme park manager / Plan: Theme park manager OTHER / Product Type: *No Product type* /    Admitting Diagnosis: NT Team  NTBI, enceph; 13-14days  Admit Date/Time:  05/27/2018  2:47 PM Admission Comments: No comment available   Primary Diagnosis:  <principal problem not specified> Principal Problem: <principal problem not specified>  Patient Active Problem List   Diagnosis Date Noted  . OSA (obstructive sleep apnea)   . Noncompliance with CPAP treatment   . Chronic obstructive pulmonary disease ([place])   . Chest wall pain   . Labile blood pressure   . Essential hypertension   . Steroid-induced hyperglycemia   . Anemia of chronic disease   . Dependent on hemodialysis ([place])   . AKI (acute kidney injury) ([facility])   . Supplemental oxygen dependent   . Generalized anxiety disorder   . Hypoxic encephalopathy ([place]) 05/27/2018  . Anoxic brain injury ([place])   . ARF (acute renal failure) ([place])   . Lactic acidosis   . Transaminitis   . Acute on chronic respiratory failure with hypercapnia ([place])   . Cardiac arrest ([place]) 05/12/2018  . Diverticulitis of colon (without mention of hemorrhage)(562.11)   . Acute diverticulitis 01/06/2018  . Mixed anxiety and depressive disorder 12/22/2017  . Obesity 12/22/2017  . Tobacco user 12/22/2017  . Diverticulitis 12/03/2017  . Spinal stenosis of lumbar region 04/12/2016  . Acute respiratory failure ([place]) 03/22/2016  . COPD exacerbation ([place]) 03/22/2016  . Chronic back pain 03/22/2016  . Opioid type dependence, abuse ([place]) 03/22/2016  . Polycythemia 03/22/2016  . Anxiety 03/22/2016  . OSA on CPAP 03/22/2016  . Closed fracture of distal end of right radius 08/25/2015  . Closed nondisplaced  fracture of styloid process of right ulna 08/25/2015    Expected Discharge Date: Expected Discharge Date: 06/05/18  Team Members Present: Physician leading conference: Dr. Delice Lesch Social Worker Present: Lennart Pall, LCSW Nurse Present: Leonette Nutting, RN PT Present: Burnard Bunting, PT OT Present: Mariane Masters, OT SLP Present: Weston Anna, SLP PPS Coordinator present : Gunnar Fusi     Current Status/Progress Goal Weekly Team Focus  Medical   Anoxic encephalopath and debility secondary to cardiac arrest from acute on chronic respiratory failure  Improve mobility, HTN, AKI, COPD, endurance  See above   Bowel/Bladder   Pt has colostomy and HD cath. LBM 05/31/2018.  Maintain regular bowel pattern.  Assist with toileting needs PRN.   Swallow/Nutrition/ Hydration   Regular textures with thin liquids, Mod I  Mod I  tolerance of current diet    ADL's   CGA overall with low endurance  S overall  ADL training, functional mobility, balance, endurance, pt education   Mobility   S bed mobility, CGA transfers and gait with RW & stairs  mod I transfers & bed mobility & household gait with AD, S community, CGA stairs  endurance, balance, gait, stairs   Communication             Safety/Cognition/ Behavioral Observations  Mod A  Min A   recall, attention, awareness and problem solving    Pain   No complaints of pain.  Remain pain free.  Assess pain Q shift and PRN.  Skin   No skin issues.   Maintain skin integrity and prevent skin breakdown.  Assess skin Q shift and PRN.     Rehab Goals Patient on target to meet rehab goals: Yes *See Care Plan and progress notes for long and short-term goals.     Barriers to Discharge  Current Status/Progress Possible Resolutions Date Resolved   Physician    Medical stability;New oxygen;Hemodialysis     See above  Therapies, optimize BP meds, HD per Nephro, wean supplemental O2 as tolerated      Nursing                  PT                     OT                  SLP                SW                Discharge Planning/Teaching Needs:  Plan to return home with spouse who can provide 24/7 assistance x 2 weeks and sister to assist following that.  Teaching needs TBD   Team Discussion:  MD monitoring kidney fxn - currently received HD due to AKI.  On steroid taper and hope to wean O2 as well.  CGA overall with therapies and mod ind goals.  Concern with 12 steps in the home.  Revisions to Treatment Plan:  NA    Continued Need for Acute Rehabilitation Level of Care: The patient requires daily medical management by a physician with specialized training in physical medicine and rehabilitation for the following conditions: Daily direction of a multidisciplinary physical rehabilitation program to ensure safe treatment while eliciting the highest outcome that is of practical value to the patient.: Yes Daily medical management of patient stability for increased activity during participation in an intensive rehabilitation regime.: Yes Daily analysis of laboratory values and/or radiology reports with any subsequent need for medication adjustment of medical intervention for : Neurological problems;Cardiac problems;Pulmonary problems;Renal problems;Blood pressure problems   I attest that I was present, lead the team conference, and concur with the assessment and plan of the team.   Donata Clay, Halaina Vanduzer 06/02/2018, 3:26 PM   Team conference was held via web/ teleconference due to [place] - 19

## 2018-05-31 NOTE — Progress Notes (Signed)
Pt not using home CPAP at this time, states mask doesn't feel right. Pt states she is fine using O2 at night. RT will continue to monitor.

## 2018-05-31 NOTE — Progress Notes (Signed)
During the Covid 19 public health emergency, the inpatient rehabilitation unit of the [facility] will use acute care beds on another unit to provide each patient with a private room. This effort is to assure proper infection control and to optimize care management during this public health emergency.     

## 2018-05-31 NOTE — Progress Notes (Signed)
Physical Therapy Session Note  Patient Details  Name: Misty Green MRN: [id number] Date of Birth: 10/24/1970  Today's Date: 05/31/2018 PT Individual Time: 1315-1400 PT Individual Time Calculation (min): 45 min   Short Term Goals: Week 1:  PT Short Term Goal 1 (Week 1): STG=LTG due to short ELOS.  Skilled Therapeutic Interventions/Progress Updates:    Patient in room eating initially; missed 15 minutes of skilled PT.  Patient supine to sit with S and sit to stand S, ambulated to bathroom with S/CGA and RW.  Standing unsupported to wash hands at sink with S.  Patient ambulated x 150' with RW and CGA on 2L O2 SpO2 84% seated rest to increase to 90% or more x 1 minute.  Patient ambulated another 57' and negotiated 5 steps with two rails and close S to CGA.  Patient educated about body telling her she needs to rest prior to resting and need to sit to rest sooner to avoid desaturation.  Patient completed Berg balance assessment as noted below scoring 44/56 and discussed fall risk and need for RW now for fall prevention.  Patient ambulated to room with RW and CGA 150'.  Standing activities at counter including heel raises and hip abduction with UE support, and mini squats.  Patient ambulated to bed no device CGA reaching for bed rails.  Climbed into bed with S and left with call bell and needs in reach and bed alarm activated.    Therapy Documentation Precautions:  Precautions Precautions: Fall Precaution Comments: on 3L 02 at night at baseline, ostomy Restrictions Weight Bearing Restrictions: No General: PT Amount of Missed Time (min): 15 Minutes PT Missed Treatment Reason: Other (Comment)(eating lunch) Pain: Pain Assessment Pain Score: 3   Balance: Standardized Balance Assessment Standardized Balance Assessment: Berg Balance Test Berg Balance Test Sit to Stand: Able to stand without using hands and stabilize independently Standing Unsupported: Able to stand safely 2 minutes Sitting  with Back Unsupported but Feet Supported on Floor or Stool: Able to sit safely and securely 2 minutes Stand to Sit: Sits safely with minimal use of hands Transfers: Able to transfer safely, minor use of hands Standing Unsupported with Eyes Closed: Able to stand 10 seconds with supervision Standing Ubsupported with Feet Together: Able to place feet together independently and stand 1 minute safely From Standing, Reach Forward with Outstretched Arm: Can reach forward >12 cm safely (5") From Standing Position, Pick up Object from Floor: Able to pick up shoe, needs supervision From Standing Position, Turn to Look Behind Over each Shoulder: Looks behind from both sides and weight shifts well Turn 360 Degrees: Able to turn 360 degrees safely but slowly Standing Unsupported, Alternately Place Feet on Step/Stool: Able to complete >2 steps/needs minimal assist Standing Unsupported, One Foot in Front: Able to plae foot ahead of the other independently and hold 30 seconds Standing on One Leg: Tries to lift leg/unable to hold 3 seconds but remains standing independently Total Score: 44   Therapy/Group: Individual Therapy  Reginia Naas  Magda Kiel, PT 05/31/2018, 4:33 PM

## 2018-06-01 ENCOUNTER — Inpatient Hospital Stay (HOSPITAL_COMMUNITY): Payer: 59

## 2018-06-01 ENCOUNTER — Inpatient Hospital Stay (HOSPITAL_COMMUNITY): Payer: 59 | Admitting: Occupational Therapy

## 2018-06-01 ENCOUNTER — Inpatient Hospital Stay (HOSPITAL_COMMUNITY): Payer: 59 | Admitting: Speech Pathology

## 2018-06-01 DIAGNOSIS — R0789 Other chest pain: Secondary | ICD-10-CM

## 2018-06-01 DIAGNOSIS — R0989 Other specified symptoms and signs involving the circulatory and respiratory systems: Secondary | ICD-10-CM

## 2018-06-01 DIAGNOSIS — I10 Essential (primary) hypertension: Secondary | ICD-10-CM

## 2018-06-01 LAB — RENAL FUNCTION PANEL
Albumin: 3.2 g/dL — ABNORMAL LOW (ref 3.5–5.0)
Anion gap: 12 (ref 5–15)
BUN: 41 mg/dL — ABNORMAL HIGH (ref 6–20)
CO2: 29 mmol/L (ref 22–32)
Calcium: 9.3 mg/dL (ref 8.9–10.3)
Chloride: 104 mmol/L (ref 98–111)
Creatinine, Ser: 2.23 mg/dL — ABNORMAL HIGH (ref 0.44–1.00)
GFR calc Af Amer: 29 mL/min — ABNORMAL LOW (ref >60)
GFR calc non Af Amer: 25 mL/min — ABNORMAL LOW (ref >60)
Glucose, Bld: 91 mg/dL (ref 70–99)
Phosphorus: 4.2 mg/dL (ref 2.5–4.6)
Potassium: 3.6 mmol/L (ref 3.5–5.1)
Sodium: 145 mmol/L (ref 135–145)

## 2018-06-01 MED ORDER — METHOCARBAMOL 500 MG PO TABS
500.0000 mg | ORAL_TABLET | Freq: Three times a day (TID) | ORAL | Status: DC | PRN
  Administered 2018-06-01 (×2): 500 mg via ORAL
  Filled 2018-06-01 (×2): qty 1

## 2018-06-01 MED ORDER — TRAMADOL HCL 50 MG PO TABS
25.0000 mg | ORAL_TABLET | Freq: Four times a day (QID) | ORAL | Status: DC | PRN
  Administered 2018-06-01 – 2018-06-03 (×6): 25 mg via ORAL
  Filled 2018-06-01 (×6): qty 1

## 2018-06-01 NOTE — Plan of Care (Signed)
  Problem: RH BOWEL ELIMINATION Goal: RH STG MANAGE BOWEL WITH ASSISTANCE Description STG Manage Bowel with Assistance. Mod I  Outcome: Progressing   Problem: RH BLADDER ELIMINATION Goal: RH STG MANAGE BLADDER WITH ASSISTANCE Description STG Manage Bladder With Assistance. Mod I  Outcome: Progressing   Problem: RH SKIN INTEGRITY Goal: RH STG ABLE TO PERFORM INCISION/WOUND CARE W/ASSISTANCE Description STG Able To Perform Incision/Wound Care With Assistance. Mod I  Outcome: Progressing   Problem: RH SAFETY Goal: RH STG ADHERE TO SAFETY PRECAUTIONS W/ASSISTANCE/DEVICE Description STG Adhere to Safety Precautions With Assistance/Device. Mod I  Outcome: Progressing   Problem: RH KNOWLEDGE DEFICIT GENERAL Goal: RH STG INCREASE KNOWLEDGE OF SELF CARE AFTER HOSPITALIZATION Description Patient will be able to verbalize self care at home with cues/handouts  Outcome: Progressing

## 2018-06-01 NOTE — Progress Notes (Signed)
Pt states she does not want to wear CPAP tonight. She will just wear her oxygen.

## 2018-06-01 NOTE — Progress Notes (Signed)
[facility]    NEPHROLOGY PROGRESS NOTE  SUBJECTIVE: UOP 1L yesterday.  No c/o this morning. Excited no more dialysis needed.  Per report plan D/C home Monday.     OBJECTIVE:  Vitals:   06/01/18 0603 06/01/18 0853  BP: 121/78   Pulse: 88   Resp: 18   Temp: 98.3 F (36.8 C)   SpO2: 95% 95%    Intake/Output Summary (Last 24 hours) at 06/01/2018 1113 Last data filed at 06/01/2018 1056 Gross per 24 hour  Intake 480 ml  Output 2100 ml  Net -1620 ml      General:  AAOx3 NAD lying in bed HEENT: MMM [place] AT anicteric sclera Neck:  No JVD CV:  Heart RRR  Lungs:  Normal WOB Abd:  abd SNT/ND with normal BS GU:  No foley Extremities: No bilateral lower extremity edema. Skin:  No skin rash  MEDICATIONS:  . ALPRAZolam  0.25 mg Oral TID  . amLODipine  10 mg Oral Daily  . Chlorhexidine Gluconate Cloth  6 each Topical Q0600  . escitalopram  10 mg Oral Daily  . feeding supplement (NEPRO CARB STEADY)  237 mL Oral TID BM  . heparin  5,000 Units Subcutaneous Q8H  . ipratropium-albuterol  3 mL Nebulization TID  . pantoprazole  40 mg Oral Daily  . predniSONE  20 mg Oral Q breakfast  . QUEtiapine  25 mg Oral QHS       LABS:   CBC Latest Ref Rng & Units 05/31/2018 05/29/2018 05/27/2018  WBC 4.0 - 10.5 K/uL 8.9 8.6 10.1  Hemoglobin 12.0 - 15.0 g/dL 10.0(L) 10.5(L) 10.6(L)  Hematocrit 36.0 - 46.0 % 32.3(L) 33.3(L) 33.6(L)  Platelets 150 - 400 K/uL 216 241 202    CMP Latest Ref Rng & Units 06/01/2018 05/31/2018 05/29/2018  Glucose 70 - 99 mg/dL 91 91 131(H)  BUN 6 - 20 mg/dL 41(H) 36(H) 11  Creatinine 0.44 - 1.00 mg/dL 2.23(H) 2.38(H) 1.41(H)  Sodium 135 - 145 mmol/L 145 141 136  Potassium 3.5 - 5.1 mmol/L 3.6 3.8 4.4  Chloride 98 - 111 mmol/L 104 102 95(L)  CO2 22 - 32 mmol/L 29 27 28   Calcium 8.9 - 10.3 mg/dL 9.3 9.4 8.4(L)  Total Protein 6.5 - 8.1 g/dL - - -  Total Bilirubin 0.3 - 1.2 mg/dL - - -  Alkaline Phos 38 - 126 U/L - - -  AST 15 - 41 U/L - - -  ALT 0 -  44 U/L - - -    Lab Results  Component Value Date   CALCIUM 9.3 06/01/2018   CAION 1.16 05/19/2018   PHOS 4.2 06/01/2018       Component Value Date/Time   COLORURINE YELLOW 05/21/2018 0901   APPEARANCEUR HAZY (A) 05/21/2018 0901   LABSPEC 1.009 05/21/2018 0901   PHURINE 6.0 05/21/2018 0901   GLUCOSEU NEGATIVE 05/21/2018 0901   HGBUR SMALL (A) 05/21/2018 0901   BILIRUBINUR NEGATIVE 05/21/2018 0901   KETONESUR NEGATIVE 05/21/2018 0901   PROTEINUR 100 (A) 05/21/2018 0901   NITRITE NEGATIVE 05/21/2018 0901   LEUKOCYTESUR NEGATIVE 05/21/2018 0901      Component Value Date/Time   PHART 7.340 (L) 05/21/2018 0305   PCO2ART 53.0 (H) 05/21/2018 0305   PO2ART 100 05/21/2018 0305   HCO3 27.9 05/21/2018 0305   TCO2 27 05/19/2018 0454   ACIDBASEDEF 1.1 05/20/2018 1240   O2SAT 96.4 05/21/2018 0305       Component Value Date/Time   FERRITIN 6,898 (H) 05/12/2018 2135  ASSESSMENT/PLAN:    48 year old female patient status post cardiac arrest of unknown duration, vent dependent respiratory failure, and anuric acute kidney injury, now dialysis dependent.  Baseline creatinine 0.7 from January 17, 2018 and 1.4 on arrival to the hospital on 05/12/2018.  1.  Acute kidney injury likely secondary to ischemic ATN.  Kidney ultrasound on 4/27 with no obstruction.  Sub-nephrotic proteinuria noted.  Started dialysis 4/28. [facility] placed 05/26/18.  Last HD was 5/11 and since then UOP improved and labs show improved renal function.  Remove catheter, cancelled outpatient HD chair.   2.  Cardiac arrest.  Presumed due to a respiratory event.  3.  Acute on chronic respiratory failure with hypoxia.  Oxygenating well on nasal cannula.  4.  Aspiration pneumonia and COPD exacerbation.  Status post antibiotics.  On pred taper currently.  5.  Anemia.  Hemoglobin is around 9.8-10.  Continue to monitor.  Iron stores are appropriate.  6.  Hyperphosphatemia.  Resolved, stopped binder.   7.  Hypertension.   Continue amlodipine. BP 121/78.  [place] for discharge from my perspective.  Will arrange f/u with myself in clinic in 4-6 weeks; [facility] [place] office will reach out to her with specifics.  Will sign off for now.  Please page if issues arise.   Jannifer Hick MD [facility] Pager [phone]

## 2018-06-01 NOTE — Progress Notes (Signed)
Renal Navigator received call from Dr. Arlyss Gandy to cancel OP HD seat. Renal Navigator notified [facility] that OP HD seat is no longer needed.  Misty Green Renal Navigator [phone]

## 2018-06-01 NOTE — Progress Notes (Signed)
Occupational Therapy Session Note  Patient Details  Name: Misty Green MRN: [id number] Date of Birth: [birth year]  Today's Date: 06/01/2018 OT Individual Time: 0746-0029 OT Individual Time Calculation (min): 60 min    Short Term Goals: Week 1:  OT Short Term Goal 1 (Week 1): STG=LTG d/t ELOS  Skilled Therapeutic Interventions/Progress Updates:    Pt received in bed and agreeable to therapy.  She is on 1L of O2.  Trialed a short time period on room air ,but O2 sats decreased to 85%.  Pt was able to ambulate to bathroom to toilet and then sat at sink for grooming and bathing with sit to stand with S.  She then ambulated to other sink to try hairwashing tray with detangling solution, shampoo, conditioner and combing but minimal progress made on her hair mat.  Pt stated numerous times that she was still in so much pain from the CPR efforts.  RN made aware to bring her medication.  Pt resting in bed with alarm set and all needs met.   Therapy Documentation Precautions:  Precautions Precautions: Fall Precaution Comments: on 3L 02 at night at baseline, ostomy Restrictions Weight Bearing Restrictions: No    Vital Signs: Oxygen Therapy SpO2: 95 % O2 Device: Nasal Cannula O2 Flow Rate (L/min): 1 L/min Pain: Pain Assessment Pain Scale: 0-10 Pain Score: 9  Pain Intervention(s): Medication (See eMAR);Distraction;Emotional support;Relaxation;Rest      Therapy/Group: Individual Therapy  [place] 06/01/2018, 12:47 PM

## 2018-06-01 NOTE — Progress Notes (Signed)
[place] PHYSICAL MEDICINE & REHABILITATION PROGRESS NOTE   Subjective/Complaints: Patient seen laying in bed this morning.  She states she slept fairly well overnight.  She slept well per sleep chart.  She notes persistent musculoskeletal chest pain.  She notes that she was unable to wean supplemental oxygen yesterday.  She was seen by nephrology yesterday, notes reviewed, HD on hold.  ROS: Denies CP, SOB, N/V/D  Objective:   No results found. Recent Labs    05/29/18 1516 05/31/18 0522  WBC 8.6 8.9  HGB 10.5* 10.0*  HCT 33.3* 32.3*  PLT 241 216   Recent Labs    05/31/18 0522 06/01/18 0623  NA 141 145  K 3.8 3.6  CL 102 104  CO2 27 29  GLUCOSE 91 91  BUN 36* 41*  CREATININE 2.38* 2.23*  CALCIUM 9.4 9.3    Intake/Output Summary (Last 24 hours) at 06/01/2018 1147 Last data filed at 06/01/2018 1056 Gross per 24 hour  Intake 480 ml  Output 2100 ml  Net -1620 ml     Physical Exam: Vital Signs Blood pressure 121/78, pulse 88, temperature 98.3 F (36.8 C), temperature source Oral, resp. rate 18, height 5\' 1"  (1.549 m), weight 82 kg, SpO2 95 %. Constitutional: No distress . Vital signs reviewed. HENT: Normocephalic.  Atraumatic. Eyes: EOMI. No discharge. Cardiovascular: No JVD. Respiratory: Normal effort.  + [place]. GI: Non-distended. Musc: MSK sternal pain, persistent Neurological: She isalert and oriented. Motor: 4+/5 throughout grossly, unchanged Skin: Warm and dry.  Intact. Psychiatric:Normal mood.  Normal affect.   Assessment/Plan: 1. Functional deficits secondary to anoxic encephalopathy, debility which require 3+ hours per day of interdisciplinary therapy in a comprehensive inpatient rehab setting.  Physiatrist is providing close team supervision and 24 hour management of active medical problems listed below.  Physiatrist and rehab team continue to assess barriers to discharge/monitor patient progress toward functional and medical goals  Care  Tool:  Bathing  Bathing activity did not occur: Safety/medical concerns Body parts bathed by patient: Right arm, Left arm, Chest, Abdomen, Front perineal area, Buttocks, Right upper leg, Left upper leg, Right lower leg, Left lower leg, Face         Bathing assist Assist Level: Contact Guard/Touching assist     Upper Body Dressing/Undressing Upper body dressing   What is the patient wearing?: Pull over shirt    Upper body assist Assist Level: Set up assist    Lower Body Dressing/Undressing Lower body dressing      What is the patient wearing?: Pants, Underwear/pull up     Lower body assist Assist for lower body dressing: Contact Guard/Touching assist     Toileting Toileting    Toileting assist Assist for toileting: Supervision/Verbal cueing     Transfers Chair/bed transfer  Transfers assist     Chair/bed transfer assist level: Contact Guard/Touching assist     Locomotion Ambulation   Ambulation assist      Assist level: Contact Guard/Touching assist Assistive device: Walker-rolling Max distance: 150'   Walk 10 feet activity   Assist     Assist level: Contact Guard/Touching assist Assistive device: Walker-rolling   Walk 50 feet activity   Assist    Assist level: Contact Guard/Touching assist Assistive device: Walker-rolling    Walk 150 feet activity   Assist Walk 150 feet activity did not occur: Safety/medical concerns(decreased strength/endurance)  Assist level: Contact Guard/Touching assist Assistive device: Walker-rolling    Walk 10 feet on uneven surface  activity   Assist  Assist level: Contact Guard/Touching assist Assistive device: Aeronautical engineer Will patient use wheelchair at discharge?: Yes Type of Wheelchair: Manual    Wheelchair assist level: Supervision/Verbal cueing Max wheelchair distance: 50'    Wheelchair 50 feet with 2 turns activity    Assist        Assist  Level: Supervision/Verbal cueing   Wheelchair 150 feet activity     Assist Wheelchair 150 feet activity did not occur: Safety/medical concerns(decreased strength/endurance)        Medical Problem List and Plan: 1.Anoxic encephalopath and debilitysecondary tocardiac arrest from acute on chronic respiratory failure.  Continue CIR 2. Antithrombotics: -DVT/anticoagulation:Pharmaceutical:Heparin -antiplatelet therapy: N/A 3. Pain Management:Tylenol prnand tramadol  As needed Robaxin added on 5/14  -anxiety mgt   -heat and ice to sternum, reassured her it will improve  4. Mood: team providing ego support -antipsychotic agents:  low dose seroquel at night to help with restlessness and sleep  -lexapro and xanax for anxiety 5. Neuropsych: This patientis ?not fully of making decisions on hisown behalf. 6. Skin/Wound Care:routine pressure relief measures. 7. Fluids/Electrolytes/Nutrition:Strict I/O. Renal diet with 1200 cc FR.    -ongoing dietary ed 8. COPD with acute on chronic respiratory failure:   To need supplemental oxygen -wean as tolerated  -used at HS only at home  -Encourage pulmonary hygiene/flutter valve use. Continue  duonebs tid.   -prednisone 30mg  qd x 2 days, decreased to 20 mg x 2 days on 5/14  9. OSA:Now requires BIPAPwith oxygen at nights.  10 Spinal stenosis/chronic pain:Used tylenol prn at home. 11. Acute renal failure/HD dependent: HD to continue  Creatinine 2.23 on 5/14  Recs per nephro  12. HTN: Norvasc. Monitor BID.  Labile on 5/14 13. Anemia of chronic disease:Stable overall. Continue to monitor  Hemoglobin 10.0 on 5/13  Continue to monitor 14.  Steroid-induced hyperglycemia  Continue monitor with steroid taper  ?  Improving on 5/14    LOS: 5 days A FACE TO FACE EVALUATION WAS PERFORMED  Ankit Lorie [place] 06/01/2018, 11:47 AM

## 2018-06-01 NOTE — Progress Notes (Signed)
Physical Therapy Session Note  Patient Details  Name: Misty Green MRN: [id number] Date of Birth: 26-Apr-1970  Today's Date: 06/01/2018 PT Individual Time: 5110-2111 PT Individual Time Calculation (min): 30 min   Short Term Goals: Week 1:  PT Short Term Goal 1 (Week 1): STG=LTG due to short ELOS.  Skilled Therapeutic Interventions/Progress Updates:    Patient seated EOB holding hot pack to her chest c/o continued pain and refusing ambulation at this time.  Patient performed sit<>stand x 5 from EOB with S.  Increased SOB and therefore chest pain.  Educated in splinting and made splinting "pillow" out of blanket for her.  Patient performed seated therex including LAQ's, hip flexion (w/orange theraband), hip abduction (w/orange theraband), backward shoulder rolls, sidelying hip abduction (clamshell), and arm horizontal abduction (opening book), supine bridge all x 5-10 reps.  Patient left sitting up in bed with HOB elevated for support.  Too painful for ambulation or other activity at this time so missed 30 minutes of PT.  RN aware of pain and need to call MD for ordering different medications.    Therapy Documentation Precautions:  Precautions Precautions: Fall Precaution Comments: on 3L 02 at night at baseline, ostomy Restrictions Weight Bearing Restrictions: No General:  Missed 30 minutes of skilled PT due to pain Pain: Pain Assessment Pain Scale: 0-10 Pain Score: 8  Faces Pain Scale: No hurt Pain Type: Acute pain Pain Location: Chest Pain Orientation: Mid Pain Descriptors / Indicators: Aching;Sore;Discomfort Pain Frequency: Constant Pain Onset: On-going Pain Intervention(s): Repositioned;Splinting    Therapy/Group: Individual Therapy  Reginia Naas  Magda Kiel, PT 06/01/2018, 8:33 AM
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
## 2018-06-01 NOTE — Progress Notes (Signed)
Occupational Therapy Note  Patient Details  Name: Misty Green MRN: [id number] Date of Birth: Jul 05, 1970  Today's Date: 06/01/2018 OT Missed Time: 12 Minutes Missed Time Reason: Pain  Attempted twice to see patient with patient refusing each attempt stating that her rib pain was 9/10. She had already requested pain medication/muscle relaxer's. Hot pack was provided for pain management.  Patient missed 30 minutes of treatment.   Ailene Ravel, OTR/L,CBIS  [phone]  06/01/2018, 2:39 PM

## 2018-06-01 NOTE — Progress Notes (Signed)
Speech Language Pathology Daily Session Note  Patient Details  Name: Misty Green MRN: [id number] Date of Birth: 1970/09/28  Today's Date: 06/01/2018 SLP Individual Time: 1400-1450 SLP Individual Time Calculation (min): 50 min  Short Term Goals: Week 1: SLP Short Term Goal 1 (Week 1): STG=LTG due to short ELOS  Skilled Therapeutic Interventions:     Skilled treatment session focused on dysphagia and cognition goals. SLP received pt sitting upright in bed c/o ongoing chest pain related to CPR. SLP facilitated session by providing skilled observation of pt consuming tacos and thin liquids via straw provided by pt's husband. Pt consumed without overt s/s of aspiration or dysphagia. SLP also facilitated session by aiding pt in researching information related to new muscle relaxer that MD had prescribed. Session ended 10 minutes early d/t RT providing breathing treatment. Pt handed off to RT.   Pain Pain Assessment Pain Scale: 0-10 Pain Score: 10-Worst pain ever Pain Type: Acute pain Pain Location: Chest Pain Orientation: Mid Pain Descriptors / Indicators: Aching Pain Onset: On-going Patients Stated Pain Goal: 0 Pain Intervention(s): Medication (See eMAR);Relaxation;Rest;Emotional support Multiple Pain Sites: No  Therapy/Group: Individual Therapy  Lynnelle Mesmer 06/01/2018, 2:52 PM

## 2018-06-02 ENCOUNTER — Inpatient Hospital Stay (HOSPITAL_COMMUNITY): Payer: 59 | Admitting: Speech Pathology

## 2018-06-02 ENCOUNTER — Encounter (HOSPITAL_COMMUNITY): Payer: Self-pay | Admitting: Interventional Radiology

## 2018-06-02 ENCOUNTER — Inpatient Hospital Stay (HOSPITAL_COMMUNITY): Payer: 59 | Admitting: Occupational Therapy

## 2018-06-02 ENCOUNTER — Inpatient Hospital Stay (HOSPITAL_COMMUNITY): Payer: 59

## 2018-06-02 DIAGNOSIS — Z9114 Patient's other noncompliance with medication regimen: Secondary | ICD-10-CM

## 2018-06-02 DIAGNOSIS — G4733 Obstructive sleep apnea (adult) (pediatric): Secondary | ICD-10-CM

## 2018-06-02 DIAGNOSIS — J449 Chronic obstructive pulmonary disease, unspecified: Secondary | ICD-10-CM

## 2018-06-02 HISTORY — PX: IR REMOVAL TUN CV CATH W/O FL: IMG2289

## 2018-06-02 MED ORDER — PREDNISONE 10 MG PO TABS
10.0000 mg | ORAL_TABLET | Freq: Every day | ORAL | Status: DC
  Administered 2018-06-03 – 2018-06-05 (×3): 10 mg via ORAL
  Filled 2018-06-02 (×3): qty 1

## 2018-06-02 MED ORDER — CHLORHEXIDINE GLUCONATE 4 % EX LIQD
CUTANEOUS | Status: AC
  Filled 2018-06-02: qty 15

## 2018-06-02 MED ORDER — POLYETHYLENE GLYCOL 3350 17 G PO PACK
17.0000 g | PACK | Freq: Two times a day (BID) | ORAL | Status: DC
  Administered 2018-06-02 – 2018-06-05 (×7): 17 g via ORAL
  Filled 2018-06-02 (×7): qty 1

## 2018-06-02 MED ORDER — SORBITOL 70 % SOLN
30.0000 mL | Freq: Once | Status: AC
  Administered 2018-06-02: 30 mL via ORAL
  Filled 2018-06-02: qty 30

## 2018-06-02 MED ORDER — METHOCARBAMOL 500 MG PO TABS
1000.0000 mg | ORAL_TABLET | Freq: Three times a day (TID) | ORAL | Status: DC
  Administered 2018-06-02 – 2018-06-05 (×10): 1000 mg via ORAL
  Filled 2018-06-02 (×11): qty 2

## 2018-06-02 NOTE — Progress Notes (Signed)
Social Work Patient ID: Misty Green, female   DOB: Mar 18, 1970, 48 y.o.   MRN: [id number]  Met with pt to review team conference.  She is aware and agreeable with targeted d/c date of 5/18 and supervision to mod independent goals overall.  Very happy that she no longer needs HD.  Feels very comfortable that her spouse can assist if needed.  Continue to follow.  Jonte Wollam, LCSW

## 2018-06-02 NOTE — Progress Notes (Signed)
Physical Therapy Session Note  Patient Details  Name: Misty Green MRN: [id number] Date of Birth: [birth year]  Today's Date: 06/02/2018 PT Individual Time: 0833-0930 PT Individual Time Calculation (min): 57 min   Short Term Goals: Week 1:  PT Short Term Goal 1 (Week 1): STG=LTG due to short ELOS.  Skilled Therapeutic Interventions/Progress Updates:    Patient in supine reports colostomy not right.  [place] RN in hallway and asked for her to look at it.  She reported pt not on her list.  Came and spoke with pt and recommended laxative with RN in room.  Patient performed supine therex including hooklying hip abduction, AP's, bridging with 3 sec hold, SLR all x 10.  Supine to sit S to perform sit<>stand x 5, standing heel raises and mini squats with bilat HHA, side stepping and marching with HHA.  Patient ambulated x 150' with RW and close S to [place].  Performed 10 min on Nu Step @ level 1 UE/LE.  Left seated EOB with call bell in reach and bed alarm activated.   Therapy Documentation Precautions:  Precautions Precautions: Fall Precaution Comments: on 3L 02 at night at baseline, ostomy Restrictions Weight Bearing Restrictions: No  Pain: Pain Assessment Pain Scale: 0-10 Pain Score: 0-No pain Pain Intervention(s): Medication (See eMAR);Distraction;Rest;Emotional support    Therapy/Group: Individual Therapy  Reginia Naas  Magda Kiel, PT 06/02/2018, 9:19 AM

## 2018-06-02 NOTE — Progress Notes (Signed)
[facility] PHYSICAL MEDICINE & REHABILITATION PROGRESS NOTE   Subjective/Complaints: Patient seen sitting up in bed this morning.  She states she slept fairly overnight, confirmed with sleep chart.  She has multiple questions regarding medications, pain medication, COVID, diet, HD, fluid restriction, anxiolytics, etc.  She refused her CPAP overnight.  ROS: Denies CP, SOB, N/V/D  Objective:   No results found. Recent Labs    05/31/18 0522  WBC 8.9  HGB 10.0*  HCT 32.3*  PLT 216   Recent Labs    05/31/18 0522 06/01/18 0623  NA 141 145  K 3.8 3.6  CL 102 104  CO2 27 29  GLUCOSE 91 91  BUN 36* 41*  CREATININE 2.38* 2.23*  CALCIUM 9.4 9.3    Intake/Output Summary (Last 24 hours) at 06/02/2018 1154 Last data filed at 06/02/2018 0400 Gross per 24 hour  Intake 480 ml  Output 2575 ml  Net -2095 ml     Physical Exam: Vital Signs Blood pressure 129/72, pulse 76, temperature 98.2 F (36.8 C), temperature source Oral, resp. rate 19, height 5\' 1"  (1.549 m), weight 80.9 kg, SpO2 97 %. Constitutional: No distress . Vital signs reviewed. HENT: Normocephalic.  Atraumatic. Eyes: EOMI.  No discharge. Cardiovascular: No JVD. Respiratory: Normal effort.  + [place]. GI: Non-distended. Musc: MSK sternal pain, persistent Neurological: She isalert and oriented. Motor: 4+/5 throughout grossly, stable Skin: Warm and dry.  Intact. Psychiatric:Very anxious.   Assessment/Plan: 1. Functional deficits secondary to anoxic encephalopathy, debility which require 3+ hours per day of interdisciplinary therapy in a comprehensive inpatient rehab setting.  Physiatrist is providing close team supervision and 24 hour management of active medical problems listed below.  Physiatrist and rehab team continue to assess barriers to discharge/monitor patient progress toward functional and medical goals  Care Tool:  Bathing  Bathing activity did not occur: Safety/medical concerns Body parts bathed by  patient: Right arm, Left arm, Chest, Abdomen, Front perineal area, Buttocks, Right upper leg, Left upper leg, Right lower leg, Left lower leg, Face         Bathing assist Assist Level: Supervision/Verbal cueing     Upper Body Dressing/Undressing Upper body dressing   What is the patient wearing?: Pull over shirt    Upper body assist Assist Level: Set up assist    Lower Body Dressing/Undressing Lower body dressing      What is the patient wearing?: Pants, Underwear/pull up     Lower body assist Assist for lower body dressing: Supervision/Verbal cueing     Toileting Toileting    Toileting assist Assist for toileting: Supervision/Verbal cueing     Transfers Chair/bed transfer  Transfers assist     Chair/bed transfer assist level: Supervision/Verbal cueing     Locomotion Ambulation   Ambulation assist      Assist level: Contact Guard/Touching assist Assistive device: Walker-rolling Max distance: 150'   Walk 10 feet activity   Assist     Assist level: Contact Guard/Touching assist Assistive device: Walker-rolling   Walk 50 feet activity   Assist    Assist level: Contact Guard/Touching assist Assistive device: Walker-rolling    Walk 150 feet activity   Assist Walk 150 feet activity did not occur: Safety/medical concerns(decreased strength/endurance)  Assist level: Contact Guard/Touching assist Assistive device: Walker-rolling    Walk 10 feet on uneven surface  activity   Assist     Assist level: Contact Guard/Touching assist Assistive device: Aeronautical engineer Will patient use wheelchair at  discharge?: Yes Type of Wheelchair: Manual    Wheelchair assist level: Supervision/Verbal cueing Max wheelchair distance: 61'    Wheelchair 50 feet with 2 turns activity    Assist        Assist Level: Supervision/Verbal cueing   Wheelchair 150 feet activity     Assist Wheelchair 150 feet activity did  not occur: Safety/medical concerns(decreased strength/endurance)        Medical Problem List and Plan: 1.Anoxic encephalopath and debilitysecondary tocardiac arrest from acute on chronic respiratory failure.  Continue CIR  Plan for d/c 5/18  Will see patient for transitional care management in 1-2 weeks post-discharge 2. Antithrombotics: -DVT/anticoagulation:Pharmaceutical:Heparin -antiplatelet therapy: N/A 3. Pain Management:Tylenol prnand tramadol  As needed Robaxin added on 5/14, increased and scheduled on 5/15  -anxiety mgt   -heat and ice to sternum, reassured her it will improve  4. Mood: team providing ego support -antipsychotic agents:  low dose seroquel at night to help with restlessness and sleep  -lexapro and xanax for anxiety 5. Neuropsych: This patientis ?not fully of making decisions on hisown behalf. 6. Skin/Wound Care:routine pressure relief measures. 7. Fluids/Electrolytes/Nutrition:Strict I/O. Renal diet with 1200 cc FR.    Dietitian consulted 8. COPD with acute on chronic respiratory failure:   To need supplemental oxygen -wean as tolerated.  Unable to wean per report, will document desaturations-discussed with nursing.  -used at HS only at home  -Encourage pulmonary hygiene/flutter valve use. Continue  duonebs tid.   -prednisone 30mg  qd x 2 days, decreased to 20 mg x 2 days on 5/14, decrease to 10 again on 5/16  9. OSA:Now requires BIPAPwith oxygen at nights.   Refused last night 10 Spinal stenosis/chronic pain:Used tylenol prn at home. 11. Acute renal failure/HD dependent: HD to continue  Creatinine 2.23 on 5/14  Recs per nephro   Plan to remove HD cath per nursing 12. HTN: Norvasc. Monitor BID.  Relatively controlled on 5/15 13. Anemia of chronic disease:Stable overall. Continue to monitor  Hemoglobin 10.0 on 5/13  Continue to monitor 14.  Steroid-induced hyperglycemia  Continue monitor with  steroid taper  ?  Improving on 5/14  Greater than 35 minutes spent, with greater than 30 minutes in counseling regarding after mentioned  LOS: 6 days A FACE TO [place] 06/02/2018, 11:54 AM

## 2018-06-02 NOTE — Plan of Care (Signed)
  Problem: RH BOWEL ELIMINATION Goal: RH STG MANAGE BOWEL WITH ASSISTANCE Description STG Manage Bowel with Assistance. Mod I  Outcome: Progressing   Problem: RH BLADDER ELIMINATION Goal: RH STG MANAGE BLADDER WITH ASSISTANCE Description STG Manage Bladder With Assistance. Mod I  Outcome: Progressing   Problem: RH SKIN INTEGRITY Goal: RH STG ABLE TO PERFORM INCISION/WOUND CARE W/ASSISTANCE Description STG Able To Perform Incision/Wound Care With Assistance. Mod I  Outcome: Progressing   Problem: RH SAFETY Goal: RH STG ADHERE TO SAFETY PRECAUTIONS W/ASSISTANCE/DEVICE Description STG Adhere to Safety Precautions With Assistance/Device. Mod I  Outcome: Progressing   Problem: RH KNOWLEDGE DEFICIT GENERAL Goal: RH STG INCREASE KNOWLEDGE OF SELF CARE AFTER HOSPITALIZATION Description Patient will be able to verbalize self care at home with cues/handouts  Outcome: Progressing

## 2018-06-02 NOTE — Progress Notes (Signed)
Speech Language Pathology Discharge Summary  Patient Details  Name: YVONNE STOPHER MRN: [id number] Date of Birth: 25-Jun-1970  Today's Date: 06/02/2018 SLP Individual Time: 4599-7741 SLP Individual Time Calculation (min): 60 min   Skilled Therapeutic Interventions:  Skilled treatment session focused on completing education with pt to achieve safe discharge plan. SLP facilitated by providing strategies to aid with memory, establish routine and aided in use of smart phone to help create calendar of future appointments.      Patient has met 7 of 7 long term goals.  Patient to discharge at overall Min;Supervision level.    Clinical Impression/Discharge Summary:   Pt has made progress in skilled St and as a result she has met her LTGs and is discharging at supervision to [place] [facility] for higher level cognitive tasks such as recall of new information, executive function and safety awareness. As such, pt would benefit from further HHST to target higher level cognition.   Care Partner:  Caregiver Able to Provide Assistance: Yes  Type of Caregiver Assistance: Physical;Cognitive  Recommendation:  Home Health SLP;24 hour supervision/assistance  Rationale for SLP Follow Up: Reduce caregiver burden;Maximize cognitive function and independence   Equipment:   N/A  Reasons for discharge: Discharged from hospital;Treatment goals met   Patient/Family Agrees with Progress Made and Goals Achieved: Yes    Giavanna Kang 06/02/2018, 2:21 PM

## 2018-06-02 NOTE — Care Management (Signed)
[facility] Individual Statement of Services  Patient Name:  Misty Green  Date:  05/31/2018  Welcome to the [facility].  Our goal is to provide you with an individualized program based on your diagnosis and situation, designed to meet your specific needs.  With this comprehensive rehabilitation program, you will be expected to participate in at least 3 hours of rehabilitation therapies Monday-Friday, with modified therapy programming on the weekends.  Your rehabilitation program will include the following services:  Physical Therapy (PT), Occupational Therapy (OT), Speech Therapy (ST), 24 hour per day rehabilitation nursing, Case Management (Social Worker), Rehabilitation Medicine, Nutrition Services and Other  Weekly team conferences will be held on WEdnesdays to discuss your progress.  Your Social Worker will talk with you frequently to get your input and to update you on team discussions.  Team conferences with you and your family in attendance may also be held.  Expected length of stay: 5-7 days  Overall anticipated outcome: supervision  Depending on your progress and recovery, your program may change. Your Social Worker will coordinate services and will keep you informed of any changes. Your Social Worker's name and contact numbers are listed  below.  The following services may also be recommended but are not provided by the [facility] will be made to provide these services after discharge if needed.  Arrangements include referral to agencies that provide these services.  Your insurance has been verified to be:  [facility] Your primary doctor is:  Aeronautical engineer  Pertinent information will be shared with your doctor and your insurance company.  Social Worker:  [place], [place] or ([phone]   Information  discussed with and copy given to patient by: Lennart Pall, 05/31/2018, 3:41 PM

## 2018-06-02 NOTE — Progress Notes (Signed)
SATURATION QUALIFICATIONS: (This note is used to comply with regulatory documentation for home oxygen)  Patient Saturations on Room Air at Rest = 95%  Patient Saturations on Room Air while Ambulating = 87%  Patient Saturations on 2 Liters of oxygen while Ambulating = 94%  Please briefly explain why patient needs home oxygen: patient O2 sat goes down when ambulating, patient needs home O2.

## 2018-06-02 NOTE — Plan of Care (Signed)
Nutrition Education Note  RD consulted for nutrition education with no specification regarding diet type or disease state. Pt was requiring HD for AKI likely secondary to ischemic ATN. HD was started 4/28 and last date pt received HD was 5/11.  Per Nephrology note from 5/14, pt will no longer require HD at discharge due to improved UOP and labs showing improved renal function. Outpatient HD chair has been cancelled. HD catheter removed today.  Spoke via phone call with Leanora Cover, MD. Dr. Johnney Ou confirms that pt will not need to follow a renal diet at discharge.  Discussed this with pt and left a VM for attending MD. Later discussed via phone call with attending MD. Pt reported being confused about what diet she was supposed to follow at discharge. Provided clarification after speaking with Nephrology.  Pt is currently on a Renal diet with 1200 ml fluid restriction and does not need to continue with this restrictive diet as she is no longer requiring HD due to improved kidney function.  Pt with a history of CHF. RD provided some education regarding low sodium diet.  Pt reports that she does not use salt when cooking or at the table. Pt states that she recently got an air fryer and has been enjoying using it to cook chicken and vegetables in a healthier way.  RD attached  "Low Sodium Nutrition Therapy" handout from the Academy of Nutrition and Dietetics to pt's discharge summary. Reviewed patient's dietary recall. Provided examples on ways to decrease sodium intake in diet. Discouraged intake of processed foods and use of salt shaker. Encouraged fresh fruits and vegetables as well as whole grain sources of carbohydrates to maximize fiber intake.   RD discussed why it is important for patient to adhere to diet recommendations, and emphasized the role of fluids, foods to avoid, and importance of weighing self daily. Teach back method used.  Expect good compliance.  Body mass index is 33.7 kg/m. Pt  meets criteria for obesity class I based on current BMI.  Current diet order is Renal with 1200 ml fluid restriction. Patient is consuming approximately 50-100% of meals at this time. Labs and medications reviewed. No further nutrition interventions warranted at this time. RD contact information provided. If additional nutrition issues arise, please re-consult RD.    Gaynell Face, MS, RD, LDN Inpatient Clinical Dietitian Pager: [phone] Weekend/After Hours: [phone]

## 2018-06-02 NOTE — Procedures (Signed)
Pre procedural Dx: ESRD Post procedural Dx: Same  Successful removal of tunneled HD catheter  EBL: None No immediate complications.  Ronny Bacon, MD Pager #: [phone]

## 2018-06-02 NOTE — Progress Notes (Signed)
Occupational Therapy Session Note  Patient Details  Name: Misty Green MRN: [id number] Date of Birth: 1970/11/06  Today's Date: 06/02/2018 OT Individual Time: 6067-7034 OT Individual Time Calculation (min): 45 min (missed 15 min due to pain and fatigue)   Short Term Goals: Week 1:  OT Short Term Goal 1 (Week 1): STG=LTG d/t ELOS  Skilled Therapeutic Interventions/Progress Updates:    Pt received in bed stating she was still very tired and just hurting so much from the CPR efforts. Pt declined a bath. She agreed to try a few exercises. Put on a country music station to help motivate pt.  Pt sat to EOB and then stood with S.  Using the back of an arm chair for support, she worked on various standing balance exercises for 5 min at a time, then a short rest: side stepping, forward tapping, mini squats, heel and toe raises.  Pt sat and talked awhile about her grandkids.  She then stated she was just too tired to do more.  Pt resting in bed with all needs met and alarm set.     Therapy Documentation Precautions:  Precautions Precautions: Fall Precaution Comments: on 3L 02 at night at baseline, ostomy Restrictions Weight Bearing Restrictions: No  Pain: Pain Assessment Pain Score: 8  Pain Type: Acute pain Pain Location: Chest Pain Orientation: Mid Pain Descriptors / Indicators: Aching Pain Onset: On-going Pain Intervention(s): Rest    Therapy/Group: Individual Therapy  [place] 06/02/2018, 1:10 PM

## 2018-06-02 NOTE — Progress Notes (Addendum)
Patient has home CPAP unit but it isn't working properly, she doesn't want our machine at this time and is having issues with her unit. She states she will be going home Monday and she will be fine. Explained if she started to have any issues we would come and set her up on our machine and mask and try to get her as comfortable as possible.

## 2018-06-02 NOTE — Consult Note (Addendum)
[facility] Nurse ostomy consult note Reason for Consult: Consult requested to assist with obtaining r ostomy supplies.  Pt is familiar to the [facility] team from colostomy surgery performed in Dec 2019.  She states her family assists with pouch changes when at home.  She uses a barrier ring and 2 piece pouching system, 2 3/4 inches.  Current pouch is intact with good seal, but patient is concerned that there is no output. Did not remove the pouch; stoma visualized through intact pouch, it is red and viable, small amt brown stool on the stoma but none in the pouch.  Discussed with patient that she may be constipated related to meds given while recently intubated, which can decrease GI motility.  She denies pain or cramping at this time. She can request a laxitive from her primary team if needed.  Supplies ordered to the bedside for staff nurses to change pouching system twice a week and PRN if leaking. Please re-consult if further assistance is needed.  Thank-you,  Julien Girt MSN, [place], [place], [place], [place]

## 2018-06-03 MED ORDER — HYDROCODONE-ACETAMINOPHEN 5-325 MG PO TABS
0.5000 | ORAL_TABLET | Freq: Four times a day (QID) | ORAL | Status: DC | PRN
  Administered 2018-06-03 – 2018-06-04 (×4): 1 via ORAL
  Filled 2018-06-03 (×4): qty 1

## 2018-06-03 MED ORDER — IPRATROPIUM-ALBUTEROL 0.5-2.5 (3) MG/3ML IN SOLN
3.0000 mL | Freq: Two times a day (BID) | RESPIRATORY_TRACT | Status: DC
  Administered 2018-06-03 – 2018-06-05 (×4): 3 mL via RESPIRATORY_TRACT
  Filled 2018-06-03 (×4): qty 3

## 2018-06-03 NOTE — Progress Notes (Addendum)
Misty Green is a 48 y.o. female February 28, 1970 [id number]  Subjective: The patient is complaining of severe anterior chest pain in the sternal region.  No relief with tramadol.  She did not sleep well.. Feeling OK otherwise.  Objective: Vital signs in last 24 hours: Temp:  [98.2 F (36.8 C)-98.3 F (36.8 C)] 98.3 F (36.8 C) (05/16 0613) Pulse Rate:  [78-84] 78 (05/16 0613) Resp:  [16-18] 18 (05/16 0613) BP: (122-145)/(68-78) 145/78 (05/16 0613) SpO2:  [91 %-100 %] 96 % (05/16 0753) Weight:  [81.1 kg] 81.1 kg (05/16 0612) Weight change: 0.2 kg Last BM Date: 06/02/18  Intake/Output from previous day: 05/15 0701 - 05/16 0700 In: 1080 [P.O.:1080] Out: 1800 [Urine:1500; Stool:300] Last cbgs: CBG (last 3)  No results for input(s): GLUCAP in the last 72 hours.   Physical Exam General: No apparent distress   HEENT: not dry Lungs: Normal effort. Lungs clear to auscultation, no crackles or wheezes. Cardiovascular: Regular rate and rhythm, no edema Abdomen: S/NT/ND; BS(+) Musculoskeletal:  unchanged.  Painful sternum palpation Neurological: No new neurological deficits Wounds: N/A    Skin: clear   Mental state: Alert, cooperative    Lab Results: BMET    Component Value Date/Time   NA 145 06/01/2018 0623   K 3.6 06/01/2018 0623   CL 104 06/01/2018 0623   CO2 29 06/01/2018 0623   GLUCOSE 91 06/01/2018 0623   BUN 41 (H) 06/01/2018 0623   CREATININE 2.23 (H) 06/01/2018 0623   CALCIUM 9.3 06/01/2018 0623   GFRNONAA 25 (L) 06/01/2018 0623   GFRAA 29 (L) 06/01/2018 0623   CBC    Component Value Date/Time   WBC 8.9 05/31/2018 0522   RBC 3.36 (L) 05/31/2018 0522   HGB 10.0 (L) 05/31/2018 0522   HCT 32.3 (L) 05/31/2018 0522   HCT 55.1 (H) 03/22/2016 0555   PLT 216 05/31/2018 0522   MCV 96.1 05/31/2018 0522   MCH 29.8 05/31/2018 0522   MCHC 31.0 05/31/2018 0522   RDW 14.2 05/31/2018 0522   LYMPHSABS 2.2 05/15/2018 0229   MONOABS 1.1 (H) 05/15/2018 0229   EOSABS  0.0 05/15/2018 0229   BASOSABS 0.0 05/15/2018 0229    Studies/Results: Ir Removal Tun Cv Cath W/o Fl  Result Date: 06/02/2018 INDICATION: Recovered renal function, no longer in need of tunneled dialysis catheter Dialysis catheter was placed by Dr. Annamaria Boots on 05/26/2018 and functioned well throughout duration of usage. There is no sign or concern of infection EXAM: REMOVAL OF TUNNELED HEMODIALYSIS CATHETER MEDICATIONS: None COMPLICATIONS: None immediate. PROCEDURE: Informed written consent was obtained from the patient following an explanation of the procedure, risks, benefits and alternatives to treatment. A time out was performed prior to the initiation of the procedure. Maximal barrier sterile technique was utilized including caps, mask, sterile gowns, sterile gloves, large sterile drape, hand hygiene, and chlorhexidine. 1% lidocaine with epinephrine was injected under sterile conditions along the subcutaneous tunnel. Utilizing a combination of blunt dissection and gentle traction, the catheter was removed intact. Hemostasis was obtained with manual compression. A dressing was placed. The patient tolerated the procedure well without immediate post procedural complication. IMPRESSION: Successful removal of tunneled dialysis catheter. Electronically Signed   By: Sandi Mariscal M.D.   On: 06/02/2018 12:48    Medications: I have reviewed the patient's current medications.  Assessment/Plan:   1.  Anoxic encephalopathy and debility secondary to cardiac arrest from acute on chronic respiratory failure/pneumonia.  Continue CIR 2.  DVT prophylaxis with heparin 3.  Pain management/chest  pain due to mechanical compressions.  The patient states tramadol is not working.  We will try Norco with caution.  Ice to sternum PRN 4.  Emotional support.  Lexapro and Xanax as needed for anxiety 5.  Routine skin care 6.  Renal diet 7.  COPD with acute on chronic respiratory failure.  Continue with duonebs.  On prednisone  taper 8.  Sleep apnea on BiPAP with oxygen at night 9.  Spinal stenosis/chronic pain.  She use Tylenol at home PRN 10.  Acute renal failure/hemodialysis dependent.  HD to continue.  Further recommendations per nephrology 11.  Hypertension.  On Norvasc 12.  Anemia of chronic disease.  Continue to monitor hemoglobin 13.  Steroid-induced hyperglycemia.  Continue to monitor with steroid taper   Length of stay, days: [place] , MD 06/03/2018, 1:09 PM

## 2018-06-03 NOTE — Progress Notes (Signed)
Patient refuse use to bipap machine this evening

## 2018-06-04 ENCOUNTER — Inpatient Hospital Stay (HOSPITAL_COMMUNITY): Payer: 59

## 2018-06-04 MED ORDER — HYDROCODONE-ACETAMINOPHEN 5-325 MG PO TABS
1.0000 | ORAL_TABLET | Freq: Four times a day (QID) | ORAL | Status: DC | PRN
  Administered 2018-06-04: 20:00:00 2 via ORAL
  Administered 2018-06-04: 14:00:00 1 via ORAL
  Administered 2018-06-05: 03:00:00 2 via ORAL
  Filled 2018-06-04 (×3): qty 2

## 2018-06-04 NOTE — Progress Notes (Signed)
Occupational Therapy Discharge Summary  Patient Details  Name: Misty Green MRN: [id number] Date of Birth: Nov 17, 1970  Today's Date: 06/04/2018 OT Individual Time: 0930-1020 OT Individual Time Calculation (min): 50 min    Patient has met 7 of 7 long term goals due to improved activity tolerance, improved balance, postural control, ability to compensate for deficits, improved attention, improved awareness and improved coordination.  Patient to discharge at overall Supervision level.  Patient's care partner is independent to provide the necessary physical and cognitive assistance at discharge.    Reasons goals not met: Pt has met all treatment goals.   Recommendation:  Patient will benefit from ongoing skilled OT services in home health setting to continue to advance functional skills in the area of BADL.  Equipment: RW  Reasons for discharge: treatment goals met and discharge from hospital  Patient/family agrees with progress made and goals achieved: Yes   Skilled OT Intervention: Pt received supine with c/o pain as described below. Extensive discussion re d/c planning, safety awareness, condition insight, and fall risk reduction. Discussed return to activity and gave extensive education re weaning O2 and process of increasing activity gradually. Pt initially declining any OOB mobility but following rapport building, pt agreeable to complete simulated toileting. Pt completed functional mobility into bathroom with (S). She completed 3/3 toileting tasks with (S). Pt requested to empty ostomy bag and supplies were provided. Pt able to independently manage emptying and cleaning bag and reports husband does bag change. Pt completed LB dressing with (S) and stood at sink for grooming tasks. Pt returned to sitting EOB and requested to end session d/t pain in chest. 10 min skilled OT missed. Pt left with all needs met.   OT Discharge Precautions/Restrictions  Precautions Precautions:  Fall Precaution Comments: on 3L 02 at night at baseline, now on 2L [place], ostomy Restrictions Weight Bearing Restrictions: No Vital Signs Oxygen Therapy SpO2: 95 % O2 Device: Nasal Cannula O2 Flow Rate (L/min): 2 L/min Pain Pain Assessment Pain Scale: 0-10 Pain Score: 7  Pain Type: Acute pain Pain Location: Chest Pain Orientation: Mid Pain Descriptors / Indicators: Aching Pain Onset: On-going Pain Intervention(s): Emotional support ADL ADL Eating: Independent Where Assessed-Eating: Edge of bed Grooming: Supervision/safety Where Assessed-Grooming: Standing at sink Upper Body Bathing: Supervision/safety Where Assessed-Upper Body Bathing: Standing at sink Where Assessed-Lower Body Bathing: Sitting at sink Upper Body Dressing: Supervision/safety Where Assessed-Upper Body Dressing: Edge of bed Lower Body Dressing: Supervision/safety Where Assessed-Lower Body Dressing: Edge of bed Toileting: Supervision/safety Where Assessed-Toileting: Glass blower/designer: Close supervision Toilet Transfer Method: Counselling psychologist: Ambulance person Transfer: Close supervison Clinical cytogeneticist Method: Optometrist: Energy manager: Close supervision Social research officer, government Method: Ambulating Vision Baseline Vision/History: Wears glasses Wears Glasses: At all times Patient Visual Report: No change from baseline Vision Assessment?: No apparent visual deficits Perception  Perception: Within Functional Limits Praxis Praxis: Intact Cognition Overall Cognitive Status: Within Functional Limits for tasks assessed Arousal/Alertness: Awake/alert Orientation Level: Oriented X4 Attention: Selective Sustained Attention: Appears intact Sustained Attention Impairment: Functional complex Selective Attention: Appears intact Memory: Impaired Memory Impairment: Decreased recall of new information Awareness: Appears intact Awareness  Impairment: Anticipatory impairment Problem Solving: Impaired Problem Solving Impairment: Functional complex Safety/Judgment: Appears intact Sensation Sensation Light Touch: Impaired Detail Central sensation comments: L posterior leg numbness from prior sx Hot/Cold: Appears Intact Proprioception: Appears Intact Coordination Gross Motor Movements are Fluid and Coordinated: Yes Fine Motor Movements are Fluid and Coordinated: Yes Finger Nose Finger Test:  WFL Motor  Motor Motor: Other (comment) Motor - Discharge Observations: generalized deconditioning Mobility  Bed Mobility Bed Mobility: Rolling Right;Rolling Left;Supine to Sit Rolling Right: Supervision/verbal cueing Rolling Left: Supervision/Verbal cueing Supine to Sit: Supervision/Verbal cueing Transfers Sit to Stand: Supervision/Verbal cueing Stand to Sit: Supervision/Verbal cueing  Trunk/Postural Assessment  Cervical Assessment Cervical Assessment: Exceptions to WFL(forward head posture) Thoracic Assessment Thoracic Assessment: Exceptions to WFL(slightly kyphotic) Lumbar Assessment Lumbar Assessment: Exceptions to WFL(posterior pelvic tilt) Postural Control Postural Control: Deficits on evaluation Righting Reactions: delayed  Balance Balance Balance Assessed: Yes Static Sitting Balance Static Sitting - Balance Support: Feet supported Static Sitting - Level of Assistance: 6: Modified independent (Device/Increase time) Dynamic Sitting Balance Dynamic Sitting - Balance Support: Feet supported Dynamic Sitting - Level of Assistance: 6: Modified independent (Device/Increase time) Dynamic Sitting - Balance Activities: Reaching across midline;Forward lean/weight shifting Static Standing Balance Static Standing - Balance Support: During functional activity;Bilateral upper extremity supported Static Standing - Level of Assistance: 5: Stand by assistance Dynamic Standing Balance Dynamic Standing - Balance Support: During  functional activity;Bilateral upper extremity supported Dynamic Standing - Level of Assistance: 5: Stand by assistance Dynamic Standing - Balance Activities: Reaching across midline;Forward lean/weight shifting Extremity/Trunk Assessment RUE Assessment RUE Assessment: Exceptions to [facility] Active Range of Motion (AROM) Comments: WFL for all functional activity General Strength Comments: generalized weakness, but WFL for functional mobility LUE Assessment LUE Assessment: Exceptions to [facility] Active Range of Motion (AROM) Comments: WFL for functional mobility General Strength Comments: generalized weakness, but [facility] for functional mobiltiy   Curtis Sites 06/04/2018, 10:42 AM

## 2018-06-04 NOTE — Progress Notes (Signed)
Physical Therapy Session Note  Patient Details  Name: Misty Green MRN: [id number] Date of Birth: 1970/08/15  Today's Date: 06/04/2018 PT Individual Time: 2706-2376 PT Individual Time Calculation (min): 20 min   Short Term Goals: Week 1:  PT Short Term Goal 1 (Week 1): STG=LTG due to short ELOS.  Skilled Therapeutic Interventions/Progress Updates:    Pt observed to be ambulating back from bathroom to bed upon PT arrival at supervision level. Pt's room is dark and pt declining any therapy intervention. Reports she is hurting too bad and did not sleep at all last night. RN provided medication during time with patient. Education provided on home set-up, importance of continued activity, HEP, and positioning in the bed (pt reports unable to tolerate laying flat) during sleep in sidelying or use of pillows for semi-upright posture (pt also reports she would sleep in recliner). Pt declines practicing stairs for home entry ("my husband can do that"), any kind of gait or upright mobility ("I will be fine once I get home"), or exercises despite encouragement. Pt repositioned self in supine.  Pt left in bed with bed alarm intact.   Therapy Documentation Precautions:  Precautions Precautions: Fall Precaution Comments: on 3L 02 at night at baseline, now on 2L [place], ostomy Restrictions Weight Bearing Restrictions: No General: PT Amount of Missed Time (min): 40 Minutes PT Missed Treatment Reason: Patient unwilling to participate;Pain  Pain: Pain Assessment Pain Scale: 0-10 Pain Score: 7  Pain Type: Acute pain Pain Location: Chest Pain Orientation: Mid Pain Descriptors / Indicators: Aching Pain Onset: On-going Pain Intervention(s): Emotional support     Therapy/Group: Individual Therapy  [name] [place], PT, DPT, CBIS  06/04/2018, 1:40 PM

## 2018-06-04 NOTE — Progress Notes (Signed)
Misty Green is a 48 y.o. female 30-Sep-1970 [id number]  Subjective: The patient is complaining of severe pain in the sternum and anterior ribs not relieved with Norco.  She rates her pain at 10 out of pain and states that the pain is unbearable.  It is worse with range of motion  Objective: Vital signs in last 24 hours: Temp:  [97.7 F (36.5 C)-98.3 F (36.8 C)] 97.7 F (36.5 C) (05/17 0601) Pulse Rate:  [71-82] 77 (05/17 0601) Resp:  [16-18] 16 (05/17 0601) BP: (120-136)/(63-83) 123/64 (05/17 0601) SpO2:  [94 %-96 %] 95 % (05/17 0738) Weight:  [82.2 kg] 82.2 kg (05/17 0601) Weight change: 1.1 kg Last BM Date: 06/03/18  Intake/Output from previous day: 05/16 0701 - 05/17 0700 In: 1080 [P.O.:1080] Out: 2450 [Urine:2200; Stool:250] Last cbgs: CBG (last 3)  No results for input(s): GLUCAP in the last 72 hours.   Physical Exam General: No apparent distress   HEENT: not dry Lungs: Normal effort. Lungs clear to auscultation, no crackles or wheezes. Cardiovascular: Regular rate and rhythm, no edema Abdomen: S/NT/ND; BS(+) Musculoskeletal:  Unchanged -sternum and anterior ribs are tender to palpation bilaterally Neurological: No new neurological deficits Wounds: N/A    Skin: clear   Mental state: Alert, oriented, cooperative    Lab Results: BMET    Component Value Date/Time   NA 145 06/01/2018 0623   K 3.6 06/01/2018 0623   CL 104 06/01/2018 0623   CO2 29 06/01/2018 0623   GLUCOSE 91 06/01/2018 0623   BUN 41 (H) 06/01/2018 0623   CREATININE 2.23 (H) 06/01/2018 0623   CALCIUM 9.3 06/01/2018 0623   GFRNONAA 25 (L) 06/01/2018 0623   GFRAA 29 (L) 06/01/2018 0623   CBC    Component Value Date/Time   WBC 8.9 05/31/2018 0522   RBC 3.36 (L) 05/31/2018 0522   HGB 10.0 (L) 05/31/2018 0522   HCT 32.3 (L) 05/31/2018 0522   HCT 55.1 (H) 03/22/2016 0555   PLT 216 05/31/2018 0522   MCV 96.1 05/31/2018 0522   MCH 29.8 05/31/2018 0522   MCHC 31.0 05/31/2018 0522   RDW  14.2 05/31/2018 0522   LYMPHSABS 2.2 05/15/2018 0229   MONOABS 1.1 (H) 05/15/2018 0229   EOSABS 0.0 05/15/2018 0229   BASOSABS 0.0 05/15/2018 0229    Studies/Results: No results found.  Medications: I have reviewed the patient's current medications.  Assessment/Plan:  1.  Anoxic encephalopathy and debility secondary to cardiac arrest from acute on chronic respiratory failure/pneumonia.  Continue CIR 2.  DVT prophylaxis with heparin 3.  Pain management/chest wall pain due to recent mechanical compressions tramadol did not help.  Norco did not help at 5/325 dose.  Increase Norco to higher dose.  Ice to sternum PRN 4.  Emotional support.  Lexapro and Xanax 5.  Routine skin care 6.  Renal diet 7.  COPD with acute on chronic respiratory failure.  Continue with duonebs.  On prednisone taper 8.  Sleep apnea on BiPAP with oxygen at night 9.  Spinal stenosis/chronic pain.  She was using Tylenol at home PRN 10.  Acute renal failure/hemodialysis dependent.  HD to continue.  Further recommendations per nephrology 11.  Hypertension.  On Norvasc 12.  Anemia of chronic disease.  Continue to monitor hemoglobin 13.  Steroid-induced hyperglycemia.  Continue to monitor with steroid taper    Length of stay, days: 8  Walker Kehr , MD 06/04/2018, 1:36 PM

## 2018-06-04 NOTE — Progress Notes (Signed)
Physical Therapy Discharge Summary  Patient Details  Name: Misty Green MRN: [id number] Date of Birth: November 26, 1970    Patient has met 7 of 9 long term goals due to improved activity tolerance, improved balance and increased strength.  Patient to discharge at an ambulatory level Supervision.   Patient's care partner was unavailable, but pt reported she could tell him how to assist her to provide the necessary physical assistance at discharge.  Reasons goals not met: Patient requiring S for safe in home ambulation with RW due to generalized weakness and was unable to complete 12 step negotiation due to fatigue level.   Patient progressed towards all goals, but was unwilling to participate in full discharge assessment on last day of therapies.  Goals partially met during prior treatment sessions as noted above.   Recommendation:  Patient will benefit from ongoing skilled PT services in home health setting as well as HEP issued during rehab stay to continue to advance safe functional mobility, address ongoing impairments in strength, balance, endurance, pain, functional mobility, and minimize fall risk.  Equipment: No equipment provided  Reasons for discharge: treatment goals met and discharge from hospital  Patient/family agrees with progress made and goals achieved: Yes  PT Discharge Precautions/Restrictions Precautions Precautions: Fall Precaution Comments: on 3L 02 at night at baseline, now on 2L [place], ostomy Restrictions Weight Bearing Restrictions: No Vision/Perception     Cognition Overall Cognitive Status: Within Functional Limits for tasks assessed Sensation Sensation Light Touch: Impaired Detail Central sensation comments: L posterior leg numbness from prior sx Coordination Gross Motor Movements are Fluid and Coordinated: Yes Motor  Motor Motor: Other (comment) Motor - Skilled Clinical Observations: generalized deconditioning and decreased balance  Mobility Bed  Mobility Bed Mobility: Sit to Supine;Supine to Sit Supine to Sit: Independent with assistive device Sit to Supine: Independent with assistive device Transfers Transfers: Sit to Stand;Stand Pivot Transfers Sit to Stand: Supervision/Verbal cueing Stand to Sit: Supervision/Verbal cueing Stand Pivot Transfers: Supervision/Verbal cueing Locomotion  Gait Ambulation: (only observed 5' ambulation at supervision level) Gait Assistance: Supervision/Verbal cueing Stairs / Additional Locomotion Stairs: (refused)  Trunk/Postural Assessment  Cervical Assessment Cervical Assessment: Within Functional Limits Thoracic Assessment Thoracic Assessment: Exceptions to WFL(kyphotic posture) Lumbar Assessment Lumbar Assessment: Exceptions to WFL(posterior pelvic tilt)  Balance   Extremity Assessment  RUE Assessment RUE Assessment: Exceptions to [facility] Active Range of Motion (AROM) Comments: WFL for all functional activity General Strength Comments: generalized weakness, but WFL for functional mobility LUE Assessment LUE Assessment: Exceptions to [facility] Active Range of Motion (AROM) Comments: WFL for functional mobility General Strength Comments: generalized weakness, but WFL for functional mobiltiy RLE Assessment General Strength Comments: grossly observed to be [facility]; decreased muscular endurance LLE Assessment General Strength Comments: grossly observed to be [facility]; decreased muscular endurance    [name] [facility] 06/04/2018, 2:39 PM   Magda Kiel, PT 06/05/2018

## 2018-06-05 ENCOUNTER — Inpatient Hospital Stay (HOSPITAL_COMMUNITY): Payer: 59

## 2018-06-05 DIAGNOSIS — J440 Chronic obstructive pulmonary disease with acute lower respiratory infection: Secondary | ICD-10-CM

## 2018-06-05 LAB — BLOOD GAS, ARTERIAL
Acid-Base Excess: 9.8 mmol/L — ABNORMAL HIGH (ref 0.0–2.0)
Bicarbonate: 35.2 mmol/L — ABNORMAL HIGH (ref 20.0–28.0)
Drawn by: 227661
O2 Content: 2 L/min
O2 Saturation: 92.7 %
Patient temperature: 98.6
pCO2 arterial: 61.4 mmHg — ABNORMAL HIGH (ref 32.0–48.0)
pH, Arterial: 7.376 (ref 7.350–7.450)
pO2, Arterial: 63.7 mmHg — ABNORMAL LOW (ref 83.0–108.0)

## 2018-06-05 MED ORDER — PREDNISONE 20 MG PO TABS: 20.0000 mg | ORAL_TABLET | Freq: Every day | ORAL | 1 refills | Status: DC

## 2018-06-05 MED ORDER — QUETIAPINE FUMARATE 25 MG PO TABS: 25.0000 mg | ORAL_TABLET | Freq: Every day | ORAL | 1 refills | Status: DC

## 2018-06-05 MED ORDER — METHOCARBAMOL 500 MG PO TABS: 1000.0000 mg | ORAL_TABLET | Freq: Three times a day (TID) | ORAL | 1 refills | Status: DC

## 2018-06-05 MED ORDER — TRAMADOL HCL 50 MG PO TABS: 50.0000 mg | ORAL_TABLET | Freq: Four times a day (QID) | ORAL | 0 refills | Status: DC | PRN

## 2018-06-05 MED ORDER — TRAMADOL HCL 50 MG PO TABS
50.0000 mg | ORAL_TABLET | Freq: Four times a day (QID) | ORAL | Status: DC | PRN
  Administered 2018-06-05 (×2): 50 mg via ORAL
  Filled 2018-06-05 (×2): qty 1

## 2018-06-05 MED ORDER — AMLODIPINE BESYLATE 10 MG PO TABS: 10.0000 mg | ORAL_TABLET | Freq: Every day | ORAL | 0 refills | Status: DC

## 2018-06-05 MED ORDER — LIDOCAINE 5 % EX PTCH
1.0000 | MEDICATED_PATCH | CUTANEOUS | Status: DC
  Administered 2018-06-05: 09:00:00 1 via TRANSDERMAL
  Filled 2018-06-05: qty 1

## 2018-06-05 MED ORDER — PREDNISONE 10 MG PO TABS
10.0000 mg | ORAL_TABLET | Freq: Once | ORAL | Status: AC
  Administered 2018-06-05: 15:00:00 10 mg via ORAL
  Filled 2018-06-05: qty 1

## 2018-06-05 MED ORDER — POLYETHYLENE GLYCOL 3350 17 G PO PACK: 17.0000 g | PACK | Freq: Two times a day (BID) | ORAL | 0 refills | Status: DC

## 2018-06-05 MED ORDER — ALPRAZOLAM 0.25 MG PO TABS: 0.2500 mg | ORAL_TABLET | Freq: Three times a day (TID) | ORAL | 0 refills | Status: DC

## 2018-06-05 MED ORDER — ESCITALOPRAM OXALATE 10 MG PO TABS: 10.0000 mg | ORAL_TABLET | Freq: Every day | ORAL | 1 refills | Status: DC

## 2018-06-05 MED ORDER — MELATONIN 3 MG PO TABS: 3.0000 mg | ORAL_TABLET | Freq: Every day | ORAL | 0 refills | Status: DC

## 2018-06-05 MED ORDER — PREDNISONE 20 MG PO TABS: 20.0000 mg | ORAL_TABLET | Freq: Every day | ORAL | Status: DC

## 2018-06-05 MED ORDER — ACETAMINOPHEN 325 MG PO TABS: 325.0000 mg | ORAL_TABLET | ORAL | Status: AC | PRN

## 2018-06-05 MED ORDER — PANTOPRAZOLE SODIUM 40 MG PO TBEC: 40.0000 mg | DELAYED_RELEASE_TABLET | Freq: Two times a day (BID) | ORAL | 1 refills | Status: DC

## 2018-06-05 MED ORDER — LIDOCAINE 5 % EX PTCH: MEDICATED_PATCH | CUTANEOUS | 0 refills | Status: DC

## 2018-06-05 NOTE — Progress Notes (Signed)
Patient has history of COPD and OSA on CPAP with 3 L oxygen at bedtime. Per conversation with patient and husband--she was compliant with CPAP use at home and was admitted on 05/12/18 with cardiac arrest felt to be due to respiratory issues/hypercarbic respiratory failure. She was treated with  BIPAP with decrease in pCO2 to 53 and improvement in oxygen to 100.  She reported issues with increasing SOB --CXR/CT chest negative for acute process. ABG done revealing evidence of compensated respiratory acidosis. She reports that she will be compliant with BIPAP at home if fitted properly. Note ABG from admission on 4/24 as well as those done today.     Results for Misty Green, Misty Green (MRN [id number]) as of 06/05/2018 16:48  Ref. Range 05/12/2018 21:47  pH, Ven Latest Ref Range: 7.250 - 7.430  7.246 (L)  pCO2, Ven Latest Ref Range: 44.0 - 60.0 mmHg 85.2 (HH)  pO2, Ven Latest Ref Range: 32.0 - 45.0 mmHg 47.0 (H)  TCO2 Latest Ref Range: 22 - 32 mmol/L 40 (H)  Acid-Base Excess Latest Ref Range: 0.0 - 2.0 mmol/L 6.0 (H)  Bicarbonate Latest Ref Range: 20.0 - 28.0 mmol/L 37.0 (H)  O2 Saturation Latest Units: % 73.0  Patient temperature Unknown HIDE      Results for Misty Green, Misty Green (MRN [id number]) as of 06/05/2018 16:48  Ref. Range 06/05/2018 11:15 06/05/2018 12:57  Sample type Unknown ARTERIAL DRAW   Delivery systems Unknown NASAL CANNULA   O2 Content Latest Units: L/min 2.0   pH, Arterial Latest Ref Range: 7.350 - 7.450  7.376   pCO2 arterial Latest Ref Range: 32.0 - 48.0 mmHg 61.4 (H)   pO2, Arterial Latest Ref Range: 83.0 - 108.0 mmHg 63.7 (L)   Acid-Base Excess Latest Ref Range: 0.0 - 2.0 mmol/L 9.8 (H)   Bicarbonate Latest Ref Range: 20.0 - 28.0 mmol/L 35.2 (H)   O2 Saturation Latest Units: % 92.7   Patient temperature Unknown 98.6   Collection site Unknown LEFT RADIAL   Allens test (pass/fail) Latest Ref Range: PASS  PASS

## 2018-06-05 NOTE — Progress Notes (Addendum)
Discussed CXR results with patient. She reports that she has not used CPAP due to poorly fitting mask but has another mask at home. Chest pain has been ongoing since admission and reports that SOB is worse.  She has not been on home advair which could be contributing to SOB.  Have had multiple conversations with patient today about CPAP v/s BIPAP use as well as compliance issues and need for justification. Per RT --she has refused to wear BIPAP in the past week  She was transitioned to CPAP per her request and has refused to wear it for at least the past 4 days with resultant SOB/hypoxia. She reports that this due to a broken part on her machine which her husband now reports that he has that part at home. Will attempt to document need and order BIPAP--she reports that she has talked with [facility] and Dr. Luan Pulling office.  CXR/CT chest negative for acute process. She has not been on her home MDI's --which we will resume. Steroids were resumed today at 20 mg with taper on outpatient basis. She was advised to wear her CPAP daily as this will help manage hypercarbia. Have moved up  pulmonary follow up with Dr. Luan Pulling for this week.

## 2018-06-05 NOTE — Plan of Care (Signed)
Pt discharging home with husband, d/c instructions reviewed by PA Pam. Pt was given a copy of instructions, O2 tank was delivered to room for home use and to transport home on O2. Pt d/c'd via wheelchair with belongings, on O2 and escorted by unit staff, husband waiting at entrance for pt.

## 2018-06-05 NOTE — Progress Notes (Addendum)
Social Work  Discharge Note  The overall goal for the admission was met for:   Discharge location: Yes - home with spouse who can provide 24/7 supervision  Length of Stay: Yes - 9 days  Discharge activity level: Yes - supervision overall.  Home/community participation: Yes  Services provided included: MD, RD, PT, OT, SLP, RN, Pharmacy and SW  Financial Services: Private Insurance: [employer]  Follow-up services arranged: Home Health: RN, PT, OT, ST via [facility], DME: portable O2 and Bipap via [facility] and Patient/Family has no preference for HH/DME agencies  Comments (or additional information):      Contact info:  Pt @ [phone]           Spouse, Bill @ [phone]  Patient/Family verbalized understanding of follow-up arrangements: Yes  Individual responsible for coordination of the follow-up plan: pt  Confirmed correct DME delivered: ,  06/05/2018    ,  

## 2018-06-05 NOTE — Progress Notes (Addendum)
[place] PHYSICAL MEDICINE & REHABILITATION PROGRESS NOTE   Subjective/Complaints: Patient seen sitting up in bed this morning.  She states that she is been unbearable pain sternal pain.  Discussed with nursing - stat CXR.  Lidoderm patch ordered.  Discussed with PA, ABG ordered, along with CT. patient later noted to be ambulating in the room with supplemental oxygen tank.  ROS: Denies CP, SOB, N/V/D  Objective:   Dg Chest 2 View  Result Date: 06/05/2018 CLINICAL DATA:  Chest pain EXAM: CHEST - 2 VIEW COMPARISON:  May 24, 2018 FINDINGS: There are scattered parenchymal lung granulomas bilaterally. There is no edema or consolidation. There is minimal scarring in the left base. Heart size and pulmonary vascularity are normal. No adenopathy. No pneumothorax. No bone lesions. IMPRESSION: Minimal scarring left base. Scattered calcified granulomas. No edema or consolidation. Heart size within normal limits. Electronically Signed   By: Lowella Grip III M.D.   On: 06/05/2018 09:06   Ct Chest Wo Contrast  Result Date: 06/05/2018 CLINICAL DATA:  Chest pain after CPR. EXAM: CT CHEST WITHOUT CONTRAST TECHNIQUE: Multidetector CT imaging of the chest was performed following the standard protocol without IV contrast. COMPARISON:  None. FINDINGS: Cardiovascular: The heart size appears within normal limits. There is no pericardial effusion. Aortic atherosclerosis. Calcification in the LAD coronary artery identified. Mediastinum/Nodes: Normal appearance of the thyroid gland. The trachea appears patent and is midline. Normal appearance of the esophagus. No mediastinal, axillary or supraclavicular adenopathy. Hilar structures suboptimally evaluated due to lack of IV contrast Lungs/Pleura: No pleural effusion identified. No airspace consolidation, atelectasis or pneumothorax identified no evidence for pulmonary contusion. Diffuse bronchial wall thickening identified bilaterally. Mild changes of centrilobular  emphysema. Innumerable calcified granulomas identified in both lungs. Anterior right lower lobe and lingular scarring identified. Pulmonary nodule within the lingula is nonspecific measuring 4 mm, image 85/4. Small noncalcified nodule in the posterior right base measures 4 mm, image 118/4. Upper Abdomen: No acute abnormality. Musculoskeletal: The sternum is intact. Multiple bilateral anterior rib deformities are identified of varying stages of healing. IMPRESSION: 1. No acute cardiopulmonary abnormalities. 2. Chronic granulomatous disease with innumerable calcified granulomas in both lungs. 3. Noncalcified lingular nodule measures 4 mm. No follow-up needed if patient is low-risk (and has no known or suspected primary neoplasm). Non-contrast chest CT can be considered in 12 months if patient is high-risk. This recommendation follows the consensus statement: Guidelines for Management of Incidental Pulmonary Nodules Detected on CT Images: From the Fleischner Society 2017; Radiology 2017; 284:228-243. 4. Aortic Atherosclerosis (ICD10-I70.0) and Emphysema (ICD10-J43.9). 5. Coronary artery calcifications 6. Bilateral anterior rib deformities of various stages of healing compatible with subacute to chronic fractures. No definite displaced acute rib fractures identified. Electronically Signed   By: Kerby Moors M.D.   On: 06/05/2018 13:55   No results for input(s): WBC, HGB, HCT, PLT in the last 72 hours. No results for input(s): NA, K, CL, CO2, GLUCOSE, BUN, CREATININE, CALCIUM in the last 72 hours.  Intake/Output Summary (Last 24 hours) at 06/05/2018 1359 Last data filed at 06/05/2018 0901 Gross per 24 hour  Intake 700 ml  Output 900 ml  Net -200 ml     Physical Exam: Vital Signs Blood pressure 118/67, pulse 80, temperature 98.1 F (36.7 C), temperature source Oral, resp. rate 18, height 5\' 1"  (1.549 m), weight 82.2 kg, SpO2 95 %. Constitutional: No distress . Vital signs reviewed. HENT: Normocephalic.   Atraumatic. Eyes: EOMI.  No discharge. Cardiovascular: No JVD. Respiratory: Normal effort.  +  [place]. GI: Non-distended. Musc: MSK sternal pain, persistent with minimal touch. Neurological: She isalert and oriented. Motor: 4+/5 throughout grossly, unchanged Skin: Warm and dry.  Intact. Psychiatric: Very anxious.   Assessment/Plan: 1. Functional deficits secondary to anoxic encephalopathy, debility which require 3+ hours per day of interdisciplinary therapy in a comprehensive inpatient rehab setting.  Physiatrist is providing close team supervision and 24 hour management of active medical problems listed below.  Physiatrist and rehab team continue to assess barriers to discharge/monitor patient progress toward functional and medical goals  Care Tool:  Bathing  Bathing activity did not occur: Safety/medical concerns Body parts bathed by patient: Right arm, Left arm, Chest, Abdomen, Front perineal area, Buttocks, Right upper leg, Left upper leg, Right lower leg, Left lower leg, Face         Bathing assist Assist Level: Supervision/Verbal cueing     Upper Body Dressing/Undressing Upper body dressing   What is the patient wearing?: Pull over shirt    Upper body assist Assist Level: Supervision/Verbal cueing    Lower Body Dressing/Undressing Lower body dressing      What is the patient wearing?: Pants, Underwear/pull up     Lower body assist Assist for lower body dressing: Supervision/Verbal cueing     Toileting Toileting    Toileting assist Assist for toileting: Supervision/Verbal cueing     Transfers Chair/bed transfer  Transfers assist     Chair/bed transfer assist level: Supervision/Verbal cueing     Locomotion Ambulation   Ambulation assist   Ambulation activity did not occur: Refused(observed pt walking from bathroom ~5' independently but declined any mobility)  Assist level: Contact Guard/Touching assist Assistive device: Walker-rolling Max  distance: 150'   Walk 10 feet activity   Assist  Walk 10 feet activity did not occur: Refused  Assist level: Contact Guard/Touching assist Assistive device: Walker-rolling   Walk 50 feet activity   Assist Walk 50 feet with 2 turns activity did not occur: Refused  Assist level: Contact Guard/Touching assist Assistive device: Walker-rolling    Walk 150 feet activity   Assist Walk 150 feet activity did not occur: Refused  Assist level: Contact Guard/Touching assist Assistive device: Walker-rolling    Walk 10 feet on uneven surface  activity   Assist Walk 10 feet on uneven surfaces activity did not occur: Refused   Assist level: Contact Guard/Touching assist Assistive device: Aeronautical engineer Will patient use wheelchair at discharge?: Yes Type of Wheelchair: Manual    Wheelchair assist level: Supervision/Verbal cueing Max wheelchair distance: 50'    Wheelchair 50 feet with 2 turns activity    Assist        Assist Level: Supervision/Verbal cueing   Wheelchair 150 feet activity     Assist Wheelchair 150 feet activity did not occur: Safety/medical concerns(decreased strength/endurance)        Medical Problem List and Plan: 1.Anoxic encephalopath and debilitysecondary tocardiac arrest from acute on chronic respiratory failure.  D/c today  Will also move up follow-up date with pulmonology.  Will see patient for transitional care management in 1-2 weeks post-discharge 2. Antithrombotics: -DVT/anticoagulation:Pharmaceutical:Heparin -antiplatelet therapy: N/A 3. Pain Management:Tylenol prnand tramadol  As needed Robaxin added on 5/14, increased and scheduled on 5/15  -anxiety mgt   -heat and ice to sternum, reassured her it will improve  4. Mood: team providing ego support -antipsychotic agents:  low dose seroquel at night to help with restlessness and sleep  -lexapro and xanax for  anxiety  Significantly contributing to shortness of breath and sternal musculoskeletal chest pain 5. Neuropsych: This patientis ?not fully of making decisions on hisown behalf. 6. Skin/Wound Care:routine pressure relief measures. 7. Fluids/Electrolytes/Nutrition:Strict I/O. Renal diet with 1200 cc FR.    Dietitian consulted 8. COPD with acute on chronic respiratory failure:   To need supplemental oxygen - unable to wean  -used at HS only at home  -Encourage pulmonary hygiene/flutter valve use. Continue  duonebs tid.   -prednisone 30mg  qd x 2 days, decreased to 20 mg x 2 days on 5/14, decrease to 10 again on 5/16, increased to 20 on 5/18   Chest x-ray/CT reviewed (CTA not performed due to recent acute kidney failure)-showing chronic granulomatous disease, lingular nodule, along with rib fractures.  Ordered incentive spirometer-discussed with nursing  Lidoderm patch ordered  ABG reviewed showing hypoxia and CO2 retention 9. OSA:   Refusing CPAP-needs to be compliant  See #8 10 Spinal stenosis/chronic pain:Used tylenol prn at home. 11. Acute renal failure/HD dependent: HD to continue  Creatinine 2.23 on 5/14  Recs per nephro   Plan to remove HD cath per nursing 12. HTN: Norvasc. Monitor BID.  Relatively controlled on 5/18 13. Anemia of chronic disease:Stable overall. Continue to monitor  Hemoglobin 10.0 on 5/13  Continue to monitor 14.  Steroid-induced hyperglycemia  Continue monitor with steroid taper  ?  Improving on 5/14  Greater than 30 minutes spent in discharge planning regarding aforementioned. Please see discharge summary as well.  LOS: 9 days A FACE TO FACE EVALUATION WAS PERFORMED  Ankit Lorie Phenix 06/05/2018, 1:59 PM

## 2018-06-05 NOTE — Discharge Summary (Addendum)
Physician Discharge Summary  Patient ID: Misty Green MRN: [id number] DOB/AGE: 10-11-1970 48 y.o.  Admit date: 05/27/2018 Discharge date: 06/05/2018  Discharge Diagnoses:  Principal Problem:   Hypoxic encephalopathy ([place]) Active Problems:   Steroid-induced hyperglycemia   Anemia of chronic disease   AKI (acute kidney injury) ([place])   Supplemental oxygen dependent   Generalized anxiety disorder   Chest wall pain   Essential hypertension   OSA (obstructive sleep apnea)   Noncompliance with CPAP treatment   Chronic obstructive pulmonary disease (HCC)   Discharged Condition:  Stable    Significant Diagnostic Studies: Dg Chest 2 View  Result Date: 06/05/2018 CLINICAL DATA:  Chest pain EXAM: CHEST - 2 VIEW COMPARISON:  May 24, 2018 FINDINGS: There are scattered parenchymal lung granulomas bilaterally. There is no edema or consolidation. There is minimal scarring in the left base. Heart size and pulmonary vascularity are normal. No adenopathy. No pneumothorax. No bone lesions. IMPRESSION: Minimal scarring left base. Scattered calcified granulomas. No edema or consolidation. Heart size within normal limits. Electronically Signed   By: Lowella Grip III M.D.   On: 06/05/2018 09:06    Ct Chest Wo Contrast  Result Date: 06/05/2018 CLINICAL DATA:  Chest pain after CPR. EXAM: CT CHEST WITHOUT CONTRAST TECHNIQUE: Multidetector CT imaging of the chest was performed following the standard protocol without IV contrast. COMPARISON:  None. FINDINGS: Cardiovascular: The heart size appears within normal limits. There is no pericardial effusion. Aortic atherosclerosis. Calcification in the LAD coronary artery identified. Mediastinum/Nodes: Normal appearance of the thyroid gland. The trachea appears patent and is midline. Normal appearance of the esophagus. No mediastinal, axillary or supraclavicular adenopathy. Hilar structures suboptimally evaluated due to lack of IV contrast Lungs/Pleura: No  pleural effusion identified. No airspace consolidation, atelectasis or pneumothorax identified no evidence for pulmonary contusion. Diffuse bronchial wall thickening identified bilaterally. Mild changes of centrilobular emphysema. Innumerable calcified granulomas identified in both lungs. Anterior right lower lobe and lingular scarring identified. Pulmonary nodule within the lingula is nonspecific measuring 4 mm, image 85/4. Small noncalcified nodule in the posterior right base measures 4 mm, image 118/4. Upper Abdomen: No acute abnormality. Musculoskeletal: The sternum is intact. Multiple bilateral anterior rib deformities are identified of varying stages of healing. IMPRESSION: 1. No acute cardiopulmonary abnormalities. 2. Chronic granulomatous disease with innumerable calcified granulomas in both lungs. 3. Noncalcified lingular nodule measures 4 mm. No follow-up needed if patient is low-risk (and has no known or suspected primary neoplasm). Non-contrast chest CT can be considered in 12 months if patient is high-risk. This recommendation follows the consensus statement: Guidelines for Management of Incidental Pulmonary Nodules Detected on CT Images: From the Fleischner Society 2017; Radiology 2017; 284:228-243. 4. Aortic Atherosclerosis (ICD10-I70.0) and Emphysema (ICD10-J43.9). 5. Coronary artery calcifications 6. Bilateral anterior rib deformities of various stages of healing compatible with subacute to chronic fractures. No definite displaced acute rib fractures identified. Electronically Signed   By: Kerby Moors M.D.   On: 06/05/2018 13:55    Ir Removal Tun Cv Cath W/o Fl  Result Date: 06/02/2018 INDICATION: Recovered renal function, no longer in need of tunneled dialysis catheter Dialysis catheter was placed by Dr. Annamaria Boots on 05/26/2018 and functioned well throughout duration of usage. There is no sign or concern of infection EXAM: REMOVAL OF TUNNELED HEMODIALYSIS CATHETER MEDICATIONS: None  COMPLICATIONS: None immediate. PROCEDURE: Informed written consent was obtained from the patient following an explanation of the procedure, risks, benefits and alternatives to treatment. A time out was performed prior to the  initiation of the procedure. Maximal barrier sterile technique was utilized including caps, mask, sterile gowns, sterile gloves, large sterile drape, hand hygiene, and chlorhexidine. 1% lidocaine with epinephrine was injected under sterile conditions along the subcutaneous tunnel. Utilizing a combination of blunt dissection and gentle traction, the catheter was removed intact. Hemostasis was obtained with manual compression. A dressing was placed. The patient tolerated the procedure well without immediate post procedural complication. IMPRESSION: Successful removal of tunneled dialysis catheter. Electronically Signed   By: Sandi Mariscal M.D.   On: 06/02/2018 12:48       Labs:  Basic Metabolic Panel: Recent Labs  Lab 05/31/18 0522 06/01/18 0623  NA 141 145  K 3.8 3.6  CL 102 104  CO2 27 29  GLUCOSE 91 91  BUN 36* 41*  CREATININE 2.38* 2.23*  CALCIUM 9.4 9.3  PHOS  --  4.2    CBC: Recent Labs  Lab 05/31/18 0522  WBC 8.9  HGB 10.0*  HCT 32.3*  MCV 96.1  PLT 216    ABG    Component Value Date/Time   PHART 7.376 06/05/2018 1115   PCO2ART 61.4 (H) 06/05/2018 1115   PO2ART 63.7 (L) 06/05/2018 1115   HCO3 35.2 (H) 06/05/2018 1115   TCO2 27 05/19/2018 0454   ACIDBASEDEF 1.1 05/20/2018 1240   O2SAT 92.7 06/05/2018 1115   CBG: No results for input(s): GLUCAP in the last 168 hours.  Brief HPI:   Misty Green is a 48 year old female with history of COPD, OSA-3L oxygen HS, CHF who was admitted on 05/11/2017 after being found unresponsive at home.  Husband reported that patient had been feeling feeling bad since 4/19 and had been sleeping on and off on day of admission.  She required CPR 13 to 14 minutes by EMS and was intubated in ED.  She was started on  epinephrine and treated with IV antibiotics and steroids for acute hypoxic/hypercapnic respiratory failure presumed due to aspiration event.  2D echo showed EF of 60 to 65% with normal systolic function.  EEG done due to encephalopathy and showed discontinuous generalized background slowing.  Neurology expressed concerns about recovery from hypoxic injury and CT/MRI head were negative for acute changes or anoxic injury.  She did develop oliguric renal failure with rising serum creatinine requiring initiation of hemodialysis dialysis on 4/28.  She was extubated to BiPAP on 4/30 and continued to have issues with agitation.  Temporary hemodialysis catheter which changed to IJ catheter on 5/5 due to ongoing hemodialysis needs.  CIR recommended due to functional decline.   Physical Exam  Constitutional: She is oriented to person, place, and time. She appears well-developed and well-nourished. Nasal cannula in place.  She was walking in the room and packing items in anticipation of discharge.   HENT:  Head: Normocephalic and atraumatic.  Eyes: Pupils are equal, round, and reactive to light. Conjunctivae are normal.  Neck: Normal range of motion. Neck supple.  Cardiovascular: Normal rate and regular rhythm.  Pulmonary/Chest: Effort normal and breath sounds normal. No stridor. No respiratory distress. She exhibits tenderness.  Abdominal: Soft. Bowel sounds are normal. She exhibits no distension. There is no abdominal tenderness.  Musculoskeletal:        General: No tenderness or edema.  Neurological: She is alert and oriented to person, place, and time.  Tangential and needed redirection to focus on conversation.  She was able to follow basic commands without difficulty.  Skin: Skin is warm and dry.  Psychiatric: Her speech is tangential.  She expresses impulsivity. She is inattentive.  Nursing note and vitals reviewed.   Hospital Course: Misty Green was admitted to rehab 05/27/2018 for inpatient  therapies to consist of PT, ST and OT at least three hours five days a week. Past admission physiatrist, therapy team and rehab RN have worked together to provide customized collaborative inpatient rehab.  She was maintained on renal diet with 1200 cc fluid restriction.  She continues to require 4 L oxygen per nasal cannula due to hypoxia with oxygen saturation in 80s at admission.  She was encouraged to use flutter valve and BiPAP was used at nights initially.  Duo nebs use during her stay.  IV Solu-Medrol was weaned off.  Anxiety managed with Lexapro and low-dose alprazolam.  Insomnia has been treated with low-dose Seroquel and melatonin.  She has been educated on current medication changes as well as need to avoid Ambien as well as prior alprazolam dose to prevent side effects of respiratory failure.  Her urine output has improved and she has had steady improvement in renal status.  Hemodialysis was was discontinued on 5/13 and tunneled hemodialysis catheter removed by radiology on 5/15.  Nephrology will contact patient for follow up after discharge.  Diet advanced to cardiac low-salt.  Serial labs shows H&H to be relatively stable in the 9-10 range.  Serial check of lytes reveal BUN/SCr at 41/2.23 and weight at discharge is at 181 pounds.  She did report ongoing issues with chest pain as well as shortness of breath on day of discharge.  Chest x-ray and CT chest showed no evidence of acute process as well as evidence of healing ribs.   Muscle relaxers added to help with musculoskeletal pain and lidocaine patch as well as as needed tramadol use for management of chest wall pain.  She did not report any improvement with brief trial of hydrocodone therefore this was discontinued. Prednisone resumed at 20 mg a day and home regimen of Spiriva and Advair also to be resumed at discharge.  ABG showed evidence of compensated acidosis and patient was noted to have been refusing BiPAP/CPAP for few days prior to  discharge.  BiPAP ordered via [place] and patient educated on need to be compliant with daily CPAP use for now.  She is set for follow-up with pulmonary this week due to concerns about compliance as well as need for monitoring.   She has required encouragement for consistent participation in therapy.  She is currently at supervision level and has progressed to supervision level    Rehab course: During patient's stay in rehab weekly team conferences were held to monitor patient's progress, set goals and discuss barriers to discharge. At admission, patient required min assist with basic self-care task and min assist with mobility.  She exhibited severe deficits in executive function as well as mild deficits in attention visuospatial tasks and memory which was further impacted by impulsivity. She  has had improvement in activity tolerance, balance, postural control as well as ability to compensate for deficits.  She requires supervision to complete ADL tasks.  She requires supervision for transfers and to ambulate short distances with rolling walker.  She declined practicing stairs for home entry as she felt that husband could mange that.  She requires supervision to min assist for high-level cognitive tasks.  Patient's husband was unavailable for family education but she felt that he could provide care that was needed.     Disposition: Home  Diet: Cardiac diet/Low salt.   Special Instructions:  1. 24-hour supervision is recommended at discharge 2.  Needs to wear CPAP whenever napping and at bedtime. Orders for BIPAP placed.  3. Will need steroids tapered after discharge. 4. Repeat BMET in one week.    Allergies as of 06/05/2018      Reactions   Other Nausea And Vomiting   Anti-depressent that starts with t      Medication List    STOP taking these medications   docusate sodium 100 MG capsule Commonly known as:  COLACE   roflumilast 500 MCG Tabs tablet Commonly known as:  DALIRESP    sevelamer carbonate 800 MG tablet Commonly known as:  RENVELA   traZODone 50 MG tablet Commonly known as:  DESYREL     TAKE these medications   acetaminophen 325 MG tablet Commonly known as:  TYLENOL Take 1-2 tablets (325-650 mg total) by mouth every 4 (four) hours as needed for mild pain. What changed:    medication strength  how much to take  when to take this  reasons to take this   albuterol 108 (90 Base) MCG/ACT inhaler Commonly known as:  VENTOLIN HFA Inhale into the lungs every 6 (six) hours as needed for wheezing or shortness of breath.   ALPRAZolam 0.25 MG tablet--Rx # 30 pills  Commonly known as:  XANAX Take 1 tablet (0.25 mg total) by mouth 3 (three) times daily. Notes to patient:  NOTE THAT THE DOSE HAS BEEN DECREASED. DO NOT USE WHAT YOU HAVE AT HOME.     amLODipine 10 MG tablet Commonly known as:  NORVASC Take 1 tablet (10 mg total) by mouth daily. What changed:    medication strength  how much to take   escitalopram 10 MG tablet Commonly known as:  LEXAPRO Take 1 tablet (10 mg total) by mouth daily. Notes to patient:  FOR DEPRESSION/ANXIETY   feeding supplement (NEPRO CARB STEADY) Liqd Take 237 mLs by mouth 3 (three) times daily between meals.   Fluticasone-Salmeterol 250-50 MCG/DOSE Aepb Commonly known as:  ADVAIR Inhale 1 puff into the lungs 2 (two) times daily.   lidocaine 5 % Commonly known as:  LIDODERM Apply to chest wall at 8 am and remove at 8 pm daily. Notes to patient:  YOU CAN PURCHASE THIS OVER THE COUNTER IF YOU INSURANCE DOES NOT COVER IT.    Melatonin 3 MG Tabs Take 1 tablet (3 mg total) by mouth at bedtime. Use in place of ambien What changed:    when to take this  reasons to take this  additional instructions   methocarbamol 500 MG tablet Commonly known as:  ROBAXIN Take 2 tablets (1,000 mg total) by mouth 3 (three) times daily.   multivitamin with minerals tablet Take 1 tablet by mouth daily.   pantoprazole 40  MG tablet Commonly known as:  PROTONIX Take 1 tablet (40 mg total) by mouth 2 (two) times daily.   polyethylene glycol 17 g packet Commonly known as:  MIRALAX / GLYCOLAX Take 17 g by mouth 2 (two) times daily. Notes to patient:  FOR CONSTIPATION   predniSONE 20 MG tablet Commonly known as:  DELTASONE Take 1 tablet (20 mg total) by mouth daily with breakfast. Start taking on:  Jun 06, 2018 What changed:    medication strength  how much to take  how to take this  when to take this  additional instructions Notes to patient:  TO HELP WITH RESPIRATORY STATUS.    QUEtiapine 25 MG tablet Commonly known as:  SEROQUEL  Take 1 tablet (25 mg total) by mouth at bedtime. Notes to patient:  TO HELP WITH SLEEP AND ANXIETY   tiotropium 18 MCG inhalation capsule Commonly known as:  SPIRIVA Place 18 mcg into inhaler and inhale daily.   traMADol 50 MG tablet--Rx # 20 pills Commonly known as:  ULTRAM Take 1 tablet (50 mg total) by mouth every 6 (six) hours as needed for severe pain.      Follow-up Information    Sinda Du, MD Follow up on 06/08/2018.   Specialty:  Pulmonary Disease Why:  @ 1 pm for hospital follow up.  Contact information: [address] [place] [address] [phone]        Jamse Arn, MD Follow up.   Specialty:  Physical Medicine and Rehabilitation Why:  Office will call you for virtual visit Contact information: [address] [place] [address] [phone]        Justin Mend, MD. Call today.   Specialty:  Internal Medicine Why:  for appointment if you  have not heard from them by  next week.  Contact information: [place]  [phone]           Signed: Bary Leriche 06/05/2018, 5:05 PM Patient seen and examined by me on day of discharge. Delice Lesch, MD, ABPMR

## 2018-06-05 NOTE — Discharge Instructions (Signed)
Inpatient Rehab Discharge Instructions  GLORIANNA GOTT Discharge date and time: 06/05/18   Activities/Precautions/ Functional Status: Activity: no lifting, driving, or strenuous exercise till cleared by MD Diet: cardiac diet Low salt Wound Care: none needed   Functional status:  ___ No restrictions     ___ Walk up steps independently _X__ 24/7 supervision/assistance   ___ Walk up steps with assistance ___ Intermittent supervision/assistance  ___ Bathe/dress independently ___ Walk with walker     ___ Bathe/dress with assistance ___ Walk Independently                ___ Shower independently ___ Walk with assistance    _X__ Shower with assistance _X__ No alcohol     ___ Return to work/school ________   Special Instructions: 1. No smoking 2. You have to use your CPAP when napping/at bedtime to help with your respiratory issues. It will help increase your oxygen levels and decrease your carbon dioxide levels.  3. Prednisone resumed to help with respiratory status. To be weaned on outpatient basis.   COMMUNITY REFERRALS UPON DISCHARGE:    Home Health:   PT     OT     ST    RN                               Agency:  [facility] Phone: [phone]   Medical Equipment/Items Ordered: portable Oxygen                                                               Agency/Supplier:  [facility] @ [phone]    My questions have been answered and I understand these instructions. I will adhere to these goals and the provided educational materials after my discharge from the hospital.  Patient/Caregiver Signature _______________________________ Date __________  Clinician Signature _______________________________________ Date __________  Please bring this form and your medication list with you to all your follow-up doctor's appointments.          Low Sodium Nutrition Therapy  Eating less sodium can help you if you have high blood pressure, heart failure, or kidney or  liver disease.   Your body needs a little sodium, but too much sodium can cause your body to hold onto extra water. This extra water will raise your blood pressure and can cause damage to your heart, kidneys, or liver as they are forced to work harder.   Sometimes you can see how the extra fluid affects you because your hands, legs, or belly swell. You may also hold water around your heart and lungs, which makes it hard to breathe.   Even if you take medication for blood pressure or a water pill (diuretic) to remove fluid, it is still important to have less salt in your diet.   Check with your primary care provider before drinking alcohol since it may affect the amount of fluid in your body and how your heart, kidneys, or liverwork.   Sodium in Food  A low-sodium meal plan limits the sodium that you get from food and beverages to1,500-2,000 milligrams (mg) per day. Salt is the main source of sodium. Read the nutrition label on the package to find out how much sodium is in  one serving of a food.   Select foods with 140 milligrams (mg) of sodium or less per serving.  You may be able to eat one or two servings of foods with a little more than 140 milligrams (mg) of sodium if you are closely watching how much sodium you eat in a day.  Check the serving size on the label. The amount of sodium listed on the label shows the amount in one serving of the food. So, if you eat more than one serving, you will get more sodium than the amount listed.  Cutting Back on Sodium   Eat more fresh foods.  ? Fresh fruits and vegetables are low in sodium, as well as frozen vegetables and fruits that have no added juices or sauces. ? Fresh meats are lower in sodium than processed meats, such as bacon, sausage, and hotdogs.  Not all processed foods are unhealthy, but some processed foods may have too much sodium.  Eat less salt at the table and when cooking. One of the ingredients in salt is sodium.   ? One teaspoon of table salt has 2,300 milligrams of sodium. ? Leave the salt out of recipes for pasta, casseroles, and soups.  Be a Paramedic. ? Food packages that say Salt-free, sodium-free, very low sodium, and low sodium have less than 140 milligrams of sodium per serving. ? Beware of products identified as Unsalted, No Salt Added, Reduced Sodium, or Lower Sodium. These items may still be high in sodium. You should always check the nutrition label.  Add flavors to your food without adding sodium. ? Try lemon juice, lime juice, or vinegar. ? Dry or fresh herbs add flavor. ? Buy a sodium-free seasoning blend or make your own at home.  You can purchase salt-free or sodium-free condiments like barbeque sauce in stores and online. Ask your registered dietitian nutritionist for recommendations and where to find them.  Eating in Restaurants  Choose foods carefully when you eat outside your home. Restaurant foods can be very high in sodium. Many restaurants provide nutrition facts on their menus or their websites. If you cannot find that information, ask your server. Let your server know that you want your food to be cooked without salt and that you would like your salad dressing and sauces to be served on the side.        Food Group Foods Recommended  Grains Bread, bagels, rolls without salted tops Homemade bread made with reduced-sodium baking powder Cold cereals, especially shredded wheat and puffed rice Oats, grits, or cream of wheat Pastas, quinoa, and rice Popcorn, pretzels or crackers without salt Corn tortillas  Protein Foods Fresh meats and fish; [place] bacon (check the nutrition labels - make sure they are not packaged in a sodium solution) Canned or packed tuna (no more than 4 ounces at 1 serving) Beans and peas Soybeans) and tofu Eggs Nuts or nut butters without salt  Dairy Milk or milk powder Plant milks, such as rice and soy Yogurt, including  Greek yogurt Small amounts of natural cheese (blocks of cheese) or reduced-sodium cheese can be used in moderation. (Swiss, ricotta, and fresh mozzarella cheese are lower in sodium than the others) Cream Cheese Low sodium cottage cheese  Vegetables Fresh and frozen vegetables without added sauces or salt Homemade soups (without salt) Low-sodium, salt-free or sodium-free canned vegetables and soups  Fruit Fresh and canned fruits Dried fruits, such as raisins, cranberries, and prunes  Oils Tub or liquid margarine, regular or without salt  Canola, corn, peanut, olive, safflower, or sunflower oils  Condiments Fresh or dried herbssuch as basil, bay leaf, dill, mustard (dry), nutmeg, paprika, parsley, rosemary, sage, or thyme.  Low sodium ketchup Vinegar Lemon or lime juice Pepper, red pepper flakes, and cayenne. Hot sauce contains sodium, but if you use just a drop or two, it will not add up to much.  Salt-free or sodium-free seasoning mixes and marinades Simple salad dressings: vinegar and oil        Food Group Foods Not Recommended  Grains Breads or crackers topped with salt Cereals (hot/cold) with more than 300 mg sodium per serving Biscuits, cornbread, and other quick breads prepared with baking soda Pre-packaged bread crumbs Seasoned and packaged rice and pasta mixes Self-rising flours  Protein Foods Cured meats: Bacon, ham, sausage, pepperoni and hot dogs Canned meats (chili, vienna sausage, or sardines) Smoked fish and meats Frozen meals that have more than 600 mg of sodium per serving Egg substitute (with added sodium)  Dairy Buttermilk Processed cheese spreads Cottage cheese (1 cup may have over 500 mg of sodium; look for low-sodium.) American or feta cheese Shredded Cheese has more sodium than blocks of cheese String cheese  Vegetables Canned vegetables (unless they are salt-free, sodium-free or low  sodium) Frozen vegetables with seasoning and sauces Sauerkraut and pickled vegetables Canned or dried soups (unless they are salt-free, sodium-free, or low sodium) [place] fries and onion rings  Fruit Dried fruits preserved with additives that have sodium  Oils Salted butter or margarine, all types of olives  Condiments Salt, sea salt, kosher salt, onion salt, and garlic salt Seasoning mixes with salt Bouillon cubes Ketchup Barbeque sauce and Worcestershire sauce unless low sodium Soy sauce Salsa, pickles, olives, relish Salad dressings: ranch, blue cheese, [place], and [place].    Low Sodium Sample 1-Day Menu Breakfast 1 cup cooked oatmeal  1 slice whole wheat bread toast 1 tablespoon peanut butter without salt  1 banana 1 cup 1% milk  Lunch Tacos made with: 2 corn tortillas   cup black beans, low sodium  cup roasted or grilled chicken (without skin)   avocado Squeeze of lime juice  1 cup salad greens  1 tablespoon low-sodium salad dressing   cup strawberries 1 orange  Afternoon Snack 1/3 cup grapes  6 ounces yogurt  Evening Meal 3 ounces herb-baked fish  1 baked potato 2 teaspoons olive oil   cup cooked carrots 2 thick slices tomatoes on: 2 lettuce leaves 1 teaspoon olive oil 1 teaspoon balsamic vinegar 1 cup 1% milk  Evening Snack 1 apple   cup almonds without salt

## 2018-06-07 ENCOUNTER — Telehealth: Payer: Self-pay | Admitting: *Deleted

## 2018-06-07 NOTE — Telephone Encounter (Signed)
Transitional care call completed, Appointment discussed, switched to [facility] virtual visit May 28th 11:40am.  Transitional Care Questions   Questions for our staff to ask patients on Transitional care 48 hour phone call:   1. Are you/is patient experiencing any problems since coming home? Patient still experiencing pain in her chest as a result of CPR being perfomed. Patient still feeling fatigued, lethargic, weak.Are there any questions regarding any aspect of care?  Patient states she is needing refills on some of her discharge medication  2. Are there any questions regarding medications administration/dosing? Needs refills Are meds being taken as prescribed? yes Patient should review meds with caller to confirm   3. Have there been any falls? No  4. Has Home Health been to the house and/or have they contacted you?Yes, nurse visit with therapy to follow If not, have you tried to contact them? Can we help you contact them?   5. Are bowels and bladder emptying properly? Yes Are there any unexpected incontinence issues?No If applicable, is patient following bowel/bladder programs?   6. Any fevers, problems with breathing, unexpected pain? No fever, patient on O2, uses sleep apnea device, mild SOB  7. Are there any skin problems or new areas of breakdown? No  8. Has the patient/family member arranged specialty MD follow up (ie cardiology/neurology/renal/surgical/etc)? Visit with Dr. Luan Pulling and Posey Pronto, not sure about Dr. Gerarda Gunther we help arrange? No  9. Does the patient need any other services or support that we can help arrange? No  10. Are caregivers following through as expected in assisting the patient? Yes  11. Has the patient quit smoking, drinking alcohol, or using drugs as recommended? No smoke, no drink, no drug

## 2018-06-08 ENCOUNTER — Emergency Department (HOSPITAL_COMMUNITY)
Admission: EM | Admit: 2018-06-08 | Discharge: 2018-06-09 | Disposition: A | Discharge status: Home / Self Care | Payer: 59 | Attending: Emergency Medicine | Admitting: Emergency Medicine

## 2018-06-08 ENCOUNTER — Emergency Department (HOSPITAL_COMMUNITY): Payer: 59

## 2018-06-08 ENCOUNTER — Encounter (HOSPITAL_COMMUNITY): Payer: Self-pay | Admitting: Emergency Medicine

## 2018-06-08 ENCOUNTER — Other Ambulatory Visit: Payer: Self-pay

## 2018-06-08 DIAGNOSIS — Z79899 Other long term (current) drug therapy: Secondary | ICD-10-CM | POA: Diagnosis not present

## 2018-06-08 DIAGNOSIS — J441 Chronic obstructive pulmonary disease with (acute) exacerbation: Secondary | ICD-10-CM | POA: Insufficient documentation

## 2018-06-08 DIAGNOSIS — Z20828 Contact with and (suspected) exposure to other viral communicable diseases: Secondary | ICD-10-CM | POA: Insufficient documentation

## 2018-06-08 DIAGNOSIS — Z8674 Personal history of sudden cardiac arrest: Secondary | ICD-10-CM | POA: Insufficient documentation

## 2018-06-08 DIAGNOSIS — I509 Heart failure, unspecified: Secondary | ICD-10-CM | POA: Diagnosis not present

## 2018-06-08 DIAGNOSIS — Z87891 Personal history of nicotine dependence: Secondary | ICD-10-CM | POA: Insufficient documentation

## 2018-06-08 DIAGNOSIS — R0789 Other chest pain: Secondary | ICD-10-CM

## 2018-06-08 DIAGNOSIS — R0602 Shortness of breath: Secondary | ICD-10-CM | POA: Diagnosis present

## 2018-06-08 MED ORDER — ALBUTEROL SULFATE HFA 108 (90 BASE) MCG/ACT IN AERS
8.0000 | INHALATION_SPRAY | Freq: Once | RESPIRATORY_TRACT | Status: AC
  Administered 2018-06-09: 8 via RESPIRATORY_TRACT
  Filled 2018-06-08: qty 6.7

## 2018-06-08 MED ORDER — AEROCHAMBER PLUS FLO-VU LARGE MISC: 1.0000 | Freq: Once | Status: DC

## 2018-06-08 MED ORDER — IPRATROPIUM BROMIDE HFA 17 MCG/ACT IN AERS
2.0000 | INHALATION_SPRAY | Freq: Once | RESPIRATORY_TRACT | Status: AC
  Administered 2018-06-09: 2 via RESPIRATORY_TRACT
  Filled 2018-06-08: qty 12.9

## 2018-06-08 MED ORDER — METHYLPREDNISOLONE SODIUM SUCC 125 MG IJ SOLR
125.0000 mg | Freq: Once | INTRAMUSCULAR | Status: AC
  Administered 2018-06-09: 125 mg via INTRAVENOUS
  Filled 2018-06-08: qty 2

## 2018-06-08 NOTE — ED Provider Notes (Signed)
TIME SEEN: 11:36 PM  CHIEF COMPLAINT: Chest pain, shortness of breath  HPI: Patient is a 48 year old female with history of opiate abuse, COPD who presents to the emergency department with complaints of chest pain and shortness of breath.  States she has been wheezing.  States that she was here in the end of April after she had a cardiac arrest.  States she has had chest pain since receiving CPR and this is unchanged but persistent.  She also reports shortness of breath for the past several days progressively worsening.  She wears oxygen at home since discharge from the hospital.  She states she has had a nonproductive cough and chills but no fever.  States she feels her legs are swollen.  Echocardiogram March 2018 and April 2020 showed EF of 60 to 65% with no wall motion abnormalities.  ROS: See HPI Constitutional: no fever  Eyes: no drainage  ENT: no runny nose   Cardiovascular:  chest pain  Resp: SOB  GI: no vomiting GU: no dysuria Integumentary: no rash  Allergy: no hives  Musculoskeletal: no leg swelling  Neurological: no slurred speech ROS otherwise negative  PAST MEDICAL HISTORY/PAST SURGICAL HISTORY:  Past Medical History:  Diagnosis Date  . CHF (congestive heart failure) ([place])   . COPD (chronic obstructive pulmonary disease) ([place])   . Current smoker   . Opiate abuse, continuous ([facility])     MEDICATIONS:  Prior to Admission medications   Medication Sig Start Date End Date Taking? Authorizing Provider  acetaminophen (TYLENOL) 325 MG tablet Take 1-2 tablets (325-650 mg total) by mouth every 4 (four) hours as needed for mild pain. 06/05/18   Love, Ivan Anchors, PA-C  albuterol (PROVENTIL HFA;VENTOLIN HFA) 108 (90 Base) MCG/ACT inhaler Inhale into the lungs every 6 (six) hours as needed for wheezing or shortness of breath.    [provider]  ALPRAZolam Duanne Moron) 0.25 MG tablet Take 1 tablet (0.25 mg total) by mouth 3 (three) times daily. 06/05/18   Love, Ivan Anchors, PA-C   amLODipine (NORVASC) 10 MG tablet Take 1 tablet (10 mg total) by mouth daily. 06/05/18   Love, Ivan Anchors, PA-C  escitalopram (LEXAPRO) 10 MG tablet Take 1 tablet (10 mg total) by mouth daily. 06/05/18   Love, Ivan Anchors, PA-C  Fluticasone-Salmeterol (ADVAIR) 250-50 MCG/DOSE AEPB Inhale 1 puff into the lungs 2 (two) times daily.    [provider]  lidocaine (LIDODERM) 5 % Apply to chest wall at 8 am and remove at 8 pm daily. 06/05/18   Love, Ivan Anchors, PA-C  Melatonin 3 MG TABS Take 1 tablet (3 mg total) by mouth at bedtime. Use in place of ambien 06/05/18   Love, Ivan Anchors, PA-C  methocarbamol (ROBAXIN) 500 MG tablet Take 2 tablets (1,000 mg total) by mouth 3 (three) times daily. 06/05/18   Love, Ivan Anchors, PA-C  Multiple Vitamins-Minerals (MULTIVITAMIN WITH MINERALS) tablet Take 1 tablet by mouth daily.    [provider]  Nutritional Supplements (FEEDING SUPPLEMENT, NEPRO CARB STEADY,) LIQD Take 237 mLs by mouth 3 (three) times daily between meals. 05/27/18   Danford, Suann Larry, MD  pantoprazole (PROTONIX) 40 MG tablet Take 1 tablet (40 mg total) by mouth 2 (two) times daily. 06/05/18   Love, Ivan Anchors, PA-C  polyethylene glycol (MIRALAX / GLYCOLAX) 17 g packet Take 17 g by mouth 2 (two) times daily. 06/05/18   Love, Ivan Anchors, PA-C  predniSONE (DELTASONE) 20 MG tablet Take 1 tablet (20 mg total) by mouth daily  with breakfast. 06/06/18   Love, Ivan Anchors, PA-C  QUEtiapine (SEROQUEL) 25 MG tablet Take 1 tablet (25 mg total) by mouth at bedtime. 06/05/18   Love, Ivan Anchors, PA-C  tiotropium (SPIRIVA) 18 MCG inhalation capsule Place 18 mcg into inhaler and inhale daily.    [provider]  traMADol (ULTRAM) 50 MG tablet Take 1 tablet (50 mg total) by mouth every 6 (six) hours as needed for severe pain. 06/05/18   Bary Leriche, PA-C    ALLERGIES:  Allergies  Allergen Reactions  . Other Nausea And Vomiting    Anti-depressent that starts with t    SOCIAL HISTORY:  Social History    Tobacco Use  . Smoking status: Former Smoker    Packs/day: 1.00    Years: 15.00    Pack years: 15.00    Types: Cigarettes  . Smokeless tobacco: Never Used  Substance Use Topics  . Alcohol use: No    FAMILY HISTORY: Family History  Problem Relation Age of Onset  . COPD Mother   . Hypertension Father   . Diabetes Father     EXAM: BP (!) 155/93 (BP Location: Right Arm)   Pulse 76   Temp 98.3 F (36.8 C) (Oral)   Resp 20   SpO2 99%  CONSTITUTIONAL: Alert and oriented and responds appropriately to questions.  Chronically ill-appearing HEAD: Normocephalic EYES: Conjunctivae clear, pupils appear equal, EOMI ENT: normal nose; moist mucous membranes NECK: Supple, no meningismus, no nuchal rigidity, no LAD  CARD: RRR; S1 and S2 appreciated; no murmurs, no clicks, no rubs, no gallops CHEST:  Chest wall is tender to palpation over the center of the chest which reproduces her pain.  No crepitus, ecchymosis, erythema, warmth, rash or other lesions present.   RESP: Normal chest excursion without splinting or tachypnea; speaking in short sentences, no hypoxia on oxygen by nasal cannula at rest, diminished aeration diffusely, no rhonchi or rales or wheezing ABD/GI: Normal bowel sounds; non-distended; soft, non-tender, no rebound, no guarding, no peritoneal signs, no hepatosplenomegaly BACK:  The back appears normal and is non-tender to palpation, there is no CVA tenderness EXT: Normal ROM in all joints; non-tender to palpation; no edema; normal capillary refill; no cyanosis, no calf tenderness or swelling    SKIN: Normal color for age and race; warm; no rash NEURO: Moves all extremities equally PSYCH: The patient's mood and manner are appropriate. Grooming and personal hygiene are appropriate.  MEDICAL DECISION MAKING: Patient here with shortness of breath and wheezing at home.  Suspect COPD exacerbation.  She is tight with diminished aeration and is speaking short sentences.  She does  report chills and nonproductive cough.  Will obtain coronavirus testing.  Will give albuterol and Atrovent by inhaler with a spacer.  Will give Solu-Medrol.  Previous echocardiograms did not suggest CHF and she does not appear to be volume overloaded today.  Her chest pain seems musculoskeletal in nature after CPR a month ago.  She has a history of opiate abuse, will avoid narcotics in the ED.  Will obtain labs including troponin although my suspicion for ACS is low.  PE is also on the differential.  Will obtain chest x-ray to evaluate for pneumonia.  Anticipate admission.  ED PROGRESS: Patient's work-up has been unremarkable.  Her blood gas is reassuring and she is compensated.  Troponin is negative but I suspect that her pain is musculoskeletal in nature and she agrees.  I do not feel she needs a second set of cardiac  enzymes given pain is been ongoing for over a month.  She was given Toradol for pain in the ED.  Her chest x-ray shows no acute abnormality and her COVID swab is negative.  She has received albuterol and Atrovent through an inhaler.  Now that her COVID swab is negative, will give albuterol nebulizer treatment.  She is now able to speak full sentences and feels better and feels like she would like to try to go home.  Will reassess patient after nebulizer treatment and ambulate on 3 L oxygen with pulse oximetry.  Patient comfortable with this plan.  2:40 AM  Pt reports feeling much better.  She is speaking full sentences and able to ambulate without hypoxia or increased work of breathing.  Lungs are clear to auscultation with better aeration.  She will be discharged home with albuterol and Atrovent healers provided today and discharged on a prednisone burst.  She is comfortable with this plan.  Discussed return precautions.   At this time, I do not feel there is any life-threatening condition present. I have reviewed and discussed all results (EKG, imaging, lab, urine as appropriate) and exam  findings with patient/family. I have reviewed nursing notes and appropriate previous records.  I feel the patient is safe to be discharged home without further emergent workup and can continue workup as an outpatient as needed. Discussed usual and customary return precautions. Patient/family verbalize understanding and are comfortable with this plan.  Outpatient follow-up has been provided as needed. All questions have been answered.    EKG Interpretation  Date/Time:  Thursday Jun 08 2018 23:17:44 EDT Ventricular Rate:  82 PR Interval:    QRS Duration: 76 QT Interval:  358 QTC Calculation: 419 R Axis:   74 Text Interpretation:  Sinus rhythm Consider right atrial enlargement No significant change since last tracing Confirmed by , Cyril Mourning [phone]) on 06/08/2018 11:18:58 PM         , Delice Bison, DO 06/09/18 8101

## 2018-06-08 NOTE — ED Triage Notes (Signed)
Pt BIB [facility] from home, c/o increased shortness of breath x 2 days. Pt on 3L [place] normally, at rest SpO2 98% [place], walking SpO2 84% on [place], unable to ambulate more than 68ft. Hx post-CPR x 1 month ago, c/o central chest pain since discharge from hospital. EMS VSS.

## 2018-06-09 LAB — POCT I-STAT 7, (LYTES, BLD GAS, ICA,H+H)
Acid-Base Excess: 16 mmol/L — ABNORMAL HIGH (ref 0.0–2.0)
Bicarbonate: 43.1 mmol/L — ABNORMAL HIGH (ref 20.0–28.0)
Calcium, Ion: 1.23 mmol/L (ref 1.15–1.40)
HCT: 31 % — ABNORMAL LOW (ref 36.0–46.0)
Hemoglobin: 10.5 g/dL — ABNORMAL LOW (ref 12.0–15.0)
O2 Saturation: 97 %
Patient temperature: 98.6
Potassium: 3.7 mmol/L (ref 3.5–5.1)
Sodium: 138 mmol/L (ref 135–145)
TCO2: 45 mmol/L — ABNORMAL HIGH (ref 22–32)
pCO2 arterial: 67 mmHg (ref 32.0–48.0)
pH, Arterial: 7.416 (ref 7.350–7.450)
pO2, Arterial: 91 mmHg (ref 83.0–108.0)

## 2018-06-09 LAB — CBC WITH DIFFERENTIAL/PLATELET
Abs Immature Granulocytes: 0.16 10*3/uL — ABNORMAL HIGH (ref 0.00–0.07)
Basophils Absolute: 0 10*3/uL (ref 0.0–0.1)
Basophils Relative: 0 %
Eosinophils Absolute: 0 10*3/uL (ref 0.0–0.5)
Eosinophils Relative: 0 %
HCT: 33.7 % — ABNORMAL LOW (ref 36.0–46.0)
Hemoglobin: 10.1 g/dL — ABNORMAL LOW (ref 12.0–15.0)
Immature Granulocytes: 2 %
Lymphocytes Relative: 23 %
Lymphs Abs: 2.1 10*3/uL (ref 0.7–4.0)
MCH: 30.1 pg (ref 26.0–34.0)
MCHC: 30 g/dL (ref 30.0–36.0)
MCV: 100.3 fL — ABNORMAL HIGH (ref 80.0–100.0)
Monocytes Absolute: 1.1 10*3/uL — ABNORMAL HIGH (ref 0.1–1.0)
Monocytes Relative: 12 %
Neutro Abs: 5.8 10*3/uL (ref 1.7–7.7)
Neutrophils Relative %: 63 %
Platelets: 305 10*3/uL (ref 150–400)
RBC: 3.36 MIL/uL — ABNORMAL LOW (ref 3.87–5.11)
RDW: 14.6 % (ref 11.5–15.5)
WBC: 9.2 10*3/uL (ref 4.0–10.5)
nRBC: 0.9 % — ABNORMAL HIGH (ref 0.0–0.2)

## 2018-06-09 LAB — BASIC METABOLIC PANEL
Anion gap: 15 (ref 5–15)
BUN: 16 mg/dL (ref 6–20)
CO2: 35 mmol/L — ABNORMAL HIGH (ref 22–32)
Calcium: 9.5 mg/dL (ref 8.9–10.3)
Chloride: 92 mmol/L — ABNORMAL LOW (ref 98–111)
Creatinine, Ser: 0.95 mg/dL (ref 0.44–1.00)
GFR calc Af Amer: >60 mL/min (ref >60)
GFR calc non Af Amer: >60 mL/min (ref >60)
Glucose, Bld: 89 mg/dL (ref 70–99)
Potassium: 3.8 mmol/L (ref 3.5–5.1)
Sodium: 142 mmol/L (ref 135–145)

## 2018-06-09 LAB — TROPONIN I: Troponin I: <0.03 ng/mL (ref <0.03)

## 2018-06-09 LAB — SARS CORONAVIRUS 2 BY RT PCR (HOSPITAL ORDER, PERFORMED IN ~~LOC~~ HOSPITAL LAB): SARS Coronavirus 2: NEGATIVE

## 2018-06-09 MED ORDER — IPRATROPIUM BROMIDE HFA 17 MCG/ACT IN AERS
2.0000 | INHALATION_SPRAY | Freq: Once | RESPIRATORY_TRACT | Status: AC
  Administered 2018-06-09: 2 via RESPIRATORY_TRACT
  Filled 2018-06-09: qty 12.9

## 2018-06-09 MED ORDER — ALBUTEROL SULFATE (2.5 MG/3ML) 0.083% IN NEBU
5.0000 mg | INHALATION_SOLUTION | Freq: Once | RESPIRATORY_TRACT | Status: AC
  Administered 2018-06-09: 5 mg via RESPIRATORY_TRACT
  Filled 2018-06-09: qty 6

## 2018-06-09 MED ORDER — AEROCHAMBER PLUS FLO-VU LARGE MISC
Status: AC
  Administered 2018-06-09: 01:00:00
  Filled 2018-06-09: qty 1

## 2018-06-09 MED ORDER — ALBUTEROL SULFATE HFA 108 (90 BASE) MCG/ACT IN AERS
8.0000 | INHALATION_SPRAY | Freq: Once | RESPIRATORY_TRACT | Status: AC
  Administered 2018-06-09: 01:00:00 8 via RESPIRATORY_TRACT

## 2018-06-09 MED ORDER — PREDNISONE 20 MG PO TABS: 60.0000 mg | ORAL_TABLET | Freq: Every day | ORAL | 0 refills | Status: DC

## 2018-06-09 MED ORDER — KETOROLAC TROMETHAMINE 30 MG/ML IJ SOLN
30.0000 mg | Freq: Once | INTRAMUSCULAR | Status: AC
  Administered 2018-06-09: 01:00:00 30 mg via INTRAVENOUS
  Filled 2018-06-09: qty 1

## 2018-06-09 NOTE — Progress Notes (Signed)
Arterial blood gas drawn on 2lpm, results given to Dr.Ward.

## 2018-06-09 NOTE — Discharge Instructions (Signed)
You may use your albuterol inhaler 2 to 4 puffs every 2-4 hours as needed for shortness of breath and wheezing.  You may use your Atrovent inhaler 2 puffs every 8 hours as needed for shortness of breath and wheezing.

## 2018-06-09 NOTE — ED Notes (Signed)
All appropriate discharge materials reviewed with patient at length. Time for questions provided. Pt denies any further questions at this time. Verbalizes understanding of all provided materials.  

## 2018-06-09 NOTE — ED Notes (Signed)
Pt ambulated with home O2 2L [place] and maintained saturations of 92%

## 2018-06-15 ENCOUNTER — Other Ambulatory Visit: Payer: Self-pay

## 2018-06-15 ENCOUNTER — Encounter: Payer: 59 | Attending: Physical Medicine & Rehabilitation | Admitting: Physical Medicine & Rehabilitation

## 2018-06-15 ENCOUNTER — Encounter: Payer: Self-pay | Admitting: Physical Medicine & Rehabilitation

## 2018-06-15 VITALS — BP 126/88 | Ht 63.0 in | Wt 180.0 lb

## 2018-06-15 DIAGNOSIS — G4733 Obstructive sleep apnea (adult) (pediatric): Secondary | ICD-10-CM

## 2018-06-15 DIAGNOSIS — Z9981 Dependence on supplemental oxygen: Secondary | ICD-10-CM

## 2018-06-15 DIAGNOSIS — N179 Acute kidney failure, unspecified: Secondary | ICD-10-CM

## 2018-06-15 DIAGNOSIS — Z9989 Dependence on other enabling machines and devices: Secondary | ICD-10-CM | POA: Diagnosis not present

## 2018-06-15 DIAGNOSIS — F411 Generalized anxiety disorder: Secondary | ICD-10-CM

## 2018-06-15 DIAGNOSIS — J449 Chronic obstructive pulmonary disease, unspecified: Secondary | ICD-10-CM

## 2018-06-15 DIAGNOSIS — R5381 Other malaise: Secondary | ICD-10-CM

## 2018-06-15 NOTE — Progress Notes (Signed)
Subjective:    Patient ID: Misty Green, female    DOB: August 08, 1970, 48 y.o.   MRN: [id number]  TELEHEALTH NOTE  Due to national recommendations of social distancing due to COVID 19, an audio/video telehealth visit is felt to be most appropriate for this patient at this time.  See Chart message from today for the patient's consent to telehealth from [place].     I verified that I am speaking with the correct person using two identifiers.  Location of patient: Home Location of provider: Office Method of communication: Webex, transitioned to telephone due to technical issues Names of participants : Zorita Pang scheduling, Marland Mcalpine obtaining consent and vitals if available Established patient Time spent on call: 26 minutes  HPI Female with history of COPD, OSA-3L oxygen HS, CHF presents for transitional care management after receiving CIR for encephalopathy and debility status post cardiac arrest with acute on chronic respiratory failure.  Since discharge, she returned to the ED for shortness of breath, notes reviewed.  She states, she has supervision. She states she has been wearing her CPAP.   She does not have a BiPAP yet. She is on steroids from ED. She had labs, includin ABG drawn at hospital.  She had a visit with PCP and was given medications for sleep and pain.  She has not followed up with Dr. Johnney Ou. Denies falls.  Therapies: 2/week. DME: Not required Mobility: No device required.  Pain Inventory Average Pain 5 Pain Right Now 5 My pain is constant, dull and aching  In the last 24 hours, has pain interfered with the following? General activity 5 Relation with others 5 Enjoyment of life 4 What TIME of day is your pain at its worst? night Sleep (in general) Poor  Pain is worse with: inactivity and some activites Pain improves with: medication and sitting up Relief from Meds: 3  Mobility walk without assistance ability  to climb steps?  yes do you drive?  no  Function not employed: date last employed .  Neuro/Psych numbness tingling anxiety  Prior Studies Any changes since last visit?  no  Physicians involved in your care Any changes since last visit?  no   Family History  Problem Relation Age of Onset  . COPD Mother   . Hypertension Father   . Diabetes Father    Social History   Socioeconomic History  . Marital status: Married    Spouse name: Not on file  . Number of children: Not on file  . Years of education: Not on file  . Highest education level: Not on file  Occupational History  . Not on file  Social Needs  . Financial resource strain: Not on file  . Food insecurity:    Worry: Not on file    Inability: Not on file  . Transportation needs:    Medical: Not on file    Non-medical: Not on file  Tobacco Use  . Smoking status: Former Smoker    Packs/day: 1.00    Years: 15.00    Pack years: 15.00    Types: Cigarettes  . Smokeless tobacco: Never Used  Substance and Sexual Activity  . Alcohol use: No  . Drug use: Yes    Types: Marijuana    Comment: Yesterday  . Sexual activity: Not on file  Lifestyle  . Physical activity:    Days per week: Not on file    Minutes per session: Not on file  . Stress:  Not on file  Relationships  . Social connections:    Talks on phone: Not on file    Gets together: Not on file    Attends religious service: Not on file    Active member of club or organization: Not on file    Attends meetings of clubs or organizations: Not on file    Relationship status: Not on file  Other Topics Concern  . Not on file  Social History Narrative  . Not on file   Past Surgical History:  Procedure Laterality Date  . ABDOMINAL HYSTERECTOMY    . BACK SURGERY    . cesction    . COLOSTOMY  01/13/2018   Procedure: COLOSTOMY Creation;  Surgeon: Jules Husbands, MD;  Location: [facility] ORS;  Service: General;;  . INCISIONAL HERNIA REPAIR     lower midline  laparotomy incision (hysterectomy), repaired with mesh  . IR FLUORO GUIDE CV LINE RIGHT  05/26/2018  . IR REMOVAL TUN CV CATH W/O FL  06/02/2018  . IR US GUIDE VASC ACCESS RIGHT  05/26/2018  . LAPAROSCOPIC CHOLECYSTECTOMY  2012  . LAPAROSCOPIC LYSIS OF ADHESIONS  01/13/2018   Procedure: LAPAROSCOPIC LYSIS OF ADHESIONS;  Surgeon: Jules Husbands, MD;  Location: [facility] ORS;  Service: General;;  . LAPAROSCOPIC SIGMOID COLECTOMY  01/13/2018   Procedure: LAPAROSCOPIC SIGMOID COLECTOMY;  Surgeon: Jules Husbands, MD;  Location: [facility] ORS;  Service: General;;  . ORIF WRIST FRACTURE Right 08/25/2015   Procedure: OPEN REDUCTION INTERNAL FIXATION (ORIF) WRIST FRACTURE;  Surgeon: Corky Mull, MD;  Location: [facility] ORS;  Service: Orthopedics;  Laterality: Right;   Past Medical History:  Diagnosis Date  . CHF (congestive heart failure) ([place])   . COPD (chronic obstructive pulmonary disease) ([place])   . Current smoker   . Opiate abuse, continuous (HCC)    BP 126/88   Ht 5\' 3"  (1.6 m)   Wt 180 lb (81.6 kg)   BMI 31.89 kg/m   Opioid Risk Score:   Fall Risk Score:  `1  Depression screen PHQ 2/9  No flowsheet data found.'  Review of Systems  Constitutional: Negative.   HENT: Negative.   Eyes: Negative.   Respiratory: Positive for cough.   Cardiovascular: Negative.   Gastrointestinal: Negative.   Endocrine: Negative.   Genitourinary: Negative.   Musculoskeletal: Negative.   Skin: Negative.   Allergic/Immunologic: Negative.   Neurological: Positive for numbness.  Hematological: Negative.   Psychiatric/Behavioral: Negative.   All other systems reviewed and are negative.      Objective:   Physical Exam Gen: NAD. Pulm: Effort normal Neuro: Alert and oriented    Assessment & Plan:  Female with history of COPD, OSA-3L oxygen HS, CHF presents for transitional care management after receiving CIR for encephalopathy and debility status post cardiac arrest with acute on chronic respiratory failure.  1.    Debility secondary to cardiac arrest from acute on chronic respiratory failure.   Cont therapies  Cont follow up with Pulm  2. Pain Management:   Follow up with PCP  3. COPD with acute on chronic respiratory failure/OSA:    Cont supplemental oxygen  Follow up with Pulm  Steroid taper             Cont CPAP  Awaiting BiPAP  4. Acute renal failure/HD dependent:   Resolved  Needs Nephro appointment  Meds reviewed Referrals reviewed - needs appointment All questions answered

## 2018-06-19 ENCOUNTER — Other Ambulatory Visit: Payer: Self-pay | Admitting: Physical Medicine and Rehabilitation

## 2018-06-19 ENCOUNTER — Encounter: Payer: Self-pay | Admitting: *Deleted

## 2018-06-27 ENCOUNTER — Other Ambulatory Visit: Payer: Self-pay | Admitting: Physical Medicine and Rehabilitation

## 2018-07-04 ENCOUNTER — Telehealth: Payer: Self-pay | Admitting: *Deleted

## 2018-07-04 NOTE — Telephone Encounter (Signed)
Prior auth submitted to [facility] via [place] [facility] for Lidocaine 5% patches. [place] Key: [id number] - [place] Case ID: [id number] - Rx #: [id number]

## 2018-07-23 ENCOUNTER — Other Ambulatory Visit: Payer: Self-pay | Admitting: Physical Medicine and Rehabilitation

## 2018-07-27 ENCOUNTER — Encounter: Payer: 59 | Attending: Physical Medicine & Rehabilitation | Admitting: Physical Medicine & Rehabilitation

## 2018-08-02 ENCOUNTER — Other Ambulatory Visit: Payer: Self-pay | Admitting: Physical Medicine and Rehabilitation

## 2018-09-05 ENCOUNTER — Other Ambulatory Visit: Payer: Self-pay | Admitting: Physical Medicine and Rehabilitation

## 2018-09-05 NOTE — Telephone Encounter (Signed)
Please advise on refill, no showed appointment on 07/27/2018

## 2018-09-07 ENCOUNTER — Telehealth: Payer: Self-pay | Admitting: Surgery

## 2018-09-07 NOTE — Telephone Encounter (Signed)
Telephone Triage Questions    Date of surgery? 01/13/2018 Physician?     Dr. [name]   Patient is calling to find out when she was suppose to have her coloscopy bag removed she thought she was suppose to see Dr. [name] in August, I saw the patient is to follow up in September under recalls. Please call patient and advise.

## 2018-09-07 NOTE — Telephone Encounter (Signed)
Spoke with Shon Baton Gastroenterologist- patient has not had colonoscopy.   Appointment 09/13/2018 @ 1:30 at [place] with patient and appointment was verbalized.

## 2018-09-14 ENCOUNTER — Telehealth: Payer: Self-pay

## 2018-09-14 NOTE — Telephone Encounter (Signed)
Called patient in regards to her appointment with [facility] clinic gastroenterology 09/13/18, consult for Colonoscopy. She stated she did not have transportation and was instructed to call and reschedule the appointment and to call office once appointment has been made.

## 2018-12-20 ENCOUNTER — Telehealth: Payer: Self-pay

## 2018-12-20 NOTE — Telephone Encounter (Signed)
Call to patient to see if she has had her colonoscopy at [facility] completed yet. She states that she missed her last appointment on 11/20/18. She will be calling them to reschedule and will call us once she is scheduled for her colonoscopy. She will need a follow up with Dr [name] once she has this completed.

## 2019-02-23 ENCOUNTER — Telehealth: Payer: Self-pay

## 2019-02-23 NOTE — Telephone Encounter (Signed)
Called lmom informing patient of appointment on 02/27/2019. klh

## 2019-02-27 ENCOUNTER — Other Ambulatory Visit: Payer: Self-pay

## 2019-02-27 ENCOUNTER — Ambulatory Visit: Payer: 59 | Admitting: Internal Medicine

## 2019-02-27 ENCOUNTER — Encounter: Payer: Self-pay | Admitting: Internal Medicine

## 2019-02-27 ENCOUNTER — Encounter (INDEPENDENT_AMBULATORY_CARE_PROVIDER_SITE_OTHER): Payer: Self-pay

## 2019-02-27 VITALS — BP 133/72 | HR 79 | Temp 98.2°F | Resp 16 | Ht 63.0 in | Wt 201.2 lb

## 2019-02-27 DIAGNOSIS — G4733 Obstructive sleep apnea (adult) (pediatric): Secondary | ICD-10-CM

## 2019-02-27 DIAGNOSIS — J449 Chronic obstructive pulmonary disease, unspecified: Secondary | ICD-10-CM

## 2019-02-27 DIAGNOSIS — I10 Essential (primary) hypertension: Secondary | ICD-10-CM

## 2019-02-27 DIAGNOSIS — Z9981 Dependence on supplemental oxygen: Secondary | ICD-10-CM

## 2019-02-27 DIAGNOSIS — F068 Other specified mental disorders due to known physiological condition: Secondary | ICD-10-CM

## 2019-02-27 DIAGNOSIS — J9611 Chronic respiratory failure with hypoxia: Secondary | ICD-10-CM

## 2019-02-27 DIAGNOSIS — Z6835 Body mass index (BMI) 35.0-35.9, adult: Secondary | ICD-10-CM

## 2019-02-27 MED ORDER — ALPRAZOLAM 0.25 MG PO TABS: 0.2500 mg | ORAL_TABLET | Freq: Three times a day (TID) | ORAL | 0 refills | Status: DC

## 2019-02-27 NOTE — Progress Notes (Signed)
[facility] [place], [place] [address]  Pulmonary Sleep Medicine   Office Visit Note  Patient Name: Misty Green DOB: [birth year] MRN [id number]  Date of Service: 02/27/2019  Complaints/HPI: Pt is here to reestablish care. She is here because her previous PCP/pulmonolgist retired and she would like to come back to [place]. She has an extensive history including COPD, HTN, OSA, chronic respiratory failure, oxygen dependance  Overall she has been doing fair.  She is on 4 LPM of oxygen during the day.  She uses a BiPAP machine at night, with oxygen bleeding in. She reports good compliance. She denies any recent issues. She does have some anxiety that she currently takes xanax for, with good results.     ROS  General: (-) fever, (-) chills, (-) night sweats, (-) weakness Skin: (-) rashes, (-) itching,. Eyes: (-) visual changes, (-) redness, (-) itching. Nose and Sinuses: (-) nasal stuffiness or itchiness, (-) postnasal drip, (-) nosebleeds, (-) sinus trouble. Mouth and Throat: (-) sore throat, (-) hoarseness. Neck: (-) swollen glands, (-) enlarged thyroid, (-) neck pain. Respiratory: - cough, (-) bloody sputum, - shortness of breath, - wheezing. Cardiovascular: - ankle swelling, (-) chest pain. Lymphatic: (-) lymph node enlargement. Neurologic: (-) numbness, (-) tingling. Psychiatric: (-) anxiety, (-) depression   Current Medication: Outpatient Encounter Medications as of 02/27/2019  Medication Sig  . acetaminophen (TYLENOL) 325 MG tablet Take 1-2 tablets (325-650 mg total) by mouth every 4 (four) hours as needed for mild pain.  Marland Kitchen albuterol (PROVENTIL HFA;VENTOLIN HFA) 108 (90 Base) MCG/ACT inhaler Inhale into the lungs every 6 (six) hours as needed for wheezing or shortness of breath.  Marland Kitchen albuterol (PROVENTIL) (2.5 MG/3ML) 0.083% nebulizer solution Take 2.5 mg by nebulization. Inhale one vial via nebulizer 4 times a day as directed.  Marland Kitchen ALPRAZolam (XANAX) 0.25 MG  tablet Take 1 tablet (0.25 mg total) by mouth 3 (three) times daily.  Marland Kitchen amLODipine (NORVASC) 10 MG tablet TAKE 1 TABLET BY MOUTH EVERY DAY  . [facility] MELATONIN 3 MG TABS TAKE 1 TABLET (3 MG TOTAL) BY MOUTH AT BEDTIME. USE IN PLACE OF AMBIEN  . escitalopram (LEXAPRO) 10 MG tablet Take 1 tablet (10 mg total) by mouth daily.  . Fluticasone-Salmeterol (ADVAIR) 250-50 MCG/DOSE AEPB Inhale 1 puff into the lungs 2 (two) times daily.  . furosemide (LASIX) 20 MG tablet Take 20 mg by mouth daily.  Marland Kitchen gabapentin (NEURONTIN) 800 MG tablet Take 800 mg by mouth 3 (three) times daily.  . methocarbamol (ROBAXIN) 500 MG tablet Take 2 tablets (1,000 mg total) by mouth 3 (three) times daily.  . Multiple Vitamins-Minerals (MULTIVITAMIN WITH MINERALS) tablet Take 1 tablet by mouth daily.  . pantoprazole (PROTONIX) 40 MG tablet TAKE 1 TABLET BY MOUTH TWICE A DAY  . polyethylene glycol (MIRALAX / GLYCOLAX) 17 g packet Take 17 g by mouth 2 (two) times daily.  . QUEtiapine (SEROQUEL) 50 MG tablet Take 50 mg by mouth at bedtime.  . roflumilast (DALIRESP) 500 MCG TABS tablet Take 500 mcg by mouth daily.  Marland Kitchen tiotropium (SPIRIVA) 18 MCG inhalation capsule Place 18 mcg into inhaler and inhale daily.  . [DISCONTINUED] amLODipine (NORVASC) 10 MG tablet Take 1 tablet (10 mg total) by mouth daily.  . [DISCONTINUED] lidocaine (LIDODERM) 5 % Apply to chest wall at 8 am and remove at 8 pm daily. (Patient not taking: Reported on 02/27/2019)  . [DISCONTINUED] Melatonin 3 MG TABS Take 1 tablet (3 mg total) by mouth at  bedtime. Use in place of ambien  . [DISCONTINUED] Nutritional Supplements (FEEDING SUPPLEMENT, NEPRO CARB STEADY,) LIQD Take 237 mLs by mouth 3 (three) times daily between meals. (Patient not taking: Reported on 02/27/2019)  . [DISCONTINUED] pantoprazole (PROTONIX) 40 MG tablet Take 1 tablet (40 mg total) by mouth 2 (two) times daily.  . [DISCONTINUED] predniSONE (DELTASONE) 20 MG tablet Take 3 tablets (60 mg total) by mouth daily.  (Patient not taking: Reported on 02/27/2019)  . [DISCONTINUED] QUEtiapine (SEROQUEL) 25 MG tablet Take 1 tablet (25 mg total) by mouth at bedtime. (Patient not taking: Reported on 02/27/2019)  . [DISCONTINUED] traMADol (ULTRAM) 50 MG tablet Take 1 tablet (50 mg total) by mouth every 6 (six) hours as needed for severe pain. (Patient not taking: Reported on 02/27/2019)   No facility-administered encounter medications on file as of 02/27/2019.    Surgical History: Past Surgical History:  Procedure Laterality Date  . ABDOMINAL HYSTERECTOMY    . BACK SURGERY    . cesction    . COLOSTOMY  01/13/2018   Procedure: COLOSTOMY Creation;  Surgeon: Jules Husbands, MD;  Location: [facility] ORS;  Service: General;;  . INCISIONAL HERNIA REPAIR     lower midline laparotomy incision (hysterectomy), repaired with mesh  . IR FLUORO GUIDE CV LINE RIGHT  05/26/2018  . IR REMOVAL TUN CV CATH W/O FL  06/02/2018  . IR US GUIDE VASC ACCESS RIGHT  05/26/2018  . LAPAROSCOPIC CHOLECYSTECTOMY  2012  . LAPAROSCOPIC LYSIS OF ADHESIONS  01/13/2018   Procedure: LAPAROSCOPIC LYSIS OF ADHESIONS;  Surgeon: Jules Husbands, MD;  Location: [facility] ORS;  Service: General;;  . LAPAROSCOPIC SIGMOID COLECTOMY  01/13/2018   Procedure: LAPAROSCOPIC SIGMOID COLECTOMY;  Surgeon: Jules Husbands, MD;  Location: [facility] ORS;  Service: General;;  . ORIF WRIST FRACTURE Right 08/25/2015   Procedure: OPEN REDUCTION INTERNAL FIXATION (ORIF) WRIST FRACTURE;  Surgeon: Corky Mull, MD;  Location: [facility] ORS;  Service: Orthopedics;  Laterality: Right;    Medical History: Past Medical History:  Diagnosis Date  . CHF (congestive heart failure) ([place])   . COPD (chronic obstructive pulmonary disease) ([place])   . Current smoker   . Opiate abuse, continuous (HCC)     Family History: Family History  Problem Relation Age of Onset  . COPD Mother   . Hypertension Father   . Diabetes Father     Social History: Social History   Socioeconomic History  . Marital  status: Married    Spouse name: Not on file  . Number of children: Not on file  . Years of education: Not on file  . Highest education level: Not on file  Occupational History  . Not on file  Tobacco Use  . Smoking status: Current Some Day Smoker    Years: 15.00    Types: Cigarettes  . Smokeless tobacco: Never Used  . Tobacco comment: 4 cigarettes a day   Substance and Sexual Activity  . Alcohol use: Yes    Comment: ocassionally   . Drug use: Yes    Types: Marijuana    Comment: not everyday   . Sexual activity: Not on file  Other Topics Concern  . Not on file  Social History Narrative  . Not on file   Social Determinants of Health   Financial Resource Strain:   . Difficulty of Paying Living Expenses: Not on file  Food Insecurity:   . Worried About Charity fundraiser in the Last Year: Not on file  . Ran  Out of Food in the Last Year: Not on file  Transportation Needs:   . Lack of Transportation (Medical): Not on file  . Lack of Transportation (Non-Medical): Not on file  Physical Activity:   . Days of Exercise per Week: Not on file  . Minutes of Exercise per Session: Not on file  Stress:   . Feeling of Stress : Not on file  Social Connections:   . Frequency of Communication with Friends and Family: Not on file  . Frequency of Social Gatherings with Friends and Family: Not on file  . Attends Religious Services: Not on file  . Active Member of Clubs or Organizations: Not on file  . Attends Archivist Meetings: Not on file  . Marital Status: Not on file  Intimate Partner Violence:   . Fear of Current or Ex-Partner: Not on file  . Emotionally Abused: Not on file  . Physically Abused: Not on file  . Sexually Abused: Not on file    Vital Signs: Blood pressure 133/72, pulse 79, temperature 98.2 F (36.8 C), resp. rate 16, height 5\' 3"  (1.6 m), weight 201 lb 3.2 oz (91.3 kg), SpO2 95 %.  Examination: General Appearance: The patient is well-developed,  well-nourished, and in no distress. Skin: Gross inspection of skin unremarkable. Head: normocephalic, no gross deformities. Eyes: no gross deformities noted. ENT: ears appear grossly normal no exudates. Neck: Supple. No thyromegaly. No LAD. Respiratory: clear bilaterally. Cardiovascular: Normal S1 and S2 without murmur or rub. Extremities: No cyanosis. pulses are equal. Neurologic: Alert and oriented. No involuntary movements.  LABS: No results found for this or any previous visit (from the past 2160 hour(s)).  Radiology: DG Chest Portable 1 View  Result Date: 06/09/2018 CLINICAL DATA:  49 year old female with increased shortness of breath for 2 days. EXAM: PORTABLE CHEST 1 VIEW COMPARISON:  CT chest 06/05/2018 and earlier. FINDINGS: Portable AP upright view at 2355 hours. Stable lung volumes and mediastinal contours. Scattered calcified granulomas again noted in the lungs. No pneumothorax, pulmonary edema, or acute pulmonary opacity. Visualized tracheal air column is within normal limits. No acute osseous abnormality identified. IMPRESSION: No acute cardiopulmonary abnormality. Electronically Signed   By: Genevie Ann M.D.   On: 06/09/2018 00:13    No results found.  No results found.    Assessment and Plan: Patient Active Problem List   Diagnosis Date Noted  . OSA (obstructive sleep apnea)   . Noncompliance with CPAP treatment   . Chronic obstructive pulmonary disease ([place])   . Chest wall pain   . Essential hypertension   . Steroid-induced hyperglycemia   . Anemia of chronic disease   . AKI (acute kidney injury) ([place])   . Supplemental oxygen dependent   . Generalized anxiety disorder   . Hypoxic encephalopathy ([place]) 05/27/2018  . Anoxic brain injury ([place])   . ARF (acute renal failure) ([place])   . Lactic acidosis   . Transaminitis   . Acute on chronic respiratory failure with hypercapnia ([place])   . Cardiac arrest ([place]) 05/12/2018  . Diverticulitis of colon (without mention of  hemorrhage)(562.11)   . Acute diverticulitis 01/06/2018  . Mixed anxiety and depressive disorder 12/22/2017  . Obesity 12/22/2017  . Tobacco user 12/22/2017  . Diverticulitis 12/03/2017  . Spinal stenosis of lumbar region 04/12/2016  . Acute respiratory failure ([place]) 03/22/2016  . COPD exacerbation ([place]) 03/22/2016  . Chronic back pain 03/22/2016  . Opioid type dependence, abuse ([place]) 03/22/2016  . Polycythemia 03/22/2016  . Anxiety  03/22/2016  . OSA on CPAP 03/22/2016  . Closed fracture of distal end of right radius 08/25/2015  . Closed nondisplaced fracture of styloid process of right ulna 08/25/2015   1. Chronic obstructive pulmonary disease, unspecified COPD type ([place]) Severe disesase, continue to use oxygen and other medications as prescribed.  - ALPRAZolam (XANAX) 0.25 MG tablet; Take 1 tablet (0.25 mg total) by mouth 3 (three) times daily.  Dispense: 90 tablet; Refill: 0  2. OSA treated with BiPAP Continue to use bipap nightly.   3. Chronic respiratory failure with hypoxia (HCC) Continue to use supplemental oxygen as prescribed.  - ALPRAZolam (XANAX) 0.25 MG tablet; Take 1 tablet (0.25 mg total) by mouth 3 (three) times daily.  Dispense: 90 tablet; Refill: 0  4. Supplemental oxygen dependent Continue with oxygen 4 LPM continuous.  5. Essential hypertension Controlled, continue present therapy.   6. BMI 35.0-35.9,adult comorbidity include OSA, HTN  7. Anxiety disorder due to multiple medical problems Reviewed risks and possible side effects associated with taking opiates, benzodiazepines and other CNS depressants. Combination of these could cause dizziness and drowsiness. Advised patient not to drive or operate machinery when taking these medications, as patient's and other's life can be at risk and will have consequences. Patient verbalized understanding in this matter. Dependence and abuse for these drugs will be monitored closely. A Controlled substance policy and  procedure is on file which allows [facility] to order a urine drug screen test at any visit. Patient understands and agrees with the plan - ALPRAZolam (XANAX) 0.25 MG tablet; Take 1 tablet (0.25 mg total) by mouth 3 (three) times daily.  Dispense: 90 tablet; Refill: 0   General Counseling: I have discussed the findings of the evaluation and examination with Otila Kluver.  I have also discussed any further diagnostic evaluation thatmay be needed or ordered today. Charlotta verbalizes understanding of the findings of todays visit. We also reviewed her medications today and discussed drug interactions and side effects including but not limited excessive drowsiness and altered mental states. We also discussed that there is always a risk not just to her but also people around her. she has been encouraged to call the office with any questions or concerns that should arise related to todays visit.  No orders of the defined types were placed in this encounter.    Time spent: 30 This patient was seen by Orson Gear AGNP-C in Collaboration with Dr. Devona Konig as a part of collaborative care agreement.   I have personally obtained a history, examined the patient, evaluated laboratory and imaging results, formulated the assessment and plan and placed orders.    Allyne Gee, MD [facility] Pulmonary and Critical Care Sleep medicine

## 2019-03-23 ENCOUNTER — Telehealth: Payer: Self-pay

## 2019-03-23 NOTE — Telephone Encounter (Signed)
Confirmed appointment on 03/27/2019 and screened for covid. klh

## 2019-03-27 ENCOUNTER — Ambulatory Visit: Payer: 59 | Admitting: Internal Medicine

## 2019-03-29 ENCOUNTER — Telehealth: Payer: Self-pay

## 2019-03-29 ENCOUNTER — Ambulatory Visit: Payer: 59 | Admitting: Adult Health

## 2019-03-29 NOTE — Telephone Encounter (Signed)
CONFIRMED AND SCREENED FOR 04-02-19 OV.

## 2019-04-02 ENCOUNTER — Encounter: Payer: Self-pay | Admitting: Internal Medicine

## 2019-04-02 ENCOUNTER — Other Ambulatory Visit: Payer: Self-pay

## 2019-04-02 ENCOUNTER — Ambulatory Visit: Payer: 59 | Admitting: Internal Medicine

## 2019-04-02 VITALS — BP 145/76 | HR 80 | Temp 98.2°F | Resp 16 | Ht 64.0 in | Wt 201.2 lb

## 2019-04-02 DIAGNOSIS — R0609 Other forms of dyspnea: Secondary | ICD-10-CM

## 2019-04-02 DIAGNOSIS — K2100 Gastro-esophageal reflux disease with esophagitis, without bleeding: Secondary | ICD-10-CM

## 2019-04-02 DIAGNOSIS — J449 Chronic obstructive pulmonary disease, unspecified: Secondary | ICD-10-CM | POA: Diagnosis not present

## 2019-04-02 DIAGNOSIS — R0600 Dyspnea, unspecified: Secondary | ICD-10-CM

## 2019-04-02 DIAGNOSIS — F068 Other specified mental disorders due to known physiological condition: Secondary | ICD-10-CM

## 2019-04-02 DIAGNOSIS — G4733 Obstructive sleep apnea (adult) (pediatric): Secondary | ICD-10-CM

## 2019-04-02 DIAGNOSIS — J9611 Chronic respiratory failure with hypoxia: Secondary | ICD-10-CM

## 2019-04-02 DIAGNOSIS — R0602 Shortness of breath: Secondary | ICD-10-CM | POA: Diagnosis not present

## 2019-04-02 DIAGNOSIS — R5382 Chronic fatigue, unspecified: Secondary | ICD-10-CM

## 2019-04-02 MED ORDER — ALPRAZOLAM 0.25 MG PO TABS: 0.2500 mg | ORAL_TABLET | Freq: Three times a day (TID) | ORAL | 0 refills | Status: DC

## 2019-04-02 MED ORDER — PANTOPRAZOLE SODIUM 40 MG PO TBEC: 40.0000 mg | DELAYED_RELEASE_TABLET | Freq: Two times a day (BID) | ORAL | 2 refills | Status: DC

## 2019-04-02 MED ORDER — ESCITALOPRAM OXALATE 20 MG PO TABS: 20.0000 mg | ORAL_TABLET | Freq: Every day | ORAL | 1 refills | Status: DC

## 2019-04-02 NOTE — Progress Notes (Signed)
[facility] [place], [place] [address]  Pulmonary Sleep Medicine   Office Visit Note  Patient Name: Misty Green DOB: 1970/07/14 MRN [id number]  Date of Service: 04/02/2019  Complaints/HPI: Patient is here today for pulmonary follow-up, she was seen last month as a new pulmonary patient. Her biggest complaint today is dealing with extreme exhaustion. Suffered from cardiac arrest one year ago April of 2020 shortly after abdominal surgery for diverticulitis requiring colostomy placement. Not followed by cardiology. Currently wears 4LPM via [place] during the day and bleeds 4L of O2 through her bi-pap at night for OSA. Has not had a sleep study/titration study in many years. Reports that when she ambulates a short distance her oxygen levels drop into the 70's even while wearing her supplemental 4L . Severe shortness of breath with exertion even with small distances. Feels exhausted after doing one load of laundry and takes her several minutes to recover.  Currently using spiriva and advair daily and feels the need to use her albuterol rescue inhaler every day multiple times of the day.  ROS  General: (-) fever, (-) chills, (-) night sweats, (-) weakness Skin: (-) rashes, (-) itching,. Eyes: (-) visual changes, (-) redness, (-) itching. Nose and Sinuses: (-) nasal stuffiness or itchiness, (-) postnasal drip, (-) nosebleeds, (-) sinus trouble. Mouth and Throat: (-) sore throat, (-) hoarseness. Neck: (-) swollen glands, (-) enlarged thyroid, (-) neck pain. Respiratory: - cough, (-) bloody sputum, + shortness of breath, - wheezing. Cardiovascular: + ankle swelling, (-) chest pain. Lymphatic: (-) lymph node enlargement. Neurologic: (-) numbness, (-) tingling. Psychiatric: (-) anxiety, (-) depression   Current Medication: Outpatient Encounter Medications as of 04/02/2019  Medication Sig  . acetaminophen (TYLENOL) 325 MG tablet Take 1-2 tablets (325-650 mg total)  by mouth every 4 (four) hours as needed for mild pain.  Marland Kitchen albuterol (PROVENTIL HFA;VENTOLIN HFA) 108 (90 Base) MCG/ACT inhaler Inhale into the lungs every 6 (six) hours as needed for wheezing or shortness of breath.  Marland Kitchen albuterol (PROVENTIL) (2.5 MG/3ML) 0.083% nebulizer solution Take 2.5 mg by nebulization. Inhale one vial via nebulizer 4 times a day as directed.  Marland Kitchen ALPRAZolam (XANAX) 0.25 MG tablet Take 1 tablet (0.25 mg total) by mouth 3 (three) times daily.  Marland Kitchen amLODipine (NORVASC) 10 MG tablet TAKE 1 TABLET BY MOUTH EVERY DAY  . [facility] MELATONIN 3 MG TABS TAKE 1 TABLET (3 MG TOTAL) BY MOUTH AT BEDTIME. USE IN PLACE OF AMBIEN  . escitalopram (LEXAPRO) 10 MG tablet Take 1 tablet (10 mg total) by mouth daily.  . Fluticasone-Salmeterol (ADVAIR) 250-50 MCG/DOSE AEPB Inhale 1 puff into the lungs 2 (two) times daily.  . furosemide (LASIX) 20 MG tablet Take 20 mg by mouth daily.  Marland Kitchen gabapentin (NEURONTIN) 800 MG tablet Take 800 mg by mouth 3 (three) times daily.  . methocarbamol (ROBAXIN) 500 MG tablet Take 2 tablets (1,000 mg total) by mouth 3 (three) times daily.  . Multiple Vitamins-Minerals (MULTIVITAMIN WITH MINERALS) tablet Take 1 tablet by mouth daily.  . pantoprazole (PROTONIX) 40 MG tablet TAKE 1 TABLET BY MOUTH TWICE A DAY  . polyethylene glycol (MIRALAX / GLYCOLAX) 17 g packet Take 17 g by mouth 2 (two) times daily.  . QUEtiapine (SEROQUEL) 50 MG tablet Take 50 mg by mouth at bedtime.  . roflumilast (DALIRESP) 500 MCG TABS tablet Take 500 mcg by mouth daily.  Marland Kitchen tiotropium (SPIRIVA) 18 MCG inhalation capsule Place 18 mcg into inhaler and inhale daily.  . [  DISCONTINUED] amLODipine (NORVASC) 10 MG tablet Take 1 tablet (10 mg total) by mouth daily.  . [DISCONTINUED] Melatonin 3 MG TABS Take 1 tablet (3 mg total) by mouth at bedtime. Use in place of ambien  . [DISCONTINUED] pantoprazole (PROTONIX) 40 MG tablet Take 1 tablet (40 mg total) by mouth 2 (two) times daily.   No facility-administered  encounter medications on file as of 04/02/2019.    Surgical History: Past Surgical History:  Procedure Laterality Date  . ABDOMINAL HYSTERECTOMY    . BACK SURGERY    . cesction    . COLOSTOMY  01/13/2018   Procedure: COLOSTOMY Creation;  Surgeon: Jules Husbands, MD;  Location: [facility] ORS;  Service: General;;  . INCISIONAL HERNIA REPAIR     lower midline laparotomy incision (hysterectomy), repaired with mesh  . IR FLUORO GUIDE CV LINE RIGHT  05/26/2018  . IR REMOVAL TUN CV CATH W/O FL  06/02/2018  . IR US GUIDE VASC ACCESS RIGHT  05/26/2018  . LAPAROSCOPIC CHOLECYSTECTOMY  2012  . LAPAROSCOPIC LYSIS OF ADHESIONS  01/13/2018   Procedure: LAPAROSCOPIC LYSIS OF ADHESIONS;  Surgeon: Jules Husbands, MD;  Location: [facility] ORS;  Service: General;;  . LAPAROSCOPIC SIGMOID COLECTOMY  01/13/2018   Procedure: LAPAROSCOPIC SIGMOID COLECTOMY;  Surgeon: Jules Husbands, MD;  Location: [facility] ORS;  Service: General;;  . ORIF WRIST FRACTURE Right 08/25/2015   Procedure: OPEN REDUCTION INTERNAL FIXATION (ORIF) WRIST FRACTURE;  Surgeon: Corky Mull, MD;  Location: [facility] ORS;  Service: Orthopedics;  Laterality: Right;    Medical History: Past Medical History:  Diagnosis Date  . CHF (congestive heart failure) ([place])   . COPD (chronic obstructive pulmonary disease) ([place])   . Current smoker   . Opiate abuse, continuous (HCC)     Family History: Family History  Problem Relation Age of Onset  . COPD Mother   . Hypertension Father   . Diabetes Father     Social History: Social History   Socioeconomic History  . Marital status: Married    Spouse name: Not on file  . Number of children: Not on file  . Years of education: Not on file  . Highest education level: Not on file  Occupational History  . Not on file  Tobacco Use  . Smoking status: Current Some Day Smoker    Years: 15.00    Types: Cigarettes  . Smokeless tobacco: Never Used  . Tobacco comment: 4 cigarettes a day   Substance and Sexual Activity   . Alcohol use: Yes    Comment: ocassionally   . Drug use: Yes    Types: Marijuana    Comment: not everyday   . Sexual activity: Not on file  Other Topics Concern  . Not on file  Social History Narrative  . Not on file   Social Determinants of Health   Financial Resource Strain:   . Difficulty of Paying Living Expenses:   Food Insecurity:   . Worried About Charity fundraiser in the Last Year:   . Arboriculturist in the Last Year:   Transportation Needs:   . Film/video editor (Medical):   Marland Kitchen Lack of Transportation (Non-Medical):   Physical Activity:   . Days of Exercise per Week:   . Minutes of Exercise per Session:   Stress:   . Feeling of Stress :   Social Connections:   . Frequency of Communication with Friends and Family:   . Frequency of Social Gatherings with Friends and  Family:   . Attends Religious Services:   . Active Member of Clubs or Organizations:   . Attends Archivist Meetings:   Marland Kitchen Marital Status:   Intimate Partner Violence:   . Fear of Current or Ex-Partner:   . Emotionally Abused:   Marland Kitchen Physically Abused:   . Sexually Abused:     Vital Signs: Blood pressure (!) 145/76, pulse 80, temperature 98.2 F (36.8 C), resp. rate 16, height 5\' 4"  (1.626 m), weight 201 lb 3.2 oz (91.3 kg), SpO2 91 %.  Examination: General Appearance: The patient is well-developed, well-nourished, and in no distress. Skin: Gross inspection of skin unremarkable. Head: normocephalic, no gross deformities. Eyes: no gross deformities noted. ENT: ears appear grossly normal no exudates. Neck: Supple. No thyromegaly. No LAD. Respiratory: Diminished with expiratory wheezing bilaterally. Cardiovascular: Normal S1 and S2 without murmur or rub. Extremities: No cyanosis. pulses are equal. Neurologic: Alert and oriented. No involuntary movements.  LABS: No results found for this or any previous visit (from the past 2160 hour(s)).  Radiology: DG Chest Portable 1  View  Result Date: 06/09/2018 CLINICAL DATA:  49 year old female with increased shortness of breath for 2 days. EXAM: PORTABLE CHEST 1 VIEW COMPARISON:  CT chest 06/05/2018 and earlier. FINDINGS: Portable AP upright view at 2355 hours. Stable lung volumes and mediastinal contours. Scattered calcified granulomas again noted in the lungs. No pneumothorax, pulmonary edema, or acute pulmonary opacity. Visualized tracheal air column is within normal limits. No acute osseous abnormality identified. IMPRESSION: No acute cardiopulmonary abnormality. Electronically Signed   By: Genevie Ann M.D.   On: 06/09/2018 00:13    No results found.  No results found.    Assessment and Plan: Patient Active Problem List   Diagnosis Date Noted  . OSA (obstructive sleep apnea)   . Noncompliance with CPAP treatment   . Chronic obstructive pulmonary disease ([place])   . Chest wall pain   . Essential hypertension   . Steroid-induced hyperglycemia   . Anemia of chronic disease   . AKI (acute kidney injury) ([place])   . Supplemental oxygen dependent   . Generalized anxiety disorder   . Hypoxic encephalopathy ([place]) 05/27/2018  . Anoxic brain injury ([place])   . ARF (acute renal failure) ([place])   . Lactic acidosis   . Transaminitis   . Acute on chronic respiratory failure with hypercapnia ([place])   . Cardiac arrest ([place]) 05/12/2018  . Diverticulitis of colon (without mention of hemorrhage)(562.11)   . Acute diverticulitis 01/06/2018  . Mixed anxiety and depressive disorder 12/22/2017  . Obesity 12/22/2017  . Tobacco user 12/22/2017  . Diverticulitis 12/03/2017  . Spinal stenosis of lumbar region 04/12/2016  . Acute respiratory failure ([place]) 03/22/2016  . COPD exacerbation ([place]) 03/22/2016  . Chronic back pain 03/22/2016  . Opioid type dependence, abuse ([place]) 03/22/2016  . Polycythemia 03/22/2016  . Anxiety 03/22/2016  . OSA on CPAP 03/22/2016  . Closed fracture of distal end of right radius 08/25/2015  . Closed  nondisplaced fracture of styloid process of right ulna 08/25/2015    1. SOB (shortness of breath) Extreme shortness of breath with exertion that takes several minutes to recover. Sample of 152mcg Trelegy given to patient during this office visit to trial to see if symptoms improve. Will monitor effectiveness and send prescription if warranted. - CBC with Differential/Platelet - Lipid Panel With LDL/HDL Ratio - TSH - T4, free - Comprehensive metabolic panel  2. Chronic obstructive pulmonary disease, unspecified COPD type ([place]) Will need  PFT to follow-up on progression. Trail of Trelegy provided.  3. Chronic respiratory failure with hypoxia (HCC) Currently wears 4LPM via [place] at all times during the day, SpO2 stable today at 91% on her 4L. Will continue to monitor.  4. Anxiety disorder due to multiple medical problems S/p cardiac arrest and one year later still struggling with many complications her anxiety is hard to manage. Feels as though her current therapy is helping at this time. Continue to monitor. - ALPRAZolam (XANAX) 0.25 MG tablet; Take 1 tablet (0.25 mg total) by mouth 3 (three) times daily.  Dispense: 90 tablet; Refill: 0 - escitalopram (LEXAPRO) 20 MG tablet; Take 1 tablet (20 mg total) by mouth daily.  Dispense: 30 tablet; Refill: 1  5. Dyspnea on exertion Reports extreme dyspnea with minimal exertion and bilateral lower extremity edema. Will follow up on results on echo at next visit. - ECHOCARDIOGRAM COMPLETE; Future  6. OSA (obstructive sleep apnea) Currently using bi-pap and bleeds 4L through machine. It has been many years since sleep study or titration study, complaining of extreme exhaustion and headaches throughout the day. - PSG SLEEP STUDY; Future  7. Chronic fatigue Will check these levels due to complaint of exhaustion, will follow-up at next visit. - B12 - Vitamin D (25 hydroxy)  8. Gastroesophageal reflux disease with esophagitis without hemorrhage Stable  on current therapy, continue to monitor. - pantoprazole (PROTONIX) 40 MG tablet; Take 1 tablet (40 mg total) by mouth 2 (two) times daily.  Dispense: 60 tablet; Refill: 2  General Counseling: I have discussed the findings of the evaluation and examination with Otila Kluver.  I have also discussed any further diagnostic evaluation thatmay be needed or ordered today. Emmaly verbalizes understanding of the findings of todays visit. We also reviewed her medications today and discussed drug interactions and side effects including but not limited excessive drowsiness and altered mental states. We also discussed that there is always a risk not just to her but also people around her. she has been encouraged to call the office with any questions or concerns that should arise related to todays visit.  No orders of the defined types were placed in this encounter.    Time spent: 30 This patient was seen by Orson Gear AGNP-C in Collaboration with Dr. Devona Konig as a part of collaborative care agreement.   I have personally obtained a history, examined the patient, evaluated laboratory and imaging results, formulated the assessment and plan and placed orders.    Allyne Gee, MD [facility] Pulmonary and Critical Care Sleep medicine

## 2019-04-04 ENCOUNTER — Other Ambulatory Visit: Payer: 59 | Admitting: Internal Medicine

## 2019-04-10 ENCOUNTER — Telehealth: Payer: Self-pay

## 2019-04-10 ENCOUNTER — Other Ambulatory Visit: Payer: Self-pay

## 2019-04-10 MED ORDER — TRELEGY ELLIPTA 100-62.5-25 MCG/INH IN AEPB: 1.0000 | INHALATION_SPRAY | Freq: Every day | RESPIRATORY_TRACT | 3 refills | Status: DC

## 2019-04-10 NOTE — Telephone Encounter (Signed)
Pt called that adam gave her trelegy samples its helping her as per adam send pres to [facility]

## 2019-04-11 ENCOUNTER — Ambulatory Visit: Payer: 59 | Admitting: Internal Medicine

## 2019-04-11 DIAGNOSIS — G4733 Obstructive sleep apnea (adult) (pediatric): Secondary | ICD-10-CM

## 2019-04-12 ENCOUNTER — Telehealth: Payer: Self-pay

## 2019-04-12 NOTE — Telephone Encounter (Signed)
Confirmed appointment on 04/16/2019 and screened for covid. klh 

## 2019-04-13 ENCOUNTER — Ambulatory Visit: Payer: 59

## 2019-04-13 ENCOUNTER — Other Ambulatory Visit: Payer: Self-pay

## 2019-04-13 DIAGNOSIS — R0602 Shortness of breath: Secondary | ICD-10-CM | POA: Diagnosis not present

## 2019-04-13 DIAGNOSIS — R0609 Other forms of dyspnea: Secondary | ICD-10-CM

## 2019-04-13 MED ORDER — AMLODIPINE BESYLATE 10 MG PO TABS: 10.0000 mg | ORAL_TABLET | Freq: Every day | ORAL | 0 refills | Status: DC

## 2019-04-13 MED ORDER — QUETIAPINE FUMARATE 50 MG PO TABS: 50.0000 mg | ORAL_TABLET | Freq: Every day | ORAL | 0 refills | Status: DC

## 2019-04-16 ENCOUNTER — Ambulatory Visit: Payer: 59 | Admitting: Adult Health

## 2019-04-16 ENCOUNTER — Other Ambulatory Visit: Payer: Self-pay | Admitting: Adult Health

## 2019-04-16 DIAGNOSIS — G4733 Obstructive sleep apnea (adult) (pediatric): Secondary | ICD-10-CM

## 2019-04-19 ENCOUNTER — Telehealth: Payer: Self-pay

## 2019-04-19 NOTE — Telephone Encounter (Signed)
Confirmed appointment on 04/25/2019 and screened for covid. klh

## 2019-04-23 ENCOUNTER — Telehealth: Payer: Self-pay

## 2019-04-23 NOTE — Telephone Encounter (Signed)
Confirmed appointment and screened for covid. klh

## 2019-04-24 ENCOUNTER — Other Ambulatory Visit: Payer: 59 | Admitting: Internal Medicine

## 2019-04-25 ENCOUNTER — Ambulatory Visit: Payer: 59 | Admitting: Family Medicine

## 2019-04-25 ENCOUNTER — Other Ambulatory Visit: Payer: 59 | Admitting: Internal Medicine

## 2019-04-25 ENCOUNTER — Other Ambulatory Visit: Payer: Self-pay

## 2019-04-25 DIAGNOSIS — F068 Other specified mental disorders due to known physiological condition: Secondary | ICD-10-CM

## 2019-04-25 DIAGNOSIS — Z0289 Encounter for other administrative examinations: Secondary | ICD-10-CM

## 2019-04-25 MED ORDER — ESCITALOPRAM OXALATE 20 MG PO TABS: 20.0000 mg | ORAL_TABLET | Freq: Every day | ORAL | 0 refills | Status: DC

## 2019-04-26 ENCOUNTER — Telehealth: Payer: Self-pay

## 2019-04-26 NOTE — Telephone Encounter (Signed)
Called lmom informing patient of appointment on 04/30/2019. klh

## 2019-04-30 ENCOUNTER — Ambulatory Visit: Payer: 59 | Admitting: Adult Health

## 2019-04-30 ENCOUNTER — Telehealth: Payer: Self-pay

## 2019-04-30 NOTE — Telephone Encounter (Signed)
BILLED MISSED APPT FEE

## 2019-05-02 ENCOUNTER — Telehealth: Payer: Self-pay

## 2019-05-02 NOTE — Telephone Encounter (Signed)
Confirmed and screened for 05-04-19 ov.

## 2019-05-04 ENCOUNTER — Ambulatory Visit: Payer: 59 | Admitting: Nurse Practitioner

## 2019-05-04 ENCOUNTER — Other Ambulatory Visit: Payer: Self-pay

## 2019-05-04 ENCOUNTER — Encounter: Payer: Self-pay | Admitting: Nurse Practitioner

## 2019-05-04 VITALS — BP 117/66 | HR 76 | Temp 98.5°F | Resp 16 | Ht 64.0 in | Wt 204.8 lb

## 2019-05-04 DIAGNOSIS — F068 Other specified mental disorders due to known physiological condition: Secondary | ICD-10-CM

## 2019-05-04 DIAGNOSIS — J449 Chronic obstructive pulmonary disease, unspecified: Secondary | ICD-10-CM | POA: Diagnosis not present

## 2019-05-04 DIAGNOSIS — R5382 Chronic fatigue, unspecified: Secondary | ICD-10-CM

## 2019-05-04 DIAGNOSIS — J302 Other seasonal allergic rhinitis: Secondary | ICD-10-CM

## 2019-05-04 DIAGNOSIS — R0602 Shortness of breath: Secondary | ICD-10-CM

## 2019-05-04 DIAGNOSIS — I10 Essential (primary) hypertension: Secondary | ICD-10-CM

## 2019-05-04 DIAGNOSIS — Z1231 Encounter for screening mammogram for malignant neoplasm of breast: Secondary | ICD-10-CM

## 2019-05-04 DIAGNOSIS — R609 Edema, unspecified: Secondary | ICD-10-CM

## 2019-05-04 MED ORDER — MONTELUKAST SODIUM 10 MG PO TABS: 10.0000 mg | ORAL_TABLET | Freq: Every day | ORAL | 3 refills | Status: DC

## 2019-05-04 MED ORDER — FUROSEMIDE 20 MG PO TABS: 20.0000 mg | ORAL_TABLET | Freq: Every day | ORAL | 1 refills | Status: DC

## 2019-05-04 MED ORDER — ALPRAZOLAM 0.25 MG PO TABS: 0.2500 mg | ORAL_TABLET | Freq: Three times a day (TID) | ORAL | 2 refills | Status: DC

## 2019-05-04 NOTE — Progress Notes (Signed)
[facility] [place], [place] [address]  Internal MEDICINE  Office Visit Note  Patient Name: Misty Green  [id number]  [id number]  Date of Service: 05/16/2019   Complaints/HPI Pt is here for establishment of PCP. Chief Complaint  Patient presents with  . New Patient (Initial Visit)  . Gastroesophageal Reflux  . Anxiety    very bad anxiety  . COPD  . Fatigue    low energy   . Shortness of Breath    walking short distances    The patient is here to establish primary care. She was seeing Dr. Luan Pulling as her PCP. He has retired. She has established care, already, with pulmonology here. Routine, fasting labs have been ordered. She has not gotten these done yet. She is due to have screening mammogram. She has chronic anxiety. She takes alprazolam 0.25mg  up to three times daily if needed for acute anxiety. She needs to have new prescription for this today.     Current Medication: Outpatient Encounter Medications as of 05/04/2019  Medication Sig  . acetaminophen (TYLENOL) 325 MG tablet Take 1-2 tablets (325-650 mg total) by mouth every 4 (four) hours as needed for mild pain.  Marland Kitchen albuterol (PROVENTIL HFA;VENTOLIN HFA) 108 (90 Base) MCG/ACT inhaler Inhale into the lungs every 6 (six) hours as needed for wheezing or shortness of breath.  Marland Kitchen albuterol (PROVENTIL) (2.5 MG/3ML) 0.083% nebulizer solution Take 2.5 mg by nebulization. Inhale one vial via nebulizer 4 times a day as directed.  Marland Kitchen ALPRAZolam (XANAX) 0.25 MG tablet Take 1 tablet (0.25 mg total) by mouth 3 (three) times daily.  Marland Kitchen amLODipine (NORVASC) 10 MG tablet Take 1 tablet (10 mg total) by mouth daily.  . [facility] MELATONIN 3 MG TABS TAKE 1 TABLET (3 MG TOTAL) BY MOUTH AT BEDTIME. USE IN PLACE OF AMBIEN  . escitalopram (LEXAPRO) 20 MG tablet Take 1 tablet (20 mg total) by mouth daily.  . Fluticasone-Umeclidin-Vilant (TRELEGY ELLIPTA) 100-62.5-25 MCG/INH AEPB Inhale 1 each into the lungs daily.  . furosemide  (LASIX) 20 MG tablet Take 1 tablet (20 mg total) by mouth daily.  Marland Kitchen gabapentin (NEURONTIN) 800 MG tablet Take 800 mg by mouth 3 (three) times daily.  . methocarbamol (ROBAXIN) 500 MG tablet Take 2 tablets (1,000 mg total) by mouth 3 (three) times daily.  . Multiple Vitamins-Minerals (MULTIVITAMIN WITH MINERALS) tablet Take 1 tablet by mouth daily.  . pantoprazole (PROTONIX) 40 MG tablet Take 1 tablet (40 mg total) by mouth 2 (two) times daily.  . polyethylene glycol (MIRALAX / GLYCOLAX) 17 g packet Take 17 g by mouth 2 (two) times daily.  . QUEtiapine (SEROQUEL) 50 MG tablet Take 1 tablet (50 mg total) by mouth at bedtime.  . roflumilast (DALIRESP) 500 MCG TABS tablet Take 500 mcg by mouth daily.  . [DISCONTINUED] ALPRAZolam (XANAX) 0.25 MG tablet Take 1 tablet (0.25 mg total) by mouth 3 (three) times daily.  . [DISCONTINUED] furosemide (LASIX) 20 MG tablet Take 20 mg by mouth daily.  . montelukast (SINGULAIR) 10 MG tablet Take 1 tablet (10 mg total) by mouth at bedtime.  . [DISCONTINUED] amLODipine (NORVASC) 10 MG tablet Take 1 tablet (10 mg total) by mouth daily.  . [DISCONTINUED] Fluticasone-Salmeterol (ADVAIR) 250-50 MCG/DOSE AEPB Inhale 1 puff into the lungs 2 (two) times daily.  . [DISCONTINUED] Melatonin 3 MG TABS Take 1 tablet (3 mg total) by mouth at bedtime. Use in place of ambien  . [DISCONTINUED] pantoprazole (PROTONIX) 40 MG tablet Take 1 tablet (  40 mg total) by mouth 2 (two) times daily.  . [DISCONTINUED] tiotropium (SPIRIVA) 18 MCG inhalation capsule Place 18 mcg into inhaler and inhale daily.   No facility-administered encounter medications on file as of 05/04/2019.    Surgical History: Past Surgical History:  Procedure Laterality Date  . ABDOMINAL HYSTERECTOMY    . BACK SURGERY    . cesction    . COLOSTOMY  01/13/2018   Procedure: COLOSTOMY Creation;  Surgeon: Jules Husbands, MD;  Location: [facility] ORS;  Service: General;;  . INCISIONAL HERNIA REPAIR     lower midline  laparotomy incision (hysterectomy), repaired with mesh  . IR FLUORO GUIDE CV LINE RIGHT  05/26/2018  . IR REMOVAL TUN CV CATH W/O FL  06/02/2018  . IR US GUIDE VASC ACCESS RIGHT  05/26/2018  . LAPAROSCOPIC CHOLECYSTECTOMY  2012  . LAPAROSCOPIC LYSIS OF ADHESIONS  01/13/2018   Procedure: LAPAROSCOPIC LYSIS OF ADHESIONS;  Surgeon: Jules Husbands, MD;  Location: [facility] ORS;  Service: General;;  . LAPAROSCOPIC SIGMOID COLECTOMY  01/13/2018   Procedure: LAPAROSCOPIC SIGMOID COLECTOMY;  Surgeon: Jules Husbands, MD;  Location: [facility] ORS;  Service: General;;  . ORIF WRIST FRACTURE Right 08/25/2015   Procedure: OPEN REDUCTION INTERNAL FIXATION (ORIF) WRIST FRACTURE;  Surgeon: Corky Mull, MD;  Location: [facility] ORS;  Service: Orthopedics;  Laterality: Right;    Medical History: Past Medical History:  Diagnosis Date  . CHF (congestive heart failure) ([place])   . COPD (chronic obstructive pulmonary disease) ([place])   . Current smoker   . Opiate abuse, continuous ([facility])     Family History: Family History  Problem Relation Age of Onset  . COPD Mother   . Hypertension Father   . Diabetes Father     Social History   Socioeconomic History  . Marital status: Married    Spouse name: Not on file  . Number of children: Not on file  . Years of education: Not on file  . Highest education level: Not on file  Occupational History  . Not on file  Tobacco Use  . Smoking status: Current Some Day Smoker    Years: 15.00    Types: Cigarettes  . Smokeless tobacco: Never Used  . Tobacco comment: 4 cigarettes a day   Substance and Sexual Activity  . Alcohol use: Yes    Comment: ocassionally   . Drug use: Yes    Types: Marijuana    Comment: not everyday   . Sexual activity: Not on file  Other Topics Concern  . Not on file  Social History Narrative  . Not on file   Social Determinants of Health   Financial Resource Strain:   . Difficulty of Paying Living Expenses:   Food Insecurity:   . Worried About  Charity fundraiser in the Last Year:   . Arboriculturist in the Last Year:   Transportation Needs:   . Film/video editor (Medical):   Marland Kitchen Lack of Transportation (Non-Medical):   Physical Activity:   . Days of Exercise per Week:   . Minutes of Exercise per Session:   Stress:   . Feeling of Stress :   Social Connections:   . Frequency of Communication with Friends and Family:   . Frequency of Social Gatherings with Friends and Family:   . Attends Religious Services:   . Active Member of Clubs or Organizations:   . Attends Archivist Meetings:   Marland Kitchen Marital Status:   Intimate Production manager  Violence:   . Fear of Current or Ex-Partner:   . Emotionally Abused:   Marland Kitchen Physically Abused:   . Sexually Abused:      Review of Systems  Constitutional: Positive for fatigue. Negative for chills and unexpected weight change.  HENT: Negative for congestion, postnasal drip, rhinorrhea, sneezing and sore throat.   Respiratory: Positive for shortness of breath and wheezing. Negative for cough and chest tightness.   Cardiovascular: Negative for chest pain and palpitations.  Gastrointestinal: Negative for abdominal pain, constipation, diarrhea, nausea and vomiting.  Endocrine: Negative for cold intolerance, heat intolerance, polydipsia and polyuria.  Musculoskeletal: Negative for arthralgias, back pain, joint swelling and neck pain.  Skin: Negative for rash.  Allergic/Immunologic: Positive for environmental allergies.  Neurological: Negative for dizziness, tremors, numbness and headaches.  Hematological: Negative for adenopathy. Does not bruise/bleed easily.  Psychiatric/Behavioral: Positive for sleep disturbance. Negative for behavioral problems (Depression) and suicidal ideas. The patient is nervous/anxious.     Today's Vitals   05/04/19 1454  BP: 117/66  Pulse: 76  Resp: 16  Temp: 98.5 F (36.9 C)  SpO2: 96%  Weight: 204 lb 12.8 oz (92.9 kg)  Height: 5\' 4"  (1.626 m)   Body mass  index is 35.15 kg/m.   Physical Exam Vitals and nursing note reviewed.  Constitutional:      General: She is not in acute distress.    Appearance: Normal appearance. She is well-developed. She is not diaphoretic.  HENT:     Head: Normocephalic and atraumatic.     Nose: Nose normal.     Mouth/Throat:     Pharynx: No oropharyngeal exudate.  Eyes:     Pupils: Pupils are equal, round, and reactive to light.  Neck:     Thyroid: No thyromegaly.     Vascular: No carotid bruit or JVD.     Trachea: No tracheal deviation.  Cardiovascular:     Rate and Rhythm: Normal rate and regular rhythm.     Heart sounds: Normal heart sounds. No murmur. No friction rub. No gallop.   Pulmonary:     Effort: Pulmonary effort is normal. No respiratory distress.     Breath sounds: Normal breath sounds. No wheezing or rales.  Chest:     Chest wall: No tenderness.  Abdominal:     Palpations: Abdomen is soft.  Musculoskeletal:        General: Normal range of motion.     Cervical back: Normal range of motion and neck supple.  Lymphadenopathy:     Cervical: No cervical adenopathy.  Skin:    General: Skin is warm and dry.  Neurological:     Mental Status: She is alert and oriented to person, place, and time.     Cranial Nerves: No cranial nerve deficit.  Psychiatric:        Attention and Perception: Attention and perception normal.        Mood and Affect: Affect normal. Mood is anxious.        Speech: Speech normal.        Behavior: Behavior normal. Behavior is cooperative.        Thought Content: Thought content normal.        Cognition and Memory: Cognition normal.        Judgment: Judgment normal.    Assessment/Plan: 1. Chronic fatigue Routine labs ordered along with thyroid and full anemia panels for further evaluation. Patient has also been scheduled for sleep study   2. SOB (shortness of breath) Recently had  echocardiogram done but results are not yet available. Will review when results  present.   3. Essential hypertension Stable. Continue bp medication as prescrbed   4. Chronic obstructive pulmonary disease, unspecified COPD type ([place]) Continue regular visits with Dr. Devona Konig for management of COPD  5. Seasonal allergic reaction - montelukast (SINGULAIR) 10 MG tablet; Take 1 tablet (10 mg total) by mouth at bedtime.  Dispense: 30 tablet; Refill: 3  6. Anxiety disorder due to multiple medical problems May take alprazolam 0.25mg  up to three times daily as needed for acute anxiety.  - ALPRAZolam (XANAX) 0.25 MG tablet; Take 1 tablet (0.25 mg total) by mouth 3 (three) times daily.  Dispense: 90 tablet; Refill: 2  7. Edema, unspecified type - furosemide (LASIX) 20 MG tablet; Take 1 tablet (20 mg total) by mouth daily.  Dispense: 90 tablet; Refill: 1  8. Encounter for screening mammogram for malignant neoplasm of breast - MM DIGITAL SCREENING BILATERAL; Future  General Counseling: Gabryela verbalizes understanding of the findings of todays visit and agrees with plan of treatment. I have discussed any further diagnostic evaluation that may be needed or ordered today. We also reviewed her medications today. she has been encouraged to call the office with any questions or concerns that should arise related to todays visit.    Counseling:  This patient was seen by Leretha Pol FNP Collaboration with Dr Lavera Guise as a part of collaborative care agreement  Orders Placed This Encounter  Procedures  . MM DIGITAL SCREENING BILATERAL    Meds ordered this encounter  Medications  . montelukast (SINGULAIR) 10 MG tablet    Sig: Take 1 tablet (10 mg total) by mouth at bedtime.    Dispense:  30 tablet    Refill:  3    Order Specific Question:   Supervising Provider    Answer:   Lavera Guise [[id number]]  . ALPRAZolam (XANAX) 0.25 MG tablet    Sig: Take 1 tablet (0.25 mg total) by mouth 3 (three) times daily.    Dispense:  90 tablet    Refill:  2    Order Specific Question:    Supervising Provider    Answer:   Lavera Guise [[id number]]  . furosemide (LASIX) 20 MG tablet    Sig: Take 1 tablet (20 mg total) by mouth daily.    Dispense:  90 tablet    Refill:  1    Order Specific Question:   Supervising Provider    Answer:   Lavera Guise [[id number]]    Time spent: 40 Minutes

## 2019-05-16 DIAGNOSIS — J302 Other seasonal allergic rhinitis: Secondary | ICD-10-CM | POA: Insufficient documentation

## 2019-05-16 DIAGNOSIS — R0602 Shortness of breath: Secondary | ICD-10-CM | POA: Insufficient documentation

## 2019-05-16 DIAGNOSIS — R609 Edema, unspecified: Secondary | ICD-10-CM | POA: Insufficient documentation

## 2019-05-16 DIAGNOSIS — Z7189 Other specified counseling: Secondary | ICD-10-CM | POA: Insufficient documentation

## 2019-05-16 DIAGNOSIS — F068 Other specified mental disorders due to known physiological condition: Secondary | ICD-10-CM | POA: Insufficient documentation

## 2019-05-16 DIAGNOSIS — Z1231 Encounter for screening mammogram for malignant neoplasm of breast: Secondary | ICD-10-CM | POA: Insufficient documentation

## 2019-05-16 DIAGNOSIS — R5382 Chronic fatigue, unspecified: Secondary | ICD-10-CM | POA: Insufficient documentation

## 2019-06-07 ENCOUNTER — Telehealth: Payer: Self-pay

## 2019-06-07 NOTE — Telephone Encounter (Signed)
Confirmed and screened for 06-11-19 ov.

## 2019-06-11 ENCOUNTER — Encounter: Payer: 59 | Admitting: Nurse Practitioner

## 2019-06-21 ENCOUNTER — Other Ambulatory Visit: Payer: Self-pay

## 2019-06-21 MED ORDER — TRELEGY ELLIPTA 100-62.5-25 MCG/INH IN AEPB: 1.0000 | INHALATION_SPRAY | Freq: Every day | RESPIRATORY_TRACT | 3 refills | Status: DC

## 2019-06-25 ENCOUNTER — Ambulatory Visit: Payer: 59 | Admitting: Internal Medicine

## 2019-06-25 ENCOUNTER — Ambulatory Visit: Payer: 59 | Admitting: Adult Health

## 2019-07-04 ENCOUNTER — Other Ambulatory Visit: Payer: Self-pay

## 2019-07-04 ENCOUNTER — Telehealth: Payer: Self-pay | Admitting: Internal Medicine

## 2019-07-04 DIAGNOSIS — F068 Other specified mental disorders due to known physiological condition: Secondary | ICD-10-CM

## 2019-07-04 MED ORDER — QUETIAPINE FUMARATE 50 MG PO TABS: 50.0000 mg | ORAL_TABLET | Freq: Every day | ORAL | 0 refills | Status: DC

## 2019-07-04 MED ORDER — AMLODIPINE BESYLATE 10 MG PO TABS: 10.0000 mg | ORAL_TABLET | Freq: Every day | ORAL | 0 refills | Status: DC

## 2019-07-04 MED ORDER — ALPRAZOLAM 0.25 MG PO TABS: 0.2500 mg | ORAL_TABLET | Freq: Three times a day (TID) | ORAL | 0 refills | Status: DC

## 2019-07-04 MED ORDER — GABAPENTIN 800 MG PO TABS: 800.0000 mg | ORAL_TABLET | Freq: Three times a day (TID) | ORAL | 0 refills | Status: DC

## 2019-07-04 MED ORDER — ROFLUMILAST 500 MCG PO TABS: 500.0000 ug | ORAL_TABLET | Freq: Every day | ORAL | 0 refills | Status: DC

## 2019-07-04 NOTE — Telephone Encounter (Signed)
Patient has been advised her next appointment missed in office will result in practice discharge. Misty Green

## 2019-07-05 ENCOUNTER — Telehealth: Payer: Self-pay

## 2019-07-05 NOTE — Telephone Encounter (Signed)
Confirmed and screened for 07-09-19 ov.

## 2019-07-08 ENCOUNTER — Emergency Department (HOSPITAL_COMMUNITY): Payer: 59

## 2019-07-08 ENCOUNTER — Other Ambulatory Visit: Payer: Self-pay

## 2019-07-08 ENCOUNTER — Inpatient Hospital Stay (HOSPITAL_COMMUNITY)
Admission: EM | Admit: 2019-07-08 | Discharge: 2019-07-12 | DRG: 189 | Disposition: A | Discharge status: Home Health | Payer: 59 | Attending: Student | Admitting: Student

## 2019-07-08 ENCOUNTER — Encounter (HOSPITAL_COMMUNITY): Payer: Self-pay

## 2019-07-08 DIAGNOSIS — J441 Chronic obstructive pulmonary disease with (acute) exacerbation: Secondary | ICD-10-CM | POA: Diagnosis present

## 2019-07-08 DIAGNOSIS — E872 Acidosis: Secondary | ICD-10-CM | POA: Diagnosis present

## 2019-07-08 DIAGNOSIS — Z79890 Hormone replacement therapy (postmenopausal): Secondary | ICD-10-CM

## 2019-07-08 DIAGNOSIS — G4733 Obstructive sleep apnea (adult) (pediatric): Secondary | ICD-10-CM | POA: Diagnosis not present

## 2019-07-08 DIAGNOSIS — K219 Gastro-esophageal reflux disease without esophagitis: Secondary | ICD-10-CM | POA: Diagnosis present

## 2019-07-08 DIAGNOSIS — M792 Neuralgia and neuritis, unspecified: Secondary | ICD-10-CM | POA: Diagnosis not present

## 2019-07-08 DIAGNOSIS — Z90710 Acquired absence of both cervix and uterus: Secondary | ICD-10-CM

## 2019-07-08 DIAGNOSIS — J9811 Atelectasis: Secondary | ICD-10-CM | POA: Diagnosis present

## 2019-07-08 DIAGNOSIS — Z933 Colostomy status: Secondary | ICD-10-CM

## 2019-07-08 DIAGNOSIS — Z20822 Contact with and (suspected) exposure to covid-19: Secondary | ICD-10-CM | POA: Diagnosis present

## 2019-07-08 DIAGNOSIS — R0602 Shortness of breath: Secondary | ICD-10-CM

## 2019-07-08 DIAGNOSIS — J9692 Respiratory failure, unspecified with hypercapnia: Secondary | ICD-10-CM | POA: Diagnosis present

## 2019-07-08 DIAGNOSIS — I5032 Chronic diastolic (congestive) heart failure: Secondary | ICD-10-CM | POA: Diagnosis present

## 2019-07-08 DIAGNOSIS — Z8249 Family history of ischemic heart disease and other diseases of the circulatory system: Secondary | ICD-10-CM

## 2019-07-08 DIAGNOSIS — F419 Anxiety disorder, unspecified: Secondary | ICD-10-CM | POA: Diagnosis present

## 2019-07-08 DIAGNOSIS — F17210 Nicotine dependence, cigarettes, uncomplicated: Secondary | ICD-10-CM | POA: Diagnosis present

## 2019-07-08 DIAGNOSIS — Z7189 Other specified counseling: Secondary | ICD-10-CM | POA: Diagnosis not present

## 2019-07-08 DIAGNOSIS — J9622 Acute and chronic respiratory failure with hypercapnia: Secondary | ICD-10-CM | POA: Diagnosis present

## 2019-07-08 DIAGNOSIS — R0902 Hypoxemia: Secondary | ICD-10-CM

## 2019-07-08 DIAGNOSIS — Z833 Family history of diabetes mellitus: Secondary | ICD-10-CM | POA: Diagnosis not present

## 2019-07-08 DIAGNOSIS — I110 Hypertensive heart disease with heart failure: Secondary | ICD-10-CM | POA: Diagnosis present

## 2019-07-08 DIAGNOSIS — F17200 Nicotine dependence, unspecified, uncomplicated: Secondary | ICD-10-CM | POA: Diagnosis not present

## 2019-07-08 DIAGNOSIS — Z825 Family history of asthma and other chronic lower respiratory diseases: Secondary | ICD-10-CM | POA: Diagnosis not present

## 2019-07-08 DIAGNOSIS — I10 Essential (primary) hypertension: Secondary | ICD-10-CM | POA: Diagnosis not present

## 2019-07-08 DIAGNOSIS — E662 Morbid (severe) obesity with alveolar hypoventilation: Secondary | ICD-10-CM | POA: Diagnosis present

## 2019-07-08 DIAGNOSIS — G629 Polyneuropathy, unspecified: Secondary | ICD-10-CM | POA: Diagnosis present

## 2019-07-08 DIAGNOSIS — J9601 Acute respiratory failure with hypoxia: Secondary | ICD-10-CM

## 2019-07-08 DIAGNOSIS — G4700 Insomnia, unspecified: Secondary | ICD-10-CM | POA: Diagnosis present

## 2019-07-08 DIAGNOSIS — J9621 Acute and chronic respiratory failure with hypoxia: Secondary | ICD-10-CM | POA: Diagnosis present

## 2019-07-08 DIAGNOSIS — Z6835 Body mass index (BMI) 35.0-35.9, adult: Secondary | ICD-10-CM | POA: Diagnosis not present

## 2019-07-08 DIAGNOSIS — F068 Other specified mental disorders due to known physiological condition: Secondary | ICD-10-CM

## 2019-07-08 DIAGNOSIS — F329 Major depressive disorder, single episode, unspecified: Secondary | ICD-10-CM | POA: Diagnosis not present

## 2019-07-08 DIAGNOSIS — K5900 Constipation, unspecified: Secondary | ICD-10-CM | POA: Diagnosis present

## 2019-07-08 DIAGNOSIS — Z716 Tobacco abuse counseling: Secondary | ICD-10-CM

## 2019-07-08 DIAGNOSIS — Z9189 Other specified personal risk factors, not elsewhere classified: Secondary | ICD-10-CM | POA: Diagnosis not present

## 2019-07-08 DIAGNOSIS — Z79899 Other long term (current) drug therapy: Secondary | ICD-10-CM | POA: Diagnosis not present

## 2019-07-08 DIAGNOSIS — J9612 Chronic respiratory failure with hypercapnia: Secondary | ICD-10-CM | POA: Diagnosis present

## 2019-07-08 HISTORY — DX: Dyspnea, unspecified: R06.00

## 2019-07-08 HISTORY — DX: Gastro-esophageal reflux disease without esophagitis: K21.9

## 2019-07-08 LAB — I-STAT ARTERIAL BLOOD GAS, ED
Acid-Base Excess: 18 mmol/L — ABNORMAL HIGH (ref 0.0–2.0)
Bicarbonate: 50.3 mmol/L — ABNORMAL HIGH (ref 20.0–28.0)
Calcium, Ion: 1.18 mmol/L (ref 1.15–1.40)
HCT: 44 % (ref 36.0–46.0)
Hemoglobin: 15 g/dL (ref 12.0–15.0)
O2 Saturation: 88 %
Patient temperature: 98
Potassium: 2.9 mmol/L — ABNORMAL LOW (ref 3.5–5.1)
Sodium: 140 mmol/L (ref 135–145)
TCO2: >50 mmol/L — ABNORMAL HIGH (ref 22–32)
pCO2 arterial: 94.8 mmHg (ref 32.0–48.0)
pH, Arterial: 7.331 — ABNORMAL LOW (ref 7.350–7.450)
pO2, Arterial: 63 mmHg — ABNORMAL LOW (ref 83.0–108.0)

## 2019-07-08 LAB — BLOOD GAS, ARTERIAL
Acid-Base Excess: 22.9 mmol/L — ABNORMAL HIGH (ref 0.0–2.0)
Acid-Base Excess: 22.1 mmol/L — ABNORMAL HIGH (ref 0.0–2.0)
Bicarbonate: 51.9 mmol/L — ABNORMAL HIGH (ref 20.0–28.0)
Bicarbonate: 50.5 mmol/L — ABNORMAL HIGH (ref 20.0–28.0)
Drawn by: 36529
Drawn by: 36529
FIO2: 36
FIO2: 100
O2 Saturation: 88.3 %
O2 Saturation: 97.9 %
Patient temperature: 37
Patient temperature: 37
pCO2 arterial: >120 mmHg (ref 32.0–48.0)
pCO2 arterial: >120 mmHg (ref 32.0–48.0)
pH, Arterial: 7.179 — CL (ref 7.350–7.450)
pH, Arterial: 7.223 — ABNORMAL LOW (ref 7.350–7.450)
pO2, Arterial: 60.3 mmHg — ABNORMAL LOW (ref 83.0–108.0)
pO2, Arterial: 113 mmHg — ABNORMAL HIGH (ref 83.0–108.0)

## 2019-07-08 LAB — CBC WITH DIFFERENTIAL/PLATELET
Abs Immature Granulocytes: 0.09 10*3/uL — ABNORMAL HIGH (ref 0.00–0.07)
Basophils Absolute: 0.1 10*3/uL (ref 0.0–0.1)
Basophils Relative: 1 %
Eosinophils Absolute: 0.1 10*3/uL (ref 0.0–0.5)
Eosinophils Relative: 1 %
HCT: 49.3 % — ABNORMAL HIGH (ref 36.0–46.0)
Hemoglobin: 13.7 g/dL (ref 12.0–15.0)
Immature Granulocytes: 1 %
Lymphocytes Relative: 32 %
Lymphs Abs: 2.5 10*3/uL (ref 0.7–4.0)
MCH: 30.7 pg (ref 26.0–34.0)
MCHC: 27.8 g/dL — ABNORMAL LOW (ref 30.0–36.0)
MCV: 110.5 fL — ABNORMAL HIGH (ref 80.0–100.0)
Monocytes Absolute: 0.6 10*3/uL (ref 0.1–1.0)
Monocytes Relative: 7 %
Neutro Abs: 4.4 10*3/uL (ref 1.7–7.7)
Neutrophils Relative %: 58 %
Platelets: 170 10*3/uL (ref 150–400)
RBC: 4.46 MIL/uL (ref 3.87–5.11)
RDW: 12 % (ref 11.5–15.5)
WBC: 7.7 10*3/uL (ref 4.0–10.5)
nRBC: 0 % (ref 0.0–0.2)

## 2019-07-08 LAB — COMPREHENSIVE METABOLIC PANEL
ALT: 14 U/L (ref 0–44)
AST: 25 U/L (ref 15–41)
Albumin: 3.4 g/dL — ABNORMAL LOW (ref 3.5–5.0)
Alkaline Phosphatase: 50 U/L (ref 38–126)
Anion gap: 8 (ref 5–15)
BUN: 8 mg/dL (ref 6–20)
CO2: 45 mmol/L — ABNORMAL HIGH (ref 22–32)
Calcium: 8.7 mg/dL — ABNORMAL LOW (ref 8.9–10.3)
Chloride: 94 mmol/L — ABNORMAL LOW (ref 98–111)
Creatinine, Ser: 0.43 mg/dL — ABNORMAL LOW (ref 0.44–1.00)
GFR calc Af Amer: >60 mL/min (ref >60)
GFR calc non Af Amer: >60 mL/min (ref >60)
Glucose, Bld: 108 mg/dL — ABNORMAL HIGH (ref 70–99)
Potassium: 4.2 mmol/L (ref 3.5–5.1)
Sodium: 147 mmol/L — ABNORMAL HIGH (ref 135–145)
Total Bilirubin: 1.1 mg/dL (ref 0.3–1.2)
Total Protein: 6.7 g/dL (ref 6.5–8.1)

## 2019-07-08 LAB — SARS CORONAVIRUS 2 BY RT PCR (HOSPITAL ORDER, PERFORMED IN ~~LOC~~ HOSPITAL LAB): SARS Coronavirus 2: NEGATIVE

## 2019-07-08 LAB — PHOSPHORUS: Phosphorus: 3.1 mg/dL (ref 2.5–4.6)

## 2019-07-08 LAB — MAGNESIUM: Magnesium: 2.2 mg/dL (ref 1.7–2.4)

## 2019-07-08 LAB — PROCALCITONIN: Procalcitonin: <0.1 ng/mL

## 2019-07-08 LAB — LACTIC ACID, PLASMA: Lactic Acid, Venous: 1.8 mmol/L (ref 0.5–1.9)

## 2019-07-08 MED ORDER — SODIUM CHLORIDE 0.9 % IV SOLN
500.0000 mg | Freq: Once | INTRAVENOUS | Status: AC
  Administered 2019-07-08: 500 mg via INTRAVENOUS
  Filled 2019-07-08 (×2): qty 500

## 2019-07-08 MED ORDER — IPRATROPIUM BROMIDE HFA 17 MCG/ACT IN AERS
4.0000 | INHALATION_SPRAY | Freq: Once | RESPIRATORY_TRACT | Status: AC
  Administered 2019-07-08: 4 via RESPIRATORY_TRACT
  Filled 2019-07-08: qty 12.9

## 2019-07-08 MED ORDER — IPRATROPIUM BROMIDE 0.02 % IN SOLN
1.5000 mg | Freq: Once | RESPIRATORY_TRACT | Status: AC
  Administered 2019-07-08: 1.5 mg via RESPIRATORY_TRACT
  Filled 2019-07-08: qty 7.5

## 2019-07-08 MED ORDER — PANTOPRAZOLE SODIUM 40 MG PO TBEC
40.0000 mg | DELAYED_RELEASE_TABLET | Freq: Two times a day (BID) | ORAL | Status: DC
  Administered 2019-07-08 – 2019-07-12 (×8): 40 mg via ORAL
  Filled 2019-07-08 (×8): qty 1

## 2019-07-08 MED ORDER — METHYLPREDNISOLONE SODIUM SUCC 125 MG IJ SOLR
60.0000 mg | Freq: Four times a day (QID) | INTRAMUSCULAR | Status: DC
  Administered 2019-07-08 – 2019-07-09 (×3): 60 mg via INTRAVENOUS
  Filled 2019-07-08 (×3): qty 2

## 2019-07-08 MED ORDER — METHYLPREDNISOLONE SODIUM SUCC 125 MG IJ SOLR: 125.0000 mg | Freq: Once | INTRAMUSCULAR | Status: DC

## 2019-07-08 MED ORDER — BUDESONIDE 0.5 MG/2ML IN SUSP
0.5000 mg | Freq: Two times a day (BID) | RESPIRATORY_TRACT | Status: DC
  Administered 2019-07-08: 0.5 mg via RESPIRATORY_TRACT
  Filled 2019-07-08 (×3): qty 2

## 2019-07-08 MED ORDER — FUROSEMIDE 10 MG/ML IJ SOLN
40.0000 mg | Freq: Once | INTRAMUSCULAR | Status: AC
  Administered 2019-07-08: 40 mg via INTRAVENOUS
  Filled 2019-07-08: qty 4

## 2019-07-08 MED ORDER — IPRATROPIUM-ALBUTEROL 0.5-2.5 (3) MG/3ML IN SOLN
3.0000 mL | Freq: Four times a day (QID) | RESPIRATORY_TRACT | Status: DC
  Administered 2019-07-08 – 2019-07-09 (×5): 3 mL via RESPIRATORY_TRACT
  Filled 2019-07-08 (×5): qty 3

## 2019-07-08 MED ORDER — HEPARIN SODIUM (PORCINE) 5000 UNIT/ML IJ SOLN
5000.0000 [IU] | Freq: Three times a day (TID) | INTRAMUSCULAR | Status: DC
  Administered 2019-07-08 – 2019-07-12 (×12): 5000 [IU] via SUBCUTANEOUS
  Filled 2019-07-08 (×12): qty 1

## 2019-07-08 MED ORDER — ALBUTEROL (5 MG/ML) CONTINUOUS INHALATION SOLN
10.0000 mg/h | INHALATION_SOLUTION | Freq: Once | RESPIRATORY_TRACT | Status: AC
  Administered 2019-07-08: 10 mg/h via RESPIRATORY_TRACT

## 2019-07-08 MED ORDER — ARFORMOTEROL TARTRATE 15 MCG/2ML IN NEBU
15.0000 ug | INHALATION_SOLUTION | Freq: Two times a day (BID) | RESPIRATORY_TRACT | Status: DC
  Administered 2019-07-08: 15 ug via RESPIRATORY_TRACT
  Filled 2019-07-08 (×3): qty 2

## 2019-07-08 MED ORDER — NICOTINE 21 MG/24HR TD PT24
21.0000 mg | MEDICATED_PATCH | Freq: Every day | TRANSDERMAL | Status: DC
  Administered 2019-07-08 – 2019-07-12 (×5): 21 mg via TRANSDERMAL
  Filled 2019-07-08 (×5): qty 1

## 2019-07-08 MED ORDER — POLYETHYLENE GLYCOL 3350 17 G PO PACK: 17.0000 g | PACK | Freq: Every day | ORAL | Status: DC | PRN

## 2019-07-08 MED ORDER — FUROSEMIDE 20 MG PO TABS
20.0000 mg | ORAL_TABLET | Freq: Every day | ORAL | Status: DC
  Administered 2019-07-09 – 2019-07-12 (×4): 20 mg via ORAL
  Filled 2019-07-08 (×4): qty 1

## 2019-07-08 MED ORDER — DOCUSATE SODIUM 100 MG PO CAPS: 100.0000 mg | ORAL_CAPSULE | Freq: Two times a day (BID) | ORAL | Status: DC | PRN

## 2019-07-08 NOTE — ED Notes (Signed)
Pt placed back on 6lp o2 via [place], SPO2 93%.

## 2019-07-08 NOTE — ED Provider Notes (Signed)
[facility] EMERGENCY DEPARTMENT Provider Note   CSN: [id number] Arrival date & time: 07/08/19  1216     History Chief Complaint  Patient presents with  . Shortness of Breath    Misty Green is a 49 y.o. female.  49 y.o female with a PMH of COPD, CHF, Opiate abuse presents to the ED via EMS with a chief complaint of shortness of breath x this morning. Patient reports symptoms began approximately 2 days ago, she then called EMS, had them evaluate her while at home but refused transportation at the time.  Reports today waking up and feeling "like I am in a daze ", she is currently on 4 L of oxygen at home for her COPD, was found to have oxygen saturation around 80% at her baseline O2.  Patient received Solu-Medrol, magnesium while in route to the ED.  She reports having a adductive cough with yellow sputum.  She is currently a cigarette smoker.  No recent vaccinations for COVID-19.  No fevers, chest pain, swelling to her feet.  The history is provided by the patient and medical records.  Shortness of Breath Severity:  Severe Onset quality:  Gradual Duration:  2 days Timing:  Constant Progression:  Worsening Chronicity:  Recurrent Context: smoke exposure   Associated symptoms: headaches   Associated symptoms: no abdominal pain, no chest pain, no cough, no fever, no sore throat and no vomiting        Past Medical History:  Diagnosis Date  . CHF (congestive heart failure) ([place])   . COPD (chronic obstructive pulmonary disease) ([place])   . Current smoker   . Opiate abuse, continuous [facility])     Patient Active Problem List   Diagnosis Date Noted  . Chronic fatigue 05/16/2019  . SOB (shortness of breath) 05/16/2019  . Seasonal allergic reaction 05/16/2019  . Anxiety disorder due to multiple medical problems 05/16/2019  . Edema 05/16/2019  . Encounter for screening mammogram for malignant neoplasm of breast 05/16/2019  . OSA (obstructive sleep apnea)   .  Noncompliance with CPAP treatment   . Chronic obstructive pulmonary disease ([place])   . Chest wall pain   . Essential hypertension   . Steroid-induced hyperglycemia   . Anemia of chronic disease   . AKI (acute kidney injury) ([place])   . Supplemental oxygen dependent   . Generalized anxiety disorder   . Hypoxic encephalopathy ([place]) 05/27/2018  . Anoxic brain injury ([place])   . ARF (acute renal failure) ([place])   . Lactic acidosis   . Transaminitis   . Acute on chronic respiratory failure with hypercapnia ([place])   . Cardiac arrest ([place]) 05/12/2018  . Diverticulitis of colon (without mention of hemorrhage)(562.11)   . Acute diverticulitis 01/06/2018  . Mixed anxiety and depressive disorder 12/22/2017  . Obesity 12/22/2017  . Tobacco user 12/22/2017  . Diverticulitis 12/03/2017  . Spinal stenosis of lumbar region 04/12/2016  . Acute respiratory failure ([place]) 03/22/2016  . COPD exacerbation ([place]) 03/22/2016  . Chronic back pain 03/22/2016  . Opioid type dependence, abuse ([place]) 03/22/2016  . Polycythemia 03/22/2016  . Anxiety 03/22/2016  . OSA on CPAP 03/22/2016  . Closed fracture of distal end of right radius 08/25/2015  . Closed nondisplaced fracture of styloid process of right ulna 08/25/2015    Past Surgical History:  Procedure Laterality Date  . ABDOMINAL HYSTERECTOMY    . BACK SURGERY    . cesction    . COLOSTOMY  01/13/2018   Procedure: COLOSTOMY  Creation;  Surgeon: Jules Husbands, MD;  Location: [facility] ORS;  Service: General;;  . INCISIONAL HERNIA REPAIR     lower midline laparotomy incision (hysterectomy), repaired with mesh  . IR FLUORO GUIDE CV LINE RIGHT  05/26/2018  . IR REMOVAL TUN CV CATH W/O FL  06/02/2018  . IR US GUIDE VASC ACCESS RIGHT  05/26/2018  . LAPAROSCOPIC CHOLECYSTECTOMY  2012  . LAPAROSCOPIC LYSIS OF ADHESIONS  01/13/2018   Procedure: LAPAROSCOPIC LYSIS OF ADHESIONS;  Surgeon: Jules Husbands, MD;  Location: [facility] ORS;  Service: General;;  . LAPAROSCOPIC SIGMOID  COLECTOMY  01/13/2018   Procedure: LAPAROSCOPIC SIGMOID COLECTOMY;  Surgeon: Jules Husbands, MD;  Location: [facility] ORS;  Service: General;;  . ORIF WRIST FRACTURE Right 08/25/2015   Procedure: OPEN REDUCTION INTERNAL FIXATION (ORIF) WRIST FRACTURE;  Surgeon: Corky Mull, MD;  Location: [facility] ORS;  Service: Orthopedics;  Laterality: Right;     OB History   No obstetric history on file.     Family History  Problem Relation Age of Onset  . COPD Mother   . Hypertension Father   . Diabetes Father     Social History   Tobacco Use  . Smoking status: Current Some Day Smoker    Years: 15.00    Types: Cigarettes  . Smokeless tobacco: Never Used  . Tobacco comment: 4 cigarettes a day   Substance Use Topics  . Alcohol use: Yes    Comment: ocassionally   . Drug use: Yes    Types: Marijuana    Comment: not everyday     Home Medications Prior to Admission medications   Medication Sig Start Date End Date Taking? Authorizing Provider  acetaminophen (TYLENOL) 325 MG tablet Take 1-2 tablets (325-650 mg total) by mouth every 4 (four) hours as needed for mild pain. 06/05/18   Love, Ivan Anchors, PA-C  albuterol (PROVENTIL HFA;VENTOLIN HFA) 108 (90 Base) MCG/ACT inhaler Inhale into the lungs every 6 (six) hours as needed for wheezing or shortness of breath.    [provider]  albuterol (PROVENTIL) (2.5 MG/3ML) 0.083% nebulizer solution Take 2.5 mg by nebulization. Inhale one vial via nebulizer 4 times a day as directed.    [provider]  ALPRAZolam Duanne Moron) 0.25 MG tablet Take 1 tablet (0.25 mg total) by mouth 3 (three) times daily. 07/04/19   Ronnell Freshwater, NP  amLODipine (NORVASC) 10 MG tablet Take 1 tablet (10 mg total) by mouth daily. 07/04/19   Boscia, Greer Ee, NP  [facility] MELATONIN 3 MG TABS TAKE 1 TABLET (3 MG TOTAL) BY MOUTH AT BEDTIME. USE IN PLACE OF Lorrin Mais 07/02/18   Sinda Du, MD  escitalopram (LEXAPRO) 20 MG tablet Take 1 tablet (20 mg total) by mouth daily. 04/25/19    Kendell Bane, NP  Fluticasone-Umeclidin-Vilant (TRELEGY ELLIPTA) 100-62.5-25 MCG/INH AEPB Inhale 1 each into the lungs daily. 06/21/19   Kendell Bane, NP  furosemide (LASIX) 20 MG tablet Take 1 tablet (20 mg total) by mouth daily. 05/04/19   Ronnell Freshwater, NP  gabapentin (NEURONTIN) 800 MG tablet Take 1 tablet (800 mg total) by mouth 3 (three) times daily. 07/04/19   Ronnell Freshwater, NP  methocarbamol (ROBAXIN) 500 MG tablet Take 2 tablets (1,000 mg total) by mouth 3 (three) times daily. 06/05/18   Love, Ivan Anchors, PA-C  montelukast (SINGULAIR) 10 MG tablet Take 1 tablet (10 mg total) by mouth at bedtime. 05/04/19   Ronnell Freshwater, NP  Multiple Vitamins-Minerals (MULTIVITAMIN WITH  MINERALS) tablet Take 1 tablet by mouth daily.    [provider]  pantoprazole (PROTONIX) 40 MG tablet Take 1 tablet (40 mg total) by mouth 2 (two) times daily. 04/02/19   Scarboro, Audie Clear, NP  polyethylene glycol (MIRALAX / GLYCOLAX) 17 g packet Take 17 g by mouth 2 (two) times daily. 06/05/18   Love, Ivan Anchors, PA-C  QUEtiapine (SEROQUEL) 50 MG tablet Take 1 tablet (50 mg total) by mouth at bedtime. 07/04/19   Ronnell Freshwater, NP  roflumilast (DALIRESP) 500 MCG TABS tablet Take 1 tablet (500 mcg total) by mouth daily. 07/04/19   Ronnell Freshwater, NP  amLODipine (NORVASC) 10 MG tablet Take 1 tablet (10 mg total) by mouth daily. 06/05/18   Love, Ivan Anchors, PA-C  Melatonin 3 MG TABS Take 1 tablet (3 mg total) by mouth at bedtime. Use in place of ambien 06/05/18   Love, Ivan Anchors, PA-C  pantoprazole (PROTONIX) 40 MG tablet Take 1 tablet (40 mg total) by mouth 2 (two) times daily. 06/05/18   Love, Ivan Anchors, PA-C    Allergies    Other  Review of Systems   Review of Systems  Constitutional: Negative for fever.  HENT: Negative for sore throat.   Respiratory: Positive for shortness of breath. Negative for cough.   Cardiovascular: Negative for chest pain and leg swelling.  Gastrointestinal: Negative for  abdominal pain, diarrhea and vomiting.  Genitourinary: Negative for flank pain.  Musculoskeletal: Negative for back pain.  Neurological: Positive for headaches.  All other systems reviewed and are negative.   Physical Exam Updated Vital Signs BP (!) 144/76   Pulse 95   Temp (!) 97.5 F (36.4 C) (Oral)   Resp (!) 23   SpO2 94%   Physical Exam Vitals and nursing note reviewed.  Constitutional:      Appearance: She is well-developed. She is ill-appearing and toxic-appearing.  HENT:     Head: Normocephalic and atraumatic.     Mouth/Throat:     Pharynx: No oropharyngeal exudate.  Cardiovascular:     Rate and Rhythm: Normal rate.     Comments: No BL pitting edema. No calf tenderness.  Pulmonary:     Effort: Tachypnea present. No respiratory distress.     Breath sounds: No stridor. Examination of the right-upper field reveals rales. Decreased breath sounds and rales present.  Chest:     Chest wall: No tenderness.  Abdominal:     Palpations: Abdomen is soft.     Tenderness: There is no abdominal tenderness.  Musculoskeletal:     Cervical back: Normal range of motion and neck supple.     Right lower leg: No edema.     Left lower leg: No edema.  Skin:    General: Skin is warm and dry.  Neurological:     Mental Status: She is alert and oriented to person, place, and time.     ED Results / Procedures / Treatments   Labs (all labs ordered are listed, but only abnormal results are displayed) Labs Reviewed  COMPREHENSIVE METABOLIC PANEL - Abnormal; Notable for the following components:      Result Value   Sodium 147 (*)    Chloride 94 (*)    CO2 45 (*)    Glucose, Bld 108 (*)    Creatinine, Ser 0.43 (*)    Calcium 8.7 (*)    Albumin 3.4 (*)    All other components within normal limits  CBC WITH DIFFERENTIAL/PLATELET - Abnormal; Notable for  the following components:   HCT 49.3 (*)    MCV 110.5 (*)    MCHC 27.8 (*)    Abs Immature Granulocytes 0.09 (*)    All other  components within normal limits  BLOOD GAS, ARTERIAL - Abnormal; Notable for the following components:   pH, Arterial 7.179 (*)    pCO2 arterial >120 (*)    pO2, Arterial 60.3 (*)    Bicarbonate 51.9 (*)    Acid-Base Excess 22.9 (*)    All other components within normal limits  SARS CORONAVIRUS 2 BY RT PCR (HOSPITAL ORDER, [facility])  RAPID URINE DRUG SCREEN, HOSP PERFORMED  URINALYSIS, ROUTINE W REFLEX MICROSCOPIC  MAGNESIUM  PHOSPHORUS  LACTIC ACID, PLASMA  BLOOD GAS, ARTERIAL    EKG EKG Interpretation  Date/Time:  Sunday July 08 2019 12:22:23 EDT Ventricular Rate:  85 PR Interval:    QRS Duration: 83 QT Interval:  365 QTC Calculation: 434 R Axis:   88 Text Interpretation: Sinus rhythm Biatrial enlargement Anteroseptal infarct, old No significant change since last tracing Confirmed by Gareth Morgan [id number]) on 07/08/2019 1:34:26 PM   Radiology DG Chest Portable 1 View  Result Date: 07/08/2019 CLINICAL DATA:  Shortness of breath. EXAM: PORTABLE CHEST 1 VIEW COMPARISON:  Jun 08, 2018 FINDINGS: Stable cardiomegaly. The hila and mediastinum are normal. No pneumothorax. No pulmonary nodules or masses. No focal infiltrates. Minimal opacity in the lateral left lung base is stable, likely atelectasis. IMPRESSION: Cardiomegaly. Mild atelectasis in the lateral left lung base. No other acute abnormalities. Electronically Signed   By: Dorise Bullion III M.D   On: 07/08/2019 13:26    Procedures .Critical Care Performed by: Janeece Fitting, PA-C Authorized by: Janeece Fitting, PA-C   Critical care provider statement:    Critical care time (minutes):  45   Critical care start time:  07/08/2019 2:00 PM   Critical care end time:  07/08/2019 2:45 PM   Critical care time was exclusive of:  Separately billable procedures and treating other patients   Critical care was necessary to treat or prevent imminent or life-threatening deterioration of the following  conditions:  Respiratory failure   Critical care was time spent personally by me on the following activities:  Blood draw for specimens, development of treatment plan with patient or surrogate, discussions with consultants, evaluation of patient's response to treatment, examination of patient, obtaining history from patient or surrogate, ordering and performing treatments and interventions, ordering and review of laboratory studies, ordering and review of radiographic studies, pulse oximetry, re-evaluation of patient's condition and review of old charts   (including critical care time)  Medications Ordered in ED Medications  budesonide (PULMICORT) nebulizer solution 0.5 mg (has no administration in time range)  arformoterol (BROVANA) nebulizer solution 15 mcg (has no administration in time range)  ipratropium-albuterol (DUONEB) 0.5-2.5 (3) MG/3ML nebulizer solution 3 mL (has no administration in time range)  methylPREDNISolone sodium succinate (SOLU-MEDROL) 125 mg/2 mL injection 60 mg (has no administration in time range)  azithromycin (ZITHROMAX) 500 mg in sodium chloride 0.9 % 250 mL IVPB (has no administration in time range)  ipratropium (ATROVENT HFA) inhaler 4 puff (4 puffs Inhalation Given 07/08/19 1245)  albuterol (PROVENTIL,VENTOLIN) solution continuous neb (10 mg/hr Nebulization Given 07/08/19 1314)  ipratropium (ATROVENT) nebulizer solution 1.5 mg (1.5 mg Nebulization Given 07/08/19 1314)    ED Course  I have reviewed the triage vital signs and the nursing notes.  Pertinent labs & imaging results that were available during  my care of the patient were reviewed by me and considered in my medical decision making (see chart for details).  Clinical Course as of Jul 07 1525  Sun Jul 08, 2019  1351 pCO2 arterial(!!): >120 [JS]  1353 pH, Arterial(!!): 7.179 [JS]  1518 SARS Coronavirus 2: NEGATIVE [JS]    Clinical Course User Index [JS] Janeece Fitting, PA-C   MDM  Rules/Calculators/A&P   Patient with a past medical history of CHF, COPD presents to the ED with worsening shortness of breath for the past 2 days.  However, she refused transportation into the ED 2 days ago as she was spending time with her granddaughter.  Patient arrived in the ED with oxygen saturations in the upper 80% with a 6 L [place]. She is tachypnea with RR around upper 20-30. She is disoriented at baseline, neuro exam without any focal deficit. ABG was obtained after patient's arrival vitals along with mental status.   She did receive prednisolone, Mg+ by EMS.  Continues to have work of breathing, discussed BiPAP treatment with patient.  I have discussed case with my attending Dr. Billy Fischer.  Initial ABG was obtained prior to Bipap placement remarkable, which showed: PH of 7.19 PCO2 of >120  Significant elevation of baseline in the 60 range.  CMP with slight elevation in her sodium, creatinine level with decreased.  LFTs are within normal limits.  Her labs for comparison are from over a year ago.  CBC without any leukocytosis.hemlgobin is within normal limits. COVID 19 IS NEGATIVE, no recent vaccinations. She has received azithromycin for COPD exacerbation.  Xray of her chest showed:  Cardiomegaly. Mild atelectasis in the lateral left lung base. No  other acute abnormalities.     No BL pitting edema, no signs of pulmonary edema on xray lower suspicion for CHF component.   2:40 PM Spoke to critical care [facility] APP, who recommended repeat ABG in 1 hour after continuous BiPAP, she will be evaluated by them.  3:07 PM Call placed to RT for repeat ABG as patient has been on bipap for over 1 hour.   Patient's plan is to have ABG rechecked, will be seen by critical care while in the hospital.  Disposition will be determined on changes with blood gas, if improvement likely will go to hospitalist service, otherwise will be admitted by intensivist.  Patient care signed out to Deno Etienne  PA pending disposition.   Portions of this note were generated with [employer]. Dictation errors may occur despite best attempts at proofreading.  Final Clinical Impression(s) / ED Diagnoses Final diagnoses:  SOB (shortness of breath)  Hypoxia  COPD exacerbation [facility])    Rx / DC Orders ED Discharge Orders    None       Janeece Fitting, PA-C 07/08/19 1527    Gareth Morgan, MD 07/08/19 2208

## 2019-07-08 NOTE — ED Triage Notes (Signed)
Pt arrives to ED w/ c/o SOB, dizziness, lethargy. Pt has hx of COPD, wears 4 lpm o2 via [place] at baseline. Pt 80% on baseline o2 w/ EMS, pt placed on 6 lpm and pt up to 90%. Pt received 125 mg solumedrol, 2g Magnesium w/ EMS. Pt has hx of cardiac arrest, pt denies chest pain today.

## 2019-07-08 NOTE — Progress Notes (Signed)
Patient taken off BIPAP and placedon 5L [place]. Vital signs are stable. No distress noted. RT will continue to monitor.

## 2019-07-08 NOTE — ED Notes (Signed)
Pt 71% on 6lpm O2, placed on NRB and up to 90%.

## 2019-07-08 NOTE — H&P (Addendum)
NAME:  Misty Green, MRN:  [id number], DOB:  12/24/1970, LOS: 0 ADMISSION DATE:  07/08/2019, CONSULTATION DATE:  07/08/2019 REFERRING MD:  Irene Pap, PA , CHIEF COMPLAINT:  Hypercarbic Respiratory Failure    History of present illness   49 year old female presents to ED on 6/20 with progressive lethargy since Friday. When EMS arrived patient oxygen saturation was 80% on 4L [place]. Given 2 grams mag and 125 mg Solu-Medrol. On arrival to ED patient is tachypneic with decreased breath sounds, and reported rales. CXR with Cardiomegaly and mild atelectasis in left lateral lung. ABG 7.179/>120/60.3. Placed on BiPAP. Critical Care Consulted for admission.   Patient reports she typically smokes 4-5 cigarettes per day, however has been under increased stress lately due to adult child and grandchildren moving in. For the last month has smoked on average 1ppd. Patient has a known H/O of COPD and OSA. At baseline wears 4L [place] and at night BiPAP (unsure of settings) with 4L. Has a chronic productive cough with yellow sputum (this has not changed over the last few years). Follows at [facility] in [place]. Reports she is compliant with BiPAP and Oxygen Therapy. Husband reports that since Friday patient has been progressively confused, tired, and sleepy.    Past Medical History  COPD, OSA on BiPAP at HS, GERD, Anxiety   Significant Hospital Events   6/20 > Presents to ED   Consults:  PCCM  Procedures:  N/A  Significant Diagnostic Tests:  CXR 6/20 > Cardiomegaly, mild atelectasis in left lateral lung.  Micro Data:  Sputum 6/20 >   Antimicrobials:  Azithromycin 6/20   Interim history/subjective:  As above   Objective   Blood pressure 119/86, pulse 85, temperature (!) 97.5 F (36.4 C), temperature source Oral, resp. rate 17, SpO2 95 %.    Vent Mode: BIPAP;PCV FiO2 (%):  [50 %] 50 % Set Rate:  [16 bmp] 16 bmp PEEP:  [5 cmH20] 5 cmH20 Plateau Pressure:  [10 cmH20] 10 cmH20  No intake or output  data in the 24 hours ending 07/08/19 1740 There were no vitals filed for this visit.  Examination: General: adult female, no distress  HENT: BiPAP in place  Lungs: Diminished breath sounds, no crackles/wheeze Cardiovascular: RRR, no MRG Abdomen: Soft, Obese, active bowel sounds, non-tender, Colostomy in place   Extremities: -edema  Neuro: alert, follows commands, oriented  GU: intact   Resolved Hospital Problem list     Assessment & Plan:   Acute on Chronic Hypoxic/Hypercarbic Respiratory Failure in setting of OSA and AECOPD, +/- Polypharmacy (on multiple sedating medications)  Severe Respiratory Acidosis  -COVID Negative, Increase Tobacco Use -Followed by [facility]  Plan -Scheduled Nebs and Steroids  -BIPAP continuous for now, then at at [facility]  -Encourage good pulmonary hygiene  -Titrate Supplemental Oxygen to achieve Saturation Goal >88 -Nicotine Patch per Patient request  -May need titration of home BiPAP prescription  -Send BNP (Previous ECHO normal function, does not clinically appear volume overloaded) and PCT   GERD Plan  -PPI BID (per Home Order)   Anxiety, Insomnia  Plan -Hold all sedating medication at this time   H/O Diverticulitis s/p Hartmann's with colostomy   Best practice:  Diet: NPO >> Will advance once respiratory status has improved  Pain/Anxiety/Delirium protocol (if indicated): N/A VAP protocol (if indicated): N/A DVT prophylaxis: Heparin sq  GI prophylaxis: PPI Glucose control: Trend Glucose  Mobility: OOB to Chair when medically stable  Code Status: Full Code Family Communication: Husband updated  at bedside.  Disposition: Admit to ICU   Labs   CBC: Recent Labs  Lab 07/08/19 1241  WBC 7.7  NEUTROABS 4.4  HGB 13.7  HCT 49.3*  MCV 110.5*  PLT 361    Basic Metabolic Panel: Recent Labs  Lab 07/08/19 1241 07/08/19 1508  NA 147*  --   K 4.2  --   CL 94*  --   CO2 45*  --   GLUCOSE 108*  --   BUN 8  --   CREATININE 0.43*   --   CALCIUM 8.7*  --   MG  --  2.2  PHOS  --  3.1   GFR: CrCl cannot be calculated (Unknown ideal weight.). Recent Labs  Lab 07/08/19 1241 07/08/19 1508  WBC 7.7  --   LATICACIDVEN  --  1.8    Liver Function Tests: Recent Labs  Lab 07/08/19 1241  AST 25  ALT 14  ALKPHOS 50  BILITOT 1.1  PROT 6.7  ALBUMIN 3.4*   No results for input(s): LIPASE, AMYLASE in the last 168 hours. No results for input(s): AMMONIA in the last 168 hours.  ABG    Component Value Date/Time   PHART 7.223 (L) 07/08/2019 1553   PCO2ART >120.0 (HH) 07/08/2019 1553   PO2ART 113 (H) 07/08/2019 1553   HCO3 50.5 (H) 07/08/2019 1553   TCO2 45 (H) 06/09/2018 0029   ACIDBASEDEF 1.1 05/20/2018 1240   O2SAT 97.9 07/08/2019 1553     Coagulation Profile: No results for input(s): INR, PROTIME in the last 168 hours.  Cardiac Enzymes: No results for input(s): CKTOTAL, CKMB, CKMBINDEX, TROPONINI in the last 168 hours.  HbA1C: No results found for: HGBA1C  CBG: No results for input(s): GLUCAP in the last 168 hours.  Review of Systems:   Review of Systems  Constitutional: Negative for chills and fever.  Respiratory: Positive for cough, sputum production, shortness of breath and wheezing.   Gastrointestinal: Negative for abdominal pain, heartburn, nausea and vomiting.  Psychiatric/Behavioral: Negative for depression.     Past Medical History  She,  has a past medical history of CHF (congestive heart failure) ([place]), COPD (chronic obstructive pulmonary disease) ([place]), Current smoker, and Opiate abuse, continuous ([place]).   Surgical History    Past Surgical History:  Procedure Laterality Date  . ABDOMINAL HYSTERECTOMY    . BACK SURGERY    . cesction    . COLOSTOMY  01/13/2018   Procedure: COLOSTOMY Creation;  Surgeon: Jules Husbands, MD;  Location: [facility] ORS;  Service: General;;  . INCISIONAL HERNIA REPAIR     lower midline laparotomy incision (hysterectomy), repaired with mesh  . IR FLUORO  GUIDE CV LINE RIGHT  05/26/2018  . IR REMOVAL TUN CV CATH W/O FL  06/02/2018  . IR US GUIDE VASC ACCESS RIGHT  05/26/2018  . LAPAROSCOPIC CHOLECYSTECTOMY  2012  . LAPAROSCOPIC LYSIS OF ADHESIONS  01/13/2018   Procedure: LAPAROSCOPIC LYSIS OF ADHESIONS;  Surgeon: Jules Husbands, MD;  Location: [facility] ORS;  Service: General;;  . LAPAROSCOPIC SIGMOID COLECTOMY  01/13/2018   Procedure: LAPAROSCOPIC SIGMOID COLECTOMY;  Surgeon: Jules Husbands, MD;  Location: [facility] ORS;  Service: General;;  . ORIF WRIST FRACTURE Right 08/25/2015   Procedure: OPEN REDUCTION INTERNAL FIXATION (ORIF) WRIST FRACTURE;  Surgeon: Corky Mull, MD;  Location: [facility] ORS;  Service: Orthopedics;  Laterality: Right;     Social History   reports that she has been smoking cigarettes. She has smoked for the past 15.00 years. She  has never used smokeless tobacco. She reports current alcohol use. She reports current drug use. Drug: Marijuana.   Family History   Her family history includes COPD in her mother; Diabetes in her father; Hypertension in her father.   Allergies Allergies  Allergen Reactions  . Other Nausea And Vomiting    Anti-depressent that starts with t     Home Medications  Prior to Admission medications   Medication Sig Start Date End Date Taking? Authorizing Provider  acetaminophen (TYLENOL) 325 MG tablet Take 1-2 tablets (325-650 mg total) by mouth every 4 (four) hours as needed for mild pain. 06/05/18   Love, Ivan Anchors, PA-C  albuterol (PROVENTIL HFA;VENTOLIN HFA) 108 (90 Base) MCG/ACT inhaler Inhale into the lungs every 6 (six) hours as needed for wheezing or shortness of breath.    [provider]  albuterol (PROVENTIL) (2.5 MG/3ML) 0.083% nebulizer solution Take 2.5 mg by nebulization. Inhale one vial via nebulizer 4 times a day as directed.    [provider]  ALPRAZolam Duanne Moron) 0.25 MG tablet Take 1 tablet (0.25 mg total) by mouth 3 (three) times daily. 07/04/19   Ronnell Freshwater, NP  amLODipine  (NORVASC) 10 MG tablet Take 1 tablet (10 mg total) by mouth daily. 07/04/19   Boscia, Greer Ee, NP  [facility] MELATONIN 3 MG TABS TAKE 1 TABLET (3 MG TOTAL) BY MOUTH AT BEDTIME. USE IN PLACE OF Lorrin Mais 07/02/18   Sinda Du, MD  escitalopram (LEXAPRO) 20 MG tablet Take 1 tablet (20 mg total) by mouth daily. 04/25/19   Kendell Bane, NP  Fluticasone-Umeclidin-Vilant (TRELEGY ELLIPTA) 100-62.5-25 MCG/INH AEPB Inhale 1 each into the lungs daily. 06/21/19   Kendell Bane, NP  furosemide (LASIX) 20 MG tablet Take 1 tablet (20 mg total) by mouth daily. 05/04/19   Ronnell Freshwater, NP  gabapentin (NEURONTIN) 800 MG tablet Take 1 tablet (800 mg total) by mouth 3 (three) times daily. 07/04/19   Ronnell Freshwater, NP  methocarbamol (ROBAXIN) 500 MG tablet Take 2 tablets (1,000 mg total) by mouth 3 (three) times daily. 06/05/18   Love, Ivan Anchors, PA-C  montelukast (SINGULAIR) 10 MG tablet Take 1 tablet (10 mg total) by mouth at bedtime. 05/04/19   Ronnell Freshwater, NP  Multiple Vitamins-Minerals (MULTIVITAMIN WITH MINERALS) tablet Take 1 tablet by mouth daily.    [provider]  pantoprazole (PROTONIX) 40 MG tablet Take 1 tablet (40 mg total) by mouth 2 (two) times daily. 04/02/19   Scarboro, Audie Clear, NP  polyethylene glycol (MIRALAX / GLYCOLAX) 17 g packet Take 17 g by mouth 2 (two) times daily. 06/05/18   Love, Ivan Anchors, PA-C  QUEtiapine (SEROQUEL) 50 MG tablet Take 1 tablet (50 mg total) by mouth at bedtime. 07/04/19   Ronnell Freshwater, NP  roflumilast (DALIRESP) 500 MCG TABS tablet Take 1 tablet (500 mcg total) by mouth daily. 07/04/19   Ronnell Freshwater, NP  amLODipine (NORVASC) 10 MG tablet Take 1 tablet (10 mg total) by mouth daily. 06/05/18   Love, Ivan Anchors, PA-C  Melatonin 3 MG TABS Take 1 tablet (3 mg total) by mouth at bedtime. Use in place of ambien 06/05/18   Love, Ivan Anchors, PA-C  pantoprazole (PROTONIX) 40 MG tablet Take 1 tablet (40 mg total) by mouth 2 (two) times daily. 06/05/18   Bary Leriche, PA-C     Critical care time: 42 minutes     Hayden Pedro, AGACNP-BC [facility]  PCCM  Pgr: [phone]

## 2019-07-08 NOTE — ED Provider Notes (Signed)
Patient received during handoff from [facility] to [place] PA-C please see her note for HPI. Physical Exam  BP (!) 144/76   Pulse 95   Temp (!) 97.5 F (36.4 C) (Oral)   Resp (!) 23   SpO2 94%   Physical Exam Vitals and nursing note reviewed.  Constitutional:      General: She is in acute distress.     Appearance: Normal appearance. She is ill-appearing. She is not diaphoretic.  HENT:     Head: Normocephalic and atraumatic.     Nose: No congestion or rhinorrhea.  Eyes:     General: No scleral icterus.       Right eye: No discharge.        Left eye: No discharge.     Conjunctiva/sclera: Conjunctivae normal.  Cardiovascular:     Rate and Rhythm: Normal rate and regular rhythm.  Pulmonary:     Effort: Pulmonary effort is normal. No respiratory distress.     Breath sounds: Wheezing present.     Comments: Good rise and fall of chest.  Lung sounds were auscultated she had an decreased breath sounds bilaterally with occasional wheezing upon the lower lobes, no rales or rhonchi is heard. Musculoskeletal:     Cervical back: Neck supple.     Right lower leg: Edema present.     Left lower leg: Edema present.  Skin:    General: Skin is warm and dry.     Coloration: Skin is not jaundiced or pale.  Neurological:     Mental Status: She is alert and oriented to person, place, and time.  Psychiatric:        Mood and Affect: Mood normal.     ED Course/Procedures   Clinical Course as of Jul 07 1524  Sun Jul 08, 2019  1351 pCO2 arterial(!!): >120 [JS]  1353 pH, Arterial(!!): 7.179 [JS]  1518 SARS Coronavirus 2: NEGATIVE [JS]    Clinical Course User Index [JS] Janeece Fitting, PA-C    Procedures  MDM   I have personally reviewed all imaging, labs and have interpreted them.  Patient was worked up for COPD exacerbation versus congestive heart failure versus pneumonia.  Unlikely that patient suffering from CHF exacerbation as chest x-ray did not show  signs of edema but did show mild  atelectasis, no consolidation, infiltration, or other acute abnormalities noted.  Unlikely patient has pneumonia as chest x-ray was unremarkable, she does not have an elevated white count, Covid test was negative.  CMP showed elevated sodium, slightly decreased chloride, elevated CO2, with creatinine and calcium at baseline for patient.  Blood gas was drawn showing pH of 7.1 and a PCO2 of 120 making hypercapnia secondary to COPD exacerbation likely.  Soto Alesia Morin consulted with critical care and will come down to evaluate the patient for possible admission.  If she does not meet criteria for critical care admission, will consult to hospitalist for admission.    Patient was started on antibiotics, given steroids and placed on CPAP.  Critical care evaluated patient and has accepted the patient for admission.  Patient will be under the care of Dr.Ravi agarwala and will continue with her care and management.     Marcello Fennel, PA-C 07/08/19 1739    Valarie Merino, MD 07/12/19 1124

## 2019-07-08 NOTE — ED Notes (Signed)
PAGED CCM TO RN BRITTANY BECK--Misty Green

## 2019-07-09 ENCOUNTER — Ambulatory Visit: Payer: 59 | Admitting: Internal Medicine

## 2019-07-09 DIAGNOSIS — J9622 Acute and chronic respiratory failure with hypercapnia: Secondary | ICD-10-CM

## 2019-07-09 DIAGNOSIS — J441 Chronic obstructive pulmonary disease with (acute) exacerbation: Secondary | ICD-10-CM

## 2019-07-09 LAB — CBC
HCT: 46 % (ref 36.0–46.0)
Hemoglobin: 13 g/dL (ref 12.0–15.0)
MCH: 30.2 pg (ref 26.0–34.0)
MCHC: 28.3 g/dL — ABNORMAL LOW (ref 30.0–36.0)
MCV: 106.7 fL — ABNORMAL HIGH (ref 80.0–100.0)
Platelets: 171 10*3/uL (ref 150–400)
RBC: 4.31 MIL/uL (ref 3.87–5.11)
RDW: 11.9 % (ref 11.5–15.5)
WBC: 5.9 10*3/uL (ref 4.0–10.5)
nRBC: 0 % (ref 0.0–0.2)

## 2019-07-09 LAB — I-STAT ARTERIAL BLOOD GAS, ED
Acid-Base Excess: 24 mmol/L — ABNORMAL HIGH (ref 0.0–2.0)
Bicarbonate: 53.1 mmol/L — ABNORMAL HIGH (ref 20.0–28.0)
Calcium, Ion: 1.09 mmol/L — ABNORMAL LOW (ref 1.15–1.40)
HCT: 43 % (ref 36.0–46.0)
Hemoglobin: 14.6 g/dL (ref 12.0–15.0)
O2 Saturation: 94 %
Patient temperature: 98
Potassium: 3.2 mmol/L — ABNORMAL LOW (ref 3.5–5.1)
Sodium: 140 mmol/L (ref 135–145)
TCO2: >50 mmol/L — ABNORMAL HIGH (ref 22–32)
pCO2 arterial: 70.1 mmHg (ref 32.0–48.0)
pH, Arterial: 7.486 — ABNORMAL HIGH (ref 7.350–7.450)
pO2, Arterial: 67 mmHg — ABNORMAL LOW (ref 83.0–108.0)

## 2019-07-09 LAB — BASIC METABOLIC PANEL
Anion gap: 11 (ref 5–15)
BUN: 7 mg/dL (ref 6–20)
CO2: 43 mmol/L — ABNORMAL HIGH (ref 22–32)
Calcium: 8.8 mg/dL — ABNORMAL LOW (ref 8.9–10.3)
Chloride: 89 mmol/L — ABNORMAL LOW (ref 98–111)
Creatinine, Ser: 0.41 mg/dL — ABNORMAL LOW (ref 0.44–1.00)
GFR calc Af Amer: >60 mL/min (ref >60)
GFR calc non Af Amer: >60 mL/min (ref >60)
Glucose, Bld: 106 mg/dL — ABNORMAL HIGH (ref 70–99)
Potassium: 3.5 mmol/L (ref 3.5–5.1)
Sodium: 143 mmol/L (ref 135–145)

## 2019-07-09 LAB — RAPID URINE DRUG SCREEN, HOSP PERFORMED
Amphetamines: NOT DETECTED
Barbiturates: NOT DETECTED
Benzodiazepines: POSITIVE — AB
Cocaine: NOT DETECTED
Opiates: NOT DETECTED
Tetrahydrocannabinol: POSITIVE — AB

## 2019-07-09 LAB — URINALYSIS, ROUTINE W REFLEX MICROSCOPIC
Bacteria, UA: NONE SEEN
Bilirubin Urine: NEGATIVE
Glucose, UA: NEGATIVE mg/dL
Ketones, ur: NEGATIVE mg/dL
Leukocytes,Ua: NEGATIVE
Nitrite: NEGATIVE
Protein, ur: 100 mg/dL — AB
Specific Gravity, Urine: 1.005 (ref 1.005–1.030)
pH: 5 (ref 5.0–8.0)

## 2019-07-09 LAB — BRAIN NATRIURETIC PEPTIDE: B Natriuretic Peptide: 51.8 pg/mL (ref 0.0–100.0)

## 2019-07-09 LAB — PHOSPHORUS: Phosphorus: 2.8 mg/dL (ref 2.5–4.6)

## 2019-07-09 LAB — MAGNESIUM: Magnesium: 2.1 mg/dL (ref 1.7–2.4)

## 2019-07-09 MED ORDER — ONDANSETRON HCL 4 MG PO TABS
4.0000 mg | ORAL_TABLET | Freq: Three times a day (TID) | ORAL | Status: DC | PRN
  Filled 2019-07-09: qty 1

## 2019-07-09 MED ORDER — ROFLUMILAST 500 MCG PO TABS
500.0000 ug | ORAL_TABLET | Freq: Every day | ORAL | Status: DC
  Administered 2019-07-09 – 2019-07-12 (×4): 500 ug via ORAL
  Filled 2019-07-09 (×5): qty 1

## 2019-07-09 MED ORDER — PREDNISONE 20 MG PO TABS
40.0000 mg | ORAL_TABLET | Freq: Every day | ORAL | Status: DC
  Administered 2019-07-10 – 2019-07-12 (×3): 40 mg via ORAL
  Filled 2019-07-09 (×3): qty 2

## 2019-07-09 MED ORDER — POTASSIUM CHLORIDE CRYS ER 20 MEQ PO TBCR
40.0000 meq | EXTENDED_RELEASE_TABLET | Freq: Once | ORAL | Status: AC
  Administered 2019-07-09: 40 meq via ORAL
  Filled 2019-07-09: qty 2

## 2019-07-09 MED ORDER — FLUTICASONE FUROATE-VILANTEROL 100-25 MCG/INH IN AEPB
1.0000 | INHALATION_SPRAY | Freq: Every day | RESPIRATORY_TRACT | Status: DC
  Administered 2019-07-10 – 2019-07-12 (×3): 1 via RESPIRATORY_TRACT
  Filled 2019-07-09: qty 28

## 2019-07-09 MED ORDER — UMECLIDINIUM BROMIDE 62.5 MCG/INH IN AEPB
1.0000 | INHALATION_SPRAY | Freq: Every day | RESPIRATORY_TRACT | Status: DC
  Administered 2019-07-10 – 2019-07-12 (×3): 1 via RESPIRATORY_TRACT
  Filled 2019-07-09: qty 7

## 2019-07-09 MED ORDER — GABAPENTIN 100 MG PO CAPS
400.0000 mg | ORAL_CAPSULE | Freq: Three times a day (TID) | ORAL | Status: DC
  Administered 2019-07-09 – 2019-07-12 (×10): 400 mg via ORAL
  Filled 2019-07-09 (×10): qty 4

## 2019-07-09 MED ORDER — HYDRALAZINE HCL 20 MG/ML IJ SOLN: 5.0000 mg | Freq: Four times a day (QID) | INTRAMUSCULAR | Status: DC | PRN

## 2019-07-09 MED ORDER — MELATONIN 5 MG PO TABS
10.0000 mg | ORAL_TABLET | Freq: Every day | ORAL | Status: DC
  Administered 2019-07-09 – 2019-07-11 (×3): 10 mg via ORAL
  Filled 2019-07-09 (×3): qty 2

## 2019-07-09 MED ORDER — QUETIAPINE FUMARATE 50 MG PO TABS
50.0000 mg | ORAL_TABLET | Freq: Every day | ORAL | Status: DC
  Administered 2019-07-09 – 2019-07-11 (×4): 50 mg via ORAL
  Filled 2019-07-09 (×6): qty 1

## 2019-07-09 MED ORDER — FLUTICASONE-UMECLIDIN-VILANT 100-62.5-25 MCG/INH IN AEPB: 1.0000 | INHALATION_SPRAY | Freq: Every day | RESPIRATORY_TRACT | Status: DC

## 2019-07-09 MED ORDER — ESCITALOPRAM OXALATE 20 MG PO TABS
20.0000 mg | ORAL_TABLET | Freq: Every day | ORAL | Status: DC
  Administered 2019-07-09 – 2019-07-12 (×4): 20 mg via ORAL
  Filled 2019-07-09 (×2): qty 1
  Filled 2019-07-09: qty 2
  Filled 2019-07-09: qty 1

## 2019-07-09 MED ORDER — ALPRAZOLAM 0.25 MG PO TABS
0.2500 mg | ORAL_TABLET | Freq: Every evening | ORAL | Status: DC | PRN
  Administered 2019-07-09 – 2019-07-11 (×5): 0.25 mg via ORAL
  Filled 2019-07-09 (×5): qty 1

## 2019-07-09 MED ORDER — AMLODIPINE BESYLATE 10 MG PO TABS
10.0000 mg | ORAL_TABLET | Freq: Every day | ORAL | Status: DC
  Administered 2019-07-09 – 2019-07-12 (×4): 10 mg via ORAL
  Filled 2019-07-09 (×2): qty 1
  Filled 2019-07-09: qty 2
  Filled 2019-07-09: qty 1

## 2019-07-09 NOTE — Progress Notes (Signed)
PCCM Interval progress note:  Pt has been in the ED all night and is improving on Bipap, asked to evaluate whether patient is appropriate for step-down status instead of ICU.   On evaluation, pt is awake and alert and conversive, tolerating bipap mask well and moving good air on lung exam bilaterally.  ABG with improving hypercapnia and pH 7.4.    Will change status to progressive, PCCM will continue to be primary at this time.    Otilio Carpen Kaden Daughdrill, PA-C

## 2019-07-09 NOTE — ED Notes (Signed)
Patent is getting very anxious  About her medications all meds are PO MD messaged patient is on Bipap

## 2019-07-09 NOTE — ED Notes (Signed)
Hospital bed ordered.

## 2019-07-09 NOTE — ED Notes (Signed)
Spoke with CCM again, pt can have Xanax as long as she is willing to wear the Bi-pap the rest of the night. This RN discussed with CCM that I would have that conversation with the pt before agreeing to give the medication.

## 2019-07-09 NOTE — Progress Notes (Addendum)
NAME:  Misty Green, MRN:  [id number], DOB:  07/24/1970, LOS: 1 ADMISSION DATE:  07/08/2019, CONSULTATION DATE:  07/08/2019 REFERRING MD:  Irene Pap, PA , CHIEF COMPLAINT:  Hypercarbic Respiratory Failure    History of present illness   49 year old female presents to ED on 6/20 with progressive lethargy since Friday. When EMS arrived patient oxygen saturation was 80% on 4L [place]. Given 2 grams mag and 125 mg Solu-Medrol. On arrival to ED patient is tachypneic with decreased breath sounds, and reported rales. CXR with Cardiomegaly and mild atelectasis in left lateral lung. ABG 7.179/>120/60.3. Placed on BiPAP. Critical Care Consulted for admission.   Patient reports she typically smokes 4-5 cigarettes per day, however has been under increased stress lately due to adult child and grandchildren moving in. For the last month has smoked on average 1ppd. Patient has a known H/O of COPD and OSA. At baseline wears 4L [place] and at night BiPAP (unsure of settings) with 4L. Has a chronic productive cough with yellow sputum (this has not changed over the last few years). Follows at [facility] in [place]. Reports she is compliant with BiPAP and Oxygen Therapy. Husband reports that since Friday patient has been progressively confused, tired, and sleepy.    Past Medical History  COPD, OSA on BiPAP at HS, GERD, Anxiety   Significant Hospital Events   6/20 > Presents to ED   Consults:  PCCM  Procedures:  N/A  Significant Diagnostic Tests:  CXR 6/20 > Cardiomegaly, mild atelectasis in left lateral lung.  Micro Data:  Sputum 6/20 >   Antimicrobials:  Azithromycin 6/20   Interim history/subjective:  Overnight with improvement of CO2 and mentation. Patient upset this AM regarding medication regimen. Denies pain. Denies shortness of breath.   Objective   Blood pressure 119/70, pulse 82, temperature (!) 97.5 F (36.4 C), temperature source Oral, resp. rate 18, SpO2 97 %.    Vent Mode: PCV FiO2 (%):   [40 %-50 %] 40 % Set Rate:  [15 bmp-16 bmp] 15 bmp PEEP:  [5 cmH20] 5 cmH20 Plateau Pressure:  [10 cmH20-16 cmH20] 16 cmH20   Intake/Output Summary (Last 24 hours) at 07/09/2019 1126 Last data filed at 07/08/2019 1917 Gross per 24 hour  Intake --  Output 1200 ml  Net -1200 ml   There were no vitals filed for this visit.  Examination: General: adult female, no distress  HENT: on nasal cannula, dry MM  Lungs: Diminished breath sounds, no crackles/wheeze Cardiovascular: RRR, no MRG Abdomen: Soft, Obese, active bowel sounds, non-tender, Colostomy in place   Extremities: -edema  Neuro: alert, follows commands, oriented  GU: intact   Resolved Hospital Problem list     Assessment & Plan:   Acute on Chronic Hypoxic/Hypercarbic Respiratory Failure in setting of OSA and AECOPD, +/- Polypharmacy (on multiple sedating medications) >> High Suspicion for Polypharmacy. After Speaking to patient with husband at bedside it is clear she is mistaking medication. Does not seem purposeful however she has been taking melatonin tabs and gummies without knowing they were both the same thing, also taking twice as many tabs for xanax at night, and during the day she is sure she takes 7 different medications however after reviewing list she is only prescribed 6. Husband is willing to be more involved with med administration. Of note UDS +THC as well  Severe Respiratory Acidosis  -COVID Negative, Increase Tobacco Use -Followed by [facility]  Plan -Scheduled Nebs and Steroids (changed steriods to PO 5 day  course)  -BIPAP HS and PRN -Encourage good pulmonary hygiene  -Titrate Supplemental Oxygen to achieve Saturation Goal >88 (on 4L [place] at baseline)  -Nicotine Patch  -May need titration of home BiPAP prescription   GERD Plan  -PPI BID (per Home Order)   Anxiety, Insomnia  Plan -Restarted home Lexapro and Seroquel  -Restarted home Xanax (patient states she takes 2 tabs at HS however it is ordered  as one tab 3 times daily, have spoken to patient regarding this)  Neuropathy, Spinal Pain  Plan -Restarted home Neurontin at half dose -Old and D/C home Robaxin  H/O Diverticulitis s/p Hartmann's with colostomy   Best practice:  Diet: NPO >> Will advance once respiratory status has improved  Pain/Anxiety/Delirium protocol (if indicated): N/A VAP protocol (if indicated): N/A DVT prophylaxis: Heparin sq  GI prophylaxis: PPI Glucose control: Trend Glucose  Mobility: OOB to Chair when medically stable  Code Status: Full Code Family Communication: Husband updated at bedside.  Disposition: Will Transfer to [facility] service and to med-bed. PCCM to sign off 6/22. Have asked patient and husband to ensure re-scheduling of there pulmonary appointment.    Labs   CBC: Recent Labs  Lab 07/08/19 1241 07/08/19 2210 07/09/19 0306 07/09/19 0528  WBC 7.7  --  5.9  --   NEUTROABS 4.4  --   --   --   HGB 13.7 15.0 13.0 14.6  HCT 49.3* 44.0 46.0 43.0  MCV 110.5*  --  106.7*  --   PLT 170  --  171  --     Basic Metabolic Panel: Recent Labs  Lab 07/08/19 1241 07/08/19 1508 07/08/19 2210 07/09/19 0306 07/09/19 0528  NA 147*  --  140 143 140  K 4.2  --  2.9* 3.5 3.2*  CL 94*  --   --  89*  --   CO2 45*  --   --  43*  --   GLUCOSE 108*  --   --  106*  --   BUN 8  --   --  7  --   CREATININE 0.43*  --   --  0.41*  --   CALCIUM 8.7*  --   --  8.8*  --   MG  --  2.2  --  2.1  --   PHOS  --  3.1  --  2.8  --    GFR: CrCl cannot be calculated (Unknown ideal weight.). Recent Labs  Lab 07/08/19 1241 07/08/19 1508 07/09/19 0306  PROCALCITON  --  <0.10  --   WBC 7.7  --  5.9  LATICACIDVEN  --  1.8  --     Liver Function Tests: Recent Labs  Lab 07/08/19 1241  AST 25  ALT 14  ALKPHOS 50  BILITOT 1.1  PROT 6.7  ALBUMIN 3.4*   No results for input(s): LIPASE, AMYLASE in the last 168 hours. No results for input(s): AMMONIA in the last 168 hours.  ABG    Component Value  Date/Time   PHART 7.486 (H) 07/09/2019 0528   PCO2ART 70.1 (HH) 07/09/2019 0528   PO2ART 67 (L) 07/09/2019 0528   HCO3 53.1 (H) 07/09/2019 0528   TCO2 >50 (H) 07/09/2019 0528   ACIDBASEDEF 1.1 05/20/2018 1240   O2SAT 94.0 07/09/2019 0528     Coagulation Profile: No results for input(s): INR, PROTIME in the last 168 hours.  Cardiac Enzymes: No results for input(s): CKTOTAL, CKMB, CKMBINDEX, TROPONINI in the last 168 hours.  HbA1C: No results found  for: HGBA1C  CBG: No results for input(s): GLUCAP in the last 168 hours.  Past Medical History  She,  has a past medical history of CHF (congestive heart failure) ([place]), COPD (chronic obstructive pulmonary disease) ([place]), Current smoker, and Opiate abuse, continuous ([place]).   Surgical History    Past Surgical History:  Procedure Laterality Date  . ABDOMINAL HYSTERECTOMY    . BACK SURGERY    . cesction    . COLOSTOMY  01/13/2018   Procedure: COLOSTOMY Creation;  Surgeon: Jules Husbands, MD;  Location: [facility] ORS;  Service: General;;  . INCISIONAL HERNIA REPAIR     lower midline laparotomy incision (hysterectomy), repaired with mesh  . IR FLUORO GUIDE CV LINE RIGHT  05/26/2018  . IR REMOVAL TUN CV CATH W/O FL  06/02/2018  . IR US GUIDE VASC ACCESS RIGHT  05/26/2018  . LAPAROSCOPIC CHOLECYSTECTOMY  2012  . LAPAROSCOPIC LYSIS OF ADHESIONS  01/13/2018   Procedure: LAPAROSCOPIC LYSIS OF ADHESIONS;  Surgeon: Jules Husbands, MD;  Location: [facility] ORS;  Service: General;;  . LAPAROSCOPIC SIGMOID COLECTOMY  01/13/2018   Procedure: LAPAROSCOPIC SIGMOID COLECTOMY;  Surgeon: Jules Husbands, MD;  Location: [facility] ORS;  Service: General;;  . ORIF WRIST FRACTURE Right 08/25/2015   Procedure: OPEN REDUCTION INTERNAL FIXATION (ORIF) WRIST FRACTURE;  Surgeon: Corky Mull, MD;  Location: [facility] ORS;  Service: Orthopedics;  Laterality: Right;     Social History   reports that she has been smoking cigarettes. She has smoked for the past 15.00 years. She has  never used smokeless tobacco. She reports current alcohol use. She reports current drug use. Drug: Marijuana.   Family History   Her family history includes COPD in her mother; Diabetes in her father; Hypertension in her father.   Allergies Allergies  Allergen Reactions  . Other Nausea And Vomiting    Anti-depressent that starts with t     Home Medications  Prior to Admission medications   Medication Sig Start Date End Date Taking? Authorizing Provider  acetaminophen (TYLENOL) 325 MG tablet Take 1-2 tablets (325-650 mg total) by mouth every 4 (four) hours as needed for mild pain. 06/05/18   Love, Ivan Anchors, PA-C  albuterol (PROVENTIL HFA;VENTOLIN HFA) 108 (90 Base) MCG/ACT inhaler Inhale into the lungs every 6 (six) hours as needed for wheezing or shortness of breath.    [provider]  albuterol (PROVENTIL) (2.5 MG/3ML) 0.083% nebulizer solution Take 2.5 mg by nebulization. Inhale one vial via nebulizer 4 times a day as directed.    [provider]  ALPRAZolam Duanne Moron) 0.25 MG tablet Take 1 tablet (0.25 mg total) by mouth 3 (three) times daily. 07/04/19   Ronnell Freshwater, NP  amLODipine (NORVASC) 10 MG tablet Take 1 tablet (10 mg total) by mouth daily. 07/04/19   Boscia, Greer Ee, NP  [facility] MELATONIN 3 MG TABS TAKE 1 TABLET (3 MG TOTAL) BY MOUTH AT BEDTIME. USE IN PLACE OF Lorrin Mais 07/02/18   Sinda Du, MD  escitalopram (LEXAPRO) 20 MG tablet Take 1 tablet (20 mg total) by mouth daily. 04/25/19   Kendell Bane, NP  Fluticasone-Umeclidin-Vilant (TRELEGY ELLIPTA) 100-62.5-25 MCG/INH AEPB Inhale 1 each into the lungs daily. 06/21/19   Kendell Bane, NP  furosemide (LASIX) 20 MG tablet Take 1 tablet (20 mg total) by mouth daily. 05/04/19   Ronnell Freshwater, NP  gabapentin (NEURONTIN) 800 MG tablet Take 1 tablet (800 mg total) by mouth 3 (three) times daily. 07/04/19   Boscia,  Greer Ee, NP  methocarbamol (ROBAXIN) 500 MG tablet Take 2 tablets (1,000 mg total) by mouth 3  (three) times daily. 06/05/18   Love, Ivan Anchors, PA-C  montelukast (SINGULAIR) 10 MG tablet Take 1 tablet (10 mg total) by mouth at bedtime. 05/04/19   Ronnell Freshwater, NP  Multiple Vitamins-Minerals (MULTIVITAMIN WITH MINERALS) tablet Take 1 tablet by mouth daily.    [provider]  pantoprazole (PROTONIX) 40 MG tablet Take 1 tablet (40 mg total) by mouth 2 (two) times daily. 04/02/19   Scarboro, Audie Clear, NP  polyethylene glycol (MIRALAX / GLYCOLAX) 17 g packet Take 17 g by mouth 2 (two) times daily. 06/05/18   Love, Ivan Anchors, PA-C  QUEtiapine (SEROQUEL) 50 MG tablet Take 1 tablet (50 mg total) by mouth at bedtime. 07/04/19   Ronnell Freshwater, NP  roflumilast (DALIRESP) 500 MCG TABS tablet Take 1 tablet (500 mcg total) by mouth daily. 07/04/19   Ronnell Freshwater, NP  amLODipine (NORVASC) 10 MG tablet Take 1 tablet (10 mg total) by mouth daily. 06/05/18   Love, Ivan Anchors, PA-C  Melatonin 3 MG TABS Take 1 tablet (3 mg total) by mouth at bedtime. Use in place of ambien 06/05/18   Love, Ivan Anchors, PA-C  pantoprazole (PROTONIX) 40 MG tablet Take 1 tablet (40 mg total) by mouth 2 (two) times daily. 06/05/18   Bary Leriche, PA-C     Hayden Pedro, AGACNP-BC [facility]  PCCM Pgr: [phone]   PCCM:  49 yo hypercapnic resp failure, BIPAP since yesterday. Improved. Now no longer confused with CO2 narcosis. Also, concern for polypharmacy on admission. Takes multi-sedating meds. Discussed with husband at bedside. UDS positive for THC.   BP 127/67 (BP Location: Right Arm)   Pulse 79   Temp 98.3 F (36.8 C) (Oral)   Resp 20   SpO2 100%   Gen: talkative, middle aged FM, appears older than stated age, obese  Neck: large  Heart: RRR, s1 s2, distant heart tones  Lungs: diminished, no wheeze  Labs; reviewed  CXR: reviewed   A:  Obese  OHS Likely COPD, 1 ppd smoker  OSA On BIPAP at home at night  Chronic hypoxemic resp failure 4L Polypharmacy, multiple sedating  meds  P: Needs OP pulm follow up and sleep follow up  She lives near [place] sleep in [place] Needs to avoid all of these meds that alter her sensorium  Patient was counseled on this scheduled nebs  Taper steroids   Garner Nash, DO [facility] 07/09/2019 4:36 PM

## 2019-07-09 NOTE — Progress Notes (Signed)
eLink Physician-Brief Progress Note Patient Name: Misty Green DOB: [birth year] MRN: [id number]   Date of Service  07/09/2019  HPI/Events of Note  Patient is here with COPD exacerbation. She is on BiPAP with significant improvement in blood gas. She takes Xanax 0.25 mg and seroquel 50 mg QHS and is requesting these for significant anxiety.   eICU Interventions  Ordered these home medications. Emphasized to RN and stated in order that patient must be willing to wear BiPAP throughout the night in order to receive these medications.     Intervention Category Minor Interventions: Agitation / anxiety - evaluation and management;Routine modifications to care plan (e.g. PRN medications for pain, fever)  Marily Lente Misty Green 07/09/2019, 12:15 AM

## 2019-07-09 NOTE — ED Notes (Signed)
Lunch Tray Ordered @ 1050. 

## 2019-07-09 NOTE — ED Notes (Signed)
Pt requesting her home Xanax and Seroquel. CCM paged. This RN spoke with someone from [facility], who stated the Doctor on tonight was dealing with a situation that they would take a look at the pt's chart shortly and get back to me. This RN informed pt that CCM was paged and we were waiting on approval from the doctor. Will continue to monitor pt.

## 2019-07-09 NOTE — ED Notes (Signed)
ICU

## 2019-07-09 NOTE — ED Notes (Signed)
Bipap off per order, placed on 4 liters [place]

## 2019-07-09 NOTE — ED Notes (Signed)
Called for hospital bed again

## 2019-07-10 LAB — BLOOD GAS, ARTERIAL
Acid-Base Excess: 21.7 mmol/L — ABNORMAL HIGH (ref 0.0–2.0)
Acid-Base Excess: 21.1 mmol/L — ABNORMAL HIGH (ref 0.0–2.0)
Bicarbonate: 48.6 mmol/L — ABNORMAL HIGH (ref 20.0–28.0)
Bicarbonate: 47.6 mmol/L — ABNORMAL HIGH (ref 20.0–28.0)
Drawn by: 59076
FIO2: 40
FIO2: 40
O2 Saturation: 97.5 %
O2 Saturation: 97.3 %
Patient temperature: 37
Patient temperature: 37
pCO2 arterial: 92.4 mmHg (ref 32.0–48.0)
pCO2 arterial: 81.9 mmHg (ref 32.0–48.0)
pH, Arterial: 7.341 — ABNORMAL LOW (ref 7.350–7.450)
pH, Arterial: 7.382 (ref 7.350–7.450)
pO2, Arterial: 96.1 mmHg (ref 83.0–108.0)
pO2, Arterial: 92.3 mmHg (ref 83.0–108.0)

## 2019-07-10 LAB — CBC
HCT: 42.1 % (ref 36.0–46.0)
Hemoglobin: 12.3 g/dL (ref 12.0–15.0)
MCH: 30.5 pg (ref 26.0–34.0)
MCHC: 29.2 g/dL — ABNORMAL LOW (ref 30.0–36.0)
MCV: 104.5 fL — ABNORMAL HIGH (ref 80.0–100.0)
Platelets: 168 10*3/uL (ref 150–400)
RBC: 4.03 MIL/uL (ref 3.87–5.11)
RDW: 12 % (ref 11.5–15.5)
WBC: 7.5 10*3/uL (ref 4.0–10.5)
nRBC: 0 % (ref 0.0–0.2)

## 2019-07-10 LAB — BASIC METABOLIC PANEL
Anion gap: 8 (ref 5–15)
BUN: 18 mg/dL (ref 6–20)
CO2: 45 mmol/L — ABNORMAL HIGH (ref 22–32)
Calcium: 9.1 mg/dL (ref 8.9–10.3)
Chloride: 89 mmol/L — ABNORMAL LOW (ref 98–111)
Creatinine, Ser: 0.56 mg/dL (ref 0.44–1.00)
GFR calc Af Amer: >60 mL/min (ref >60)
GFR calc non Af Amer: >60 mL/min (ref >60)
Glucose, Bld: 87 mg/dL (ref 70–99)
Potassium: 3.9 mmol/L (ref 3.5–5.1)
Sodium: 142 mmol/L (ref 135–145)

## 2019-07-10 LAB — PHOSPHORUS: Phosphorus: 3.8 mg/dL (ref 2.5–4.6)

## 2019-07-10 LAB — MAGNESIUM: Magnesium: 1.9 mg/dL (ref 1.7–2.4)

## 2019-07-10 MED ORDER — IPRATROPIUM-ALBUTEROL 0.5-2.5 (3) MG/3ML IN SOLN
3.0000 mL | Freq: Four times a day (QID) | RESPIRATORY_TRACT | Status: DC
  Administered 2019-07-10 – 2019-07-11 (×7): 3 mL via RESPIRATORY_TRACT
  Filled 2019-07-10 (×8): qty 3

## 2019-07-10 MED ORDER — POTASSIUM CHLORIDE CRYS ER 20 MEQ PO TBCR
40.0000 meq | EXTENDED_RELEASE_TABLET | Freq: Once | ORAL | Status: AC
  Administered 2019-07-10: 40 meq via ORAL
  Filled 2019-07-10: qty 2

## 2019-07-10 NOTE — Evaluation (Signed)
Physical Therapy Evaluation Patient Details Name: Misty Green MRN: [id number] DOB: 1970-02-12 Today's Date: 07/10/2019   History of Present Illness  Pt is 49 yo female with PMH of SOA/COBD, CHF, colostomy, and multiple back surgeries.  She presented to ED with lethergy and decreased O2 sats.  Pt admitted with acute on chronic hypercapnic respiratory failure in the setting of OSA, COPD, and polypharmacy on multiple sedating medications.  Clinical Impression  Pt admitted with above diagnosis. Pt was able to transfer and ambulate with supervision.  She is on 4 LPM O2 (which is her baseline) and sats down to 88% with walking but 85% with ADLs.  She required cues for rest breaks and pursed lip breathing.  Pt does have support at home and necessary DME.   Pt currently with functional limitations due to the deficits listed below (see PT Problem List). Pt will benefit from skilled PT to increase their independence and safety with mobility to allow discharge to the venue listed below.  Pt request HHPT at d/c to continue to work on decreased mobility, strength, endurance and balance.      Follow Up Recommendations Home health PT    Equipment Recommendations  None recommended by PT    Recommendations for Other Services       Precautions / Restrictions Precautions Precautions: Other (comment) Precaution Comments: watch O2      Mobility  Bed Mobility Overal bed mobility: Needs Assistance Bed Mobility: Supine to Sit;Sit to Supine     Supine to sit: Supervision Sit to supine: Supervision      Transfers Overall transfer level: Needs assistance Equipment used: None Transfers: Sit to/from Stand Sit to Stand: Supervision         General transfer comment: sit to stand x 3; supervision for safety; performed toileting ADLs independently  Ambulation/Gait Ambulation/Gait assistance: Supervision Gait Distance (Feet): 200 Feet Assistive device: None Gait Pattern/deviations:  Step-through pattern;Wide base of support Gait velocity: decreased   General Gait Details: cues to focus on breathing and for pursed lip breathing; fatigued easily  Stairs            Wheelchair Mobility    Modified Rankin (Stroke Patients Only)       Balance Overall balance assessment: Needs assistance Sitting-balance support: No upper extremity supported;Feet supported Sitting balance-Leahy Scale: Normal     Standing balance support: No upper extremity supported;During functional activity Standing balance-Leahy Scale: Good                               Pertinent Vitals/Pain Pain Assessment: 0-10 Pain Score: 5  Pain Location: back - chronic Pain Descriptors / Indicators: Aching Pain Intervention(s): Limited activity within patient's tolerance;Monitored during session;Relaxation;Repositioned    Home Living Family/patient expects to be discharged to:: Private residence Living Arrangements: Spouse/significant other Available Help at Discharge: Family;Available PRN/intermittently Type of Home:  (double wide) Home Access: Stairs to enter Entrance Stairs-Rails: Right;Left;Can reach both Entrance Stairs-Number of Steps: 4 Home Layout: One level Home Equipment: Shower seat;Cane - single point;Walker - 4 wheels;Walker - 2 wheels      Prior Function Level of Independence: Independent   Gait / Transfers Assistance Needed: Able to ambulate short community distances - limited due to resp status.     Comments: Independent with all ADL/IADLs, helps to take care of grandkids (daughter and son in law, 70 month old, 53 year old live with her).  Reports she is on 4 LPM  O2 at all times with sats normally low 90's rest and can drop into low 80's with activity at times.     Hand Dominance        Extremity/Trunk Assessment   Upper Extremity Assessment Upper Extremity Assessment: Overall WFL for tasks assessed (Demonstrated functional strength but did not MMT due to  [facility])    Lower Extremity Assessment Lower Extremity Assessment: Overall WFL for tasks assessed (Demonstrated functional strength but did not MMT due to [facility])    Cervical / Trunk Assessment Cervical / Trunk Assessment: Normal  Communication   Communication: No difficulties  Cognition Arousal/Alertness: Awake/alert Behavior During Therapy: WFL for tasks assessed/performed Overall Cognitive Status: Within Functional Limits for tasks assessed                                        General Comments General comments (skin integrity, edema, etc.): Pt was on 4 LPM O2 with sats 95% rest, down to 85% with toielting and ADLs taking 1 minute of pursed lip breathing to recover prior to walking, with walking sats only down to 88% and took <30 sec to recover with rest.    Exercises     Assessment/Plan    PT Assessment Patient needs continued PT services  PT Problem List Decreased strength;Decreased mobility;Decreased activity tolerance;Cardiopulmonary status limiting activity;Decreased balance       PT Treatment Interventions DME instruction;Therapeutic activities;Gait training;Therapeutic exercise;Patient/family education;Balance training;Stair training;Functional mobility training    PT Goals (Current goals can be found in the Care Plan section)  Acute Rehab PT Goals Patient Stated Goal: return home; get stronger PT Goal Formulation: With patient/family Time For Goal Achievement: 07/24/19 Potential to Achieve Goals: Good    Frequency Min 3X/week   Barriers to discharge        Co-evaluation               AM-PAC PT "6 Clicks" Mobility  Outcome Measure Help needed turning from your back to your side while in a flat bed without using bedrails?: None Help needed moving from lying on your back to sitting on the side of a flat bed without using bedrails?: None Help needed moving to and from a bed to a chair (including a wheelchair)?: None Help needed standing up  from a chair using your arms (e.g., wheelchair or bedside chair)?: None Help needed to walk in hospital room?: None Help needed climbing 3-5 steps with a railing? : A Little 6 Click Score: 23    End of Session Equipment Utilized During Treatment: Gait belt Activity Tolerance: Patient tolerated treatment well Patient left: in bed;with call bell/phone within reach;with family/visitor present Nurse Communication: Mobility status PT Visit Diagnosis: Other abnormalities of gait and mobility (R26.89)    Time: 1700-1725 PT Time Calculation (min) (ACUTE ONLY): 25 min   Charges:   PT Evaluation $PT Eval Moderate Complexity: 1 Mod          Orton Capell, PT Acute Rehab Services Pager [phone] [facility] [phone]    Karlton Lemon 07/10/2019, 5:36 PM

## 2019-07-10 NOTE — Progress Notes (Signed)
MD paged critical PCO2 results from ABG 81.9.  Verbal orders received to leave patient on bipap until CCM rounds this am.  Will continue to monitor.

## 2019-07-10 NOTE — Progress Notes (Signed)
Patient ABG results as follows PH 7.341 Pco2 92.4 Po2 96.1 acid base 21.7 and bicarb 48.6 . Patient remains on bipap.Results called to Dr.Sommer.No new orders received plan to leave patient on bipap for now.

## 2019-07-10 NOTE — Progress Notes (Addendum)
PROGRESS NOTE    Misty Green  ION:[id number] DOB: 22-Jun-1970 DOA: 07/08/2019 PCP: Ronnell Freshwater, NP   Brief Narrative: 49 year old with known history of SOA/COPD who presented with increasing  lethargy on 6/20 admitted by CCM.  When patient was evaluated by EMS her oxygen saturation was 80% on 4 L of oxygen.  She received 2 g of magnesium and 125 mg of Solu-Medrol.  Evaluation in the ED patient was tachypneic with decreased breath sounds.  Chest x-ray showed cardiomegaly and atelectasis in the left lateral lung.  ABG: pH 7.1, PCO2 120 oxygen 60.  Patient was placed on BiPAP.  Patient continued to smoke 4 to 5 cigarettes/day.  She is under a lot of stress at home.  She follows with a pulmonologist in [place].  Assessment & Plan:   Active Problems:   Respiratory failure with hypercapnia (HCC)  1-Acute on chronic hypoxic hypercapnic respiratory failure in the setting of OSA AECOPD, polypharmacy on multiple sedating medications, -Patient has been mistaking medications.  She was taking melatonin tablets and Gummies without knowing they were both the same kind of medications.  She has been also taking twice as many tablets of Xanax at night.  Dr. Katy Fitch discussed medications with patient -Blood gas this morning showed PCO2 increased to 90.  She remained on BiPAP.  She report air leaking all day yesterday from mask.  Repeated ABG after mask was fixated showed improved PCO2 down to 80s. -Continue with the scheduled nebulizer, need 5 days of steroids -  2-GERD: Continue PPI twice daily 3-Anxiety, insomnia: Continue with Lexapro and Seroquel. Restarted home Xanax. (Patient states that she takes 2 tablet at bedtime, however it disorder as 1 tablet 3 times daily, Dr. Valeta Harms discussed with patient medications. )  Would  discharge with Xanax only at bedtime as needed  4-Neuropathy: Resume Neurontin half dose Chronic Diastolic heart failure. compensated Diverticulitis status post Hartman's with  colostomy  Estimated body mass index is 35.15 kg/m as calculated from the following:   Height as of 05/04/19: 5\' 4"  (1.626 m).   Weight as of 05/04/19: 92.9 kg.   DVT prophylaxis: Heparin Code Status: Full code Family Communication: Care discussed with patient Disposition Plan:  Status is: Inpatient  Remains inpatient appropriate because:Hemodynamically unstable   Dispo: The patient is from: Home              Anticipated d/c is to: Home              Anticipated d/c date is: 1 day              Patient currently is not medically stable to d/c.  Observe overnight and repeat blood gas in the morning, to make sure that PCO2 will not increase.         Consultants:  CCM Procedures:   None  Antimicrobials:    Subjective: Patient is alert and conversant, she was using BiPAP during my evaluation.  She reported that yesterday her mask was leaking air a lot. This morning her mask was fixed   Objective: Vitals:   07/10/19 0742 07/10/19 0757 07/10/19 0824 07/10/19 1142  BP: 124/79  124/79   Pulse: 63 75 63   Resp: 19 (!) 27 (!) 24   Temp: 98.6 F (37 C)  98.6 F (37 C)   TempSrc:   Oral   SpO2: 98% 99% 98% 96%   No intake or output data in the 24 hours ending 07/10/19 1334 There were no vitals filed  for this visit.  Examination:  General exam: Appears calm and comfortable  Respiratory system: Clear to auscultation. Respiratory effort normal. Cardiovascular system: S1 & S2 heard, RRR. No JVD, murmurs, rubs, gallops or clicks. No pedal edema. Gastrointestinal system: Abdomen is nondistended, soft and nontender. No organomegaly or masses felt. Normal bowel sounds heard. Central nervous system: Alert and oriented. No focal neurological deficits. Extremities: Symmetric 5 x 5 power. Skin: No rashes, lesions or ulcers    Data Reviewed: I have personally reviewed following labs and imaging studies  CBC: Recent Labs  Lab 07/08/19 1241 07/08/19 2210 07/09/19 0306  07/09/19 0528 07/10/19 0757  WBC 7.7  --  5.9  --  7.5  NEUTROABS 4.4  --   --   --   --   HGB 13.7 15.0 13.0 14.6 12.3  HCT 49.3* 44.0 46.0 43.0 42.1  MCV 110.5*  --  106.7*  --  104.5*  PLT 170  --  171  --  458   Basic Metabolic Panel: Recent Labs  Lab 07/08/19 1241 07/08/19 1508 07/08/19 2210 07/09/19 0306 07/09/19 0528 07/10/19 0757  NA 147*  --  140 143 140 142  K 4.2  --  2.9* 3.5 3.2* 3.9  CL 94*  --   --  89*  --  89*  CO2 45*  --   --  43*  --  45*  GLUCOSE 108*  --   --  106*  --  87  BUN 8  --   --  7  --  18  CREATININE 0.43*  --   --  0.41*  --  0.56  CALCIUM 8.7*  --   --  8.8*  --  9.1  MG  --  2.2  --  2.1  --  1.9  PHOS  --  3.1  --  2.8  --  3.8   GFR: CrCl cannot be calculated (Unknown ideal weight.). Liver Function Tests: Recent Labs  Lab 07/08/19 1241  AST 25  ALT 14  ALKPHOS 50  BILITOT 1.1  PROT 6.7  ALBUMIN 3.4*   No results for input(s): LIPASE, AMYLASE in the last 168 hours. No results for input(s): AMMONIA in the last 168 hours. Coagulation Profile: No results for input(s): INR, PROTIME in the last 168 hours. Cardiac Enzymes: No results for input(s): CKTOTAL, CKMB, CKMBINDEX, TROPONINI in the last 168 hours. BNP (last 3 results) No results for input(s): PROBNP in the last 8760 hours. HbA1C: No results for input(s): HGBA1C in the last 72 hours. CBG: No results for input(s): GLUCAP in the last 168 hours. Lipid Profile: No results for input(s): CHOL, HDL, LDLCALC, TRIG, CHOLHDL, LDLDIRECT in the last 72 hours. Thyroid Function Tests: No results for input(s): TSH, T4TOTAL, FREET4, T3FREE, THYROIDAB in the last 72 hours. Anemia Panel: No results for input(s): VITAMINB12, FOLATE, FERRITIN, TIBC, IRON, RETICCTPCT in the last 72 hours. Sepsis Labs: Recent Labs  Lab 07/08/19 1508  PROCALCITON <0.10  LATICACIDVEN 1.8    Recent Results (from the past 240 hour(s))  SARS Coronavirus 2 by RT PCR (hospital order, performed in [facility] hospital lab) Nasopharyngeal Nasopharyngeal Swab     Status: None   Collection Time: 07/08/19 12:39 PM   Specimen: Nasopharyngeal Swab  Result Value Ref Range Status   SARS Coronavirus 2 NEGATIVE NEGATIVE Final    Comment: (NOTE) SARS-CoV-2 target nucleic acids are NOT DETECTED.  The SARS-CoV-2 RNA is generally detectable in upper and lower respiratory specimens during the  acute phase of infection. The lowest concentration of SARS-CoV-2 viral copies this assay can detect is 250 copies / mL. A negative result does not preclude SARS-CoV-2 infection and should not be used as the sole basis for treatment or other patient management decisions.  A negative result may occur with improper specimen collection / handling, submission of specimen other than nasopharyngeal swab, presence of viral mutation(s) within the areas targeted by this assay, and inadequate number of viral copies (<250 copies / mL). A negative result must be combined with clinical observations, patient history, and epidemiological information.  Fact Sheet for Patients:   StrictlyIdeas.no  Fact Sheet for Healthcare Providers: BankingDealers.co.za  This test is not yet approved or  cleared by the [place] FDA and has been authorized for detection and/or diagnosis of SARS-CoV-2 by FDA under an Emergency Use Authorization (EUA).  This EUA will remain in effect (meaning this test can be used) for the duration of the COVID-19 declaration under Section 564(b)(1) of the Act, 21 U.S.C. section 360bbb-3(b)(1), unless the authorization is terminated or revoked sooner.  Performed at [facility], [place] [address], [place], [place] [address]          Radiology Studies: No results found.      Scheduled Meds: . amLODipine  10 mg Oral Daily  . escitalopram  20 mg Oral Daily  . fluticasone furoate-vilanterol  1 puff Inhalation Daily   And  . umeclidinium  bromide  1 puff Inhalation Daily  . furosemide  20 mg Oral Daily  . gabapentin  400 mg Oral TID  . heparin  5,000 Units Subcutaneous Q8H  . ipratropium-albuterol  3 mL Nebulization QID  . melatonin  10 mg Oral QHS  . nicotine  21 mg Transdermal Daily  . pantoprazole  40 mg Oral BID  . predniSONE  40 mg Oral Q breakfast  . QUEtiapine  50 mg Oral QHS  . roflumilast  500 mcg Oral Daily   Continuous Infusions:   LOS: 2 days    Time spent: 35 minutes.     Elmarie Shiley, MD [facility]   If 7PM-7AM, please contact night-coverage [email or web address]  07/10/2019, 1:34 PM

## 2019-07-11 ENCOUNTER — Encounter (HOSPITAL_COMMUNITY): Payer: Self-pay | Admitting: Pulmonary Disease

## 2019-07-11 DIAGNOSIS — J9621 Acute and chronic respiratory failure with hypoxia: Principal | ICD-10-CM

## 2019-07-11 DIAGNOSIS — I5032 Chronic diastolic (congestive) heart failure: Secondary | ICD-10-CM

## 2019-07-11 DIAGNOSIS — F17200 Nicotine dependence, unspecified, uncomplicated: Secondary | ICD-10-CM

## 2019-07-11 DIAGNOSIS — F419 Anxiety disorder, unspecified: Secondary | ICD-10-CM

## 2019-07-11 DIAGNOSIS — I10 Essential (primary) hypertension: Secondary | ICD-10-CM

## 2019-07-11 DIAGNOSIS — G4733 Obstructive sleep apnea (adult) (pediatric): Secondary | ICD-10-CM

## 2019-07-11 DIAGNOSIS — Z9189 Other specified personal risk factors, not elsewhere classified: Secondary | ICD-10-CM

## 2019-07-11 DIAGNOSIS — F329 Major depressive disorder, single episode, unspecified: Secondary | ICD-10-CM

## 2019-07-11 DIAGNOSIS — Z933 Colostomy status: Secondary | ICD-10-CM

## 2019-07-11 LAB — BLOOD GAS, ARTERIAL
Acid-Base Excess: 15.9 mmol/L — ABNORMAL HIGH (ref 0.0–2.0)
Bicarbonate: 42 mmol/L — ABNORMAL HIGH (ref 20.0–28.0)
FIO2: 40
O2 Saturation: 98.4 %
Patient temperature: 37
pCO2 arterial: 74.6 mmHg (ref 32.0–48.0)
pH, Arterial: 7.369 (ref 7.350–7.450)
pO2, Arterial: 132 mmHg — ABNORMAL HIGH (ref 83.0–108.0)

## 2019-07-11 LAB — CBC
HCT: 43.2 % (ref 36.0–46.0)
Hemoglobin: 12.8 g/dL (ref 12.0–15.0)
MCH: 30.5 pg (ref 26.0–34.0)
MCHC: 29.6 g/dL — ABNORMAL LOW (ref 30.0–36.0)
MCV: 103.1 fL — ABNORMAL HIGH (ref 80.0–100.0)
Platelets: 169 10*3/uL (ref 150–400)
RBC: 4.19 MIL/uL (ref 3.87–5.11)
RDW: 12.1 % (ref 11.5–15.5)
WBC: 6.5 10*3/uL (ref 4.0–10.5)
nRBC: 0 % (ref 0.0–0.2)

## 2019-07-11 LAB — BASIC METABOLIC PANEL
Anion gap: 8 (ref 5–15)
BUN: 19 mg/dL (ref 6–20)
CO2: 40 mmol/L — ABNORMAL HIGH (ref 22–32)
Calcium: 9.1 mg/dL (ref 8.9–10.3)
Chloride: 95 mmol/L — ABNORMAL LOW (ref 98–111)
Creatinine, Ser: 0.5 mg/dL (ref 0.44–1.00)
GFR calc Af Amer: >60 mL/min (ref >60)
GFR calc non Af Amer: >60 mL/min (ref >60)
Glucose, Bld: 98 mg/dL (ref 70–99)
Potassium: 4.4 mmol/L (ref 3.5–5.1)
Sodium: 143 mmol/L (ref 135–145)

## 2019-07-11 MED ORDER — SENNOSIDES-DOCUSATE SODIUM 8.6-50 MG PO TABS
1.0000 | ORAL_TABLET | Freq: Two times a day (BID) | ORAL | Status: DC | PRN
  Administered 2019-07-11: 1 via ORAL
  Filled 2019-07-11: qty 1

## 2019-07-11 MED ORDER — DOCUSATE SODIUM 100 MG PO CAPS
100.0000 mg | ORAL_CAPSULE | Freq: Every day | ORAL | Status: DC
  Administered 2019-07-11 – 2019-07-12 (×2): 100 mg via ORAL
  Filled 2019-07-11 (×2): qty 1

## 2019-07-11 MED ORDER — POLYETHYLENE GLYCOL 3350 17 G PO PACK
17.0000 g | PACK | Freq: Every day | ORAL | Status: DC
  Administered 2019-07-11 – 2019-07-12 (×2): 17 g via ORAL
  Filled 2019-07-11 (×2): qty 1

## 2019-07-11 NOTE — Progress Notes (Signed)
CRITICAL VALUE ALERT  Critical Value:  PCO2: 74.6   Date & Time Notied:  07/11/19 @0517    Provider Notified: M. Sharlet Salina  Orders Received/Actions taken: None at this time, pt currently still on BiPAP

## 2019-07-11 NOTE — Progress Notes (Signed)
SATURATION QUALIFICATIONS: (This note is used to comply with regulatory documentation for home oxygen)  Patient Saturations on 4 Liters of oxygen while Ambulating = 84%  Patient Saturations on 6 Liters of oxygen while Ambulating = 87 %  Please briefly explain why patient needs home oxygen: Patient is unable to maintain oxygen saturations with activity.

## 2019-07-11 NOTE — Progress Notes (Signed)
PROGRESS NOTE  Misty Green HMC:[id number] DOB: April 22, 1970   PCP: Ronnell Freshwater, NP  Patient is from: Home  DOA: 07/08/2019 LOS: 3  Brief Narrative / Interim history: 49 year old with history of OSA/COPD/chronic RF on 4 L followed by pulmonology in [place], colostomy, ongoing cigarette smoker, anxiety, neuropathy and GERD who presented with increasing  lethargy, hypoxemia to 80% on 4 L and admitted to ICU on 6/20. ABG 7.1/>120/60/52. Patient was placed on BiPAP with improvement in her respiratory status.  Eventually, she improved and transferred to [facility] service.  Subjective: Seen and examined earlier this morning.  No major events overnight or this morning.  Concerned about any ostomy output, and asking for MiraLAX.  Denies chest pain or dyspnea.  Denies nausea or vomiting.  Ambulated on 4 L desaturated to 84%.  She recovered to 87% on 6 L.  Objective: Vitals:   07/11/19 0715 07/11/19 0750 07/11/19 0756 07/11/19 0802  BP:  122/70    Pulse:  64    Resp: 15 18    Temp:  98.3 F (36.8 C)    TempSrc:      SpO2:  96% 96% 96%    Intake/Output Summary (Last 24 hours) at 07/11/2019 1418 Last data filed at 07/11/2019 0730 Gross per 24 hour  Intake 595 ml  Output --  Net 595 ml   There were no vitals filed for this visit.  Examination:  GENERAL: No apparent distress.  Nontoxic. HEENT: MMM.  Vision and hearing grossly intact.  NECK: Supple.  No apparent JVD.  RESP: 96% on 4 L at rest.  No IWOB.  Diminished aeration bilaterally. CVS:  RRR. Heart sounds normal.  ABD/GI/GU: BS+. Abd soft, NTND.  MSK/EXT:  Moves extremities. No apparent deformity. No edema.  SKIN: no apparent skin lesion or wound NEURO: Awake, alert and oriented appropriately.  No apparent focal neuro deficit. PSYCH: Calm. Normal affect.  Procedures:  None  Microbiology summarized: COVID-19 PCR negative.  Assessment & Plan: Acute on chronic respiratory failure with hypoxia and  hypercapnia-multifactorial-COPD exacerbation, OSA and polypharmacy. -ABG 7.1/>120/60/52 (on admit)>> 7.37/75/133/42 (today). 96% on 4 L at rest, 84% with ambulation on 4L only to recover to 87% on 6 L. -Continue intermittent and nightly BiPAP -Minimum oxygen to keep saturation above 88% despite 4 L at home.  Emphasized the importance of this and discussed with RN. -Incentive spirometry/OOB/PT/OT/daily ambulation -Minimize sedating medications  Acute on chronic COPD exacerbation: likely stage D COPD -Continue prednisone, LABA/LAMA/ICS, Daliresp And DuoNeb  OSA/possible OHS -Nightly BiPAP as above  Chronic diastolic CHF: Appears euvolemic. -Continue p.o. Lasix -Monitor fluid status  Anxiety/insomnia/neuropathic pain -Continue home Xanax, gabapentin and Seroquel at reduced dose -Continue home Lexapro  GERD -Continue home PPI  Essential hypertension: Normotensive -Continue home Norvasc  Colostomy due to diverticulitis -Continue colostomy care  Constipation -Scheduled MiraLAX and Colace  Morbid obesity: -Encourage lifestyle change to lose weight  Tobacco use disorder: Smokes about 4 to 5 cigarettes a day. -Encouraged cessation -Continue nicotine patch   There is no height or weight on file to calculate BMI.         DVT prophylaxis:  heparin injection 5,000 Units Start: 07/08/19 1700 SCDs Start: 07/08/19 1655  Code Status: Full code Family Communication: Updated patient's husband at bedside Status is: Inpatient  Remains inpatient appropriate because:Inpatient level of care appropriate due to severity of illness and Ongoing respiratory failure with significant hypoxia   Dispo: The patient is from: Home  Anticipated d/c is to: Home              Anticipated d/c date is: 1 day              Patient currently is not medically stable to d/c.       Consultants:  PCCM   Sch Meds:  Scheduled Meds: . amLODipine  10 mg Oral Daily  . docusate  sodium  100 mg Oral Daily  . escitalopram  20 mg Oral Daily  . fluticasone furoate-vilanterol  1 puff Inhalation Daily   And  . umeclidinium bromide  1 puff Inhalation Daily  . furosemide  20 mg Oral Daily  . gabapentin  400 mg Oral TID  . heparin  5,000 Units Subcutaneous Q8H  . ipratropium-albuterol  3 mL Nebulization QID  . melatonin  10 mg Oral QHS  . nicotine  21 mg Transdermal Daily  . pantoprazole  40 mg Oral BID  . polyethylene glycol  17 g Oral Daily  . predniSONE  40 mg Oral Q breakfast  . QUEtiapine  50 mg Oral QHS  . roflumilast  500 mcg Oral Daily   Continuous Infusions: PRN Meds:.ALPRAZolam, hydrALAZINE, ondansetron, senna-docusate  Antimicrobials: Anti-infectives (From admission, onward)   Start     Dose/Rate Route Frequency Ordered Stop   07/08/19 1445  azithromycin (ZITHROMAX) 500 mg in sodium chloride 0.9 % 250 mL IVPB        500 mg 250 mL/hr over 60 Minutes Intravenous  Once 07/08/19 1442 07/08/19 1716       I have personally reviewed the following labs and images: CBC: Recent Labs  Lab 07/08/19 1241 07/08/19 1241 07/08/19 2210 07/09/19 0306 07/09/19 0528 07/10/19 0757 07/11/19 0333  WBC 7.7  --   --  5.9  --  7.5 6.5  NEUTROABS 4.4  --   --   --   --   --   --   HGB 13.7   < > 15.0 13.0 14.6 12.3 12.8  HCT 49.3*   < > 44.0 46.0 43.0 42.1 43.2  MCV 110.5*  --   --  106.7*  --  104.5* 103.1*  PLT 170  --   --  171  --  168 169   < > = values in this interval not displayed.   BMP &GFR Recent Labs  Lab 07/08/19 1241 07/08/19 1241 07/08/19 1508 07/08/19 2210 07/09/19 0306 07/09/19 0528 07/10/19 0757 07/11/19 0333  NA 147*   < >  --  140 143 140 142 143  K 4.2   < >  --  2.9* 3.5 3.2* 3.9 4.4  CL 94*  --   --   --  89*  --  89* 95*  CO2 45*  --   --   --  43*  --  45* 40*  GLUCOSE 108*  --   --   --  106*  --  87 98  BUN 8  --   --   --  7  --  18 19  CREATININE 0.43*  --   --   --  0.41*  --  0.56 0.50  CALCIUM 8.7*  --   --   --  8.8*   --  9.1 9.1  MG  --   --  2.2  --  2.1  --  1.9  --   PHOS  --   --  3.1  --  2.8  --  3.8  --    < > =  values in this interval not displayed.   CrCl cannot be calculated (Unknown ideal weight.). Liver & Pancreas: Recent Labs  Lab 07/08/19 1241  AST 25  ALT 14  ALKPHOS 50  BILITOT 1.1  PROT 6.7  ALBUMIN 3.4*   No results for input(s): LIPASE, AMYLASE in the last 168 hours. No results for input(s): AMMONIA in the last 168 hours. Diabetic: No results for input(s): HGBA1C in the last 72 hours. No results for input(s): GLUCAP in the last 168 hours. Cardiac Enzymes: No results for input(s): CKTOTAL, CKMB, CKMBINDEX, TROPONINI in the last 168 hours. No results for input(s): PROBNP in the last 8760 hours. Coagulation Profile: No results for input(s): INR, PROTIME in the last 168 hours. Thyroid Function Tests: No results for input(s): TSH, T4TOTAL, FREET4, T3FREE, THYROIDAB in the last 72 hours. Lipid Profile: No results for input(s): CHOL, HDL, LDLCALC, TRIG, CHOLHDL, LDLDIRECT in the last 72 hours. Anemia Panel: No results for input(s): VITAMINB12, FOLATE, FERRITIN, TIBC, IRON, RETICCTPCT in the last 72 hours. Urine analysis:    Component Value Date/Time   COLORURINE STRAW (A) 07/09/2019 0337   APPEARANCEUR CLEAR 07/09/2019 0337   LABSPEC 1.005 07/09/2019 0337   PHURINE 5.0 07/09/2019 0337   GLUCOSEU NEGATIVE 07/09/2019 0337   HGBUR SMALL (A) 07/09/2019 0337   BILIRUBINUR NEGATIVE 07/09/2019 0337   KETONESUR NEGATIVE 07/09/2019 0337   PROTEINUR 100 (A) 07/09/2019 0337   NITRITE NEGATIVE 07/09/2019 0337   LEUKOCYTESUR NEGATIVE 07/09/2019 0337   Sepsis Labs: Invalid input(s): PROCALCITONIN, [place]  Microbiology: Recent Results (from the past 240 hour(s))  SARS Coronavirus 2 by RT PCR (hospital order, performed in [facility] hospital lab) Nasopharyngeal Nasopharyngeal Swab     Status: None   Collection Time: 07/08/19 12:39 PM   Specimen: Nasopharyngeal Swab    Result Value Ref Range Status   SARS Coronavirus 2 NEGATIVE NEGATIVE Final    Comment: (NOTE) SARS-CoV-2 target nucleic acids are NOT DETECTED.  The SARS-CoV-2 RNA is generally detectable in upper and lower respiratory specimens during the acute phase of infection. The lowest concentration of SARS-CoV-2 viral copies this assay can detect is 250 copies / mL. A negative result does not preclude SARS-CoV-2 infection and should not be used as the sole basis for treatment or other patient management decisions.  A negative result may occur with improper specimen collection / handling, submission of specimen other than nasopharyngeal swab, presence of viral mutation(s) within the areas targeted by this assay, and inadequate number of viral copies (<250 copies / mL). A negative result must be combined with clinical observations, patient history, and epidemiological information.  Fact Sheet for Patients:   StrictlyIdeas.no  Fact Sheet for Healthcare Providers: BankingDealers.co.za  This test is not yet approved or  cleared by the [place] FDA and has been authorized for detection and/or diagnosis of SARS-CoV-2 by FDA under an Emergency Use Authorization (EUA).  This EUA will remain in effect (meaning this test can be used) for the duration of the COVID-19 declaration under Section 564(b)(1) of the Act, 21 U.S.C. section 360bbb-3(b)(1), unless the authorization is terminated or revoked sooner.  Performed at [facility], [place] [address], [place], [place] [address]     Radiology Studies: No results found.    Javayah Magaw T. [place]  If 7PM-7AM, please contact night-coverage [email or web address] Password [facility] 07/11/2019, 2:18 PM

## 2019-07-11 NOTE — Progress Notes (Signed)
Occupational Therapy Evaluation Patient Details Name: Misty Green MRN: [id number] DOB: Jul 27, 1970 Today's Date: 07/11/2019    History of Present Illness Pt is 49 yo female with PMH of SOA/COBD, CHF, colostomy, and multiple back surgeries.  She presented to ED with lethergy and decreased O2 sats.  Pt admitted with acute on chronic hypercapnic respiratory failure in the setting of OSA, COPD, and polypharmacy on multiple sedating medications.   Clinical Impression   Patient lives with husband and other family members at home.  She is on 4L O2 at baseline and is independent with ADLs at prior level.  Today patient performing ADLs and mobility with supervision.  Had some shortness of breath with standing grooming activity while on 4L O2.  SpO2 was in high 80s for majority of session, low 90s at rest.  Patient would benefit from continued OT to work on increasing activity tolerance for increased independence level.      Follow Up Recommendations  Home health OT;Supervision - Intermittent    Equipment Recommendations  None recommended by OT    Recommendations for Other Services       Precautions / Restrictions Precautions Precautions: Other (comment) Precaution Comments: watch O2      Mobility Bed Mobility Overal bed mobility: Needs Assistance Bed Mobility: Supine to Sit;Sit to Supine     Supine to sit: Supervision Sit to supine: Supervision      Transfers Overall transfer level: Needs assistance Equipment used: None Transfers: Sit to/from Stand Sit to Stand: Supervision              Balance Overall balance assessment: Needs assistance Sitting-balance support: No upper extremity supported;Feet supported Sitting balance-Leahy Scale: Normal     Standing balance support: No upper extremity supported;During functional activity Standing balance-Leahy Scale: Good                             ADL either performed or assessed with clinical judgement    ADL Overall ADL's : Needs assistance/impaired Eating/Feeding: Independent;Sitting                                   Functional mobility during ADLs: Supervision/safety General ADL Comments: Patient supervision level assist for all ADLs at this time.  Leaned on counter when standing at sink for extra support     Vision Baseline Vision/History: Wears glasses       Perception     Praxis      Pertinent Vitals/Pain Pain Assessment: Faces Faces Pain Scale: Hurts little more Pain Location: back - chronic Pain Descriptors / Indicators: Aching Pain Intervention(s): Monitored during session;Limited activity within patient's tolerance;Repositioned     Hand Dominance Right   Extremity/Trunk Assessment Upper Extremity Assessment Upper Extremity Assessment: Overall WFL for tasks assessed   Lower Extremity Assessment Lower Extremity Assessment: Defer to PT evaluation   Cervical / Trunk Assessment Cervical / Trunk Assessment: Normal   Communication Communication Communication: No difficulties   Cognition Arousal/Alertness: Awake/alert Behavior During Therapy: WFL for tasks assessed/performed Overall Cognitive Status: Within Functional Limits for tasks assessed                                     General Comments  On 4L O2 [place] with SpO2 high 80s during brief standing grooming    Exercises  Shoulder Instructions      Home Living Family/patient expects to be discharged to:: Private residence Living Arrangements: Spouse/significant other Available Help at Discharge: Family;Available PRN/intermittently Type of Home: Other(Comment) Home Access: Stairs to enter Entrance Stairs-Number of Steps: 4 Entrance Stairs-Rails: Right;Left;Can reach both Home Layout: One level     Bathroom Shower/Tub: Teacher, early years/pre: Handicapped height     Home Equipment: Shower seat;Cane - single point;Walker - 4 wheels;Walker - 2 wheels           Prior Functioning/Environment Level of Independence: Independent  Gait / Transfers Assistance Needed: Able to ambulate short community distances - limited due to resp status.     Comments: Independent with all ADL/IADLs, helps to take care of grandkids (daughter and son in law, 40 month old, 16 year old live with her).  Reports she is on 4 LPM O2 at all times with sats normally low 90's rest and can drop into low 80's with activity at times.        OT Problem List: Decreased activity tolerance;Cardiopulmonary status limiting activity      OT Treatment/Interventions: Self-care/ADL training;Therapeutic exercise;Therapeutic activities;Energy conservation;Patient/family education    OT Goals(Current goals can be found in the care plan section) Acute Rehab OT Goals Patient Stated Goal: return home; get stronger OT Goal Formulation: With patient Time For Goal Achievement: 07/25/19 Potential to Achieve Goals: Good  OT Frequency: Min 2X/week   Barriers to D/C:            Co-evaluation              AM-PAC OT "6 Clicks" Daily Activity     Outcome Measure Help from another person eating meals?: None Help from another person taking care of personal grooming?: A Little Help from another person toileting, which includes using toliet, bedpan, or urinal?: A Little Help from another person bathing (including washing, rinsing, drying)?: A Little Help from another person to put on and taking off regular upper body clothing?: A Little Help from another person to put on and taking off regular lower body clothing?: A Little 6 Click Score: 19   End of Session Equipment Utilized During Treatment: Oxygen Nurse Communication: Mobility status  Activity Tolerance: Patient tolerated treatment well Patient left: in bed;with call bell/phone within reach  OT Visit Diagnosis: Other (comment) (decreased cardiopulminary )                Time: 5397-6734 OT Time Calculation (min): 12  min Charges:  OT General Charges $OT Visit: 1 Visit OT Evaluation $OT Eval Moderate Complexity: 1 [address] OTR/L   Phylliss Bob 07/11/2019, 12:21 PM

## 2019-07-12 DIAGNOSIS — M792 Neuralgia and neuritis, unspecified: Secondary | ICD-10-CM

## 2019-07-12 DIAGNOSIS — Z7189 Other specified counseling: Secondary | ICD-10-CM

## 2019-07-12 MED ORDER — NICOTINE 14 MG/24HR TD PT24
14.0000 mg | MEDICATED_PATCH | TRANSDERMAL | 0 refills | Status: AC
Start: 2019-07-12 — End: 2019-08-11

## 2019-07-12 MED ORDER — GABAPENTIN 400 MG PO CAPS: 400.0000 mg | ORAL_CAPSULE | Freq: Three times a day (TID) | ORAL | 1 refills | Status: AC

## 2019-07-12 MED ORDER — ALPRAZOLAM 0.25 MG PO TABS: 0.2500 mg | ORAL_TABLET | Freq: Every evening | ORAL | 0 refills | Status: DC | PRN

## 2019-07-12 MED ORDER — IPRATROPIUM-ALBUTEROL 0.5-2.5 (3) MG/3ML IN SOLN: 3.0000 mL | RESPIRATORY_TRACT | Status: DC | PRN

## 2019-07-12 MED ORDER — PREDNISONE 20 MG PO TABS: 40.0000 mg | ORAL_TABLET | Freq: Every day | ORAL | 0 refills | Status: DC

## 2019-07-12 MED FILL — predniSONE 20 MG TABS: 20 | 3 days supply | Qty: 6 | Fill #0

## 2019-07-12 MED FILL — NICOTINE 14 MG/24HR PATCH: 14 | 28 days supply | Qty: 28 | Fill #0

## 2019-07-12 MED FILL — GABAPENTIN 400 MG CAPSULE: 400 | 30 days supply | Qty: 90 | Fill #0

## 2019-07-12 NOTE — Care Management Important Message (Signed)
Important Message  Patient Details  Name: Misty Green MRN: [id number] Date of Birth: 1970/08/28   Medicare Important Message Given:  Yes     Shacarra Choe Montine Circle 07/12/2019, 12:58 PM

## 2019-07-12 NOTE — Progress Notes (Signed)
Patient ambulated with 4 Liters O2.  Sat dropped to 88% on 4L.  Patient able to stand still and recover to 92% on 4L.  MD made aware of above.  MD stated, "She's good to go."

## 2019-07-12 NOTE — Discharge Summary (Signed)
Physician Discharge Summary  Misty Green SRP:[id number] DOB: December 31, 1970 DOA: 07/08/2019  PCP: Ronnell Freshwater, NP  Admit date: 07/08/2019 Discharge date: 07/12/2019  Admitted From: Home Disposition: Home  Recommendations for Outpatient Follow-up:  1. Follow ups as below. 2. Please obtain CBC/BMP/Mag at follow up 3. Recommend outpatient palliative follow-up 4. Please follow up on the following pending results: None  Home Health: PT/OT Equipment/Devices: Home oxygen  Discharge Condition: Stable CODE STATUS: Full code   Hospital Course: Brief Narrative / Interim history: 49 year old with history of OSA/COPD/chronic RF on 4 L followed by pulmonology in [place], colostomy, ongoing cigarette smoker, anxiety, neuropathy and GERD who presented with increasing lethargy, hypoxemia to 80% on 4 L and admitted to ICU on 6/20. ABG 7.1/>120/60/52. Patient was placed on BiPAP with improvement in her respiratory status.  Eventually, she improved and transferred to [facility] service.  Patient was weaned to 2 L by nasal cannula at rest but requiring 4 L to keep saturation above 88% with activity. Discharged on prednisone 40 mg daily for 3 more days, home Trelegy Ellipta and as needed DuoNeb. Patient has home oxygen and BiPAP at home. Recommended outpatient follow-up with her PCP and pulmonologist. Also discussed the importance of minimal oxygen use, adverse effects of sedating medications and importance of smoking cessation. Provided with prescription for nicotine patch as well.  See individual problem list below for more hospital course.  Discharge Diagnoses:  Acute on chronic respiratory failure with hypoxia and hypercapnia-multifactorial-COPD exacerbation, OSA and polypharmacy. -ABG 7.1/>120/60/52 (on admit)>> 7.37/75/133/42 (today).  92% on 2 L at rest but requiring 4 L with ambulation to keep saturation above 88%. -Prednisone 40 mg for 3 days -Continue home BiPAP and Trelegy  Ellipta  Acute on chronic COPD exacerbation: likely stage D COPD -Continue prednisone, LABA/LAMA/ICS, Daliresp and DuoNeb -Smoking cessation  OSA/possible OHS -Nightly BiPAP  Chronic diastolic CHF: Appears euvolemic. -Continue p.o. Lasix  Anxiety/insomnia/neuropathic pain -Continue home Xanax, gabapentin and Seroquel at reduced dose -Continue home Lexapro  GERD -Continue home PPI  Essential hypertension: Normotensive -Continue home Norvasc  Colostomy due to diverticulitis -Continue colostomy care  Constipation -On daily MiraLAX.  Morbid obesity: -Encourage lifestyle change to lose weight  Tobacco use disorder: Smokes about 4 to 5 cigarettes a day. -Encouraged cessation -Continue nicotine patch   Goal of care: Patient with end-stage COPD and other comorbidities as above. Poor prognosis. Might qualify for hospice. Recommend outpatient palliative referral.   There is no height or weight on file to calculate BMI.            Discharge Exam: Vitals:   07/12/19 0807 07/12/19 0810  BP:    Pulse:  71  Resp:  18  Temp:    SpO2: 91% 91%    GENERAL: No apparent distress.  Nontoxic. HEENT: MMM.  Vision and hearing grossly intact.  NECK: Supple.  No apparent JVD.  RESP: 92% on 2 L. No IWOB. Diminished aeration. CVS:  RRR. Heart sounds normal.  ABD/GI/GU: Bowel sounds present. Soft. Non tender.  MSK/EXT:  Moves extremities. No apparent deformity. No edema.  SKIN: no apparent skin lesion or wound NEURO: Awake, alert and oriented appropriately.  No apparent focal neuro deficit. PSYCH: Calm. Normal affect.  Discharge Instructions  Discharge Instructions    Call MD for:  difficulty breathing, headache or visual disturbances   Complete by: As directed    Call MD for:  extreme fatigue   Complete by: As directed    Diet - low sodium  heart healthy   Complete by: As directed    Discharge instructions   Complete by: As directed    It has been a pleasure  taking care of you!  You were hospitalized and treated for respiratory failure due to COPD exacerbation.  Your symptoms improved to the point with think it is safe to let you go home and continue your medications.  We strongly recommend quitting smoking cigarettes.  We have also made adjustment to your home medications that could affect your breathing.  Please review your new medication list and the directions before you take your medications.  It is also very important that you use the minimum amount of oxygen just to keep your oxygen level above 88%.  You may increase your oxygen level to 6 L when you get up to move or exert yourself.  Please follow-up with your primary care doctor and your pulmonologist in 1 to 2 weeks.  You may have to make a phone call as soon as possible to schedule these appointments.   Take care,   Increase activity slowly   Complete by: As directed      Allergies as of 07/12/2019      Reactions   Other Nausea And Vomiting   Anti-depressent that starts with t      Medication List    STOP taking these medications   gabapentin 800 MG tablet Commonly known as: NEURONTIN Replaced by: gabapentin 400 MG capsule   methocarbamol 500 MG tablet Commonly known as: ROBAXIN   pantoprazole 40 MG tablet Commonly known as: PROTONIX     TAKE these medications   acetaminophen 325 MG tablet Commonly known as: TYLENOL Take 1-2 tablets (325-650 mg total) by mouth every 4 (four) hours as needed for mild pain. What changed:   how much to take  when to take this   albuterol 108 (90 Base) MCG/ACT inhaler Commonly known as: VENTOLIN HFA Inhale 2 puffs into the lungs every 6 (six) hours as needed for wheezing or shortness of breath.   albuterol (2.5 MG/3ML) 0.083% nebulizer solution Commonly known as: PROVENTIL Take 2.5 mg by nebulization every 6 (six) hours as needed for wheezing or shortness of breath. Inhale one vial via nebulizer 4 times a day as directed.    ALPRAZolam 0.25 MG tablet Commonly known as: XANAX Take 1 tablet (0.25 mg total) by mouth at bedtime as needed for anxiety. What changed:   when to take this  reasons to take this   amLODipine 10 MG tablet Commonly known as: NORVASC Take 1 tablet (10 mg total) by mouth daily.   escitalopram 20 MG tablet Commonly known as: LEXAPRO Take 1 tablet (20 mg total) by mouth daily.   furosemide 20 MG tablet Commonly known as: LASIX Take 1 tablet (20 mg total) by mouth daily. What changed: when to take this   gabapentin 400 MG capsule Commonly known as: NEURONTIN Take 1 capsule (400 mg total) by mouth 3 (three) times daily. Replaces: gabapentin 800 MG tablet   Melatonin 10 MG Tabs Take 30 mg by mouth at bedtime.   montelukast 10 MG tablet Commonly known as: SINGULAIR Take 1 tablet (10 mg total) by mouth at bedtime.   multivitamin with minerals tablet Take 1 tablet by mouth at bedtime.   nicotine 14 mg/24hr patch Commonly known as: NICODERM CQ - dosed in mg/24 hours Place 1 patch (14 mg total) onto the skin daily.   polyethylene glycol 17 g packet Commonly known as: MIRALAX /  GLYCOLAX Take 17 g by mouth 2 (two) times daily. What changed:   when to take this  additional instructions   predniSONE 20 MG tablet Commonly known as: DELTASONE Take 2 tablets (40 mg total) by mouth daily with breakfast.   PRESCRIPTION MEDICATION Inhale into the lungs at bedtime. BiPap   QUEtiapine 50 MG tablet Commonly known as: SEROQUEL Take 1 tablet (50 mg total) by mouth at bedtime.   roflumilast 500 MCG Tabs tablet Commonly known as: DALIRESP Take 1 tablet (500 mcg total) by mouth daily.   Trelegy Ellipta 100-62.5-25 MCG/INH Aepb Generic drug: Fluticasone-Umeclidin-Vilant Inhale 1 each into the lungs daily. What changed: how much to take       Consultations:  PCCM  Procedures/Studies:   DG Chest Portable 1 View  Result Date: 07/08/2019 CLINICAL DATA:  Shortness of  breath. EXAM: PORTABLE CHEST 1 VIEW COMPARISON:  Jun 08, 2018 FINDINGS: Stable cardiomegaly. The hila and mediastinum are normal. No pneumothorax. No pulmonary nodules or masses. No focal infiltrates. Minimal opacity in the lateral left lung base is stable, likely atelectasis. IMPRESSION: Cardiomegaly. Mild atelectasis in the lateral left lung base. No other acute abnormalities. Electronically Signed   By: Dorise Bullion III M.D   On: 07/08/2019 13:26        The results of significant diagnostics from this hospitalization (including imaging, microbiology, ancillary and laboratory) are listed below for reference.     Microbiology: Recent Results (from the past 240 hour(s))  SARS Coronavirus 2 by RT PCR (hospital order, performed in [facility] hospital lab) Nasopharyngeal Nasopharyngeal Swab     Status: None   Collection Time: 07/08/19 12:39 PM   Specimen: Nasopharyngeal Swab  Result Value Ref Range Status   SARS Coronavirus 2 NEGATIVE NEGATIVE Final    Comment: (NOTE) SARS-CoV-2 target nucleic acids are NOT DETECTED.  The SARS-CoV-2 RNA is generally detectable in upper and lower respiratory specimens during the acute phase of infection. The lowest concentration of SARS-CoV-2 viral copies this assay can detect is 250 copies / mL. A negative result does not preclude SARS-CoV-2 infection and should not be used as the sole basis for treatment or other patient management decisions.  A negative result may occur with improper specimen collection / handling, submission of specimen other than nasopharyngeal swab, presence of viral mutation(s) within the areas targeted by this assay, and inadequate number of viral copies (<250 copies / mL). A negative result must be combined with clinical observations, patient history, and epidemiological information.  Fact Sheet for Patients:   StrictlyIdeas.no  Fact Sheet for Healthcare  Providers: BankingDealers.co.za  This test is not yet approved or  cleared by the [place] FDA and has been authorized for detection and/or diagnosis of SARS-CoV-2 by FDA under an Emergency Use Authorization (EUA).  This EUA will remain in effect (meaning this test can be used) for the duration of the COVID-19 declaration under Section 564(b)(1) of the Act, 21 U.S.C. section 360bbb-3(b)(1), unless the authorization is terminated or revoked sooner.  Performed at [facility], [place] [address] [place], [place] [address]      Labs: BNP (last 3 results) Recent Labs    07/09/19 0306  BNP 70.3   Basic Metabolic Panel: Recent Labs  Lab 07/08/19 1241 07/08/19 1241 07/08/19 1508 07/08/19 2210 07/09/19 0306 07/09/19 0528 07/10/19 0757 07/11/19 0333  NA 147*   < >  --  140 143 140 142 143  K 4.2   < >  --  2.9*  3.5 3.2* 3.9 4.4  CL 94*  --   --   --  89*  --  89* 95*  CO2 45*  --   --   --  43*  --  45* 40*  GLUCOSE 108*  --   --   --  106*  --  87 98  BUN 8  --   --   --  7  --  18 19  CREATININE 0.43*  --   --   --  0.41*  --  0.56 0.50  CALCIUM 8.7*  --   --   --  8.8*  --  9.1 9.1  MG  --   --  2.2  --  2.1  --  1.9  --   PHOS  --   --  3.1  --  2.8  --  3.8  --    < > = values in this interval not displayed.   Liver Function Tests: Recent Labs  Lab 07/08/19 1241  AST 25  ALT 14  ALKPHOS 50  BILITOT 1.1  PROT 6.7  ALBUMIN 3.4*   No results for input(s): LIPASE, AMYLASE in the last 168 hours. No results for input(s): AMMONIA in the last 168 hours. CBC: Recent Labs  Lab 07/08/19 1241 07/08/19 1241 07/08/19 2210 07/09/19 0306 07/09/19 0528 07/10/19 0757 07/11/19 0333  WBC 7.7  --   --  5.9  --  7.5 6.5  NEUTROABS 4.4  --   --   --   --   --   --   HGB 13.7   < > 15.0 13.0 14.6 12.3 12.8  HCT 49.3*   < > 44.0 46.0 43.0 42.1 43.2  MCV 110.5*  --   --  106.7*  --  104.5* 103.1*  PLT 170  --   --  171  --  168 169   < > =  values in this interval not displayed.   Cardiac Enzymes: No results for input(s): CKTOTAL, CKMB, CKMBINDEX, TROPONINI in the last 168 hours. BNP: Invalid input(s): POCBNP CBG: No results for input(s): GLUCAP in the last 168 hours. D-Dimer No results for input(s): DDIMER in the last 72 hours. Hgb A1c No results for input(s): HGBA1C in the last 72 hours. Lipid Profile No results for input(s): CHOL, HDL, LDLCALC, TRIG, CHOLHDL, LDLDIRECT in the last 72 hours. Thyroid function studies No results for input(s): TSH, T4TOTAL, T3FREE, THYROIDAB in the last 72 hours.  Invalid input(s): FREET3 Anemia work up No results for input(s): VITAMINB12, FOLATE, FERRITIN, TIBC, IRON, RETICCTPCT in the last 72 hours. Urinalysis    Component Value Date/Time   COLORURINE STRAW (A) 07/09/2019 0337   APPEARANCEUR CLEAR 07/09/2019 0337   LABSPEC 1.005 07/09/2019 0337   PHURINE 5.0 07/09/2019 0337   GLUCOSEU NEGATIVE 07/09/2019 0337   HGBUR SMALL (A) 07/09/2019 0337   BILIRUBINUR NEGATIVE 07/09/2019 0337   KETONESUR NEGATIVE 07/09/2019 0337   PROTEINUR 100 (A) 07/09/2019 0337   NITRITE NEGATIVE 07/09/2019 0337   LEUKOCYTESUR NEGATIVE 07/09/2019 0337   Sepsis Labs Invalid input(s): PROCALCITONIN,  WBC,  LACTICIDVEN   Time coordinating discharge: 40 minutes  SIGNED:  Mercy Riding, MD  [facility] 07/12/2019, 4:56 PM  If 7PM-7AM, please contact night-coverage [email or web address] Password [email or web address]

## 2019-07-12 NOTE — Progress Notes (Signed)
Patient discharged per MD orders.  Float nurses to unit to assist with discharge.

## 2019-07-12 NOTE — TOC Transition Note (Signed)
Transition of Care [facility]) - CM/SW Discharge Note   Patient Details  Name: AIBHLINN KALMAR MRN: [id number] Date of Birth: 1970/12/28  [facility] CM/SW Contact:  Angelita Ingles, RN Phone Number: [phone]  07/12/2019, 12:36 PM   Clinical Narrative:    CM observed patient being wheeled out as a discharge. CM stopped patient and nurse to inquire about O2 orders and is patient requires Home health services. Both patient and bedside Nurse state that the MD has decided that there is no Home health needed and that O2 requirements have not changed. Bedside nurse states that O2 sats has been checked and the patient only dropped to 88% not requiring an oxygen increase. Cm will sign off due to bedside nurse discharging patient and stating patient is clear to discharge home.           Patient Goals and CMS Choice        Discharge Placement                       Discharge Plan and Services                                     Social Determinants of Health (SDOH) Interventions     Readmission Risk Interventions Readmission Risk Prevention Plan 05/23/2018 05/15/2018  Transportation Screening Complete (No Data)  Medication Review [employer]) Complete -  PCP or Specialist appointment within 3-5 days of discharge Complete -  [place] or Home Care Consult Complete -  SW Recovery Care/Counseling Consult Complete -  [place] Patient Refused -  Some recent data might be hidden

## 2019-07-13 ENCOUNTER — Ambulatory Visit: Payer: 59 | Admitting: Nurse Practitioner

## 2019-07-13 ENCOUNTER — Telehealth: Payer: Self-pay

## 2019-07-13 NOTE — Telephone Encounter (Signed)
Patient was advised on 07/09/19 that she needed to keep her appointment for today 07/13/19 or she would be discharged from office due to non compliant with appointments, I called patient and verbally told her we would cancel any future ones she had with office, pt understood. Beth

## 2019-07-16 ENCOUNTER — Other Ambulatory Visit: Payer: Self-pay

## 2019-07-16 MED ORDER — TRELEGY ELLIPTA 100-62.5-25 MCG/INH IN AEPB: 1.0000 | INHALATION_SPRAY | Freq: Every day | RESPIRATORY_TRACT | 0 refills | Status: DC

## 2019-07-19 ENCOUNTER — Other Ambulatory Visit: Payer: Self-pay

## 2019-07-19 MED ORDER — TRELEGY ELLIPTA 100-62.5-25 MCG/INH IN AEPB: 1.0000 | INHALATION_SPRAY | Freq: Every day | RESPIRATORY_TRACT | 0 refills | Status: DC

## 2019-07-20 ENCOUNTER — Other Ambulatory Visit: Payer: Self-pay | Admitting: Adult Health

## 2019-07-20 DIAGNOSIS — F068 Other specified mental disorders due to known physiological condition: Secondary | ICD-10-CM

## 2019-07-20 NOTE — Telephone Encounter (Signed)
Only 30 days

## 2019-07-24 ENCOUNTER — Encounter: Payer: Self-pay | Admitting: Internal Medicine

## 2019-07-24 NOTE — Progress Notes (Unsigned)
Mailed and scanned in physician discharge letter from [facility].

## 2019-07-25 ENCOUNTER — Ambulatory Visit: Payer: 59 | Admitting: Internal Medicine

## 2019-07-26 ENCOUNTER — Other Ambulatory Visit: Payer: Self-pay

## 2019-07-26 ENCOUNTER — Ambulatory Visit: Payer: 59 | Admitting: Internal Medicine

## 2019-07-26 DIAGNOSIS — J302 Other seasonal allergic rhinitis: Secondary | ICD-10-CM

## 2019-07-27 ENCOUNTER — Other Ambulatory Visit: Payer: Self-pay | Admitting: Nurse Practitioner

## 2019-07-30 ENCOUNTER — Other Ambulatory Visit: Payer: Self-pay | Admitting: Adult Health

## 2019-07-30 DIAGNOSIS — J302 Other seasonal allergic rhinitis: Secondary | ICD-10-CM

## 2019-07-30 NOTE — Telephone Encounter (Signed)
We did but we have to legally refill medications for 30 days. I dont think that applies to controlled tho. Please approve for 30 days refills

## 2019-07-30 NOTE — Telephone Encounter (Signed)
Did we discharge her as a patient?

## 2019-08-02 ENCOUNTER — Ambulatory Visit: Admit: 2019-08-02 | Payer: 59 | Admitting: Internal Medicine

## 2019-08-02 SURGERY — COLONOSCOPY WITH PROPOFOL
Anesthesia: General

## 2019-08-22 ENCOUNTER — Other Ambulatory Visit: Payer: Self-pay | Admitting: Internal Medicine

## 2019-08-22 DIAGNOSIS — R911 Solitary pulmonary nodule: Secondary | ICD-10-CM

## 2019-08-25 ENCOUNTER — Other Ambulatory Visit: Payer: Self-pay | Admitting: Adult Health

## 2019-08-26 ENCOUNTER — Other Ambulatory Visit: Payer: Self-pay | Admitting: Adult Health

## 2019-09-10 ENCOUNTER — Ambulatory Visit: Payer: 59

## 2019-09-21 ENCOUNTER — Other Ambulatory Visit: Payer: Self-pay | Admitting: Internal Medicine

## 2019-09-25 ENCOUNTER — Other Ambulatory Visit: Payer: Self-pay | Admitting: Internal Medicine

## 2019-10-12 ENCOUNTER — Other Ambulatory Visit: Payer: Self-pay | Admitting: Internal Medicine

## 2019-10-13 ENCOUNTER — Other Ambulatory Visit: Payer: Self-pay | Admitting: Internal Medicine

## 2019-11-25 ENCOUNTER — Other Ambulatory Visit: Payer: Self-pay | Admitting: Internal Medicine

## 2020-01-22 ENCOUNTER — Other Ambulatory Visit: Payer: Self-pay

## 2020-01-22 ENCOUNTER — Inpatient Hospital Stay
Admission: EM | Admit: 2020-01-22 | Discharge: 2020-02-11 | DRG: 207 | Disposition: A | Discharge status: Skilled Nursing Facility | Payer: 59 | Attending: Internal Medicine | Admitting: Internal Medicine

## 2020-01-22 ENCOUNTER — Encounter: Payer: Self-pay | Admitting: Internal Medicine

## 2020-01-22 ENCOUNTER — Emergency Department: Payer: 59

## 2020-01-22 DIAGNOSIS — J9621 Acute and chronic respiratory failure with hypoxia: Secondary | ICD-10-CM | POA: Diagnosis present

## 2020-01-22 DIAGNOSIS — J449 Chronic obstructive pulmonary disease, unspecified: Secondary | ICD-10-CM | POA: Diagnosis present

## 2020-01-22 DIAGNOSIS — F1110 Opioid abuse, uncomplicated: Secondary | ICD-10-CM | POA: Diagnosis present

## 2020-01-22 DIAGNOSIS — J441 Chronic obstructive pulmonary disease with (acute) exacerbation: Secondary | ICD-10-CM | POA: Diagnosis present

## 2020-01-22 DIAGNOSIS — I5043 Acute on chronic combined systolic (congestive) and diastolic (congestive) heart failure: Secondary | ICD-10-CM | POA: Diagnosis present

## 2020-01-22 DIAGNOSIS — K219 Gastro-esophageal reflux disease without esophagitis: Secondary | ICD-10-CM | POA: Diagnosis present

## 2020-01-22 DIAGNOSIS — R0602 Shortness of breath: Secondary | ICD-10-CM | POA: Diagnosis present

## 2020-01-22 DIAGNOSIS — E874 Mixed disorder of acid-base balance: Secondary | ICD-10-CM | POA: Diagnosis present

## 2020-01-22 DIAGNOSIS — J309 Allergic rhinitis, unspecified: Secondary | ICD-10-CM | POA: Diagnosis present

## 2020-01-22 DIAGNOSIS — Z23 Encounter for immunization: Secondary | ICD-10-CM

## 2020-01-22 DIAGNOSIS — J9600 Acute respiratory failure, unspecified whether with hypoxia or hypercapnia: Secondary | ICD-10-CM

## 2020-01-22 DIAGNOSIS — N179 Acute kidney failure, unspecified: Secondary | ICD-10-CM | POA: Diagnosis present

## 2020-01-22 DIAGNOSIS — F419 Anxiety disorder, unspecified: Secondary | ICD-10-CM | POA: Diagnosis not present

## 2020-01-22 DIAGNOSIS — Z720 Tobacco use: Secondary | ICD-10-CM | POA: Diagnosis present

## 2020-01-22 DIAGNOSIS — E876 Hypokalemia: Secondary | ICD-10-CM | POA: Diagnosis present

## 2020-01-22 DIAGNOSIS — M549 Dorsalgia, unspecified: Secondary | ICD-10-CM | POA: Diagnosis present

## 2020-01-22 DIAGNOSIS — Z6837 Body mass index (BMI) 37.0-37.9, adult: Secondary | ICD-10-CM

## 2020-01-22 DIAGNOSIS — Z90710 Acquired absence of both cervix and uterus: Secondary | ICD-10-CM

## 2020-01-22 DIAGNOSIS — R0603 Acute respiratory distress: Secondary | ICD-10-CM | POA: Diagnosis not present

## 2020-01-22 DIAGNOSIS — Z4659 Encounter for fitting and adjustment of other gastrointestinal appliance and device: Secondary | ICD-10-CM

## 2020-01-22 DIAGNOSIS — J9622 Acute and chronic respiratory failure with hypercapnia: Secondary | ICD-10-CM | POA: Diagnosis present

## 2020-01-22 DIAGNOSIS — Z888 Allergy status to other drugs, medicaments and biological substances status: Secondary | ICD-10-CM

## 2020-01-22 DIAGNOSIS — Z8674 Personal history of sudden cardiac arrest: Secondary | ICD-10-CM | POA: Diagnosis not present

## 2020-01-22 DIAGNOSIS — Z20822 Contact with and (suspected) exposure to covid-19: Secondary | ICD-10-CM | POA: Diagnosis present

## 2020-01-22 DIAGNOSIS — I5033 Acute on chronic diastolic (congestive) heart failure: Secondary | ICD-10-CM | POA: Diagnosis not present

## 2020-01-22 DIAGNOSIS — E669 Obesity, unspecified: Secondary | ICD-10-CM | POA: Diagnosis present

## 2020-01-22 DIAGNOSIS — Z978 Presence of other specified devices: Secondary | ICD-10-CM

## 2020-01-22 DIAGNOSIS — Z7952 Long term (current) use of systemic steroids: Secondary | ICD-10-CM

## 2020-01-22 DIAGNOSIS — Z9049 Acquired absence of other specified parts of digestive tract: Secondary | ICD-10-CM

## 2020-01-22 DIAGNOSIS — Z79899 Other long term (current) drug therapy: Secondary | ICD-10-CM

## 2020-01-22 DIAGNOSIS — Z825 Family history of asthma and other chronic lower respiratory diseases: Secondary | ICD-10-CM

## 2020-01-22 DIAGNOSIS — J9601 Acute respiratory failure with hypoxia: Secondary | ICD-10-CM | POA: Diagnosis not present

## 2020-01-22 DIAGNOSIS — F17210 Nicotine dependence, cigarettes, uncomplicated: Secondary | ICD-10-CM | POA: Diagnosis present

## 2020-01-22 DIAGNOSIS — F411 Generalized anxiety disorder: Secondary | ICD-10-CM | POA: Diagnosis present

## 2020-01-22 DIAGNOSIS — F32A Depression, unspecified: Secondary | ICD-10-CM | POA: Diagnosis present

## 2020-01-22 DIAGNOSIS — G8929 Other chronic pain: Secondary | ICD-10-CM | POA: Diagnosis present

## 2020-01-22 DIAGNOSIS — G4733 Obstructive sleep apnea (adult) (pediatric): Secondary | ICD-10-CM | POA: Diagnosis present

## 2020-01-22 DIAGNOSIS — Z933 Colostomy status: Secondary | ICD-10-CM | POA: Diagnosis not present

## 2020-01-22 DIAGNOSIS — I5021 Acute systolic (congestive) heart failure: Secondary | ICD-10-CM

## 2020-01-22 DIAGNOSIS — G928 Other toxic encephalopathy: Secondary | ICD-10-CM | POA: Diagnosis present

## 2020-01-22 DIAGNOSIS — Z9981 Dependence on supplemental oxygen: Secondary | ICD-10-CM

## 2020-01-22 DIAGNOSIS — Z01818 Encounter for other preprocedural examination: Secondary | ICD-10-CM

## 2020-01-22 DIAGNOSIS — Z7951 Long term (current) use of inhaled steroids: Secondary | ICD-10-CM

## 2020-01-22 LAB — CBC WITH DIFFERENTIAL/PLATELET
Abs Immature Granulocytes: 0.09 10*3/uL — ABNORMAL HIGH (ref 0.00–0.07)
Basophils Absolute: 0 10*3/uL (ref 0.0–0.1)
Basophils Relative: 0 %
Eosinophils Absolute: 0 10*3/uL (ref 0.0–0.5)
Eosinophils Relative: 0 %
HCT: 45.6 % (ref 36.0–46.0)
Hemoglobin: 13.6 g/dL (ref 12.0–15.0)
Immature Granulocytes: 1 %
Lymphocytes Relative: 15 %
Lymphs Abs: 1.2 10*3/uL (ref 0.7–4.0)
MCH: 30.4 pg (ref 26.0–34.0)
MCHC: 29.8 g/dL — ABNORMAL LOW (ref 30.0–36.0)
MCV: 101.8 fL — ABNORMAL HIGH (ref 80.0–100.0)
Monocytes Absolute: 0.5 10*3/uL (ref 0.1–1.0)
Monocytes Relative: 6 %
Neutro Abs: 6.3 10*3/uL (ref 1.7–7.7)
Neutrophils Relative %: 78 %
Platelets: 225 10*3/uL (ref 150–400)
RBC: 4.48 MIL/uL (ref 3.87–5.11)
RDW: 11.6 % (ref 11.5–15.5)
WBC: 8.1 10*3/uL (ref 4.0–10.5)
nRBC: 0 % (ref 0.0–0.2)

## 2020-01-22 LAB — COMPREHENSIVE METABOLIC PANEL
ALT: 18 U/L (ref 0–44)
AST: 32 U/L (ref 15–41)
Albumin: <1 g/dL — ABNORMAL LOW (ref 3.5–5.0)
Alkaline Phosphatase: 48 U/L (ref 38–126)
Anion gap: 14 (ref 5–15)
BUN: 15 mg/dL (ref 6–20)
CO2: 41 mmol/L — ABNORMAL HIGH (ref 22–32)
Calcium: 9.6 mg/dL (ref 8.9–10.3)
Chloride: 86 mmol/L — ABNORMAL LOW (ref 98–111)
Creatinine, Ser: 0.52 mg/dL (ref 0.44–1.00)
GFR, Estimated: >60 mL/min (ref >60)
Glucose, Bld: 140 mg/dL — ABNORMAL HIGH (ref 70–99)
Potassium: 4.4 mmol/L (ref 3.5–5.1)
Sodium: 141 mmol/L (ref 135–145)
Total Bilirubin: 0.3 mg/dL (ref 0.3–1.2)
Total Protein: <3 g/dL — ABNORMAL LOW (ref 6.5–8.1)

## 2020-01-22 LAB — TROPONIN I (HIGH SENSITIVITY)
Troponin I (High Sensitivity): 8 ng/L (ref <18)
Troponin I (High Sensitivity): 9 ng/L (ref <18)

## 2020-01-22 LAB — RESP PANEL BY RT-PCR (FLU A&B, COVID) ARPGX2
Influenza A by PCR: NEGATIVE
Influenza B by PCR: NEGATIVE
SARS Coronavirus 2 by RT PCR: NEGATIVE

## 2020-01-22 LAB — BLOOD GAS, VENOUS
Acid-Base Excess: 24.9 mmol/L — ABNORMAL HIGH (ref 0.0–2.0)
Bicarbonate: 58.7 mmol/L — ABNORMAL HIGH (ref 20.0–28.0)
O2 Saturation: 93.8 %
Patient temperature: 37
pCO2, Ven: >120 mmHg (ref 44.0–60.0)
pH, Ven: 7.28 (ref 7.250–7.430)
pO2, Ven: 78 mmHg — ABNORMAL HIGH (ref 32.0–45.0)

## 2020-01-22 LAB — LACTIC ACID, PLASMA: Lactic Acid, Venous: 2.1 mmol/L (ref 0.5–1.9)

## 2020-01-22 LAB — BRAIN NATRIURETIC PEPTIDE: B Natriuretic Peptide: 58 pg/mL (ref 0.0–100.0)

## 2020-01-22 MED ORDER — AMLODIPINE BESYLATE 10 MG PO TABS: 10.0000 mg | ORAL_TABLET | Freq: Every day | ORAL | Status: DC

## 2020-01-22 MED ORDER — MONTELUKAST SODIUM 10 MG PO TABS
10.0000 mg | ORAL_TABLET | Freq: Every day | ORAL | Status: DC
  Administered 2020-01-23: 10 mg via ORAL
  Filled 2020-01-22: qty 1

## 2020-01-22 MED ORDER — FUROSEMIDE 10 MG/ML IJ SOLN
40.0000 mg | Freq: Two times a day (BID) | INTRAMUSCULAR | Status: DC
  Administered 2020-01-22 – 2020-01-24 (×4): 40 mg via INTRAVENOUS
  Filled 2020-01-22 (×3): qty 4

## 2020-01-22 MED ORDER — QUETIAPINE FUMARATE 25 MG PO TABS
50.0000 mg | ORAL_TABLET | Freq: Every day | ORAL | Status: DC
  Administered 2020-01-23: 50 mg via ORAL
  Filled 2020-01-22: qty 2

## 2020-01-22 MED ORDER — MAGNESIUM SULFATE 2 GM/50ML IV SOLN
2.0000 g | Freq: Once | INTRAVENOUS | Status: AC
  Administered 2020-01-22: 2 g via INTRAVENOUS
  Filled 2020-01-22: qty 50

## 2020-01-22 MED ORDER — ACETAMINOPHEN 650 MG RE SUPP: 650.0000 mg | Freq: Four times a day (QID) | RECTAL | Status: DC | PRN

## 2020-01-22 MED ORDER — ACETAMINOPHEN 325 MG PO TABS: 650.0000 mg | ORAL_TABLET | Freq: Four times a day (QID) | ORAL | Status: DC | PRN

## 2020-01-22 MED ORDER — ESCITALOPRAM OXALATE 10 MG PO TABS
20.0000 mg | ORAL_TABLET | Freq: Every day | ORAL | Status: DC
  Filled 2020-01-22: qty 2

## 2020-01-22 MED ORDER — FUROSEMIDE 10 MG/ML IJ SOLN
40.0000 mg | Freq: Once | INTRAMUSCULAR | Status: AC
  Administered 2020-01-22: 40 mg via INTRAVENOUS
  Filled 2020-01-22: qty 4

## 2020-01-22 MED ORDER — MELATONIN 5 MG PO TABS
5.0000 mg | ORAL_TABLET | Freq: Every evening | ORAL | Status: DC | PRN
  Administered 2020-01-23 (×2): 5 mg via ORAL
  Filled 2020-01-22 (×2): qty 1

## 2020-01-22 MED ORDER — IPRATROPIUM-ALBUTEROL 0.5-2.5 (3) MG/3ML IN SOLN
3.0000 mL | Freq: Once | RESPIRATORY_TRACT | Status: AC
  Administered 2020-01-22: 3 mL via RESPIRATORY_TRACT
  Filled 2020-01-22: qty 3

## 2020-01-22 MED ORDER — NICOTINE 14 MG/24HR TD PT24
14.0000 mg | MEDICATED_PATCH | Freq: Every day | TRANSDERMAL | Status: DC | PRN
  Filled 2020-01-22: qty 1

## 2020-01-22 MED ORDER — FLUTICASONE PROPIONATE 50 MCG/ACT NA SUSP
1.0000 | Freq: Every day | NASAL | Status: DC
  Administered 2020-01-24 – 2020-02-10 (×15): 1 via NASAL
  Filled 2020-01-22 (×2): qty 16

## 2020-01-22 MED ORDER — HYDROCODONE-ACETAMINOPHEN 5-325 MG PO TABS
1.0000 | ORAL_TABLET | Freq: Four times a day (QID) | ORAL | Status: DC | PRN
Start: 2020-01-22 — End: 2020-01-24
  Administered 2020-01-22: 1 via ORAL
  Filled 2020-01-22: qty 1

## 2020-01-22 MED ORDER — LORAZEPAM 2 MG/ML IJ SOLN
0.5000 mg | Freq: Four times a day (QID) | INTRAMUSCULAR | Status: DC | PRN
  Administered 2020-01-22: 0.5 mg via INTRAVENOUS
  Filled 2020-01-22: qty 1

## 2020-01-22 MED ORDER — ONDANSETRON HCL 4 MG/2ML IJ SOLN
4.0000 mg | Freq: Four times a day (QID) | INTRAMUSCULAR | Status: DC | PRN
  Administered 2020-02-06: 4 mg via INTRAVENOUS
  Filled 2020-01-22: qty 2

## 2020-01-22 MED ORDER — ACETAMINOPHEN 500 MG PO TABS
1000.0000 mg | ORAL_TABLET | Freq: Once | ORAL | Status: AC
  Administered 2020-01-22: 1000 mg via ORAL
  Filled 2020-01-22: qty 2

## 2020-01-22 MED ORDER — LORAZEPAM 2 MG/ML IJ SOLN: 0.5000 mg | Freq: Four times a day (QID) | INTRAMUSCULAR | Status: DC | PRN

## 2020-01-22 MED ORDER — METHYLPREDNISOLONE SODIUM SUCC 125 MG IJ SOLR
125.0000 mg | Freq: Once | INTRAMUSCULAR | Status: AC
  Administered 2020-01-22: 125 mg via INTRAVENOUS
  Filled 2020-01-22: qty 2

## 2020-01-22 MED ORDER — ENOXAPARIN SODIUM 60 MG/0.6ML ~~LOC~~ SOLN
0.5000 mg/kg | SUBCUTANEOUS | Status: DC
  Administered 2020-01-23 – 2020-01-27 (×5): 50 mg via SUBCUTANEOUS
  Filled 2020-01-22 (×9): qty 0.6

## 2020-01-22 MED ORDER — ROFLUMILAST 500 MCG PO TABS
500.0000 ug | ORAL_TABLET | Freq: Every day | ORAL | Status: DC
  Filled 2020-01-22 (×2): qty 1

## 2020-01-22 MED ORDER — METHYLPREDNISOLONE SODIUM SUCC 40 MG IJ SOLR
40.0000 mg | Freq: Four times a day (QID) | INTRAMUSCULAR | Status: DC
  Administered 2020-01-22 – 2020-01-28 (×23): 40 mg via INTRAVENOUS
  Filled 2020-01-22 (×23): qty 1

## 2020-01-22 MED ORDER — KETOROLAC TROMETHAMINE 30 MG/ML IJ SOLN
15.0000 mg | Freq: Once | INTRAMUSCULAR | Status: AC
  Administered 2020-01-22: 15 mg via INTRAVENOUS
  Filled 2020-01-22: qty 1

## 2020-01-22 MED ORDER — ALBUTEROL SULFATE (2.5 MG/3ML) 0.083% IN NEBU: 2.5000 mg | INHALATION_SOLUTION | RESPIRATORY_TRACT | Status: DC | PRN

## 2020-01-22 MED ORDER — ADULT MULTIVITAMIN W/MINERALS CH
1.0000 | ORAL_TABLET | Freq: Every day | ORAL | Status: DC
  Administered 2020-01-22 – 2020-01-23 (×2): 1 via ORAL
  Filled 2020-01-22 (×2): qty 1

## 2020-01-22 MED ORDER — IPRATROPIUM-ALBUTEROL 0.5-2.5 (3) MG/3ML IN SOLN
3.0000 mL | Freq: Four times a day (QID) | RESPIRATORY_TRACT | Status: DC
  Administered 2020-01-22 – 2020-02-08 (×64): 3 mL via RESPIRATORY_TRACT
  Filled 2020-01-22 (×61): qty 3

## 2020-01-22 NOTE — Progress Notes (Signed)
PHARMACIST - PHYSICIAN COMMUNICATION  CONCERNING:  Enoxaparin (Lovenox) for DVT Prophylaxis    RECOMMENDATION: Patient was prescribed enoxaprin 40mg  q24 hours for VTE prophylaxis.   Filed Weights   01/22/20 1347  Weight: 97.5 kg (215 lb)    Body mass index is 36.9 kg/m.  Estimated Creatinine Clearance: 95.4 mL/min (by C-G formula based on SCr of 0.52 mg/dL).   Based on [place] patient is candidate for enoxaparin 0.5mg /kg TBW SQ every 24 hours based on BMI being >30 and CrCl >72mL/min  DESCRIPTION: Pharmacy has adjusted enoxaparin dose per [facility] policy.  Patient is now receiving enoxaparin 50 mg every 24 hours    Lorna Dibble, PharmD Clinical Pharmacist  01/22/2020 4:48 PM

## 2020-01-22 NOTE — ED Triage Notes (Signed)
Pt arrived via [facility] from home with SOB for a couple days. Pt not able to stand without SOB. Hx of COPD. PT recieved a albuterol nebulizer with ems. Chronic 3L BNC at home with sats in the 90s. O2 68% with ems on 3L. Pt placed on 4L with 97% sats with ems.

## 2020-01-22 NOTE — ED Notes (Signed)
Lab requesting recollect lactic acid tube.

## 2020-01-22 NOTE — H&P (Signed)
History and Physical    PLEASE NOTE THAT DRAGON DICTATION SOFTWARE WAS USED IN THE CONSTRUCTION OF THIS NOTE.   Misty Green EYC:[id number] DOB: 05/29/1970 DOA: 01/22/2020  PCP: [name], MD Patient coming from: home   I have personally briefly reviewed patient's old medical records in [place]  Chief Complaint: Shortness of breath  HPI: Misty Green is a 50 y.o. female with medical history significant for chronic hypoxic hypercapnic respiratory failure on 3.5 L continuous nasal cannula, severe COPD, chronic diastolic heart failure, chronic tobacco abuse, generalized anxiety disorder, who is admitted to [facility] on 01/22/2020 with acute on chronic hypoxic hypercapnic respiratory failure in the setting of acute COPD exacerbation acute on chronic diastolic heart failure after presenting from home to [facility] Emergency Department complaining of shortness of breath.   The patient reports 2- 3 days of progressive shortness of breath associated with mild nonproductive cough.  She also notes associated orthopnea without significant increase in edema bilateral lower extremities.  Denies any associated subjective fever, chills, rigors, or generalized myalgias.  Reports chronic rhinitis in the setting of allergic rhinitis, but denies any recent sore throat, wheezing, nausea, vomiting, abdominal pain, diarrhea, or rash.  Denies any recent headache or neck stiffness.  No recent traveling or known COVID-19 exposures.  Denies any recent calf tenderness or lower extremity erythema.  She also denies any recent chest pain, diaphoresis, palpitations.  No recent hemoptysis or pleuritic chest discomfort.  Confirms her baseline supplemental oxygen requirement of continuous 3.5 L nasal cannula, and reports good compliance with her home respiratory regimen which consists of Trelegy Ellipta, Daliresp, Singulair, and as needed albuterol inhaler.  She acknowledges that she  continues to smoke, reporting that she has been able to decrease her daily volume of smoking slightly down to approximately a quarter pack per day.   Medical history is also notable for history of chronic diastolic heart failure, with most recent echocardiogram appearing to have occurred in March 2021, and showed normal left ventricular size, normal left ventricular wall thickness, LVEF 55 to 60%, and no evidence of focal wall motion abnormalities, also not discussing diastolic function.  The patient reports good compliance with her home diuretic regimen which consists of Lasix 20 mg p.o. twice daily.   She notes increased frequency of use of her as needed albuterol inhaler over the last day, and in the setting of further progression of her shortness of breath in spite of good compliance with her home regimen and increased use of rescue inhaler, she elected to present to [facility] ED today for further evaluation of her worsening shortness of breath.     ED Course:  Vital signs in the ED were notable for the following: Temperature max 97.7, heart rate 76-85; blood pressure 158/77, respiratory rate 20-25; initial oxygen saturation on her baseline continuous 2.5 L nasal cannula was in the mid 60s, with ensuing improvement into the mid 90s on 4 to 5 L nasal cannula, before supplemental oxygen delivery was transitioned to BiPAP for enhanced patient comfort as well as management of acute on chronic hypercapnia.   Labs were notable for the following: Presenting VBG on her baseline continuous 3.5 L nasal cannula showed the following: 7.28/greater than 120.  CMP was notable for the following: Sodium 141, potassium 4.4, bicarbonate 41, creatinine 0.52.  High-sensitivity troponin high x1 found to be 8.  CBC notable for white blood cell count of 8100, hemoglobin 13.6.  EKG showed sinus arrhythmia with ventricular  rate 76, nonspecific T wave inversion in V1, and no evidence of ST changes, including no evidence of ST  elevation.  Chest x-ray showed increase in central pulmonary vascular congestion as well as interlobular septal thickening with reticular opacities consistent with acute congestive heart failure, while showing no evidence of pneumothorax or large pleural effusion.  Nasopharyngeal COVID-19/influenza PCR performed in the ED today were found to be negative.  While in the ED, the following were administered: Lasix 40 mg IV x1, duo nebulizer treatment x1, Solu-Medrol 125 mg IV x1, and magnesium sulfate 2 g IV over 2 hours x 1 dose.     Review of Systems: As per HPI otherwise 10 point review of systems negative.   Past Medical History:  Diagnosis Date  . Cardiac arrest ([place]) 05/12/2018  . CHF (congestive heart failure) ([place])   . COPD (chronic obstructive pulmonary disease) ([place])   . Current smoker   . Dyspnea   . GERD (gastroesophageal reflux disease)   . Opiate abuse, continuous ([place])     Past Surgical History:  Procedure Laterality Date  . ABDOMINAL HYSTERECTOMY    . BACK SURGERY    . cesction    . COLOSTOMY  01/13/2018   Procedure: COLOSTOMY Creation;  Surgeon: Jules Husbands, MD;  Location: [facility] ORS;  Service: General;;  . INCISIONAL HERNIA REPAIR     lower midline laparotomy incision (hysterectomy), repaired with mesh  . IR FLUORO GUIDE CV LINE RIGHT  05/26/2018  . IR REMOVAL TUN CV CATH W/O FL  06/02/2018  . IR US GUIDE VASC ACCESS RIGHT  05/26/2018  . LAPAROSCOPIC CHOLECYSTECTOMY  2012  . LAPAROSCOPIC LYSIS OF ADHESIONS  01/13/2018   Procedure: LAPAROSCOPIC LYSIS OF ADHESIONS;  Surgeon: Jules Husbands, MD;  Location: [facility] ORS;  Service: General;;  . LAPAROSCOPIC SIGMOID COLECTOMY  01/13/2018   Procedure: LAPAROSCOPIC SIGMOID COLECTOMY;  Surgeon: Jules Husbands, MD;  Location: [facility] ORS;  Service: General;;  . ORIF WRIST FRACTURE Right 08/25/2015   Procedure: OPEN REDUCTION INTERNAL FIXATION (ORIF) WRIST FRACTURE;  Surgeon: Corky Mull, MD;  Location: [facility] ORS;  Service: Orthopedics;   Laterality: Right;    Social History:  reports that she has been smoking cigarettes. She has smoked for the past 15.00 years. She has never used smokeless tobacco. She reports current alcohol use. She reports current drug use. Drug: Marijuana.   Allergies  Allergen Reactions  . Other Nausea And Vomiting    Anti-depressent that starts with t    Family History  Problem Relation Age of Onset  . COPD Mother   . Hypertension Father   . Diabetes Father      Prior to Admission medications   Medication Sig Start Date End Date Taking? Authorizing Provider  acetaminophen (TYLENOL) 325 MG tablet Take 1-2 tablets (325-650 mg total) by mouth every 4 (four) hours as needed for mild pain. Patient taking differently: Take 650 mg by mouth 3 (three) times daily.  06/05/18   Love, Ivan Anchors, PA-C  albuterol (PROVENTIL HFA;VENTOLIN HFA) 108 (90 Base) MCG/ACT inhaler Inhale 2 puffs into the lungs every 6 (six) hours as needed for wheezing or shortness of breath.     [provider]  albuterol (PROVENTIL) (2.5 MG/3ML) 0.083% nebulizer solution Take 2.5 mg by nebulization every 6 (six) hours as needed for wheezing or shortness of breath. Inhale one vial via nebulizer 4 times a day as directed.     [provider]  ALPRAZolam Duanne Moron) 0.25 MG tablet Take  1 tablet (0.25 mg total) by mouth at bedtime as needed for anxiety. 07/12/19   Mercy Riding, MD  amLODipine (NORVASC) 10 MG tablet TAKE 1 TABLET BY MOUTH EVERY DAY 08/27/19   Lavera Guise, MD  DALIRESP 500 MCG TABS tablet TAKE 1 TABLET BY MOUTH DAILY 08/27/19   Lavera Guise, MD  escitalopram (LEXAPRO) 20 MG tablet TAKE 1 TABLET BY MOUTH EVERY DAY 07/20/19   Ronnell Freshwater, NP  Fluticasone-Umeclidin-Vilant (TRELEGY ELLIPTA) 100-62.5-25 MCG/INH AEPB Inhale 1 each into the lungs daily. 07/19/19   Kendell Bane, NP  furosemide (LASIX) 20 MG tablet Take 1 tablet (20 mg total) by mouth daily. Patient taking differently: Take 20 mg by mouth 2 (two)  times daily.  05/04/19   Ronnell Freshwater, NP  gabapentin (NEURONTIN) 400 MG capsule Take 1 capsule (400 mg total) by mouth 3 (three) times daily. 07/12/19   Mercy Riding, MD  Melatonin 10 MG TABS Take 30 mg by mouth at bedtime.    [provider]  montelukast (SINGULAIR) 10 MG tablet TAKE 1 TABLET BY MOUTH EVERYDAY AT BEDTIME 07/30/19   Kendell Bane, NP  Multiple Vitamins-Minerals (MULTIVITAMIN WITH MINERALS) tablet Take 1 tablet by mouth at bedtime.     [provider]  polyethylene glycol (MIRALAX / GLYCOLAX) 17 g packet Take 17 g by mouth 2 (two) times daily. Patient taking differently: Take 17 g by mouth daily. Mix in coffee and drink 06/05/18   Love, Ivan Anchors, PA-C  predniSONE (DELTASONE) 20 MG tablet Take 2 tablets (40 mg total) by mouth daily with breakfast. 07/12/19   Mercy Riding, MD  PRESCRIPTION MEDICATION Inhale into the lungs at bedtime. BiPap    [provider]  QUEtiapine (SEROQUEL) 50 MG tablet TAKE 1 TABLET BY MOUTH EVERYDAY AT BEDTIME 08/27/19   Lavera Guise, MD     Objective    Physical Exam: Vitals:   01/22/20 1347 01/22/20 1400 01/22/20 1418 01/22/20 1430  BP:  (!) 145/115  (!) 134/111  Pulse:  76  85  Resp:  18  (!) 25  Temp:      TempSrc:      SpO2:  99% 91% 97%  Weight: 97.5 kg     Height: 5\' 4"  (1.626 m)       General: appears to be stated age; alert, oriented; slight increased work of breathing noted. Skin: warm, dry, no rash Head:  AT/[place] Mouth:  Oral mucosa membranes appear moist, normal dentition Neck: supple; trachea midline Heart:  RRR; did not appreciate any M/R/G Lungs: CTAB, did not appreciate any wheezes, rales, or rhonchi Abdomen: + BS; soft, ND, NT Vascular: 2+ pedal pulses b/l; 2+ radial pulses b/l Extremities: no peripheral edema, no muscle wasting Neuro: strength and sensation intact in upper and lower extremities b/l   Labs on Admission: I have personally reviewed following labs and imaging  studies  CBC: Recent Labs  Lab 01/22/20 1340  WBC 8.1  NEUTROABS 6.3  HGB 13.6  HCT 45.6  MCV 101.8*  PLT 332   Basic Metabolic Panel: Recent Labs  Lab 01/22/20 1340  NA 141  K 4.4  CL 86*  CO2 41*  GLUCOSE 140*  BUN 15  CREATININE 0.52  CALCIUM 9.6   GFR: Estimated Creatinine Clearance: 95.4 mL/min (by C-G formula based on SCr of 0.52 mg/dL). Liver Function Tests: Recent Labs  Lab 01/22/20 1340  AST 32  ALT 18  ALKPHOS 48  BILITOT 0.3  PROT <3.0*  ALBUMIN <1.0*   No results for input(s): LIPASE, AMYLASE in the last 168 hours. No results for input(s): AMMONIA in the last 168 hours. Coagulation Profile: No results for input(s): INR, PROTIME in the last 168 hours. Cardiac Enzymes: No results for input(s): CKTOTAL, CKMB, CKMBINDEX, TROPONINI in the last 168 hours. BNP (last 3 results) No results for input(s): PROBNP in the last 8760 hours. HbA1C: No results for input(s): HGBA1C in the last 72 hours. CBG: No results for input(s): GLUCAP in the last 168 hours. Lipid Profile: No results for input(s): CHOL, HDL, LDLCALC, TRIG, CHOLHDL, LDLDIRECT in the last 72 hours. Thyroid Function Tests: No results for input(s): TSH, T4TOTAL, FREET4, T3FREE, THYROIDAB in the last 72 hours. Anemia Panel: No results for input(s): VITAMINB12, FOLATE, FERRITIN, TIBC, IRON, RETICCTPCT in the last 72 hours. Urine analysis:    Component Value Date/Time   COLORURINE STRAW (A) 07/09/2019 0337   APPEARANCEUR CLEAR 07/09/2019 0337   LABSPEC 1.005 07/09/2019 0337   PHURINE 5.0 07/09/2019 0337   GLUCOSEU NEGATIVE 07/09/2019 0337   HGBUR SMALL (A) 07/09/2019 0337   BILIRUBINUR NEGATIVE 07/09/2019 0337   KETONESUR NEGATIVE 07/09/2019 0337   PROTEINUR 100 (A) 07/09/2019 0337   NITRITE NEGATIVE 07/09/2019 0337   LEUKOCYTESUR NEGATIVE 07/09/2019 0337    Radiological Exams on Admission: DG Chest Portable 1 View  Result Date: 01/22/2020 CLINICAL DATA:  50 year old female with  shortness of breath EXAM: PORTABLE CHEST 1 VIEW COMPARISON:  07/08/2010 FINDINGS: Cardiomediastinal silhouette unchanged in size and contour. Fullness in the central vasculature. Interlobular septal thickening with reticular opacities. No pneumothorax or large pleural effusion. No confluent airspace disease IMPRESSION: Acute CHF Electronically Signed   By: Corrie Mckusick D.O.   On: 01/22/2020 14:59     EKG: Independently reviewed, with result as described above.    Assessment/Plan   LIAN POUNDS is a 50 y.o. female with medical history significant for chronic hypoxic hypercapnic respiratory failure on 3.5 L continuous nasal cannula, severe COPD, chronic diastolic heart failure, chronic tobacco abuse, generalized anxiety disorder, who is admitted to [facility] on 01/22/2020 with acute on chronic hypoxic hypercapnic respiratory failure in the setting of acute COPD exacerbation acute on chronic diastolic heart failure after presenting from home to [facility] Emergency Department complaining of shortness of breath.     Principal Problem:   Acute on chronic respiratory failure with hypoxia and hypercapnia (HCC) Active Problems:   COPD exacerbation (HCC)   Anxiety   Tobacco user   Chronic obstructive pulmonary disease (HCC)   Acute on chronic diastolic CHF (congestive heart failure) ([place])    #) Acute on chronic hypoxic hypercapnic respiratory failure: In the context of severe COPD as well as chronic diastolic heart failure, the patient has a baseline supplemental oxygen requirement of continuous 3.5 L nasal cannula, and presents this evening to 3 days progressive shortness of breath as well as evidence of acute on chronic hypoxia, as further quantified above.  Appears to be multifactorial in nature, with suspected initial contribution from acute COPD exacerbation, with ensuing development of acute on chronic diastolic heart failure, with suspected decompensation felt to be on the  basis of stress due to initial copd exacerbation.  Source of exacerbation of the patient's severe COPD is completely clear at this time, and she reports good compliance with her outpatient respiratory regimen, while there is no evidence of pneumonia and COVID-19/influenza PCR were found to be negative today.  Currently on BiPAP for treatment of  acute on chronic hypercapnic element as well as for patient comfort in the setting of concomitant acutely decompensated heart failure.  Will repeat ABG later this evening to guide adjustments to current BiPAP settings.    Plan: DuoNeb scheduled every 6 hours while awake.  As needed albuterol nebulizer.  Site Medrol 40 mg IV every 6 hours.  Monitor on telemetry.  Monitor continuous pulse oximetry.  Check serum phosphorus level.  will also repeat serum magnesium level.  Continue home Daliresp and Singulair. Will attempt additional chart review for evaluation of most recent pulmonary function testing results.  Work-up and management of acute on chronic diastolic heart failure, including approach to diuresis, as further described below.  Check ABG, as above.       #) Acute on chronic diastolic heart failure: In the context of a documented history of chronic diastolic heart failure, with most recent echocardiogram in March 2021, with results as further described above, the patient presents today with 2 to 3 days of progressive shortness of breath associated with orthopnea as well as evidence of acutely decompensated heart failure on chest x-ray, as further described above.  Suspect contribution from stress relating to preceding acute COPD exacerbation, as above.  Patient reports good compliance with her home diuretic regimen consisting of Lasix 20 mg p.o. twice daily.  Presentation not associated any chest pain, and ACS is felt to be less likely in the absence of any acute ischemic changes on presenting EKG, while high-sensitivity troponin I was found to be nonelevated,  and even after 48 to 72 hours of ongoing shortness of breath.  She received Lasix 40 mg IV x1 in the ED this evening.  Lasix 4 mg IV twice daily.   Plan: monitor strict I's & O's and daily weights. Monitor on telemetry, including trend in HR in response to diuresis, as above. Monitor continuous pulse oximetry. Repeat BMP in the morning, including for monitoring of trend of potassium, bicarbonate, and renal function in response to interval diuresis efforts. Check serum magnesium level. Will reassess volume status in the morning and clinically correlate to help guide additional diuresis efforts. Close monitoring of ensuing blood pressure with diuresis efforts, particular in the setting of current BiPAP use, with associated monitoring for development of hypotension, as well as to assess adequacy of afterload reduction.  Consider repeat echocardiogram once more euvolemic as it is almost been 1 year since her most recent prior with interval acute exacerbation.       #) Chronic tobacco abuse: The patient knowledges that she is a current smoker, having smoked approximately half pack per day over the last 15 years, but acknowledges slight decline in her daily smoking habits down to approximately 1/4 pack/day.  Plan: Counseled the patient on the importance of complete discontinuation of smoking, particular given her history of severe COPD as well as chronic heart failure.  As needed nicotine patch has been ordered for use during this hospitalization.      #) Generalized anxiety disorder: On Lexapro as well as as needed Xanax at home.  Plan: Continue home Lexapro.  Will hold home as needed Xanax for now.  Instead, ordered as needed IV Ativan for now.     DVT prophylaxis: Lovenox for milligrams subcu daily Code Status: Full code Family Communication: none Disposition Plan: Per Rounding Team Consults called: none  Admission status: Inpatient; stepdown unit (in setting of current bipap use)    Of  note, this patient was added by me to the following Admit  List/Treatment Team:  armcadmits      PLEASE NOTE THAT DRAGON DICTATION SOFTWARE WAS USED IN THE CONSTRUCTION OF THIS NOTE.   [facility] Pager [phone] From 12PM- 12AM  Otherwise, please contact night-coverage  [email or web address] Password [email or web address]  01/22/2020, 3:24 PM

## 2020-01-22 NOTE — ED Provider Notes (Signed)
[facility] Emergency Department Provider Note  ____________________________________________   Event Date/Time   First MD Initiated Contact with Patient 01/22/20 1351     (approximate)  I have reviewed the triage vital signs and the nursing notes.   HISTORY  Chief Complaint Shortness of Breath    HPI Misty Green is a 50 y.o. female  With h/o COPD, CHF, here with SOB. Pt reports sx started as mild dry cough and SOB 3 days ago. This was followed by significant worsening wheezing, increased WOB, and dyspnea. Dyspnea is now at rest, worse w/ any exertion. She has no chest pain. She has had subjective fevers. She has been using her home albuterol repeatedly without significant relief. No known COVID exposures, and she is not vaccinated. No recent med changes. No increased LE swelling.        Past Medical History:  Diagnosis Date  . Cardiac arrest ([place]) 05/12/2018  . CHF (congestive heart failure) ([place])   . COPD (chronic obstructive pulmonary disease) ([place])   . Current smoker   . Dyspnea   . GERD (gastroesophageal reflux disease)   . Opiate abuse, continuous [facility])     Patient Active Problem List   Diagnosis Date Noted  . Acute on chronic respiratory failure with hypoxia and hypercapnia ([place]) 01/22/2020  . Acute on chronic diastolic CHF (congestive heart failure) ([place]) 01/22/2020  . Respiratory failure with hypercapnia ([place]) 07/08/2019  . Chronic fatigue 05/16/2019  . SOB (shortness of breath) 05/16/2019  . Seasonal allergic reaction 05/16/2019  . Anxiety disorder due to multiple medical problems 05/16/2019  . Edema 05/16/2019  . Encounter for screening mammogram for malignant neoplasm of breast 05/16/2019  . OSA (obstructive sleep apnea)   . Noncompliance with CPAP treatment   . Chronic obstructive pulmonary disease ([place])   . Chest wall pain   . Essential hypertension   . Steroid-induced hyperglycemia   . Anemia of chronic disease   . AKI  (acute kidney injury) ([place])   . Supplemental oxygen dependent   . Generalized anxiety disorder   . Hypoxic encephalopathy ([place]) 05/27/2018  . Anoxic brain injury ([place])   . ARF (acute renal failure) ([place])   . Lactic acidosis   . Transaminitis   . Acute on chronic respiratory failure with hypercapnia ([place])   . Cardiac arrest ([place]) 05/12/2018  . Diverticulitis of colon (without mention of hemorrhage)(562.11)   . Acute diverticulitis 01/06/2018  . Mixed anxiety and depressive disorder 12/22/2017  . Obesity 12/22/2017  . Tobacco user 12/22/2017  . Diverticulitis 12/03/2017  . Spinal stenosis of lumbar region 04/12/2016  . Acute respiratory failure ([place]) 03/22/2016  . COPD exacerbation ([place]) 03/22/2016  . Chronic back pain 03/22/2016  . Opioid type dependence, abuse ([place]) 03/22/2016  . Polycythemia 03/22/2016  . Anxiety 03/22/2016  . OSA on CPAP 03/22/2016  . Closed fracture of distal end of right radius 08/25/2015  . Closed nondisplaced fracture of styloid process of right ulna 08/25/2015    Past Surgical History:  Procedure Laterality Date  . ABDOMINAL HYSTERECTOMY    . BACK SURGERY    . cesction    . COLOSTOMY  01/13/2018   Procedure: COLOSTOMY Creation;  Surgeon: Jules Husbands, MD;  Location: [facility] ORS;  Service: General;;  . INCISIONAL HERNIA REPAIR     lower midline laparotomy incision (hysterectomy), repaired with mesh  . IR FLUORO GUIDE CV LINE RIGHT  05/26/2018  . IR REMOVAL TUN CV CATH W/O FL  06/02/2018  .  IR US GUIDE VASC ACCESS RIGHT  05/26/2018  . LAPAROSCOPIC CHOLECYSTECTOMY  2012  . LAPAROSCOPIC LYSIS OF ADHESIONS  01/13/2018   Procedure: LAPAROSCOPIC LYSIS OF ADHESIONS;  Surgeon: Jules Husbands, MD;  Location: [facility] ORS;  Service: General;;  . LAPAROSCOPIC SIGMOID COLECTOMY  01/13/2018   Procedure: LAPAROSCOPIC SIGMOID COLECTOMY;  Surgeon: Jules Husbands, MD;  Location: [facility] ORS;  Service: General;;  . ORIF WRIST FRACTURE Right 08/25/2015   Procedure: OPEN REDUCTION  INTERNAL FIXATION (ORIF) WRIST FRACTURE;  Surgeon: Corky Mull, MD;  Location: [facility] ORS;  Service: Orthopedics;  Laterality: Right;    Prior to Admission medications   Medication Sig Start Date End Date Taking? Authorizing Provider  DALIRESP 500 MCG TABS tablet TAKE 1 TABLET BY MOUTH DAILY Patient taking differently: Take 500 mcg by mouth daily. 08/27/19  Yes Lavera Guise, MD  escitalopram (LEXAPRO) 20 MG tablet TAKE 1 TABLET BY MOUTH EVERY DAY Patient taking differently: Take 20 mg by mouth daily. 07/20/19  Yes Boscia, Greer Ee, NP  Fluticasone-Umeclidin-Vilant (TRELEGY ELLIPTA) 100-62.5-25 MCG/INH AEPB Inhale 1 each into the lungs daily. 07/19/19  Yes Scarboro, Audie Clear, NP  furosemide (LASIX) 40 MG tablet Take 40 mg by mouth daily. 01/20/20  Yes [provider]  gabapentin (NEURONTIN) 400 MG capsule Take 1 capsule (400 mg total) by mouth 3 (three) times daily. 07/12/19  Yes Mercy Riding, MD  Melatonin 10 MG TABS Take 30 mg by mouth at bedtime.   Yes [provider]  montelukast (SINGULAIR) 10 MG tablet TAKE 1 TABLET BY MOUTH EVERYDAY AT BEDTIME Patient taking differently: Take 10 mg by mouth at bedtime. 07/30/19  Yes Scarboro, Audie Clear, NP  Multiple Vitamins-Minerals (MULTIVITAMIN WITH MINERALS) tablet Take 1 tablet by mouth at bedtime.    Yes [provider]  pantoprazole (PROTONIX) 40 MG tablet Take 40 mg by mouth 2 (two) times daily. 11/11/19  Yes [provider]  QUEtiapine (SEROQUEL) 50 MG tablet TAKE 1 TABLET BY MOUTH EVERYDAY AT BEDTIME Patient taking differently: Take 50 mg by mouth at bedtime. 08/27/19  Yes Lavera Guise, MD  acetaminophen (TYLENOL) 325 MG tablet Take 1-2 tablets (325-650 mg total) by mouth every 4 (four) hours as needed for mild pain. Patient taking differently: Take 650 mg by mouth 3 (three) times daily. 06/05/18   Love, Ivan Anchors, PA-C  albuterol (PROVENTIL HFA;VENTOLIN HFA) 108 (90 Base) MCG/ACT inhaler Inhale 2 puffs into the lungs every 6  (six) hours as needed for wheezing or shortness of breath.     [provider]  albuterol (PROVENTIL) (2.5 MG/3ML) 0.083% nebulizer solution Take 2.5 mg by nebulization every 6 (six) hours as needed for wheezing or shortness of breath. Inhale one vial via nebulizer 4 times a day as directed.    [provider]  amLODipine (NORVASC) 10 MG tablet TAKE 1 TABLET BY MOUTH EVERY DAY Patient not taking: No sig reported 08/27/19   Lavera Guise, MD  OZEMPIC, 0.25 OR 0.5 MG/DOSE, 2 MG/1.5ML SOPN Inject 0.25 mg into the skin once a week. 10/02/19   [provider]  PRESCRIPTION MEDICATION Inhale into the lungs at bedtime. BiPap    [provider]  Vitamin D, Ergocalciferol, (DRISDOL) 1.25 MG (50000 UNIT) CAPS capsule Take 50,000 Units by mouth once a week. 11/13/19   [provider]    Allergies Other  Family History  Problem Relation Age of Onset  . COPD Mother   . Hypertension Father   .  Diabetes Father     Social History Social History   Tobacco Use  . Smoking status: Current Some Day Smoker    Years: 15.00    Types: Cigarettes  . Smokeless tobacco: Never Used  . Tobacco comment: 4 cigarettes a day   Vaping Use  . Vaping Use: Former  Substance Use Topics  . Alcohol use: Yes    Comment: ocassionally   . Drug use: Yes    Types: Marijuana    Comment: not everyday     Review of Systems  Review of Systems  Constitutional: Positive for fatigue. Negative for fever.  HENT: Negative for congestion and sore throat.   Eyes: Negative for visual disturbance.  Respiratory: Positive for cough, shortness of breath and wheezing.   Cardiovascular: Negative for chest pain.  Gastrointestinal: Positive for nausea. Negative for abdominal pain, diarrhea and vomiting.  Genitourinary: Negative for flank pain.  Musculoskeletal: Negative for back pain and neck pain.  Skin: Negative for rash and wound.  Neurological: Positive for weakness.  All other systems  reviewed and are negative.    ____________________________________________  PHYSICAL EXAM:      VITAL SIGNS: ED Triage Vitals  Enc Vitals Group     BP 01/22/20 1338 (!) 158/77     Pulse Rate 01/22/20 1338 76     Resp 01/22/20 1338 20     Temp 01/22/20 1338 97.7 F (36.5 C)     Temp Source 01/22/20 1338 Oral     SpO2 01/22/20 1338 100 %     Weight 01/22/20 1347 215 lb (97.5 kg)     Height 01/22/20 1347 5\' 4"  (1.626 m)     Head Circumference --      Peak Flow --      Pain Score 01/22/20 1345 8     Pain Loc --      Pain Edu? --      Excl. in [place]? --      Physical Exam Vitals and nursing note reviewed.  Constitutional:      General: She is not in acute distress.    Appearance: She is well-developed.  HENT:     Head: Normocephalic and atraumatic.  Eyes:     Conjunctiva/sclera: Conjunctivae normal.  Neck:     Vascular: JVD present.  Cardiovascular:     Rate and Rhythm: Regular rhythm. Tachycardia present.     Heart sounds: Normal heart sounds. No murmur heard. No friction rub.  Pulmonary:     Effort: Pulmonary effort is normal. Tachypnea present. No respiratory distress.     Breath sounds: Examination of the right-middle field reveals rales. Examination of the left-middle field reveals rales. Examination of the right-lower field reveals rales. Examination of the left-lower field reveals rales. Decreased breath sounds and rales present. No wheezing.  Abdominal:     General: There is no distension.     Palpations: Abdomen is soft.     Tenderness: There is no abdominal tenderness.  Musculoskeletal:     Cervical back: Neck supple.  Skin:    General: Skin is warm.     Capillary Refill: Capillary refill takes less than 2 seconds.  Neurological:     Mental Status: She is alert and oriented to person, place, and time.     Motor: No abnormal muscle tone.       ____________________________________________   LABS (all labs ordered are listed, but only abnormal results  are displayed)  Labs Reviewed  CBC WITH DIFFERENTIAL/PLATELET - Abnormal; Notable for  the following components:      Result Value   MCV 101.8 (*)    MCHC 29.8 (*)    Abs Immature Granulocytes 0.09 (*)    All other components within normal limits  COMPREHENSIVE METABOLIC PANEL - Abnormal; Notable for the following components:   Chloride 86 (*)    CO2 41 (*)    Glucose, Bld 140 (*)    Total Protein <3.0 (*)    Albumin <1.0 (*)    All other components within normal limits  LACTIC ACID, PLASMA - Abnormal; Notable for the following components:   Lactic Acid, Venous 2.1 (*)    All other components within normal limits  BLOOD GAS, VENOUS - Abnormal; Notable for the following components:   pCO2, Ven >120.0 (*)    pO2, Ven 78.0 (*)    Bicarbonate 58.7 (*)    Acid-Base Excess 24.9 (*)    All other components within normal limits  RESP PANEL BY RT-PCR (FLU A&B, COVID) ARPGX2  BRAIN NATRIURETIC PEPTIDE  BLOOD GAS, ARTERIAL  CALCIUM, IONIZED  HIV ANTIBODY (ROUTINE TESTING W REFLEX)  PHOSPHORUS  BASIC METABOLIC PANEL  MAGNESIUM  CBC  TROPONIN I (HIGH SENSITIVITY)  TROPONIN I (HIGH SENSITIVITY)    ____________________________________________  EKG: Sinus arrhythmia, VR 76. QRS 87, QTc 455. No acute St elevations or depressions. No ischemia or infarct. ________________________________________  RADIOLOGY All imaging, including plain films, CT scans, and ultrasounds, independently reviewed by me, and interpretations confirmed via formal radiology reads.  ED MD interpretation:   CXR: CXR findings concerning for atypical PNA vs CHF, no focal findings  Official radiology report(s): DG Chest Portable 1 View  Result Date: 01/22/2020 CLINICAL DATA:  50 year old female with shortness of breath EXAM: PORTABLE CHEST 1 VIEW COMPARISON:  07/08/2010 FINDINGS: Cardiomediastinal silhouette unchanged in size and contour. Fullness in the central vasculature. Interlobular septal thickening with  reticular opacities. No pneumothorax or large pleural effusion. No confluent airspace disease IMPRESSION: Acute CHF Electronically Signed   By: Corrie Mckusick D.O.   On: 01/22/2020 14:59    ____________________________________________  PROCEDURES   Procedure(s) performed (including Critical Care):  .Critical Care Performed by: Duffy Bruce, MD Authorized by: Duffy Bruce, MD   Critical care provider statement:    Critical care time (minutes):  35   Critical care time was exclusive of:  Separately billable procedures and treating other patients and teaching time   Critical care was necessary to treat or prevent imminent or life-threatening deterioration of the following conditions:  Cardiac failure, circulatory failure and respiratory failure   Critical care was time spent personally by me on the following activities:  Development of treatment plan with patient or surrogate, discussions with consultants, evaluation of patient's response to treatment, examination of patient, obtaining history from patient or surrogate, ordering and performing treatments and interventions, ordering and review of laboratory studies, ordering and review of radiographic studies, pulse oximetry, re-evaluation of patient's condition and review of old charts   I assumed direction of critical care for this patient from another provider in my specialty: no   .1-3 Lead EKG Interpretation Performed by: Duffy Bruce, MD Authorized by: Duffy Bruce, MD     Interpretation: normal     ECG rate:  80-100   ECG rate assessment: normal     Rhythm: sinus rhythm     Ectopy: none     Conduction: normal   Comments:     Indication: sob    ____________________________________________  INITIAL IMPRESSION / MDM / ASSESSMENT  AND PLAN / ED COURSE  As part of my medical decision making, I reviewed the following data within the [place] notes reviewed and incorporated, Old chart reviewed,  Notes from prior ED visits, and [place] [employer]rolled Substance [place] R Britz was evaluated in Emergency Department on 01/22/2020 for the symptoms described in the history of present illness. She was evaluated in the context of the global COVID-19 pandemic, which necessitated consideration that the patient might be at risk for infection with the SARS-CoV-2 virus that causes COVID-19. Institutional protocols and algorithms that pertain to the evaluation of patients at risk for COVID-19 are in a state of rapid change based on information released by regulatory bodies including the CDC and federal and state organizations. These policies and algorithms were followed during the patient's care in the ED.  Some ED evaluations and interventions may be delayed as a result of limited staffing during the pandemic.*     Medical Decision Making:  50 yo F here with cough, SOB, wheezing. On arrival, pt tachycardic, tachypneic, in mod distress but mentating well. Requiring 3L, above her baseline 2L. CXR interestingly is more concerning for CHF, though no LE edema. No fever. COVID negative. Labs reviewed, show overall normal trop, BNP which is also less c/w CHF, though I suspect her resp failure is multifactorial. VBG with acute on chronic resp acidosis with hypercapnea. Pt significantly improving clinically with BIPAP. Nebs, steroids given for COPD component. Lasix given for CHF. Will admit to step down for acute hypoxic and hypercapneic resp failure, likely multifactorial 2/2 COPD and CHF.   ____________________________________________  FINAL CLINICAL IMPRESSION(S) / ED DIAGNOSES  Final diagnoses:  COPD exacerbation ([place])  Shortness of breath  Acute systolic congestive heart failure ([facility])  Acute respiratory failure with hypoxia ([facility])     MEDICATIONS GIVEN DURING THIS VISIT:  Medications  amLODipine (NORVASC) tablet 10 mg (has no administration in time range)  escitalopram (LEXAPRO) tablet 20 mg  (has no administration in time range)  QUEtiapine (SEROQUEL) tablet 50 mg (has no administration in time range)  multivitamin with minerals tablet 1 tablet (has no administration in time range)  roflumilast (DALIRESP) tablet 500 mcg (has no administration in time range)  montelukast (SINGULAIR) tablet 10 mg (has no administration in time range)  enoxaparin (LOVENOX) injection 50 mg (has no administration in time range)  acetaminophen (TYLENOL) tablet 650 mg (has no administration in time range)    Or  acetaminophen (TYLENOL) suppository 650 mg (has no administration in time range)  ipratropium-albuterol (DUONEB) 0.5-2.5 (3) MG/3ML nebulizer solution 3 mL (has no administration in time range)  albuterol (PROVENTIL) (2.5 MG/3ML) 0.083% nebulizer solution 2.5 mg (has no administration in time range)  methylPREDNISolone sodium succinate (SOLU-MEDROL) 40 mg/mL injection 40 mg (has no administration in time range)  furosemide (LASIX) injection 40 mg (has no administration in time range)  fluticasone (FLONASE) 50 MCG/ACT nasal spray 1 spray (has no administration in time range)  LORazepam (ATIVAN) injection 0.5 mg (has no administration in time range)  melatonin tablet 5 mg (has no administration in time range)  nicotine (NICODERM CQ - dosed in mg/24 hours) patch 14 mg (has no administration in time range)  ipratropium-albuterol (DUONEB) 0.5-2.5 (3) MG/3ML nebulizer solution 3 mL (3 mLs Nebulization Given 01/22/20 1444)  ipratropium-albuterol (DUONEB) 0.5-2.5 (3) MG/3ML nebulizer solution 3 mL (3 mLs Nebulization Given 01/22/20 1444)  ipratropium-albuterol (DUONEB) 0.5-2.5 (3) MG/3ML nebulizer solution 3 mL (3 mLs Nebulization  Given 01/22/20 1422)  methylPREDNISolone sodium succinate (SOLU-MEDROL) 125 mg/2 mL injection 125 mg (125 mg Intravenous Given 01/22/20 1434)  acetaminophen (TYLENOL) tablet 1,000 mg (1,000 mg Oral Given 01/22/20 1421)  magnesium sulfate IVPB 2 g 50 mL (0 g Intravenous Stopped 01/22/20  1458)  ketorolac (TORADOL) 30 MG/ML injection 15 mg (15 mg Intravenous Given 01/22/20 1436)  furosemide (LASIX) injection 40 mg (40 mg Intravenous Given 01/22/20 1513)     ED Discharge Orders    None       Note:  This document was prepared using Dragon voice recognition software and may include unintentional dictation errors.   Duffy Bruce, MD 01/22/20 2017

## 2020-01-23 DIAGNOSIS — J9621 Acute and chronic respiratory failure with hypoxia: Secondary | ICD-10-CM | POA: Diagnosis not present

## 2020-01-23 DIAGNOSIS — J9622 Acute and chronic respiratory failure with hypercapnia: Secondary | ICD-10-CM | POA: Diagnosis not present

## 2020-01-23 LAB — CBC
HCT: 44.8 % (ref 36.0–46.0)
Hemoglobin: 13.3 g/dL (ref 12.0–15.0)
MCH: 30.1 pg (ref 26.0–34.0)
MCHC: 29.7 g/dL — ABNORMAL LOW (ref 30.0–36.0)
MCV: 101.4 fL — ABNORMAL HIGH (ref 80.0–100.0)
Platelets: 259 10*3/uL (ref 150–400)
RBC: 4.42 MIL/uL (ref 3.87–5.11)
RDW: 11.7 % (ref 11.5–15.5)
WBC: 6.7 10*3/uL (ref 4.0–10.5)
nRBC: 0 % (ref 0.0–0.2)

## 2020-01-23 LAB — BASIC METABOLIC PANEL
Anion gap: 12 (ref 5–15)
BUN: 17 mg/dL (ref 6–20)
CO2: 49 mmol/L — ABNORMAL HIGH (ref 22–32)
Calcium: 10.2 mg/dL (ref 8.9–10.3)
Chloride: 81 mmol/L — ABNORMAL LOW (ref 98–111)
Creatinine, Ser: 0.47 mg/dL (ref 0.44–1.00)
GFR, Estimated: >60 mL/min (ref >60)
Glucose, Bld: 119 mg/dL — ABNORMAL HIGH (ref 70–99)
Potassium: 3.9 mmol/L (ref 3.5–5.1)
Sodium: 142 mmol/L (ref 135–145)

## 2020-01-23 LAB — BLOOD GAS, ARTERIAL
Acid-Base Excess: 28 mmol/L — ABNORMAL HIGH (ref 0.0–2.0)
Acid-Base Excess: 34 mmol/L — ABNORMAL HIGH (ref 0.0–2.0)
Bicarbonate: 61.2 mmol/L — ABNORMAL HIGH (ref 20.0–28.0)
Bicarbonate: 66.2 mmol/L — ABNORMAL HIGH (ref 20.0–28.0)
Delivery systems: POSITIVE
Delivery systems: POSITIVE
Expiratory PAP: 8
Expiratory PAP: 8
FIO2: 0.5
FIO2: 0.4
Inspiratory PAP: 14
Inspiratory PAP: 14
O2 Saturation: 97.9 %
O2 Saturation: 93.6 %
Patient temperature: 37
Patient temperature: 37
RATE: 14 resp/min
pCO2 arterial: 116 mmHg (ref 32.0–48.0)
pCO2 arterial: 102 mmHg (ref 32.0–48.0)
pH, Arterial: 7.33 — ABNORMAL LOW (ref 7.350–7.450)
pH, Arterial: 7.42 (ref 7.350–7.450)
pO2, Arterial: 109 mmHg — ABNORMAL HIGH (ref 83.0–108.0)
pO2, Arterial: 68 mmHg — ABNORMAL LOW (ref 83.0–108.0)

## 2020-01-23 LAB — MAGNESIUM: Magnesium: 2.6 mg/dL — ABNORMAL HIGH (ref 1.7–2.4)

## 2020-01-23 LAB — PHOSPHORUS: Phosphorus: 3.1 mg/dL (ref 2.5–4.6)

## 2020-01-23 LAB — HIV ANTIBODY (ROUTINE TESTING W REFLEX): HIV Screen 4th Generation wRfx: NONREACTIVE

## 2020-01-23 LAB — FOLATE: Folate: 10.1 ng/mL (ref >5.9)

## 2020-01-23 MED ORDER — GUAIFENESIN-CODEINE 100-10 MG/5ML PO SOLN: 10.0000 mL | Freq: Four times a day (QID) | ORAL | Status: DC | PRN

## 2020-01-23 MED ORDER — SODIUM CHLORIDE 0.9 % IV SOLN: Freq: Once | INTRAVENOUS | Status: AC

## 2020-01-23 MED ORDER — FLUTICASONE FUROATE-VILANTEROL 100-25 MCG/INH IN AEPB
1.0000 | INHALATION_SPRAY | Freq: Every day | RESPIRATORY_TRACT | Status: DC
  Filled 2020-01-23: qty 28

## 2020-01-23 MED ORDER — GUAIFENESIN ER 600 MG PO TB12
600.0000 mg | ORAL_TABLET | Freq: Two times a day (BID) | ORAL | Status: DC
  Administered 2020-01-23: 600 mg via ORAL
  Filled 2020-01-23: qty 1

## 2020-01-23 MED ORDER — HYDROXYZINE HCL 25 MG PO TABS
25.0000 mg | ORAL_TABLET | Freq: Three times a day (TID) | ORAL | Status: DC
  Administered 2020-01-23: 25 mg via ORAL
  Filled 2020-01-23 (×4): qty 1

## 2020-01-23 MED ORDER — LORAZEPAM 2 MG/ML IJ SOLN
1.0000 mg | Freq: Four times a day (QID) | INTRAMUSCULAR | Status: DC | PRN
  Administered 2020-01-24 – 2020-02-05 (×6): 1 mg via INTRAVENOUS
  Filled 2020-01-23 (×7): qty 1

## 2020-01-23 NOTE — ED Notes (Signed)
RT attempted to remove Bipap and place on [place] 3L. Pt SPO2 dropped to 70s. Pt placed back on Bipap. RT discussed with pt.

## 2020-01-23 NOTE — ED Notes (Signed)
Spoke with RT. Discussed pt status and the possibility of downgrading to HFNC. Pt has been confused during this shift, pulling off cardiac leads, purewick etc. from time to time and needing reassurance of what these items are. RT states that due to pt mental status as well as CO2 of 116 on last ABG, it would be preferable for pt to remain on Bipap and remain NPO at this time.

## 2020-01-23 NOTE — Progress Notes (Signed)
Attempted to give pt a break from bipap. Placed pt on 3.5LNC which she wears at home. Pt desat into the 70's on 3.5LNC. Pt placed back on bipap with sats in the low 90's.

## 2020-01-23 NOTE — Progress Notes (Signed)
[facility] Progress Note  Patient: Misty Green    IOX:[id number]  DOA: 01/22/2020     Date of Service: the patient was seen and examined on 01/23/2020  Chief Complaint  Patient presents with  . Shortness of Breath   Brief hospital course: Misty Green is a 50 y.o. female with medical history significant for chronic hypoxic hypercapnic respiratory failure on 3.5 L continuous nasal cannula, severe COPD, chronic diastolic heart failure, chronic tobacco abuse, generalized anxiety disorder, who is admitted to [facility] on 01/22/2020 with acute on chronic hypoxic hypercapnic respiratory failure in the setting of acute COPD exacerbation acute on chronic diastolic heart failure after presenting from home to [facility] Emergency Department complaining of shortness of breath.    Currently further plan is continue breathing treatments and BiPAP as needed, continue IV Solu-Medrol  Assessment and Plan:    Principal Problem:   Acute on chronic respiratory failure with hypoxia and hypercapnia (HCC) Active Problems:   COPD exacerbation (HCC)   Anxiety   Tobacco user   Chronic obstructive pulmonary disease (HCC)   Acute on chronic diastolic CHF (congestive heart failure) (HCC)    #) Acute on chronic hypoxic hypercapnic respiratory failure due to COPD exacerbation And H/o chronic diastolic heart failure,  baseline supplemental oxygen requirement of continuous 3.5 L nasal cannula Sob appears to be multifactorial in nature, with suspected initial contribution from acute COPD exacerbation, with ensuing development of acute on chronic diastolic heart failure, with suspected decompensation felt to be on the basis of stress due to initial copd exacerbation.   no evidence of pneumonia and COVID-19/influenza PCR were found to be negative Currently on BiPAP for treatment of acute on chronic hypercapnic element as well as for patient comfort in the setting of concomitant  acutely decompensated heart failure.    Plan:  DuoNeb scheduled every 6 hours while awake.   As needed albuterol nebulizer.   Solu-Medrol 40 mg IV every 6 hours.   Monitor on telemetry.  Monitor continuous pulse oximetry.   Check serum phosphorus level.  will also repeat serum magnesium level.   Continue home Daliresp and Singulair.  Check ABG    #) Acute on chronic diastolic heart failure: chronic diastolic heart failure, with most recent echocardiogram in March 2021,   2 to 3 days of progressive shortness of breath associated with orthopnea as well as evidence of acutely decompensated heart failure on chest x-ray, as further described above.  Suspect contribution from stress relating to preceding acute COPD exacerbation, as above.  Patient reports good compliance with her home diuretic regimen consisting of Lasix 20 mg p.o. twice daily.  Presentation not associated any chest pain, and ACS is felt to be less likely in the absence of any acute ischemic changes on presenting EKG, while high-sensitivity troponin I was found to be nonelevated, and even after 48 to 72 hours of ongoing shortness of breath.  She received Lasix 40 mg IV x1 in the ED this evening.  Lasix 4 mg IV twice daily.   Plan: monitor strict I's & O's and daily weights.  Monitor on telemetry, including trend in HR in response to diuresis,  Monitor continuous pulse oximetry.  Repeat BMP in the morning, including for monitoring of trend of potassium, bicarbonate, and renal function in response to interval diuresis efforts.  Check serum magnesium level.  Will reassess volume status in the morning and clinically correlate to help guide additional diuresis efforts.  Close monitoring of ensuing blood  pressure with diuresis efforts, particular in the setting of current BiPAP use, with associated monitoring for development of hypotension, as well as to assess adequacy of afterload reduction.   Consider repeat echocardiogram once  more euvolemic as it is almost been 1 year since her most recent prior with interval acute exacerbation.  1/5 Breo Ellipta inhaler    #) Chronic tobacco abuse: The patient knowledges that she is a current smoker, having smoked approximately half pack per day over the last 15 years, but acknowledges slight decline in her daily smoking habits down to approximately 1/4 pack/day.   Plan: Counseled the patient on the importance of complete discontinuation of smoking, particular given her history of severe COPD as well as chronic heart failure.  As needed nicotine patch has been ordered for use during this hospitalization.     #) Generalized anxiety disorder: On Lexapro as well as as needed Xanax at home.  Plan: Continue home Lexapro.  Will hold home as needed Xanax for now.  Instead, ordered as needed IV Ativan for now. Started Atarax   Body mass index is 36.9 kg/m.  Interventions:      Diet: Heart healthy  DVT Prophylaxis: Subcutaneous Lovenox   Advance goals of care discussion: Full code  Family Communication: family was not present at bedside, at the time of interview.  The pt provided permission to discuss medical plan with the family. Opportunity was given to ask question and all questions were answered satisfactorily.   Disposition:  Pt is from Home, admitted with respiratory failure, still has respiratory failure, which precludes a safe discharge. Discharge to home, when respiratory failure improves and she is able to wean off of the BiPAP.  Subjective: No significant events, patient remained on BiPAP, feels a little bit improvement but he still has shortness of breath.  Patient was very anxious and stated that she is feeling hot.  Denies any chest pain or palpitations.  Denies any abdominal pain, nausea, vomiting or diarrhea.   Physical Exam: General:  alert oriented to time, place, and person.  Appear in mild distress, affect anxious Eyes: PERRLA ENT: Oral  Mucosa Clear, moist  Neck: no JVD,  Cardiovascular: S1 and S2 Present, no Murmur,  Respiratory: increased respiratory effort, Bilateral Air entry equal and Decreased, mild  Crackles, Positive wheezes Abdomen: Bowel Sound present, Soft and no tenderness,  Skin: no rashes Extremities: no Pedal edema, no calf tenderness Neurologic: without any new focal findings Gait not checked due to patient safety concerns  Vitals:   01/23/20 1400 01/23/20 1430 01/23/20 1500 01/23/20 1530  BP: 128/87 130/89 (!) 144/103 (!) 154/74  Pulse: 95 100 98 96  Resp: (!) 21 20 (!) 25 20  Temp:      TempSrc:      SpO2: 92% 95% 95% 95%  Weight:      Height:       No intake or output data in the 24 hours ending 01/23/20 1539 Filed Weights   01/22/20 1347  Weight: 97.5 kg    Data Reviewed: I have personally reviewed and interpreted daily labs, tele strips, imagings as discussed above. I reviewed all nursing notes, pharmacy notes, vitals, pertinent old records I have discussed plan of care as described above with RN and patient/family.  CBC: Recent Labs  Lab 01/22/20 1340 01/23/20 0830  WBC 8.1 6.7  NEUTROABS 6.3  --   HGB 13.6 13.3  HCT 45.6 44.8  MCV 101.8* 101.4*  PLT 225 948   Basic Metabolic  Panel: Recent Labs  Lab 01/22/20 1340 01/23/20 0830  NA 141 142  K 4.4 3.9  CL 86* 81*  CO2 41* 49*  GLUCOSE 140* 119*  BUN 15 17  CREATININE 0.52 0.47  CALCIUM 9.6 10.2  MG  --  2.6*  PHOS  --  3.1    Studies: No results found.  Scheduled Meds: . amLODipine  10 mg Oral Daily  . enoxaparin (LOVENOX) injection  0.5 mg/kg Subcutaneous Q24H  . escitalopram  20 mg Oral Daily  . fluticasone  1 spray Each Nare QHS  . fluticasone furoate-vilanterol  1 puff Inhalation Daily  . furosemide  40 mg Intravenous BID  . guaiFENesin  600 mg Oral BID  . ipratropium-albuterol  3 mL Nebulization Q6H  . methylPREDNISolone (SOLU-MEDROL) injection  40 mg Intravenous Q6H  . montelukast  10 mg Oral QHS  .  multivitamin with minerals  1 tablet Oral QHS  . QUEtiapine  50 mg Oral QHS  . roflumilast  500 mcg Oral Daily   Continuous Infusions: PRN Meds: acetaminophen **OR** acetaminophen, albuterol, guaiFENesin-codeine, HYDROcodone-acetaminophen, LORazepam, melatonin, nicotine, ondansetron (ZOFRAN) IV  Time spent: 35 minutes  Author: Val Riles. MD [facility] 01/23/2020 3:39 PM  To reach On-call, see care teams to locate the attending and reach out to them via [email or web address]. If 7PM-7AM, please contact night-coverage If you still have difficulty reaching the attending provider, please page the [facility] (Director on Call) for [facility] on amion for assistance.

## 2020-01-23 NOTE — ED Notes (Signed)
Pt given breakfast tray

## 2020-01-23 NOTE — ED Notes (Signed)
Lab called. They need one more SST tube to be sent.

## 2020-01-23 NOTE — ED Notes (Signed)
Pt pulled purewick off again. Pt confused. Purewick replaced and pt reassured.

## 2020-01-23 NOTE — ED Notes (Signed)
Pt called out using call bell. Purewick had come out of place and pt bed wet with urine. Bottom sheet and chux removed and changed. Pericare performed. Purewick back in place and secured with clean brief. Pt repositioned herself in bed. Denies further needs at this time.

## 2020-01-24 ENCOUNTER — Inpatient Hospital Stay: Payer: 59

## 2020-01-24 ENCOUNTER — Inpatient Hospital Stay: Payer: Self-pay

## 2020-01-24 ENCOUNTER — Other Ambulatory Visit: Payer: Self-pay

## 2020-01-24 DIAGNOSIS — J9621 Acute and chronic respiratory failure with hypoxia: Secondary | ICD-10-CM | POA: Diagnosis not present

## 2020-01-24 DIAGNOSIS — J9622 Acute and chronic respiratory failure with hypercapnia: Secondary | ICD-10-CM

## 2020-01-24 DIAGNOSIS — J441 Chronic obstructive pulmonary disease with (acute) exacerbation: Secondary | ICD-10-CM | POA: Diagnosis not present

## 2020-01-24 DIAGNOSIS — I5033 Acute on chronic diastolic (congestive) heart failure: Secondary | ICD-10-CM | POA: Diagnosis not present

## 2020-01-24 LAB — BASIC METABOLIC PANEL
Anion gap: 11 (ref 5–15)
BUN: 28 mg/dL — ABNORMAL HIGH (ref 6–20)
BUN: 39 mg/dL — ABNORMAL HIGH (ref 6–20)
CO2: >50 mmol/L — ABNORMAL HIGH (ref 22–32)
CO2: 46 mmol/L — ABNORMAL HIGH (ref 22–32)
Calcium: 10.1 mg/dL (ref 8.9–10.3)
Calcium: 8.9 mg/dL (ref 8.9–10.3)
Chloride: 78 mmol/L — ABNORMAL LOW (ref 98–111)
Chloride: 83 mmol/L — ABNORMAL LOW (ref 98–111)
Creatinine, Ser: 0.49 mg/dL (ref 0.44–1.00)
Creatinine, Ser: 0.78 mg/dL (ref 0.44–1.00)
GFR, Estimated: >60 mL/min (ref >60)
GFR, Estimated: >60 mL/min (ref >60)
Glucose, Bld: 119 mg/dL — ABNORMAL HIGH (ref 70–99)
Glucose, Bld: 143 mg/dL — ABNORMAL HIGH (ref 70–99)
Potassium: 3.1 mmol/L — ABNORMAL LOW (ref 3.5–5.1)
Potassium: 4.2 mmol/L (ref 3.5–5.1)
Sodium: 145 mmol/L (ref 135–145)
Sodium: 140 mmol/L (ref 135–145)

## 2020-01-24 LAB — BLOOD GAS, ARTERIAL
Acid-Base Excess: 30.2 mmol/L — ABNORMAL HIGH (ref 0.0–2.0)
Acid-Base Excess: 38.1 mmol/L — ABNORMAL HIGH (ref 0.0–2.0)
Acid-Base Excess: 32.7 mmol/L — ABNORMAL HIGH (ref 0.0–2.0)
Allens test (pass/fail): POSITIVE — AB
Allens test (pass/fail): POSITIVE — AB
Bicarbonate: 61.7 mmol/L — ABNORMAL HIGH (ref 20.0–28.0)
Bicarbonate: 65.8 mmol/L — ABNORMAL HIGH (ref 20.0–28.0)
Bicarbonate: 65.1 mmol/L — ABNORMAL HIGH (ref 20.0–28.0)
Delivery systems: POSITIVE
Expiratory PAP: 8
FIO2: 0.4
FIO2: 0.4
FIO2: 0.6
Inspiratory PAP: 14
MECHVT: 500 mL
MECHVT: 400 mL
O2 Saturation: 93.9 %
O2 Saturation: 97.9 %
O2 Saturation: 99.6 %
PEEP: 5 cmH2O
PEEP: 5 cmH2O
Patient temperature: 37
Patient temperature: 37
Patient temperature: 37
RATE: 14 resp/min
RATE: 22 resp/min
RATE: 12 resp/min
pCO2 arterial: 102 mmHg (ref 32.0–48.0)
pCO2 arterial: 64 mmHg — ABNORMAL HIGH (ref 32.0–48.0)
pCO2 arterial: 110 mmHg (ref 32.0–48.0)
pH, Arterial: 7.39 (ref 7.350–7.450)
pH, Arterial: 7.62 (ref 7.350–7.450)
pH, Arterial: 7.38 (ref 7.350–7.450)
pO2, Arterial: 71 mmHg — ABNORMAL LOW (ref 83.0–108.0)
pO2, Arterial: 85 mmHg (ref 83.0–108.0)
pO2, Arterial: 190 mmHg — ABNORMAL HIGH (ref 83.0–108.0)

## 2020-01-24 LAB — GLUCOSE, CAPILLARY
Glucose-Capillary: 123 mg/dL — ABNORMAL HIGH (ref 70–99)
Glucose-Capillary: 137 mg/dL — ABNORMAL HIGH (ref 70–99)

## 2020-01-24 LAB — CBC
HCT: 45.1 % (ref 36.0–46.0)
Hemoglobin: 13.2 g/dL (ref 12.0–15.0)
MCH: 29.7 pg (ref 26.0–34.0)
MCHC: 29.3 g/dL — ABNORMAL LOW (ref 30.0–36.0)
MCV: 101.6 fL — ABNORMAL HIGH (ref 80.0–100.0)
Platelets: 276 10*3/uL (ref 150–400)
RBC: 4.44 MIL/uL (ref 3.87–5.11)
RDW: 11.6 % (ref 11.5–15.5)
WBC: 10.6 10*3/uL — ABNORMAL HIGH (ref 4.0–10.5)
nRBC: 0 % (ref 0.0–0.2)

## 2020-01-24 LAB — CALCIUM, IONIZED: Calcium, Ionized, Serum: 5 mg/dL (ref 4.5–5.6)

## 2020-01-24 LAB — MRSA PCR SCREENING: MRSA by PCR: NEGATIVE

## 2020-01-24 LAB — VITAMIN B12: Vitamin B-12: 521 pg/mL (ref 180–914)

## 2020-01-24 LAB — VITAMIN D 25 HYDROXY (VIT D DEFICIENCY, FRACTURES): Vit D, 25-Hydroxy: 52.7 ng/mL (ref 30–100)

## 2020-01-24 LAB — MAGNESIUM: Magnesium: 2.4 mg/dL (ref 1.7–2.4)

## 2020-01-24 LAB — PHOSPHORUS: Phosphorus: 4.3 mg/dL (ref 2.5–4.6)

## 2020-01-24 MED ORDER — SODIUM CHLORIDE 0.9 % IV SOLN
1.0000 g | INTRAVENOUS | Status: AC
Start: 2020-01-24 — End: 2020-01-28
  Administered 2020-01-24 – 2020-01-28 (×5): 1 g via INTRAVENOUS
  Filled 2020-01-24 (×2): qty 1
  Filled 2020-01-24: qty 10
  Filled 2020-01-24 (×2): qty 1

## 2020-01-24 MED ORDER — FENTANYL 2500MCG IN NS 250ML (10MCG/ML) PREMIX INFUSION
INTRAVENOUS | Status: AC
  Administered 2020-01-24: 50 ug/h via INTRAVENOUS
  Filled 2020-01-24: qty 250

## 2020-01-24 MED ORDER — PROPOFOL 1000 MG/100ML IV EMUL
0.0000 ug/kg/min | INTRAVENOUS | Status: DC
  Administered 2020-01-24 – 2020-01-25 (×2): 30 ug/kg/min via INTRAVENOUS
  Administered 2020-01-25: 29.826 ug/kg/min via INTRAVENOUS
  Administered 2020-01-25: 30 ug/kg/min via INTRAVENOUS
  Administered 2020-01-25 – 2020-01-26 (×4): 35 ug/kg/min via INTRAVENOUS
  Administered 2020-01-26: 25 ug/kg/min via INTRAVENOUS
  Administered 2020-01-27: 35 ug/kg/min via INTRAVENOUS
  Administered 2020-01-27: 20 ug/kg/min via INTRAVENOUS
  Administered 2020-01-27: 25 ug/kg/min via INTRAVENOUS
  Administered 2020-01-27: 35.069 ug/kg/min via INTRAVENOUS
  Administered 2020-01-28 (×3): 25 ug/kg/min via INTRAVENOUS
  Administered 2020-01-28: 25.127 ug/kg/min via INTRAVENOUS
  Administered 2020-01-29 (×3): 40 ug/kg/min via INTRAVENOUS
  Administered 2020-01-29: 25.127 ug/kg/min via INTRAVENOUS
  Administered 2020-01-29 – 2020-01-30 (×2): 40 ug/kg/min via INTRAVENOUS
  Administered 2020-01-30 – 2020-01-31 (×5): 30 ug/kg/min via INTRAVENOUS
  Administered 2020-01-31: 35 ug/kg/min via INTRAVENOUS
  Administered 2020-01-31 – 2020-02-01 (×3): 30 ug/kg/min via INTRAVENOUS
  Administered 2020-02-01 (×2): 25 ug/kg/min via INTRAVENOUS
  Administered 2020-02-02: 40 ug/kg/min via INTRAVENOUS
  Administered 2020-02-02: 30 ug/kg/min via INTRAVENOUS
  Administered 2020-02-02 (×2): 40 ug/kg/min via INTRAVENOUS
  Administered 2020-02-02: 35 ug/kg/min via INTRAVENOUS
  Administered 2020-02-02: 36.153 ug/kg/min via INTRAVENOUS
  Administered 2020-02-03: 40 ug/kg/min via INTRAVENOUS
  Administered 2020-02-03: 20 ug/kg/min via INTRAVENOUS
  Filled 2020-01-24 (×17): qty 100
  Filled 2020-01-24: qty 200
  Filled 2020-01-24 (×22): qty 100

## 2020-01-24 MED ORDER — ESCITALOPRAM OXALATE 10 MG PO TABS
20.0000 mg | ORAL_TABLET | Freq: Every day | ORAL | Status: DC
  Administered 2020-01-25 – 2020-02-03 (×10): 20 mg
  Filled 2020-01-24 (×12): qty 2

## 2020-01-24 MED ORDER — POTASSIUM CHLORIDE 20 MEQ PO PACK
40.0000 meq | PACK | Freq: Once | ORAL | Status: AC
  Administered 2020-01-24: 40 meq
  Filled 2020-01-24: qty 2

## 2020-01-24 MED ORDER — CHLORHEXIDINE GLUCONATE CLOTH 2 % EX PADS
6.0000 | MEDICATED_PAD | Freq: Every day | CUTANEOUS | Status: DC
  Administered 2020-01-24 – 2020-02-02 (×11): 6 via TOPICAL

## 2020-01-24 MED ORDER — ETOMIDATE 2 MG/ML IV SOLN
INTRAVENOUS | Status: AC
  Administered 2020-01-24: 20 mg via INTRAVENOUS
  Filled 2020-01-24: qty 10

## 2020-01-24 MED ORDER — LORAZEPAM 2 MG/ML IJ SOLN: 0.5000 mg | Freq: Once | INTRAMUSCULAR | Status: DC

## 2020-01-24 MED ORDER — FENTANYL CITRATE (PF) 100 MCG/2ML IJ SOLN
50.0000 ug | Freq: Once | INTRAMUSCULAR | Status: AC
  Administered 2020-01-24: 50 ug via INTRAVENOUS

## 2020-01-24 MED ORDER — POLYETHYLENE GLYCOL 3350 17 G PO PACK
17.0000 g | PACK | Freq: Every day | ORAL | Status: DC
  Administered 2020-01-24 – 2020-02-03 (×10): 17 g
  Filled 2020-01-24 (×10): qty 1

## 2020-01-24 MED ORDER — BUDESONIDE 0.5 MG/2ML IN SUSP
0.5000 mg | Freq: Two times a day (BID) | RESPIRATORY_TRACT | Status: DC
  Administered 2020-01-24 – 2020-02-11 (×35): 0.5 mg via RESPIRATORY_TRACT
  Filled 2020-01-24 (×39): qty 2

## 2020-01-24 MED ORDER — FENTANYL BOLUS VIA INFUSION
50.0000 ug | INTRAVENOUS | Status: DC | PRN
  Administered 2020-01-24 – 2020-01-28 (×4): 50 ug via INTRAVENOUS
  Filled 2020-01-24: qty 50

## 2020-01-24 MED ORDER — ACETAZOLAMIDE SODIUM 500 MG IJ SOLR
500.0000 mg | Freq: Two times a day (BID) | INTRAMUSCULAR | Status: DC
  Administered 2020-01-24 – 2020-01-29 (×12): 500 mg via INTRAVENOUS
  Filled 2020-01-24 (×16): qty 500

## 2020-01-24 MED ORDER — DOCUSATE SODIUM 50 MG/5ML PO LIQD
100.0000 mg | Freq: Two times a day (BID) | ORAL | Status: DC
  Administered 2020-01-24 – 2020-02-03 (×19): 100 mg
  Filled 2020-01-24 (×20): qty 10

## 2020-01-24 MED ORDER — ROFLUMILAST 500 MCG PO TABS
500.0000 ug | ORAL_TABLET | Freq: Every day | ORAL | Status: DC
  Administered 2020-01-25 – 2020-02-03 (×10): 500 ug
  Filled 2020-01-24 (×13): qty 1

## 2020-01-24 MED ORDER — ACETAMINOPHEN 325 MG PO TABS: 650.0000 mg | ORAL_TABLET | Freq: Four times a day (QID) | ORAL | Status: DC | PRN

## 2020-01-24 MED ORDER — ROCURONIUM BROMIDE 50 MG/5ML IV SOLN
INTRAVENOUS | Status: AC
  Administered 2020-01-24: 50 mg via INTRAVENOUS
  Filled 2020-01-24: qty 1

## 2020-01-24 MED ORDER — MIDAZOLAM HCL 2 MG/2ML IJ SOLN
2.0000 mg | Freq: Once | INTRAMUSCULAR | Status: AC
  Administered 2020-01-24: 2 mg via INTRAVENOUS

## 2020-01-24 MED ORDER — FENTANYL 2500MCG IN NS 250ML (10MCG/ML) PREMIX INFUSION
50.0000 ug/h | INTRAVENOUS | Status: DC
  Administered 2020-01-25 – 2020-01-26 (×4): 200 ug/h via INTRAVENOUS
  Administered 2020-01-27: 100 ug/h via INTRAVENOUS
  Administered 2020-01-27 – 2020-01-28 (×2): 200 ug/h via INTRAVENOUS
  Administered 2020-01-28: 180 ug/h via INTRAVENOUS
  Administered 2020-01-29 – 2020-01-30 (×3): 200 ug/h via INTRAVENOUS
  Administered 2020-01-31: 190 ug/h via INTRAVENOUS
  Administered 2020-01-31 – 2020-02-03 (×6): 200 ug/h via INTRAVENOUS
  Filled 2020-01-24 (×18): qty 250

## 2020-01-24 MED ORDER — GUAIFENESIN-CODEINE 100-10 MG/5ML PO SOLN
10.0000 mL | Freq: Four times a day (QID) | ORAL | Status: DC | PRN
  Administered 2020-01-28: 10 mL
  Filled 2020-01-24: qty 10

## 2020-01-24 MED ORDER — MORPHINE SULFATE (PF) 2 MG/ML IV SOLN
2.0000 mg | INTRAVENOUS | Status: DC | PRN
  Administered 2020-02-03 – 2020-02-05 (×5): 2 mg via INTRAVENOUS
  Filled 2020-01-24 (×5): qty 1

## 2020-01-24 MED ORDER — LORAZEPAM 2 MG/ML IJ SOLN
1.0000 mg | Freq: Once | INTRAMUSCULAR | Status: AC
  Administered 2020-01-24: 1 mg via INTRAVENOUS
  Filled 2020-01-24: qty 1

## 2020-01-24 MED ORDER — ALBUTEROL SULFATE (2.5 MG/3ML) 0.083% IN NEBU
5.0000 mg/h | INHALATION_SOLUTION | RESPIRATORY_TRACT | Status: DC
  Administered 2020-01-24: 5 mg/h via RESPIRATORY_TRACT
  Filled 2020-01-24: qty 12

## 2020-01-24 MED ORDER — ACETAMINOPHEN 650 MG RE SUPP: 650.0000 mg | Freq: Four times a day (QID) | RECTAL | Status: DC | PRN

## 2020-01-24 MED ORDER — ROCURONIUM BROMIDE 50 MG/5ML IV SOLN: 50.0000 mg | Freq: Once | INTRAVENOUS | Status: AC

## 2020-01-24 MED ORDER — HYDROXYZINE HCL 25 MG PO TABS
25.0000 mg | ORAL_TABLET | Freq: Three times a day (TID) | ORAL | Status: DC
  Administered 2020-01-24 – 2020-02-04 (×31): 25 mg
  Filled 2020-01-24 (×37): qty 1

## 2020-01-24 MED ORDER — CHLORHEXIDINE GLUCONATE 0.12% ORAL RINSE (MEDLINE KIT)
15.0000 mL | Freq: Two times a day (BID) | OROMUCOSAL | Status: DC
  Administered 2020-01-24 – 2020-02-03 (×20): 15 mL via OROMUCOSAL

## 2020-01-24 MED ORDER — FENTANYL CITRATE (PF) 100 MCG/2ML IJ SOLN: 100.0000 ug | Freq: Once | INTRAMUSCULAR | Status: AC

## 2020-01-24 MED ORDER — MONTELUKAST SODIUM 10 MG PO TABS
10.0000 mg | ORAL_TABLET | Freq: Every day | ORAL | Status: DC
  Administered 2020-01-24 – 2020-02-04 (×11): 10 mg
  Filled 2020-01-24 (×12): qty 1

## 2020-01-24 MED ORDER — AMLODIPINE BESYLATE 10 MG PO TABS: 10.0000 mg | ORAL_TABLET | Freq: Every day | ORAL | Status: DC

## 2020-01-24 MED ORDER — ACETAZOLAMIDE SODIUM 500 MG IJ SOLR
500.0000 mg | Freq: Two times a day (BID) | INTRAMUSCULAR | Status: DC
  Filled 2020-01-24: qty 500

## 2020-01-24 MED ORDER — DEXMEDETOMIDINE HCL IN NACL 400 MCG/100ML IV SOLN
0.4000 ug/kg/h | INTRAVENOUS | Status: DC
  Administered 2020-01-24: 0.4 ug/kg/h via INTRAVENOUS

## 2020-01-24 MED ORDER — ETOMIDATE 2 MG/ML IV SOLN: 20.0000 mg | Freq: Once | INTRAVENOUS | Status: AC

## 2020-01-24 MED ORDER — NOREPINEPHRINE 16 MG/250ML-% IV SOLN
0.0000 ug/min | INTRAVENOUS | Status: DC
  Administered 2020-01-24: 5 ug/min via INTRAVENOUS
  Filled 2020-01-24: qty 250

## 2020-01-24 MED ORDER — NOREPINEPHRINE 4 MG/250ML-% IV SOLN
0.0000 ug/min | INTRAVENOUS | Status: DC
  Administered 2020-01-24: 5 ug/min via INTRAVENOUS

## 2020-01-24 MED ORDER — ORAL CARE MOUTH RINSE
15.0000 mL | OROMUCOSAL | Status: DC
  Administered 2020-01-24 – 2020-02-03 (×99): 15 mL via OROMUCOSAL

## 2020-01-24 MED ORDER — PROPOFOL 1000 MG/100ML IV EMUL
INTRAVENOUS | Status: AC
  Administered 2020-01-24: 10 ug/kg/min via INTRAVENOUS
  Filled 2020-01-24: qty 100

## 2020-01-24 MED ORDER — FAMOTIDINE IN NACL 20-0.9 MG/50ML-% IV SOLN
20.0000 mg | Freq: Two times a day (BID) | INTRAVENOUS | Status: DC
  Administered 2020-01-24 – 2020-01-31 (×15): 20 mg via INTRAVENOUS
  Filled 2020-01-24 (×17): qty 50

## 2020-01-24 MED ORDER — POTASSIUM CHLORIDE CRYS ER 20 MEQ PO TBCR: 40.0000 meq | EXTENDED_RELEASE_TABLET | Freq: Once | ORAL | Status: DC

## 2020-01-24 MED ORDER — QUETIAPINE FUMARATE 25 MG PO TABS
50.0000 mg | ORAL_TABLET | Freq: Every day | ORAL | Status: DC
  Administered 2020-01-24 – 2020-02-04 (×11): 50 mg
  Filled 2020-01-24 (×12): qty 2

## 2020-01-24 MED ORDER — FENTANYL CITRATE (PF) 100 MCG/2ML IJ SOLN
INTRAMUSCULAR | Status: AC
  Administered 2020-01-24: 100 ug via INTRAVENOUS
  Filled 2020-01-24: qty 2

## 2020-01-24 MED ORDER — ADULT MULTIVITAMIN W/MINERALS CH
1.0000 | ORAL_TABLET | Freq: Every day | ORAL | Status: DC
  Administered 2020-01-24: 1
  Filled 2020-01-24: qty 1

## 2020-01-24 MED ORDER — AZITHROMYCIN 500 MG IV SOLR
500.0000 mg | INTRAVENOUS | Status: AC
  Administered 2020-01-24 – 2020-01-28 (×5): 500 mg via INTRAVENOUS
  Filled 2020-01-24 (×5): qty 500

## 2020-01-24 MED ORDER — MORPHINE SULFATE (PF) 2 MG/ML IV SOLN
INTRAVENOUS | Status: AC
  Administered 2020-01-24: 2 mg via INTRAVENOUS
  Filled 2020-01-24: qty 1

## 2020-01-24 MED ORDER — POTASSIUM CHLORIDE 10 MEQ/100ML IV SOLN
10.0000 meq | INTRAVENOUS | Status: AC
  Administered 2020-01-24 (×4): 10 meq via INTRAVENOUS
  Filled 2020-01-24 (×4): qty 100

## 2020-01-24 NOTE — Plan of Care (Signed)
Discussed with patient plan of care for the remainder of the evening, pain management and admission questions with some teach back displayed.  Patient needs reinforcement on not getting out the bed and leaving on Bipap unlike her Cpap at home.   Problem: Education: Goal: Knowledge of General Education information will improve Description: Including pain rating scale, medication(s)/side effects and non-pharmacologic comfort measures 01/24/2020 0444 by Jannette Fogo, RN Outcome: Progressing 01/24/2020 0444 by Jannette Fogo, RN Outcome: Progressing   Problem: Activity: Goal: Risk for activity intolerance will decrease Outcome: Progressing   Problem: Pain Managment: Goal: General experience of comfort will improve 01/24/2020 0444 by Jannette Fogo, RN Outcome: Progressing 01/24/2020 0444 by Jannette Fogo, RN Outcome: Progressing   Problem: Health Behavior/Discharge Planning: Goal: Ability to manage health-related needs will improve Outcome: Progressing

## 2020-01-24 NOTE — Procedures (Signed)
Central Venous Catheter Insertion Procedure Note  Misty Green  [id number]  1970/07/01  Date:01/24/20  Time:2:43 PM   Provider Performing:Euell Schiff D Dewaine Conger   Procedure: Insertion of Non-tunneled Central Venous [phone]) with US guidance ([id number])   Indication(s) Medication administration and Difficult access  Consent Risks of the procedure as well as the alternatives and risks of each were explained to the patient and/or caregiver.  Consent for the procedure was obtained and is signed in the bedside chart  Anesthesia Topical only with 1% lidocaine   Timeout Verified patient identification, verified procedure, site/side was marked, verified correct patient position, special equipment/implants available, medications/allergies/relevant history reviewed, required imaging and test results available.  Sterile Technique Maximal sterile technique including full sterile barrier drape, hand hygiene, sterile gown, sterile gloves, mask, hair covering, sterile ultrasound probe cover (if used).  Procedure Description Area of catheter insertion was cleaned with chlorhexidine and draped in sterile fashion.  With real-time ultrasound guidance a central venous catheter was placed into the left internal jugular vein. Nonpulsatile blood flow and easy flushing noted in all ports.  The catheter was sutured in place and sterile dressing applied.  Complications/Tolerance None; patient tolerated the procedure well. Chest X-ray is ordered to verify placement for internal jugular or subclavian cannulation.   Chest x-ray is not ordered for femoral cannulation.  EBL Minimal  Specimen(s) None   Line secured at the 20 cm mark.  BIOPATCH applied to the insertion site.    Misty Green, AGACNP-BC  Pulmonary & Critical Care Medicine Pager: [phone]

## 2020-01-24 NOTE — ED Notes (Addendum)
Pt removed bipap once again. Placed back on face and pt encouraged to rest.

## 2020-01-24 NOTE — Progress Notes (Signed)
Spoke with Apolonio Schneiders, RN regarding PICC order.  Made aware that due to extremely short staffing today PICC would not be able to be placed until tomorrow.  According to chart patient has 3 PIVs which is adequate for ordered IV meds at this time.  Apolonio Schneiders will notify MD.

## 2020-01-24 NOTE — Progress Notes (Signed)
Patient stated that she did do marijuana prior to hospital visit.

## 2020-01-24 NOTE — Progress Notes (Signed)
Pt's post intubation ABG with marked Metabolic Alkalosis (pH 3.75, pCO 64, pO2 85, Bicarb 65.8), these settings were obtained on a TV 450 and Rate 22.   Discussed with Dr. Mortimer Fries.  Vent settings changed to TV 400, Rate 12.  Will obtain follow up ABG in 2 hrs @ 19:00.  Respiratory Therapy aware.    Darel Hong, AGACNP-BC [place] [facility] Pager: [phone]

## 2020-01-24 NOTE — ED Notes (Addendum)
Pt found to have removed Bipap and disassembled it. IV and purewick removed. Pt confused and apologizing for removing equipment and states she does not know why she did it. Respiratory called and Bipap put back into place. Purewick replaced. Gown changed. Pt instructed to remain in bed and leave equipment in place. Bed alarm remains in place. Door open with visual to pt. Colostomy emptied of 150 ml of liquid stool.

## 2020-01-24 NOTE — Consult Note (Signed)
NAME:  Misty Green, MRN:  [id number], DOB:  1970-08-17, LOS: 2 ADMISSION DATE:  01/22/2020, CONSULTATION DATE:  01/24/2020 REFERRING MD:  Dr. Dwyane Dee, CHIEF COMPLAINT: Shortness of Breath   Brief History:  50 y.o. Female admitted 01/22/20 with Acute on Chronic Hypoxic Hypercapnic Respiratory Failure in the setting of Acute COPD Exacerbation & Acute on Chronic HFpEF requiring BiPAP.  History of Present Illness:  Misty Green is a 50 y.o. Female with a past medical history significant for chronic hypoxic hypercapnic respiratory failure requiring 3.5L of supplemental O2, COPD, chronic HFpEF, chronic tobacco abuse, and generalized anxiety disorder, who presented to [facility] ED on 01/22/20 due to complaints of shortness of breath.  She reported 2-3 days of progressive shortness of breath with associated mild nonproductive cough. She tried increasing frequency of her albuterol inhaler and good compliance with her home regimen, however did not help with progression of shortness of breath.  She also reported associated orthopnea without significant increase in edema to the bilateral lower extremities.  Denied fever, chills, rigors, chest pain, or generalized myalgias, nausea, vomiting, abdominal pain.  Denies any known COVID-19 exposures.  She does endorse that she continues to smoke, approximately a quarter pack per day.  ED Course:  Vital signs in the ED were notable for the following: Temperature max 97.7, heart rate 76-85; blood pressure 158/77, respiratory rate 20-25; initial oxygen saturation on her baseline continuous 2.5 L nasal cannula was in the mid 60s, with ensuing improvement into the mid 90s on 4 to 5 L nasal cannula, before supplemental oxygen delivery was transitioned to BiPAP for enhanced patient comfort as well as management of acute on chronic hypercapnia.   Labs were notable for the following: Presenting VBG on her baseline continuous 3.5 L nasal cannula showed the following: 7.28/greater  than 120.  CMP was notable for the following: Sodium 141, potassium 4.4, bicarbonate 41, creatinine 0.52.  High-sensitivity troponin high x1 found to be 8.  CBC notable for white blood cell count of 8100, hemoglobin 13.6.  EKG showed sinus arrhythmia with ventricular rate 76, nonspecific T wave inversion in V1, and no evidence of ST changes, including no evidence of ST elevation.  Chest x-ray showed increase in central pulmonary vascular congestion as well as interlobular septal thickening with reticular opacities consistent with acute congestive heart failure, while showing no evidence of pneumothorax or large pleural effusion.  Nasopharyngeal COVID-19/influenza PCR performed in the ED today were found to be negative.  While in the ED, the following were administered: Lasix 40 mg IV x1, duo nebulizer treatment x1, Solu-Medrol 125 mg IV x1, and magnesium sulfate 2 g IV over 2 hours x 1 dose  She was admitted to the [facility] unit for further workup and treatment of Acute on Chronic Hypoxic Hypercapnic Respiratory Failure in the setting of Acute COPD Exacerbation & Acute on Chronic HFpEF requiring BiPAP.  Hospital Course: On 01/24/20 in the Stepdown unit, she is noted to have respiratory distress and increased work of breathing in BiPAP.  PCCM is consulted for assistance in management.  She is high risk for intubation.   Past Medical History:  Chronic Hypoxic Hypercapnic Respiratory Failure on 3.5L supplemental O2 COPD HFpEF Tobacco abuse Generalized Anxiety Disorder  Significant Hospital Events:  1/4: Admission to Stepdown 1/6: Increased work of breathing on BiPAP, PCCM consulted, high risk for intubation  Consults:  Hospitalist (primary service) PCCM  Procedures:  N/A  Significant Diagnostic Tests:  1/4: CXR>>Cardiomediastinal silhouette unchanged in size and contour. Fullness  in the central vasculature. Interlobular septal thickening with reticular opacities. No pneumothorax or  large pleural effusion.No confluent airspace disease. Concerning for Acute CHF  Micro Data:  1/4: SARS-CoV-2 PCR>> negative 1/4: Influenza A&B PCR>> negative 1/5: HIV screen>> nonreactive  Antimicrobials:  Azithromycin 1/6>> Ceftriaxone 1/6>>  Interim History / Subjective:  Pt on BiPAP, with increased work of breathing Reports she is struggling to breathe Noted to have diffuse inspiratory and expiratory wheezing, along with fine crackles Denies chest pain, abdominal pain, N/V/D, fever/chills Will add morphine for air hunger, continuous Neb, pulmicort nebs, Azithromycin & Rocephin, and obtain CXR High RISK FOR INTUBATION  Objective   Blood pressure (!) 156/106, pulse (!) 31, temperature 97.8 F (36.6 C), temperature source Axillary, resp. rate 20, height 5\' 4"  (1.626 m), weight 92.2 kg, SpO2 94 %.    FiO2 (%):  [50 %] 50 %   Intake/Output Summary (Last 24 hours) at 01/24/2020 0748 Last data filed at 01/24/2020 0500 Gross per 24 hour  Intake --  Output 825 ml  Net -825 ml   Filed Weights   01/22/20 1347 01/24/20 0250  Weight: 97.5 kg 92.2 kg    Examination: General: Acute on chronically ill appearing female, sitting in bed, restless on BiPAP, with moderate respiratory distress HENT: Atraumatic, normocephalic, neck supple, no JVD Lungs: Diffuse inspiratory and inspiratory wheezing, along with fine crackles, tachypnea, increased work of breathing with assessory muscle use Cardiovascular: Tachycardia, regular rhythm, s1s2, no M/R/G, 2+ distal pulses Abdomen: Obese, soft, nontender, nondistended, no guarding or rebound tenderness, BS+ x4 Extremities: Normal bulk and tone, no deformities, no edema Neuro: Awake, orientation difficult to assess due to BiPAP and work of breathing, follows commands, no focal deficits Skin: Warm and dry. No obvious rashes, lesions, or ulcerations  Resolved Hospital Problem list   N/A  Assessment & Plan:   Acute on chronic hypoxic hypercapnic  respiratory failure in setting of COPD exacerbation and acute on chronic HFpEF Hx: COPD, Wears 3.5 L nasal cannula at baseline -Supplemental O2 as needed to maintain O2 sats 88 to 94% -Currently on BiPAP, wean as tolerated -High risk for intubation -Follow intermittent chest x-ray and ABG as needed -Scheduled bronchodilators -IV Steroids -Add Pulmicort nebs -Continue home Singulair -Will trial Precedex to help tolerate BiPAP -Prn Morphine for air hunger -Will give continuous Neb x1 -Add Azithromycin & Rocephin -Encourage smoking cessation   Acute on chronic HFpEF (Most recent echocardiogram inMarch 2021) -Continuous cardiac monitoring -Maintain MAP greater than 65 -Continue Lasix 40 mg twice daily as renal function and blood pressure permits; consider changing to Diamox given severe metabolic alkalosis -Strict I&O's -Consider repeat echocardiogram   Hypokalemia, likely in setting of aggressive diuresis with Lasix Metabolic Alkalosis -Monitor I&O's / urinary output -Follow BMP -Ensure adequate renal perfusion -Avoid nephrotoxic agents as able -Replace electrolytes as indicated -Consider dose of Diamox    Best practice (evaluated daily)  Diet: Heart healthy Pain/Anxiety/Delirium protocol (if indicated): Continue home Lexapro and as needed Xanax/Ativan, Precedex VAP protocol (if indicated): N/A DVT prophylaxis: Lovenox subcu GI prophylaxis: N/A Glucose control: N/A Mobility: As tolerated Disposition: Stepdown  Goals of Care:  Last date of multidisciplinary goals of care discussion: 01/24/2020 Family and staff present: Discussed with patient at bedside and RN Summary of discussion: Wean BiPAP as tolerated, bronchodilators and steroids, diuresis Follow up goals of care discussion due: 01/25/2020 Code Status: Full code  Labs   CBC: Recent Labs  Lab 01/22/20 1340 01/23/20 0830 01/24/20 0536  WBC 8.1 6.7 10.6*  NEUTROABS 6.3  --   --   HGB 13.6 13.3 13.2  HCT 45.6  44.8 45.1  MCV 101.8* 101.4* 101.6*  PLT 225 259 161    Basic Metabolic Panel: Recent Labs  Lab 01/22/20 1340 01/23/20 0830  NA 141 142  K 4.4 3.9  CL 86* 81*  CO2 41* 49*  GLUCOSE 140* 119*  BUN 15 17  CREATININE 0.52 0.47  CALCIUM 9.6 10.2  MG  --  2.6*  PHOS  --  3.1   GFR: Estimated Creatinine Clearance: 92.6 mL/min (by C-G formula based on SCr of 0.47 mg/dL). Recent Labs  Lab 01/22/20 1340 01/22/20 1600 01/23/20 0830 01/24/20 0536  WBC 8.1  --  6.7 10.6*  LATICACIDVEN  --  2.1*  --   --     Liver Function Tests: Recent Labs  Lab 01/22/20 1340  AST 32  ALT 18  ALKPHOS 48  BILITOT 0.3  PROT <3.0*  ALBUMIN <1.0*   No results for input(s): LIPASE, AMYLASE in the last 168 hours. No results for input(s): AMMONIA in the last 168 hours.  ABG    Component Value Date/Time   PHART 7.42 01/23/2020 1555   PCO2ART 102 (HH) 01/23/2020 1555   PO2ART 68 (L) 01/23/2020 1555   HCO3 66.2 (H) 01/23/2020 1555   TCO2 >50 (H) 07/09/2019 0528   ACIDBASEDEF 1.1 05/20/2018 1240   O2SAT 93.6 01/23/2020 1555     Coagulation Profile: No results for input(s): INR, PROTIME in the last 168 hours.  Cardiac Enzymes: No results for input(s): CKTOTAL, CKMB, CKMBINDEX, TROPONINI in the last 168 hours.  HbA1C: No results found for: HGBA1C  CBG: No results for input(s): GLUCAP in the last 168 hours.  Review of Systems:   Positives in BOLD: Gen: Denies fever, chills, weight change, fatigue, night sweats HEENT: Denies blurred vision, double vision, hearing loss, tinnitus, sinus congestion, rhinorrhea, sore throat, neck stiffness, dysphagia PULM: Denies +shortness of breath, cough, sputum production, hemoptysis, +wheezing CV: Denies chest pain, edema, orthopnea, paroxysmal nocturnal dyspnea, palpitations GI: Denies abdominal pain, nausea, vomiting, diarrhea, hematochezia, melena, constipation, change in bowel habits GU: Denies dysuria, hematuria, polyuria, oliguria, urethral  discharge Endocrine: Denies hot or cold intolerance, polyuria, polyphagia or appetite change Derm: Denies rash, dry skin, scaling or peeling skin change Heme: Denies easy bruising, bleeding, bleeding gums Neuro: Denies headache, numbness, weakness, slurred speech, loss of memory or consciousness   Past Medical History:  She,  has a past medical history of Cardiac arrest ([place]) (05/12/2018), CHF (congestive heart failure) ([place]), COPD (chronic obstructive pulmonary disease) ([place]), Current smoker, Dyspnea, GERD (gastroesophageal reflux disease), and Opiate abuse, continuous ([place]).   Surgical History:   Past Surgical History:  Procedure Laterality Date  . ABDOMINAL HYSTERECTOMY    . BACK SURGERY    . cesction    . COLOSTOMY  01/13/2018   Procedure: COLOSTOMY Creation;  Surgeon: Jules Husbands, MD;  Location: [facility] ORS;  Service: General;;  . INCISIONAL HERNIA REPAIR     lower midline laparotomy incision (hysterectomy), repaired with mesh  . IR FLUORO GUIDE CV LINE RIGHT  05/26/2018  . IR REMOVAL TUN CV CATH W/O FL  06/02/2018  . IR US GUIDE VASC ACCESS RIGHT  05/26/2018  . LAPAROSCOPIC CHOLECYSTECTOMY  2012  . LAPAROSCOPIC LYSIS OF ADHESIONS  01/13/2018   Procedure: LAPAROSCOPIC LYSIS OF ADHESIONS;  Surgeon: Jules Husbands, MD;  Location: [facility] ORS;  Service: General;;  . LAPAROSCOPIC SIGMOID COLECTOMY  01/13/2018   Procedure: LAPAROSCOPIC  SIGMOID COLECTOMY;  Surgeon: Jules Husbands, MD;  Location: [facility] ORS;  Service: General;;  . ORIF WRIST FRACTURE Right 08/25/2015   Procedure: OPEN REDUCTION INTERNAL FIXATION (ORIF) WRIST FRACTURE;  Surgeon: Corky Mull, MD;  Location: [facility] ORS;  Service: Orthopedics;  Laterality: Right;     Social History:   reports that she has been smoking cigarettes. She has smoked for the past 15.00 years. She has never used smokeless tobacco. She reports current alcohol use. She reports current drug use. Drug: Marijuana.   Family History:  Her family history includes  COPD in her mother; Diabetes in her father; Hypertension in her father.   Allergies Allergies  Allergen Reactions  . Other Nausea And Vomiting    Anti-depressent that starts with t     Home Medications  Prior to Admission medications   Medication Sig Start Date End Date Taking? Authorizing Provider  DALIRESP 500 MCG TABS tablet TAKE 1 TABLET BY MOUTH DAILY Patient taking differently: Take 500 mcg by mouth daily. 08/27/19  Yes Lavera Guise, MD  escitalopram (LEXAPRO) 20 MG tablet TAKE 1 TABLET BY MOUTH EVERY DAY Patient taking differently: Take 20 mg by mouth daily. 07/20/19  Yes Boscia, Greer Ee, NP  Fluticasone-Umeclidin-Vilant (TRELEGY ELLIPTA) 100-62.5-25 MCG/INH AEPB Inhale 1 each into the lungs daily. 07/19/19  Yes Scarboro, Audie Clear, NP  furosemide (LASIX) 40 MG tablet Take 40 mg by mouth daily. 01/20/20  Yes [provider]  gabapentin (NEURONTIN) 400 MG capsule Take 1 capsule (400 mg total) by mouth 3 (three) times daily. 07/12/19  Yes Mercy Riding, MD  Melatonin 10 MG TABS Take 30 mg by mouth at bedtime.   Yes [provider]  montelukast (SINGULAIR) 10 MG tablet TAKE 1 TABLET BY MOUTH EVERYDAY AT BEDTIME Patient taking differently: Take 10 mg by mouth at bedtime. 07/30/19  Yes Scarboro, Audie Clear, NP  Multiple Vitamins-Minerals (MULTIVITAMIN WITH MINERALS) tablet Take 1 tablet by mouth at bedtime.    Yes [provider]  pantoprazole (PROTONIX) 40 MG tablet Take 40 mg by mouth 2 (two) times daily. 11/11/19  Yes [provider]  QUEtiapine (SEROQUEL) 50 MG tablet TAKE 1 TABLET BY MOUTH EVERYDAY AT BEDTIME Patient taking differently: Take 50 mg by mouth at bedtime. 08/27/19  Yes Lavera Guise, MD  acetaminophen (TYLENOL) 325 MG tablet Take 1-2 tablets (325-650 mg total) by mouth every 4 (four) hours as needed for mild pain. Patient taking differently: Take 650 mg by mouth 3 (three) times daily. 06/05/18   Love, Ivan Anchors, PA-C  albuterol (PROVENTIL HFA;VENTOLIN  HFA) 108 (90 Base) MCG/ACT inhaler Inhale 2 puffs into the lungs every 6 (six) hours as needed for wheezing or shortness of breath.     [provider]  albuterol (PROVENTIL) (2.5 MG/3ML) 0.083% nebulizer solution Take 2.5 mg by nebulization every 6 (six) hours as needed for wheezing or shortness of breath. Inhale one vial via nebulizer 4 times a day as directed.    [provider]  amLODipine (NORVASC) 10 MG tablet TAKE 1 TABLET BY MOUTH EVERY DAY Patient not taking: No sig reported 08/27/19   Lavera Guise, MD  OZEMPIC, 0.25 OR 0.5 MG/DOSE, 2 MG/1.5ML SOPN Inject 0.25 mg into the skin once a week. 10/02/19   [provider]  PRESCRIPTION MEDICATION Inhale into the lungs at bedtime. BiPap    [provider]  Vitamin D, Ergocalciferol, (DRISDOL) 1.25 MG (50000 UNIT) CAPS capsule Take 50,000 Units by mouth once  a week. 11/13/19   [provider]     Critical care time: 61  minutes    Darel Hong, [facility] [place] [facility] Pager: [phone]

## 2020-01-24 NOTE — Progress Notes (Signed)
[facility] Progress Note  Patient: Misty Green    TML:[id number]  DOA: 01/22/2020     Date of Service: the patient was seen and examined on 01/24/2020  Chief Complaint  Patient presents with  . Shortness of Breath   Brief hospital course: Misty Green is a 50 y.o. female with medical history significant for chronic hypoxic hypercapnic respiratory failure on 3.5 L continuous nasal cannula, severe COPD, chronic diastolic heart failure, chronic tobacco abuse, generalized anxiety disorder, who is admitted to [facility] on 01/22/2020 with acute on chronic hypoxic hypercapnic respiratory failure in the setting of acute COPD exacerbation acute on chronic diastolic heart failure after presenting from home to [facility] Emergency Department complaining of shortness of breath.     Assessment and Plan:  Principal Problem:   Acute on chronic respiratory failure with hypoxia and hypercapnia (HCC) Active Problems:   COPD exacerbation (HCC)   Anxiety   Tobacco user   Chronic obstructive pulmonary disease (HCC)   Acute on chronic diastolic CHF (congestive heart failure) (HCC)    #) Acute on chronic hypoxic hypercapnic respiratory failure due to COPD exacerbation And H/o chronic diastolic heart failure,  baseline supplemental oxygen requirement of continuous 3.5 L nasal cannula Sob appears to be multifactorial in nature, with suspected initial contribution from acute COPD exacerbation, with ensuing development of acute on chronic diastolic heart failure, with suspected decompensation felt to be on the basis of stress due to initial copd exacerbation.   no evidence of pneumonia and COVID-19/influenza PCR were found to be negative Currently on BiPAP for treatment of acute on chronic hypercapnic element as well as for patient comfort in the setting of concomitant acutely decompensated heart failure.    Plan:  DuoNeb scheduled every 6 hours while awake.   As needed  albuterol nebulizer.   Solu-Medrol 40 mg IV every 6 hours.   Monitor on telemetry.  Monitor continuous pulse oximetry.   Check serum phosphorus level.  will also repeat serum magnesium level.   Continue home Daliresp and Singulair.  Check ABG prn 1/6 patient was transferred in the ICU due to continuous need of BiPAP and ended up getting intubated due to anxiety and respiratory distress Pulmonary critical care help appreciated.   #) Acute on chronic diastolic heart failure: chronic diastolic heart failure, with most recent echocardiogram in March 2021,   2 to 3 days of progressive shortness of breath associated with orthopnea as well as evidence of acutely decompensated heart failure on chest x-ray, as further described above.  Suspect contribution from stress relating to preceding acute COPD exacerbation, as above.  Patient reports good compliance with her home diuretic regimen consisting of Lasix 20 mg p.o. twice daily.  Presentation not associated any chest pain, and ACS is felt to be less likely in the absence of any acute ischemic changes on presenting EKG, while high-sensitivity troponin I was found to be nonelevated, and even after 48 to 72 hours of ongoing shortness of breath.  She received Lasix 40 mg IV x1 in the ED this evening.  Lasix 40 mg IV twice daily.   Plan: monitor strict I's & O's and daily weights.  Monitor on telemetry, including trend in HR in response to diuresis,  Monitor continuous pulse oximetry.  Repeat BMP in the morning, including for monitoring of trend of potassium, bicarbonate, and renal function in response to interval diuresis efforts.  Check serum magnesium level.  Will reassess volume status in the morning and clinically  correlate to help guide additional diuresis efforts.  Close monitoring of ensuing blood pressure with diuresis efforts, particular in the setting of current BiPAP use, with associated monitoring for development of hypotension, as well as to  assess adequacy of afterload reduction.   Consider repeat echocardiogram once more euvolemic as it is almost been 1 year since her most recent prior with interval acute exacerbation.  1/5 Breo Ellipta inhaler    #) Chronic tobacco abuse: The patient knowledges that she is a current smoker, having smoked approximately half pack per day over the last 15 years, but acknowledges slight decline in her daily smoking habits down to approximately 1/4 pack/day.   Plan: Counseled the patient on the importance of complete discontinuation of smoking, particular given her history of severe COPD as well as chronic heart failure.  As needed nicotine patch has been ordered for use during this hospitalization.     #) Generalized anxiety disorder: On Lexapro as well as as needed Xanax at home.  Plan: Continue home Lexapro.  Will hold home as needed Xanax for now.  Instead, ordered as needed IV Ativan for now. Started Atarax   Body mass index is 36.9 kg/m.  Interventions:      Diet: Heart healthy  DVT Prophylaxis: Subcutaneous Lovenox   Advance goals of care discussion: Full code  Family Communication: family was not present at bedside, at the time of interview.  The pt provided permission to discuss medical plan with the family. Opportunity was given to ask question and all questions were answered satisfactorily.   Disposition:  Pt is from Home, admitted with respiratory failure, still has respiratory failure, which precludes a safe discharge. Discharge to home, when respiratory failure improves and she is able to be extubated.    Subjective: Since overnight patient's condition got worse, patient was having severe respiratory distress and she was transferred into the ICU, patient was completely obtunded due to morphine and Precedex was started in the ICU.  Patient was on BiPAP and sedated.  Later on patient was intubated by pulmonary critical care. Was not in a condition to  communicate.  Physical Exam: General: Sedated  Eyes: closed ENT: On BiPAP Neck: no JVD,  Cardiovascular: S1 and S2 Present, no Murmur,  Respiratory: increased respiratory effort, Bilateral Air entry equal and Decreased, mild  Crackles, Positive wheezes, on Bipap Abdomen: Bowel Sound present, Soft and no tenderness,  Skin: no rashes Extremities: no Pedal edema, no calf tenderness Neurologic: without any new focal findings, sedated Gait not checked due to patient safety concerns  Vitals:   01/24/20 1430 01/24/20 1500 01/24/20 1530 01/24/20 1545  BP: (!) 118/97 107/90 (!) 80/62 (!) 156/111  Pulse: 94 82 84 (!) 54  Resp: 18 (!) 22 (!) 22 16  Temp:      TempSrc:      SpO2: 98% 99% 97% 98%  Weight:      Height:        Intake/Output Summary (Last 24 hours) at 01/24/2020 1727 Last data filed at 01/24/2020 1554 Gross per 24 hour  Intake 919.84 ml  Output 1175 ml  Net -255.16 ml   Filed Weights   01/22/20 1347 01/24/20 0250  Weight: 97.5 kg 92.2 kg    Data Reviewed: I have personally reviewed and interpreted daily labs, tele strips, imagings as discussed above. I reviewed all nursing notes, pharmacy notes, vitals, pertinent old records I have discussed plan of care as described above with RN and patient/family.  CBC: Recent Labs  Lab 01/22/20 1340 01/23/20 0830 01/24/20 0536  WBC 8.1 6.7 10.6*  NEUTROABS 6.3  --   --   HGB 13.6 13.3 13.2  HCT 45.6 44.8 45.1  MCV 101.8* 101.4* 101.6*  PLT 225 259 220   Basic Metabolic Panel: Recent Labs  Lab 01/22/20 1340 01/23/20 0830 01/24/20 0536  NA 141 142 145  K 4.4 3.9 3.1*  CL 86* 81* 78*  CO2 41* 49* >50*  GLUCOSE 140* 119* 119*  BUN 15 17 28*  CREATININE 0.52 0.47 0.49  CALCIUM 9.6 10.2 10.1  MG  --  2.6* 2.4  PHOS  --  3.1 4.3    Studies: DG Abd 1 View  Result Date: 01/24/2020 CLINICAL DATA:  Orogastric tube placement. EXAM: ABDOMEN - 1 VIEW COMPARISON:  January 06, 2018. FINDINGS: The bowel gas pattern is  normal. Distal tip of nasogastric tube is seen in expected position of distal stomach. No radio-opaque calculi or other significant radiographic abnormality are seen. IMPRESSION: Distal tip of nasogastric tube seen in expected position of distal stomach. Electronically Signed   By: Marijo Conception M.D.   On: 01/24/2020 15:04   DG Chest Port 1 View  Result Date: 01/24/2020 CLINICAL DATA:  Intubation. EXAM: PORTABLE CHEST 1 VIEW COMPARISON:  01/24/2020. FINDINGS: Endotracheal tube noted with tip 3 cm above the carina. NG tube noted with tip below left hemidiaphragm. Left IJ line noted with tip at cavoatrial junction. Heart size normal. Low lung volumes. Mild left base interstitial infiltrate cannot be excluded. Bilateral calcified pulmonary nodules consistent prior granulomas disease. No pleural effusion or pneumothorax. IMPRESSION: 1. Lines and tubes in good anatomic position. 2. Low lung volumes. Mild left base interstitial infiltrate cannot be excluded. Electronically Signed   By: Marcello Moores  Register   On: 01/24/2020 15:05   DG Chest Port 1 View  Result Date: 01/24/2020 CLINICAL DATA:  Acute respiratory distress with shortness of breath. EXAM: PORTABLE CHEST 1 VIEW COMPARISON:  January 22, 2020. FINDINGS: Stable cardiomediastinal silhouette with mild central pulmonary vascular congestion. No pneumothorax or pleural effusion is noted. Stable calcified granulomata are noted bilaterally. No consolidative process is noted. The visualized skeletal structures are unremarkable. IMPRESSION: Mild central pulmonary vascular congestion. Electronically Signed   By: Marijo Conception M.D.   On: 01/24/2020 09:03   [place] EKG SITE RITE  Result Date: 01/24/2020 If Site Rite image not attached, placement could not be confirmed due to current cardiac rhythm.   Scheduled Meds: . acetaZOLAMIDE  500 mg Intravenous Q12H  . budesonide (PULMICORT) nebulizer solution  0.5 mg Nebulization BID  . chlorhexidine gluconate (MEDLINE KIT)  15  mL Mouth Rinse BID  . Chlorhexidine Gluconate Cloth  6 each Topical Q0600  . docusate  100 mg Per Tube BID  . enoxaparin (LOVENOX) injection  0.5 mg/kg Subcutaneous Q24H  . [START ON 01/25/2020] escitalopram  20 mg Per Tube Daily  . fluticasone  1 spray Each Nare QHS  . hydrOXYzine  25 mg Per Tube TID  . ipratropium-albuterol  3 mL Nebulization Q6H  . mouth rinse  15 mL Mouth Rinse 10 times per day  . methylPREDNISolone (SOLU-MEDROL) injection  40 mg Intravenous Q6H  . montelukast  10 mg Per Tube QHS  . multivitamin with minerals  1 tablet Per Tube QHS  . polyethylene glycol  17 g Per Tube Daily  . QUEtiapine  50 mg Per Tube QHS  . [START ON 01/25/2020] roflumilast  500 mcg Per Tube Daily   Continuous  Infusions: . albuterol 5 mg/hr (01/24/20 0837)  . azithromycin Stopped (01/24/20 1230)  . cefTRIAXone (ROCEPHIN)  IV Stopped (01/24/20 1045)  . dexmedetomidine (PRECEDEX) IV infusion Stopped (01/24/20 1355)  . famotidine (PEPCID) IV 100 mL/hr at 01/24/20 1554  . fentaNYL infusion INTRAVENOUS 200 mcg/hr (01/24/20 1554)  . norepinephrine (LEVOPHED) Adult infusion 8 mcg/min (01/24/20 1554)  . propofol (DIPRIVAN) infusion 35 mcg/kg/min (01/24/20 1554)   PRN Meds: acetaminophen **OR** acetaminophen, albuterol, fentaNYL, guaiFENesin-codeine, LORazepam, morphine injection, nicotine, ondansetron (ZOFRAN) IV  Time spent: 35 minutes  Author: Val Riles. MD [facility] 01/24/2020 5:27 PM  To reach On-call, see care teams to locate the attending and reach out to them via [email or web address]. If 7PM-7AM, please contact night-coverage If you still have difficulty reaching the attending provider, please page the [facility] (Director on Call) for [facility] on amion for assistance.

## 2020-01-24 NOTE — Progress Notes (Signed)
Patient bilateral legs are restless and takes Neurotin on home medications which haven't been restarted.  Will ask first shift to request from AM MD.

## 2020-01-24 NOTE — Progress Notes (Signed)
Morphine 2 mg IV given for air hunger per provider order.

## 2020-01-24 NOTE — Progress Notes (Signed)
Husband updated at bedside.  Questions answered.

## 2020-01-24 NOTE — Progress Notes (Signed)
Patient alert to self but very impulsive and attempting to get out of bed.  Patient has pulled green mitts off twice since 7 Am.  Patient with noted increase work of breathing and inspiratory and expiratory wheezes.  Patient has pulled BiPAP mask off and says she is hot.  Dr. Mortimer Fries notified.

## 2020-01-24 NOTE — Progress Notes (Signed)
ABG obtained

## 2020-01-24 NOTE — Procedures (Signed)
Endotracheal Intubation: Patient required placement of an artificial airway secondary to Respiratory Failure  Consent: Emergent. Discussed with Husband   Hand washing performed prior to starting the procedure.   Medications administered for sedation prior to procedure:  Etomidate 20  mg IV,  ROCuronium 20 mg IV, Fentanyl 100 mcg IV.    A time out procedure was called and correct patient, name, & ID confirmed. Needed supplies and equipment were assembled and checked to include ETT, 10 ml syringe, Glidescope, Mac and Miller blades, suction, oxygen and bag mask valve, end tidal CO2 monitor.   Patient was positioned to align the mouth and pharynx to facilitate visualization of the glottis.   Heart rate, SpO2 and blood pressure was continuously monitored during the procedure. Pre-oxygenation was conducted prior to intubation and endotracheal tube was placed through the vocal cords into the trachea.     The artificial airway was placed under direct visualization via glidescope route using a 8.5  ETT on the first attempt.  ETT was secured at 23 cm mark.  Placement was confirmed by auscuitation of lungs with good breath sounds bilaterally and no stomach sounds.  Condensation was noted on endotracheal tube.   Pulse ox 98%.  CO2 detector in place with appropriate color change.   Complications: None .   Operator: Rolanda Campa.   Chest radiograph ordered and pending.    Corrin Parker, M.D.  [facility]  Medical Director [place] Director [facility] Cardio-Pulmonary Department

## 2020-01-24 NOTE — Progress Notes (Signed)
BiPAP removed briefly for mouth care.  Patient stated "thank you".  02 SATs dropped to 83%.  BiPAP reapplied.  002 SATs back to normal after 5 minutes.

## 2020-01-24 NOTE — Progress Notes (Signed)
Patient with no improvement on BiPAP and ABG results abnormal.  Dr. Mortimer Fries talking with husband [name] about intubation.

## 2020-01-24 NOTE — Progress Notes (Signed)
Patient intubated by Darel Hong, NP with 8.5 ETT, 23cm at lip.  Condensation noted in ETT, positive color change on C02 detector, equal bilateral breath sounds.  ETT secured by Trixie Deis, CPT.

## 2020-01-24 NOTE — Progress Notes (Signed)
Darel Hong, NP at bedside to assess patient.  Patient 02 SATs have decreased to 90% on same BiPAP settings.  Increased work of breathing noted.  Patient continues to be anxious and agitated attempting to get out of bed.  When asked patient about breathing she states "I'm tired".

## 2020-01-25 DIAGNOSIS — J441 Chronic obstructive pulmonary disease with (acute) exacerbation: Secondary | ICD-10-CM | POA: Diagnosis not present

## 2020-01-25 DIAGNOSIS — J9622 Acute and chronic respiratory failure with hypercapnia: Secondary | ICD-10-CM | POA: Diagnosis not present

## 2020-01-25 DIAGNOSIS — I5033 Acute on chronic diastolic (congestive) heart failure: Secondary | ICD-10-CM | POA: Diagnosis not present

## 2020-01-25 DIAGNOSIS — J9621 Acute and chronic respiratory failure with hypoxia: Secondary | ICD-10-CM | POA: Diagnosis not present

## 2020-01-25 LAB — CBC
HCT: 42 % (ref 36.0–46.0)
Hemoglobin: 12.2 g/dL (ref 12.0–15.0)
MCH: 29.9 pg (ref 26.0–34.0)
MCHC: 29 g/dL — ABNORMAL LOW (ref 30.0–36.0)
MCV: 102.9 fL — ABNORMAL HIGH (ref 80.0–100.0)
Platelets: 258 10*3/uL (ref 150–400)
RBC: 4.08 MIL/uL (ref 3.87–5.11)
RDW: 11.7 % (ref 11.5–15.5)
WBC: 10.6 10*3/uL — ABNORMAL HIGH (ref 4.0–10.5)
nRBC: 0 % (ref 0.0–0.2)

## 2020-01-25 LAB — GLUCOSE, CAPILLARY
Glucose-Capillary: 100 mg/dL — ABNORMAL HIGH (ref 70–99)
Glucose-Capillary: 96 mg/dL (ref 70–99)
Glucose-Capillary: 91 mg/dL (ref 70–99)
Glucose-Capillary: 101 mg/dL — ABNORMAL HIGH (ref 70–99)
Glucose-Capillary: 122 mg/dL — ABNORMAL HIGH (ref 70–99)

## 2020-01-25 LAB — BASIC METABOLIC PANEL
Anion gap: 8 (ref 5–15)
BUN: 38 mg/dL — ABNORMAL HIGH (ref 6–20)
CO2: 45 mmol/L — ABNORMAL HIGH (ref 22–32)
Calcium: 8.9 mg/dL (ref 8.9–10.3)
Chloride: 85 mmol/L — ABNORMAL LOW (ref 98–111)
Creatinine, Ser: 0.68 mg/dL (ref 0.44–1.00)
GFR, Estimated: >60 mL/min (ref >60)
Glucose, Bld: 134 mg/dL — ABNORMAL HIGH (ref 70–99)
Potassium: 3.7 mmol/L (ref 3.5–5.1)
Sodium: 138 mmol/L (ref 135–145)

## 2020-01-25 LAB — TRIGLYCERIDES: Triglycerides: 213 mg/dL — ABNORMAL HIGH (ref <150)

## 2020-01-25 LAB — MAGNESIUM: Magnesium: 2.6 mg/dL — ABNORMAL HIGH (ref 1.7–2.4)

## 2020-01-25 LAB — PHOSPHORUS: Phosphorus: 4.4 mg/dL (ref 2.5–4.6)

## 2020-01-25 MED ORDER — ADULT MULTIVITAMIN LIQUID CH
15.0000 mL | Freq: Every day | ORAL | Status: DC
  Administered 2020-01-26 – 2020-01-31 (×6): 15 mL
  Filled 2020-01-25 (×7): qty 15

## 2020-01-25 MED ORDER — PROSOURCE TF PO LIQD
45.0000 mL | Freq: Every day | ORAL | Status: DC
  Administered 2020-01-26 – 2020-01-31 (×6): 45 mL
  Filled 2020-01-25 (×3): qty 45

## 2020-01-25 MED ORDER — VITAL HIGH PROTEIN PO LIQD
1000.0000 mL | ORAL | Status: DC
  Administered 2020-01-25 – 2020-01-31 (×7): 1000 mL

## 2020-01-25 MED ORDER — SODIUM CHLORIDE 0.9 % IV SOLN
INTRAVENOUS | Status: DC | PRN
  Administered 2020-01-25 – 2020-01-30 (×4): 250 mL via INTRAVENOUS

## 2020-01-25 NOTE — Progress Notes (Signed)
Initial Nutrition Assessment  DOCUMENTATION CODES:   Obesity unspecified  INTERVENTION:   Vital HP @50ml /hr + ProSource 65ml daily via tube   Propofol: 16.6 ml/hr- provides 438kcal/day   Free water flushes 10ml q4 hours to maintain tube patency   Regimen provides 1240kcal/day, 116g/day protein and 1169ml/day free water. With propofol provides 1678kcal/day.   Liquid MVI daily via tube   NUTRITION DIAGNOSIS:   Inadequate oral intake related to inability to eat (pt sedated and ventilated) as evidenced by NPO status.  GOAL:   Provide needs based on ASPEN/SCCM guidelines  MONITOR:   Vent status,Labs,Weight trends,Skin,I & O's,TF tolerance  REASON FOR ASSESSMENT:   Ventilator    ASSESSMENT:   50 y.o. female with medical history significant for diverticulitis s/p colostomy, substance abuse, chronic hypoxic hypercapnic respiratory failure on 3.5 L continuous nasal cannula, severe COPD, chronic diastolic heart failure, chronic tobacco abuse and generalized anxiety disorder who is admitted to [facility] on 01/22/2020 with acute on chronic hypoxic hypercapnic respiratory failure in the setting of acute COPD exacerbation and acute on chronic diastolic heart failure  Pt sedated and ventilated. OGT in place. Plan is to start tube feeds today. Suspect pt with good appetite and oral intake pta. Per chart, pt with weight gain pta.   Medications reviewed and include: colace, lovenox, solu-medrol, MVI, miralax, azithromycin, ceftriaxone, pepcid, fentanyl, levophed, propofol  Labs reviewed: K 3.7 wnl, BUN 38(H), P 4.4 wnl, Mg 2.6(H) Wbc- 10.6(H)  Patient is currently intubated on ventilator support MV: 5.0 L/min Temp (24hrs), Avg:98.4 F (36.9 C), Min:98.1 F (36.7 C), Max:98.7 F (37.1 C)  Propofol: 16.6 ml/hr- provides 438kcal/day   MAP- >25mmHg   UOP- 1344ml   NUTRITION - FOCUSED PHYSICAL EXAM:  Flowsheet Row Most Recent Value  Orbital Region No  depletion  Upper Arm Region No depletion  Thoracic and Lumbar Region No depletion  Buccal Region No depletion  Temple Region No depletion  Clavicle Bone Region No depletion  Clavicle and Acromion Bone Region No depletion  Scapular Bone Region No depletion  Dorsal Hand No depletion  Patellar Region No depletion  Anterior Thigh Region No depletion  Posterior Calf Region No depletion  Edema (RD Assessment) Mild  Hair Reviewed  Eyes Reviewed  Mouth Reviewed  Skin Reviewed  Nails Reviewed     Diet Order:   Diet Order            Diet NPO time specified  Diet effective now                EDUCATION NEEDS:   No education needs have been identified at this time  Skin:  Skin Assessment: Reviewed RN Assessment (ecchymosis)  Last BM:  1/7- 20ml via ostomy  Height:   Ht Readings from Last 1 Encounters:  01/22/20 5\' 4"  (1.626 m)    Weight:   Wt Readings from Last 1 Encounters:  01/25/20 95.1 kg    Ideal Body Weight:  54.5 kg  BMI:  Body mass index is 35.99 kg/m.  Estimated Nutritional Needs:   Kcal:  1046-1330kcal/day  Protein:  >109g/day  Fluid:  1.6L/day  Koleen Distance MS, RD, LDN Please refer to [facility] for RD and/or RD on-call/weekend/after hours pager

## 2020-01-25 NOTE — Progress Notes (Signed)
[facility] Progress Note  Patient: Misty Green    RAQ:[id number]  DOA: 01/22/2020     Date of Service: the patient was seen and examined on 01/25/2020  Chief Complaint  Patient presents with  . Shortness of Breath   Brief hospital course: Misty Green is a 50 y.o. female with medical history significant for chronic hypoxic hypercapnic respiratory failure on 3.5 L continuous nasal cannula, severe COPD, chronic diastolic heart failure, chronic tobacco abuse, generalized anxiety disorder, who is admitted to [facility] on 01/22/2020 with acute on chronic hypoxic hypercapnic respiratory failure in the setting of acute COPD exacerbation acute on chronic diastolic heart failure after presenting from home to [facility] Emergency Department complaining of shortness of breath.     Assessment and Plan:  Principal Problem:   Acute on chronic respiratory failure with hypoxia and hypercapnia (HCC) Active Problems:   COPD exacerbation (HCC)   Anxiety   Tobacco user   Chronic obstructive pulmonary disease (HCC)   Acute on chronic diastolic CHF (congestive heart failure) (HCC)    #) Acute on chronic hypoxic hypercapnic respiratory failure due to COPD exacerbation And H/o chronic diastolic heart failure,  baseline supplemental oxygen requirement of continuous 3.5 L nasal cannula Sob appears to be multifactorial in nature, with suspected initial contribution from acute COPD exacerbation, with ensuing development of acute on chronic diastolic heart failure, with suspected decompensation felt to be on the basis of stress due to initial copd exacerbation.   no evidence of pneumonia and COVID-19/influenza PCR were found to be negative Currently on BiPAP for treatment of acute on chronic hypercapnic element as well as for patient comfort in the setting of concomitant acutely decompensated heart failure.    Plan:  DuoNeb scheduled every 6 hours while awake.   As needed  albuterol nebulizer.   Solu-Medrol 40 mg IV every 6 hours.   Monitor on telemetry.  Monitor continuous pulse oximetry.    monitor phosphorus and serum magnesium level.   Continue home Daliresp and Singulair.  Check ABG prn 1/6 patient was transferred in the ICU due to continuous need of BiPAP and ended up getting intubated due to anxiety and respiratory distress Pulmonary critical care help appreciated. 1/7 still intubated and is being managed by PCCM  #) Acute on chronic diastolic heart failure: chronic diastolic heart failure, with most recent echocardiogram in March 2021,   2 to 3 days of progressive shortness of breath associated with orthopnea as well as evidence of acutely decompensated heart failure on chest x-ray, as further described above.  Suspect contribution from stress relating to preceding acute COPD exacerbation, as above.  Patient reports good compliance with her home diuretic regimen consisting of Lasix 20 mg p.o. twice daily.  Presentation not associated any chest pain, and ACS is felt to be less likely in the absence of any acute ischemic changes on presenting EKG, while high-sensitivity troponin I was found to be nonelevated, and even after 48 to 72 hours of ongoing shortness of breath.  She received Lasix 40 mg IV x1 in the ED this evening.  s/p Lasix 40 mg IV twice daily.   Plan: monitor strict I's & O's and daily weights.  Monitor on telemetry, including trend in HR in response to diuresis,  Monitor continuous pulse oximetry.  Repeat BMP in the morning, including for monitoring of trend of potassium, bicarbonate, and renal function in response to interval diuresis efforts.  Check serum magnesium level.  Will reassess volume status  in the morning and clinically correlate to help guide additional diuresis efforts.  Close monitoring of ensuing blood pressure with diuresis efforts, particular in the setting of current BiPAP use, with associated monitoring for development of  hypotension, as well as to assess adequacy of afterload reduction.   Consider repeat echocardiogram once more euvolemic as it is almost been 1 year since her most recent prior with interval acute exacerbation.  1/5 Breo Ellipta inhaler    #) Chronic tobacco abuse: The patient knowledges that she is a current smoker, having smoked approximately half pack per day over the last 15 years, but acknowledges slight decline in her daily smoking habits down to approximately 1/4 pack/day.   Plan: Counseled the patient on the importance of complete discontinuation of smoking, particular given her history of severe COPD as well as chronic heart failure.  As needed nicotine patch has been ordered for use during this hospitalization.     #) Generalized anxiety disorder: On Lexapro as well as as needed Xanax at home.  Plan: Continue home Lexapro.  Will hold home as needed Xanax for now.  Instead, ordered as needed IV Ativan for now. Started Atarax   Body mass index is 36.9 kg/m.  Interventions:      Diet: Heart healthy  DVT Prophylaxis: Subcutaneous Lovenox   Advance goals of care discussion: Full code  Family Communication: family was not present at bedside, at the time of interview.  The pt provided permission to discuss medical plan with the family. Opportunity was given to ask question and all questions were answered satisfactorily.   Disposition:  Pt is from Home, admitted with respiratory failure, still has respiratory failure, which precludes a safe discharge. Discharge to home, when respiratory failure improves, after extubation.  1/7 Still patient is intubated   Subjective: No significant overnight events given patient remained intubated and sedated, unable to offer any complaints. Seems to be resting comfortably.  Physical Exam: General: Sedated  Eyes: closed ENT: Intubated Neck: no JVD,  Cardiovascular: S1 and S2 Present, no Murmur,  Respiratory: Equal air entry  bilaterally, mild wheezing, intubated  Abdomen: Bowel Sound present, Soft and no tenderness,  Skin: no rashes Extremities: no Pedal edema, no calf tenderness Neurologic: without any new focal findings, sedated Gait not checked due to patient safety concerns  Vitals:   01/25/20 1000 01/25/20 1100 01/25/20 1200 01/25/20 1300  BP: 105/70 122/73 111/61 106/61  Pulse: 80 74 78 79  Resp: _0 Temp:   98.6 F (37 C)   TempSrc:      SpO2: 95% 96% 97% 96%  Weight:      Height:        Intake/Output Summary (Last 24 hours) at 01/25/2020 1417 Last data filed at 01/25/2020 1300 Gross per 24 hour  Intake 781.36 ml  Output 1180 ml  Net -398.64 ml   Filed Weights   01/22/20 1347 01/24/20 0250 01/25/20 0500  Weight: 97.5 kg 92.2 kg 95.1 kg    Data Reviewed: I have personally reviewed and interpreted daily labs, tele strips, imagings as discussed above. I reviewed all nursing notes, pharmacy notes, vitals, pertinent old records I have discussed plan of care as described above with RN and patient/family.  CBC: Recent Labs  Lab 01/22/20 1340 01/23/20 0830 01/24/20 0536 01/25/20 0530  WBC 8.1 6.7 10.6* 10.6*  NEUTROABS 6.3  --   --   --   HGB 13.6 13.3 13.2 12.2  HCT 45.6 44.8 45.1 42.0  MCV 101.8* 101.4* 101.6* 102.9*  PLT 225 259 276 361   Basic Metabolic Panel: Recent Labs  Lab 01/22/20 1340 01/23/20 0830 01/24/20 0536 01/24/20 2255 01/25/20 0530  NA 141 142 145 140 138  K 4.4 3.9 3.1* 4.2 3.7  CL 86* 81* 78* 83* 85*  CO2 41* 49* >50* 46* 45*  GLUCOSE 140* 119* 119* 143* 134*  BUN 15 17 28* 39* 38*  CREATININE 0.52 0.47 0.49 0.78 0.68  CALCIUM 9.6 10.2 10.1 8.9 8.9  MG  --  2.6* 2.4  --  2.6*  PHOS  --  3.1 4.3  --  4.4    Studies: DG Abd 1 View  Result Date: 01/24/2020 CLINICAL DATA:  Orogastric tube placement. EXAM: ABDOMEN - 1 VIEW COMPARISON:  January 06, 2018. FINDINGS: The bowel gas pattern is normal. Distal tip of nasogastric tube is seen in expected  position of distal stomach. No radio-opaque calculi or other significant radiographic abnormality are seen. IMPRESSION: Distal tip of nasogastric tube seen in expected position of distal stomach. Electronically Signed   By: Marijo Conception M.D.   On: 01/24/2020 15:04   DG Chest Port 1 View  Result Date: 01/24/2020 CLINICAL DATA:  Intubation. EXAM: PORTABLE CHEST 1 VIEW COMPARISON:  01/24/2020. FINDINGS: Endotracheal tube noted with tip 3 cm above the carina. NG tube noted with tip below left hemidiaphragm. Left IJ line noted with tip at cavoatrial junction. Heart size normal. Low lung volumes. Mild left base interstitial infiltrate cannot be excluded. Bilateral calcified pulmonary nodules consistent prior granulomas disease. No pleural effusion or pneumothorax. IMPRESSION: 1. Lines and tubes in good anatomic position. 2. Low lung volumes. Mild left base interstitial infiltrate cannot be excluded. Electronically Signed   By: Marcello Moores  Register   On: 01/24/2020 15:05    Scheduled Meds: . acetaZOLAMIDE  500 mg Intravenous Q12H  . budesonide (PULMICORT) nebulizer solution  0.5 mg Nebulization BID  . chlorhexidine gluconate (MEDLINE KIT)  15 mL Mouth Rinse BID  . Chlorhexidine Gluconate Cloth  6 each Topical Q0600  . docusate  100 mg Per Tube BID  . enoxaparin (LOVENOX) injection  0.5 mg/kg Subcutaneous Q24H  . escitalopram  20 mg Per Tube Daily  . [START ON 01/26/2020] feeding supplement (PROSource TF)  45 mL Per Tube Daily  . fluticasone  1 spray Each Nare QHS  . hydrOXYzine  25 mg Per Tube TID  . ipratropium-albuterol  3 mL Nebulization Q6H  . mouth rinse  15 mL Mouth Rinse 10 times per day  . methylPREDNISolone (SOLU-MEDROL) injection  40 mg Intravenous Q6H  . montelukast  10 mg Per Tube QHS  . [START ON 01/26/2020] multivitamin  15 mL Per Tube Daily  . polyethylene glycol  17 g Per Tube Daily  . QUEtiapine  50 mg Per Tube QHS  . roflumilast  500 mcg Per Tube Daily   Continuous Infusions: .  albuterol 5 mg/hr (01/24/20 0837)  . azithromycin 500 mg (01/25/20 1226)  . cefTRIAXone (ROCEPHIN)  IV 1 g (01/25/20 1033)  . dexmedetomidine (PRECEDEX) IV infusion Stopped (01/24/20 1355)  . famotidine (PEPCID) IV 20 mg (01/25/20 1110)  . feeding supplement (VITAL HIGH PROTEIN) 1,000 mL (01/25/20 1258)  . fentaNYL infusion INTRAVENOUS 200 mcg/hr (01/25/20 0114)  . norepinephrine (LEVOPHED) Adult infusion Stopped (01/25/20 0700)  . propofol (DIPRIVAN) infusion 30 mcg/kg/min (01/25/20 1229)   PRN Meds: acetaminophen **OR** acetaminophen, albuterol, fentaNYL, guaiFENesin-codeine, LORazepam, morphine injection, nicotine, ondansetron (ZOFRAN) IV  Time spent: 35 minutes  Author: Val Riles. MD [facility] 01/25/2020 2:17 PM  To reach On-call, see care teams to locate the attending and reach out to them via [email or web address]. If 7PM-7AM, please contact night-coverage If you still have difficulty reaching the attending provider, please page the [facility] (Director on Call) for [facility] on amion for assistance.

## 2020-01-25 NOTE — Progress Notes (Signed)
CRITICAL CARE NOTE  50 y.o. Female admitted 01/22/20 with Acute on Chronic Hypoxic Hypercapnic Respiratory Failure in the setting of Acute COPD Exacerbation & Acute on Chronic HFpEF requiring BiPAP.   Significant Hospital Events:Hospital Course:  1/4: Admission to Stepdown 1/6: Increased work of breathing on BiPAP, PCCM consulted, high risk for intubation 01/24/20 in the Stepdown unit, she is noted to have respiratory distress and increased work of breathing in BiPAP.  PCCM is consulted for assistance in management.  She is high risk for intubation. 1/6 intubated On MV support, 8.5 ETT placed Left CVL placed 1/7 remains on vent, severe COPD   Significant Diagnostic Tests:  1/4: CXR>>Cardiomediastinal silhouette unchanged in size and contour. Fullness in the central vasculature. Interlobular septal thickening with reticular opacities. No pneumothorax or large pleural effusion.No confluent airspace disease. Concerning for Acute CHF  CC  follow up respiratory failure  SUBJECTIVE Patient remains critically ill Prognosis is guarded Severe COPD    BP 125/71   Pulse 74   Temp 98.3 F (36.8 C) (Oral)   Resp (!) 24   Ht 5\' 4"  (1.626 m)   Wt 95.1 kg   SpO2 97%   BMI 35.99 kg/m    I/O last 3 completed shifts: In: 1555.5 [I.V.:783.8; IV Piggyback:771.7] Out: 9622 [Urine:1500; Stool:30] No intake/output data recorded.  SpO2: 97 % O2 Flow Rate (L/min): 4 L/min FiO2 (%): 45 %  Estimated body mass index is 35.99 kg/m as calculated from the following:   Height as of this encounter: 5\' 4"  (1.626 m).   Weight as of this encounter: 95.1 kg.  SIGNIFICANT EVENTS   REVIEW OF SYSTEMS  PATIENT IS UNABLE TO PROVIDE COMPLETE REVIEW OF SYSTEMS DUE TO SEVERE CRITICAL ILLNESS        PHYSICAL EXAMINATION:  GENERAL:critically ill appearing, +resp distress HEAD: Normocephalic, atraumatic.  EYES: Pupils equal, round, reactive to light.  No scleral icterus.  MOUTH: Moist mucosal  membrane. NECK: Supple.  PULMONARY: +rhonchi, +wheezing CARDIOVASCULAR: S1 and S2. Regular rate and rhythm. No murmurs, rubs, or gallops.  GASTROINTESTINAL: Soft, nontender, -distended.  Positive bowel sounds.   MUSCULOSKELETAL: No swelling, clubbing, or edema.  NEUROLOGIC: obtunded, GCS<8 SKIN:intact,warm,dry  MEDICATIONS: I have reviewed all medications and confirmed regimen as documented   CULTURE RESULTS   Recent Results (from the past 240 hour(s))  Resp Panel by RT-PCR (Flu A&B, Covid) Nasopharyngeal Swab     Status: None   Collection Time: 01/22/20  1:40 PM   Specimen: Nasopharyngeal Swab; Nasopharyngeal(NP) swabs in vial transport medium  Result Value Ref Range Status   SARS Coronavirus 2 by RT PCR NEGATIVE NEGATIVE Final    Comment: (NOTE) SARS-CoV-2 target nucleic acids are NOT DETECTED.  The SARS-CoV-2 RNA is generally detectable in upper respiratory specimens during the acute phase of infection. The lowest concentration of SARS-CoV-2 viral copies this assay can detect is 138 copies/mL. A negative result does not preclude SARS-Cov-2 infection and should not be used as the sole basis for treatment or other patient management decisions. A negative result may occur with  improper specimen collection/handling, submission of specimen other than nasopharyngeal swab, presence of viral mutation(s) within the areas targeted by this assay, and inadequate number of viral copies(<138 copies/mL). A negative result must be combined with clinical observations, patient history, and epidemiological information. The expected result is Negative.  Fact Sheet for Patients:  EntrepreneurPulse.com.au  Fact Sheet for Healthcare Providers:  IncredibleEmployment.be  This test is no t yet approved or cleared by the [place]  FDA and  has been authorized for detection and/or diagnosis of SARS-CoV-2 by FDA under an Emergency Use Authorization (EUA).  This EUA will remain  in effect (meaning this test can be used) for the duration of the COVID-19 declaration under Section 564(b)(1) of the Act, 21 U.S.C.section 360bbb-3(b)(1), unless the authorization is terminated  or revoked sooner.       Influenza A by PCR NEGATIVE NEGATIVE Final   Influenza B by PCR NEGATIVE NEGATIVE Final    Comment: (NOTE) The Xpert Xpress SARS-CoV-2/FLU/RSV plus assay is intended as an aid in the diagnosis of influenza from Nasopharyngeal swab specimens and should not be used as a sole basis for treatment. Nasal washings and aspirates are unacceptable for Xpert Xpress SARS-CoV-2/FLU/RSV testing.  Fact Sheet for Patients: EntrepreneurPulse.com.au  Fact Sheet for Healthcare Providers: IncredibleEmployment.be  This test is not yet approved or cleared by the [place] FDA and has been authorized for detection and/or diagnosis of SARS-CoV-2 by FDA under an Emergency Use Authorization (EUA). This EUA will remain in effect (meaning this test can be used) for the duration of the COVID-19 declaration under Section 564(b)(1) of the Act, 21 U.S.C. section 360bbb-3(b)(1), unless the authorization is terminated or revoked.  Performed at [facility], [address] [place], [place] [address]   MRSA PCR Screening     Status: None   Collection Time: 01/24/20  2:45 AM   Specimen: Nasal Mucosa; Nasopharyngeal  Result Value Ref Range Status   MRSA by PCR NEGATIVE NEGATIVE Final    Comment:        The GeneXpert MRSA Assay (FDA approved for NASAL specimens only), is one component of a comprehensive MRSA colonization surveillance program. It is not intended to diagnose MRSA infection nor to guide or monitor treatment for MRSA infections. Performed at [facility], [address] [place], [place] [address]           IMAGING    DG Abd 1 View  Result Date: 01/24/2020 CLINICAL DATA:  Orogastric  tube placement. EXAM: ABDOMEN - 1 VIEW COMPARISON:  January 06, 2018. FINDINGS: The bowel gas pattern is normal. Distal tip of nasogastric tube is seen in expected position of distal stomach. No radio-opaque calculi or other significant radiographic abnormality are seen. IMPRESSION: Distal tip of nasogastric tube seen in expected position of distal stomach. Electronically Signed   By: Marijo Conception M.D.   On: 01/24/2020 15:04   DG Chest Port 1 View  Result Date: 01/24/2020 CLINICAL DATA:  Intubation. EXAM: PORTABLE CHEST 1 VIEW COMPARISON:  01/24/2020. FINDINGS: Endotracheal tube noted with tip 3 cm above the carina. NG tube noted with tip below left hemidiaphragm. Left IJ line noted with tip at cavoatrial junction. Heart size normal. Low lung volumes. Mild left base interstitial infiltrate cannot be excluded. Bilateral calcified pulmonary nodules consistent prior granulomas disease. No pleural effusion or pneumothorax. IMPRESSION: 1. Lines and tubes in good anatomic position. 2. Low lung volumes. Mild left base interstitial infiltrate cannot be excluded. Electronically Signed   By: Marcello Moores  Register   On: 01/24/2020 15:05   DG Chest Port 1 View  Result Date: 01/24/2020 CLINICAL DATA:  Acute respiratory distress with shortness of breath. EXAM: PORTABLE CHEST 1 VIEW COMPARISON:  January 22, 2020. FINDINGS: Stable cardiomediastinal silhouette with mild central pulmonary vascular congestion. No pneumothorax or pleural effusion is noted. Stable calcified granulomata are noted bilaterally. No consolidative process is noted. The visualized skeletal structures are unremarkable. IMPRESSION: Mild central pulmonary vascular  congestion. Electronically Signed   By: Marijo Conception M.D.   On: 01/24/2020 09:03   [place] EKG SITE RITE  Result Date: 01/24/2020 If Site Rite image not attached, placement could not be confirmed due to current cardiac rhythm.    Nutrition Status:       Vent Mode: PRVC FiO2 (%):  [40 %-60  %] 45 % Set Rate:  [12 bmp-22 bmp] 12 bmp Vt Set:  [400 mL-500 mL] 400 mL PEEP:  [5 cmH20] 5 cmH20 BMP Latest Ref Rng & Units 01/25/2020 01/24/2020 01/24/2020  Glucose 70 - 99 mg/dL 134(H) 143(H) 119(H)  BUN 6 - 20 mg/dL 38(H) 39(H) 28(H)  Creatinine 0.44 - 1.00 mg/dL 0.68 0.78 0.49  Sodium 135 - 145 mmol/L 138 140 145  Potassium 3.5 - 5.1 mmol/L 3.7 4.2 3.1(L)  Chloride 98 - 111 mmol/L 85(L) 83(L) 78(L)  CO2 22 - 32 mmol/L 45(H) 46(H) >50(H)  Calcium 8.9 - 10.3 mg/dL 8.9 8.9 10.1       Indwelling Urinary Catheter continued, requirement due to   Reason to continue Indwelling Urinary Catheter strict Intake/Output monitoring for hemodynamic instability   Central Line/ continued, requirement due to  Reason to continue Baird of central venous pressure or other hemodynamic parameters and poor IV access   Ventilator continued, requirement due to severe respiratory failure   Ventilator Sedation RASS 0 to -2      ASSESSMENT AND PLAN SYNOPSIS  50 yo white female with end stage COPD with progressive resp failure failed biPAP +Cannibus abuse with severe hypoxic and hypercapnic resp failure and severe COPD exacerbation plan for therapy for CAP  Severe ACUTE Hypoxic and Hypercapnic Respiratory Failure -continue Full MV support -continue Bronchodilator Therapy -Wean Fio2 and PEEP as tolerated -VAP/VENT bundle implementation  ACUTE DIASTOLIC CARDIAC FAILURE-  -oxygen as needed -Lasix as tolerated Diamox therapy    obesity, possible OSA.   Will certainly impact respiratory mechanics, ventilator weaning Suspect will need to consider additional PEEP   NEUROLOGY - intubated and sedated - minimal sedation to achieve a RASS goal: -1 Wake up assessment pending    CARDIAC ICU monitoring  ID Therapy for CAP -continue IV abx as prescibed -follow up cultures  GI GI PROPHYLAXIS as indicated  NUTRITIONAL STATUS Nutrition Status:         DIET-->TF's as  tolerated Constipation protocol as indicated  ENDO - will use ICU hypoglycemic\Hyperglycemia protocol if indicated     ELECTROLYTES -follow labs as needed -replace as needed -pharmacy consultation and following   DVT/GI PRX ordered and assessed TRANSFUSIONS AS NEEDED MONITOR FSBS I Assessed the need for Labs I Assessed the need for Foley I Assessed the need for Central Venous Line Family Discussion when available I Assessed the need for Mobilization I made an Assessment of medications to be adjusted accordingly Safety Risk assessment completed   CASE DISCUSSED IN MULTIDISCIPLINARY ROUNDS WITH ICU TEAM  Critical Care Time devoted to patient care services described in this note is 54 minutes.   Overall, patient is critically ill, prognosis is guarded.  Patient with Multiorgan failure and at high risk for cardiac arrest and death.    Corrin Parker, M.D.  [facility]  Medical Director [place] Director [facility] Cardio-Pulmonary Department

## 2020-01-25 NOTE — Progress Notes (Signed)
Failed spontaneous breathing trial. Otherwise sedated and ventilated. Remains in NSR. Output fair. Started on tube feeding. Colostomy working well.

## 2020-01-25 NOTE — Plan of Care (Signed)
Patient remains intubated and sedated. Off of vasopressors.   Problem: Education: Goal: Knowledge of General Education information will improve Description: Including pain rating scale, medication(s)/side effects and non-pharmacologic comfort measures Outcome: Not Progressing   Problem: Activity: Goal: Risk for activity intolerance will decrease Outcome: Not Progressing   Problem: Pain Managment: Goal: General experience of comfort will improve Outcome: Not Progressing   Problem: Safety: Goal: Ability to remain free from injury will improve Outcome: Not Progressing   Problem: Skin Integrity: Goal: Risk for impaired skin integrity will decrease Outcome: Not Progressing   Problem: Health Behavior/Discharge Planning: Goal: Ability to manage health-related needs will improve Outcome: Not Progressing

## 2020-01-26 ENCOUNTER — Inpatient Hospital Stay: Payer: 59

## 2020-01-26 DIAGNOSIS — J9622 Acute and chronic respiratory failure with hypercapnia: Secondary | ICD-10-CM | POA: Diagnosis not present

## 2020-01-26 DIAGNOSIS — J9621 Acute and chronic respiratory failure with hypoxia: Secondary | ICD-10-CM | POA: Diagnosis not present

## 2020-01-26 LAB — BASIC METABOLIC PANEL
Anion gap: 8 (ref 5–15)
BUN: 28 mg/dL — ABNORMAL HIGH (ref 6–20)
CO2: 43 mmol/L — ABNORMAL HIGH (ref 22–32)
Calcium: 8.9 mg/dL (ref 8.9–10.3)
Chloride: 89 mmol/L — ABNORMAL LOW (ref 98–111)
Creatinine, Ser: 0.41 mg/dL — ABNORMAL LOW (ref 0.44–1.00)
GFR, Estimated: >60 mL/min (ref >60)
Glucose, Bld: 143 mg/dL — ABNORMAL HIGH (ref 70–99)
Potassium: 3.9 mmol/L (ref 3.5–5.1)
Sodium: 140 mmol/L (ref 135–145)

## 2020-01-26 LAB — MAGNESIUM: Magnesium: 2.3 mg/dL (ref 1.7–2.4)

## 2020-01-26 LAB — CBC
HCT: 37.5 % (ref 36.0–46.0)
Hemoglobin: 10.8 g/dL — ABNORMAL LOW (ref 12.0–15.0)
MCH: 30.2 pg (ref 26.0–34.0)
MCHC: 28.8 g/dL — ABNORMAL LOW (ref 30.0–36.0)
MCV: 104.7 fL — ABNORMAL HIGH (ref 80.0–100.0)
Platelets: 187 10*3/uL (ref 150–400)
RBC: 3.58 MIL/uL — ABNORMAL LOW (ref 3.87–5.11)
RDW: 11.5 % (ref 11.5–15.5)
WBC: 7.1 10*3/uL (ref 4.0–10.5)
nRBC: 0 % (ref 0.0–0.2)

## 2020-01-26 LAB — GLUCOSE, CAPILLARY
Glucose-Capillary: 114 mg/dL — ABNORMAL HIGH (ref 70–99)
Glucose-Capillary: 129 mg/dL — ABNORMAL HIGH (ref 70–99)
Glucose-Capillary: 209 mg/dL — ABNORMAL HIGH (ref 70–99)
Glucose-Capillary: 145 mg/dL — ABNORMAL HIGH (ref 70–99)
Glucose-Capillary: 130 mg/dL — ABNORMAL HIGH (ref 70–99)

## 2020-01-26 LAB — TRIGLYCERIDES: Triglycerides: 173 mg/dL — ABNORMAL HIGH (ref <150)

## 2020-01-26 LAB — PHOSPHORUS: Phosphorus: 3.7 mg/dL (ref 2.5–4.6)

## 2020-01-26 MED ORDER — INSULIN ASPART 100 UNIT/ML ~~LOC~~ SOLN
0.0000 [IU] | SUBCUTANEOUS | Status: DC
  Administered 2020-01-26 – 2020-01-28 (×9): 2 [IU] via SUBCUTANEOUS
  Administered 2020-01-28 – 2020-01-29 (×2): 3 [IU] via SUBCUTANEOUS
  Administered 2020-01-29 – 2020-02-01 (×11): 2 [IU] via SUBCUTANEOUS
  Administered 2020-02-02: 3 [IU] via SUBCUTANEOUS
  Administered 2020-02-02: 2 [IU] via SUBCUTANEOUS
  Administered 2020-02-03: 3 [IU] via SUBCUTANEOUS
  Administered 2020-02-03: 2 [IU] via SUBCUTANEOUS
  Filled 2020-01-26 (×24): qty 1

## 2020-01-26 NOTE — Plan of Care (Signed)
Currently patient is intubated on ventilator and under ICU care so we will sign off.  Please consult [facility] hospitalist when patient is extubated and ready to be transferred under our service.  Thank you

## 2020-01-26 NOTE — Progress Notes (Addendum)
CRITICAL CARE NOTE  50 y.o. Female admitted 01/22/20 with Acute on Chronic Hypoxic Hypercapnic Respiratory Failure in the setting of Acute COPD Exacerbation & Acute on Chronic HFpEF requiring BiPAP.   Significant Hospital Events:Hospital Course:  1/4: Admission to Stepdown1/6: Increased work of breathing on BiPAP, PCCM consulted, high risk for intubation 01/24/20 in the Stepdown unit, she is noted to have respiratory distress and increased work of breathing in BiPAP.  PCCM is consulted for assistance in management.  She is high risk for intubation. 1/6 intubated On MV support, 8.5 ETT placed Left CVL placed 1/7 remains on vent, severe COPD 1/8 failed weaning trial   Significant Diagnostic Tests:  1/4: CXR>>Cardiomediastinal silhouette unchanged in size and contour. Fullness in the central vasculature. Interlobular septal thickening with reticular opacities. No pneumothorax or large pleural effusion.No confluent airspace disease. Concerning for Acute CHF  CC  follow up respiratory failure  SUBJECTIVE  Remains on the vent, critically ill.  Failed weaning trial due to agitation No acute events overnight   BP 128/65   Pulse 88   Temp 98.6 F (37 C) (Oral)   Resp 12   Ht 5\' 4"  (1.626 m)   Wt 95.7 kg   SpO2 95%   BMI 36.21 kg/m    I/O last 3 completed shifts: In: 2673.7 [I.V.:1568.1; Other:30; NG/GT:625.6; IV Piggyback:450] Out: 2280 [Urine:2220; Stool:60] Total I/O In: 192.7 [I.V.:141.3; IV Piggyback:51.4] Out: 150 [Urine:150]  SpO2: 95 % O2 Flow Rate (L/min): 4 L/min FiO2 (%): 35 %  Estimated body mass index is 36.21 kg/m as calculated from the following:   Height as of this encounter: 5\' 4"  (1.626 m).   Weight as of this encounter: 95.7 kg.  SIGNIFICANT EVENTS   REVIEW OF SYSTEMS  PATIENT IS UNABLE TO PROVIDE COMPLETE REVIEW OF SYSTEMS DUE TO SEVERE CRITICAL ILLNESS       PHYSICAL EXAMINATION: Gen:      No acute distress, critically ill-appearing HEENT:   EOMI, sclera anicteric Neck:     No masses; no thyromegaly, ET tube Lungs:    Clear to auscultation bilaterally; normal respiratory effort CV:         Regular rate and rhythm; no murmurs Abd:      + bowel sounds; soft, non-tender; no palpable masses, no distension Ext:    No edema; adequate peripheral perfusion Skin:      Warm and dry; no rash Neuro: Sedated, unresponsive  Labs/imaging reviewed Significant for BUN/creatinine 28/0.41, WBC 7.1, hemoglobin 10.8 platelets 187 Chest x-ray today with low lung volumes, atelectasis  MEDICATIONS: I have reviewed all medications and confirmed regimen as documented   CULTURE RESULTS   Recent Results (from the past 240 hour(s))  Resp Panel by RT-PCR (Flu A&B, Covid) Nasopharyngeal Swab     Status: None   Collection Time: 01/22/20  1:40 PM   Specimen: Nasopharyngeal Swab; Nasopharyngeal(NP) swabs in vial transport medium  Result Value Ref Range Status   SARS Coronavirus 2 by RT PCR NEGATIVE NEGATIVE Final    Comment: (NOTE) SARS-CoV-2 target nucleic acids are NOT DETECTED.  The SARS-CoV-2 RNA is generally detectable in upper respiratory specimens during the acute phase of infection. The lowest concentration of SARS-CoV-2 viral copies this assay can detect is 138 copies/mL. A negative result does not preclude SARS-Cov-2 infection and should not be used as the sole basis for treatment or other patient management decisions. A negative result may occur with  improper specimen collection/handling, submission of specimen other than nasopharyngeal swab, presence of viral  mutation(s) within the areas targeted by this assay, and inadequate number of viral copies(<138 copies/mL). A negative result must be combined with clinical observations, patient history, and epidemiological information. The expected result is Negative.  Fact Sheet for Patients:  EntrepreneurPulse.com.au  Fact Sheet for Healthcare Providers:   IncredibleEmployment.be  This test is no t yet approved or cleared by the [place] FDA and  has been authorized for detection and/or diagnosis of SARS-CoV-2 by FDA under an Emergency Use Authorization (EUA). This EUA will remain  in effect (meaning this test can be used) for the duration of the COVID-19 declaration under Section 564(b)(1) of the Act, 21 U.S.C.section 360bbb-3(b)(1), unless the authorization is terminated  or revoked sooner.       Influenza A by PCR NEGATIVE NEGATIVE Final   Influenza B by PCR NEGATIVE NEGATIVE Final    Comment: (NOTE) The Xpert Xpress SARS-CoV-2/FLU/RSV plus assay is intended as an aid in the diagnosis of influenza from Nasopharyngeal swab specimens and should not be used as a sole basis for treatment. Nasal washings and aspirates are unacceptable for Xpert Xpress SARS-CoV-2/FLU/RSV testing.  Fact Sheet for Patients: EntrepreneurPulse.com.au  Fact Sheet for Healthcare Providers: IncredibleEmployment.be  This test is not yet approved or cleared by the [place] FDA and has been authorized for detection and/or diagnosis of SARS-CoV-2 by FDA under an Emergency Use Authorization (EUA). This EUA will remain in effect (meaning this test can be used) for the duration of the COVID-19 declaration under Section 564(b)(1) of the Act, 21 U.S.C. section 360bbb-3(b)(1), unless the authorization is terminated or revoked.  Performed at [facility], [address] [place], [place] [address]   MRSA PCR Screening     Status: None   Collection Time: 01/24/20  2:45 AM   Specimen: Nasal Mucosa; Nasopharyngeal  Result Value Ref Range Status   MRSA by PCR NEGATIVE NEGATIVE Final    Comment:        The GeneXpert MRSA Assay (FDA approved for NASAL specimens only), is one component of a comprehensive MRSA colonization surveillance program. It is not intended to diagnose MRSA infection  nor to guide or monitor treatment for MRSA infections. Performed at [facility], [address] [place], [place] [address]           IMAGING    DG Chest Port 1 View  Result Date: 01/26/2020 CLINICAL DATA:  Acute respiratory failure EXAM: PORTABLE CHEST 1 VIEW COMPARISON:  Radiograph 01/24/2020 FINDINGS: Left IJ approach central venous catheter tip terminates near the superior cavoatrial junction. Transesophageal tube tip and side port terminate below the GE junction, beyond the margins of imaging. Endotracheal tube terminates in the mid trachea, 4.4 cm from the carina. Telemetry leads and external devices overlie the chest. Some persistently low volumes and atelectatic changes are present with some diffuse mild airways thickening and patchy opacities again seen in the left lung base. Some increasing opacity in the right lung base could reflect developing airspace disease or atelectasis. No pneumothorax. No effusion. Stable cardiomediastinal contours. No acute osseous or soft tissue abnormality. IMPRESSION: 1. Persistently low volumes and atelectasis albeit with some increasingly conspicuous patchy basilar opacities particularly on the right. Could reflect developing or worsening airspace disease and/or edema. 2. Lines and tubes as above. Electronically Signed   By: Lovena Le M.D.   On: 01/26/2020 02:55     Nutrition Status: Nutrition Problem: Inadequate oral intake Etiology: inability to eat (pt sedated and ventilated) Signs/Symptoms: NPO status   Vent  Mode: PRVC FiO2 (%):  [35 %-40 %] 35 % Set Rate:  [12 bmp] 12 bmp Vt Set:  [400 mL-450 mL] 400 mL PEEP:  [5 cmH20] 5 cmH20 Plateau Pressure:  [19 cmH20] 19 cmH20 BMP Latest Ref Rng & Units 01/26/2020 01/25/2020 01/24/2020  Glucose 70 - 99 mg/dL 143(H) 134(H) 143(H)  BUN 6 - 20 mg/dL 28(H) 38(H) 39(H)  Creatinine 0.44 - 1.00 mg/dL 0.41(L) 0.68 0.78  Sodium 135 - 145 mmol/L 140 138 140  Potassium 3.5 - 5.1 mmol/L 3.9 3.7 4.2   Chloride 98 - 111 mmol/L 89(L) 85(L) 83(L)  CO2 22 - 32 mmol/L 43(H) 45(H) 46(H)  Calcium 8.9 - 10.3 mg/dL 8.9 8.9 8.9       Indwelling Urinary Catheter continued, requirement due to   Reason to continue Indwelling Urinary Catheter strict Intake/Output monitoring for hemodynamic instability   Central Line/ continued, requirement due to  Reason to continue [employer] of central venous pressure or other hemodynamic parameters and poor IV access   Ventilator continued, requirement due to severe respiratory failure   Ventilator Sedation RASS 0 to -2      ASSESSMENT AND PLAN SYNOPSIS  50 yo white female with end stage COPD, asthma with progressive resp failure failed biPAP +Cannibus abuse with severe hypoxic and hypercapnic resp failure and severe COPD exacerbation plan for therapy for CAP  Severe ACUTE Hypoxic and Hypercapnic Respiratory Failure Continue vent support, PSV trials as tolerated Continue bronchodilator Trelegy Ceftriaxone, azithromycin for CAP coverage Daliresp, Singulair  ACUTE DIASTOLIC CARDIAC FAILURE-  Diamox, Lasix as tolerated    obesity, possible OSA.   Will certainly impact respiratory mechanics, ventilator weaning   NEUROLOGY Anxiety, depression - intubated and sedated - minimal sedation to achieve a RASS goal: -1 Wake up assessment pending Continue Lexapro, Seroquel   CARDIAC ICU monitoring  ID Therapy for CAP -continue IV abx as prescibed -follow up cultures  GI GI PROPHYLAXIS as indicated  NUTRITIONAL STATUS Nutrition Status: Nutrition Problem: Inadequate oral intake Etiology: inability to eat (pt sedated and ventilated) Signs/Symptoms: NPO status     DIET-->TF's as tolerated Constipation protocol as indicated  ENDO - will use ICU hypoglycemic\Hyperglycemia protocol if indicated     ELECTROLYTES -follow labs as needed -replace as needed -pharmacy consultation and following  Husband updated on  1/8  DVT/GI PRX ordered and assessed TRANSFUSIONS AS NEEDED MONITOR FSBS I Assessed the need for Labs I Assessed the need for Foley I Assessed the need for Central Venous Line Family Discussion when available I Assessed the need for Mobilization I made an Assessment of medications to be adjusted accordingly Safety Risk assessment completed   CASE DISCUSSED IN MULTIDISCIPLINARY ROUNDS WITH ICU TEAM  The patient is critically ill with multiple organ system failure and requires high complexity decision making for assessment and support, frequent evaluation and titration of therapies, advanced monitoring, review of radiographic studies and interpretation of complex data.   Critical Care Time devoted to patient care services, exclusive of separately billable procedures, described in this note is 45 minutes.   Marshell Garfinkel MD [facility] Please see [email or web address] for pager details.  01/26/2020, 10:34 AM

## 2020-01-26 NOTE — Plan of Care (Signed)
Pt remains sedated on ventilator. Sedation awakening done today, pt desat to 70s upon awakening. Was moving all four extremities and tracking but unable to follow commands. Husband visited this afternoon, full update provided. Daughter also updated over phone. Tube feeding at goal, pt tolerating well.   Problem: Safety: Goal: Ability to remain free from injury will improve Outcome: Progressing   Problem: Skin Integrity: Goal: Risk for impaired skin integrity will decrease Outcome: Progressing   Problem: Education: Goal: Knowledge of General Education information will improve Description: Including pain rating scale, medication(s)/side effects and non-pharmacologic comfort measures Outcome: Not Progressing   Problem: Activity: Goal: Risk for activity intolerance will decrease Outcome: Not Progressing   Problem: Pain Managment: Goal: General experience of comfort will improve Outcome: Not Progressing   Problem: Health Behavior/Discharge Planning: Goal: Ability to manage health-related needs will improve Outcome: Not Progressing

## 2020-01-26 NOTE — Plan of Care (Signed)
Patient remains intubated and sedated. Failing WUAs and SBTs due to hypoxia and agitation.     Problem: Education: Goal: Knowledge of General Education information will improve Description: Including pain rating scale, medication(s)/side effects and non-pharmacologic comfort measures Outcome: Not Progressing   Problem: Activity: Goal: Risk for activity intolerance will decrease Outcome: Not Progressing   Problem: Pain Managment: Goal: General experience of comfort will improve Outcome: Not Progressing   Problem: Safety: Goal: Ability to remain free from injury will improve Outcome: Not Progressing   Problem: Skin Integrity: Goal: Risk for impaired skin integrity will decrease Outcome: Not Progressing   Problem: Health Behavior/Discharge Planning: Goal: Ability to manage health-related needs will improve Outcome: Not Progressing

## 2020-01-27 ENCOUNTER — Inpatient Hospital Stay: Payer: 59

## 2020-01-27 DIAGNOSIS — J9622 Acute and chronic respiratory failure with hypercapnia: Secondary | ICD-10-CM | POA: Diagnosis not present

## 2020-01-27 DIAGNOSIS — J9621 Acute and chronic respiratory failure with hypoxia: Secondary | ICD-10-CM | POA: Diagnosis not present

## 2020-01-27 LAB — GLUCOSE, CAPILLARY
Glucose-Capillary: 101 mg/dL — ABNORMAL HIGH (ref 70–99)
Glucose-Capillary: 127 mg/dL — ABNORMAL HIGH (ref 70–99)
Glucose-Capillary: 128 mg/dL — ABNORMAL HIGH (ref 70–99)
Glucose-Capillary: 129 mg/dL — ABNORMAL HIGH (ref 70–99)
Glucose-Capillary: 130 mg/dL — ABNORMAL HIGH (ref 70–99)
Glucose-Capillary: 130 mg/dL — ABNORMAL HIGH (ref 70–99)
Glucose-Capillary: 132 mg/dL — ABNORMAL HIGH (ref 70–99)
Glucose-Capillary: 138 mg/dL — ABNORMAL HIGH (ref 70–99)

## 2020-01-27 LAB — BLOOD GAS, ARTERIAL
Acid-Base Excess: 17.6 mmol/L — ABNORMAL HIGH (ref 0.0–2.0)
Bicarbonate: 48.1 mmol/L — ABNORMAL HIGH (ref 20.0–28.0)
FIO2: 0.4
MECHVT: 400 mL
O2 Saturation: 94.1 %
PEEP: 5 cmH2O
Patient temperature: 37
RATE: 12 resp/min
pCO2 arterial: 100 mmHg (ref 32.0–48.0)
pH, Arterial: 7.29 — ABNORMAL LOW (ref 7.350–7.450)
pO2, Arterial: 79 mmHg — ABNORMAL LOW (ref 83.0–108.0)

## 2020-01-27 LAB — CBC
HCT: 36.6 % (ref 36.0–46.0)
Hemoglobin: 10.8 g/dL — ABNORMAL LOW (ref 12.0–15.0)
MCH: 30.7 pg (ref 26.0–34.0)
MCHC: 29.5 g/dL — ABNORMAL LOW (ref 30.0–36.0)
MCV: 104 fL — ABNORMAL HIGH (ref 80.0–100.0)
Platelets: 191 10*3/uL (ref 150–400)
RBC: 3.52 MIL/uL — ABNORMAL LOW (ref 3.87–5.11)
RDW: 11.5 % (ref 11.5–15.5)
WBC: 7.6 10*3/uL (ref 4.0–10.5)
nRBC: 0.4 % — ABNORMAL HIGH (ref 0.0–0.2)

## 2020-01-27 LAB — BASIC METABOLIC PANEL
Anion gap: 6 (ref 5–15)
BUN: 26 mg/dL — ABNORMAL HIGH (ref 6–20)
CO2: 43 mmol/L — ABNORMAL HIGH (ref 22–32)
Calcium: 8.8 mg/dL — ABNORMAL LOW (ref 8.9–10.3)
Chloride: 92 mmol/L — ABNORMAL LOW (ref 98–111)
Creatinine, Ser: 0.39 mg/dL — ABNORMAL LOW (ref 0.44–1.00)
GFR, Estimated: >60 mL/min (ref >60)
Glucose, Bld: 146 mg/dL — ABNORMAL HIGH (ref 70–99)
Potassium: 3.7 mmol/L (ref 3.5–5.1)
Sodium: 141 mmol/L (ref 135–145)

## 2020-01-27 LAB — HEMOGLOBIN A1C
Hgb A1c MFr Bld: 5.3 % (ref 4.8–5.6)
Mean Plasma Glucose: 105.41 mg/dL

## 2020-01-27 LAB — PHOSPHORUS: Phosphorus: 3.1 mg/dL (ref 2.5–4.6)

## 2020-01-27 LAB — MAGNESIUM: Magnesium: 2.1 mg/dL (ref 1.7–2.4)

## 2020-01-27 LAB — TRIGLYCERIDES: Triglycerides: 112 mg/dL (ref <150)

## 2020-01-27 MED ORDER — STERILE WATER FOR INJECTION IJ SOLN
INTRAMUSCULAR | Status: AC
  Administered 2020-01-27: 10 mL
  Filled 2020-01-27: qty 10

## 2020-01-27 NOTE — Progress Notes (Signed)
Pt responds to voice. Failed weaning today due to 02 sats dropping and appearing distressed with agonal breathing. Pt 02 sats initially dropped with weaning sedation and awake but unable to follow commands, Pt not breathing with vent. SBT to see if pt would do better breathing in pressure support. Pt 02 saturation initially increased but then deteriorated to 85% soon after. Pt re-sedated and placed back on ventilator settings. Daughter updated on status at bedside. Pt bathed and linen changed.

## 2020-01-27 NOTE — Progress Notes (Addendum)
CRITICAL CARE NOTE  50 y.o. Female admitted 01/22/20 with Acute on Chronic Hypoxic Hypercapnic Respiratory Failure in the setting of Acute COPD Exacerbation & Acute on Chronic HFpEF requiring BiPAP.   Significant Hospital Events:Hospital Course:  1/4: Admission to Stepdown1/6: Increased work of breathing on BiPAP, PCCM consulted, high risk for intubation 01/24/20 in the Stepdown unit, she is noted to have respiratory distress and increased work of breathing in BiPAP.  PCCM is consulted for assistance in management.  She is high risk for intubation. 1/6 intubated On MV support, 8.5 ETT placed Left CVL placed 1/7 remains on vent, severe COPD 1/8 failed weaning trial   Significant Diagnostic Tests:  1/4: CXR>>Cardiomediastinal silhouette unchanged in size and contour. Fullness in the central vasculature. Interlobular septal thickening with reticular opacities. No pneumothorax or large pleural effusion.No confluent airspace disease. Concerning for Acute CHF  CC  follow up respiratory failure  SUBJECTIVE  Remains on the ventilator, intubated Did not do well on weaning trial today.   BP (!) 116/50   Pulse 80   Temp 98.7 F (37.1 C) (Axillary)   Resp 13   Ht 5\' 4"  (1.626 m)   Wt 94.6 kg   SpO2 92%   BMI 35.80 kg/m    I/O last 3 completed shifts: In: 4045 [I.V.:1568; Other:30; DX/AJ:2878; IV MVEHMCNOB:096] Out: 2836 [Urine:1185; Stool:230] Total I/O In: 138.9 [I.V.:58.1; NG/GT:80.8] Out: 125 [Urine:125]  SpO2: 92 % O2 Flow Rate (L/min): 4 L/min FiO2 (%): 40 %  Estimated body mass index is 35.8 kg/m as calculated from the following:   Height as of this encounter: 5\' 4"  (1.626 m).   Weight as of this encounter: 94.6 kg.  SIGNIFICANT EVENTS   REVIEW OF SYSTEMS  PATIENT IS UNABLE TO PROVIDE COMPLETE REVIEW OF SYSTEMS DUE TO SEVERE CRITICAL ILLNESS      PHYSICAL EXAMINATION: Gen:      No acute distress HEENT:  EOMI, sclera anicteric Neck:     No masses; no  thyromegaly, ETT Lungs:    Diminished air entry, no wheeze CV:         Regular rate and rhythm; no murmurs Abd:      + bowel sounds; soft, non-tender; no palpable masses, no distension Ext:    No edema; adequate peripheral perfusion Skin:      Warm and dry; no rash Neuro: Sedated  Labs/imaging reviewed Significant for BUN/creatinine 26/0.39 WBC 7.6, hemoglobin 10.8, platelets 191 No new imaging  MEDICATIONS: I have reviewed all medications and confirmed regimen as documented   CULTURE RESULTS   Recent Results (from the past 240 hour(s))  Resp Panel by RT-PCR (Flu A&B, Covid) Nasopharyngeal Swab     Status: None   Collection Time: 01/22/20  1:40 PM   Specimen: Nasopharyngeal Swab; Nasopharyngeal(NP) swabs in vial transport medium  Result Value Ref Range Status   SARS Coronavirus 2 by RT PCR NEGATIVE NEGATIVE Final    Comment: (NOTE) SARS-CoV-2 target nucleic acids are NOT DETECTED.  The SARS-CoV-2 RNA is generally detectable in upper respiratory specimens during the acute phase of infection. The lowest concentration of SARS-CoV-2 viral copies this assay can detect is 138 copies/mL. A negative result does not preclude SARS-Cov-2 infection and should not be used as the sole basis for treatment or other patient management decisions. A negative result may occur with  improper specimen collection/handling, submission of specimen other than nasopharyngeal swab, presence of viral mutation(s) within the areas targeted by this assay, and inadequate number of viral copies(<138 copies/mL). A  negative result must be combined with clinical observations, patient history, and epidemiological information. The expected result is Negative.  Fact Sheet for Patients:  EntrepreneurPulse.com.au  Fact Sheet for Healthcare Providers:  IncredibleEmployment.be  This test is no t yet approved or cleared by the [place] FDA and  has been authorized for  detection and/or diagnosis of SARS-CoV-2 by FDA under an Emergency Use Authorization (EUA). This EUA will remain  in effect (meaning this test can be used) for the duration of the COVID-19 declaration under Section 564(b)(1) of the Act, 21 U.S.C.section 360bbb-3(b)(1), unless the authorization is terminated  or revoked sooner.       Influenza A by PCR NEGATIVE NEGATIVE Final   Influenza B by PCR NEGATIVE NEGATIVE Final    Comment: (NOTE) The Xpert Xpress SARS-CoV-2/FLU/RSV plus assay is intended as an aid in the diagnosis of influenza from Nasopharyngeal swab specimens and should not be used as a sole basis for treatment. Nasal washings and aspirates are unacceptable for Xpert Xpress SARS-CoV-2/FLU/RSV testing.  Fact Sheet for Patients: EntrepreneurPulse.com.au  Fact Sheet for Healthcare Providers: IncredibleEmployment.be  This test is not yet approved or cleared by the [place] FDA and has been authorized for detection and/or diagnosis of SARS-CoV-2 by FDA under an Emergency Use Authorization (EUA). This EUA will remain in effect (meaning this test can be used) for the duration of the COVID-19 declaration under Section 564(b)(1) of the Act, 21 U.S.C. section 360bbb-3(b)(1), unless the authorization is terminated or revoked.  Performed at [facility], [address] [place], [place] [address]   MRSA PCR Screening     Status: None   Collection Time: 01/24/20  2:45 AM   Specimen: Nasal Mucosa; Nasopharyngeal  Result Value Ref Range Status   MRSA by PCR NEGATIVE NEGATIVE Final    Comment:        The GeneXpert MRSA Assay (FDA approved for NASAL specimens only), is one component of a comprehensive MRSA colonization surveillance program. It is not intended to diagnose MRSA infection nor to guide or monitor treatment for MRSA infections. Performed at [facility], [address] [place], [place] [address]            IMAGING    DG Chest Port 1 View  Result Date: 01/27/2020 CLINICAL DATA:  Acute respiratory failure EXAM: PORTABLE CHEST 1 VIEW COMPARISON:  Radiograph 01/26/2020, CT 06/05/2018 FINDINGS: Endotracheal tube tip terminates in the low trachea, 2.5 cm from the carina. Transesophageal tube tip and side port terminate below the GE junction, beyond the margins of imaging. A left IJ approach central venous catheter tip terminates near the superior cavoatrial junction. Persistent low volumes and atelectatic changes with heterogeneous interstitial and airspace opacities in both lungs most coalescent towards the medial right lung base. No visible pneumothorax or effusion. Stable cardiomediastinal contours with a calcified aorta. No acute osseous or soft tissue abnormality. IMPRESSION: 1. Endotracheal tube tip terminates in the low trachea, 2.5 cm from the carina. Could be retracted 1-2 cm to the mid trachea. 2. Transesophageal tube tip and side port terminate below the GE junction, beyond the margins of imaging. 3. Left IJ approach central venous catheter terminates at the superior cavoatrial junction. 4. Persistently low lung volumes and atelectasis with vascular congestion and mixed heterogeneous opacities in both lungs which could reflect airspace disease and/or edema, similar to prior. Electronically Signed   By: Lovena Le M.D.   On: 01/27/2020 04:25     Nutrition Status: Nutrition Problem: Inadequate oral  intake Etiology: inability to eat (pt sedated and ventilated) Signs/Symptoms: NPO status   Vent Mode: PRVC FiO2 (%):  [40 %] 40 % Set Rate:  [12 bmp] 12 bmp Vt Set:  [400 mL] 400 mL PEEP:  [5 cmH20] 5 cmH20 Plateau Pressure:  [16 cmH20-18 cmH20] 16 cmH20 BMP Latest Ref Rng & Units 01/27/2020 01/26/2020 01/25/2020  Glucose 70 - 99 mg/dL 146(H) 143(H) 134(H)  BUN 6 - 20 mg/dL 26(H) 28(H) 38(H)  Creatinine 0.44 - 1.00 mg/dL 0.39(L) 0.41(L) 0.68  Sodium 135 - 145 mmol/L 141 140 138  Potassium  3.5 - 5.1 mmol/L 3.7 3.9 3.7  Chloride 98 - 111 mmol/L 92(L) 89(L) 85(L)  CO2 22 - 32 mmol/L 43(H) 43(H) 45(H)  Calcium 8.9 - 10.3 mg/dL 8.8(L) 8.9 8.9       Indwelling Urinary Catheter continued, requirement due to   Reason to continue Indwelling Urinary Catheter strict Intake/Output monitoring for hemodynamic instability   Central Line/ continued, requirement due to  Reason to continue [employer] of central venous pressure or other hemodynamic parameters and poor IV access   Ventilator continued, requirement due to severe respiratory failure   Ventilator Sedation RASS 0 to -2      ASSESSMENT AND PLAN SYNOPSIS  50 yo white female with end stage COPD, asthma with progressive resp failure failed biPAP +Cannibus abuse with severe hypoxic and hypercapnic resp failure and severe COPD exacerbation plan for therapy for CAP  Severe ACUTE Hypoxic and Hypercapnic Respiratory Failure Continue vent support, PSV trials as tolerated Continue bronchodilator Trelegy Ceftriaxone, azithromycin for CAP coverage Daliresp, Singulair  ACUTE DIASTOLIC CARDIAC FAILURE-  Diamox, Lasix as tolerated    NEUROLOGY Anxiety, depression - intubated and sedated - minimal sedation to achieve a RASS goal: -1 Daily wake up assessment Continue Lexapro, Seroquel   CARDIAC ICU monitoring  GI GI PROPHYLAXIS as indicated  NUTRITIONAL STATUS Nutrition Status: Nutrition Problem: Inadequate oral intake Etiology: inability to eat (pt sedated and ventilated) Signs/Symptoms: NPO status    DIET-->TF's as tolerated Constipation protocol as indicated  ENDO - will use ICU hypoglycemic\Hyperglycemia protocol if indicated    ELECTROLYTES -follow labs as needed -replace as needed -pharmacy consultation and following  Husband updated on 1/8 and 1/9.   DVT/GI PRX ordered and assessed TRANSFUSIONS AS NEEDED MONITOR FSBS I Assessed the need for Labs I Assessed the need for Foley I  Assessed the need for Central Venous Line Family Discussion when available I Assessed the need for Mobilization I made an Assessment of medications to be adjusted accordingly Safety Risk assessment completed   CASE DISCUSSED IN [place] ICU TEAM  The patient is critically ill with multiple organ system failure and requires high complexity decision making for assessment and support, frequent evaluation and titration of therapies, advanced monitoring, review of radiographic studies and interpretation of complex data.   Critical Care Time devoted to patient care services, exclusive of separately billable procedures, described in this note is 35 minutes.   Marshell Garfinkel MD [facility] Please see [email or web address] for pager details.  01/27/2020, 8:39 AM

## 2020-01-28 DIAGNOSIS — I5033 Acute on chronic diastolic (congestive) heart failure: Secondary | ICD-10-CM | POA: Diagnosis not present

## 2020-01-28 DIAGNOSIS — F419 Anxiety disorder, unspecified: Secondary | ICD-10-CM

## 2020-01-28 DIAGNOSIS — J441 Chronic obstructive pulmonary disease with (acute) exacerbation: Secondary | ICD-10-CM | POA: Diagnosis not present

## 2020-01-28 DIAGNOSIS — J9621 Acute and chronic respiratory failure with hypoxia: Secondary | ICD-10-CM | POA: Diagnosis not present

## 2020-01-28 LAB — BASIC METABOLIC PANEL
Anion gap: 7 (ref 5–15)
BUN: 28 mg/dL — ABNORMAL HIGH (ref 6–20)
CO2: 42 mmol/L — ABNORMAL HIGH (ref 22–32)
Calcium: 8.7 mg/dL — ABNORMAL LOW (ref 8.9–10.3)
Chloride: 93 mmol/L — ABNORMAL LOW (ref 98–111)
Creatinine, Ser: 0.39 mg/dL — ABNORMAL LOW (ref 0.44–1.00)
GFR, Estimated: >60 mL/min (ref >60)
Glucose, Bld: 142 mg/dL — ABNORMAL HIGH (ref 70–99)
Potassium: 3.7 mmol/L (ref 3.5–5.1)
Sodium: 142 mmol/L (ref 135–145)

## 2020-01-28 LAB — GLUCOSE, CAPILLARY
Glucose-Capillary: 103 mg/dL — ABNORMAL HIGH (ref 70–99)
Glucose-Capillary: 114 mg/dL — ABNORMAL HIGH (ref 70–99)
Glucose-Capillary: 127 mg/dL — ABNORMAL HIGH (ref 70–99)
Glucose-Capillary: 134 mg/dL — ABNORMAL HIGH (ref 70–99)
Glucose-Capillary: 138 mg/dL — ABNORMAL HIGH (ref 70–99)
Glucose-Capillary: 151 mg/dL — ABNORMAL HIGH (ref 70–99)

## 2020-01-28 LAB — PHOSPHORUS: Phosphorus: 3 mg/dL (ref 2.5–4.6)

## 2020-01-28 LAB — CBC
HCT: 34.4 % — ABNORMAL LOW (ref 36.0–46.0)
Hemoglobin: 10.2 g/dL — ABNORMAL LOW (ref 12.0–15.0)
MCH: 30.7 pg (ref 26.0–34.0)
MCHC: 29.7 g/dL — ABNORMAL LOW (ref 30.0–36.0)
MCV: 103.6 fL — ABNORMAL HIGH (ref 80.0–100.0)
Platelets: 193 10*3/uL (ref 150–400)
RBC: 3.32 MIL/uL — ABNORMAL LOW (ref 3.87–5.11)
RDW: 11.5 % (ref 11.5–15.5)
WBC: 7.5 10*3/uL (ref 4.0–10.5)
nRBC: 0.5 % — ABNORMAL HIGH (ref 0.0–0.2)

## 2020-01-28 LAB — MAGNESIUM: Magnesium: 2 mg/dL (ref 1.7–2.4)

## 2020-01-28 LAB — TRIGLYCERIDES: Triglycerides: 112 mg/dL (ref <150)

## 2020-01-28 MED ORDER — ENOXAPARIN SODIUM 60 MG/0.6ML ~~LOC~~ SOLN
0.5000 mg/kg | SUBCUTANEOUS | Status: AC
  Administered 2020-01-28 – 2020-02-01 (×5): 50 mg via SUBCUTANEOUS
  Filled 2020-01-28 (×7): qty 0.6

## 2020-01-28 MED ORDER — STERILE WATER FOR INJECTION IJ SOLN
INTRAMUSCULAR | Status: AC
  Administered 2020-01-28: 10 mL
  Filled 2020-01-28: qty 10

## 2020-01-28 MED ORDER — METHYLPREDNISOLONE SODIUM SUCC 40 MG IJ SOLR
40.0000 mg | Freq: Two times a day (BID) | INTRAMUSCULAR | Status: DC
  Administered 2020-01-28 – 2020-02-04 (×13): 40 mg via INTRAVENOUS
  Filled 2020-01-28 (×15): qty 1

## 2020-01-28 NOTE — Progress Notes (Signed)
Pt only responds to pain. When turned pt flails and desats, resolves with sedation and quiet. Sedation reduced to get to RASS goal and eventually SBT. Pt became dyssynchronous with vent when sedation reduced slightly and de-sated. Failed WUA. Unable to wean sedation at this time. MD made aware. Plans to talk to family about tracheostomy. Bathed. Hair washed and combed. Foley and colostomy intact. Tolerating tube feeds. OG and ETT intact.

## 2020-01-28 NOTE — Progress Notes (Signed)
CRITICAL CARE NOTE  50 y.o. Female admitted 01/22/20 with Acute on Chronic Hypoxic Hypercapnic Respiratory Failure in the setting of Acute COPD Exacerbation & Acute on Chronic HFpEF requiring BiPAP.   Significant Hospital Events:Hospital Course:  1/4: Admission to Stepdown1/6: Increased work of breathingon BiPAP, PCCM consulted, high risk for intubation 01/24/20 in the Stepdown unit, she is noted to have respiratory distress and increased work of breathing in BiPAP. PCCM is consulted for assistance in management. She is high risk for intubation. 1/6 intubated On MV support, 8.5 ETT placed Left CVL placed 1/7 remains on vent, severe COPD 1/8 failed weaning trial 1/10 failed multiple weaning trials   Significant Diagnostic Tests:  1/4: CXR>>Cardiomediastinal silhouette unchanged in size and contour. Fullness in the central vasculature. Interlobular septal thickening with reticular opacities. No pneumothorax or large pleural effusion.No confluent airspace disease. Concerning for Acute CHF     CC  follow up respiratory failure  SUBJECTIVE Patient remains critically ill Prognosis is guarded End stage COPD Severe resp failure    BP (!) 111/58   Pulse (!) 56   Temp 97.9 F (36.6 C) (Axillary)   Resp 12   Ht $R'5\' 4"'pz$  (1.626 m)   Wt 95.1 kg   SpO2 96%   BMI 35.99 kg/m    I/O last 3 completed shifts: In: 3881.4 [I.V.:1481.4; NG/GT:1915; IV Piggyback:485.1] Out: 1610 [Urine:1405; Stool:300] Total I/O In: 121.7 [I.V.:56.7; NG/GT:65] Out: 100 [Urine:100]  SpO2: 96 % O2 Flow Rate (L/min): 4 L/min FiO2 (%): 35 %  Estimated body mass index is 35.99 kg/m as calculated from the following:   Height as of this encounter: $RemoveBeforeD'5\' 4"'KvJXIaiOLvcbFu$  (1.626 m).   Weight as of this encounter: 95.1 kg.  SIGNIFICANT EVENTS   REVIEW OF SYSTEMS  PATIENT IS UNABLE TO PROVIDE COMPLETE REVIEW OF SYSTEMS DUE TO SEVERE CRITICAL ILLNESS        PHYSICAL EXAMINATION:  GENERAL:critically ill  appearing, +resp distress HEAD: Normocephalic, atraumatic.  EYES: Pupils equal, round, reactive to light.  No scleral icterus.  MOUTH: Moist mucosal membrane. NECK: Supple.  PULMONARY: +rhonchi, +wheezing CARDIOVASCULAR: S1 and S2. Regular rate and rhythm. No murmurs, rubs, or gallops.  GASTROINTESTINAL: Soft, nontender, -distended.  Positive bowel sounds.   MUSCULOSKELETAL: No swelling, clubbing, or edema.  NEUROLOGIC: obtunded, GCS<8 SKIN:intact,warm,dry  MEDICATIONS: I have reviewed all medications and confirmed regimen as documented   CULTURE RESULTS   Recent Results (from the past 240 hour(s))  Resp Panel by RT-PCR (Flu A&B, Covid) Nasopharyngeal Swab     Status: None   Collection Time: 01/22/20  1:40 PM   Specimen: Nasopharyngeal Swab; Nasopharyngeal(NP) swabs in vial transport medium  Result Value Ref Range Status   SARS Coronavirus 2 by RT PCR NEGATIVE NEGATIVE Final    Comment: (NOTE) SARS-CoV-2 target nucleic acids are NOT DETECTED.  The SARS-CoV-2 RNA is generally detectable in upper respiratory specimens during the acute phase of infection. The lowest concentration of SARS-CoV-2 viral copies this assay can detect is 138 copies/mL. A negative result does not preclude SARS-Cov-2 infection and should not be used as the sole basis for treatment or other patient management decisions. A negative result may occur with  improper specimen collection/handling, submission of specimen other than nasopharyngeal swab, presence of viral mutation(s) within the areas targeted by this assay, and inadequate number of viral copies(<138 copies/mL). A negative result must be combined with clinical observations, patient history, and epidemiological information. The expected result is Negative.  Fact Sheet for Patients:  EntrepreneurPulse.com.au  Fact Sheet  for Healthcare Providers:  IncredibleEmployment.be  This test is no t yet approved or cleared  by the [place] and  has been authorized for detection and/or diagnosis of SARS-CoV-2 by FDA under an Emergency Use Authorization (EUA). This EUA will remain  in effect (meaning this test can be used) for the duration of the COVID-19 declaration under Section 564(b)(1) of the Act, 21 U.S.C.section 360bbb-3(b)(1), unless the authorization is terminated  or revoked sooner.       Influenza A by PCR NEGATIVE NEGATIVE Final   Influenza B by PCR NEGATIVE NEGATIVE Final    Comment: (NOTE) The Xpert Xpress SARS-CoV-2/FLU/RSV plus assay is intended as an aid in the diagnosis of influenza from Nasopharyngeal swab specimens and should not be used as a sole basis for treatment. Nasal washings and aspirates are unacceptable for Xpert Xpress SARS-CoV-2/FLU/RSV testing.  Fact Sheet for Patients: EntrepreneurPulse.com.au  Fact Sheet for Healthcare Providers: IncredibleEmployment.be  This test is not yet approved or cleared by the [place] FDA and has been authorized for detection and/or diagnosis of SARS-CoV-2 by FDA under an Emergency Use Authorization (EUA). This EUA will remain in effect (meaning this test can be used) for the duration of the COVID-19 declaration under Section 564(b)(1) of the Act, 21 U.S.C. section 360bbb-3(b)(1), unless the authorization is terminated or revoked.  Performed at [facility], [address] [place], [place] [address]   MRSA PCR Screening     Status: None   Collection Time: 01/24/20  2:45 AM   Specimen: Nasal Mucosa; Nasopharyngeal  Result Value Ref Range Status   MRSA by PCR NEGATIVE NEGATIVE Final    Comment:        The GeneXpert MRSA Assay (FDA approved for NASAL specimens only), is one component of a comprehensive MRSA colonization surveillance program. It is not intended to diagnose MRSA infection nor to guide or monitor treatment for MRSA infections. Performed at [facility], [address], [place], [place] [address]           IMAGING    No results found.   Nutrition Status: Nutrition Problem: Inadequate oral intake Etiology: inability to eat (pt sedated and ventilated) Signs/Symptoms: NPO status       Indwelling Urinary Catheter continued, requirement due to   Reason to continue Indwelling Urinary Catheter strict Intake/Output monitoring for hemodynamic instability   Central Line/ continued, requirement due to  Reason to continue [place] of central venous pressure or other hemodynamic parameters and poor IV access   Ventilator continued, requirement due to severe respiratory failure   Ventilator Sedation RASS 0 to -2      ASSESSMENT AND PLAN SYNOPSIS  50 yo white female with end stage COPD, asthma with progressive resp failure failed biPAP +Cannibus abuse with severe hypoxic and hypercapnic resp failure and severe COPD exacerbation plan for therapy for CAP, failure to wean from vent Will likely need TRACH  Severe ACUTE Hypoxic and Hypercapnic Respiratory Failure -continue Full MV support -continue Bronchodilator Therapy -Wean Fio2 and PEEP as tolerated -will perform SAT/SBT when respiratory parameters are met -VAP/VENT bundle implementation  ACUTE DIASTOLIC CARDIAC FAILURE-  -oxygen as needed -Lasix as tolerated  obesity, possible OSA.   Will certainly impact respiratory mechanics, ventilator weaning Suspect will need to consider additional PEEP   NEUROLOGY Acute toxic metabolic encephalopathy, need for sedation Goal RASS -2 to -3   CARDIAC ICU monitoring  ID -continue IV abx as prescibed -follow up cultures  GI GI PROPHYLAXIS  as indicated  NUTRITIONAL STATUS Nutrition Status: Nutrition Problem: Inadequate oral intake Etiology: inability to eat (pt sedated and ventilated) Signs/Symptoms: NPO status     DIET-->TF's as tolerated Constipation protocol as indicated  ENDO - will use ICU  hypoglycemic\Hyperglycemia protocol if indicated     ELECTROLYTES -follow labs as needed -replace as needed -pharmacy consultation and following   DVT/GI PRX ordered and assessed TRANSFUSIONS AS NEEDED MONITOR FSBS I Assessed the need for Labs I Assessed the need for Foley I Assessed the need for Central Venous Line Family Discussion when available I Assessed the need for Mobilization I made an Assessment of medications to be adjusted accordingly Safety Risk assessment completed   CASE DISCUSSED IN MULTIDISCIPLINARY ROUNDS WITH ICU TEAM  Critical Care Time devoted to patient care services described in this note is 45 minutes.   Overall, patient is critically ill, prognosis is guarded.     Corrin Parker, M.D.  [facility]  Medical Director [place] Director [facility] Cardio-Pulmonary Department

## 2020-01-29 DIAGNOSIS — I5033 Acute on chronic diastolic (congestive) heart failure: Secondary | ICD-10-CM | POA: Diagnosis not present

## 2020-01-29 DIAGNOSIS — J441 Chronic obstructive pulmonary disease with (acute) exacerbation: Secondary | ICD-10-CM | POA: Diagnosis not present

## 2020-01-29 DIAGNOSIS — J9621 Acute and chronic respiratory failure with hypoxia: Secondary | ICD-10-CM | POA: Diagnosis not present

## 2020-01-29 DIAGNOSIS — J9622 Acute and chronic respiratory failure with hypercapnia: Secondary | ICD-10-CM | POA: Diagnosis not present

## 2020-01-29 LAB — GLUCOSE, CAPILLARY
Glucose-Capillary: 150 mg/dL — ABNORMAL HIGH (ref 70–99)
Glucose-Capillary: 121 mg/dL — ABNORMAL HIGH (ref 70–99)
Glucose-Capillary: 109 mg/dL — ABNORMAL HIGH (ref 70–99)
Glucose-Capillary: 117 mg/dL — ABNORMAL HIGH (ref 70–99)
Glucose-Capillary: 78 mg/dL (ref 70–99)
Glucose-Capillary: 156 mg/dL — ABNORMAL HIGH (ref 70–99)

## 2020-01-29 LAB — TRIGLYCERIDES: Triglycerides: 135 mg/dL (ref <150)

## 2020-01-29 LAB — BASIC METABOLIC PANEL
Anion gap: 8 (ref 5–15)
BUN: 31 mg/dL — ABNORMAL HIGH (ref 6–20)
CO2: 40 mmol/L — ABNORMAL HIGH (ref 22–32)
Calcium: 8.7 mg/dL — ABNORMAL LOW (ref 8.9–10.3)
Chloride: 95 mmol/L — ABNORMAL LOW (ref 98–111)
Creatinine, Ser: 0.31 mg/dL — ABNORMAL LOW (ref 0.44–1.00)
GFR, Estimated: >60 mL/min (ref >60)
Glucose, Bld: 128 mg/dL — ABNORMAL HIGH (ref 70–99)
Potassium: 3.8 mmol/L (ref 3.5–5.1)
Sodium: 143 mmol/L (ref 135–145)

## 2020-01-29 MED ORDER — DEXTROSE 50 % IV SOLN
INTRAVENOUS | Status: AC
  Filled 2020-01-29: qty 50

## 2020-01-29 MED ORDER — STERILE WATER FOR INJECTION IJ SOLN
INTRAMUSCULAR | Status: AC
  Administered 2020-01-29: 5 mL
  Filled 2020-01-29: qty 10

## 2020-01-29 MED ORDER — INSULIN ASPART 100 UNIT/ML ~~LOC~~ SOLN
SUBCUTANEOUS | Status: AC
  Filled 2020-01-29: qty 1

## 2020-01-29 NOTE — Plan of Care (Signed)
  Problem: Education: Goal: Knowledge of General Education information will improve Description: Including pain rating scale, medication(s)/side effects and non-pharmacologic comfort measures Outcome: Not Progressing unable to educate pt   Problem: Activity: Goal: Risk for activity intolerance will decrease Outcome: Not Progressing, pt still on full vent support and on bedside rest   Problem: Health Behavior/Discharge Planning: Goal: Ability to manage health-related needs will improve Outcome: Not Progressing Unable due to medical condition and pt on full vent

## 2020-01-29 NOTE — Progress Notes (Signed)
CRITICAL CARE NOTE  50 y.o. Female admitted 01/22/20 with Acute on Chronic Hypoxic Hypercapnic Respiratory Failure in the setting of Acute COPD Exacerbation & Acute on Chronic HFpEF requiring BiPAP.   Significant Hospital Events:Hospital Course:  1/4: Admission to Stepdown1/6: Increased work of breathingon BiPAP, PCCM consulted, high risk for intubation 01/24/20 in the Stepdown unit, she is noted to have respiratory distress and increased work of breathing in BiPAP. PCCM is consulted for assistance in management. She is high risk for intubation. 1/6 intubated On MV support, 8.5 ETT placed Left CVL placed 1/7 remains on vent, severe COPD 1/8 failed weaning trial 1/10 failed multiple weaning trials   Significant Diagnostic Tests:  1/4: CXR>>Cardiomediastinal silhouette unchanged in size and contour. Fullness in the central vasculature. Interlobular septal thickening with reticular opacities. No pneumothorax or large pleural effusion.No confluent airspace disease. Concerning for Acute CHF     CC  follow up respiratory failure  SUBJECTIVE Patient remains critically ill Prognosis is guarded End stage COPD Severe resp failure    BP (!) 109/56   Pulse (!) 56   Temp 98.1 F (36.7 C) (Oral)   Resp 12   Ht 5' 4.02" (1.626 m)   Wt 95.1 kg   SpO2 (!) 89%   BMI 35.97 kg/m    I/O last 3 completed shifts: In: 1660 [I.V.:1589.3; NG/GT:1670.8; IV Piggyback:482.9] Out: 1880 [Urine:1530; Stool:350] Total I/O In: 171.9 [I.V.:41.9; NG/GT:130] Out: 85 [Urine:85]  SpO2: (!) 89 % O2 Flow Rate (L/min): 4 L/min FiO2 (%): 35 %  Estimated body mass index is 35.97 kg/m as calculated from the following:   Height as of this encounter: 5' 4.02" (1.626 m).   Weight as of this encounter: 95.1 kg.  REVIEW OF SYSTEMS  PATIENT IS UNABLE TO PROVIDE COMPLETE REVIEW OF SYSTEM S DUE TO SEVERE CRITICAL ILLNESS AND ENCEPHALOPATHY    PHYSICAL EXAMINATION:  GENERAL:critically ill  appearing, +resp distress HEAD: Normocephalic, atraumatic.  EYES: Pupils equal, round, reactive to light.  No scleral icterus.  MOUTH: Moist mucosal membrane. NECK: Supple. No thyromegaly. No nodules. No JVD.  PULMONARY: +rhonchi, +wheezing CARDIOVASCULAR: S1 and S2. Regular rate and rhythm. No murmurs, rubs, or gallops.  GASTROINTESTINAL: Soft, nontender, -distended. Positive bowel sounds.  MUSCULOSKELETAL: No swelling, clubbing, or edema.  NEUROLOGIC: obtunded SKIN:intact,warm,dry   MEDICATIONS: I have reviewed all medications and confirmed regimen as documented   CULTURE RESULTS   Recent Results (from the past 240 hour(s))  Resp Panel by RT-PCR (Flu A&B, Covid) Nasopharyngeal Swab     Status: None   Collection Time: 01/22/20  1:40 PM   Specimen: Nasopharyngeal Swab; Nasopharyngeal(NP) swabs in vial transport medium  Result Value Ref Range Status   SARS Coronavirus 2 by RT PCR NEGATIVE NEGATIVE Final    Comment: (NOTE) SARS-CoV-2 target nucleic acids are NOT DETECTED.  The SARS-CoV-2 RNA is generally detectable in upper respiratory specimens during the acute phase of infection. The lowest concentration of SARS-CoV-2 viral copies this assay can detect is 138 copies/mL. A negative result does not preclude SARS-Cov-2 infection and should not be used as the sole basis for treatment or other patient management decisions. A negative result may occur with  improper specimen collection/handling, submission of specimen other than nasopharyngeal swab, presence of viral mutation(s) within the areas targeted by this assay, and inadequate number of viral copies(<138 copies/mL). A negative result must be combined with clinical observations, patient history, and epidemiological information. The expected result is Negative.  Fact Sheet for Patients:  EntrepreneurPulse.com.au  Fact  Sheet for Healthcare Providers:  IncredibleEmployment.be  This test is  no t yet approved or cleared by the [place] FDA and  has been authorized for detection and/or diagnosis of SARS-CoV-2 by FDA under an Emergency Use Authorization (EUA). This EUA will remain  in effect (meaning this test can be used) for the duration of the COVID-19 declaration under Section 564(b)(1) of the Act, 21 U.S.C.section 360bbb-3(b)(1), unless the authorization is terminated  or revoked sooner.       Influenza A by PCR NEGATIVE NEGATIVE Final   Influenza B by PCR NEGATIVE NEGATIVE Final    Comment: (NOTE) The Xpert Xpress SARS-CoV-2/FLU/RSV plus assay is intended as an aid in the diagnosis of influenza from Nasopharyngeal swab specimens and should not be used as a sole basis for treatment. Nasal washings and aspirates are unacceptable for Xpert Xpress SARS-CoV-2/FLU/RSV testing.  Fact Sheet for Patients: EntrepreneurPulse.com.au  Fact Sheet for Healthcare Providers: IncredibleEmployment.be  This test is not yet approved or cleared by the [place] FDA and has been authorized for detection and/or diagnosis of SARS-CoV-2 by FDA under an Emergency Use Authorization (EUA). This EUA will remain in effect (meaning this test can be used) for the duration of the COVID-19 declaration under Section 564(b)(1) of the Act, 21 U.S.C. section 360bbb-3(b)(1), unless the authorization is terminated or revoked.  Performed at [facility], [address] [place], [place] [address]   MRSA PCR Screening     Status: None   Collection Time: 01/24/20  2:45 AM   Specimen: Nasal Mucosa; Nasopharyngeal  Result Value Ref Range Status   MRSA by PCR NEGATIVE NEGATIVE Final    Comment:        The GeneXpert MRSA Assay (FDA approved for NASAL specimens only), is one component of a comprehensive MRSA colonization surveillance program. It is not intended to diagnose MRSA infection nor to guide or monitor treatment for MRSA  infections. Performed at [facility], [address], [place], [place] [address]           IMAGING    No results found.   Nutrition Status: Nutrition Problem: Inadequate oral intake Etiology: inability to eat (pt sedated and ventilated) Signs/Symptoms: NPO status         Indwelling Urinary Catheter continued, requirement due to   Reason to continue Indwelling Urinary Catheter strict Intake/Output monitoring for hemodynamic instability   Central Line/ continued, requirement due to  Reason to continue [place] of central venous pressure or other hemodynamic parameters and poor IV access   Ventilator continued, requirement due to severe respiratory failure   Ventilator Sedation RASS 0 to -2       ASSESSMENT AND PLAN SYNOPSIS  50 yo white female with end stage COPD, asthma with progressive resp failure failed biPAP +Cannibus abuse with severe hypoxic and hypercapnic resp failure and severe COPD exacerbation plan for therapy for CAP, failure to wean from vent Will likely need TRACH  Severe ACUTE Hypoxic and Hypercapnic Respiratory Failure -continue Mechanical Ventilator support -continue Bronchodilator Therapy -Wean Fio2 and PEEP as tolerated -VAP/VENT bundle implementation  SEVERE COPD EXACERBATION -continue IV steroids as prescribed -continue NEB THERAPY as prescribed -morphine as needed -wean fio2 as needed and tolerated   ACUTE DIASTOLIC CARDIAC FAILURE-  -oxygen as needed -Lasix as tolerated  obesity, possible OSA.   Will certainly impact respiratory mechanics, ventilator weaning Suspect will need to consider additional PEEP   NEUROLOGY Acute toxic metabolic encephalopathy, need for sedation Goal RASS -2 to -  3   CARDIAC ICU monitoring   GI GI PROPHYLAXIS as indicated  NUTRITIONAL STATUS DIET-->TF's as tolerated Constipation protocol as indicated  ENDO - ICU hypoglycemic\Hyperglycemia protocol -check FSBS per  protocol    ELECTROLYTES -follow labs as needed -replace as needed -pharmacy consultation and following   DVT/GI PRX ordered and assessed TRANSFUSIONS AS NEEDED MONITOR FSBS I Assessed the need for Labs I Assessed the need for Foley I Assessed the need for Central Venous Line Family Discussion when available I Assessed the need for Mobilization I made an Assessment of medications to be adjusted accordingly Safety Risk assessment completed  CASE DISCUSSED IN MULTIDISCIPLINARY ROUNDS WITH ICU TEAM    Critical Care Time devoted to patient care services described in this note is 45 minutes.   Overall, patient is critically ill, prognosis is guarded.  Patient with Multiorgan failure and at high risk for cardiac arrest and death.     Corrin Parker, M.D.  [facility]  Medical Director [place] Director [facility] Cardio-Pulmonary Department

## 2020-01-29 NOTE — Consult Note (Addendum)
Misty Green, Misty Green [id number] [birth year]  Reason for Consult: Evaluate for tracheostomy Requesting Physician:  [name], MD   HPI: The patient is a 50 year old white female who has end-stage COPD and asthma who had exacerbation and was admitted 01/22/2020.  She worsened and on 01/24/2020 she required intubation and mechanical ventilation.  She has failed to wean over the last couple of days.  Assessment is made for possible tracheostomy  ROS:  Negative except as in HPI.  Past Medical History:  Diagnosis Date  . Cardiac arrest (Misty Green) 05/12/2018  . CHF (congestive heart failure) (Misty Green)   . COPD (chronic obstructive pulmonary disease) (Misty Green)   . Current smoker   . Dyspnea   . GERD (gastroesophageal reflux disease)   . Opiate abuse, continuous ([facility])     Allergies  Allergen Reactions  . Other Nausea And Vomiting    Anti-depressent that starts with t    Scheduled Medications: . acetaZOLAMIDE  500 mg Intravenous Q12H  . budesonide (PULMICORT) nebulizer solution  0.5 mg Nebulization BID  . chlorhexidine gluconate (MEDLINE KIT)  15 mL Mouth Rinse BID  . Chlorhexidine Gluconate Cloth  6 each Topical Q0600  . docusate  100 mg Per Tube BID  . enoxaparin (LOVENOX) injection  0.5 mg/kg Subcutaneous Q24H  . escitalopram  20 mg Per Tube Daily  . feeding supplement (PROSource TF)  45 mL Per Tube Daily  . fluticasone  1 spray Each Nare QHS  . hydrOXYzine  25 mg Per Tube TID  . insulin aspart  0-15 Units Subcutaneous Q4H  . ipratropium-albuterol  3 mL Nebulization Q6H  . mouth rinse  15 mL Mouth Rinse 10 times per day  . methylPREDNISolone (SOLU-MEDROL) injection  40 mg Intravenous Q12H  . montelukast  10 mg Per Tube QHS  . multivitamin  15 mL Per Tube Daily  . polyethylene glycol  17 g Per Tube Daily  . QUEtiapine  50 mg Per Tube QHS  . roflumilast  500 mcg Per Tube Daily    PRN Meds: sodium chloride, acetaminophen **OR** acetaminophen, albuterol, fentaNYL, guaiFENesin-codeine, LORazepam,  morphine injection, nicotine, ondansetron (ZOFRAN) IV  Infusions: . sodium chloride 10 mL/hr at 01/29/20 0644  . albuterol 5 mg/hr (01/24/20 0837)  . famotidine (PEPCID) IV Stopped (01/28/20 2114)  . feeding supplement (VITAL HIGH PROTEIN) 50 mL/hr at 01/28/20 2300  . fentaNYL infusion INTRAVENOUS 180 mcg/hr (01/29/20 0644)  . propofol (DIPRIVAN) infusion 25.127 mcg/kg/min (01/29/20 0644)     Physical Exam:  Vitals:   01/29/20 0500 01/29/20 0600  BP: 119/68 114/65  Pulse: (!) 58 (!) 54  Resp: 12 12  Temp:    SpO2: 93% 95%    The patient is orally intubated and is nonresponsive.  She has a thick full neck but you can feel the laryngeal landmarks.  She has a relatively short neck.  There is no swelling or bruising or neck or any signs of inflammation here.  IMPRESSION: The patient has end-stage COPD and now intubated because of respiratory failure.  She has a failure at weaning.  She is a good candidate for tracheostomy.    PLAN:   We will tentatively plan for a tracheostomy on Monday of next week.  We will see if there is any chance that she may still extubate in the next couple of days.  The family has already agreed to proceeding with tracheostomy according to Dr. Francesco Sor 01/29/2020, 7:41 AM

## 2020-01-30 DIAGNOSIS — J9622 Acute and chronic respiratory failure with hypercapnia: Secondary | ICD-10-CM | POA: Diagnosis not present

## 2020-01-30 DIAGNOSIS — J441 Chronic obstructive pulmonary disease with (acute) exacerbation: Secondary | ICD-10-CM | POA: Diagnosis not present

## 2020-01-30 DIAGNOSIS — J9621 Acute and chronic respiratory failure with hypoxia: Secondary | ICD-10-CM | POA: Diagnosis not present

## 2020-01-30 LAB — CBC WITH DIFFERENTIAL/PLATELET
Abs Immature Granulocytes: 0.24 10*3/uL — ABNORMAL HIGH (ref 0.00–0.07)
Basophils Absolute: 0 10*3/uL (ref 0.0–0.1)
Basophils Relative: 0 %
Eosinophils Absolute: 0 10*3/uL (ref 0.0–0.5)
Eosinophils Relative: 0 %
HCT: 34.1 % — ABNORMAL LOW (ref 36.0–46.0)
Hemoglobin: 9.8 g/dL — ABNORMAL LOW (ref 12.0–15.0)
Immature Granulocytes: 2 %
Lymphocytes Relative: 13 %
Lymphs Abs: 1.5 10*3/uL (ref 0.7–4.0)
MCH: 29.9 pg (ref 26.0–34.0)
MCHC: 28.7 g/dL — ABNORMAL LOW (ref 30.0–36.0)
MCV: 104 fL — ABNORMAL HIGH (ref 80.0–100.0)
Monocytes Absolute: 0.7 10*3/uL (ref 0.1–1.0)
Monocytes Relative: 6 %
Neutro Abs: 9.2 10*3/uL — ABNORMAL HIGH (ref 1.7–7.7)
Neutrophils Relative %: 79 %
Platelets: 193 10*3/uL (ref 150–400)
RBC: 3.28 MIL/uL — ABNORMAL LOW (ref 3.87–5.11)
RDW: 11.7 % (ref 11.5–15.5)
WBC: 11.6 10*3/uL — ABNORMAL HIGH (ref 4.0–10.5)
nRBC: 0.9 % — ABNORMAL HIGH (ref 0.0–0.2)

## 2020-01-30 LAB — GLUCOSE, CAPILLARY
Glucose-Capillary: 103 mg/dL — ABNORMAL HIGH (ref 70–99)
Glucose-Capillary: 106 mg/dL — ABNORMAL HIGH (ref 70–99)
Glucose-Capillary: 108 mg/dL — ABNORMAL HIGH (ref 70–99)
Glucose-Capillary: 127 mg/dL — ABNORMAL HIGH (ref 70–99)
Glucose-Capillary: 135 mg/dL — ABNORMAL HIGH (ref 70–99)
Glucose-Capillary: 97 mg/dL (ref 70–99)

## 2020-01-30 LAB — RENAL FUNCTION PANEL
Albumin: 2.6 g/dL — ABNORMAL LOW (ref 3.5–5.0)
Anion gap: 7 (ref 5–15)
BUN: 26 mg/dL — ABNORMAL HIGH (ref 6–20)
CO2: 40 mmol/L — ABNORMAL HIGH (ref 22–32)
Calcium: 8.5 mg/dL — ABNORMAL LOW (ref 8.9–10.3)
Chloride: 93 mmol/L — ABNORMAL LOW (ref 98–111)
Creatinine, Ser: 0.31 mg/dL — ABNORMAL LOW (ref 0.44–1.00)
GFR, Estimated: >60 mL/min (ref >60)
Glucose, Bld: 120 mg/dL — ABNORMAL HIGH (ref 70–99)
Phosphorus: 3.8 mg/dL (ref 2.5–4.6)
Potassium: 3.7 mmol/L (ref 3.5–5.1)
Sodium: 140 mmol/L (ref 135–145)

## 2020-01-30 LAB — MAGNESIUM: Magnesium: 2 mg/dL (ref 1.7–2.4)

## 2020-01-30 LAB — TRIGLYCERIDES: Triglycerides: 103 mg/dL (ref <150)

## 2020-01-30 MED ORDER — IPRATROPIUM-ALBUTEROL 0.5-2.5 (3) MG/3ML IN SOLN
RESPIRATORY_TRACT | Status: AC
  Administered 2020-01-30: 3 mL
  Filled 2020-01-30: qty 3

## 2020-01-30 MED ORDER — IPRATROPIUM-ALBUTEROL 0.5-2.5 (3) MG/3ML IN SOLN
RESPIRATORY_TRACT | Status: AC
  Filled 2020-01-30: qty 3

## 2020-01-30 MED ORDER — METHYLPREDNISOLONE SODIUM SUCC 125 MG IJ SOLR
INTRAMUSCULAR | Status: AC
  Administered 2020-01-30: 40 mg
  Filled 2020-01-30: qty 2

## 2020-01-30 MED ORDER — SODIUM CHLORIDE FLUSH 0.9 % IV SOLN
INTRAVENOUS | Status: AC
  Filled 2020-01-30: qty 10

## 2020-01-30 NOTE — Progress Notes (Signed)
CRITICAL CARE NOTE 50 y.o. Female admitted 01/22/20 with Acute on Chronic Hypoxic Hypercapnic Respiratory Failure in the setting of Acute COPD Exacerbation & Acute on Chronic HFpEF requiring BiPAP.   Significant Hospital Events:Hospital Course:  1/4: Admission to Stepdown1/6: Increased work of breathingon BiPAP, PCCM consulted, high risk for intubation 01/24/20 in the Stepdown unit, she is noted to have respiratory distress and increased work of breathing in BiPAP. PCCM is consulted for assistance in management. She is high risk for intubation. 1/6 intubated On MV support, 8.5 ETT placed Left CVL placed 1/7 remains on vent, severe COPD 1/8 failed weaning trial 1/10 failed multiple weaning trials 1/11 failing weaning trials 1/12 discussed plan fo care with Husband/Daughter    Significant Diagnostic Tests:  1/4: CXR>>Cardiomediastinal silhouette unchanged in size and contour. Fullness in the central vasculature. Interlobular septal thickening with reticular opacities. No pneumothorax or large pleural effusion.No confluent airspace disease. Concerning for Acute CHF    CC  follow up respiratory failure  SUBJECTIVE Patient remains critically ill Prognosis is guarded Severe end stage COPD    BP (!) 128/59   Pulse (!) 55   Temp 97.7 F (36.5 C)   Resp 11   Ht 5' 4.02" (1.626 m)   Wt 97.7 kg   SpO2 96%   BMI 36.95 kg/m    I/O last 3 completed shifts: In: 3545.6 [I.V.:1737.7; NG/GT:1658.3; IV Piggyback:149.6] Out: 9629 [BMWUX:3244; Stool:250] Total I/O In: 88.9 [I.V.:38.9; NG/GT:50] Out: 100 [Urine:100]  SpO2: 96 % O2 Flow Rate (L/min): 4 L/min FiO2 (%): 40 %  Estimated body mass index is 36.95 kg/m as calculated from the following:   Height as of this encounter: 5' 4.02" (1.626 m).   Weight as of this encounter: 97.7 kg.  SIGNIFICANT EVENTS   REVIEW OF SYSTEMS  PATIENT IS UNABLE TO PROVIDE COMPLETE REVIEW OF SYSTEMS DUE TO SEVERE CRITICAL ILLNESS         PHYSICAL EXAMINATION:  GENERAL:critically ill appearing, +resp distress HEAD: Normocephalic, atraumatic.  EYES: Pupils equal, round, reactive to light.  No scleral icterus.  MOUTH: Moist mucosal membrane. NECK: Supple.  PULMONARY: +rhonchi, +wheezing CARDIOVASCULAR: S1 and S2. Regular rate and rhythm. No murmurs, rubs, or gallops.  GASTROINTESTINAL: Soft, nontender, -distended.  Positive bowel sounds.   MUSCULOSKELETAL: No swelling, clubbing, or edema.  NEUROLOGIC: obtunded, GCS<8 SKIN:intact,warm,dry  MEDICATIONS: I have reviewed all medications and confirmed regimen as documented   CULTURE RESULTS   Recent Results (from the past 240 hour(s))  Resp Panel by RT-PCR (Flu A&B, Covid) Nasopharyngeal Swab     Status: None   Collection Time: 01/22/20  1:40 PM   Specimen: Nasopharyngeal Swab; Nasopharyngeal(NP) swabs in vial transport medium  Result Value Ref Range Status   SARS Coronavirus 2 by RT PCR NEGATIVE NEGATIVE Final    Comment: (NOTE) SARS-CoV-2 target nucleic acids are NOT DETECTED.  The SARS-CoV-2 RNA is generally detectable in upper respiratory specimens during the acute phase of infection. The lowest concentration of SARS-CoV-2 viral copies this assay can detect is 138 copies/mL. A negative result does not preclude SARS-Cov-2 infection and should not be used as the sole basis for treatment or other patient management decisions. A negative result may occur with  improper specimen collection/handling, submission of specimen other than nasopharyngeal swab, presence of viral mutation(s) within the areas targeted by this assay, and inadequate number of viral copies(<138 copies/mL). A negative result must be combined with clinical observations, patient history, and epidemiological information. The expected result is Negative.  Fact Sheet  for Patients:  EntrepreneurPulse.com.au  Fact Sheet for Healthcare Providers:   IncredibleEmployment.be  This test is no t yet approved or cleared by the [place] FDA and  has been authorized for detection and/or diagnosis of SARS-CoV-2 by FDA under an Emergency Use Authorization (EUA). This EUA will remain  in effect (meaning this test can be used) for the duration of the COVID-19 declaration under Section 564(b)(1) of the Act, 21 U.S.C.section 360bbb-3(b)(1), unless the authorization is terminated  or revoked sooner.       Influenza A by PCR NEGATIVE NEGATIVE Final   Influenza B by PCR NEGATIVE NEGATIVE Final    Comment: (NOTE) The Xpert Xpress SARS-CoV-2/FLU/RSV plus assay is intended as an aid in the diagnosis of influenza from Nasopharyngeal swab specimens and should not be used as a sole basis for treatment. Nasal washings and aspirates are unacceptable for Xpert Xpress SARS-CoV-2/FLU/RSV testing.  Fact Sheet for Patients: EntrepreneurPulse.com.au  Fact Sheet for Healthcare Providers: IncredibleEmployment.be  This test is not yet approved or cleared by the [place] FDA and has been authorized for detection and/or diagnosis of SARS-CoV-2 by FDA under an Emergency Use Authorization (EUA). This EUA will remain in effect (meaning this test can be used) for the duration of the COVID-19 declaration under Section 564(b)(1) of the Act, 21 U.S.C. section 360bbb-3(b)(1), unless the authorization is terminated or revoked.  Performed at [facility], [address] [place], [place] [address]   MRSA PCR Screening     Status: None   Collection Time: 01/24/20  2:45 AM   Specimen: Nasal Mucosa; Nasopharyngeal  Result Value Ref Range Status   MRSA by PCR NEGATIVE NEGATIVE Final    Comment:        The GeneXpert MRSA Assay (FDA approved for NASAL specimens only), is one component of a comprehensive MRSA colonization surveillance program. It is not intended to diagnose MRSA infection  nor to guide or monitor treatment for MRSA infections. Performed at [facility], [address], [place], [place] [address]           IMAGING    No results found.   Nutrition Status: Nutrition Problem: Inadequate oral intake Etiology: inability to eat (pt sedated and ventilated) Signs/Symptoms: NPO status       Indwelling Urinary Catheter continued, requirement due to   Reason to continue Indwelling Urinary Catheter strict Intake/Output monitoring for hemodynamic instability   Central Line/ continued, requirement due to  Reason to continue [place] of central venous pressure or other hemodynamic parameters and poor IV access   Ventilator continued, requirement due to severe respiratory failure   Ventilator Sedation RASS 0 to -2      ASSESSMENT AND PLAN SYNOPSIS  50 yo white female with end stage COPD, asthma with progressive resp failure failed biPAP +Cannibus abuse with severe hypoxic and hypercapnic resp failure and severe COPD exacerbation plan for therapy for CAP, failure to wean from vent Will likely need TRACH   Severe ACUTE Hypoxic and Hypercapnic Respiratory Failure -continue Full MV support -continue Bronchodilator Therapy -Wean Fio2 and PEEP as tolerated -VAP/VENT bundle implementation  ACUTE DIASTOLIC CARDIAC FAILURE-  -oxygen as needed -Lasix as tolerated   obesity, possible OSA.   Will certainly impact respiratory mechanics, ventilator weaning Suspect will need to consider additional PEEP  SEVERE COPD EXACERBATION -continue IV steroids as prescribed -continue NEB THERAPY as prescribed -morphine as needed -wean fio2 as needed and tolerated   ACUTE KIDNEY INJURY/Renal Failure -continue Foley Catheter-assess need -  Avoid nephrotoxic agents -Follow urine output, BMP -Ensure adequate renal perfusion, optimize oxygenation -Renal dose medications    NEUROLOGY Acute toxic metabolic encephalopathy, need for  sedation Goal RASS -2 to -3  CARDIAC ICU monitoring  GI GI PROPHYLAXIS as indicated  NUTRITIONAL STATUS Nutrition Status: Nutrition Problem: Inadequate oral intake Etiology: inability to eat (pt sedated and ventilated) Signs/Symptoms: NPO status     DIET-->TF's as tolerated Constipation protocol as indicated  ENDO - will use ICU hypoglycemic\Hyperglycemia protocol if indicated     ELECTROLYTES -follow labs as needed -replace as needed -pharmacy consultation and following   DVT/GI PRX ordered and assessed TRANSFUSIONS AS NEEDED MONITOR FSBS I Assessed the need for Labs I Assessed the need for Foley I Assessed the need for Central Venous Line Family Discussion when available I Assessed the need for Mobilization I made an Assessment of medications to be adjusted accordingly Safety Risk assessment completed   CASE DISCUSSED IN MULTIDISCIPLINARY ROUNDS WITH ICU TEAM  Critical Care Time devoted to patient care services described in this note is 45 minutes.   Overall, patient is critically ill, prognosis is guarded.  Patient with Multiorgan failure and at high risk for cardiac arrest and death.   Plan for SAT/SBT when husband arrives 1/14  Corrin Parker, M.D.  [facility]  Medical Director [place] Director [facility] Cardio-Pulmonary Department

## 2020-01-30 NOTE — Progress Notes (Signed)
Pt transported from PACU to ICU 16. No distress, VS stable.  Bed in low position, alarms are on, call bell in reach, continue to monitor.

## 2020-01-31 DIAGNOSIS — J441 Chronic obstructive pulmonary disease with (acute) exacerbation: Secondary | ICD-10-CM | POA: Diagnosis not present

## 2020-01-31 DIAGNOSIS — I5033 Acute on chronic diastolic (congestive) heart failure: Secondary | ICD-10-CM | POA: Diagnosis not present

## 2020-01-31 DIAGNOSIS — J9622 Acute and chronic respiratory failure with hypercapnia: Secondary | ICD-10-CM | POA: Diagnosis not present

## 2020-01-31 DIAGNOSIS — J9621 Acute and chronic respiratory failure with hypoxia: Secondary | ICD-10-CM | POA: Diagnosis not present

## 2020-01-31 LAB — CBC WITH DIFFERENTIAL/PLATELET
Abs Immature Granulocytes: 0.17 10*3/uL — ABNORMAL HIGH (ref 0.00–0.07)
Basophils Absolute: 0 10*3/uL (ref 0.0–0.1)
Basophils Relative: 0 %
Eosinophils Absolute: 0 10*3/uL (ref 0.0–0.5)
Eosinophils Relative: 0 %
HCT: 33.7 % — ABNORMAL LOW (ref 36.0–46.0)
Hemoglobin: 9.8 g/dL — ABNORMAL LOW (ref 12.0–15.0)
Immature Granulocytes: 2 %
Lymphocytes Relative: 12 %
Lymphs Abs: 1.3 10*3/uL (ref 0.7–4.0)
MCH: 29.7 pg (ref 26.0–34.0)
MCHC: 29.1 g/dL — ABNORMAL LOW (ref 30.0–36.0)
MCV: 102.1 fL — ABNORMAL HIGH (ref 80.0–100.0)
Monocytes Absolute: 0.7 10*3/uL (ref 0.1–1.0)
Monocytes Relative: 6 %
Neutro Abs: 8.9 10*3/uL — ABNORMAL HIGH (ref 1.7–7.7)
Neutrophils Relative %: 80 %
Platelets: 194 10*3/uL (ref 150–400)
RBC: 3.3 MIL/uL — ABNORMAL LOW (ref 3.87–5.11)
RDW: 11.9 % (ref 11.5–15.5)
WBC: 11.2 10*3/uL — ABNORMAL HIGH (ref 4.0–10.5)
nRBC: 0.5 % — ABNORMAL HIGH (ref 0.0–0.2)

## 2020-01-31 LAB — RENAL FUNCTION PANEL
Albumin: 2.6 g/dL — ABNORMAL LOW (ref 3.5–5.0)
Anion gap: 6 (ref 5–15)
BUN: 25 mg/dL — ABNORMAL HIGH (ref 6–20)
CO2: 41 mmol/L — ABNORMAL HIGH (ref 22–32)
Calcium: 8.9 mg/dL (ref 8.9–10.3)
Chloride: 96 mmol/L — ABNORMAL LOW (ref 98–111)
Creatinine, Ser: 0.32 mg/dL — ABNORMAL LOW (ref 0.44–1.00)
GFR, Estimated: >60 mL/min (ref >60)
Glucose, Bld: 127 mg/dL — ABNORMAL HIGH (ref 70–99)
Phosphorus: 3.3 mg/dL (ref 2.5–4.6)
Potassium: 3.8 mmol/L (ref 3.5–5.1)
Sodium: 143 mmol/L (ref 135–145)

## 2020-01-31 LAB — TRIGLYCERIDES: Triglycerides: 92 mg/dL (ref <150)

## 2020-01-31 LAB — MAGNESIUM: Magnesium: 1.8 mg/dL (ref 1.7–2.4)

## 2020-01-31 LAB — GLUCOSE, CAPILLARY
Glucose-Capillary: 103 mg/dL — ABNORMAL HIGH (ref 70–99)
Glucose-Capillary: 113 mg/dL — ABNORMAL HIGH (ref 70–99)
Glucose-Capillary: 126 mg/dL — ABNORMAL HIGH (ref 70–99)
Glucose-Capillary: 128 mg/dL — ABNORMAL HIGH (ref 70–99)
Glucose-Capillary: 129 mg/dL — ABNORMAL HIGH (ref 70–99)
Glucose-Capillary: 131 mg/dL — ABNORMAL HIGH (ref 70–99)
Glucose-Capillary: 77 mg/dL (ref 70–99)
Glucose-Capillary: 91 mg/dL (ref 70–99)

## 2020-01-31 MED ORDER — ADULT MULTIVITAMIN W/MINERALS CH
1.0000 | ORAL_TABLET | Freq: Every day | ORAL | Status: DC
  Administered 2020-02-01 – 2020-02-03 (×3): 1
  Filled 2020-01-31 (×3): qty 1

## 2020-01-31 MED ORDER — VITAL AF 1.2 CAL PO LIQD
1000.0000 mL | ORAL | Status: DC
  Administered 2020-01-31 – 2020-02-02 (×3): 1000 mL

## 2020-01-31 MED ORDER — PROSOURCE TF PO LIQD
90.0000 mL | Freq: Two times a day (BID) | ORAL | Status: DC
  Administered 2020-01-31 – 2020-02-03 (×6): 90 mL
  Filled 2020-01-31: qty 90

## 2020-01-31 MED ORDER — FAMOTIDINE 20 MG PO TABS
20.0000 mg | ORAL_TABLET | Freq: Two times a day (BID) | ORAL | Status: DC
  Administered 2020-01-31 – 2020-02-04 (×7): 20 mg
  Filled 2020-01-31 (×7): qty 1

## 2020-01-31 NOTE — Progress Notes (Signed)
   01/31/20 0920  Clinical Encounter Type  Visited With Patient and family together  Visit Type Initial  Spiritual Encounters  Spiritual Needs Prayer   I along with Jill Poling visited with Ms. Dinning and her spouse. He was very open to the visit and expressed appreciation. He ask for prayer for Ms. Rausch, I and Jill Poling had prayer with them.   [place] , [place]

## 2020-01-31 NOTE — Progress Notes (Addendum)
CRITICAL CARE NOTE 50 y.o. Female admitted 01/22/20 with Acute on Chronic Hypoxic Hypercapnic Respiratory Failure in the setting of Acute COPD Exacerbation & Acute on Chronic HFpEF requiring BiPAP.   Significant Hospital Events:Hospital Course:  1/4: Admission to Stepdown1/6: Increased work of breathingon BiPAP, PCCM consulted, high risk for intubation 01/24/20 in the Stepdown unit, she is noted to have respiratory distress and increased work of breathing in BiPAP. PCCM is consulted for assistance in management. She is high risk for intubation. 1/6 intubated On MV support, 8.5 ETT placed Left CVL placed 1/7 remains on vent, severe COPD 1/8 failed weaning trial 1/10 failed multiple weaning trials 1/11 failing weaning trials 1/12 discussed plan fo care with Husband/Daughter    Significant Diagnostic Tests:  1/4: CXR>>Cardiomediastinal silhouette unchanged in size and contour. Fullness in the central vasculature. Interlobular septal thickening with reticular opacities. No pneumothorax or large pleural effusion.No confluent airspace disease. Concerning for Acute CHF    CC  follow up respiratory failure  SUBJECTIVE Patient remains critically ill Prognosis is guarded Severe end stage COPD   Vent Mode: PRVC FiO2 (%):  [30 %-50 %] 40 % Set Rate:  [12 bmp] 12 bmp Vt Set:  [400 mL] 400 mL PEEP:  [5 cmH20] 5 cmH20 Plateau Pressure:  [13.7 cmH20-18 cmH20] 18 cmH20  BP (!) 131/59   Pulse (!) 55   Temp 99.3 F (37.4 C) (Axillary)   Resp 12   Ht 5' 4.02" (1.626 m)   Wt 100 kg   SpO2 95%   BMI 37.82 kg/m    I/O last 3 completed shifts: In: 3759.4 [I.V.:1689.9; NG/GT:1970; IV Piggyback:99.6] Out: 1980 [Urine:1810; Stool:170] Total I/O In: 515.6 [I.V.:416.7; IV Piggyback:99] Out: 465 [Urine:465]  SpO2: 95 % O2 Flow Rate (L/min): 4 L/min FiO2 (%): 40 %  Estimated body mass index is 37.82 kg/m as calculated from the following:   Height as of this encounter: 5' 4.02"  (1.626 m).   Weight as of this encounter: 100 kg.  CRITICAL CARE NOTE  CC  follow up respiratory failure  SUBJECTIVE Patient remains critically ill Prognosis is guarded   BP (!) 131/59   Pulse (!) 55   Temp 99.3 F (37.4 C) (Axillary)   Resp 12   Ht 5' 4.02" (1.626 m)   Wt 100 kg   SpO2 95%   BMI 37.82 kg/m    I/O last 3 completed shifts: In: 3759.4 [I.V.:1689.9; NG/GT:1970; IV Piggyback:99.6] Out: 1980 [Urine:1810; Stool:170] Total I/O In: 515.6 [I.V.:416.7; IV Piggyback:99] Out: 465 [Urine:465]  SpO2: 95 % O2 Flow Rate (L/min): 4 L/min FiO2 (%): 40 %  Estimated body mass index is 37.82 kg/m as calculated from the following:   Height as of this encounter: 5' 4.02" (1.626 m).   Weight as of this encounter: 100 kg.  REVIEW OF SYSTEMS  PATIENT IS UNABLE TO PROVIDE COMPLETE REVIEW OF SYSTEM S DUE TO SEVERE CRITICAL ILLNESS AND ENCEPHALOPATHY   PHYSICAL EXAMINATION:  GENERAL:critically ill appearing, +resp distress HEAD: Normocephalic, atraumatic.  EYES: Pupils equal, round, reactive to light.  No scleral icterus.  MOUTH: Moist mucosal membrane. NECK: Supple. No thyromegaly. No nodules. No JVD.  PULMONARY: +rhonchi, +wheezing CARDIOVASCULAR: S1 and S2. Regular rate and rhythm. No murmurs, rubs, or gallops.  GASTROINTESTINAL: Soft, nontender, -distended. Positive bowel sounds.  MUSCULOSKELETAL: No swelling, clubbing, or edema.  NEUROLOGIC: obtunded SKIN:intact,warm,dry     CULTURE RESULTS   Recent Results (from the past 240 hour(s))  Resp Panel by RT-PCR (Flu A&B, Covid) Nasopharyngeal Swab  Status: None   Collection Time: 01/22/20  1:40 PM   Specimen: Nasopharyngeal Swab; Nasopharyngeal(NP) swabs in vial transport medium  Result Value Ref Range Status   SARS Coronavirus 2 by RT PCR NEGATIVE NEGATIVE Final    Comment: (NOTE) SARS-CoV-2 target nucleic acids are NOT DETECTED.  The SARS-CoV-2 RNA is generally detectable in upper  respiratory specimens during the acute phase of infection. The lowest concentration of SARS-CoV-2 viral copies this assay can detect is 138 copies/mL. A negative result does not preclude SARS-Cov-2 infection and should not be used as the sole basis for treatment or other patient management decisions. A negative result may occur with  improper specimen collection/handling, submission of specimen other than nasopharyngeal swab, presence of viral mutation(s) within the areas targeted by this assay, and inadequate number of viral copies(<138 copies/mL). A negative result must be combined with clinical observations, patient history, and epidemiological information. The expected result is Negative.  Fact Sheet for Patients:  EntrepreneurPulse.com.au  Fact Sheet for Healthcare Providers:  IncredibleEmployment.be  This test is no t yet approved or cleared by the [place] FDA and  has been authorized for detection and/or diagnosis of SARS-CoV-2 by FDA under an Emergency Use Authorization (EUA). This EUA will remain  in effect (meaning this test can be used) for the duration of the COVID-19 declaration under Section 564(b)(1) of the Act, 21 U.S.C.section 360bbb-3(b)(1), unless the authorization is terminated  or revoked sooner.       Influenza A by PCR NEGATIVE NEGATIVE Final   Influenza B by PCR NEGATIVE NEGATIVE Final    Comment: (NOTE) The Xpert Xpress SARS-CoV-2/FLU/RSV plus assay is intended as an aid in the diagnosis of influenza from Nasopharyngeal swab specimens and should not be used as a sole basis for treatment. Nasal washings and aspirates are unacceptable for Xpert Xpress SARS-CoV-2/FLU/RSV testing.  Fact Sheet for Patients: EntrepreneurPulse.com.au  Fact Sheet for Healthcare Providers: IncredibleEmployment.be  This test is not yet approved or cleared by the [place] FDA and has been  authorized for detection and/or diagnosis of SARS-CoV-2 by FDA under an Emergency Use Authorization (EUA). This EUA will remain in effect (meaning this test can be used) for the duration of the COVID-19 declaration under Section 564(b)(1) of the Act, 21 U.S.C. section 360bbb-3(b)(1), unless the authorization is terminated or revoked.  Performed at [facility], [address] [place], [place] [address]   MRSA PCR Screening     Status: None   Collection Time: 01/24/20  2:45 AM   Specimen: Nasal Mucosa; Nasopharyngeal  Result Value Ref Range Status   MRSA by PCR NEGATIVE NEGATIVE Final    Comment:        The GeneXpert MRSA Assay (FDA approved for NASAL specimens only), is one component of a comprehensive MRSA colonization surveillance program. It is not intended to diagnose MRSA infection nor to guide or monitor treatment for MRSA infections. Performed at [facility], [address] [place], [place] [address]           CULTURE RESULTS   Recent Results (from the past 240 hour(s))  Resp Panel by RT-PCR (Flu A&B, Covid) Nasopharyngeal Swab     Status: None   Collection Time: 01/22/20  1:40 PM   Specimen: Nasopharyngeal Swab; Nasopharyngeal(NP) swabs in vial transport medium  Result Value Ref Range Status   SARS Coronavirus 2 by RT PCR NEGATIVE NEGATIVE Final    Comment: (NOTE) SARS-CoV-2 target nucleic acids are NOT DETECTED.  The SARS-CoV-2 RNA is  generally detectable in upper respiratory specimens during the acute phase of infection. The lowest concentration of SARS-CoV-2 viral copies this assay can detect is 138 copies/mL. A negative result does not preclude SARS-Cov-2 infection and should not be used as the sole basis for treatment or other patient management decisions. A negative result may occur with  improper specimen collection/handling, submission of specimen other than nasopharyngeal swab, presence of viral mutation(s) within the areas  targeted by this assay, and inadequate number of viral copies(<138 copies/mL). A negative result must be combined with clinical observations, patient history, and epidemiological information. The expected result is Negative.  Fact Sheet for Patients:  EntrepreneurPulse.com.au  Fact Sheet for Healthcare Providers:  IncredibleEmployment.be  This test is no t yet approved or cleared by the [place] FDA and  has been authorized for detection and/or diagnosis of SARS-CoV-2 by FDA under an Emergency Use Authorization (EUA). This EUA will remain  in effect (meaning this test can be used) for the duration of the COVID-19 declaration under Section 564(b)(1) of the Act, 21 U.S.C.section 360bbb-3(b)(1), unless the authorization is terminated  or revoked sooner.       Influenza A by PCR NEGATIVE NEGATIVE Final   Influenza B by PCR NEGATIVE NEGATIVE Final    Comment: (NOTE) The Xpert Xpress SARS-CoV-2/FLU/RSV plus assay is intended as an aid in the diagnosis of influenza from Nasopharyngeal swab specimens and should not be used as a sole basis for treatment. Nasal washings and aspirates are unacceptable for Xpert Xpress SARS-CoV-2/FLU/RSV testing.  Fact Sheet for Patients: EntrepreneurPulse.com.au  Fact Sheet for Healthcare Providers: IncredibleEmployment.be  This test is not yet approved or cleared by the [place] FDA and has been authorized for detection and/or diagnosis of SARS-CoV-2 by FDA under an Emergency Use Authorization (EUA). This EUA will remain in effect (meaning this test can be used) for the duration of the COVID-19 declaration under Section 564(b)(1) of the Act, 21 U.S.C. section 360bbb-3(b)(1), unless the authorization is terminated or revoked.  Performed at [facility], [address] [place], [place] [address]   MRSA PCR Screening     Status: None   Collection Time:  01/24/20  2:45 AM   Specimen: Nasal Mucosa; Nasopharyngeal  Result Value Ref Range Status   MRSA by PCR NEGATIVE NEGATIVE Final    Comment:        The GeneXpert MRSA Assay (FDA approved for NASAL specimens only), is one component of a comprehensive MRSA colonization surveillance program. It is not intended to diagnose MRSA infection nor to guide or monitor treatment for MRSA infections. Performed at [facility], [address], [place], [place] [address]           IMAGING    No results found.   Nutrition Status: Nutrition Problem: Inadequate oral intake Etiology: inability to eat (pt sedated and ventilated) Signs/Symptoms: NPO status       Indwelling Urinary Catheter continued, requirement due to   Reason to continue Indwelling Urinary Catheter strict Intake/Output monitoring for hemodynamic instability   Central Line/ continued, requirement due to  Reason to continue [place] of central venous pressure or other hemodynamic parameters and poor IV access   Ventilator continued, requirement due to severe respiratory failure   Ventilator Sedation RASS 0 to -2      ASSESSMENT AND PLAN SYNOPSIS  50 yo white female with end stage COPD, asthma with progressive resp failure failed biPAP +Cannibus abuse with severe hypoxic and hypercapnic resp failure and severe  COPD exacerbation plan for therapy for CAP, failure to wean from vent Will likely need TRACH  Severe ACUTE Hypoxic and Hypercapnic Respiratory Failure -continue Mechanical Ventilator support -continue Bronchodilator Therapy -Wean Fio2 and PEEP as tolerated -VAP/VENT bundle implementation Plan for SAT/SBT when husband arrives   ACUTE DIASTOLIC CARDIAC FAILURE-  -oxygen as needed -Lasix as tolerated   obesity, possible OSA.   Will certainly impact respiratory mechanics, ventilator weaning Suspect will need to consider additional PEEP  SEVERE COPD EXACERBATION -continue IV  steroids as prescribed -continue NEB THERAPY as prescribed -morphine as needed -wean fio2 as needed and tolerated   ACUTE KIDNEY INJURY/Renal Failure -continue Foley Catheter-assess need -Avoid nephrotoxic agents -Follow urine output, BMP -Ensure adequate renal perfusion, optimize oxygenation -Renal dose medications   NEUROLOGY Acute toxic metabolic encephalopathy, need for sedation Goal RASS -2 to -3    CARDIAC ICU monitoring    DIET-->TF's as tolerated Constipation protocol as indicated  ELECTROLYTES -follow labs as needed -replace as needed -pharmacy consultation and following   DVT/GI PRX ordered and assessed TRANSFUSIONS AS NEEDED MONITOR FSBS I Assessed the need for Labs I Assessed the need for Foley I Assessed the need for Central Venous Line Family Discussion when available I Assessed the need for Mobilization I made an Assessment of medications to be adjusted accordingly Safety Risk assessment completed  CASE DISCUSSED IN MULTIDISCIPLINARY ROUNDS WITH ICU TEAM     Critical Care Time devoted to patient care services described in this note is 55  minutes.   Overall, patient is critically ill, prognosis is guarded.  Patient with Multiorgan failure and at high risk for cardiac arrest and death.    Corrin Parker, M.D.  [facility]  Medical Director [place] Director [facility] Cardio-Pulmonary Department

## 2020-01-31 NOTE — Progress Notes (Signed)
Nutrition Follow-up  DOCUMENTATION CODES:   Obesity unspecified  INTERVENTION:  Initiate new goal TF regimen of Vital AF 1.2 Cal at 40 mL/hr (960 mL goal daily volume) + PROSource TF 90 mL BID per tube. Provides 1312 kcal, 116 grams of protein, 778 mL H2O daily. With current propofol rate provides 1750 kcal daily.  Provide MVI daily per tube.  NUTRITION DIAGNOSIS:   Inadequate oral intake related to inability to eat (pt sedated and ventilated) as evidenced by NPO status.  Ongoing.  GOAL:   Provide needs based on ASPEN/SCCM guidelines  Met with TF regimen.  MONITOR:   Vent status,Labs,Weight trends,Skin,I & O's,TF tolerance  REASON FOR ASSESSMENT:   Ventilator    ASSESSMENT:   50 y.o. female with medical history significant for diverticulitis s/p colostomy, substance abuse, chronic hypoxic hypercapnic respiratory failure on 3.5 L continuous nasal cannula, severe COPD, chronic diastolic heart failure, chronic tobacco abuse and generalized anxiety disorder who is admitted to [facility] on 01/22/2020 with acute on chronic hypoxic hypercapnic respiratory failure in the setting of acute COPD exacerbation and acute on chronic diastolic heart failure  Patient is currently intubated on ventilator support MV: 5.8 L/min Temp (24hrs), Avg:99.1 F (37.3 C), Min:98.1 F (36.7 C), Max:99.8 F (37.7 C)  Propofol: 16.6 ml/hr (438 kcal daily)  Medications reviewed and include: Novolog 0-15 units Q4hrs, Solu-Medrol 40 mg Q12hrs IV, Miralax, famotidine, fentanyl gtt, propofol gtt.  Labs reviewed: CBG 91-126, Chloride 96, CO2 41, BUN 25, Creatinine 0.32.  I/O: 1065 ml UOP yesterday (0.4 mL/kg/hr)  Weight trend: 100 kg on 1/13; +7.8 kg from 1/6  Enteral Access: 18 Fr. OGT placed 1/6; terminates in distal stomach per abdominal x-ray 1/6  Discussed with RN and on rounds. Patient tolerating tube feeds. Plan for trach next week. Due to shortage of Vital High Protein  will have to switch to new tube feed formula.  Diet Order:   Diet Order            Diet NPO time specified  Diet effective now           Diet NPO time specified  Diet effective now                EDUCATION NEEDS:   No education needs have been identified at this time  Skin:  Skin Assessment: Reviewed RN Assessment  Last BM:  01/31/2019 per chart; pt with ostomy  Height:   Ht Readings from Last 1 Encounters:  01/29/20 5' 4.02" (1.626 m)   Weight:   Wt Readings from Last 1 Encounters:  01/31/20 100 kg   Ideal Body Weight:  54.5 kg  BMI:  Body mass index is 37.82 kg/m.  Estimated Nutritional Needs:   Kcal:  1046-1330kcal/day  Protein:  >109g/day  Fluid:  1.6L/day   King, MS, RD, LDN Pager number available on Amion 

## 2020-01-31 NOTE — Progress Notes (Signed)
GOALS OF CARE DISCUSSION  The Clinical status was relayed to family in detail. Husband at [place]  Updated and notified of patients medical condition.  Patient is having a weak cough and struggling to remove secretions.   patient with increased WOB and using accessory muscles to breathe Explained to family course of therapy and the modalities    Family understands the situation.  They have consented and agreed to [facility] CPR, NO CHEST COMRESSIONS, NO SHOCK.  PLAN IS TO [facility] AS TOLERATED SAT/SBT DAILY PLAN FOR TRACH NEXT WEEK  Family are satisfied with Plan of action and management. All questions answered  Additional CC time 32 mins   Fredonia Casalino Patricia Pesa, M.D.  [facility]  Medical Director [place] Director [facility] Cardio-Pulmonary Department

## 2020-02-01 ENCOUNTER — Encounter: Payer: Self-pay | Admitting: Internal Medicine

## 2020-02-01 ENCOUNTER — Encounter: Payer: Self-pay | Admitting: Anesthesiology

## 2020-02-01 LAB — GLUCOSE, CAPILLARY
Glucose-Capillary: 125 mg/dL — ABNORMAL HIGH (ref 70–99)
Glucose-Capillary: 95 mg/dL (ref 70–99)
Glucose-Capillary: 144 mg/dL — ABNORMAL HIGH (ref 70–99)
Glucose-Capillary: 127 mg/dL — ABNORMAL HIGH (ref 70–99)
Glucose-Capillary: 114 mg/dL — ABNORMAL HIGH (ref 70–99)
Glucose-Capillary: 103 mg/dL — ABNORMAL HIGH (ref 70–99)
Glucose-Capillary: 102 mg/dL — ABNORMAL HIGH (ref 70–99)
Glucose-Capillary: 142 mg/dL — ABNORMAL HIGH (ref 70–99)

## 2020-02-01 LAB — RENAL FUNCTION PANEL
Albumin: 2.7 g/dL — ABNORMAL LOW (ref 3.5–5.0)
Anion gap: 8 (ref 5–15)
BUN: 25 mg/dL — ABNORMAL HIGH (ref 6–20)
CO2: 42 mmol/L — ABNORMAL HIGH (ref 22–32)
Calcium: 8.5 mg/dL — ABNORMAL LOW (ref 8.9–10.3)
Chloride: 96 mmol/L — ABNORMAL LOW (ref 98–111)
Creatinine, Ser: 0.3 mg/dL — ABNORMAL LOW (ref 0.44–1.00)
GFR, Estimated: >60 mL/min (ref >60)
Glucose, Bld: 115 mg/dL — ABNORMAL HIGH (ref 70–99)
Phosphorus: 3.6 mg/dL (ref 2.5–4.6)
Potassium: 4.1 mmol/L (ref 3.5–5.1)
Sodium: 146 mmol/L — ABNORMAL HIGH (ref 135–145)

## 2020-02-01 LAB — CBC WITH DIFFERENTIAL/PLATELET
Abs Immature Granulocytes: 0.11 10*3/uL — ABNORMAL HIGH (ref 0.00–0.07)
Basophils Absolute: 0 10*3/uL (ref 0.0–0.1)
Basophils Relative: 0 %
Eosinophils Absolute: 0 10*3/uL (ref 0.0–0.5)
Eosinophils Relative: 0 %
HCT: 32 % — ABNORMAL LOW (ref 36.0–46.0)
Hemoglobin: 9.3 g/dL — ABNORMAL LOW (ref 12.0–15.0)
Immature Granulocytes: 1 %
Lymphocytes Relative: 12 %
Lymphs Abs: 1.3 10*3/uL (ref 0.7–4.0)
MCH: 30 pg (ref 26.0–34.0)
MCHC: 29.1 g/dL — ABNORMAL LOW (ref 30.0–36.0)
MCV: 103.2 fL — ABNORMAL HIGH (ref 80.0–100.0)
Monocytes Absolute: 0.8 10*3/uL (ref 0.1–1.0)
Monocytes Relative: 7 %
Neutro Abs: 8.2 10*3/uL — ABNORMAL HIGH (ref 1.7–7.7)
Neutrophils Relative %: 80 %
Platelets: 203 10*3/uL (ref 150–400)
RBC: 3.1 MIL/uL — ABNORMAL LOW (ref 3.87–5.11)
RDW: 12 % (ref 11.5–15.5)
WBC: 10.4 10*3/uL (ref 4.0–10.5)
nRBC: 0.3 % — ABNORMAL HIGH (ref 0.0–0.2)

## 2020-02-01 LAB — TRIGLYCERIDES: Triglycerides: 102 mg/dL (ref <150)

## 2020-02-01 LAB — MAGNESIUM: Magnesium: 2 mg/dL (ref 1.7–2.4)

## 2020-02-01 MED ORDER — FUROSEMIDE 10 MG/ML IJ SOLN
60.0000 mg | Freq: Once | INTRAMUSCULAR | Status: AC
  Administered 2020-02-01: 60 mg via INTRAVENOUS
  Filled 2020-02-01: qty 6

## 2020-02-01 NOTE — Anesthesia Preprocedure Evaluation (Deleted)
Anesthesia Evaluation  Patient identified by MRN, date of birth, ID band Patient confused    Reviewed: Allergy & Precautions, H&P , NPO status , Patient's Chart, lab work & pertinent test results  History of Anesthesia Complications (+) PROLONGED EMERGENCE and history of anesthetic complications  Airway Mallampati: Intubated  TM Distance: >3 FB Neck ROM: limited    Dental  (+) Poor Dentition, Missing   Pulmonary shortness of breath, sleep apnea , COPD, Current Smoker,    Pulmonary exam normal        Cardiovascular Exercise Tolerance: Poor hypertension, +CHF  Normal cardiovascular exam     Neuro/Psych PSYCHIATRIC DISORDERS negative neurological ROS     GI/Hepatic Neg liver ROS, GERD  ,  Endo/Other  negative endocrine ROS  Renal/GU Renal disease     Musculoskeletal   Abdominal   Peds  Hematology negative hematology ROS (+)   Anesthesia Other Findings Past Medical History: 05/12/2018: Cardiac arrest ([place]) No date: CHF (congestive heart failure) (HCC) No date: COPD (chronic obstructive pulmonary disease) (HCC) No date: Current smoker No date: Dyspnea No date: GERD (gastroesophageal reflux disease) No date: Opiate abuse, continuous ([place])  Past Surgical History: No date: ABDOMINAL HYSTERECTOMY No date: BACK SURGERY No date: cesction 01/13/2018: COLOSTOMY     Comment:  Procedure: COLOSTOMY Creation;  Surgeon: Jules Husbands,              MD;  Location: [facility] ORS;  Service: General;; No date: INCISIONAL HERNIA REPAIR     Comment:  lower midline laparotomy incision (hysterectomy),               repaired with mesh 05/26/2018: IR FLUORO GUIDE CV LINE RIGHT 06/02/2018: IR REMOVAL TUN CV CATH W/O FL 05/26/2018: IR US GUIDE VASC ACCESS RIGHT 2012: LAPAROSCOPIC CHOLECYSTECTOMY 01/13/2018: LAPAROSCOPIC LYSIS OF ADHESIONS     Comment:  Procedure: LAPAROSCOPIC LYSIS OF ADHESIONS;  Surgeon:               Jules Husbands, MD;   Location: [facility] ORS;  Service:               General;; 01/13/2018: LAPAROSCOPIC SIGMOID COLECTOMY     Comment:  Procedure: LAPAROSCOPIC SIGMOID COLECTOMY;  Surgeon:               Jules Husbands, MD;  Location: [facility] ORS;  Service:               General;; 08/25/2015: ORIF WRIST FRACTURE; Right     Comment:  Procedure: OPEN REDUCTION INTERNAL FIXATION (ORIF) WRIST              FRACTURE;  Surgeon: Corky Mull, MD;  Location: [facility]               ORS;  Service: Orthopedics;  Laterality: Right;  BMI    Body Mass Index: 37.60 kg/m      Reproductive/Obstetrics negative OB ROS                            Anesthesia Physical Anesthesia Plan  ASA: IV  Anesthesia Plan: General ETT   Post-op Pain Management:    Induction: Intravenous  PONV Risk Score and Plan: Ondansetron, Dexamethasone, Midazolam and Treatment may vary due to age or medical condition  Airway Management Planned: Oral ETT  Additional Equipment:   Intra-op Plan:   Post-operative Plan: Post-operative intubation/ventilation  Informed Consent: I have reviewed the patients History and Physical, chart,  labs and discussed the procedure including the risks, benefits and alternatives for the proposed anesthesia with the patient or authorized representative who has indicated his/her understanding and acceptance.   Patient has DNR.  Suspend DNR.   Dental Advisory Given  Plan Discussed with: Anesthesiologist, CRNA and Surgeon  Anesthesia Plan Comments: (History and consent from the patients daughter  Daughter consented for risks of anesthesia including but not limited to:  - adverse reactions to medications - damage to eyes, teeth, lips or other oral mucosa - nerve damage due to positioning  - sore throat or hoarseness - Damage to heart, brain, nerves, lungs, other parts of body or loss of life  She voiced understanding.)       Anesthesia Quick Evaluation

## 2020-02-01 NOTE — Progress Notes (Signed)
CRITICAL CARE NOTE 50 y.o. Female admitted 01/22/20 with Acute on Chronic Hypoxic Hypercapnic Respiratory Failure in the setting of Acute COPD Exacerbation & Acute on Chronic HFpEF requiring BiPAP.   Significant Hospital Events:Hospital Course:  1/4: Admission to Stepdown1/6: Increased work of breathingon BiPAP, PCCM consulted, high risk for intubation 01/24/20 in the Stepdown unit, she is noted to have respiratory distress and increased work of breathing in BiPAP. PCCM is consulted for assistance in management. She is high risk for intubation. 1/6 intubated On MV support, 8.5 ETT placed Left CVL placed 1/7 remains on vent, severe COPD 1/8 failed weaning trial 1/10 failed multiple weaning trials 1/11 failing weaning trials 1/12 discussed plan fo care with Husband/Daughter 02/01/20- SBT today, patient with chronic CO2 retention.    Significant Diagnostic Tests:  1/4: CXR>>Cardiomediastinal silhouette unchanged in size and contour. Fullness in the central vasculature. Interlobular septal thickening with reticular opacities. No pneumothorax or large pleural effusion.No confluent airspace disease. Concerning for Acute CHF    CC  follow up respiratory failure  SUBJECTIVE Patient remains critically ill Prognosis is guarded Severe end stage COPD   Vent Mode: Spontaneous FiO2 (%):  [35 %] 35 % Set Rate:  [12 bmp] 12 bmp Vt Set:  [400 mL] 400 mL PEEP:  [5 cmH20] 5 cmH20 Pressure Support:  [10 cmH20] 10 cmH20  BP 137/77   Pulse (!) 55   Temp 98.7 F (37.1 C) (Oral)   Resp 14   Ht 5' 4.02" (1.626 m)   Wt 99.4 kg   SpO2 97%   BMI 37.60 kg/m    I/O last 3 completed shifts: In: 2168.7 [I.V.:1359.7; NG/GT:660; IV Piggyback:149] Out: 9379 [Urine:1570; Stool:200] Total I/O In: -  Out: 170 [Urine:170]  SpO2: 97 % O2 Flow Rate (L/min): 4 L/min FiO2 (%): 35 %  Estimated body mass index is 37.6 kg/m as calculated from the following:   Height as of this encounter: 5'  4.02" (1.626 m).   Weight as of this encounter: 99.4 kg.  CRITICAL CARE NOTE  CC  follow up respiratory failure  SUBJECTIVE Patient remains critically ill Prognosis is guarded   BP 137/77   Pulse (!) 55   Temp 98.7 F (37.1 C) (Oral)   Resp 14   Ht 5' 4.02" (1.626 m)   Wt 99.4 kg   SpO2 97%   BMI 37.60 kg/m    I/O last 3 completed shifts: In: 2168.7 [I.V.:1359.7; NG/GT:660; IV Piggyback:149] Out: 0240 [Urine:1570; Stool:200] Total I/O In: -  Out: 170 [Urine:170]  SpO2: 97 % O2 Flow Rate (L/min): 4 L/min FiO2 (%): 35 %  Estimated body mass index is 37.6 kg/m as calculated from the following:   Height as of this encounter: 5' 4.02" (1.626 m).   Weight as of this encounter: 99.4 kg.  REVIEW OF SYSTEMS  PATIENT IS UNABLE TO PROVIDE COMPLETE REVIEW OF SYSTEM S DUE TO SEVERE CRITICAL ILLNESS AND ENCEPHALOPATHY   PHYSICAL EXAMINATION:  GENERAL:critically ill appearing, +resp distress HEAD: Normocephalic, atraumatic.  EYES: Pupils equal, round, reactive to light.  No scleral icterus.  MOUTH: Moist mucosal membrane. NECK: Supple. No thyromegaly. No nodules. No JVD.  PULMONARY: +rhonchi,  CARDIOVASCULAR: S1 and S2. Regular rate and rhythm. No murmurs, rubs, or gallops.  GASTROINTESTINAL: Soft, nontender, -distended. Positive bowel sounds.  MUSCULOSKELETAL: No swelling, clubbing, or edema.  NEUROLOGIC: obtunded SKIN:intact,warm,dry     CULTURE RESULTS   Recent Results (from the past 240 hour(s))  Resp Panel by RT-PCR (Flu A&B, Covid) Nasopharyngeal Swab  Status: None   Collection Time: 01/22/20  1:40 PM   Specimen: Nasopharyngeal Swab; Nasopharyngeal(NP) swabs in vial transport medium  Result Value Ref Range Status   SARS Coronavirus 2 by RT PCR NEGATIVE NEGATIVE Final    Comment: (NOTE) SARS-CoV-2 target nucleic acids are NOT DETECTED.  The SARS-CoV-2 RNA is generally detectable in upper respiratory specimens during the acute phase of infection. The  lowest concentration of SARS-CoV-2 viral copies this assay can detect is 138 copies/mL. A negative result does not preclude SARS-Cov-2 infection and should not be used as the sole basis for treatment or other patient management decisions. A negative result may occur with  improper specimen collection/handling, submission of specimen other than nasopharyngeal swab, presence of viral mutation(s) within the areas targeted by this assay, and inadequate number of viral copies(<138 copies/mL). A negative result must be combined with clinical observations, patient history, and epidemiological information. The expected result is Negative.  Fact Sheet for Patients:  EntrepreneurPulse.com.au  Fact Sheet for Healthcare Providers:  IncredibleEmployment.be  This test is no t yet approved or cleared by the [place] FDA and  has been authorized for detection and/or diagnosis of SARS-CoV-2 by FDA under an Emergency Use Authorization (EUA). This EUA will remain  in effect (meaning this test can be used) for the duration of the COVID-19 declaration under Section 564(b)(1) of the Act, 21 U.S.C.section 360bbb-3(b)(1), unless the authorization is terminated  or revoked sooner.       Influenza A by PCR NEGATIVE NEGATIVE Final   Influenza B by PCR NEGATIVE NEGATIVE Final    Comment: (NOTE) The Xpert Xpress SARS-CoV-2/FLU/RSV plus assay is intended as an aid in the diagnosis of influenza from Nasopharyngeal swab specimens and should not be used as a sole basis for treatment. Nasal washings and aspirates are unacceptable for Xpert Xpress SARS-CoV-2/FLU/RSV testing.  Fact Sheet for Patients: EntrepreneurPulse.com.au  Fact Sheet for Healthcare Providers: IncredibleEmployment.be  This test is not yet approved or cleared by the [place] FDA and has been authorized for detection and/or diagnosis of SARS-CoV-2 by FDA under  an Emergency Use Authorization (EUA). This EUA will remain in effect (meaning this test can be used) for the duration of the COVID-19 declaration under Section 564(b)(1) of the Act, 21 U.S.C. section 360bbb-3(b)(1), unless the authorization is terminated or revoked.  Performed at [facility], [address] [place], [place] [address]   MRSA PCR Screening     Status: None   Collection Time: 01/24/20  2:45 AM   Specimen: Nasal Mucosa; Nasopharyngeal  Result Value Ref Range Status   MRSA by PCR NEGATIVE NEGATIVE Final    Comment:        The GeneXpert MRSA Assay (FDA approved for NASAL specimens only), is one component of a comprehensive MRSA colonization surveillance program. It is not intended to diagnose MRSA infection nor to guide or monitor treatment for MRSA infections. Performed at [facility], [address] [place],  [address]           CULTURE RESULTS   Recent Results (from the past 240 hour(s))  Resp Panel by RT-PCR (Flu A&B, Covid) Nasopharyngeal Swab     Status: None   Collection Time: 01/22/20  1:40 PM   Specimen: Nasopharyngeal Swab; Nasopharyngeal(NP) swabs in vial transport medium  Result Value Ref Range Status   SARS Coronavirus 2 by RT PCR NEGATIVE NEGATIVE Final    Comment: (NOTE) SARS-CoV-2 target nucleic acids are NOT DETECTED.  The SARS-CoV-2 RNA is  generally detectable in upper respiratory specimens during the acute phase of infection. The lowest concentration of SARS-CoV-2 viral copies this assay can detect is 138 copies/mL. A negative result does not preclude SARS-Cov-2 infection and should not be used as the sole basis for treatment or other patient management decisions. A negative result may occur with  improper specimen collection/handling, submission of specimen other than nasopharyngeal swab, presence of viral mutation(s) within the areas targeted by this assay, and inadequate number of viral copies(<138  copies/mL). A negative result must be combined with clinical observations, patient history, and epidemiological information. The expected result is Negative.  Fact Sheet for Patients:  EntrepreneurPulse.com.au  Fact Sheet for Healthcare Providers:  IncredibleEmployment.be  This test is no t yet approved or cleared by the [place] FDA and  has been authorized for detection and/or diagnosis of SARS-CoV-2 by FDA under an Emergency Use Authorization (EUA). This EUA will remain  in effect (meaning this test can be used) for the duration of the COVID-19 declaration under Section 564(b)(1) of the Act, 21 U.S.C.section 360bbb-3(b)(1), unless the authorization is terminated  or revoked sooner.       Influenza A by PCR NEGATIVE NEGATIVE Final   Influenza B by PCR NEGATIVE NEGATIVE Final    Comment: (NOTE) The Xpert Xpress SARS-CoV-2/FLU/RSV plus assay is intended as an aid in the diagnosis of influenza from Nasopharyngeal swab specimens and should not be used as a sole basis for treatment. Nasal washings and aspirates are unacceptable for Xpert Xpress SARS-CoV-2/FLU/RSV testing.  Fact Sheet for Patients: EntrepreneurPulse.com.au  Fact Sheet for Healthcare Providers: IncredibleEmployment.be  This test is not yet approved or cleared by the [place] FDA and has been authorized for detection and/or diagnosis of SARS-CoV-2 by FDA under an Emergency Use Authorization (EUA). This EUA will remain in effect (meaning this test can be used) for the duration of the COVID-19 declaration under Section 564(b)(1) of the Act, 21 U.S.C. section 360bbb-3(b)(1), unless the authorization is terminated or revoked.  Performed at [facility], [address] [place], [place] [address]   MRSA PCR Screening     Status: None   Collection Time: 01/24/20  2:45 AM   Specimen: Nasal Mucosa; Nasopharyngeal  Result Value  Ref Range Status   MRSA by PCR NEGATIVE NEGATIVE Final    Comment:        The GeneXpert MRSA Assay (FDA approved for NASAL specimens only), is one component of a comprehensive MRSA colonization surveillance program. It is not intended to diagnose MRSA infection nor to guide or monitor treatment for MRSA infections. Performed at [facility], [address], [place], [place] [address]           IMAGING    No results found.   Nutrition Status: Nutrition Problem: Inadequate oral intake Etiology: inability to eat (pt sedated and ventilated) Signs/Symptoms: NPO status       Indwelling Urinary Catheter continued, requirement due to   Reason to continue Indwelling Urinary Catheter strict Intake/Output monitoring for hemodynamic instability   Central Line/ continued, requirement due to  Reason to continue Watson of central venous pressure or other hemodynamic parameters and poor IV access   Ventilator continued, requirement due to severe respiratory failure   Ventilator Sedation RASS 0 to -2      ASSESSMENT AND PLAN SYNOPSIS  50 yo white female with end stage COPD, asthma with progressive resp failure failed biPAP +Cannibus abuse with severe hypoxic and hypercapnic resp failure and severe  COPD exacerbation plan for therapy for CAP, failure to wean from vent Will likely need TRACH  Severe ACUTE Hypoxic and Hypercapnic Respiratory Failure -continue Mechanical Ventilator support -continue Bronchodilator Therapy -Wean Fio2 and PEEP as tolerated -VAP/VENT bundle implementation Plan for SAT/SBT when husband arrives   ACUTE DIASTOLIC CARDIAC FAILURE-  -oxygen as needed -Lasix as tolerated   obesity, possible OSA.   Will certainly impact respiratory mechanics, ventilator weaning Suspect will need to consider additional PEEP  SEVERE COPD EXACERBATION -continue IV steroids as prescribed -continue NEB THERAPY as prescribed -morphine as  needed -wean fio2 as needed and tolerated   ACUTE KIDNEY INJURY/Renal Failure -continue Foley Catheter-assess need -Avoid nephrotoxic agents -Follow urine output, BMP -Ensure adequate renal perfusion, optimize oxygenation -Renal dose medications   NEUROLOGY Acute toxic metabolic encephalopathy, need for sedation Goal RASS -2 to -3    CARDIAC ICU monitoring    DIET-->TF's as tolerated Constipation protocol as indicated  ELECTROLYTES -follow labs as needed -replace as needed -pharmacy consultation and following   DVT/GI PRX ordered and assessed TRANSFUSIONS AS NEEDED MONITOR FSBS I Assessed the need for Labs I Assessed the need for Foley I Assessed the need for Central Venous Line Family Discussion when available I Assessed the need for Mobilization I made an Assessment of medications to be adjusted accordingly Safety Risk assessment completed  CASE DISCUSSED IN MULTIDISCIPLINARY ROUNDS WITH ICU TEAM     Critical Care Time devoted to patient care services described in this note is 33 minutes.   Overall, patient is critically ill, prognosis is guarded.  Patient with Multiorgan failure and at high risk for cardiac arrest and death.     Ottie Glazier, M.D.  Pulmonary & [place]

## 2020-02-02 LAB — CBC WITH DIFFERENTIAL/PLATELET
Abs Immature Granulocytes: 0.08 10*3/uL — ABNORMAL HIGH (ref 0.00–0.07)
Basophils Absolute: 0 10*3/uL (ref 0.0–0.1)
Basophils Relative: 0 %
Eosinophils Absolute: 0.1 10*3/uL (ref 0.0–0.5)
Eosinophils Relative: 0 %
HCT: 35 % — ABNORMAL LOW (ref 36.0–46.0)
Hemoglobin: 10.2 g/dL — ABNORMAL LOW (ref 12.0–15.0)
Immature Granulocytes: 1 %
Lymphocytes Relative: 22 %
Lymphs Abs: 2.5 10*3/uL (ref 0.7–4.0)
MCH: 29.8 pg (ref 26.0–34.0)
MCHC: 29.1 g/dL — ABNORMAL LOW (ref 30.0–36.0)
MCV: 102.3 fL — ABNORMAL HIGH (ref 80.0–100.0)
Monocytes Absolute: 1 10*3/uL (ref 0.1–1.0)
Monocytes Relative: 9 %
Neutro Abs: 7.7 10*3/uL (ref 1.7–7.7)
Neutrophils Relative %: 68 %
Platelets: 213 10*3/uL (ref 150–400)
RBC: 3.42 MIL/uL — ABNORMAL LOW (ref 3.87–5.11)
RDW: 12.1 % (ref 11.5–15.5)
WBC: 11.3 10*3/uL — ABNORMAL HIGH (ref 4.0–10.5)
nRBC: 0.3 % — ABNORMAL HIGH (ref 0.0–0.2)

## 2020-02-02 LAB — RENAL FUNCTION PANEL
Albumin: 2.8 g/dL — ABNORMAL LOW (ref 3.5–5.0)
Anion gap: 8 (ref 5–15)
BUN: 30 mg/dL — ABNORMAL HIGH (ref 6–20)
CO2: 47 mmol/L — ABNORMAL HIGH (ref 22–32)
Calcium: 9.2 mg/dL (ref 8.9–10.3)
Chloride: 91 mmol/L — ABNORMAL LOW (ref 98–111)
Creatinine, Ser: 0.34 mg/dL — ABNORMAL LOW (ref 0.44–1.00)
GFR, Estimated: >60 mL/min (ref >60)
Glucose, Bld: 136 mg/dL — ABNORMAL HIGH (ref 70–99)
Phosphorus: 4 mg/dL (ref 2.5–4.6)
Potassium: 3.4 mmol/L — ABNORMAL LOW (ref 3.5–5.1)
Sodium: 146 mmol/L — ABNORMAL HIGH (ref 135–145)

## 2020-02-02 LAB — MAGNESIUM: Magnesium: 1.8 mg/dL (ref 1.7–2.4)

## 2020-02-02 LAB — TRIGLYCERIDES: Triglycerides: 114 mg/dL (ref <150)

## 2020-02-02 LAB — GLUCOSE, CAPILLARY
Glucose-Capillary: 148 mg/dL — ABNORMAL HIGH (ref 70–99)
Glucose-Capillary: 151 mg/dL — ABNORMAL HIGH (ref 70–99)
Glucose-Capillary: 156 mg/dL — ABNORMAL HIGH (ref 70–99)
Glucose-Capillary: 84 mg/dL (ref 70–99)
Glucose-Capillary: 90 mg/dL (ref 70–99)
Glucose-Capillary: 99 mg/dL (ref 70–99)

## 2020-02-02 NOTE — Progress Notes (Signed)
CRITICAL CARE NOTE 50 y.o. Female admitted 01/22/20 with Acute on Chronic Hypoxic Hypercapnic Respiratory Failure in the setting of Acute COPD Exacerbation & Acute on Chronic HFpEF requiring BiPAP.   Significant Hospital Events:Hospital Course:  1/4: Admission to Stepdown1/6: Increased work of breathingon BiPAP, PCCM consulted, high risk for intubation 01/24/20 in the Stepdown unit, she is noted to have respiratory distress and increased work of breathing in BiPAP. PCCM is consulted for assistance in management. She is high risk for intubation. 1/6 intubated On MV support, 8.5 ETT placed Left CVL placed 1/7 remains on vent, severe COPD 1/8 failed weaning trial 1/10 failed multiple weaning trials 1/11 failing weaning trials 1/12 discussed plan fo care with Husband/Daughter 02/01/20- SBT today, patient with chronic CO2 retention.  02/02/20- for SBT again today, patient failed SBT yesterday.    Significant Diagnostic Tests:  1/4: CXR>>Cardiomediastinal silhouette unchanged in size and contour. Fullness in the central vasculature. Interlobular septal thickening with reticular opacities. No pneumothorax or large pleural effusion.No confluent airspace disease. Concerning for Acute CHF    CC  follow up respiratory failure  SUBJECTIVE Patient remains critically ill Prognosis is guarded Severe end stage COPD   Vent Mode: PSV FiO2 (%):  [35 %] 35 % Set Rate:  [12 bmp] 12 bmp Vt Set:  [400 mL] 400 mL PEEP:  [5 cmH20] 5 cmH20 Pressure Support:  [10 cmH20] 10 cmH20  BP (!) 129/54   Pulse 70   Temp 98 F (36.7 C) (Oral)   Resp 13   Ht 5' 4.02" (1.626 m)   Wt 97.7 kg   SpO2 (!) 89%   BMI 36.95 kg/m    I/O last 3 completed shifts: In: 2998.1 [I.V.:1518.1; NG/GT:1480] Out: 0973 [Urine:3470; Stool:275] Total I/O In: 151.4 [I.V.:71.4; NG/GT:80] Out: 180 [Urine:180]  SpO2: (!) 89 % O2 Flow Rate (L/min): 4 L/min FiO2 (%): 35 %  Estimated body mass index is 36.95 kg/m as  calculated from the following:   Height as of this encounter: 5' 4.02" (1.626 m).   Weight as of this encounter: 97.7 kg.  CRITICAL CARE NOTE  CC  follow up respiratory failure  SUBJECTIVE Patient remains critically ill Prognosis is guarded   BP (!) 129/54   Pulse 70   Temp 98 F (36.7 C) (Oral)   Resp 13   Ht 5' 4.02" (1.626 m)   Wt 97.7 kg   SpO2 (!) 89%   BMI 36.95 kg/m    I/O last 3 completed shifts: In: 2998.1 [I.V.:1518.1; NG/GT:1480] Out: 5329 [Urine:3470; Stool:275] Total I/O In: 151.4 [I.V.:71.4; NG/GT:80] Out: 180 [Urine:180]  SpO2: (!) 89 % O2 Flow Rate (L/min): 4 L/min FiO2 (%): 35 %  Estimated body mass index is 36.95 kg/m as calculated from the following:   Height as of this encounter: 5' 4.02" (1.626 m).   Weight as of this encounter: 97.7 kg.  REVIEW OF SYSTEMS  PATIENT IS UNABLE TO PROVIDE COMPLETE REVIEW OF SYSTEM S DUE TO SEVERE CRITICAL ILLNESS AND ENCEPHALOPATHY   PHYSICAL EXAMINATION:  GENERAL:critically ill appearing, +resp distress HEAD: Normocephalic, atraumatic.  EYES: Pupils equal, round, reactive to light.  No scleral icterus.  MOUTH: Moist mucosal membrane. NECK: Supple. No thyromegaly. No nodules. No JVD.  PULMONARY: +rhonchi, decreased air entry bilaterally  CARDIOVASCULAR: S1 and S2. Regular rate and rhythm. No murmurs, rubs, or gallops.  GASTROINTESTINAL: Soft, nontender, -distended. Positive bowel sounds.  MUSCULOSKELETAL: No swelling, clubbing, or edema.  NEUROLOGIC: obtunded SKIN:intact,warm,dry     CULTURE RESULTS   Recent  Results (from the past 240 hour(s))  MRSA PCR Screening     Status: None   Collection Time: 01/24/20  2:45 AM   Specimen: Nasal Mucosa; Nasopharyngeal  Result Value Ref Range Status   MRSA by PCR NEGATIVE NEGATIVE Final    Comment:        The GeneXpert MRSA Assay (FDA approved for NASAL specimens only), is one component of a comprehensive MRSA colonization surveillance program. It is  not intended to diagnose MRSA infection nor to guide or monitor treatment for MRSA infections. Performed at [facility], [place], [place] [address]           CULTURE RESULTS   Recent Results (from the past 240 hour(s))  MRSA PCR Screening     Status: None   Collection Time: 01/24/20  2:45 AM   Specimen: Nasal Mucosa; Nasopharyngeal  Result Value Ref Range Status   MRSA by PCR NEGATIVE NEGATIVE Final    Comment:        The GeneXpert MRSA Assay (FDA approved for NASAL specimens only), is one component of a comprehensive MRSA colonization surveillance program. It is not intended to diagnose MRSA infection nor to guide or monitor treatment for MRSA infections. Performed at [facility], [address] [place], [place] [address]           IMAGING    No results found.   Nutrition Status: Nutrition Problem: Inadequate oral intake Etiology: inability to eat (pt sedated and ventilated) Signs/Symptoms: NPO status       Indwelling Urinary Catheter continued, requirement due to   Reason to continue Indwelling Urinary Catheter strict Intake/Output monitoring for hemodynamic instability   Central Line/ continued, requirement due to  Reason to continue [place] of central venous pressure or other hemodynamic parameters and poor IV access   Ventilator continued, requirement due to severe respiratory failure   Ventilator Sedation RASS 0 to -2      ASSESSMENT AND PLAN SYNOPSIS  50 yo white female with end stage COPD, asthma with progressive resp failure failed biPAP +Cannibus abuse with severe hypoxic and hypercapnic resp failure and severe COPD exacerbation plan for therapy for CAP, failure to wean from vent Will likely need TRACH  Severe ACUTE Hypoxic and Hypercapnic Respiratory Failure -continue Mechanical Ventilator support -continue Bronchodilator Therapy -Wean Fio2 and PEEP as tolerated -VAP/VENT bundle  implementation Plan for SAT/SBT when husband arrives   ACUTE DIASTOLIC CARDIAC FAILURE-  -oxygen as needed -Lasix as tolerated   obesity, possible OSA.   Will certainly impact respiratory mechanics, ventilator weaning Suspect will need to consider additional PEEP  SEVERE COPD EXACERBATION -continue IV steroids as prescribed -continue NEB THERAPY as prescribed -morphine as needed -wean fio2 as needed and tolerated   ACUTE KIDNEY INJURY/Renal Failure -continue Foley Catheter-assess need -Avoid nephrotoxic agents -Follow urine output, BMP -Ensure adequate renal perfusion, optimize oxygenation -Renal dose medications   NEUROLOGY Acute toxic metabolic encephalopathy, need for sedation Goal RASS -2 to -3    CARDIAC ICU monitoring    DIET-->TF's as tolerated Constipation protocol as indicated  ELECTROLYTES -follow labs as needed -replace as needed -pharmacy consultation and following   DVT/GI PRX ordered and assessed TRANSFUSIONS AS NEEDED MONITOR FSBS I Assessed the need for Labs I Assessed the need for Foley I Assessed the need for Central Venous Line Family Discussion when available I Assessed the need for Mobilization I made an Assessment of medications to be adjusted accordingly Safety Risk assessment completed  CASE DISCUSSED IN MULTIDISCIPLINARY ROUNDS WITH ICU TEAM     Critical Care Time devoted to patient care services described in this note is 33 minutes.   Overall, patient is critically ill, prognosis is guarded.  Patient with Multiorgan failure and at high risk for cardiac arrest and death.     Ottie Glazier, M.D.  Pulmonary & [place]

## 2020-02-03 ENCOUNTER — Inpatient Hospital Stay: Payer: 59

## 2020-02-03 LAB — BLOOD GAS, ARTERIAL
Acid-Base Excess: 27.2 mmol/L — ABNORMAL HIGH (ref 0.0–2.0)
Allens test (pass/fail): POSITIVE — AB
Bicarbonate: 53.4 mmol/L — ABNORMAL HIGH (ref 20.0–28.0)
Delivery systems: POSITIVE
Expiratory PAP: 5
FIO2: 0.4
Inspiratory PAP: 10
O2 Saturation: 97.5 %
Patient temperature: 37
RATE: 8 resp/min
pCO2 arterial: 61 mmHg — ABNORMAL HIGH (ref 32.0–48.0)
pH, Arterial: 7.55 — ABNORMAL HIGH (ref 7.350–7.450)
pO2, Arterial: 84 mmHg (ref 83.0–108.0)

## 2020-02-03 LAB — RENAL FUNCTION PANEL
Albumin: 2.4 g/dL — ABNORMAL LOW (ref 3.5–5.0)
Anion gap: 9 (ref 5–15)
BUN: 28 mg/dL — ABNORMAL HIGH (ref 6–20)
CO2: 46 mmol/L — ABNORMAL HIGH (ref 22–32)
Calcium: 9 mg/dL (ref 8.9–10.3)
Chloride: 88 mmol/L — ABNORMAL LOW (ref 98–111)
Creatinine, Ser: <0.3 mg/dL — ABNORMAL LOW (ref 0.44–1.00)
Glucose, Bld: 109 mg/dL — ABNORMAL HIGH (ref 70–99)
Phosphorus: 4.2 mg/dL (ref 2.5–4.6)
Potassium: 3.8 mmol/L (ref 3.5–5.1)
Sodium: 143 mmol/L (ref 135–145)

## 2020-02-03 LAB — CBC WITH DIFFERENTIAL/PLATELET
Abs Immature Granulocytes: 0.06 10*3/uL (ref 0.00–0.07)
Basophils Absolute: 0 10*3/uL (ref 0.0–0.1)
Basophils Relative: 0 %
Eosinophils Absolute: 0.1 10*3/uL (ref 0.0–0.5)
Eosinophils Relative: 1 %
HCT: 31.6 % — ABNORMAL LOW (ref 36.0–46.0)
Hemoglobin: 9.1 g/dL — ABNORMAL LOW (ref 12.0–15.0)
Immature Granulocytes: 1 %
Lymphocytes Relative: 14 %
Lymphs Abs: 1.3 10*3/uL (ref 0.7–4.0)
MCH: 29.5 pg (ref 26.0–34.0)
MCHC: 28.8 g/dL — ABNORMAL LOW (ref 30.0–36.0)
MCV: 102.6 fL — ABNORMAL HIGH (ref 80.0–100.0)
Monocytes Absolute: 0.7 10*3/uL (ref 0.1–1.0)
Monocytes Relative: 7 %
Neutro Abs: 7.5 10*3/uL (ref 1.7–7.7)
Neutrophils Relative %: 77 %
Platelets: 206 10*3/uL (ref 150–400)
RBC: 3.08 MIL/uL — ABNORMAL LOW (ref 3.87–5.11)
RDW: 12.4 % (ref 11.5–15.5)
WBC: 9.6 10*3/uL (ref 4.0–10.5)
nRBC: 0.2 % (ref 0.0–0.2)

## 2020-02-03 LAB — GLUCOSE, CAPILLARY
Glucose-Capillary: 109 mg/dL — ABNORMAL HIGH (ref 70–99)
Glucose-Capillary: 98 mg/dL (ref 70–99)
Glucose-Capillary: 126 mg/dL — ABNORMAL HIGH (ref 70–99)
Glucose-Capillary: 104 mg/dL — ABNORMAL HIGH (ref 70–99)
Glucose-Capillary: 92 mg/dL (ref 70–99)

## 2020-02-03 LAB — MAGNESIUM: Magnesium: 1.9 mg/dL (ref 1.7–2.4)

## 2020-02-03 LAB — TRIGLYCERIDES: Triglycerides: 84 mg/dL (ref <150)

## 2020-02-03 MED ORDER — AZITHROMYCIN 500 MG PO TABS
500.0000 mg | ORAL_TABLET | Freq: Every day | ORAL | Status: DC
  Filled 2020-02-03 (×2): qty 1

## 2020-02-03 MED ORDER — CHLORHEXIDINE GLUCONATE 0.12 % MT SOLN
15.0000 mL | Freq: Two times a day (BID) | OROMUCOSAL | Status: DC
  Administered 2020-02-03 – 2020-02-10 (×14): 15 mL via OROMUCOSAL
  Filled 2020-02-03 (×16): qty 15

## 2020-02-03 MED ORDER — ORAL CARE MOUTH RINSE
15.0000 mL | Freq: Two times a day (BID) | OROMUCOSAL | Status: DC
  Administered 2020-02-04 – 2020-02-11 (×9): 15 mL via OROMUCOSAL

## 2020-02-03 NOTE — Progress Notes (Signed)
Pt. Extubated to bipap . Pt. Is tolerating bipap well.

## 2020-02-03 NOTE — Progress Notes (Signed)
CRITICAL CARE NOTE 50 y.o. Female admitted 01/22/20 with Acute on Chronic Hypoxic Hypercapnic Respiratory Failure in the setting of Acute COPD Exacerbation & Acute on Chronic HFpEF requiring BiPAP.   Significant Hospital Events:Hospital Course:  1/4: Admission to Stepdown1/6: Increased work of breathingon BiPAP, PCCM consulted, high risk for intubation 01/24/20 in the Stepdown unit, she is noted to have respiratory distress and increased work of breathing in BiPAP. PCCM is consulted for assistance in management. She is high risk for intubation. 1/6 intubated On MV support, 8.5 ETT placed Left CVL placed 1/7 remains on vent, severe COPD 1/8 failed weaning trial 1/10 failed multiple weaning trials 1/11 failing weaning trials 1/12 discussed plan fo care with Husband/Daughter 02/01/20- SBT today, patient with chronic CO2 retention.  02/02/20- for SBT again today, patient failed SBT yesterday.  02/03/20- patient is improved, passing SBT with plan to liberate from MV today.    Significant Diagnostic Tests:  1/4: CXR>>Cardiomediastinal silhouette unchanged in size and contour. Fullness in the central vasculature. Interlobular septal thickening with reticular opacities. No pneumothorax or large pleural effusion.No confluent airspace disease. Concerning for Acute CHF    CC  follow up respiratory failure  SUBJECTIVE Patient remains critically ill Prognosis is guarded Severe end stage COPD   Vent Mode: PSV FiO2 (%):  [35 %] 35 % Set Rate:  [12 bmp] 12 bmp Vt Set:  [400 mL] 400 mL PEEP:  [5 cmH20] 5 cmH20 Pressure Support:  [5 cmH20-10 cmH20] 5 cmH20 Plateau Pressure:  [15 cmH20] 15 cmH20  BP (!) 108/53   Pulse 69   Temp 98.6 F (37 C) (Axillary)   Resp 16   Ht 5' 4.02" (1.626 m)   Wt 97.7 kg   SpO2 94%   BMI 36.95 kg/m    I/O last 3 completed shifts: In: 2570.1 [I.V.:1510.1; NG/GT:1060] Out: 1440 [Urine:1140; Stool:300] Total I/O In: -  Out: 350 [Urine:350]  SpO2:  94 % O2 Flow Rate (L/min): 4 L/min FiO2 (%): 35 %  Estimated body mass index is 36.95 kg/m as calculated from the following:   Height as of this encounter: 5' 4.02" (1.626 m).   Weight as of this encounter: 97.7 kg.  CRITICAL CARE NOTE  CC  follow up respiratory failure  SUBJECTIVE Patient remains critically ill Prognosis is guarded   BP (!) 108/53   Pulse 69   Temp 98.6 F (37 C) (Axillary)   Resp 16   Ht 5' 4.02" (1.626 m)   Wt 97.7 kg   SpO2 94%   BMI 36.95 kg/m    I/O last 3 completed shifts: In: 2570.1 [I.V.:1510.1; NG/GT:1060] Out: 1440 [Urine:1140; Stool:300] Total I/O In: -  Out: 350 [Urine:350]  SpO2: 94 % O2 Flow Rate (L/min): 4 L/min FiO2 (%): 35 %  Estimated body mass index is 36.95 kg/m as calculated from the following:   Height as of this encounter: 5' 4.02" (1.626 m).   Weight as of this encounter: 97.7 kg.  REVIEW OF SYSTEMS  PATIENT IS UNABLE TO PROVIDE COMPLETE REVIEW OF SYSTEM S DUE TO SEVERE CRITICAL ILLNESS AND ENCEPHALOPATHY   PHYSICAL EXAMINATION:  GENERAL:Age appropriate HEAD: Normocephalic, atraumatic.  EYES: Pupils equal, round, reactive to light.  No scleral icterus.  MOUTH: Moist mucosal membrane. NECK: Supple. No thyromegaly. No nodules. No JVD.  PULMONARY: Decreased air entry bilaterally without adventitious lung sounds CARDIOVASCULAR: S1 and S2. Regular rate and rhythm. No murmurs, rubs, or gallops.  GASTROINTESTINAL: Soft, nontender, -distended. Positive bowel sounds are hypoactive.  MUSCULOSKELETAL: No  swelling, clubbing, or edema.  NEUROLOGIC: on ventilator following commands without encouragement , muscle strength 1/4X4 SKIN:intact,warm,dry     CULTURE RESULTS   No results found for this or any previous visit (from the past 240 hour(s)).        CULTURE RESULTS   No results found for this or any previous visit (from the past 240 hour(s)).        IMAGING    DG Chest Port 1 View  Result Date:  02/03/2020 CLINICAL DATA:  Intubated, hypoxia and hypercapnia EXAM: PORTABLE CHEST 1 VIEW COMPARISON:  01/27/2020 chest radiograph. FINDINGS: Endotracheal tube tip is 3.4 cm above the carina. Left internal jugular central venous catheter terminates in lower third of the SVC. Enteric tube enters stomach with the tip not seen on this image. Stable cardiomediastinal silhouette with top-normal heart size. No pneumothorax. No pleural effusion. No overt pulmonary edema. Hazy bibasilar lung opacities, mildly increased on the left. IMPRESSION: 1. Well-positioned support structures. 2. Hazy bibasilar lung opacities, mildly increased on the left, favor atelectasis. Electronically Signed   By: Ilona Sorrel M.D.   On: 02/03/2020 07:55     Nutrition Status: Nutrition Problem: Inadequate oral intake Etiology: inability to eat (pt sedated and ventilated) Signs/Symptoms: NPO status       Indwelling Urinary Catheter continued, requirement due to   Reason to continue Indwelling Urinary Catheter strict Intake/Output monitoring for hemodynamic instability   Central Line/ continued, requirement due to  Reason to continue [place] of central venous pressure or other hemodynamic parameters and poor IV access   Ventilator continued, requirement due to severe respiratory failure   Ventilator Sedation RASS 0 to -2      ASSESSMENT AND PLAN SYNOPSIS  50 yo white female with end stage COPD, asthma with progressive resp failure failed biPAP +Cannibis abuse with severe hypoxic and hypercapnic resp failure and severe COPD exacerbation plan for therapy for CAP, failure to wean from vent Will likely need TRACH  Severe ACUTE Hypoxic and Hypercapnic Respiratory Failure -continue Mechanical Ventilator support -continue Bronchodilator Therapy -Wean Fio2 and PEEP as tolerated -VAP/VENT bundle implementation Plan for SAT/SBT when husband arrives                ACUTE DIASTOLIC CARDIAC FAILURE-  -oxygen  as needed -Lasix as tolerated   obesity, possible OSA.   Will certainly impact respiratory mechanics, ventilator weaning Suspect will need to consider additional PEEP  SEVERE COPD EXACERBATION -continue IV steroids as prescribed -continue NEB THERAPY as prescribed -morphine as needed -wean fio2 as needed and tolerated   ACUTE KIDNEY INJURY/Renal Failure -continue Foley Catheter-assess need -Avoid nephrotoxic agents -Follow urine output, BMP -Ensure adequate renal perfusion, optimize oxygenation -Renal dose medications   NEUROLOGY Acute toxic metabolic encephalopathy, need for sedation Goal RASS -2 to -3    CARDIAC ICU monitoring    DIET-->TF's as tolerated Constipation protocol as indicated  ELECTROLYTES -follow labs as needed -replace as needed -pharmacy consultation and following   DVT/GI PRX ordered and assessed TRANSFUSIONS AS NEEDED MONITOR FSBS I Assessed the need for Labs I Assessed the need for Foley I Assessed the need for Central Venous Line Family Discussion when available I Assessed the need for Mobilization I made an Assessment of medications to be adjusted accordingly Safety Risk assessment completed  CASE DISCUSSED IN MULTIDISCIPLINARY ROUNDS WITH ICU TEAM     Critical Care Time devoted to patient care services described in this note is 33 minutes.   Overall, patient is critically  ill, prognosis is guarded.  Patient with Multiorgan failure and at high risk for cardiac arrest and death.     Ottie Glazier, M.D.  Pulmonary & [place]

## 2020-02-04 ENCOUNTER — Ambulatory Visit: Admit: 2020-02-04 | Payer: 59 | Admitting: Otolaryngology

## 2020-02-04 ENCOUNTER — Inpatient Hospital Stay: Payer: 59

## 2020-02-04 ENCOUNTER — Encounter
Admission: EM | Disposition: A | Discharge status: Skilled Nursing Facility | Payer: Self-pay | Source: Home / Self Care | Attending: Internal Medicine

## 2020-02-04 DIAGNOSIS — J9621 Acute and chronic respiratory failure with hypoxia: Secondary | ICD-10-CM | POA: Diagnosis not present

## 2020-02-04 DIAGNOSIS — J9622 Acute and chronic respiratory failure with hypercapnia: Secondary | ICD-10-CM | POA: Diagnosis not present

## 2020-02-04 LAB — CBC WITH DIFFERENTIAL/PLATELET
Abs Immature Granulocytes: 0.06 10*3/uL (ref 0.00–0.07)
Basophils Absolute: 0 10*3/uL (ref 0.0–0.1)
Basophils Relative: 0 %
Eosinophils Absolute: 0 10*3/uL (ref 0.0–0.5)
Eosinophils Relative: 0 %
HCT: 33.6 % — ABNORMAL LOW (ref 36.0–46.0)
Hemoglobin: 10 g/dL — ABNORMAL LOW (ref 12.0–15.0)
Immature Granulocytes: 1 %
Lymphocytes Relative: 14 %
Lymphs Abs: 1.4 10*3/uL (ref 0.7–4.0)
MCH: 29.9 pg (ref 26.0–34.0)
MCHC: 29.8 g/dL — ABNORMAL LOW (ref 30.0–36.0)
MCV: 100.6 fL — ABNORMAL HIGH (ref 80.0–100.0)
Monocytes Absolute: 0.8 10*3/uL (ref 0.1–1.0)
Monocytes Relative: 8 %
Neutro Abs: 7.7 10*3/uL (ref 1.7–7.7)
Neutrophils Relative %: 77 %
Platelets: 240 10*3/uL (ref 150–400)
RBC: 3.34 MIL/uL — ABNORMAL LOW (ref 3.87–5.11)
RDW: 12.9 % (ref 11.5–15.5)
WBC: 10 10*3/uL (ref 4.0–10.5)
nRBC: 0.2 % (ref 0.0–0.2)

## 2020-02-04 LAB — GLUCOSE, CAPILLARY
Glucose-Capillary: 110 mg/dL — ABNORMAL HIGH (ref 70–99)
Glucose-Capillary: 112 mg/dL — ABNORMAL HIGH (ref 70–99)
Glucose-Capillary: 72 mg/dL (ref 70–99)
Glucose-Capillary: 82 mg/dL (ref 70–99)
Glucose-Capillary: 85 mg/dL (ref 70–99)
Glucose-Capillary: 86 mg/dL (ref 70–99)

## 2020-02-04 LAB — RENAL FUNCTION PANEL
Albumin: 2.7 g/dL — ABNORMAL LOW (ref 3.5–5.0)
Anion gap: 7 (ref 5–15)
BUN: 17 mg/dL (ref 6–20)
CO2: 43 mmol/L — ABNORMAL HIGH (ref 22–32)
Calcium: 8.7 mg/dL — ABNORMAL LOW (ref 8.9–10.3)
Chloride: 92 mmol/L — ABNORMAL LOW (ref 98–111)
Creatinine, Ser: <0.3 mg/dL — ABNORMAL LOW (ref 0.44–1.00)
Glucose, Bld: 95 mg/dL (ref 70–99)
Phosphorus: 2.7 mg/dL (ref 2.5–4.6)
Potassium: 3.3 mmol/L — ABNORMAL LOW (ref 3.5–5.1)
Sodium: 142 mmol/L (ref 135–145)

## 2020-02-04 LAB — BLOOD GAS, ARTERIAL
Acid-Base Excess: 23.9 mmol/L — ABNORMAL HIGH (ref 0.0–2.0)
Bicarbonate: 50.3 mmol/L — ABNORMAL HIGH (ref 20.0–28.0)
Delivery systems: POSITIVE
Expiratory PAP: 4
FIO2: 0.35
Inspiratory PAP: 8
O2 Saturation: 96.8 %
Patient temperature: 37
pCO2 arterial: 63 mmHg — ABNORMAL HIGH (ref 32.0–48.0)
pH, Arterial: 7.51 — ABNORMAL HIGH (ref 7.350–7.450)
pO2, Arterial: 80 mmHg — ABNORMAL LOW (ref 83.0–108.0)

## 2020-02-04 LAB — MAGNESIUM: Magnesium: 1.7 mg/dL (ref 1.7–2.4)

## 2020-02-04 SURGERY — CREATION, TRACHEOSTOMY
Anesthesia: Choice

## 2020-02-04 MED ORDER — METHYLPREDNISOLONE SODIUM SUCC 40 MG IJ SOLR
40.0000 mg | INTRAMUSCULAR | Status: AC
  Administered 2020-02-05 – 2020-02-06 (×2): 40 mg via INTRAVENOUS
  Filled 2020-02-04 (×2): qty 1

## 2020-02-04 MED ORDER — ENSURE ENLIVE PO LIQD
237.0000 mL | Freq: Three times a day (TID) | ORAL | Status: DC
  Administered 2020-02-05 – 2020-02-10 (×10): 237 mL via ORAL

## 2020-02-04 MED ORDER — ENOXAPARIN SODIUM 60 MG/0.6ML ~~LOC~~ SOLN
0.5000 mg/kg | SUBCUTANEOUS | Status: DC
  Administered 2020-02-04 – 2020-02-08 (×5): 50 mg via SUBCUTANEOUS
  Filled 2020-02-04 (×6): qty 0.6

## 2020-02-04 MED ORDER — MAGNESIUM SULFATE 2 GM/50ML IV SOLN
2.0000 g | Freq: Once | INTRAVENOUS | Status: AC
  Administered 2020-02-04: 2 g via INTRAVENOUS
  Filled 2020-02-04: qty 50

## 2020-02-04 MED ORDER — POTASSIUM CHLORIDE 10 MEQ/50ML IV SOLN
10.0000 meq | INTRAVENOUS | Status: AC
  Administered 2020-02-04 (×6): 10 meq via INTRAVENOUS
  Filled 2020-02-04 (×6): qty 50

## 2020-02-04 NOTE — Progress Notes (Signed)
CRITICAL CARE NOTE  50 y.o. Female admitted 01/22/20 with Acute on Chronic Hypoxic Hypercapnic Respiratory Failure in the setting of Acute COPD Exacerbation & Acute on Chronic HFpEF requiring BiPAP.   Significant Hospital Events:Hospital Course:  1/4: Admission to Stepdown1/6: Increased work of breathingon BiPAP, PCCM consulted, high risk for intubation 01/24/20 in the Stepdown unit, she is noted to have respiratory distress and increased work of breathing in BiPAP. PCCM is consulted for assistance in management. She is high risk for intubation. 1/6 intubated On MV support, 8.5 ETT placed Left CVL placed 1/7 remains on vent, severe COPD 1/8 failed weaning trial 1/10 failed multiple weaning trials 1/11 failing weaning trials 1/12 discussed plan fo care with Husband/Daughter 02/01/20- SBT today, patient with chronic CO2 retention.  02/02/20- for SBT again today, patient failed SBT yesterday.  02/03/20- patient is improved, passing SBT with plan to liberate from MV today.     CC  follow up respiratory failure  SUBJECTIVE  Doing very well, less sob + cough with yellow sputum. No fever or chills. No Chest pain, no abd pain,  No n/v/d +chronic back pain No headaches/seizures No rashes/joint pain    SIGNIFICANT EVENTS None     BP (!) 148/88   Pulse 92   Temp 97.8 F (36.6 C) (Oral)   Resp (!) 24   Ht 5' 4.02" (1.626 m)   Wt 97.7 kg   SpO2 92%   BMI 36.95 kg/m    REVIEW OF SYSTEMS See above  PHYSICAL EXAMINATION:  GENERAL:comfortable, no resp distress HEAD: Normocephalic, atraumatic.  EYES: Pupils equal, round, reactive to light.  No scleral icterus.  MOUTH: Moist mucosal membrane. NECK: Supple. No thyromegaly. No nodules. No JVD.  PULMONARY: few rhonchi, no wheezing. Poor air movement CARDIOVASCULAR: S1 and S2. Regular rate and rhythm. No murmurs, rubs, or gallops.  GASTROINTESTINAL: Soft, nontender, -distended. No masses. Positive bowel sounds. No  hepatosplenomegaly.  MUSCULOSKELETAL: No swelling, clubbing, or edema.  NEUROLOGIC: awake and alert , non focal  SKIN:intact,warm,dry  INTAKE/OUTPUT  Intake/Output Summary (Last 24 hours) at 02/04/2020 0950 Last data filed at 02/04/2020 0800 Gross per 24 hour  Intake 328.8 ml  Output 1425 ml  Net -1096.2 ml    LABS  CBC Recent Labs  Lab 02/02/20 0406 02/03/20 0519 02/04/20 0437  WBC 11.3* 9.6 10.0  HGB 10.2* 9.1* 10.0*  HCT 35.0* 31.6* 33.6*  PLT 213 206 240   Coag's No results for input(s): APTT, INR in the last 168 hours. BMET Recent Labs  Lab 02/02/20 0406 02/03/20 0519 02/04/20 0437  NA 146* 143 142  K 3.4* 3.8 3.3*  CL 91* 88* 92*  CO2 47* 46* 43*  BUN 30* 28* 17  CREATININE 0.34* <0.30* <0.30*  GLUCOSE 136* 109* 95   Electrolytes Recent Labs  Lab 02/02/20 0406 02/03/20 0519 02/04/20 0437  CALCIUM 9.2 9.0 8.7*  MG 1.8 1.9 1.7  PHOS 4.0 4.2 2.7   Sepsis Markers No results for input(s): LATICACIDVEN, PROCALCITON, O2SATVEN in the last 168 hours. ABG Recent Labs  Lab 02/03/20 1235 02/04/20 0437  PHART 7.55* 7.51*  PCO2ART 61* 63*  PO2ART 84 80*   Liver Enzymes Recent Labs  Lab 02/02/20 0406 02/03/20 0519 02/04/20 0437  ALBUMIN 2.8* 2.4* 2.7*   Cardiac Enzymes No results for input(s): TROPONINI, PROBNP in the last 168 hours. Glucose Recent Labs  Lab 02/03/20 1212 02/03/20 1550 02/03/20 1916 02/04/20 0012 02/04/20 0317 02/04/20 0742  GLUCAP 126* 104* 92 110* 112* 85  No results found for this or any previous visit (from the past 240 hour(s)).  MEDICATIONS   Current Facility-Administered Medications:  .  0.9 %  sodium chloride infusion, , Intravenous, PRN, [name], MD, Stopped at 01/31/20 1705 .  acetaminophen (TYLENOL) tablet 650 mg, 650 mg, Per Tube, Q6H PRN **OR** acetaminophen (TYLENOL) suppository 650 mg, 650 mg, Rectal, Q6H PRN, Mortimer Fries, Kurian, MD .  albuterol (PROVENTIL) (2.5 MG/3ML) 0.083% nebulizer solution 2.5  mg, 2.5 mg, Nebulization, Q4H PRN, Howerter, Justin B, DO .  albuterol (PROVENTIL) (2.5 MG/3ML) 0.083% nebulizer solution, 5 mg/hr, Nebulization, Continuous, Darel Hong D, NP, Last Rate: 6 mL/hr at 01/24/20 0837, 5 mg/hr at 01/24/20 0837 .  azithromycin (ZITHROMAX) tablet 500 mg, 500 mg, Oral, Daily, Aleskerov, Fuad, MD .  budesonide (PULMICORT) nebulizer solution 0.5 mg, 0.5 mg, Nebulization, BID, Darel Hong D, NP, 0.5 mg at 02/04/20 0546 .  chlorhexidine (PERIDEX) 0.12 % solution 15 mL, 15 mL, Mouth Rinse, BID, Lanney Gins, Fuad, MD, 15 mL at 02/03/20 2128 .  Chlorhexidine Gluconate Cloth 2 % PADS 6 each, 6 each, Topical, Q0600, Val Riles, MD, 6 each at 02/02/20 1642 .  escitalopram (LEXAPRO) tablet 20 mg, 20 mg, Per Tube, Daily, Kasa, Kurian, MD, 20 mg at 02/03/20 0932 .  famotidine (PEPCID) tablet 20 mg, 20 mg, Per Tube, BID, [name], MD, 20 mg at 02/03/20 0931 .  feeding supplement (PROSource TF) liquid 90 mL, 90 mL, Per Tube, BID, [name], MD, 90 mL at 02/03/20 0931 .  feeding supplement (VITAL AF 1.2 CAL) liquid 1,000 mL, 1,000 mL, Per Tube, Q24H, [name], MD, Stopped at 02/03/20 1115 .  fluticasone (FLONASE) 50 MCG/ACT nasal spray 1 spray, 1 spray, Each Nare, QHS, Howerter, Justin B, DO, 1 spray at 02/03/20 2128 .  guaiFENesin-codeine 100-10 MG/5ML solution 10 mL, 10 mL, Per Tube, Q6H PRN, [name], MD, 10 mL at 01/28/20 2041 .  hydrOXYzine (ATARAX/VISTARIL) tablet 25 mg, 25 mg, Per Tube, TID, [name], MD, 25 mg at 02/03/20 0932 .  insulin aspart (novoLOG) injection 0-15 Units, 0-15 Units, Subcutaneous, Q4H, Rust-Chester, Britton L, NP, 2 Units at 02/03/20 1243 .  ipratropium-albuterol (DUONEB) 0.5-2.5 (3) MG/3ML nebulizer solution 3 mL, 3 mL, Nebulization, Q6H, Howerter, Justin B, DO, 3 mL at 02/04/20 0547 .  LORazepam (ATIVAN) injection 1 mg, 1 mg, Intravenous, Q6H PRN, Val Riles, MD, 1 mg at 02/04/20 0123 .  MEDLINE mouth rinse, 15 mL, Mouth Rinse,  q12n4p, Aleskerov, Fuad, MD .  methylPREDNISolone sodium succinate (SOLU-MEDROL) 40 mg/mL injection 40 mg, 40 mg, Intravenous, Q12H, Kasa, Kurian, MD, 40 mg at 02/04/20 0752 .  montelukast (SINGULAIR) tablet 10 mg, 10 mg, Per Tube, QHS, [name], MD, 10 mg at 02/02/20 2242 .  morphine 2 MG/ML injection 2 mg, 2 mg, Intravenous, Q4H PRN, Darel Hong D, NP, 2 mg at 02/03/20 1829 .  multivitamin with minerals tablet 1 tablet, 1 tablet, Per Tube, Daily, [name], MD, 1 tablet at 02/03/20 0931 .  nicotine (NICODERM CQ - dosed in mg/24 hours) patch 14 mg, 14 mg, Transdermal, Daily PRN, Howerter, Justin B, DO .  ondansetron (ZOFRAN) injection 4 mg, 4 mg, Intravenous, Q6H PRN, Howerter, Justin B, DO .  potassium chloride 10 mEq in 50 mL *CENTRAL LINE* IVPB, 10 mEq, Intravenous, Q1 Hr x 6, Rust-Chester, Britton L, NP, Last Rate: 50 mL/hr at 02/04/20 0936, 10 mEq at 02/04/20 0936 .  QUEtiapine (SEROQUEL) tablet 50 mg, 50 mg, Per Tube, QHS, [name], MD,  50 mg at 02/02/20 2242 .  roflumilast (DALIRESP) tablet 500 mcg, 500 mcg, Per Tube, Daily, [name], MD, 500 mcg at 02/03/20 0932      Indwelling Urinary Catheter continued, requirement due to   Reason to continue Indwelling Urinary Catheter for strict Intake/Output monitoring for hemodynamic instability   Central Line continued, requirement due to   Reason to continue [employer] Monitoring of central venous pressure or other hemodynamic parameters   Ventilator continued, requirement due to, resp failure    Ventilator Sedation RASS 0 to -2     ASSESSMENT AND PLAN SYNOPSIS   Severe ACUTE Hypoxic and Hypercapnic Respiratory Failure -s/p extubation, doing well  -continue Bronchodilator Therapy O2/BIPAP as needed  ACUTE DIASTOLIC CARDIAC FAILURE-  -oxygen as needed -Lasix as tolerated   Obesity, possible OSA.  Bipap at night    SEVERE COPD EXACERBATION -continue IV steroids as prescribed/taper -continue NEB  THERAPY as prescribed -morphine as needed -wean fio2 as needed and tolerated Completed 4 days of ceftriaxone and azithro   ACUTE KIDNEY INJURY/Renal Failure -continue Foley Catheter-assess need -Avoid nephrotoxic agents -Follow urine output, BMP -Ensure adequate renal perfusion, optimize oxygenation -Renal dose medications   NEUROLOGY Acute toxic metabolic encephalopathy, need for sedation Goal RASS -2 to -3    CARDIAC ICU monitoring    DIET-->TF's as tolerated Constipation protocol as indicated  ELECTROLYTES -follow labs as needed -replace as needed -pharmacy consultation and following   DVT/GI PRX ordered TRANSFUSIONS AS NEEDED MONITOR FSBS ASSESS the need for LABS as needed Transfer to floor today    Rosine Door, MD  02/04/2020 9:50 AM [facility]

## 2020-02-04 NOTE — Progress Notes (Signed)
Patient has been on Bipap since 1:30pm. ABG drawn. Results show patient is still over-ventilating on lowest setting on Bipap.(8/4 r-8 35%) Placed patient on nasal cannula at 2.5 lpm for SpO2 of 95%. Tolerating well. Will use Bipap PRN and for sleep per NP.

## 2020-02-04 NOTE — Evaluation (Signed)
Clinical/Bedside Swallow Evaluation Patient Details  Name: Misty Green MRN: [id number] Date of Birth: 13-Dec-1970  Today's Date: 02/04/2020 Time: SLP Start Time (ACUTE ONLY): 1300 SLP Stop Time (ACUTE ONLY): 1335 SLP Time Calculation (min) (ACUTE ONLY): 35 min  Past Medical History:  Past Medical History:  Diagnosis Date  . Cardiac arrest ([place]) 05/12/2018  . CHF (congestive heart failure) ([place])   . COPD (chronic obstructive pulmonary disease) ([place])   . Current smoker   . Dyspnea   . GERD (gastroesophageal reflux disease)   . Opiate abuse, continuous ([place])    Past Surgical History:  Past Surgical History:  Procedure Laterality Date  . ABDOMINAL HYSTERECTOMY    . BACK SURGERY    . cesction    . COLOSTOMY  01/13/2018   Procedure: COLOSTOMY Creation;  Surgeon: Jules Husbands, MD;  Location: [facility] ORS;  Service: General;;  . INCISIONAL HERNIA REPAIR     lower midline laparotomy incision (hysterectomy), repaired with mesh  . IR FLUORO GUIDE CV LINE RIGHT  05/26/2018  . IR REMOVAL TUN CV CATH W/O FL  06/02/2018  . IR US GUIDE VASC ACCESS RIGHT  05/26/2018  . LAPAROSCOPIC CHOLECYSTECTOMY  2012  . LAPAROSCOPIC LYSIS OF ADHESIONS  01/13/2018   Procedure: LAPAROSCOPIC LYSIS OF ADHESIONS;  Surgeon: Jules Husbands, MD;  Location: [facility] ORS;  Service: General;;  . LAPAROSCOPIC SIGMOID COLECTOMY  01/13/2018   Procedure: LAPAROSCOPIC SIGMOID COLECTOMY;  Surgeon: Jules Husbands, MD;  Location: [facility] ORS;  Service: General;;  . ORIF WRIST FRACTURE Right 08/25/2015   Procedure: OPEN REDUCTION INTERNAL FIXATION (ORIF) WRIST FRACTURE;  Surgeon: Corky Mull, MD;  Location: [facility] ORS;  Service: Orthopedics;  Laterality: Right;   HPI:  Per admitting H&P "Misty Green is a 50 y.o. female with medical history significant for chronic hypoxic hypercapnic respiratory failure on 3.5 L continuous nasal cannula, severe COPD, chronic diastolic heart failure, chronic tobacco abuse, generalized anxiety  disorder, who is admitted to [facility] on 01/22/2020 with acute on chronic hypoxic hypercapnic respiratory failure in the setting of acute COPD exacerbation acute on chronic diastolic heart failure after presenting from home to [facility] Emergency Department complaining of shortness of breath.   Assessment / Plan / Recommendation Clinical Impression  Bedside swallow eval revealed mild to moderate dysphagia with no s/s of aspiration. Pt was confused throughout bedside swallow eval, often asking inappropriate questions and recalling events that did not occur. She had some difficulty following directions for a thorough oral mech exam often repeating the instructions rather than performing the task requested. Pt tolerated thin liquids by cup and straw but needed cues to decrease rate and take only one sip at a time. Given a bite of graham cracker, Pt needed extended time to masticate and clear oral cavity of the residue. Rec Dys 2 diet for now. Vocal quality was weak and raspy. Reviewed aspiration precautions to eat slowly and sit fully upright for meals. Pt is at risk for aspiration given her overall deconditioning, extended time on ventilator, and end stage COPD Meds to be given in applesauce. ST to follow up with toleration of diet and alter as needed. SLP Visit Diagnosis: Dysphagia, oropharyngeal phase (R13.12)    Aspiration Risk  Mild aspiration risk;Moderate aspiration risk    Diet Recommendation Dysphagia 2 (Fine chop)   Liquid Administration via: Cup;Straw Medication Administration: Whole meds with puree Supervision: Staff to assist with self feeding Compensations: Slow rate;Small sips/bites;Minimize environmental distractions Postural Changes: Seated upright  at 90 degrees;Remain upright for at least 30 minutes after po intake    Other  Recommendations     Follow up Recommendations   ST to follow up with toleration of diet     Frequency and Duration min 2x/week  1 week        Prognosis Prognosis for Safe Diet Advancement: Fair Barriers to Reach Goals: Cognitive deficits      Swallow Study   General Date of Onset: 01/22/20 HPI: Per admitting H&P "Misty Green is a 50 y.o. female with medical history significant for chronic hypoxic hypercapnic respiratory failure on 3.5 L continuous nasal cannula, severe COPD, chronic diastolic heart failure, chronic tobacco abuse, generalized anxiety disorder, who is admitted to [facility] on 01/22/2020 with acute on chronic hypoxic hypercapnic respiratory failure in the setting of acute COPD exacerbation acute on chronic diastolic heart failure after presenting from home to [facility] Emergency Department complaining of shortness of breath. Type of Study: Bedside Swallow Evaluation Diet Prior to this Study: NPO Temperature Spikes Noted: No Respiratory Status: Nasal cannula History of Recent Intubation: Yes Length of Intubations (days): 10 days Date extubated: 02/03/20 Behavior/Cognition: Alert;Cooperative;Confused;Pleasant mood;Distractible Oral Cavity Assessment: Dry Oral Care Completed by SLP: No Oral Cavity - Dentition: Poor condition Vision: Functional for self-feeding Self-Feeding Abilities: Needs assist;Needs set up Patient Positioning: Upright in bed Baseline Vocal Quality: Breathy;Hoarse;Low vocal intensity Volitional Cough: Strong Volitional Swallow: Able to elicit    Oral/Motor/Sensory Function Overall Oral Motor/Sensory Function: Generalized oral weakness Facial ROM: Within Functional Limits Facial Symmetry: Within Functional Limits Facial Strength: Within Functional Limits Lingual ROM: Other (Comment) (Difficulty following directions for thorough exam) Lingual Symmetry: Within Functional Limits Lingual Strength: Reduced Mandible: Within Functional Limits   Ice Chips Ice chips: Within functional limits Presentation: Spoon   Thin Liquid Thin Liquid: Within functional  limits Presentation: Cup;Spoon;Straw    Nectar Thick Nectar Thick Liquid: Not tested   Honey Thick Honey Thick Liquid: Not tested   Puree Puree: Within functional limits   Solid     Solid: Impaired Presentation: Self Fed Oral Phase Impairments: Impaired mastication Oral Phase Functional Implications: Prolonged oral transit;Impaired mastication;Oral residue      Lucila Maine 02/04/2020,2:10 PM

## 2020-02-04 NOTE — Progress Notes (Signed)
SHIFT SUMMARY:    Patient rested comfortably throughout the shift with minimal complaints of chronic back pain, treated with PRN Morphone

## 2020-02-04 NOTE — Progress Notes (Signed)
[facility] hospitalist service acceptance note  Is a 50 year old female with COPD admitted initially on 01/22/2020 with acute on chronic hypercarbic and hypoxic respiratory failure and acute on chronic heart failure with preserved ejection fraction.  Initially admitted to stepdown unit requiring NIPPV.  PCCM consulted, patient was high risk for intubation.  Patient was intubated emergently on 01/24/2020 in the setting of severe respiratory distress and work of breathing.  Patient remained intubated for 10days.  Failed multiple weaning trials.  Patient was successfully liberated from mechanical ventilation on 02/03/2020.    Per PCCM patient doing very well.  Weaned to 2.5 L nasal cannula.  [facility] hospitalist service to assume primary care of this patient on 02/05/2020 Case discussed with PCCM attending Dr. Bretta Bang.  Ralene Muskrat MD

## 2020-02-04 NOTE — Progress Notes (Signed)
Swallow eval completed. No s/s of aspiration. Oral phase dysphagia and confusion noted. Full report to follow. Placed on Dys 2 diet with aspiration precautions to encourage one sip at a time. Meds whole in applesauce.

## 2020-02-04 NOTE — Progress Notes (Signed)
Nutrition Follow-up  DOCUMENTATION CODES:   Obesity unspecified  INTERVENTION:   RD will add supplements pending SLP evaluation   NUTRITION DIAGNOSIS:   Inadequate oral intake related to inability to eat (pt sedated and ventilated) as evidenced by NPO status. Ongoing.  GOAL:   Patient will meet greater than or equal to 90% of their needs - previously met with TF regimen.  MONITOR:   Diet advancement,Labs,Weight trends,Skin,I & O's  ASSESSMENT:   50 y.o. female with medical history significant for diverticulitis s/p colostomy, substance abuse, chronic hypoxic hypercapnic respiratory failure on 3.5 L continuous nasal cannula, severe COPD, chronic diastolic heart failure, chronic tobacco abuse and generalized anxiety disorder who is admitted to [facility] on 01/22/2020 with acute on chronic hypoxic hypercapnic respiratory failure in the setting of acute COPD exacerbation and acute on chronic diastolic heart failure   Pt extubated 1/16. SLP evaluation pending. RD will add supplements pending SLP evaluation. Per chart, pt up ~ 10lbs from her UBW currently.   Medications reviewed and include: lovenox, pepcid, insulin, solu-medrol, MVI, KCl  Labs reviewed: K 3.3(L), creat <0.30(L), P 2.7 wnl, Mg 1.7 wnl Hgb 10.0(L), Hct 33.6(L)  Diet Order:   Diet Order            Diet NPO time specified  Diet effective now                EDUCATION NEEDS:   No education needs have been identified at this time  Skin:  Skin Assessment: Reviewed RN Assessment  Last BM:  1/17- 650ml via ostomy  Height:   Ht Readings from Last 1 Encounters:  01/29/20 5' 4.02" (1.626 m)   Weight:   Wt Readings from Last 1 Encounters:  02/02/20 97.7 kg   Ideal Body Weight:  54.5 kg  BMI:  Body mass index is 36.95 kg/m.  Estimated Nutritional Needs:   Kcal:  2000-2300kcal/day  Protein:  100-115g/day  Fluid:  1.6L/day    MS, RD, LDN Please refer to AMION for  RD and/or RD on-call/weekend/after hours pager  

## 2020-02-05 DIAGNOSIS — R0603 Acute respiratory distress: Secondary | ICD-10-CM

## 2020-02-05 DIAGNOSIS — J9601 Acute respiratory failure with hypoxia: Secondary | ICD-10-CM

## 2020-02-05 DIAGNOSIS — J9621 Acute and chronic respiratory failure with hypoxia: Secondary | ICD-10-CM | POA: Diagnosis not present

## 2020-02-05 DIAGNOSIS — J9622 Acute and chronic respiratory failure with hypercapnia: Secondary | ICD-10-CM | POA: Diagnosis not present

## 2020-02-05 LAB — GLUCOSE, CAPILLARY
Glucose-Capillary: 136 mg/dL — ABNORMAL HIGH (ref 70–99)
Glucose-Capillary: 164 mg/dL — ABNORMAL HIGH (ref 70–99)
Glucose-Capillary: 80 mg/dL (ref 70–99)
Glucose-Capillary: 84 mg/dL (ref 70–99)
Glucose-Capillary: 89 mg/dL (ref 70–99)

## 2020-02-05 LAB — RENAL FUNCTION PANEL
Albumin: 2.7 g/dL — ABNORMAL LOW (ref 3.5–5.0)
Anion gap: 11 (ref 5–15)
BUN: 12 mg/dL (ref 6–20)
CO2: 39 mmol/L — ABNORMAL HIGH (ref 22–32)
Calcium: 9.2 mg/dL (ref 8.9–10.3)
Chloride: 94 mmol/L — ABNORMAL LOW (ref 98–111)
Creatinine, Ser: 0.43 mg/dL — ABNORMAL LOW (ref 0.44–1.00)
GFR, Estimated: >60 mL/min (ref >60)
Glucose, Bld: 88 mg/dL (ref 70–99)
Phosphorus: 3.6 mg/dL (ref 2.5–4.6)
Potassium: 3 mmol/L — ABNORMAL LOW (ref 3.5–5.1)
Sodium: 144 mmol/L (ref 135–145)

## 2020-02-05 LAB — CBC WITH DIFFERENTIAL/PLATELET
Abs Immature Granulocytes: 0.03 10*3/uL (ref 0.00–0.07)
Basophils Absolute: 0 10*3/uL (ref 0.0–0.1)
Basophils Relative: 0 %
Eosinophils Absolute: 0.3 10*3/uL (ref 0.0–0.5)
Eosinophils Relative: 4 %
HCT: 35 % — ABNORMAL LOW (ref 36.0–46.0)
Hemoglobin: 10.5 g/dL — ABNORMAL LOW (ref 12.0–15.0)
Immature Granulocytes: 0 %
Lymphocytes Relative: 21 %
Lymphs Abs: 1.7 10*3/uL (ref 0.7–4.0)
MCH: 29.9 pg (ref 26.0–34.0)
MCHC: 30 g/dL (ref 30.0–36.0)
MCV: 99.7 fL (ref 80.0–100.0)
Monocytes Absolute: 0.8 10*3/uL (ref 0.1–1.0)
Monocytes Relative: 11 %
Neutro Abs: 4.9 10*3/uL (ref 1.7–7.7)
Neutrophils Relative %: 64 %
Platelets: 258 10*3/uL (ref 150–400)
RBC: 3.51 MIL/uL — ABNORMAL LOW (ref 3.87–5.11)
RDW: 13.1 % (ref 11.5–15.5)
WBC: 7.8 10*3/uL (ref 4.0–10.5)
nRBC: 0 % (ref 0.0–0.2)

## 2020-02-05 LAB — MAGNESIUM: Magnesium: 1.9 mg/dL (ref 1.7–2.4)

## 2020-02-05 MED ORDER — ALPRAZOLAM 0.5 MG PO TABS: 0.2500 mg | ORAL_TABLET | Freq: Three times a day (TID) | ORAL | Status: DC | PRN

## 2020-02-05 MED ORDER — ADULT MULTIVITAMIN W/MINERALS CH
1.0000 | ORAL_TABLET | Freq: Every day | ORAL | Status: DC
  Administered 2020-02-05 – 2020-02-11 (×7): 1 via ORAL
  Filled 2020-02-05 (×7): qty 1

## 2020-02-05 MED ORDER — HALOPERIDOL LACTATE 5 MG/ML IJ SOLN
5.0000 mg | Freq: Once | INTRAMUSCULAR | Status: AC
  Administered 2020-02-05: 5 mg via INTRAVENOUS
  Filled 2020-02-05: qty 1

## 2020-02-05 MED ORDER — GUAIFENESIN-CODEINE 100-10 MG/5ML PO SOLN: 10.0000 mL | Freq: Four times a day (QID) | ORAL | Status: DC | PRN

## 2020-02-05 MED ORDER — ACETAMINOPHEN 650 MG RE SUPP: 650.0000 mg | Freq: Four times a day (QID) | RECTAL | Status: DC | PRN

## 2020-02-05 MED ORDER — ESCITALOPRAM OXALATE 10 MG PO TABS
20.0000 mg | ORAL_TABLET | Freq: Every day | ORAL | Status: DC
Start: 2020-02-05 — End: 2020-02-11
  Administered 2020-02-05 – 2020-02-11 (×7): 20 mg via ORAL
  Filled 2020-02-05 (×7): qty 2

## 2020-02-05 MED ORDER — FAMOTIDINE 20 MG PO TABS
20.0000 mg | ORAL_TABLET | Freq: Two times a day (BID) | ORAL | Status: DC
Start: 2020-02-05 — End: 2020-02-11
  Administered 2020-02-05 – 2020-02-11 (×13): 20 mg via ORAL
  Filled 2020-02-05 (×14): qty 1

## 2020-02-05 MED ORDER — FUROSEMIDE 40 MG PO TABS
40.0000 mg | ORAL_TABLET | Freq: Every day | ORAL | Status: DC
  Administered 2020-02-05 – 2020-02-11 (×7): 40 mg via ORAL
  Filled 2020-02-05 (×7): qty 1

## 2020-02-05 MED ORDER — HYDROXYZINE HCL 25 MG PO TABS
25.0000 mg | ORAL_TABLET | Freq: Three times a day (TID) | ORAL | Status: DC
Start: 2020-02-05 — End: 2020-02-11
  Administered 2020-02-05 – 2020-02-11 (×20): 25 mg via ORAL
  Filled 2020-02-05 (×21): qty 1

## 2020-02-05 MED ORDER — QUETIAPINE FUMARATE 25 MG PO TABS
50.0000 mg | ORAL_TABLET | Freq: Every day | ORAL | Status: DC
  Administered 2020-02-05: 50 mg via ORAL
  Filled 2020-02-05: qty 2

## 2020-02-05 MED ORDER — INSULIN ASPART 100 UNIT/ML ~~LOC~~ SOLN
0.0000 [IU] | Freq: Three times a day (TID) | SUBCUTANEOUS | Status: DC
  Administered 2020-02-05: 3 [IU] via SUBCUTANEOUS
  Administered 2020-02-06 – 2020-02-09 (×2): 2 [IU] via SUBCUTANEOUS
  Filled 2020-02-05 (×3): qty 1

## 2020-02-05 MED ORDER — POTASSIUM CHLORIDE CRYS ER 20 MEQ PO TBCR
40.0000 meq | EXTENDED_RELEASE_TABLET | Freq: Two times a day (BID) | ORAL | Status: AC
  Administered 2020-02-05 (×2): 40 meq via ORAL
  Filled 2020-02-05 (×2): qty 2

## 2020-02-05 MED ORDER — ROFLUMILAST 500 MCG PO TABS
500.0000 ug | ORAL_TABLET | Freq: Every day | ORAL | Status: DC
  Administered 2020-02-05 – 2020-02-11 (×7): 500 ug via ORAL
  Filled 2020-02-05 (×7): qty 1

## 2020-02-05 MED ORDER — MONTELUKAST SODIUM 10 MG PO TABS
10.0000 mg | ORAL_TABLET | Freq: Every day | ORAL | Status: DC
Start: 2020-02-05 — End: 2020-02-11
  Administered 2020-02-05 – 2020-02-10 (×6): 10 mg via ORAL
  Filled 2020-02-05 (×6): qty 1

## 2020-02-05 MED ORDER — ACETAMINOPHEN 325 MG PO TABS
650.0000 mg | ORAL_TABLET | Freq: Four times a day (QID) | ORAL | Status: DC | PRN
  Administered 2020-02-07 – 2020-02-09 (×5): 650 mg via ORAL
  Filled 2020-02-05 (×5): qty 2

## 2020-02-05 MED ORDER — CHLORHEXIDINE GLUCONATE CLOTH 2 % EX PADS
6.0000 | MEDICATED_PAD | Freq: Every day | CUTANEOUS | Status: DC
  Administered 2020-02-07: 6 via TOPICAL

## 2020-02-05 NOTE — Progress Notes (Signed)
Foley removed per order. Purewick in place.

## 2020-02-05 NOTE — Progress Notes (Signed)
PROGRESS NOTE    Misty Green  GEX:[id number] DOB: 02/26/1970 DOA: 01/22/2020 PCP: [name], MD    Brief Narrative:  50 y.o.femalewith medical history significant forchronic hypoxic hypercapnic respiratory failure on 3.5 L continuous nasal cannula, severe COPD, chronic diastolic heart failure, chronic tobacco abuse, generalized anxiety disorder,who was admitted to [facility] on 1/4/2022with acute on chronic hypoxic hypercapnic respiratory failure in the setting of acute COPD exacerbation acute on chronic diastolic heart failureafter presenting from home to [facility] Emergency Department complaining of shortness of breath.Pt's respiratory status worsened, prompting critical care consultation and later transfer to ICU on 1/6. During pt's ICU course, she was intubated on 1/6. Patient had multiple failed weaning trials but was finally able to be weaned from vent on 1/16. Pt has since been transferred to [facility] as of 1/18  Assessment & Plan:   Principal Problem:   Acute on chronic respiratory failure with hypoxia and hypercapnia (HCC) Active Problems:   COPD exacerbation (HCC)   Anxiety   Tobacco user   Chronic obstructive pulmonary disease (HCC)   Acute on chronic diastolic CHF (congestive heart failure) (HCC)   #)Acute on chronic hypoxic hypercapnic respiratory failure due to COPD exacerbation -Pt reports baseline supplemental oxygen requirement of continuous 3.5 L nasal cannula -Pt did decompensate requiring mechanical ventillation, now extubated as of 1/16 -COVID noted to be neg -This AM, pt with decreased lung sounds, remains on 4LNC -Pt is continued on PRN albuterol nebs with scheduled duonebs q6hrs -Continued on IV solumedrol -Cont to wean O2 as tolerated -Will consult PT/OT  #) Acute on chronic diastolic heart failure:chronic diastolic heart failure, with most recent echocardiogram in March 2021,  -Initially presented with 2 to 3 days of  progressive shortness of breath associated with orthopnea as well as evidence of acutely decompensated heart failure on chest x-ray -Pt did receive IV lasix early in this hospital course -Most recent CXR noted with overall improvement -Will resume home lasix  #) Chronic tobacco abuse:  -Will need cessation -continue nicotine patch as already ordered  #) Generalized anxiety disorder:  -Pt had been continued on Lexapro as well as as needed Xanax at home. -NCCSR reviewed. Pt had been on 0.25mg  xanax as needed (dispensed 90 tabs for 30 days) -Will d/c ativan, transition to PRN po xanax for now   DVT prophylaxis: Lovenox subq Code Status: Partial (No CPR, no ACLSmeds, no defibrillation) Family Communication: Pt in room, family not at bedside  Status is: Inpatient  Remains inpatient appropriate because:Unsafe d/c plan, IV treatments appropriate due to intensity of illness or inability to take PO and Inpatient level of care appropriate due to severity of illness   Dispo: The patient is from: Home              Anticipated d/c is to: Unclear. Will need PT/OT eval to determine disposition              Anticipated d/c date is: 3 days              Patient currently is not medically stable to d/c.   Consultants:   PCCM  Procedures:     Antimicrobials: Anti-infectives (From admission, onward)   Start     Dose/Rate Route Frequency Ordered Stop   02/03/20 1400  azithromycin (ZITHROMAX) tablet 500 mg  Status:  Discontinued        500 mg Oral Daily 02/03/20 1304 02/04/20 0958   01/24/20 0945  azithromycin (ZITHROMAX) 500 mg in sodium  chloride 0.9 % 250 mL IVPB        500 mg 250 mL/hr over 60 Minutes Intravenous Every 24 hours 01/24/20 0858 01/28/20 1029   01/24/20 0945  cefTRIAXone (ROCEPHIN) 1 g in sodium chloride 0.9 % 100 mL IVPB        1 g 200 mL/hr over 30 Minutes Intravenous Every 24 hours 01/24/20 0858 01/28/20 0913       Subjective: States feeling generally "bad." No  localized pain or nausea  Objective: Vitals:   02/05/20 0223 02/05/20 0536 02/05/20 1000 02/05/20 1526  BP:  111/69 136/77 128/76  Pulse: 79 84 78 83  Resp:  (!) 22 20 20   Temp:  98.2 F (36.8 C) 97.8 F (36.6 C) 98.3 F (36.8 C)  TempSrc:  Oral  Oral  SpO2: 95% 97% 92% 92%  Weight:      Height:        Intake/Output Summary (Last 24 hours) at 02/05/2020 1754 Last data filed at 02/05/2020 1300 Gross per 24 hour  Intake 240 ml  Output 1200 ml  Net -960 ml   Filed Weights   01/31/20 0350 02/01/20 0309 02/02/20 0314  Weight: 100 kg 99.4 kg 97.7 kg    Examination:  General exam: Appears calm and comfortable  Respiratory system: increased resp effort, decreased BS throughout, no audible wheezing Cardiovascular system: S1 & S2 heard, Regular Gastrointestinal system: Abdomen is nondistended, soft and nontender. No organomegaly or masses felt. Normal bowel sounds heard. Central nervous system: Alert and oriented. No focal neurological deficits. Extremities: Symmetric 5 x 5 power. Skin: No rashes, lesions Psychiatry: Judgement and insight appear normal. Mood & affect appropriate.   Data Reviewed: I have personally reviewed following labs and imaging studies  CBC: Recent Labs  Lab 02/01/20 0414 02/02/20 0406 02/03/20 0519 02/04/20 0437 02/05/20 0528  WBC 10.4 11.3* 9.6 10.0 7.8  NEUTROABS 8.2* 7.7 7.5 7.7 4.9  HGB 9.3* 10.2* 9.1* 10.0* 10.5*  HCT 32.0* 35.0* 31.6* 33.6* 35.0*  MCV 103.2* 102.3* 102.6* 100.6* 99.7  PLT 203 213 206 240 010   Basic Metabolic Panel: Recent Labs  Lab 02/01/20 0414 02/02/20 0406 02/03/20 0519 02/04/20 0437 02/05/20 0528  NA 146* 146* 143 142 144  K 4.1 3.4* 3.8 3.3* 3.0*  CL 96* 91* 88* 92* 94*  CO2 42* 47* 46* 43* 39*  GLUCOSE 115* 136* 109* 95 88  BUN 25* 30* 28* 17 12  CREATININE 0.30* 0.34* <0.30* <0.30* 0.43*  CALCIUM 8.5* 9.2 9.0 8.7* 9.2  MG 2.0 1.8 1.9 1.7 1.9  PHOS 3.6 4.0 4.2 2.7 3.6   GFR: Estimated Creatinine  Clearance: 95.5 mL/min (A) (by C-G formula based on SCr of 0.43 mg/dL (L)). Liver Function Tests: Recent Labs  Lab 02/01/20 0414 02/02/20 0406 02/03/20 0519 02/04/20 0437 02/05/20 0528  ALBUMIN 2.7* 2.8* 2.4* 2.7* 2.7*   No results for input(s): LIPASE, AMYLASE in the last 168 hours. No results for input(s): AMMONIA in the last 168 hours. Coagulation Profile: No results for input(s): INR, PROTIME in the last 168 hours. Cardiac Enzymes: No results for input(s): CKTOTAL, CKMB, CKMBINDEX, TROPONINI in the last 168 hours. BNP (last 3 results) No results for input(s): PROBNP in the last 8760 hours. HbA1C: No results for input(s): HGBA1C in the last 72 hours. CBG: Recent Labs  Lab 02/05/20 0001 02/05/20 0526 02/05/20 0835 02/05/20 1143 02/05/20 1703  GLUCAP 84 89 80 136* 164*   Lipid Profile: Recent Labs    02/03/20 0519  TRIG  84   Thyroid Function Tests: No results for input(s): TSH, T4TOTAL, FREET4, T3FREE, THYROIDAB in the last 72 hours. Anemia Panel: No results for input(s): VITAMINB12, FOLATE, FERRITIN, TIBC, IRON, RETICCTPCT in the last 72 hours. Sepsis Labs: No results for input(s): PROCALCITON, LATICACIDVEN in the last 168 hours.  No results found for this or any previous visit (from the past 240 hour(s)).   Radiology Studies: DG Chest Port 1 View  Result Date: 02/04/2020 CLINICAL DATA:  Shortness of breath EXAM: PORTABLE CHEST 1 VIEW COMPARISON:  February 03, 2020 FINDINGS: The patient has been extubated. The left-sided central venous catheter is stable. There are improving airspace opacities bilaterally. No pneumothorax. There may be small bilateral pleural effusions. No acute osseous abnormality. The heart size is stable. IMPRESSION: 1. Improving bilateral airspace opacities. 2. Stable left-sided central venous catheter. 3. No pneumothorax. Electronically Signed   By: Constance Holster M.D.   On: 02/04/2020 04:34    Scheduled Meds: . budesonide (PULMICORT)  nebulizer solution  0.5 mg Nebulization BID  . chlorhexidine  15 mL Mouth Rinse BID  . Chlorhexidine Gluconate Cloth  6 each Topical Q0600  . enoxaparin (LOVENOX) injection  0.5 mg/kg Subcutaneous Q24H  . escitalopram  20 mg Oral Daily  . famotidine  20 mg Oral BID  . feeding supplement  237 mL Oral TID BM  . fluticasone  1 spray Each Nare QHS  . hydrOXYzine  25 mg Oral TID  . insulin aspart  0-15 Units Subcutaneous TID WC  . ipratropium-albuterol  3 mL Nebulization Q6H  . mouth rinse  15 mL Mouth Rinse q12n4p  . methylPREDNISolone (SOLU-MEDROL) injection  40 mg Intravenous Q24H  . montelukast  10 mg Oral QHS  . multivitamin with minerals  1 tablet Oral Daily  . potassium chloride  40 mEq Oral BID  . QUEtiapine  50 mg Oral QHS  . roflumilast  500 mcg Oral Daily   Continuous Infusions: . sodium chloride Stopped (01/31/20 1705)  . albuterol 5 mg/hr (01/24/20 0837)     LOS: 14 days   Marylu Lund, MD [facility] Pager On Amion  If 7PM-7AM, please contact night-coverage 02/05/2020, 5:54 PM

## 2020-02-06 LAB — CBC WITH DIFFERENTIAL/PLATELET
Abs Immature Granulocytes: 0.04 10*3/uL (ref 0.00–0.07)
Basophils Absolute: 0 10*3/uL (ref 0.0–0.1)
Basophils Relative: 0 %
Eosinophils Absolute: 0.3 10*3/uL (ref 0.0–0.5)
Eosinophils Relative: 4 %
HCT: 37.8 % (ref 36.0–46.0)
Hemoglobin: 11.6 g/dL — ABNORMAL LOW (ref 12.0–15.0)
Immature Granulocytes: 1 %
Lymphocytes Relative: 18 %
Lymphs Abs: 1.4 10*3/uL (ref 0.7–4.0)
MCH: 30.5 pg (ref 26.0–34.0)
MCHC: 30.7 g/dL (ref 30.0–36.0)
MCV: 99.5 fL (ref 80.0–100.0)
Monocytes Absolute: 0.7 10*3/uL (ref 0.1–1.0)
Monocytes Relative: 9 %
Neutro Abs: 5.4 10*3/uL (ref 1.7–7.7)
Neutrophils Relative %: 68 %
Platelets: 248 10*3/uL (ref 150–400)
RBC: 3.8 MIL/uL — ABNORMAL LOW (ref 3.87–5.11)
RDW: 13.1 % (ref 11.5–15.5)
WBC: 7.9 10*3/uL (ref 4.0–10.5)
nRBC: 0 % (ref 0.0–0.2)

## 2020-02-06 LAB — GLUCOSE, CAPILLARY
Glucose-Capillary: 121 mg/dL — ABNORMAL HIGH (ref 70–99)
Glucose-Capillary: 126 mg/dL — ABNORMAL HIGH (ref 70–99)
Glucose-Capillary: 118 mg/dL — ABNORMAL HIGH (ref 70–99)

## 2020-02-06 LAB — RENAL FUNCTION PANEL
Albumin: 3 g/dL — ABNORMAL LOW (ref 3.5–5.0)
Anion gap: 10 (ref 5–15)
BUN: 9 mg/dL (ref 6–20)
CO2: 40 mmol/L — ABNORMAL HIGH (ref 22–32)
Calcium: 9.4 mg/dL (ref 8.9–10.3)
Chloride: 96 mmol/L — ABNORMAL LOW (ref 98–111)
Creatinine, Ser: 0.4 mg/dL — ABNORMAL LOW (ref 0.44–1.00)
GFR, Estimated: >60 mL/min (ref >60)
Glucose, Bld: 136 mg/dL — ABNORMAL HIGH (ref 70–99)
Phosphorus: 4.6 mg/dL (ref 2.5–4.6)
Potassium: 3.3 mmol/L — ABNORMAL LOW (ref 3.5–5.1)
Sodium: 146 mmol/L — ABNORMAL HIGH (ref 135–145)

## 2020-02-06 LAB — MAGNESIUM: Magnesium: 1.8 mg/dL (ref 1.7–2.4)

## 2020-02-06 MED ORDER — ZIPRASIDONE MESYLATE 20 MG IM SOLR
20.0000 mg | Freq: Once | INTRAMUSCULAR | Status: AC
  Administered 2020-02-06: 20 mg via INTRAMUSCULAR
  Filled 2020-02-06: qty 20

## 2020-02-06 MED ORDER — ALPRAZOLAM 0.5 MG PO TABS: 0.5000 mg | ORAL_TABLET | Freq: Once | ORAL | Status: DC

## 2020-02-06 MED ORDER — GUAIFENESIN ER 600 MG PO TB12
1200.0000 mg | ORAL_TABLET | Freq: Two times a day (BID) | ORAL | Status: DC
  Administered 2020-02-06 – 2020-02-11 (×10): 1200 mg via ORAL
  Filled 2020-02-06 (×10): qty 2

## 2020-02-06 MED ORDER — QUETIAPINE FUMARATE 25 MG PO TABS
25.0000 mg | ORAL_TABLET | Freq: Every day | ORAL | Status: DC
  Administered 2020-02-06 – 2020-02-10 (×5): 25 mg via ORAL
  Filled 2020-02-06 (×5): qty 1

## 2020-02-06 NOTE — Evaluation (Signed)
Occupational Therapy Evaluation Patient Details Name: Misty Green MRN: [id number] DOB: 1970/10/15 Today's Date: 02/06/2020    History of Present Illness Pt is a 50 y.o. female presenting to hospital 1/4 with SOB.  Pt admitted with acute on chronic hypoxic hypercapnic respiratory failure in setting of acute COPD exacerbation and acute on chronic diastolic heart failure.  Pt transferred to ICU 1/6 d/t worsening respiratory status; intubated 1/6; weaned from vent 1/16.  PMH includes cardiac arrest, CHF, COPD, chronic fatigue; anxiety disorder, edema, OSA, anemia of chronic disease, hypoxic encephalopathy, anoxic brain injury, obesity, chronic back pain, R distal radius fx and nondisplaced fx styloid proces of R ulna 2017 (ORIF wrist fx R), back surgery, and colostomy.   Clinical Impression   Misty Green was seen for OT/PT co-evaluation this date. Pt reports she was generally independent in all ADL and functional mobility, living in a 1 story home with her spouse. Pt on 3.5 liters of O2 at home and reports becoming easily fatigued or out of breath with minimal exertion over the last month. Pt currently requires +2 CGA assist for functional mobility, as well as MOD assist for exertional ADL management including bathing and lower body dressing from STS due to current functional impairments (See OT Problem List below). Pt educated in energy conservation strategies including pursed lip breathing, activity pacing, prioritizing of meaningful occupations, and falls prevention. Handout provided. Pt verbalized understanding and would benefit from additional skilled OT services to maximize recall and carryover of learned techniques and facilitate implementation of learned techniques into daily routines. Upon discharge, recommend STR to maximize pt safety and return to PLOF.       Follow Up Recommendations  SNF    Equipment Recommendations  3 in 1 bedside commode    Recommendations for Other Services        Precautions / Restrictions Precautions Precautions: Fall Precaution Comments: L IJ CVC; LLQ colostomy Restrictions Weight Bearing Restrictions: No      Mobility Bed Mobility Overal bed mobility: Needs Assistance Bed Mobility: Supine to Sit;Sit to Supine     Supine to sit: Mod assist;HOB elevated Sit to supine: Supervision;HOB elevated   General bed mobility comments: assist for trunk semi-supine to sitting up    Transfers Overall transfer level: Needs assistance Equipment used: Rolling walker (2 wheeled) Transfers: Sit to/from Stand Sit to Stand: Min guard;+2 physical assistance         General transfer comment: VCs for safety/sequencing t/o functional mobility.    Balance Overall balance assessment: Needs assistance Sitting-balance support: No upper extremity supported;Feet supported Sitting balance-Leahy Scale: Good Sitting balance - Comments: steady sitting reaching within BOS   Standing balance support: Single extremity supported Standing balance-Leahy Scale: Fair Standing balance comment: steady standing with at least single UE support                           ADL either performed or assessed with clinical judgement   ADL Overall ADL's : Needs assistance/impaired Eating/Feeding: Set up;Supervision/ safety;Sitting   Grooming: Sitting;Set up;Supervision/safety;Brushing hair Grooming Details (indicate cue type and reason): Increased time/effort to perform. Upper Body Bathing: Sitting;Minimal assistance           Lower Body Dressing: Supervision/safety;Set up;Sitting/lateral leans Lower Body Dressing Details (indicate cue type and reason): Pt dons bilat hospital socks while circle sitting in bed with increased time/effort to perfom. Toilet Transfer: BSC;RW;+2 for physical assistance;Min guard   Toileting- Water quality scientist and Hygiene: Sit to/from  stand       Functional mobility during ADLs: +2 for physical assistance;Min guard        Vision         Perception     Praxis      Pertinent Vitals/Pain Pain Assessment: No/denies pain     Hand Dominance Right   Extremity/Trunk Assessment Upper Extremity Assessment Upper Extremity Assessment: Generalized weakness   Lower Extremity Assessment Lower Extremity Assessment: Generalized weakness   Cervical / Trunk Assessment Cervical / Trunk Assessment: Other exceptions Cervical / Trunk Exceptions: forward head/shoulders   Communication Communication Communication: No difficulties   Cognition Arousal/Alertness: Awake/alert Behavior During Therapy: WFL for tasks assessed/performed Overall Cognitive Status: Impaired/Different from baseline Area of Impairment: Following commands;Safety/judgement                       Following Commands: Follows one step commands with increased time Safety/Judgement: Decreased awareness of safety     General Comments: Oriented to person, place, and general situation; pt reporting date Feb 05 2019 initially; pt initially talking about seeing dog in room during session but with therapist questioning pt then gradually changing that she had heard dog yesterday (sound from outside of room). Per nsg, has been generally confused.   General Comments  Bruising noted on dorsal aspects of B hands. Per pt this is from IV sticks.    Exercises Other Exercises Other Exercises: Pt educated on role of OT in acute setting, safe use of AE/DME during ADL management, energy conservation with focus on pused lip breathing and activity pacing during functional mobilty, and routines modifications to support safety and functional independence upon hospital DC. Other Exercises: OT/PT facilitated bed mobility, functional transfers, seated ADL management. See above for additional detail.   Shoulder Instructions      Home Living Family/patient expects to be discharged to:: Private residence Living Arrangements: Spouse/significant  other Available Help at Discharge: Family;Available PRN/intermittently Type of Home: Mobile home Home Access: Stairs to enter Entrance Stairs-Number of Steps: 3 Entrance Stairs-Rails: Right;Left Home Layout: One level     Bathroom Shower/Tub: Tub/shower unit;Walk-in shower   Bathroom Toilet: Handicapped height     Home Equipment: [place] - single point;Walker - 2 wheels          Prior Functioning/Environment Level of Independence: Independent        Comments: No AD use baseline; no recent falls in past 6 months; 3.5 L home O2 baseline; 8 grandchildren; can walk about 5 minutes typically before getting SOB and needing to stop        OT Problem List: Decreased strength;Decreased coordination;Decreased activity tolerance;Decreased safety awareness;Cardiopulmonary status limiting activity;Decreased knowledge of use of DME or AE;Impaired balance (sitting and/or standing);Decreased cognition      OT Treatment/Interventions: Self-care/ADL training;Therapeutic exercise;Therapeutic activities;Patient/family education;DME and/or AE instruction;Energy conservation;Balance training    OT Goals(Current goals can be found in the care plan section) Acute Rehab OT Goals Patient Stated Goal: to improve strength and walking OT Goal Formulation: With patient Time For Goal Achievement: 02/20/20 Potential to Achieve Goals: Good ADL Goals Pt Will Perform Grooming: sitting;with modified independence (c LRAD PRN for improved safety and functional indep.) Pt Will Perform Lower Body Dressing: sit to/from stand;with supervision;with set-up (c LRAD PRN for improved safety and functional indep.) Pt Will Transfer to Toilet: bedside commode;with set-up;with supervision (c LRAD PRN for improved safety and functional indep.) Pt Will Perform Toileting - Clothing Manipulation and hygiene: sit to/from stand;with set-up;with supervision;with adaptive equipment (c  LRAD PRN for improved safety and functional  indep.)  OT Frequency: Min 1X/week   Barriers to D/C: Decreased caregiver support;Inaccessible home environment          Co-evaluation   Reason for Co-Treatment: Complexity of the patient's impairments (multi-system involvement);Necessary to address cognition/behavior during functional activity PT goals addressed during session: Mobility/safety with mobility;Proper use of DME OT goals addressed during session: ADL's and self-care      AM-PAC OT "6 Clicks" Daily Activity     Outcome Measure Help from another person eating meals?: None Help from another person taking care of personal grooming?: A Little Help from another person toileting, which includes using toliet, bedpan, or urinal?: A Little Help from another person bathing (including washing, rinsing, drying)?: A Lot Help from another person to put on and taking off regular upper body clothing?: A Little Help from another person to put on and taking off regular lower body clothing?: A Lot 6 Click Score: 17   End of Session Equipment Utilized During Treatment: Gait belt;Rolling walker;Oxygen  Activity Tolerance: Patient tolerated treatment well Patient left: in bed;with call bell/phone within reach;with bed alarm set  OT Visit Diagnosis: Other abnormalities of gait and mobility (R26.89);Other symptoms and signs involving cognitive function                Time: 7672-0947 OT Time Calculation (min): 39 min Charges:  OT General Charges $OT Visit: 1 Visit OT Evaluation $OT Eval Moderate Complexity: 1 Mod OT Treatments $Self Care/Home Management : 8-22 mins  Shara Blazing, M.S., OTR/L Ascom: [phone] 02/06/20, 2:12 PM

## 2020-02-06 NOTE — Care Management (Signed)
Patient continues to exhibit signs of hypercapnia associated with chronic respiratory failure secondary to severe COPD.  Interruption or failure to provide NIV would quickly lead to exacerbation of the patient's condition, hospital admission, and likely harm to the patient. Continued use is preferred.  The use of the NIV will treat patient's high PC02 levels and can reduce risk of exacerbations and future hospitalizations when used at night and during the day.  BiLevel/RAD has been tried previously and has proven ineffective at managing this patient's hypercapnia.  Ventilation is required to decrease the work of breathing and improve pulmonary status. Interruption of ventilator support would lead to decline of health status.  Patient is able to protect their airways and clear secretions on their own.    

## 2020-02-06 NOTE — TOC Initial Note (Signed)
Transition of Care [facility]) - Initial/Assessment Note    Patient Details  Name: Misty Green MRN: [id number] Date of Birth: 06/14/1970  [facility] of [facility]) CM/SW Contact:    Magnus Ivan, LCSW Phone Number: 02/06/2020, 2:31 PM  Clinical Narrative:              Patient disoriented. CSW spoke with spouse [name]. Patient lives with spouse who drives her to appointments. PCP is [facility]. [facility]acy [facility]. Patient has o2 and a bipap at home. Patient had [facility] in the past, unknown agency. No SNF history. Explained SNF recommendation. Bill reported he knows patient would not agree to SNF and that they would like patient to go home with [facility] at discharge. He denied agency preference. Reached to [facility] to review, waiting to see if they can accept patient. Confirmed home address in chart.   Expected Discharge Plan: [place] Barriers to Discharge: Continued Medical Work up   Patient Goals and CMS Choice Patient states their goals for this hospitalization and ongoing recovery are:: home with home health CMS Medicare.gov Compare Post Acute Care list provided to:: Patient Represenative (must comment) Choice offered to / list presented to : Spouse  Expected Discharge Plan and Services Expected Discharge Plan: [place]       Living arrangements for the past 2 months: Single Family Home                                      Prior Living Arrangements/Services Living arrangements for the past 2 months: Single Family Home Lives with:: Spouse Patient language and need for interpreter reviewed:: Yes Do you feel safe going back to the place where you live?: Yes      Need for Family Participation in Patient Care: Yes (Comment) Care giver support system in place?: Yes (comment) Current home services: DME Criminal Activity/Legal Involvement Pertinent to Current Situation/Hospitalization: No - Comment as  needed  Activities of Daily Living Home Assistive Devices/Equipment: CPAP,Eyeglasses,Oxygen ADL Screening (condition at time of admission) Patient's cognitive ability adequate to safely complete daily activities?: Yes Is the patient deaf or have difficulty hearing?: No Does the patient have difficulty seeing, even when wearing glasses/contacts?: Yes Does the patient have difficulty concentrating, remembering, or making decisions?: No Patient able to express need for assistance with ADLs?: Yes Does the patient have difficulty dressing or bathing?: No Independently performs ADLs?: Yes (appropriate for developmental age) Does the patient have difficulty walking or climbing stairs?: No Weakness of Legs: None Weakness of Arms/Hands: None  Permission Sought/Granted Permission sought to share information with : Chartered certified accountant granted to share information with : Yes, Verbal Permission Granted     Permission granted to share info w AGENCY: HH        Emotional Assessment       Orientation: : Fluctuating Orientation (Suspected and/or reported Sundowners) Alcohol / Substance Use: Not Applicable Psych Involvement: No (comment)  Admission diagnosis:  Shortness of breath [R06.02] COPD exacerbation ([place]) [F09.3] Acute systolic congestive heart failure (HCC) [I50.21] Acute respiratory failure with hypoxia (HCC) [J96.01] Acute on chronic respiratory failure with hypoxia and hypercapnia ([place]) [A35.57, J96.22] Patient Active Problem List   Diagnosis Date Noted  . Acute on chronic respiratory failure with hypoxia and hypercapnia ([place]) 01/22/2020  . Acute on chronic diastolic CHF (congestive heart failure) ([place]) 01/22/2020  . Respiratory  failure with hypercapnia ([place]) 07/08/2019  . Chronic fatigue 05/16/2019  . SOB (shortness of breath) 05/16/2019  . Seasonal allergic reaction 05/16/2019  . Anxiety disorder due to multiple medical problems 05/16/2019  . Edema  05/16/2019  . Encounter for screening mammogram for malignant neoplasm of breast 05/16/2019  . OSA (obstructive sleep apnea)   . Noncompliance with CPAP treatment   . Chronic obstructive pulmonary disease ([place])   . Chest wall pain   . Essential hypertension   . Steroid-induced hyperglycemia   . Anemia of chronic disease   . AKI (acute kidney injury) ([place])   . Supplemental oxygen dependent   . Generalized anxiety disorder   . Hypoxic encephalopathy ([place]) 05/27/2018  . Anoxic brain injury ([place])   . ARF (acute renal failure) ([place])   . Lactic acidosis   . Transaminitis   . Acute on chronic respiratory failure with hypercapnia ([place])   . Cardiac arrest ([place]) 05/12/2018  . Diverticulitis of colon (without mention of hemorrhage)(562.11)   . Acute diverticulitis 01/06/2018  . Mixed anxiety and depressive disorder 12/22/2017  . Obesity 12/22/2017  . Tobacco user 12/22/2017  . Diverticulitis 12/03/2017  . Spinal stenosis of lumbar region 04/12/2016  . Acute respiratory failure ([place]) 03/22/2016  . COPD exacerbation ([place]) 03/22/2016  . Chronic back pain 03/22/2016  . Opioid type dependence, abuse ([place]) 03/22/2016  . Polycythemia 03/22/2016  . Anxiety 03/22/2016  . OSA on CPAP 03/22/2016  . Closed fracture of distal end of right radius 08/25/2015  . Closed nondisplaced fracture of styloid process of right ulna 08/25/2015   PCP:  [name], MD Pharmacy[facility]   [id number] [place], [place] [address] Phone: [phone] Fax: [phone]  [facility] [place], [place], [place] [address], [place] [address] Phone: [phone] Fax: [phone]     Social Determinants of Health (SDOH) Interventions    Readmission Risk Interventions Readmission Risk Prevention Plan 05/23/2018 05/15/2018  Transportation Screening Complete (No Data)  Medication Review Press photographer) Complete  -  PCP or Specialist appointment within 3-5 days of discharge Complete -  [place] or Home Care Consult Complete -  SW Recovery Care/Counseling Consult Complete -  [place] Patient Refused -  Some recent data might be hidden

## 2020-02-06 NOTE — TOC Progression Note (Signed)
Transition of Care [facility]) - Progression Note    Patient Details  Name: Misty Green MRN: [id number] Date of Birth: 11-04-1970  Transition of Care [facility]) CM/SW Contact  Beverly Sessions, RN Phone Number: 02/06/2020, 3:32 PM  Clinical Narrative:      [place] with RT notified of order for spirometry and overnight pulse ox    Expected Discharge Plan: [place] Barriers to Discharge: Continued Medical Work up  Expected Discharge Plan and Services Expected Discharge Plan: [place] arrangements for the past 2 months: Single Family Home                                       Social Determinants of Health (SDOH) Interventions    Readmission Risk Interventions Readmission Risk Prevention Plan 05/23/2018 05/15/2018  Transportation Screening Complete (No Data)  Medication Review Press photographer) Complete -  PCP or Specialist appointment within 3-5 days of discharge Complete -  [place] or Home Care Consult Complete -  SW Recovery Care/Counseling Consult Complete -  [place] Patient Refused -  Some recent data might be hidden

## 2020-02-06 NOTE — Progress Notes (Signed)
  Speech Language Pathology Treatment: Dysphagia  Patient Details Name: Misty Green MRN: [id number] DOB: 1970/09/06 Today's Date: 02/06/2020 Time: 1017-5102 SLP Time Calculation (min) (ACUTE ONLY): 45 min  Assessment / Plan / Recommendation Clinical Impression  Pt seen for ongoing assessment of swallowing. She appears much improved and alert today; verbally responsive and able to follow instructions w/ min cues. Some distraction but redirected appropriately given verbal, visual cues. Pt is on [place] O2 support; wbc wnl. Per MD report, pt's Delirium is felt to be related to her medications and lengthy hospitalization w/ intubation/extubation.  Pt and Husband explained general aspiration precautions and agreed verbally to the need for following them especially sitting upright for all oral intake and taking Small sips Slowly. Pt assisted w/ positioning d/t weakness then given trials of thin liquids, purees and soft solids. No overt clinical s/s of aspiration were noted w/ any consistency; respiratory status remained calm and unlabored, vocal quality clear b/t trials. Pt held Cup when drinking following instructions for single, small sips slowly -- this reduces risk for aspiration. Oral phase appeared grossly [facility] for bolus management, mastication of soft solids, and timely A-P transfer for swallowing; oral clearing achieved w/ all consistencies. Improvement since eval.   Recommend upgrade to Dysphagia level 3 diet (mech soft for ease of eating per pt/husband) w/ gravies added to moisten foods; Thin liquids. Recommend general aspiration precautions; Pills Whole in Puree; tray setup and positioning and assistance at meals d/t overall weakness. ST services will continue to f/u for education as needed while admitted; NSG to reconsult. NSG updated. Precautions posted at bedside. Menu provided and soft, easy to eat food consistencies discussed for conservation of energy at this time. Husband agreed and was going  to pick some foods for next meals.    HPI HPI: Per admitting H&P "Misty Green is a 50 y.o. female with medical history significant for chronic hypoxic hypercapnic respiratory failure on 3.5 L continuous nasal cannula, severe COPD, chronic diastolic heart failure, chronic tobacco abuse, generalized anxiety disorder, who is admitted to [facility] on 01/22/2020 with acute on chronic hypoxic hypercapnic respiratory failure in the setting of acute COPD exacerbation acute on chronic diastolic heart failure after presenting from home to [facility] Emergency Department complaining of shortness of breath. Oral intubation/extubation during this admit.      SLP Plan  Continue with current plan of care       Recommendations  Diet recommendations: Dysphagia 3 (mechanical soft);Thin liquid (for ease and conservation of energy - weak UEs) Liquids provided via: Cup;Straw Medication Administration: Whole meds with puree (for safer swallowing) Supervision: Patient able to self feed;Staff to assist with self feeding;Intermittent supervision to cue for compensatory strategies Compensations: Minimize environmental distractions;Slow rate;Small sips/bites;Lingual sweep for clearance of pocketing;Follow solids with liquid Postural Changes and/or Swallow Maneuvers: Seated upright 90 degrees;Upright 30-60 min after meal;Out of bed for meals                General recommendations:  (Dietician f/u) Oral Care Recommendations: Oral care BID;Oral care before and after PO;Staff/trained caregiver to provide oral care (support pt) Follow up Recommendations: None (TBD) SLP Visit Diagnosis: Dysphagia, unspecified (R13.10) (Delirium) Plan: Continue with current plan of care       GO                 [name], MS, CCC-SLP Speech Language Pathologist Rehab Services [phone] [facility] 02/06/2020, 5:12 PM

## 2020-02-06 NOTE — Evaluation (Signed)
Physical Therapy Evaluation Patient Details Name: Misty Green MRN: [id number] DOB: 07-22-1970 Today's Date: 02/06/2020   History of Present Illness  Pt is a 50 y.o. female presenting to hospital 1/4 with SOB.  Pt admitted with acute on chronic hypoxic hypercapnic respiratory failure in setting of acute COPD exacerbation and acute on chronic diastolic heart failure.  Pt transferred to ICU 1/6 d/t worsening respiratory status; intubated 1/6; weaned from vent 1/16.  PMH includes cardiac arrest, CHF, COPD, chronic fatigue; anxiety disorder, edema, OSA, anemia of chronic disease, hypoxic encephalopathy, anoxic brain injury, obesity, chronic back pain, R distal radius fx and nondisplaced fx styloid proces of R ulna 2017 (ORIF wrist fx R), back surgery, and colostomy.  Clinical Impression  PT/OT co-evaluation performed.  Prior to hospital admission, pt was ambulatory; uses 3.5 L home O2 chronic; and lives with her husband in 1 level home with 3 STE B railings.  During session pt oriented to person, place, general situation, and initially reporting it was January 18th 2021; also during session pt initially talking about seeing dog in room but with therapist questioning pt then gradually changing that she had heard dog yesterday (sound came from outside of hospital room).  Currently pt is mod assist for trunk semi-supine to sitting edge of bed; CGA x2 to stand from bed up to RW; and CGA to min assist x2 to sidestep a few feet to L along bed with RW.  SOB noted with activity; O2 sats 93% or greater on 4 L O2 via nasal cannula and HR 91-110 bpm during sessions activities.  Pt would benefit from skilled PT to address noted impairments and functional limitations (see below for any additional details).  Upon hospital discharge, pt would benefit from STR.    Follow Up Recommendations SNF    Equipment Recommendations  Rolling walker with 5" wheels;3in1 (PT)    Recommendations for Other Services OT consult      Precautions / Restrictions Precautions Precautions: Fall Precaution Comments: L IJ CVC; LLQ colostomy Restrictions Weight Bearing Restrictions: No      Mobility  Bed Mobility Overal bed mobility: Needs Assistance Bed Mobility: Supine to Sit;Sit to Supine     Supine to sit: Mod assist;HOB elevated Sit to supine: Supervision;HOB elevated   General bed mobility comments: assist for trunk semi-supine to sitting up    Transfers Overall transfer level: Needs assistance Equipment used: Rolling walker (2 wheeled) Transfers: Sit to/from Stand Sit to Stand: Min guard;+2 physical assistance         General transfer comment: fairly strong stand up to walker; vc's required for UE/LE placement prior to standing; pt quickly sitting onto bed after taking steps towards head of bed  Ambulation/Gait Ambulation/Gait assistance: Min guard;Min assist;+2 physical assistance;+2 safety/equipment Gait Distance (Feet): 3 Feet (sidestepping to L along bed with RW) Assistive device: Rolling walker (2 wheeled)   Gait velocity: decreased   General Gait Details: pt sidestepping to L along bed with cueing  Stairs            Wheelchair Mobility    Modified Rankin (Stroke Patients Only)       Balance Overall balance assessment: Needs assistance Sitting-balance support: No upper extremity supported;Feet supported Sitting balance-Leahy Scale: Good Sitting balance - Comments: steady sitting reaching within BOS   Standing balance support: Single extremity supported Standing balance-Leahy Scale: Fair Standing balance comment: steady standing with at least single UE support  Pertinent Vitals/Pain Pain Assessment: No/denies pain    Home Living Family/patient expects to be discharged to:: Private residence Living Arrangements: Spouse/significant other Available Help at Discharge: Family;Available PRN/intermittently Type of Home: Mobile home Home  Access: Stairs to enter Entrance Stairs-Rails: Right;Left Entrance Stairs-Number of Steps: 3 Home Layout: One level Home Equipment: Cane - single point;Walker - 2 wheels      Prior Function Level of Independence: Independent         Comments: No AD use baseline; no recent falls in past 6 months; 3.5 L home O2 baseline; 8 grandchildren; can walk about 5 minutes typically before getting SOB and needing to stop     Hand Dominance   Dominant Hand: Right    Extremity/Trunk Assessment   Upper Extremity Assessment Upper Extremity Assessment: Defer to OT evaluation    Lower Extremity Assessment Lower Extremity Assessment: Generalized weakness    Cervical / Trunk Assessment Cervical / Trunk Assessment: Other exceptions Cervical / Trunk Exceptions: forward head/shoulders  Communication   Communication: No difficulties  Cognition Arousal/Alertness: Awake/alert Behavior During Therapy: WFL for tasks assessed/performed Overall Cognitive Status: Impaired/Different from baseline                                 General Comments: Oriented to person, place, and general situation; pt reporting date Feb 05 2019 initially; pt initially talking about seeing dog in room during session but with therapist questioning pt then gradually changing that she had heard dog yesterday (sound from outside of room).      General Comments General comments (skin integrity, edema, etc.): bruising noted dorsal aspect B hands (pt reports from IV sticks).  Nursing cleared pt for participation in therapy.  Pt agreeable to PT/OT session.  NT came during session to address LLQ colostomy d/t being full (more air than bowel).    Exercises  Transfer/mobility training   Assessment/Plan    PT Assessment Patient needs continued PT services  PT Problem List Decreased strength;Decreased activity tolerance;Decreased balance;Decreased mobility;Decreased cognition;Decreased knowledge of use of  DME;Cardiopulmonary status limiting activity;Decreased safety awareness;Decreased knowledge of precautions       PT Treatment Interventions DME instruction;Gait training;Stair training;Functional mobility training;Therapeutic activities;Therapeutic exercise;Balance training;Patient/family education    PT Goals (Current goals can be found in the Care Plan section)  Acute Rehab PT Goals Patient Stated Goal: to improve strength and walking PT Goal Formulation: With patient Time For Goal Achievement: 02/21/20 Potential to Achieve Goals: Good    Frequency Min 2X/week   Barriers to discharge Decreased caregiver support      Co-evaluation PT/OT/SLP Co-Evaluation/Treatment: Yes Reason for Co-Treatment: For patient/therapist safety;To address functional/ADL transfers;Necessary to address cognition/behavior during functional activity PT goals addressed during session: Mobility/safety with mobility;Proper use of DME OT goals addressed during session: ADL's and self-care       AM-PAC PT "6 Clicks" Mobility  Outcome Measure Help needed turning from your back to your side while in a flat bed without using bedrails?: None Help needed moving from lying on your back to sitting on the side of a flat bed without using bedrails?: A Lot Help needed moving to and from a bed to a chair (including a wheelchair)?: A Little Help needed standing up from a chair using your arms (e.g., wheelchair or bedside chair)?: A Little Help needed to walk in hospital room?: A Little Help needed climbing 3-5 steps with a railing? : A Lot 6 Click Score: 17  End of Session Equipment Utilized During Treatment: Gait belt;Oxygen (4 L O2 via nasal cannula) Activity Tolerance: Patient tolerated treatment well Patient left: in bed;with call bell/phone within reach;with bed alarm set Nurse Communication: Mobility status;Precautions PT Visit Diagnosis: Other abnormalities of gait and mobility (R26.89);Muscle weakness  (generalized) (M62.81);Difficulty in walking, not elsewhere classified (R26.2)    Time: 4599-7741 PT Time Calculation (min) (ACUTE ONLY): 42 min   Charges:   PT Evaluation $PT Eval Low Complexity: 1 Low PT Treatments $Therapeutic Activity: 8-22 mins       Leitha Bleak, PT 02/06/20, 11:07 AM

## 2020-02-06 NOTE — Progress Notes (Signed)
[facility] PROGRESS NOTE    Misty Green  UXY:[id number] DOB: 04/14/1970 DOA: 01/22/2020 PCP: [name], MD      Brief Narrative:  [name] is a 50 y.o. F with obesity, CHF, COPD, on home O2, and cardiac arrest who presented with dyspnea.  Patient was admitted for COPD flare, but then deteriorated and was intubated on hospital day 2.        Assessment & Plan:  Acute on chronic respiratory failure with hypoxia and hypercapnia due to COPD exacerbation -Continue Pulmicort - Continue scheduled bronchodilators - Continue Mucinex - Continue roflumilast, Singulair -Continue Famotidine  - Aggressive IS and spirometry    Acute on chronic diastolic CHF -Continue furosemide  Chronic smoking Cessation recommended  Anxiety -Continue escitalopram     Disposition: Status is: Inpatient  Remains inpatient appropriate because:Unsafe d/c plan   Dispo: The patient is from: Home              Anticipated d/c is to: SNF              Anticipated d/c date is: 2 days              Patient currently is not medically stable to d/c.              MDM: The below labs and imaging reports were reviewed and summarized above.  Medication management as above.  DVT prophylaxis:   Code Status: FULL Family Communication: husband          Subjective: Confused.  Tired, coughing, productive of sputum.  No fever.  No vomiting.  Objective: Vitals:   02/05/20 2210 02/05/20 2340 02/06/20 0457 02/06/20 1057  BP:  131/84 (!) 148/110 125/74  Pulse:  93 (!) 106 90  Resp:  18 18 20   Temp:   98.4 F (36.9 C) 99 F (37.2 C)  TempSrc:   Oral Oral  SpO2: 93% 96% 99% 94%  Weight:      Height:        Intake/Output Summary (Last 24 hours) at 02/06/2020 1125 Last data filed at 02/06/2020 1000 Gross per 24 hour  Intake 0 ml  Output 100 ml  Net -100 ml   Filed Weights   01/31/20 0350 02/01/20 0309 02/02/20 0314  Weight: 100 kg 99.4  kg 97.7 kg    Examination: General appearance:  adult female, alert and in no acute distress.  sleepy HEENT: Anicteric, conjunctiva pink, lids and lashes normal. No nasal deformity, discharge, epistaxis.  Lips moist.   Skin: Warm and dry.  no jaundice.  No suspicious rashes or lesions. Cardiac: RRR, nl S1-S2, no murmurs appreciated.  Capillary refill is brisk.  JVP not visible.  No LE edema.  Radial pulses 2+ and symmetric. Respiratory: Normal respiratory rate and rhythm.  Coarse, diminished. Abdomen: Abdomen soft.  No TTP or gaurding. No ascites, distension, hepatosplenomegaly.   MSK: No deformities or effusions. Neuro: Awake and alert.  EOMI, moves all extremities. Speech fluent.    Psych: Sensorium intact and responding to questions, attention normal. Affect normal.  Judgment and insight appear normal.    Data Reviewed: I have personally reviewed following labs and imaging studies:  CBC: Recent Labs  Lab 02/02/20 0406 02/03/20 0519 02/04/20 0437 02/05/20 0528 02/06/20 0659  WBC 11.3* 9.6 10.0 7.8 7.9  NEUTROABS 7.7 7.5 7.7 4.9 5.4  HGB 10.2* 9.1* 10.0* 10.5* 11.6*  HCT 35.0* 31.6* 33.6* 35.0* 37.8  MCV 102.3* 102.6* 100.6* 99.7 99.5  PLT  213 206 240 258 782   Basic Metabolic Panel: Recent Labs  Lab 02/02/20 0406 02/03/20 0519 02/04/20 0437 02/05/20 0528 02/06/20 0659  NA 146* 143 142 144 146*  K 3.4* 3.8 3.3* 3.0* 3.3*  CL 91* 88* 92* 94* 96*  CO2 47* 46* 43* 39* 40*  GLUCOSE 136* 109* 95 88 136*  BUN 30* 28* 17 12 9   CREATININE 0.34* <0.30* <0.30* 0.43* 0.40*  CALCIUM 9.2 9.0 8.7* 9.2 9.4  MG 1.8 1.9 1.7 1.9 1.8  PHOS 4.0 4.2 2.7 3.6 4.6   GFR: Estimated Creatinine Clearance: 95.5 mL/min (A) (by C-G formula based on SCr of 0.4 mg/dL (L)). Liver Function Tests: Recent Labs  Lab 02/02/20 0406 02/03/20 0519 02/04/20 0437 02/05/20 0528 02/06/20 0659  ALBUMIN 2.8* 2.4* 2.7* 2.7* 3.0*   No results for input(s): LIPASE, AMYLASE in the last 168 hours. No  results for input(s): AMMONIA in the last 168 hours. Coagulation Profile: No results for input(s): INR, PROTIME in the last 168 hours. Cardiac Enzymes: No results for input(s): CKTOTAL, CKMB, CKMBINDEX, TROPONINI in the last 168 hours. BNP (last 3 results) No results for input(s): PROBNP in the last 8760 hours. HbA1C: No results for input(s): HGBA1C in the last 72 hours. CBG: Recent Labs  Lab 02/05/20 0526 02/05/20 0835 02/05/20 1143 02/05/20 1703 02/06/20 0705  GLUCAP 89 80 136* 164* 121*   Lipid Profile: No results for input(s): CHOL, HDL, LDLCALC, TRIG, CHOLHDL, LDLDIRECT in the last 72 hours. Thyroid Function Tests: No results for input(s): TSH, T4TOTAL, FREET4, T3FREE, THYROIDAB in the last 72 hours. Anemia Panel: No results for input(s): VITAMINB12, FOLATE, FERRITIN, TIBC, IRON, RETICCTPCT in the last 72 hours. Urine analysis:    Component Value Date/Time   COLORURINE STRAW (A) 07/09/2019 0337   APPEARANCEUR CLEAR 07/09/2019 0337   LABSPEC 1.005 07/09/2019 0337   PHURINE 5.0 07/09/2019 0337   GLUCOSEU NEGATIVE 07/09/2019 0337   HGBUR SMALL (A) 07/09/2019 0337   BILIRUBINUR NEGATIVE 07/09/2019 0337   KETONESUR NEGATIVE 07/09/2019 0337   PROTEINUR 100 (A) 07/09/2019 0337   NITRITE NEGATIVE 07/09/2019 0337   LEUKOCYTESUR NEGATIVE 07/09/2019 0337   Sepsis Labs: @LABRCNTIP (procalcitonin:4,lacticacidven:4)  )No results found for this or any previous visit (from the past 240 hour(s)).       Radiology Studies: No results found.      Scheduled Meds: . budesonide (PULMICORT) nebulizer solution  0.5 mg Nebulization BID  . chlorhexidine  15 mL Mouth Rinse BID  . Chlorhexidine Gluconate Cloth  6 each Topical Q0600  . enoxaparin (LOVENOX) injection  0.5 mg/kg Subcutaneous Q24H  . escitalopram  20 mg Oral Daily  . famotidine  20 mg Oral BID  . feeding supplement  237 mL Oral TID BM  . fluticasone  1 spray Each Nare QHS  . furosemide  40 mg Oral Daily  .  guaiFENesin  1,200 mg Oral BID  . hydrOXYzine  25 mg Oral TID  . insulin aspart  0-15 Units Subcutaneous TID WC  . ipratropium-albuterol  3 mL Nebulization Q6H  . mouth rinse  15 mL Mouth Rinse q12n4p  . montelukast  10 mg Oral QHS  . multivitamin with minerals  1 tablet Oral Daily  . QUEtiapine  50 mg Oral QHS  . roflumilast  500 mcg Oral Daily   Continuous Infusions: . sodium chloride Stopped (01/31/20 1705)     LOS: 15 days    Time spent: 25 minutes    Edwin Dada, MD [facility] 02/06/2020, 11:25  AM     Please page though [place] or Epic secure chat:  For [employer], Adult nurse

## 2020-02-06 NOTE — Progress Notes (Signed)
Mobility Specialist - Progress Note   02/06/20 1550  Mobility  Activity Refused mobility  Mobility performed by Mobility specialist    Pt asleep w/ husband present in room upon arrival. Per husband request, hold off session this date and let pt "rest". States pt did not get enough sleep last night and she's "tired". Will hold off and re-attempt tomorrow.     Karsten Howry Mobility Specialist  02/06/20, 3:52 PM

## 2020-02-06 NOTE — TOC Progression Note (Signed)
Transition of Care [facility]) - Progression Note    Patient Details  Name: Misty Green MRN: [id number] Date of Birth: 06-24-1970  [facility] CM/SW Contact  Beverly Sessions, RN Phone Number: 02/06/2020, 2:41 PM  Clinical Narrative:    MD in agreement to screen patient for triology.  Zack with adapt to review   Expected Discharge Plan: [place] Barriers to Discharge: Continued Medical Work up  Expected Discharge Plan and Services Expected Discharge Plan: [place] arrangements for the past 2 months: Single Family Home                                       Social Determinants of Health (SDOH) Interventions    Readmission Risk Interventions Readmission Risk Prevention Plan 05/23/2018 05/15/2018  Transportation Screening Complete (No Data)  Medication Review [employer]) Complete -  PCP or Specialist appointment within 3-5 days of discharge Complete -  [place] or Home Care Consult Complete -  SW Recovery Care/Counseling Consult Complete -  [place] Patient Refused -  Some recent data might be hidden

## 2020-02-07 LAB — CBC WITH DIFFERENTIAL/PLATELET
Abs Immature Granulocytes: 0.02 10*3/uL (ref 0.00–0.07)
Basophils Absolute: 0 10*3/uL (ref 0.0–0.1)
Basophils Relative: 1 %
Eosinophils Absolute: 0.3 10*3/uL (ref 0.0–0.5)
Eosinophils Relative: 3 %
HCT: 41.6 % (ref 36.0–46.0)
Hemoglobin: 12.3 g/dL (ref 12.0–15.0)
Immature Granulocytes: 0 %
Lymphocytes Relative: 20 %
Lymphs Abs: 1.6 10*3/uL (ref 0.7–4.0)
MCH: 29.9 pg (ref 26.0–34.0)
MCHC: 29.6 g/dL — ABNORMAL LOW (ref 30.0–36.0)
MCV: 101 fL — ABNORMAL HIGH (ref 80.0–100.0)
Monocytes Absolute: 0.6 10*3/uL (ref 0.1–1.0)
Monocytes Relative: 8 %
Neutro Abs: 5.5 10*3/uL (ref 1.7–7.7)
Neutrophils Relative %: 68 %
Platelets: 243 10*3/uL (ref 150–400)
RBC: 4.12 MIL/uL (ref 3.87–5.11)
RDW: 13.2 % (ref 11.5–15.5)
WBC: 8.1 10*3/uL (ref 4.0–10.5)
nRBC: 0 % (ref 0.0–0.2)

## 2020-02-07 LAB — RENAL FUNCTION PANEL
Albumin: 3 g/dL — ABNORMAL LOW (ref 3.5–5.0)
Anion gap: 11 (ref 5–15)
BUN: 12 mg/dL (ref 6–20)
CO2: 40 mmol/L — ABNORMAL HIGH (ref 22–32)
Calcium: 9.1 mg/dL (ref 8.9–10.3)
Chloride: 93 mmol/L — ABNORMAL LOW (ref 98–111)
Creatinine, Ser: 0.36 mg/dL — ABNORMAL LOW (ref 0.44–1.00)
GFR, Estimated: >60 mL/min (ref >60)
Glucose, Bld: 91 mg/dL (ref 70–99)
Phosphorus: 5.2 mg/dL — ABNORMAL HIGH (ref 2.5–4.6)
Potassium: 3.5 mmol/L (ref 3.5–5.1)
Sodium: 144 mmol/L (ref 135–145)

## 2020-02-07 LAB — GLUCOSE, CAPILLARY
Glucose-Capillary: 92 mg/dL (ref 70–99)
Glucose-Capillary: 88 mg/dL (ref 70–99)
Glucose-Capillary: 91 mg/dL (ref 70–99)
Glucose-Capillary: 134 mg/dL — ABNORMAL HIGH (ref 70–99)
Glucose-Capillary: 107 mg/dL — ABNORMAL HIGH (ref 70–99)

## 2020-02-07 LAB — MAGNESIUM: Magnesium: 2.1 mg/dL (ref 1.7–2.4)

## 2020-02-07 MED ORDER — OXYCODONE HCL 5 MG PO TABS
5.0000 mg | ORAL_TABLET | Freq: Four times a day (QID) | ORAL | Status: DC | PRN
  Administered 2020-02-07 – 2020-02-09 (×10): 5 mg via ORAL
  Filled 2020-02-07 (×11): qty 1

## 2020-02-07 NOTE — Progress Notes (Signed)
PT Cancellation Note  Patient Details Name: Misty Green MRN: [id number] DOB: Apr 18, 1970   Cancelled Treatment:    Reason Eval/Treat Not Completed: Pain limiting ability to participate Pt reports that back pain is too severe to do anything.  "I can't even lift my leg." She hopes to feel well enough to work with PT tomorrow but kindly defers this afternoon.   Kreg Shropshire, DPT 02/07/2020, 5:42 PM

## 2020-02-07 NOTE — Progress Notes (Signed)
[facility] PROGRESS NOTE    Misty Green  KDT:[id number] DOB: November 08, 1970 DOA: 01/22/2020 PCP: [name], MD      Brief Narrative:  [name] is a 50 y.o. F with obesity, CHF, COPD, on home O2, and cardiac arrest who presented with dyspnea.  Patient was admitted for COPD flare, but then deteriorated and was intubated on hospital day 2.   1/4: Admitted, PCCM consulted due to high risk for intubation 1/6: Patient deteriorated, transfer to ICU, intubated 1/8 - 1/14: repeatedly failed SBT 1/16: extubated       Assessment & Plan:  Acute on chronic respiratory failure with hypoxia and hypercapnia due to COPD exacerbation  - Continue Pulmicort  - Continue scheduled bronchodilators - Continue Mucinex - Continue roflumilast, Singulair - Continue famotidine  - Aggressive IS and spirometry    Acute on chronic diastolic CHF Now euvolemic - Continue furosemide   Chronic smoking Cessation recommended  Anxiety - Continue escitalopram     Disposition: Status is: Inpatient  Remains inpatient appropriate because:Unsafe d/c plan   Dispo: The patient is from: Home              Anticipated d/c is to: SNF              Anticipated d/c date is: 1 day              Patient currently is medically stable to d/c.              MDM: The below labs and imaging reports were reviewed and summarized above.  Medication management as above.  DVT prophylaxis: Lovenox  Code Status: FULL Family Communication: husband by phone          Subjective: Confusion is resolved.  She still very tired and generally weak.  She is cough not productive of sputum, rattling cough.  No fever, vomiting, diarrhea.  \       Objective: Vitals:   02/06/20 2056 02/07/20 0454 02/07/20 0815 02/07/20 1154  BP: (!) 152/73 139/76 130/71 115/82  Pulse: 90 78 91 90  Resp: 20 20 16 18   Temp: 98.5 F (36.9 C) 98.8 F (37.1 C) 98.2 F (36.8 C)  98.3 F (36.8 C)  TempSrc: Oral   Oral  SpO2: 94% 98% 93% 94%  Weight:      Height:        Intake/Output Summary (Last 24 hours) at 02/07/2020 1423 Last data filed at 02/07/2020 1300 Gross per 24 hour  Intake 240 ml  Output 850 ml  Net -610 ml   Filed Weights   01/31/20 0350 02/01/20 0309 02/02/20 0314  Weight: 100 kg 99.4 kg 97.7 kg    Examination: General appearance: Obese adult female, lying in bed, no acute distress, interactive     HEENT: Anicteric, conjunctival pink, lids and lashes normal.  No nasal deformity, discharge, or epistaxis.  Lips moist, dentition normal, oropharynx moist, no oral lesions, hearing normal Skin: Warm and dry, no suspicious rashes or lesions Cardiac: Regular rate and rhythm, soft systolic murmur, JVP not visible, no lower extremity edema Respiratory: Normal respiratory rate and rhythm, lung sounds with crackles bilaterally, no wheezing. Abdomen: Abdomen soft, no tenderness palpation or guarding, no ascites or distention. MSK: No deformities or effusions Neuro: Awake and alert, extraocular movements intact, moves all extremities with severe generalized weakness, speech fluent Psych: Sensorium intact responding to questions, attention normal, affect normal, judgment and insight appear normal     Data Reviewed: I  have personally reviewed following labs and imaging studies:  CBC: Recent Labs  Lab 02/03/20 0519 02/04/20 0437 02/05/20 0528 02/06/20 0659 02/07/20 0549  WBC 9.6 10.0 7.8 7.9 8.1  NEUTROABS 7.5 7.7 4.9 5.4 5.5  HGB 9.1* 10.0* 10.5* 11.6* 12.3  HCT 31.6* 33.6* 35.0* 37.8 41.6  MCV 102.6* 100.6* 99.7 99.5 101.0*  PLT 206 240 258 248 852   Basic Metabolic Panel: Recent Labs  Lab 02/03/20 0519 02/04/20 0437 02/05/20 0528 02/06/20 0659 02/07/20 0549  NA 143 142 144 146* 144  K 3.8 3.3* 3.0* 3.3* 3.5  CL 88* 92* 94* 96* 93*  CO2 46* 43* 39* 40* 40*  GLUCOSE 109* 95 88 136* 91  BUN 28* 17 12 9 12   CREATININE <0.30* <0.30*  0.43* 0.40* 0.36*  CALCIUM 9.0 8.7* 9.2 9.4 9.1  MG 1.9 1.7 1.9 1.8 2.1  PHOS 4.2 2.7 3.6 4.6 5.2*   GFR: Estimated Creatinine Clearance: 95.5 mL/min (A) (by C-G formula based on SCr of 0.36 mg/dL (L)). Liver Function Tests: Recent Labs  Lab 02/03/20 0519 02/04/20 0437 02/05/20 0528 02/06/20 0659 02/07/20 0549  ALBUMIN 2.4* 2.7* 2.7* 3.0* 3.0*   No results for input(s): LIPASE, AMYLASE in the last 168 hours. No results for input(s): AMMONIA in the last 168 hours. Coagulation Profile: No results for input(s): INR, PROTIME in the last 168 hours. Cardiac Enzymes: No results for input(s): CKTOTAL, CKMB, CKMBINDEX, TROPONINI in the last 168 hours. BNP (last 3 results) No results for input(s): PROBNP in the last 8760 hours. HbA1C: No results for input(s): HGBA1C in the last 72 hours. CBG: Recent Labs  Lab 02/06/20 1157 02/06/20 1637 02/07/20 0629 02/07/20 0739 02/07/20 1152  GLUCAP 126* 118* 92 88 91   Lipid Profile: No results for input(s): CHOL, HDL, LDLCALC, TRIG, CHOLHDL, LDLDIRECT in the last 72 hours. Thyroid Function Tests: No results for input(s): TSH, T4TOTAL, FREET4, T3FREE, THYROIDAB in the last 72 hours. Anemia Panel: No results for input(s): VITAMINB12, FOLATE, FERRITIN, TIBC, IRON, RETICCTPCT in the last 72 hours. Urine analysis:    Component Value Date/Time   COLORURINE STRAW (A) 07/09/2019 0337   APPEARANCEUR CLEAR 07/09/2019 0337   LABSPEC 1.005 07/09/2019 0337   PHURINE 5.0 07/09/2019 0337   GLUCOSEU NEGATIVE 07/09/2019 0337   HGBUR SMALL (A) 07/09/2019 0337   BILIRUBINUR NEGATIVE 07/09/2019 0337   KETONESUR NEGATIVE 07/09/2019 0337   PROTEINUR 100 (A) 07/09/2019 0337   NITRITE NEGATIVE 07/09/2019 0337   LEUKOCYTESUR NEGATIVE 07/09/2019 0337   Sepsis Labs: @LABRCNTIP (procalcitonin:4,lacticacidven:4)  )No results found for this or any previous visit (from the past 240 hour(s)).       Radiology Studies: No results  found.      Scheduled Meds: . budesonide (PULMICORT) nebulizer solution  0.5 mg Nebulization BID  . chlorhexidine  15 mL Mouth Rinse BID  . Chlorhexidine Gluconate Cloth  6 each Topical Q0600  . enoxaparin (LOVENOX) injection  0.5 mg/kg Subcutaneous Q24H  . escitalopram  20 mg Oral Daily  . famotidine  20 mg Oral BID  . feeding supplement  237 mL Oral TID BM  . fluticasone  1 spray Each Nare QHS  . furosemide  40 mg Oral Daily  . guaiFENesin  1,200 mg Oral BID  . hydrOXYzine  25 mg Oral TID  . insulin aspart  0-15 Units Subcutaneous TID WC  . ipratropium-albuterol  3 mL Nebulization Q6H  . mouth rinse  15 mL Mouth Rinse q12n4p  . montelukast  10 mg Oral QHS  .  multivitamin with minerals  1 tablet Oral Daily  . QUEtiapine  25 mg Oral QHS  . roflumilast  500 mcg Oral Daily   Continuous Infusions: . sodium chloride Stopped (01/31/20 1705)     LOS: 16 days    Time spent: 25 minutes    Edwin Dada, MD [facility] 02/07/2020, 2:23 PM     Please page though [place] or Epic secure chat:  For [employer], Adult nurse

## 2020-02-07 NOTE — Progress Notes (Signed)
  Speech Language Pathology Treatment: Dysphagia  Patient Details Name: Misty Green MRN: [id number] DOB: 04/11/1970 Today's Date: 02/07/2020 Time: 1150-1230 SLP Time Calculation (min) (ACUTE ONLY): 40 min  Assessment / Plan / Recommendation Clinical Impression  Pt seen for ongoing assessment of swallowing. She appears much improved and alert today; verbally responsive and able to follow instructions w/ min cues. Pt appeared much improved in her mental status today -- more focused and w/ good follow through w/ tasks. She stated she felt "better today". Pt is on [place] O2 support; wbc wnl. Per MD report, pt's Delirium is felt to be related to her medications and lengthy hospitalization w/ intubation/extubation.  Pt explained general aspiration precautions and agreed verbally to the need for following them especially sitting upright for all oral intake and taking Small sips Slowly. Pt assisted w/ positioning d/t weakness then she fed herself trials of thin liquids, purees and soft solids. No overt clinical s/s of aspiration were noted w/ any consistency; respiratory status remained calm and unlabored, vocal quality clear b/t trials. Pt followed instructions for single, small sips slowly -- this reduces risk for aspiration. Oral phase appeared grossly [facility] for bolus management, mastication of soft solids, and timely A-P transfer for swallowing; oral clearing achieved w/ all consistencies. Improvement since eval.   Recommend continue a Dysphagia level 3 diet (mech soft for ease of eating per pt/husband when he is not here to help also) w/ gravies added to moisten foods; Thin liquids. Recommend general aspiration and REflux precautions; Pills Whole in Puree IF needed for ease of swallowing; tray setup and positioning and assistance at meals d/t overall weakness. ST services will sign off at this time d/t pt's progress w/ oral diet; NSG to reconsult if new needs. NSG updated. Precautions posted at bedside.  Education given on easy to eat food consistencies discussed for conservation of energy at this time. Pt agreed. Handouts left.      HPI HPI: Per admitting H&P "Misty Green is a 50 y.o. female with medical history significant for chronic hypoxic hypercapnic respiratory failure on 3.5 L continuous nasal cannula, severe COPD, chronic diastolic heart failure, chronic tobacco abuse, generalized anxiety disorder, who is admitted to [facility] on 01/22/2020 with acute on chronic hypoxic hypercapnic respiratory failure in the setting of acute COPD exacerbation acute on chronic diastolic heart failure after presenting from home to [facility] Emergency Department complaining of shortness of breath.      SLP Plan  All goals met       Recommendations  Diet recommendations: Dysphagia 3 (mechanical soft);Thin liquid (cut meats, foods for ease d/t overall weakness) Liquids provided via: Cup;Straw Medication Administration: Whole meds with puree (IF needed for ease of swallowing) Supervision: Patient able to self feed;Intermittent supervision to cue for compensatory strategies (tray setup, positioning) Compensations: Minimize environmental distractions;Slow rate;Small sips/bites;Lingual sweep for clearance of pocketing;Follow solids with liquid Postural Changes and/or Swallow Maneuvers: Seated upright 90 degrees;Upright 30-60 min after meal (REFLUX precautions)                General recommendations:  (Dieticianf/u) Oral Care Recommendations: Oral care BID;Patient independent with oral care Follow up Recommendations: None SLP Visit Diagnosis: Dysphagia, unspecified (R13.10) Plan: All goals met       GO                 [name], MS, CCC-SLP Speech Language Pathologist Rehab Services [phone] [facility] 02/07/2020, 3:37 PM

## 2020-02-07 NOTE — Progress Notes (Signed)
Mobility Specialist - Progress Note   02/07/20 1110  Mobility  Activity  (bed exercises)  Level of Assistance Standby assist, set-up cues, supervision of patient - no hands on  Assistive Device None  Mobility Response Tolerated fair  Mobility performed by Mobility specialist  $Mobility charge 1 Mobility    During mobility: 86 HR, 93% SpO2   Pt long-sitting in bed upon arrival. Pt states she is having "8/10" back pain. Pt only agreeable to bed-level exercises. Pt attempted to perform exercises w/  L leg, attempt failed. States it hurts to move L leg when having severe back pain. However, pt is motivated to proceed w/ session. Pt only performed exercises w/ R leg: ankle pump/circle x 10, slr x 10, heel slides x 10. Deferred further mobility d/t back pain. Overall, pt tolerated session fair. Pt remained in bed at the end of session w/ alarm set. All needs placed in reach. Nurse notified.     Anae Hams Mobility Specialist  02/07/20, 11:13 AM

## 2020-02-07 NOTE — NC FL2 (Signed)
[place] LEVEL OF CARE SCREENING TOOL     IDENTIFICATION  Patient Name: Misty Green Birthdate: [birth year] Sex: female Admission Date (Current Location): 01/22/2020  [facility] and [place] Number:  Herbalist and Address:  [facility], [address], [place], [place] [address]      Provider Number: [id number]  Attending Physician Name and Address:  Edwin Dada, *  Relative Name and Phone Number:       Current Level of Care: Hospital Recommended Level of Care: [place] Prior Approval Number:    Date Approved/Denied:   PASRR Number: Manual review  Discharge Plan: SNF    Current Diagnoses: Patient Active Problem List   Diagnosis Date Noted  . Acute on chronic respiratory failure with hypoxia and hypercapnia ([place]) 01/22/2020  . Acute on chronic diastolic CHF (congestive heart failure) ([place]) 01/22/2020  . Respiratory failure with hypercapnia ([place]) 07/08/2019  . Chronic fatigue 05/16/2019  . SOB (shortness of breath) 05/16/2019  . Seasonal allergic reaction 05/16/2019  . Anxiety disorder due to multiple medical problems 05/16/2019  . Edema 05/16/2019  . Encounter for screening mammogram for malignant neoplasm of breast 05/16/2019  . OSA (obstructive sleep apnea)   . Noncompliance with CPAP treatment   . Chronic obstructive pulmonary disease ([place])   . Chest wall pain   . Essential hypertension   . Steroid-induced hyperglycemia   . Anemia of chronic disease   . AKI (acute kidney injury) ([place])   . Supplemental oxygen dependent   . Generalized anxiety disorder   . Hypoxic encephalopathy ([place]) 05/27/2018  . Anoxic brain injury ([place])   . ARF (acute renal failure) ([place])   . Lactic acidosis   . Transaminitis   . Acute on chronic respiratory failure with hypercapnia ([place])   . Cardiac arrest ([place]) 05/12/2018  . Diverticulitis of colon (without mention of hemorrhage)(562.11)   . Acute  diverticulitis 01/06/2018  . Mixed anxiety and depressive disorder 12/22/2017  . Obesity 12/22/2017  . Tobacco user 12/22/2017  . Diverticulitis 12/03/2017  . Spinal stenosis of lumbar region 04/12/2016  . Acute respiratory failure ([place]) 03/22/2016  . COPD exacerbation ([place]) 03/22/2016  . Chronic back pain 03/22/2016  . Opioid type dependence, abuse ([place]) 03/22/2016  . Polycythemia 03/22/2016  . Anxiety 03/22/2016  . OSA on CPAP 03/22/2016  . Closed fracture of distal end of right radius 08/25/2015  . Closed nondisplaced fracture of styloid process of right ulna 08/25/2015    Orientation RESPIRATION BLADDER Height & Weight     Self,Time,Place  O2,Other (Comment) (Nasal Canula 4 L. Bipap QHS: 8/4 at 40%.) Incontinent,External catheter Weight: 215 lb 6.2 oz (97.7 kg) Height:  5' 4.02" (162.6 cm)  BEHAVIORAL SYMPTOMS/MOOD NEUROLOGICAL BOWEL NUTRITION STATUS   (None)  (None) Continent Diet (Extra Gravy on meats, potatoes. Ketchup on tray for Meatloaf. May have a Regular Omelette per Speech.)  AMBULATORY STATUS COMMUNICATION OF NEEDS Skin   Limited Assist Verbally Other (Comment),Bruising (Catheter entry/exit, cracking.)                       Personal Care Assistance Level of Assistance  Bathing,Feeding,Dressing Bathing Assistance: Limited assistance Feeding assistance: Limited assistance Dressing Assistance: Limited assistance     Functional Limitations Info  Sight,Hearing,Speech Sight Info: Adequate Hearing Info: Adequate Speech Info: Adequate    SPECIAL CARE FACTORS FREQUENCY  PT (By licensed PT),OT (By licensed OT),Speech therapy     PT Frequency: 5 x week  OT Frequency: 5 x week     Speech Therapy Frequency: 5 x week      Contractures Contractures Info: Not present    Additional Factors Info  Code Status,Allergies,Psychotropic Code Status Info: Partial: No CPR, ACLS meds, or Defibrillation/Cardioversion Allergies Info: "Antidepressant that starts with  T" Psychotropic Info: Anxiety: Lexapro 20 mg PO daily, Hydroxizine 25 mg PO TID, Seroquel 25 mg PO QHS.         Current Medications (02/07/2020):  This is the current hospital active medication list Current Facility-Administered Medications  Medication Dose Route Frequency Provider Last Rate Last Admin  . 0.9 %  sodium chloride infusion   Intravenous PRN [name], MD   Stopped at 01/31/20 1705  . acetaminophen (TYLENOL) tablet 650 mg  650 mg Oral Q6H PRN Dallie Piles, RPH   650 mg at 02/07/20 1317   Or  . acetaminophen (TYLENOL) suppository 650 mg  650 mg Rectal Q6H PRN Dallie Piles, RPH      . albuterol (PROVENTIL) (2.5 MG/3ML) 0.083% nebulizer solution 2.5 mg  2.5 mg Nebulization Q4H PRN Howerter, Justin B, DO      . budesonide (PULMICORT) nebulizer solution 0.5 mg  0.5 mg Nebulization BID Darel Hong D, NP   0.5 mg at 02/07/20 0749  . chlorhexidine (PERIDEX) 0.12 % solution 15 mL  15 mL Mouth Rinse BID Ottie Glazier, MD   15 mL at 02/07/20 0947  . Chlorhexidine Gluconate Cloth 2 % PADS 6 each  6 each Topical [id number] [name], MD   6 each at 02/07/20 0948  . enoxaparin (LOVENOX) injection 50 mg  0.5 mg/kg Subcutaneous Q24H Rosine Door, MD   50 mg at 02/06/20 1832  . escitalopram (LEXAPRO) tablet 20 mg  20 mg Oral Daily Dallie Piles, RPH   20 mg at 02/07/20 6045  . famotidine (PEPCID) tablet 20 mg  20 mg Oral BID Dallie Piles, RPH   20 mg at 02/07/20 4098  . feeding supplement (ENSURE ENLIVE / ENSURE PLUS) liquid 237 mL  237 mL Oral TID BM Rosine Door, MD   237 mL at 02/06/20 1000  . fluticasone (FLONASE) 50 MCG/ACT nasal spray 1 spray  1 spray Each Nare QHS Howerter, Justin B, DO   1 spray at 02/05/20 2326  . furosemide (LASIX) tablet 40 mg  40 mg Oral Daily Donne Hazel, MD   40 mg at 02/07/20 0948  . guaiFENesin (MUCINEX) 12 hr tablet 1,200 mg  1,200 mg Oral BID Edwin Dada, MD   1,200 mg at 02/07/20 0948  . guaiFENesin-codeine 100-10 MG/5ML  solution 10 mL  10 mL Oral Q6H PRN Dallie Piles, RPH      . hydrOXYzine (ATARAX/VISTARIL) tablet 25 mg  25 mg Oral TID Dallie Piles, RPH   25 mg at 02/07/20 1191  . insulin aspart (novoLOG) injection 0-15 Units  0-15 Units Subcutaneous TID WC Dallie Piles, RPH   2 Units at 02/06/20 1321  . ipratropium-albuterol (DUONEB) 0.5-2.5 (3) MG/3ML nebulizer solution 3 mL  3 mL Nebulization Q6H Howerter, Justin B, DO   3 mL at 02/07/20 1321  . MEDLINE mouth rinse  15 mL Mouth Rinse q12n4p Ottie Glazier, MD   15 mL at 02/07/20 1217  . montelukast (SINGULAIR) tablet 10 mg  10 mg Oral QHS Dallie Piles, RPH   10 mg at 02/06/20 2209  . multivitamin with minerals tablet 1 tablet  1 tablet Oral Daily Dallie Piles,  RPH   1 tablet at 02/07/20 0948  . nicotine (NICODERM CQ - dosed in mg/24 hours) patch 14 mg  14 mg Transdermal Daily PRN Howerter, Justin B, DO      . ondansetron (ZOFRAN) injection 4 mg  4 mg Intravenous Q6H PRN Howerter, Justin B, DO   4 mg at 02/06/20 0404  . oxyCODONE (Oxy IR/ROXICODONE) immediate release tablet 5 mg  5 mg Oral Q6H PRN Edwin Dada, MD   5 mg at 02/07/20 1029  . QUEtiapine (SEROQUEL) tablet 25 mg  25 mg Oral QHS Edwin Dada, MD   25 mg at 02/06/20 2209  . roflumilast (DALIRESP) tablet 500 mcg  500 mcg Oral Daily Dallie Piles, RPH   500 mcg at 02/07/20 2633     Discharge Medications: Please see discharge summary for a list of discharge medications.  Relevant Imaging Results:  Relevant Lab Results:   Additional Information SS#: [id number]  Candie Chroman, LCSW

## 2020-02-08 ENCOUNTER — Ambulatory Visit: Payer: 59 | Admitting: Family

## 2020-02-08 LAB — GLUCOSE, CAPILLARY
Glucose-Capillary: 90 mg/dL (ref 70–99)
Glucose-Capillary: 103 mg/dL — ABNORMAL HIGH (ref 70–99)
Glucose-Capillary: 102 mg/dL — ABNORMAL HIGH (ref 70–99)

## 2020-02-08 LAB — CBC WITH DIFFERENTIAL/PLATELET
Abs Immature Granulocytes: 0.02 10*3/uL (ref 0.00–0.07)
Basophils Absolute: 0 10*3/uL (ref 0.0–0.1)
Basophils Relative: 0 %
Eosinophils Absolute: 0.3 10*3/uL (ref 0.0–0.5)
Eosinophils Relative: 4 %
HCT: 43.3 % (ref 36.0–46.0)
Hemoglobin: 13.3 g/dL (ref 12.0–15.0)
Immature Granulocytes: 0 %
Lymphocytes Relative: 22 %
Lymphs Abs: 1.5 10*3/uL (ref 0.7–4.0)
MCH: 30.1 pg (ref 26.0–34.0)
MCHC: 30.7 g/dL (ref 30.0–36.0)
MCV: 98 fL (ref 80.0–100.0)
Monocytes Absolute: 0.6 10*3/uL (ref 0.1–1.0)
Monocytes Relative: 8 %
Neutro Abs: 4.6 10*3/uL (ref 1.7–7.7)
Neutrophils Relative %: 66 %
Platelets: 233 10*3/uL (ref 150–400)
RBC: 4.42 MIL/uL (ref 3.87–5.11)
RDW: 13.1 % (ref 11.5–15.5)
WBC: 7 10*3/uL (ref 4.0–10.5)
nRBC: 0 % (ref 0.0–0.2)

## 2020-02-08 LAB — RENAL FUNCTION PANEL
Albumin: 3 g/dL — ABNORMAL LOW (ref 3.5–5.0)
Anion gap: 12 (ref 5–15)
BUN: 13 mg/dL (ref 6–20)
CO2: 36 mmol/L — ABNORMAL HIGH (ref 22–32)
Calcium: 9.2 mg/dL (ref 8.9–10.3)
Chloride: 91 mmol/L — ABNORMAL LOW (ref 98–111)
Creatinine, Ser: 0.5 mg/dL (ref 0.44–1.00)
GFR, Estimated: >60 mL/min (ref >60)
Glucose, Bld: 88 mg/dL (ref 70–99)
Phosphorus: 3.8 mg/dL (ref 2.5–4.6)
Potassium: 3.6 mmol/L (ref 3.5–5.1)
Sodium: 139 mmol/L (ref 135–145)

## 2020-02-08 LAB — MAGNESIUM: Magnesium: 1.9 mg/dL (ref 1.7–2.4)

## 2020-02-08 MED ORDER — IPRATROPIUM-ALBUTEROL 0.5-2.5 (3) MG/3ML IN SOLN
3.0000 mL | Freq: Three times a day (TID) | RESPIRATORY_TRACT | Status: DC
  Administered 2020-02-09 – 2020-02-11 (×8): 3 mL via RESPIRATORY_TRACT
  Filled 2020-02-08 (×8): qty 3

## 2020-02-08 NOTE — Progress Notes (Signed)
Off to 4l [place]

## 2020-02-08 NOTE — Care Management (Signed)
RE: Misty Green  Date of Birth: 16-Mar-2070 Date:  02/08/20  To Whom It May Concern:  Please be advised that the above-named patient will require a short-term nursing home stay - anticipated 30 days or less for rehabilitation and strengthening.  The plan is for return home.

## 2020-02-08 NOTE — Progress Notes (Signed)
While on unit passed this patient's room, was able to stop in for a conversation. Patient was very emotional and grateful for her progress. She states she has been so ill, but believes she is a miracle. She was able to share her faith and what has kept her through many difficult periods. I was able to pray with her and assured her I would check back on her.

## 2020-02-08 NOTE — TOC Progression Note (Signed)
Transition of Care [facility]) - Progression Note    Patient Details  Name: Misty Green MRN: [id number] Date of Birth: 16-Mar-1970  Transition of Care [facility]) CM/SW Contact  Beverly Sessions, RN Phone Number: 02/08/2020, 5:29 PM  Clinical Narrative:    Bed offer presented.  Patient accepted bed offer at [facility] [phone] at [facility] notified and to start [place]  Per patient request I have called her husband and updated  Patient has been approved by insurance for Trilogy however patient will not be able to use trilogy at [facility].  She will have to use bipap at SNF, then trilogy be set up in the home after discharge from SNF.  Patient and Zack with [employer] notified  Will need covid test  24-48 hours prior to discharge   Expected Discharge Plan: [place] Barriers to Discharge: Continued Medical Work up  Expected Discharge Plan and Services Expected Discharge Plan: [place] arrangements for the past 2 months: Single Family Home                                       Social Determinants of Health (SDOH) Interventions    Readmission Risk Interventions Readmission Risk Prevention Plan 05/23/2018 05/15/2018  Transportation Screening Complete (No Data)  Medication Review Press photographer) Complete -  PCP or Specialist appointment within 3-5 days of discharge Complete -  [place] or Home Care Consult Complete -  SW Recovery Care/Counseling Consult Complete -  [place] Patient Refused -  Some recent data might be hidden

## 2020-02-08 NOTE — Progress Notes (Signed)
[place] Hospital[facility] PROGRESS NOTE    OKTOBER GLAZER  NWG:[id number] DOB: [birth year] DOA: 01/22/2020 PCP: [name], MD      Brief Narrative:  [name] is a 50 y.o. F with obesity, CHF, COPD, on home O2, and cardiac arrest who presented with dyspnea.  Patient was admitted for COPD flare, but then deteriorated and was intubated on hospital day 2.   1/4: Admitted, PCCM consulted due to high risk for intubation 1/6: Patient deteriorated, transfer to ICU, intubated 1/8 - 1/14: repeatedly failed SBT 1/16: extubated       Assessment & Plan:  Acute on chronic respiratory failure with hypoxia and hypercapnia due to COPD exacerbation  Symptoms gradually imrpoving  - Continue Pulmicort  - Continue scheduled SABA/SAMA - Continue Mucinex - Continue roflumilast, Singulair - Continue famotidine  - Aggressive IS and spirometry - Refer back to Dr. Lanney Gins at discharge   Delirium ICU delirium was present.  It is resolving.  She is alert and oriented whenever I evaluate her in the last 2 days.  Acute on chronic diastolic CHF Now euvolemic - Continue furosemide   Chronic smoking Cessation recommended  Anxiety - Continue escitalopram     Disposition: Status is: Inpatient  Remains inpatient appropriate because:Unsafe d/c plan   Dispo: The patient is from: Home              Anticipated d/c is to: SNF              Anticipated d/c date is: 1 day              Patient currently is medically stable to d/c.              MDM: The below labs and imaging reports were reviewed and summarized above.  Medication management as above.  DVT prophylaxis: Lovenox  Code Status: FULL Family Communication: husband by phone          Subjective: Patient still has severe generalized weakness, a little bit of flank pain, but she is able to work with therapy, and her breathing and cough are getting better.  She has had no respiratory  distress, fever, change in sputum or vomiting.       Objective: Vitals:   02/08/20 0700 02/08/20 0742 02/08/20 1302 02/08/20 1657  BP:  132/79 132/84 132/82  Pulse:  75 88 78  Resp:  20 16 16   Temp:  98.4 F (36.9 C) 99.5 F (37.5 C)   TempSrc:  Oral    SpO2: 94% 97% 95% 97%  Weight:      Height:        Intake/Output Summary (Last 24 hours) at 02/08/2020 1743 Last data filed at 02/08/2020 1300 Gross per 24 hour  Intake 480 ml  Output 400 ml  Net 80 ml   Filed Weights   02/01/20 0309 02/02/20 0314 02/08/20 0415  Weight: 99.4 kg 97.7 kg 92.2 kg    Examination: General appearance: Obese adult female, lying in bed, no acute distress, interactive     HEENT: Anicteric, conjunctival pink, lids and lashes normal Skin: No suspicious rashes or lesions Cardiac: Regular rate and rhythm, no murmurs, no lower extremity edema Respiratory: Breath sounds diminished bilaterally with coarse upper airway sounds, no wheezing.  Poor air movement. Abdomen: Abdomen soft without tenderness palpation or guarding MSK:   Neuro: Awake and alert, extraocular movements intact, moves upper and lower extremities with severe generalized weakness, 2/5, but symmetric strength, speech fluent Psych: Attention slightly  distracted, but interactive and appropriate, thought content and thought processes seem linear and goal-directed.  No hallucinations.    Data Reviewed: I have personally reviewed following labs and imaging studies:  CBC: Recent Labs  Lab 02/04/20 0437 02/05/20 0528 02/06/20 0659 02/07/20 0549 02/08/20 0613  WBC 10.0 7.8 7.9 8.1 7.0  NEUTROABS 7.7 4.9 5.4 5.5 4.6  HGB 10.0* 10.5* 11.6* 12.3 13.3  HCT 33.6* 35.0* 37.8 41.6 43.3  MCV 100.6* 99.7 99.5 101.0* 98.0  PLT 240 258 248 243 102   Basic Metabolic Panel: Recent Labs  Lab 02/04/20 0437 02/05/20 0528 02/06/20 0659 02/07/20 0549 02/08/20 0613  NA 142 144 146* 144 139  K 3.3* 3.0* 3.3* 3.5 3.6  CL 92* 94* 96* 93* 91*   CO2 43* 39* 40* 40* 36*  GLUCOSE 95 88 136* 91 88  BUN 17 12 9 12 13   CREATININE <0.30* 0.43* 0.40* 0.36* 0.50  CALCIUM 8.7* 9.2 9.4 9.1 9.2  MG 1.7 1.9 1.8 2.1 1.9  PHOS 2.7 3.6 4.6 5.2* 3.8   GFR: Estimated Creatinine Clearance: 92.6 mL/min (by C-G formula based on SCr of 0.5 mg/dL). Liver Function Tests: Recent Labs  Lab 02/04/20 0437 02/05/20 0528 02/06/20 0659 02/07/20 0549 02/08/20 0613  ALBUMIN 2.7* 2.7* 3.0* 3.0* 3.0*   No results for input(s): LIPASE, AMYLASE in the last 168 hours. No results for input(s): AMMONIA in the last 168 hours. Coagulation Profile: No results for input(s): INR, PROTIME in the last 168 hours. Cardiac Enzymes: No results for input(s): CKTOTAL, CKMB, CKMBINDEX, TROPONINI in the last 168 hours. BNP (last 3 results) No results for input(s): PROBNP in the last 8760 hours. HbA1C: No results for input(s): HGBA1C in the last 72 hours. CBG: Recent Labs  Lab 02/07/20 1622 02/07/20 2151 02/08/20 0805 02/08/20 1225 02/08/20 1654  GLUCAP 134* 107* 90 103* 102*   Lipid Profile: No results for input(s): CHOL, HDL, LDLCALC, TRIG, CHOLHDL, LDLDIRECT in the last 72 hours. Thyroid Function Tests: No results for input(s): TSH, T4TOTAL, FREET4, T3FREE, THYROIDAB in the last 72 hours. Anemia Panel: No results for input(s): VITAMINB12, FOLATE, FERRITIN, TIBC, IRON, RETICCTPCT in the last 72 hours. Urine analysis:    Component Value Date/Time   COLORURINE STRAW (A) 07/09/2019 0337   APPEARANCEUR CLEAR 07/09/2019 0337   LABSPEC 1.005 07/09/2019 0337   PHURINE 5.0 07/09/2019 0337   GLUCOSEU NEGATIVE 07/09/2019 0337   HGBUR SMALL (A) 07/09/2019 0337   BILIRUBINUR NEGATIVE 07/09/2019 0337   KETONESUR NEGATIVE 07/09/2019 0337   PROTEINUR 100 (A) 07/09/2019 0337   NITRITE NEGATIVE 07/09/2019 0337   LEUKOCYTESUR NEGATIVE 07/09/2019 0337   Sepsis Labs: @LABRCNTIP (procalcitonin:4,lacticacidven:4)  )No results found for this or any previous visit  (from the past 240 hour(s)).       Radiology Studies: No results found.      Scheduled Meds: . budesonide (PULMICORT) nebulizer solution  0.5 mg Nebulization BID  . chlorhexidine  15 mL Mouth Rinse BID  . Chlorhexidine Gluconate Cloth  6 each Topical Q0600  . enoxaparin (LOVENOX) injection  0.5 mg/kg Subcutaneous Q24H  . escitalopram  20 mg Oral Daily  . famotidine  20 mg Oral BID  . feeding supplement  237 mL Oral TID BM  . fluticasone  1 spray Each Nare QHS  . furosemide  40 mg Oral Daily  . guaiFENesin  1,200 mg Oral BID  . hydrOXYzine  25 mg Oral TID  . insulin aspart  0-15 Units Subcutaneous TID WC  . ipratropium-albuterol  3  mL Nebulization Q6H  . mouth rinse  15 mL Mouth Rinse q12n4p  . montelukast  10 mg Oral QHS  . multivitamin with minerals  1 tablet Oral Daily  . QUEtiapine  25 mg Oral QHS  . roflumilast  500 mcg Oral Daily   Continuous Infusions: . sodium chloride Stopped (01/31/20 1705)     LOS: 17 days    Time spent: 25 minutes    Edwin Dada, MD [facility] 02/08/2020, 5:43 PM     Please page though [place] or Epic secure chat:  For [employer], Adult nurse

## 2020-02-08 NOTE — Progress Notes (Signed)
Physical Therapy Treatment Patient Details Name: Misty Green MRN: [id number] DOB: 06-03-1970 Today's Date: 02/08/2020    History of Present Illness Pt is a 50 y.o. female presenting to hospital 1/4 with SOB.  Pt admitted with acute on chronic hypoxic hypercapnic respiratory failure in setting of acute COPD exacerbation and acute on chronic diastolic heart failure.  Pt transferred to ICU 1/6 d/t worsening respiratory status; intubated 1/6; weaned from vent 1/16.  PMH includes cardiac arrest, CHF, COPD, chronic fatigue; anxiety disorder, edema, OSA, anemia of chronic disease, hypoxic encephalopathy, anoxic brain injury, obesity, chronic back pain, R distal radius fx and nondisplaced fx styloid proces of R ulna 2017 (ORIF wrist fx R), back surgery, and colostomy.    PT Comments    Pt was long sitting in bed upon arriving on 4 L o2. Agrees to PT session and is cooperative and pleasant throughout. Was able to exit L side of bed, stand to RW and take steps to recliner. She fatigued very quickly but overall tolerated well. Would greatly benefit form SNF at [place] to address deficits while assisting pt to PLOF.   Follow Up Recommendations  SNF     Equipment Recommendations  Rolling walker with 5" wheels;3in1 (PT)    Recommendations for Other Services       Precautions / Restrictions Precautions Precautions: Fall Precaution Comments: L IJ CVC; LLQ colostomy    Mobility  Bed Mobility Overal bed mobility: Needs Assistance Bed Mobility: Supine to Sit;Sit to Supine     Supine to sit: HOB elevated;Min assist Sit to supine: Supervision;HOB elevated      Transfers Overall transfer level: Needs assistance Equipment used: Rolling walker (2 wheeled) Transfers: Sit to/from Stand Sit to Stand: Min guard            Ambulation/Gait Ambulation/Gait assistance: Min guard Gait Distance (Feet): 5 Feet Assistive device: Rolling walker (2 wheeled) Gait Pattern/deviations: Step-through  pattern Gait velocity: decreased   General Gait Details: Pt was able to stand EOB and ambulate to recliner       Balance Overall balance assessment: Needs assistance Sitting-balance support: No upper extremity supported;Feet supported Sitting balance-Leahy Scale: Good Sitting balance - Comments: steady sitting reaching within BOS   Standing balance support: Bilateral upper extremity supported;During functional activity Standing balance-Leahy Scale: Good         Cognition Arousal/Alertness: Awake/alert Behavior During Therapy: WFL for tasks assessed/performed Overall Cognitive Status: Impaired/Different from baseline Area of Impairment: Safety/judgement        Following Commands: Follows one step commands with increased time Safety/Judgement: Decreased awareness of safety                   Pertinent Vitals/Pain Pain Assessment: No/denies pain           PT Goals (current goals can now be found in the care plan section) Acute Rehab PT Goals Patient Stated Goal: to improve strength and walking Progress towards PT goals: Progressing toward goals    Frequency    Min 2X/week      PT Plan Current plan remains appropriate    Co-evaluation     PT goals addressed during session: Mobility/safety with mobility;Proper use of DME;Balance;Strengthening/ROM        AM-PAC PT "6 Clicks" Mobility   Outcome Measure  Help needed turning from your back to your side while in a flat bed without using bedrails?: None Help needed moving from lying on your back to sitting on the side of a flat bed  without using bedrails?: A Little Help needed moving to and from a bed to a chair (including a wheelchair)?: A Little Help needed standing up from a chair using your arms (e.g., wheelchair or bedside chair)?: A Little Help needed to walk in hospital room?: A Little Help needed climbing 3-5 steps with a railing? : A Little 6 Click Score: 19    End of Session Equipment  Utilized During Treatment: Gait belt;Oxygen Activity Tolerance: Patient tolerated treatment well Patient left: with call bell/phone within reach;in chair;with chair alarm set Nurse Communication: Mobility status;Precautions PT Visit Diagnosis: Other abnormalities of gait and mobility (R26.89);Muscle weakness (generalized) (M62.81);Difficulty in walking, not elsewhere classified (R26.2)     Time: 3744-5146 PT Time Calculation (min) (ACUTE ONLY): 18 min  Charges:  $Therapeutic Activity: 8-22 mins                     Julaine Fusi PTA 02/08/20, 2:18 PM

## 2020-02-09 DIAGNOSIS — G8929 Other chronic pain: Secondary | ICD-10-CM

## 2020-02-09 DIAGNOSIS — M549 Dorsalgia, unspecified: Secondary | ICD-10-CM

## 2020-02-09 LAB — CBC WITH DIFFERENTIAL/PLATELET
Abs Immature Granulocytes: 0.03 10*3/uL (ref 0.00–0.07)
Basophils Absolute: 0 10*3/uL (ref 0.0–0.1)
Basophils Relative: 0 %
Eosinophils Absolute: 0.3 10*3/uL (ref 0.0–0.5)
Eosinophils Relative: 4 %
HCT: 39.6 % (ref 36.0–46.0)
Hemoglobin: 12.7 g/dL (ref 12.0–15.0)
Immature Granulocytes: 0 %
Lymphocytes Relative: 23 %
Lymphs Abs: 1.7 10*3/uL (ref 0.7–4.0)
MCH: 30.8 pg (ref 26.0–34.0)
MCHC: 32.1 g/dL (ref 30.0–36.0)
MCV: 95.9 fL (ref 80.0–100.0)
Monocytes Absolute: 0.6 10*3/uL (ref 0.1–1.0)
Monocytes Relative: 8 %
Neutro Abs: 4.8 10*3/uL (ref 1.7–7.7)
Neutrophils Relative %: 65 %
Platelets: 229 10*3/uL (ref 150–400)
RBC: 4.13 MIL/uL (ref 3.87–5.11)
RDW: 13.1 % (ref 11.5–15.5)
WBC: 7.5 10*3/uL (ref 4.0–10.5)
nRBC: 0 % (ref 0.0–0.2)

## 2020-02-09 LAB — RENAL FUNCTION PANEL
Albumin: 3.2 g/dL — ABNORMAL LOW (ref 3.5–5.0)
Anion gap: 12 (ref 5–15)
BUN: 15 mg/dL (ref 6–20)
CO2: 34 mmol/L — ABNORMAL HIGH (ref 22–32)
Calcium: 9.2 mg/dL (ref 8.9–10.3)
Chloride: 95 mmol/L — ABNORMAL LOW (ref 98–111)
Creatinine, Ser: 0.46 mg/dL (ref 0.44–1.00)
GFR, Estimated: >60 mL/min (ref >60)
Glucose, Bld: 86 mg/dL (ref 70–99)
Phosphorus: 3.8 mg/dL (ref 2.5–4.6)
Potassium: 3.4 mmol/L — ABNORMAL LOW (ref 3.5–5.1)
Sodium: 141 mmol/L (ref 135–145)

## 2020-02-09 LAB — GLUCOSE, CAPILLARY
Glucose-Capillary: 130 mg/dL — ABNORMAL HIGH (ref 70–99)
Glucose-Capillary: 93 mg/dL (ref 70–99)
Glucose-Capillary: 93 mg/dL (ref 70–99)
Glucose-Capillary: 135 mg/dL — ABNORMAL HIGH (ref 70–99)

## 2020-02-09 LAB — MAGNESIUM: Magnesium: 1.9 mg/dL (ref 1.7–2.4)

## 2020-02-09 MED ORDER — ENOXAPARIN SODIUM 60 MG/0.6ML ~~LOC~~ SOLN
0.5000 mg/kg | SUBCUTANEOUS | Status: DC
  Administered 2020-02-09 – 2020-02-10 (×2): 45 mg via SUBCUTANEOUS
  Filled 2020-02-09 (×2): qty 0.6

## 2020-02-09 MED ORDER — OXYCODONE HCL 5 MG PO TABS
5.0000 mg | ORAL_TABLET | ORAL | Status: DC | PRN
  Administered 2020-02-10 – 2020-02-11 (×7): 5 mg via ORAL
  Filled 2020-02-09 (×7): qty 1

## 2020-02-09 MED ORDER — KETOROLAC TROMETHAMINE 30 MG/ML IJ SOLN
30.0000 mg | Freq: Once | INTRAMUSCULAR | Status: AC
  Administered 2020-02-09: 30 mg via INTRAVENOUS
  Filled 2020-02-09: qty 1

## 2020-02-09 MED ORDER — POTASSIUM CHLORIDE CRYS ER 20 MEQ PO TBCR
40.0000 meq | EXTENDED_RELEASE_TABLET | Freq: Once | ORAL | Status: AC
  Administered 2020-02-09: 40 meq via ORAL
  Filled 2020-02-09: qty 2

## 2020-02-09 MED ORDER — VITAMIN D (ERGOCALCIFEROL) 1.25 MG (50000 UNIT) PO CAPS
50000.0000 [IU] | ORAL_CAPSULE | ORAL | Status: DC
  Administered 2020-02-09: 50000 [IU] via ORAL
  Filled 2020-02-09: qty 1

## 2020-02-09 MED ORDER — PANTOPRAZOLE SODIUM 40 MG PO TBEC
40.0000 mg | DELAYED_RELEASE_TABLET | Freq: Two times a day (BID) | ORAL | Status: DC
  Administered 2020-02-09 – 2020-02-11 (×4): 40 mg via ORAL
  Filled 2020-02-09 (×4): qty 1

## 2020-02-09 MED ORDER — GABAPENTIN 400 MG PO CAPS
400.0000 mg | ORAL_CAPSULE | Freq: Three times a day (TID) | ORAL | Status: DC
Start: 2020-02-09 — End: 2020-02-11
  Administered 2020-02-09 – 2020-02-11 (×6): 400 mg via ORAL
  Filled 2020-02-09 (×6): qty 1

## 2020-02-09 NOTE — Progress Notes (Signed)
PROGRESS NOTE    Misty Green  DPO:[id number] DOB: 11/14/1970 DOA: 01/22/2020 PCP: [name], MD    Chief Complaint  Patient presents with  . Shortness of Breath    Brief Narrative:  [name] is a 50 y.o. F with obesity, CHF, COPD, on home O2, and cardiac arrest who presented with dyspnea.  Patient was admitted for COPD flare, but then deteriorated and was intubated on hospital day 2.   1/4: Admitted, PCCM consulted due to high risk for intubation 1/6: Patient deteriorated, transfer to ICU, intubated 1/8 - 1/14: repeatedly failed SBT 1/16: extubated   Assessment & Plan:   Principal Problem:   Acute on chronic respiratory failure with hypoxia and hypercapnia (HCC) Active Problems:   COPD exacerbation ([place])   Anxiety   Tobacco user   Chronic obstructive pulmonary disease ([place])   Acute on chronic diastolic CHF (congestive heart failure) ([place])  1 acute on chronic respiratory failure with hypoxia and hypercarbia secondary to acute COPD exacerbation Improving clinically.  Baseline home O2 of 3.5 L nasal cannula. Patient during the hospitalization deteriorated/decompensated requiring mechanical ventilation and extubated 02/03/2020.  COVID-19 PCR negative. -Currently on 4 L nasal cannula. -Was on IV steroids and currently has been weaned off steroids. -Continue Pulmicort/scheduled Saba/Sama/Mucinex/Singulair/Roflumilast/Pepcid. -Continue incentive spirometry -We will need outpatient follow-up with pulmonary, Dr. Lanney Gins on discharge.  2.  ICU delirium Improving.  Alert and oriented.  Seems appropriate.  3.  Acute on chronic diastolic CHF On presentation patient noted to have a 2 to 3-day history of progressive shortness of breath with associated orthopnea with evidence of acutely decompensated CHF on chest x-ray.  Patient diuresed with IV Lasix with clinical improvement.  Back on home dose oral Lasix which we will continue.  Outpatient follow-up with  cardiology.  4.  Chronic tobacco abuse Tobacco cessation.  Nicotine patch.  5.  Generalized anxiety disorder Continue Lexapro.  Xanax as needed.  6.  Chronic back pain Resume home regimen Neurontin.  Change oxycodone to 5 mg every 4 hours as needed pain.   DVT prophylaxis: Lovenox Code Status: Partial Family Communication: Updated patient and husband at bedside. Disposition:   Status is: Inpatient    Dispo: The patient is from: Home              Anticipated d/c is to: SNF              Anticipated d/c date is: 02/11/2020              Patient currently medically stable for discharge.       Consultants:   PCCM  Procedures:     Antimicrobials:  Anti-infectives (From admission, onward)   Start     Dose/Rate Route Frequency Ordered Stop   02/03/20 1400  azithromycin (ZITHROMAX) tablet 500 mg  Status:  Discontinued        500 mg Oral Daily 02/03/20 1304 02/04/20 0958   01/24/20 0945  azithromycin (ZITHROMAX) 500 mg in sodium chloride 0.9 % 250 mL IVPB        500 mg 250 mL/hr over 60 Minutes Intravenous Every 24 hours 01/24/20 0858 01/28/20 1029   01/24/20 0945  cefTRIAXone (ROCEPHIN) 1 g in sodium chloride 0.9 % 100 mL IVPB        1 g 200 mL/hr over 30 Minutes Intravenous Every 24 hours 01/24/20 0858 01/28/20 0913        Subjective: Laying in bed.  States she is slowly feeling better.  Complains of  a chronic back pain which is unchanged.  Denies any chest pain. States she understands she needs to go to SNF to get stronger and is willing to participate in that.  Currently on 4 L nasal cannula with sats of 95%.  Objective: Vitals:   02/08/20 1932 02/09/20 0435 02/09/20 0827 02/09/20 1156  BP: (!) 146/82 114/89 138/76 116/89  Pulse: 97 83 84 90  Resp: 20 18 20 15   Temp: 98.3 F (36.8 C) 98.4 F (36.9 C) 98.1 F (36.7 C) 98.5 F (36.9 C)  TempSrc: Oral Oral Oral Oral  SpO2: 91% 98% 93% 95%  Weight:      Height:        Intake/Output Summary (Last 24  hours) at 02/09/2020 1326 Last data filed at 02/09/2020 0400 Gross per 24 hour  Intake --  Output 600 ml  Net -600 ml   Filed Weights   02/01/20 0309 02/02/20 0314 02/08/20 0415  Weight: 99.4 kg 97.7 kg 92.2 kg    Examination:  General exam: Appears calm and comfortable  Respiratory system: Clear to auscultation anterior lung fields. Respiratory effort normal. Cardiovascular system: S1 & S2 heard, RRR. No JVD, murmurs, rubs, gallops or clicks. No pedal edema. Gastrointestinal system: Abdomen is nondistended, soft and nontender. No organomegaly or masses felt. Normal bowel sounds heard. Central nervous system: Alert and oriented. No focal neurological deficits. Extremities: Symmetric 5 x 5 power. Skin: No rashes, lesions or ulcers Psychiatry: Judgement and insight appear normal. Mood & affect appropriate.     Data Reviewed: I have personally reviewed following labs and imaging studies  CBC: Recent Labs  Lab 02/05/20 0528 02/06/20 0659 02/07/20 0549 02/08/20 0613 02/09/20 0427  WBC 7.8 7.9 8.1 7.0 7.5  NEUTROABS 4.9 5.4 5.5 4.6 4.8  HGB 10.5* 11.6* 12.3 13.3 12.7  HCT 35.0* 37.8 41.6 43.3 39.6  MCV 99.7 99.5 101.0* 98.0 95.9  PLT 258 248 243 233 671    Basic Metabolic Panel: Recent Labs  Lab 02/05/20 0528 02/06/20 0659 02/07/20 0549 02/08/20 0613 02/09/20 0427  NA 144 146* 144 139 141  K 3.0* 3.3* 3.5 3.6 3.4*  CL 94* 96* 93* 91* 95*  CO2 39* 40* 40* 36* 34*  GLUCOSE 88 136* 91 88 86  BUN 12 9 12 13 15   CREATININE 0.43* 0.40* 0.36* 0.50 0.46  CALCIUM 9.2 9.4 9.1 9.2 9.2  MG 1.9 1.8 2.1 1.9 1.9  PHOS 3.6 4.6 5.2* 3.8 3.8    GFR: Estimated Creatinine Clearance: 92.6 mL/min (by C-G formula based on SCr of 0.46 mg/dL).  Liver Function Tests: Recent Labs  Lab 02/05/20 0528 02/06/20 0659 02/07/20 0549 02/08/20 0613 02/09/20 0427  ALBUMIN 2.7* 3.0* 3.0* 3.0* 3.2*    CBG: Recent Labs  Lab 02/08/20 0805 02/08/20 1225 02/08/20 1654 02/09/20 0731  02/09/20 1155  GLUCAP 90 103* 102* 130* 93     No results found for this or any previous visit (from the past 240 hour(s)).       Radiology Studies: No results found.      Scheduled Meds: . budesonide (PULMICORT) nebulizer solution  0.5 mg Nebulization BID  . chlorhexidine  15 mL Mouth Rinse BID  . Chlorhexidine Gluconate Cloth  6 each Topical Q0600  . enoxaparin (LOVENOX) injection  0.5 mg/kg Subcutaneous Q24H  . escitalopram  20 mg Oral Daily  . famotidine  20 mg Oral BID  . feeding supplement  237 mL Oral TID BM  . fluticasone  1 spray Each Nare QHS  .  furosemide  40 mg Oral Daily  . guaiFENesin  1,200 mg Oral BID  . hydrOXYzine  25 mg Oral TID  . insulin aspart  0-15 Units Subcutaneous TID WC  . ipratropium-albuterol  3 mL Nebulization TID  . mouth rinse  15 mL Mouth Rinse q12n4p  . montelukast  10 mg Oral QHS  . multivitamin with minerals  1 tablet Oral Daily  . QUEtiapine  25 mg Oral QHS  . roflumilast  500 mcg Oral Daily   Continuous Infusions: . sodium chloride Stopped (01/31/20 1705)     LOS: 18 days    Time spent: 35 minutes    Irine Seal, MD [facility]   To contact the attending provider between 7A-7P or the covering provider during after hours 7P-7A, please log into the web site [email or web address] and access using universal [place] password for that web site. If you do not have the password, please call the hospital operator.  02/09/2020, 1:26 PM

## 2020-02-10 LAB — CBC WITH DIFFERENTIAL/PLATELET
Abs Immature Granulocytes: 0.01 10*3/uL (ref 0.00–0.07)
Basophils Absolute: 0 10*3/uL (ref 0.0–0.1)
Basophils Relative: 1 %
Eosinophils Absolute: 0.3 10*3/uL (ref 0.0–0.5)
Eosinophils Relative: 5 %
HCT: 39.6 % (ref 36.0–46.0)
Hemoglobin: 11.9 g/dL — ABNORMAL LOW (ref 12.0–15.0)
Immature Granulocytes: 0 %
Lymphocytes Relative: 23 %
Lymphs Abs: 1.2 10*3/uL (ref 0.7–4.0)
MCH: 29.5 pg (ref 26.0–34.0)
MCHC: 30.1 g/dL (ref 30.0–36.0)
MCV: 98.3 fL (ref 80.0–100.0)
Monocytes Absolute: 0.5 10*3/uL (ref 0.1–1.0)
Monocytes Relative: 9 %
Neutro Abs: 3.1 10*3/uL (ref 1.7–7.7)
Neutrophils Relative %: 62 %
Platelets: 210 10*3/uL (ref 150–400)
RBC: 4.03 MIL/uL (ref 3.87–5.11)
RDW: 13 % (ref 11.5–15.5)
WBC: 5 10*3/uL (ref 4.0–10.5)
nRBC: 0 % (ref 0.0–0.2)

## 2020-02-10 LAB — RENAL FUNCTION PANEL
Albumin: 3 g/dL — ABNORMAL LOW (ref 3.5–5.0)
Anion gap: 8 (ref 5–15)
BUN: 20 mg/dL (ref 6–20)
CO2: 36 mmol/L — ABNORMAL HIGH (ref 22–32)
Calcium: 9.2 mg/dL (ref 8.9–10.3)
Chloride: 98 mmol/L (ref 98–111)
Creatinine, Ser: 0.46 mg/dL (ref 0.44–1.00)
GFR, Estimated: >60 mL/min (ref >60)
Glucose, Bld: 97 mg/dL (ref 70–99)
Phosphorus: 5.2 mg/dL — ABNORMAL HIGH (ref 2.5–4.6)
Potassium: 4.2 mmol/L (ref 3.5–5.1)
Sodium: 142 mmol/L (ref 135–145)

## 2020-02-10 LAB — MAGNESIUM: Magnesium: 2.1 mg/dL (ref 1.7–2.4)

## 2020-02-10 LAB — GLUCOSE, CAPILLARY
Glucose-Capillary: 77 mg/dL (ref 70–99)
Glucose-Capillary: 89 mg/dL (ref 70–99)
Glucose-Capillary: 103 mg/dL — ABNORMAL HIGH (ref 70–99)

## 2020-02-10 MED ORDER — SENNOSIDES-DOCUSATE SODIUM 8.6-50 MG PO TABS
1.0000 | ORAL_TABLET | Freq: Two times a day (BID) | ORAL | Status: DC
  Administered 2020-02-10 – 2020-02-11 (×3): 1 via ORAL
  Filled 2020-02-10 (×3): qty 1

## 2020-02-10 MED ORDER — KETOROLAC TROMETHAMINE 30 MG/ML IJ SOLN
30.0000 mg | Freq: Two times a day (BID) | INTRAMUSCULAR | Status: DC | PRN
  Administered 2020-02-10 – 2020-02-11 (×3): 30 mg via INTRAVENOUS
  Filled 2020-02-10 (×3): qty 1

## 2020-02-10 MED ORDER — COVID-19 MRNA VACC (MODERNA) 100 MCG/0.5ML IM SUSP
0.5000 mL | Freq: Once | INTRAMUSCULAR | Status: DC
  Filled 2020-02-10: qty 0.5

## 2020-02-10 MED ORDER — POLYETHYLENE GLYCOL 3350 17 G PO PACK
17.0000 g | PACK | Freq: Every day | ORAL | Status: DC
  Administered 2020-02-10: 17 g via ORAL
  Filled 2020-02-10: qty 1

## 2020-02-10 NOTE — Progress Notes (Signed)
Mobility Specialist - Progress Note   02/10/20 1700  Mobility  Activity Contraindicated/medical hold  Mobility performed by Mobility specialist    Pt lying in bed upon arrival with IV Nursing Staff present in room to administer meds. Per discussion with nurse, pt c/o severe pain in back. Will hold off on session and attempt the next available date.    Kathee Delton Mobility Specialist 02/10/20, 5:05 PM

## 2020-02-10 NOTE — Plan of Care (Signed)
  Problem: Education: Goal: Knowledge of General Education information will improve Description: Including pain rating scale, medication(s)/side effects and non-pharmacologic comfort measures Outcome: Progressing   Problem: Activity: Goal: Risk for activity intolerance will decrease Outcome: Progressing   Problem: Pain Managment: Goal: General experience of comfort will improve Outcome: Not Progressing   Problem: Safety: Goal: Ability to remain free from injury will improve Outcome: Progressing   Problem: Skin Integrity: Goal: Risk for impaired skin integrity will decrease Outcome: Progressing   Problem: Health Behavior/Discharge Planning: Goal: Ability to manage health-related needs will improve Outcome: Progressing

## 2020-02-10 NOTE — Progress Notes (Signed)
PROGRESS NOTE    Misty Green  JAS:[id number] DOB: 1970-05-22 DOA: 01/22/2020 PCP: [name], MD    Chief Complaint  Patient presents with  . Shortness of Breath    Brief Narrative:  [name] is a 50 y.o. F with obesity, CHF, COPD, on home O2, and cardiac arrest who presented with dyspnea.  Patient was admitted for COPD flare, but then deteriorated and was intubated on hospital day 2.   1/4: Admitted, PCCM consulted due to high risk for intubation 1/6: Patient deteriorated, transfer to ICU, intubated 1/8 - 1/14: repeatedly failed SBT 1/16: extubated   Assessment & Plan:   Principal Problem:   Acute on chronic respiratory failure with hypoxia and hypercapnia (HCC) Active Problems:   COPD exacerbation ([place])   Anxiety   Tobacco user   Chronic obstructive pulmonary disease ([place])   Acute on chronic diastolic CHF (congestive heart failure) ([place])  1 acute on chronic respiratory failure with hypoxia and hypercarbia secondary to acute COPD exacerbation Clinical improvement.  Baseline home O2 of 3.5 L nasal cannula. Patient during the hospitalization deteriorated/decompensated requiring mechanical ventilation and extubated 02/03/2020.  COVID-19 PCR negative. -Currently on 3-4 L nasal cannula. -Was on IV steroids and currently has been weaned off steroids. -Continue Pulmicort/scheduled Saba/Sama/Mucinex/Singulair/Roflumilast/Pepcid. -Continue incentive spirometry -We will need outpatient follow-up with pulmonary, Dr. Lanney Gins on discharge.  2.  ICU delirium Improving.  Alert and oriented.  Seems appropriate.  3.  Acute on chronic diastolic CHF On presentation patient noted to have a 2 to 3-day history of progressive shortness of breath with associated orthopnea with evidence of acutely decompensated CHF on chest x-ray.  Patient diuresed with IV Lasix with clinical improvement.  Back on home dose oral Lasix which we will continue.  Outpatient follow-up with  cardiology.  4.  Chronic tobacco abuse Tobacco cessation.  Nicotine patch.  5.  Generalized anxiety disorder Continue Lexapro.  Xanax as needed.   6.  Chronic back pain Continue home regimen Neurontin.  Continue oxycodone 5 mg every 4 hours as needed as needed pain.  Place on IV Toradol 30 mg every 12 hours as needed pain.    DVT prophylaxis: Lovenox Code Status: Partial Family Communication: Updated patient and husband at bedside. Disposition:   Status is: Inpatient    Dispo: The patient is from: Home              Anticipated d/c is to: SNF              Anticipated d/c date is: 02/11/2020              Patient currently medically stable for discharge.       Consultants:   PCCM  Procedures:     Antimicrobials:  Anti-infectives (From admission, onward)   Start     Dose/Rate Route Frequency Ordered Stop   02/03/20 1400  azithromycin (ZITHROMAX) tablet 500 mg  Status:  Discontinued        500 mg Oral Daily 02/03/20 1304 02/04/20 0958   01/24/20 0945  azithromycin (ZITHROMAX) 500 mg in sodium chloride 0.9 % 250 mL IVPB        500 mg 250 mL/hr over 60 Minutes Intravenous Every 24 hours 01/24/20 0858 01/28/20 1029   01/24/20 0945  cefTRIAXone (ROCEPHIN) 1 g in sodium chloride 0.9 % 100 mL IVPB        1 g 200 mL/hr over 30 Minutes Intravenous Every 24 hours 01/24/20 0858 01/28/20 0913  Subjective: Laying in bed.  Denies any chest pain or shortness of breath.  No abdominal pain.  Stated IV pain medication given last night significantly helped her back pain that she did not require any further pain medication since last night and slept well.   Requesting for COVID vaccination prior to discharge.    Objective: Vitals:   02/09/20 1951 02/10/20 0008 02/10/20 0605 02/10/20 0915  BP: 135/87 120/65 133/80 125/73  Pulse: 84 86 97 84  Resp: 20 20 20 20   Temp: 98.2 F (36.8 C) 97.6 F (36.4 C) 98.1 F (36.7 C) 98.2 F (36.8 C)  TempSrc: Oral Oral Oral Oral  SpO2:  90% 100% 97% 94%  Weight:      Height:        Intake/Output Summary (Last 24 hours) at 02/10/2020 1226 Last data filed at 02/10/2020 0010 Gross per 24 hour  Intake 210 ml  Output 500 ml  Net -290 ml   Filed Weights   02/01/20 0309 02/02/20 0314 02/08/20 0415  Weight: 99.4 kg 97.7 kg 92.2 kg    Examination:  General exam: NAD Respiratory system: CTAB anterior lung fields.  No wheezes, no crackles, no rhonchi.   Cardiovascular system: Regular rate and rhythm no murmurs rubs or gallops.  No JVD.  No lower extremity edema. Gastrointestinal system: Abdomen is soft, nontender, nondistended, positive bowel sounds.  No rebound.  No guarding.  Central nervous system: Alert and oriented. No focal neurological deficits. Extremities: Symmetric 5 x 5 power. Skin: No rashes, lesions or ulcers Psychiatry: Judgement and insight appear normal. Mood & affect appropriate.     Data Reviewed: I have personally reviewed following labs and imaging studies  CBC: Recent Labs  Lab 02/06/20 0659 02/07/20 0549 02/08/20 0613 02/09/20 0427 02/10/20 0822  WBC 7.9 8.1 7.0 7.5 5.0  NEUTROABS 5.4 5.5 4.6 4.8 3.1  HGB 11.6* 12.3 13.3 12.7 11.9*  HCT 37.8 41.6 43.3 39.6 39.6  MCV 99.5 101.0* 98.0 95.9 98.3  PLT 248 243 233 229 440    Basic Metabolic Panel: Recent Labs  Lab 02/06/20 0659 02/07/20 0549 02/08/20 0613 02/09/20 0427 02/10/20 0822  NA 146* 144 139 141 142  K 3.3* 3.5 3.6 3.4* 4.2  CL 96* 93* 91* 95* 98  CO2 40* 40* 36* 34* 36*  GLUCOSE 136* 91 88 86 97  BUN 9 12 13 15 20   CREATININE 0.40* 0.36* 0.50 0.46 0.46  CALCIUM 9.4 9.1 9.2 9.2 9.2  MG 1.8 2.1 1.9 1.9 2.1  PHOS 4.6 5.2* 3.8 3.8 5.2*    GFR: Estimated Creatinine Clearance: 92.6 mL/min (by C-G formula based on SCr of 0.46 mg/dL).  Liver Function Tests: Recent Labs  Lab 02/06/20 0659 02/07/20 0549 02/08/20 0613 02/09/20 0427 02/10/20 0822  ALBUMIN 3.0* 3.0* 3.0* 3.2* 3.0*    CBG: Recent Labs  Lab  02/09/20 1155 02/09/20 1651 02/09/20 2201 02/10/20 0735 02/10/20 1152  GLUCAP 93 93 135* 77 89     No results found for this or any previous visit (from the past 240 hour(s)).       Radiology Studies: No results found.      Scheduled Meds: . budesonide (PULMICORT) nebulizer solution  0.5 mg Nebulization BID  . chlorhexidine  15 mL Mouth Rinse BID  . Chlorhexidine Gluconate Cloth  6 each Topical Q0600  . enoxaparin (LOVENOX) injection  0.5 mg/kg Subcutaneous Q24H  . escitalopram  20 mg Oral Daily  . famotidine  20 mg Oral BID  . feeding  supplement  237 mL Oral TID BM  . fluticasone  1 spray Each Nare QHS  . furosemide  40 mg Oral Daily  . gabapentin  400 mg Oral TID  . guaiFENesin  1,200 mg Oral BID  . hydrOXYzine  25 mg Oral TID  . insulin aspart  0-15 Units Subcutaneous TID WC  . ipratropium-albuterol  3 mL Nebulization TID  . mouth rinse  15 mL Mouth Rinse q12n4p  . montelukast  10 mg Oral QHS  . multivitamin with minerals  1 tablet Oral Daily  . pantoprazole  40 mg Oral BID  . QUEtiapine  25 mg Oral QHS  . roflumilast  500 mcg Oral Daily  . Vitamin D (Ergocalciferol)  50,000 Units Oral Weekly   Continuous Infusions: . sodium chloride Stopped (01/31/20 1705)     LOS: 19 days    Time spent: 35 minutes    Misty Seal, MD [facility]   To contact the attending provider between 7A-7P or the covering provider during after hours 7P-7A, please log into the web site [email or web address] and access using universal [place] password for that web site. If you do not have the password, please call the hospital operator.  02/10/2020, 12:26 PM

## 2020-02-10 NOTE — Plan of Care (Signed)
  Problem: Education: Goal: Knowledge of General Education information will improve Description: Including pain rating scale, medication(s)/side effects and non-pharmacologic comfort measures Outcome: Progressing   Problem: Activity: Goal: Risk for activity intolerance will decrease Outcome: Progressing   Problem: Pain Managment: Goal: General experience of comfort will improve Outcome: Progressing   Problem: Pain Managment: Goal: General experience of comfort will improve Outcome: Progressing   Problem: Safety: Goal: Ability to remain free from injury will improve Outcome: Progressing   Problem: Skin Integrity: Goal: Risk for impaired skin integrity will decrease Outcome: Progressing   Problem: Health Behavior/Discharge Planning: Goal: Ability to manage health-related needs will improve Outcome: Progressing

## 2020-02-11 LAB — CBC WITH DIFFERENTIAL/PLATELET
Abs Immature Granulocytes: 0.01 10*3/uL (ref 0.00–0.07)
Basophils Absolute: 0 10*3/uL (ref 0.0–0.1)
Basophils Relative: 1 %
Eosinophils Absolute: 0.3 10*3/uL (ref 0.0–0.5)
Eosinophils Relative: 6 %
HCT: 36.7 % (ref 36.0–46.0)
Hemoglobin: 11.6 g/dL — ABNORMAL LOW (ref 12.0–15.0)
Immature Granulocytes: 0 %
Lymphocytes Relative: 34 %
Lymphs Abs: 1.6 10*3/uL (ref 0.7–4.0)
MCH: 30.2 pg (ref 26.0–34.0)
MCHC: 31.6 g/dL (ref 30.0–36.0)
MCV: 95.6 fL (ref 80.0–100.0)
Monocytes Absolute: 0.4 10*3/uL (ref 0.1–1.0)
Monocytes Relative: 9 %
Neutro Abs: 2.5 10*3/uL (ref 1.7–7.7)
Neutrophils Relative %: 50 %
Platelets: 200 10*3/uL (ref 150–400)
RBC: 3.84 MIL/uL — ABNORMAL LOW (ref 3.87–5.11)
RDW: 13.1 % (ref 11.5–15.5)
WBC: 4.8 10*3/uL (ref 4.0–10.5)
nRBC: 0 % (ref 0.0–0.2)

## 2020-02-11 LAB — RENAL FUNCTION PANEL
Albumin: 3.1 g/dL — ABNORMAL LOW (ref 3.5–5.0)
Anion gap: 14 (ref 5–15)
BUN: 27 mg/dL — ABNORMAL HIGH (ref 6–20)
CO2: 33 mmol/L — ABNORMAL HIGH (ref 22–32)
Calcium: 9.5 mg/dL (ref 8.9–10.3)
Chloride: 92 mmol/L — ABNORMAL LOW (ref 98–111)
Creatinine, Ser: 0.5 mg/dL (ref 0.44–1.00)
GFR, Estimated: >60 mL/min (ref >60)
Glucose, Bld: 90 mg/dL (ref 70–99)
Phosphorus: 5.6 mg/dL — ABNORMAL HIGH (ref 2.5–4.6)
Potassium: 3.6 mmol/L (ref 3.5–5.1)
Sodium: 139 mmol/L (ref 135–145)

## 2020-02-11 LAB — SARS CORONAVIRUS 2 (TAT 6-24 HRS): SARS Coronavirus 2: NEGATIVE

## 2020-02-11 LAB — MAGNESIUM: Magnesium: 1.9 mg/dL (ref 1.7–2.4)

## 2020-02-11 LAB — GLUCOSE, CAPILLARY: Glucose-Capillary: 73 mg/dL (ref 70–99)

## 2020-02-11 MED ORDER — HYDROXYZINE HCL 25 MG PO TABS: 25.0000 mg | ORAL_TABLET | Freq: Three times a day (TID) | ORAL | 0 refills | Status: DC

## 2020-02-11 MED ORDER — COVID-19 MRNA VAC-TRIS(PFIZER) 30 MCG/0.3ML IM SUSP
0.3000 mL | Freq: Once | INTRAMUSCULAR | Status: AC
  Administered 2020-02-11: 0.3 mL via INTRAMUSCULAR
  Filled 2020-02-11: qty 0.3

## 2020-02-11 MED ORDER — POLYETHYLENE GLYCOL 3350 17 G PO PACK: 17.0000 g | PACK | Freq: Every day | ORAL | 0 refills | Status: AC

## 2020-02-11 MED ORDER — QUETIAPINE FUMARATE 25 MG PO TABS: 25.0000 mg | ORAL_TABLET | Freq: Every day | ORAL | Status: DC

## 2020-02-11 MED ORDER — NICOTINE 14 MG/24HR TD PT24: 14.0000 mg | MEDICATED_PATCH | Freq: Every day | TRANSDERMAL | 0 refills | Status: DC | PRN

## 2020-02-11 MED ORDER — GUAIFENESIN-CODEINE 100-10 MG/5ML PO SOLN: 10.0000 mL | Freq: Four times a day (QID) | ORAL | 0 refills | Status: DC | PRN

## 2020-02-11 MED ORDER — POTASSIUM CHLORIDE 20 MEQ PO PACK
40.0000 meq | PACK | Freq: Once | ORAL | Status: AC
  Administered 2020-02-11: 40 meq via ORAL
  Filled 2020-02-11: qty 2

## 2020-02-11 MED ORDER — IPRATROPIUM-ALBUTEROL 0.5-2.5 (3) MG/3ML IN SOLN: 3.0000 mL | Freq: Three times a day (TID) | RESPIRATORY_TRACT | Status: DC

## 2020-02-11 MED ORDER — FLUTICASONE PROPIONATE 50 MCG/ACT NA SUSP: 1.0000 | Freq: Every day | NASAL | 2 refills | Status: DC

## 2020-02-11 MED ORDER — BUDESONIDE 0.5 MG/2ML IN SUSP: 0.5000 mg | Freq: Two times a day (BID) | RESPIRATORY_TRACT | 0 refills | Status: DC

## 2020-02-11 MED ORDER — TRAMADOL HCL 50 MG PO TABS: 100.0000 mg | ORAL_TABLET | Freq: Four times a day (QID) | ORAL | 0 refills | Status: AC | PRN

## 2020-02-11 MED ORDER — SENNOSIDES-DOCUSATE SODIUM 8.6-50 MG PO TABS: 1.0000 | ORAL_TABLET | Freq: Every evening | ORAL | Status: DC | PRN

## 2020-02-11 MED ORDER — ENSURE ENLIVE PO LIQD: 237.0000 mL | Freq: Three times a day (TID) | ORAL | 0 refills | Status: DC

## 2020-02-11 NOTE — TOC Progression Note (Signed)
Transition of Care [facility]) - Progression Note    Patient Details  Name: Misty Green MRN: [id number] Date of Birth: [birth year]  [facility] CM/SW Contact  Eileen Stanford, LCSW Phone Number: 02/11/2020, 10:18 AM  Clinical Narrative:    CSW spoke with pt's daughter and they are requesting that pt's referral be sent to [facility] in [place]. Pt's daughter states she has already spoken to [place] [facility] says they have a bed for pt. CSW has sent referral and left Debbie a voicemail.    Expected Discharge Plan: [place] Barriers to Discharge: Continued Medical Work up  Expected Discharge Plan and Services Expected Discharge Plan: [place] arrangements for the past 2 months: Single Family Home                                       Social Determinants of Health (SDOH) Interventions    Readmission Risk Interventions Readmission Risk Prevention Plan 05/23/2018 05/15/2018  Transportation Screening Complete (No Data)  Medication Review Press photographer) Complete -  PCP or Specialist appointment within 3-5 days of discharge Complete -  [place] or Home Care Consult Complete -  SW Recovery Care/Counseling Consult Complete -  [place] Patient Refused -  Some recent data might be hidden

## 2020-02-11 NOTE — Plan of Care (Signed)
Patient discharging to [place] facility.  Report call to nurse to review discharge instructions and discharge instruction and medication review placed in packet for transport .who verbalized understanding and acceptance.  EMS to transport at 1600.  Patient aware and ready.

## 2020-02-11 NOTE — Plan of Care (Signed)
Patient transported by EMS.  PIV removed prior to transport

## 2020-02-11 NOTE — Progress Notes (Signed)
Occupational Therapy Treatment Patient Details Name: Misty Green MRN: [id number] DOB: 03/30/1970 Today's Date: 02/11/2020    History of present illness Pt is a 50 y.o. female presenting to hospital 1/4 with SOB.  Pt admitted with acute on chronic hypoxic hypercapnic respiratory failure in setting of acute COPD exacerbation and acute on chronic diastolic heart failure.  Pt transferred to ICU 1/6 d/t worsening respiratory status; intubated 1/6; weaned from vent 1/16.  PMH includes cardiac arrest, CHF, COPD, chronic fatigue; anxiety disorder, edema, OSA, anemia of chronic disease, hypoxic encephalopathy, anoxic brain injury, obesity, chronic back pain, R distal radius fx and nondisplaced fx styloid proces of R ulna 2017 (ORIF wrist fx R), back surgery, and colostomy.   OT comments  [name] was pleasant and agreeable to today's session focused on self care and bathing.  Pt continues to present with generalized weakness and low endurance that impacts her ability to safely and independently engage in functional tasks.  OTR facilitated sponge bathing at bedlevel to promote functional strengthening and improved independence in self care.  OTR provided generally min assist for pt to bathe self, providing assistance to wash back.  Pt was able to wash arms, legs, trunk, and face.  OTR provided supervision assist for pt to complete upper body dressing at bedlevel as pt had increased work of breathing and effort, moderate assist for pt to don pants and socks at bedlevel.  Pt is able to complete rolling with supervision assist.  Pt with some increased work of breathing with heavy effort, with SpO2 lowering to 92-94%, but is able to recover quickly with rest.  Pt will continue to benefit from skilled OT services in acute setting to address endurance, functional strengthening, and safety and independence in ADLs.  SNF remains most appropriate discharge recommendation.   Follow Up Recommendations  SNF     Equipment Recommendations  3 in 1 bedside commode    Recommendations for Other Services      Precautions / Restrictions Precautions Precautions: Fall Precaution Comments: L IJ CVC; LLQ colostomy Restrictions Weight Bearing Restrictions: No       Mobility Bed Mobility Overal bed mobility: Needs Assistance Bed Mobility: Rolling Rolling: Supervision         General bed mobility comments: Pt able to complete rolling for sponge bathing with supervision assist from OTR  Transfers                      Balance Overall balance assessment: Needs assistance                                         ADL either performed or assessed with clinical judgement   ADL Overall ADL's : Needs assistance/impaired Eating/Feeding: Set up;Supervision/ safety;Sitting   Grooming: Sitting;Set up;Supervision/safety;Wash/dry face Grooming Details (indicate cue type and reason): Increased time/effort to perform. Upper Body Bathing: Sitting;Minimal assistance Upper Body Bathing Details (indicate cue type and reason): OTR provided min assist to wash back Lower Body Bathing: Minimal assistance Lower Body Bathing Details (indicate cue type and reason): Pt able to wash upper and lower legs + feet at bedlevel, OTR provided min assist to wash backs of legs Upper Body Dressing : Supervision/safety Upper Body Dressing Details (indicate cue type and reason): OTR provided standby assist as pt with increased time/effort Lower Body Dressing: Bed level;Moderate assistance Lower Body Dressing Details (indicate cue type and  reason): OTR provided moderate assist at bedlevel to don pants/socks.  Pt able to thread B legs through pants and bring to hips.  OTR provided slight assist to pull over hips and to don socks at bedlevel (pt able to bring socks to figure 4 position while at bedlevel position).               General ADL Comments: Pt able to complete sponge bathing with grossly min  assist from OTR, required supervision for upper body dressing and mod A for lower body dressing at bedlevel.     Vision Patient Visual Report: No change from baseline     Perception     Praxis      Cognition Arousal/Alertness: Awake/alert Behavior During Therapy: WFL for tasks assessed/performed Overall Cognitive Status: Impaired/Different from baseline Area of Impairment: Memory                     Memory: Decreased short-term memory         General Comments: pt grossly oriented x4, observed short term memory deficits by pt repeating similar stories multiple times during session        Exercises Other Exercises Other Exercises: OTR provided min assist for sponge bathing at bedlevel, standby for upper body dressing and moderate assist for lower body dressing   Shoulder Instructions       General Comments Increased work of breathing with SpO2 lowering to 92-94% with activity, able to quickly recover with rest (on 3.5L O2)    Pertinent Vitals/ Pain       Pain Intervention(s): Patient requesting pain meds-RN notified (pt requested pain medications while RN in room, did not specify location or rating of pain (had denied pain earlier in session when prompted by OTR))  Home Living                                          Prior Functioning/Environment              Frequency  Min 1X/week        Progress Toward Goals  OT Goals(current goals can now be found in the care plan section)  Progress towards OT goals: Progressing toward goals  Acute Rehab OT Goals Patient Stated Goal: to improve strength and walking OT Goal Formulation: With patient Time For Goal Achievement: 02/20/20 Potential to Achieve Goals: Good  Plan Discharge plan remains appropriate;Frequency remains appropriate    Co-evaluation                 AM-PAC OT "6 Clicks" Daily Activity     Outcome Measure   Help from another person eating meals?: None Help  from another person taking care of personal grooming?: A Little Help from another person toileting, which includes using toliet, bedpan, or urinal?: A Lot Help from another person bathing (including washing, rinsing, drying)?: A Little Help from another person to put on and taking off regular upper body clothing?: None Help from another person to put on and taking off regular lower body clothing?: A Lot 6 Click Score: 18    End of Session Equipment Utilized During Treatment: Oxygen  OT Visit Diagnosis: Other abnormalities of gait and mobility (R26.89);Other symptoms and signs involving cognitive function   Activity Tolerance Patient tolerated treatment well   Patient Left in bed;with call bell/phone within reach;with bed alarm set   Nurse  Communication          Time: 1674-2552 OT Time Calculation (min): 43 min  Charges: OT General Charges $OT Visit: 1 Visit OT Treatments $Self Care/Home Management : 38-52 mins  Myrtie Hawk Najah Liverman, OTR/L 02/11/20, 12:59 PM

## 2020-02-11 NOTE — Progress Notes (Signed)
Nutrition Follow-up  DOCUMENTATION CODES:   Obesity unspecified  INTERVENTION:   Ensure Enlive po TID, each supplement provides 350 kcal and 20 grams of protein  MVI po daily   NUTRITION DIAGNOSIS:   Inadequate oral intake related to inability to eat (pt sedated and ventilated) as evidenced by NPO status. -resolved   GOAL:   Patient will meet greater than or equal to 90% of their needs -progressing   MONITOR:   PO intake,Supplement acceptance,Labs,Weight trends,I & O's,Skin  ASSESSMENT:   50 y.o. female with medical history significant for diverticulitis s/p colostomy, substance abuse, chronic hypoxic hypercapnic respiratory failure on 3.5 L continuous nasal cannula, severe COPD, chronic diastolic heart failure, chronic tobacco abuse and generalized anxiety disorder who is admitted to [facility] on 01/22/2020 with acute on chronic hypoxic hypercapnic respiratory failure in the setting of acute COPD exacerbation and acute on chronic diastolic heart failure   Pt advanced to a dysphagia 3 diet on 1/19. Pt eating anywhere from 25-100% of meals in hospital and is drinking Ensure supplements. Pt ate 100% of her breakfast this morning. Per chart, pt has remained fairly weight stable since admit. Pt to discharge today.   Medications reviewed and include: lovenox, pepcid, lasix, insulin, protonix, MVI, miralax, senokot, vitamin D  Labs reviewed: K 3.6 wnl, P 5.6(H)  Diet Order:   Diet Order            Diet - low sodium heart healthy           DIET DYS 3 Room service appropriate? Yes with Assist; Fluid consistency: Thin  Diet effective now                EDUCATION NEEDS:   No education needs have been identified at this time  Skin:  Skin Assessment: Reviewed RN Assessment  Last BM:  1/23- type 6  Height:   Ht Readings from Last 1 Encounters:  01/29/20 5' 4.02" (1.626 m)   Weight:   Wt Readings from Last 1 Encounters:  02/11/20 94.8 kg   Ideal  Body Weight:  54.5 kg  BMI:  Body mass index is 35.86 kg/m.  Estimated Nutritional Needs:   Kcal:  2000-2300kcal/day  Protein:  100-115g/day  Fluid:  1.6L/day  Koleen Distance MS, RD, LDN Please refer to [facility] for RD and/or RD on-call/weekend/after hours pager

## 2020-02-11 NOTE — TOC Transition Note (Signed)
Transition of Care [facility]) - CM/SW Discharge Note   Patient Details  Name: Misty Green MRN: [id number] Date of Birth: 1970/07/24  [facility] of [facility] CM/SW Contact:  Eileen Stanford, LCSW Phone Number: 02/11/2020, 2:47 PM   Clinical Narrative:  Clinical Social Worker facilitated patient discharge including contacting patient family and facility to confirm patient discharge plans.  Clinical information faxed to facility and family agreeable with plan.  CSW arranged ambulance transport via [facility] to [facility] in [place].  RN to call [phone] for report prior to discharge.      Final next level of care: Skilled Nursing Facility Barriers to Discharge: No Barriers Identified   Patient Goals and CMS Choice Patient states their goals for this hospitalization and ongoing recovery are:: home with home health CMS Medicare.gov Compare Post Acute Care list provided to:: Patient Represenative (must comment) Choice offered to / list presented to : Spouse  Discharge Placement              Patient chooses bed at:  [facility] in [place]) Patient to be transferred to facility by: [facility] Name of family member notified: daughter Patient and family notified of of transfer: 02/11/20  Discharge Plan and Services                                     Social Determinants of Health ([place]) Interventions     Readmission Risk Interventions Readmission Risk Prevention Plan 05/23/2018 05/15/2018  Transportation Screening Complete (No Data)  Medication Review [employer] photographer) Complete -  PCP or Specialist appointment within 3-5 days of discharge Complete -  [place] or Home Care Consult Complete -  SW Recovery Care/Counseling Consult Complete -  [place] Patient Refused -  Some recent data might be hidden

## 2020-02-11 NOTE — Discharge Summary (Signed)
Physician Discharge Summary  Misty Green KGY:[id number] DOB: 1970/11/14 DOA: 01/22/2020  PCP: [name], MD  Admit date: 01/22/2020 Discharge date: 02/11/2020  Time spent: 50 minutes  Recommendations for Outpatient Follow-up:  1. Follow-up with Dr. Lanney Gins, pulmonary in 1 to 2 weeks. 2. Follow-up with MD at skilled nursing facility. 3. Follow-up with [place] [facility] failure [facility] 02/19/2020.   Discharge Diagnoses:  Principal Problem:   Acute on chronic respiratory failure with hypoxia and hypercapnia (HCC) Active Problems:   COPD exacerbation (HCC)   Anxiety   Tobacco user   Chronic obstructive pulmonary disease (HCC)   Acute on chronic diastolic CHF (congestive heart failure) ([place])   Discharge Condition: Stable and improved  Diet recommendation: Dysphagia 3 diet with thin liquids.  Filed Weights   02/02/20 0314 02/08/20 0415 02/11/20 0550  Weight: 97.7 kg 92.2 kg 94.8 kg    History of present illness:  HPI per Dr. Alveta Heimlich is a 50 y.o. female with medical history significant for chronic hypoxic hypercapnic respiratory failure on 3.5 L continuous nasal cannula, severe COPD, chronic diastolic heart failure, chronic tobacco abuse, generalized anxiety disorder, who is admitted to [facility] on 01/22/2020 with acute on chronic hypoxic hypercapnic respiratory failure in the setting of acute COPD exacerbation acute on chronic diastolic heart failure after presenting from home to [facility] Emergency Department complaining of shortness of breath.   The patient reports 2- 3 days of progressive shortness of breath associated with mild nonproductive cough.  She also notes associated orthopnea without significant increase in edema bilateral lower extremities.  Denies any associated subjective fever, chills, rigors, or generalized myalgias.  Reports chronic rhinitis in the setting of allergic rhinitis, but denies any recent  sore throat, wheezing, nausea, vomiting, abdominal pain, diarrhea, or rash.  Denies any recent headache or neck stiffness.  No recent traveling or known COVID-19 exposures.  Denies any recent calf tenderness or lower extremity erythema.  She also denies any recent chest pain, diaphoresis, palpitations.  No recent hemoptysis or pleuritic chest discomfort.  Confirms her baseline supplemental oxygen requirement of continuous 3.5 L nasal cannula, and reports good compliance with her home respiratory regimen which consists of Trelegy Ellipta, Daliresp, Singulair, and as needed albuterol inhaler.  She acknowledges that she continues to smoke, reporting that she has been able to decrease her daily volume of smoking slightly down to approximately a quarter pack per day.   Medical history is also notable for history of chronic diastolic heart failure, with most recent echocardiogram appearing to have occurred in March 2021, and showed normal left ventricular size, normal left ventricular wall thickness, LVEF 55 to 60%, and no evidence of focal wall motion abnormalities, also not discussing diastolic function.  The patient reports good compliance with her home diuretic regimen which consists of Lasix 20 mg p.o. twice daily.   She notes increased frequency of use of her as needed albuterol inhaler over the last day, and in the setting of further progression of her shortness of breath in spite of good compliance with her home regimen and increased use of rescue inhaler, she elected to present to [facility] ED today for further evaluation of her worsening shortness of breath.     ED Course:  Vital signs in the ED were notable for the following: Temperature max 97.7, heart rate 76-85; blood pressure 158/77, respiratory rate 20-25; initial oxygen saturation on her baseline continuous 2.5 L nasal cannula was in the mid 60s, with  ensuing improvement into the mid 90s on 4 to 5 L nasal cannula, before supplemental oxygen  delivery was transitioned to BiPAP for enhanced patient comfort as well as management of acute on chronic hypercapnia.   Labs were notable for the following: Presenting VBG on her baseline continuous 3.5 L nasal cannula showed the following: 7.28/greater than 120.  CMP was notable for the following: Sodium 141, potassium 4.4, bicarbonate 41, creatinine 0.52.  High-sensitivity troponin high x1 found to be 8.  CBC notable for white blood cell count of 8100, hemoglobin 13.6.  EKG showed sinus arrhythmia with ventricular rate 76, nonspecific T wave inversion in V1, and no evidence of ST changes, including no evidence of ST elevation.  Chest x-ray showed increase in central pulmonary vascular congestion as well as interlobular septal thickening with reticular opacities consistent with acute congestive heart failure, while showing no evidence of pneumothorax or large pleural effusion.  Nasopharyngeal COVID-19/influenza PCR performed in the ED today were found to be negative.  While in the ED, the following were administered: Lasix 40 mg IV x1, duo nebulizer treatment x1, Solu-Medrol 125 mg IV x1, and magnesium sulfate 2 g IV over 2 hours x 1 dose.     Hospital Course:  1 acute on chronic respiratory failure with hypoxia and hypercarbia secondary to acute COPD exacerbation Clinical improvement.  Baseline home O2 of 3.5 L nasal cannula. Patient during the hospitalization deteriorated/decompensated requiring mechanical ventilation and extubated 02/03/2020.  COVID-19 PCR negative. -Patient's oxygenation was weaned down and patient was back to home based oxygen of 3 to 4 L nasal cannula.  -Was on IV steroids and currently has been weaned off steroids. -Patient maintained on Pulmicort/scheduled Saba/Sama/Mucinex/Singulair/Roflumilast/Pepcid. -Continue incentive spirometry -We will need outpatient follow-up with pulmonary, Dr. Lanney Gins on discharge.  2.  ICU delirium Improved.  Back at baseline by day  of discharge.   3.  Acute on chronic diastolic CHF On presentation patient noted to have a 2 to 3-day history of progressive shortness of breath with associated orthopnea with evidence of acutely decompensated CHF on chest x-ray.  Patient diuresed with IV Lasix with clinical improvement.    Patient transition back to home dose oral Lasix which will be discharged home on.  Outpatient follow-up with cardiology.   4.  Chronic tobacco abuse Tobacco cessation stressed to patient.    Patient was placed on a nicotine patch.  5.  Generalized anxiety disorder Patient maintained on home regimen Lexapro.  Xanax as needed.   6.  Chronic back pain Patient was started back on home regimen Neurontin.    Patient also placed on oxycodone as needed for breakthrough pain as well as IV Toradol as needed for severe pain.  Patient was discharged on Ultram as needed in addition to home regimen of Neurontin.  Outpatient follow-up.     Procedures:  Intubated and extubated.  Consultations:  PCCM  Discharge Exam: Vitals:   02/11/20 1224 02/11/20 1331  BP: (!) 145/71   Pulse: 76   Resp: 18   Temp: 98.3 F (36.8 C)   SpO2: 98% 98%    General: NAD Cardiovascular: RRR Respiratory: CTAB  Discharge Instructions   Discharge Instructions    Diet - low sodium heart healthy   Complete by: As directed    Dysphagia 3 with thin liquids.   Increase activity slowly   Complete by: As directed      Allergies as of 02/11/2020      Reactions   Other Nausea And Vomiting  Anti-depressent that starts with t      Medication List    STOP taking these medications   amLODipine 10 MG tablet Commonly known as: NORVASC     TAKE these medications   acetaminophen 325 MG tablet Commonly known as: TYLENOL Take 1-2 tablets (325-650 mg total) by mouth every 4 (four) hours as needed for mild pain. What changed:   how much to take  when to take this   albuterol 108 (90 Base) MCG/ACT inhaler Commonly  known as: VENTOLIN HFA Inhale 2 puffs into the lungs every 6 (six) hours as needed for wheezing or shortness of breath.   albuterol (2.5 MG/3ML) 0.083% nebulizer solution Commonly known as: PROVENTIL Take 2.5 mg by nebulization every 6 (six) hours as needed for wheezing or shortness of breath. Inhale one vial via nebulizer 4 times a day as directed.   budesonide 0.5 MG/2ML nebulizer solution Commonly known as: PULMICORT Take 2 mLs (0.5 mg total) by nebulization 2 (two) times daily.   Daliresp 500 MCG Tabs tablet Generic drug: roflumilast TAKE 1 TABLET BY MOUTH DAILY What changed: how much to take   escitalopram 20 MG tablet Commonly known as: LEXAPRO TAKE 1 TABLET BY MOUTH EVERY DAY   feeding supplement Liqd Take 237 mLs by mouth 3 (three) times daily between meals.   fluticasone 50 MCG/ACT nasal spray Commonly known as: FLONASE Place 1 spray into both nostrils at bedtime.   furosemide 40 MG tablet Commonly known as: LASIX Take 40 mg by mouth daily.   gabapentin 400 MG capsule Commonly known as: NEURONTIN Take 1 capsule (400 mg total) by mouth 3 (three) times daily.   guaiFENesin-codeine 100-10 MG/5ML syrup Take 10 mLs by mouth every 6 (six) hours as needed for cough.   hydrOXYzine 25 MG tablet Commonly known as: ATARAX/VISTARIL Take 1 tablet (25 mg total) by mouth 3 (three) times daily.   ipratropium-albuterol 0.5-2.5 (3) MG/3ML Soln Commonly known as: DUONEB Take 3 mLs by nebulization 3 (three) times daily.   Melatonin 10 MG Tabs Take 30 mg by mouth at bedtime.   montelukast 10 MG tablet Commonly known as: SINGULAIR TAKE 1 TABLET BY MOUTH EVERYDAY AT BEDTIME What changed: See the new instructions.   multivitamin with minerals tablet Take 1 tablet by mouth at bedtime.   nicotine 14 mg/24hr patch Commonly known as: NICODERM CQ - dosed in mg/24 hours Place 1 patch (14 mg total) onto the skin daily as needed (for smoking cessation).   Ozempic (0.25 or 0.5  MG/DOSE) 2 MG/1.5ML Sopn Generic drug: Semaglutide(0.25 or 0.5MG /DOS) Inject 0.25 mg into the skin once a week.   pantoprazole 40 MG tablet Commonly known as: PROTONIX Take 40 mg by mouth 2 (two) times daily.   polyethylene glycol 17 g packet Commonly known as: MIRALAX / GLYCOLAX Take 17 g by mouth daily. Start taking on: February 12, 2020   PRESCRIPTION MEDICATION Inhale into the lungs at bedtime. BiPap   QUEtiapine 25 MG tablet Commonly known as: SEROQUEL Take 1 tablet (25 mg total) by mouth at bedtime. What changed:   medication strength  See the new instructions.   senna-docusate 8.6-50 MG tablet Commonly known as: Senokot-S Take 1 tablet by mouth at bedtime as needed for mild constipation.   Trelegy Ellipta 100-62.5-25 MCG/INH Aepb Generic drug: Fluticasone-Umeclidin-Vilant Inhale 1 each into the lungs daily.   Vitamin D (Ergocalciferol) 1.25 MG (50000 UNIT) Caps capsule Commonly known as: DRISDOL Take 50,000 Units by mouth once a week.  Allergies  Allergen Reactions  . Other Nausea And Vomiting    Anti-depressent that starts with t    Contact information for follow-up providers    [facility] Follow up on 02/19/2020.   Specialty: Cardiology Why: at 3:00pm. Enter through the [facility] entrance Contact information: [place]       Ottie Glazier, MD. Schedule an appointment as soon as possible for a visit in 2 week(s).   Specialty: Pulmonary Disease Why: Follow-up in 1 to 2 weeks. Contact information: [place] [address] [phone]            Contact information for after-discharge care    [facility] .   Service: Skilled Nursing Contact information: [address] [place] [phone]                   The results of  significant diagnostics from this hospitalization (including imaging, microbiology, ancillary and laboratory) are listed below for reference.    Significant Diagnostic Studies: DG Abd 1 View  Result Date: 01/24/2020 CLINICAL DATA:  Orogastric tube placement. EXAM: ABDOMEN - 1 VIEW COMPARISON:  January 06, 2018. FINDINGS: The bowel gas pattern is normal. Distal tip of nasogastric tube is seen in expected position of distal stomach. No radio-opaque calculi or other significant radiographic abnormality are seen. IMPRESSION: Distal tip of nasogastric tube seen in expected position of distal stomach. Electronically Signed   By: Marijo Conception M.D.   On: 01/24/2020 15:04   DG Chest Port 1 View  Result Date: 02/04/2020 CLINICAL DATA:  Shortness of breath EXAM: PORTABLE CHEST 1 VIEW COMPARISON:  February 03, 2020 FINDINGS: The patient has been extubated. The left-sided central venous catheter is stable. There are improving airspace opacities bilaterally. No pneumothorax. There may be small bilateral pleural effusions. No acute osseous abnormality. The heart size is stable. IMPRESSION: 1. Improving bilateral airspace opacities. 2. Stable left-sided central venous catheter. 3. No pneumothorax. Electronically Signed   By: Constance Holster M.D.   On: 02/04/2020 04:34   DG Chest Port 1 View  Result Date: 02/03/2020 CLINICAL DATA:  Intubated, hypoxia and hypercapnia EXAM: PORTABLE CHEST 1 VIEW COMPARISON:  01/27/2020 chest radiograph. FINDINGS: Endotracheal tube tip is 3.4 cm above the carina. Left internal jugular central venous catheter terminates in lower third of the SVC. Enteric tube enters stomach with the tip not seen on this image. Stable cardiomediastinal silhouette with top-normal heart size. No pneumothorax. No pleural effusion. No overt pulmonary edema. Hazy bibasilar lung opacities, mildly increased on the left. IMPRESSION: 1. Well-positioned support structures. 2. Hazy bibasilar lung opacities, mildly  increased on the left, favor atelectasis. Electronically Signed   By: Ilona Sorrel M.D.   On: 02/03/2020 07:55   DG Chest Port 1 View  Result Date: 01/27/2020 CLINICAL DATA:  Acute respiratory failure EXAM: PORTABLE CHEST 1 VIEW COMPARISON:  Radiograph 01/26/2020, CT 06/05/2018 FINDINGS: Endotracheal tube tip terminates in the low trachea, 2.5 cm from the carina. Transesophageal tube tip and side port terminate below the GE junction, beyond the margins of imaging. A left IJ approach central venous catheter tip terminates near the superior cavoatrial junction. Persistent low volumes and atelectatic changes with heterogeneous interstitial and airspace opacities in both lungs most coalescent towards the medial right lung base. No visible pneumothorax or effusion. Stable cardiomediastinal contours with a calcified aorta.  No acute osseous or soft tissue abnormality. IMPRESSION: 1. Endotracheal tube tip terminates in the low trachea, 2.5 cm from the carina. Could be retracted 1-2 cm to the mid trachea. 2. Transesophageal tube tip and side port terminate below the GE junction, beyond the margins of imaging. 3. Left IJ approach central venous catheter terminates at the superior cavoatrial junction. 4. Persistently low lung volumes and atelectasis with vascular congestion and mixed heterogeneous opacities in both lungs which could reflect airspace disease and/or edema, similar to prior. Electronically Signed   By: Lovena Le M.D.   On: 01/27/2020 04:25   DG Chest Port 1 View  Result Date: 01/26/2020 CLINICAL DATA:  Acute respiratory failure EXAM: PORTABLE CHEST 1 VIEW COMPARISON:  Radiograph 01/24/2020 FINDINGS: Left IJ approach central venous catheter tip terminates near the superior cavoatrial junction. Transesophageal tube tip and side port terminate below the GE junction, beyond the margins of imaging. Endotracheal tube terminates in the mid trachea, 4.4 cm from the carina. Telemetry leads and external devices  overlie the chest. Some persistently low volumes and atelectatic changes are present with some diffuse mild airways thickening and patchy opacities again seen in the left lung base. Some increasing opacity in the right lung base could reflect developing airspace disease or atelectasis. No pneumothorax. No effusion. Stable cardiomediastinal contours. No acute osseous or soft tissue abnormality. IMPRESSION: 1. Persistently low volumes and atelectasis albeit with some increasingly conspicuous patchy basilar opacities particularly on the right. Could reflect developing or worsening airspace disease and/or edema. 2. Lines and tubes as above. Electronically Signed   By: Lovena Le M.D.   On: 01/26/2020 02:55   DG Chest Port 1 View  Result Date: 01/24/2020 CLINICAL DATA:  Intubation. EXAM: PORTABLE CHEST 1 VIEW COMPARISON:  01/24/2020. FINDINGS: Endotracheal tube noted with tip 3 cm above the carina. NG tube noted with tip below left hemidiaphragm. Left IJ line noted with tip at cavoatrial junction. Heart size normal. Low lung volumes. Mild left base interstitial infiltrate cannot be excluded. Bilateral calcified pulmonary nodules consistent prior granulomas disease. No pleural effusion or pneumothorax. IMPRESSION: 1. Lines and tubes in good anatomic position. 2. Low lung volumes. Mild left base interstitial infiltrate cannot be excluded. Electronically Signed   By: Marcello Moores  Register   On: 01/24/2020 15:05   DG Chest Port 1 View  Result Date: 01/24/2020 CLINICAL DATA:  Acute respiratory distress with shortness of breath. EXAM: PORTABLE CHEST 1 VIEW COMPARISON:  January 22, 2020. FINDINGS: Stable cardiomediastinal silhouette with mild central pulmonary vascular congestion. No pneumothorax or pleural effusion is noted. Stable calcified granulomata are noted bilaterally. No consolidative process is noted. The visualized skeletal structures are unremarkable. IMPRESSION: Mild central pulmonary vascular congestion.  Electronically Signed   By: Marijo Conception M.D.   On: 01/24/2020 09:03   DG Chest Portable 1 View  Result Date: 01/22/2020 CLINICAL DATA:  50 year old female with shortness of breath EXAM: PORTABLE CHEST 1 VIEW COMPARISON:  07/08/2010 FINDINGS: Cardiomediastinal silhouette unchanged in size and contour. Fullness in the central vasculature. Interlobular septal thickening with reticular opacities. No pneumothorax or large pleural effusion. No confluent airspace disease IMPRESSION: Acute CHF Electronically Signed   By: Corrie Mckusick D.O.   On: 01/22/2020 14:59   [place] EKG SITE RITE  Result Date: 01/24/2020 If Site Rite image not attached, placement could not be confirmed due to current cardiac rhythm.   Microbiology: Recent Results (from the past 240 hour(s))  SARS CORONAVIRUS 2 (TAT 6-24 HRS) Nasopharyngeal Nasopharyngeal Swab  Status: None   Collection Time: 02/10/20  6:00 PM   Specimen: Nasopharyngeal Swab  Result Value Ref Range Status   SARS Coronavirus 2 NEGATIVE NEGATIVE Final    Comment: (NOTE) SARS-CoV-2 target nucleic acids are NOT DETECTED.  The SARS-CoV-2 RNA is generally detectable in upper and lower respiratory specimens during the acute phase of infection. Negative results do not preclude SARS-CoV-2 infection, do not rule out co-infections with other pathogens, and should not be used as the sole basis for treatment or other patient management decisions. Negative results must be combined with clinical observations, patient history, and epidemiological information. The expected result is Negative.  Fact Sheet for Patients: SugarRoll.be  Fact Sheet for Healthcare Providers: https://www.woods-mathews.com/  This test is not yet approved or cleared by the [place] FDA and  has been authorized for detection and/or diagnosis of SARS-CoV-2 by FDA under an Emergency Use Authorization (EUA). This EUA will remain  in effect (meaning  this test can be used) for the duration of the COVID-19 declaration under Se ction 564(b)(1) of the Act, 21 U.S.C. section 360bbb-3(b)(1), unless the authorization is terminated or revoked sooner.  Performed at [facility], [place] [address], [place], [place] [address]      Labs: Basic Metabolic Panel: Recent Labs  Lab 02/07/20 0549 02/08/20 1638 02/09/20 0427 02/10/20 0822 02/11/20 0624  NA 144 139 141 142 139  K 3.5 3.6 3.4* 4.2 3.6  CL 93* 91* 95* 98 92*  CO2 40* 36* 34* 36* 33*  GLUCOSE 91 88 86 97 90  BUN 12 13 15 20  27*  CREATININE 0.36* 0.50 0.46 0.46 0.50  CALCIUM 9.1 9.2 9.2 9.2 9.5  MG 2.1 1.9 1.9 2.1 1.9  PHOS 5.2* 3.8 3.8 5.2* 5.6*   Liver Function Tests: Recent Labs  Lab 02/07/20 0549 02/08/20 0613 02/09/20 0427 02/10/20 0822 02/11/20 0624  ALBUMIN 3.0* 3.0* 3.2* 3.0* 3.1*   No results for input(s): LIPASE, AMYLASE in the last 168 hours. No results for input(s): AMMONIA in the last 168 hours. CBC: Recent Labs  Lab 02/07/20 0549 02/08/20 0613 02/09/20 0427 02/10/20 0822 02/11/20 0624  WBC 8.1 7.0 7.5 5.0 4.8  NEUTROABS 5.5 4.6 4.8 3.1 2.5  HGB 12.3 13.3 12.7 11.9* 11.6*  HCT 41.6 43.3 39.6 39.6 36.7  MCV 101.0* 98.0 95.9 98.3 95.6  PLT 243 233 229 210 200   Cardiac Enzymes: No results for input(s): CKTOTAL, CKMB, CKMBINDEX, TROPONINI in the last 168 hours. BNP: BNP (last 3 results) Recent Labs    07/09/19 0306 01/22/20 1340  BNP 51.8 58.0    ProBNP (last 3 results) No results for input(s): PROBNP in the last 8760 hours.  CBG: Recent Labs  Lab 02/09/20 2201 02/10/20 0735 02/10/20 1152 02/10/20 1604 02/11/20 1201  GLUCAP 135* 77 89 103* 73       Signed:  Irine Seal MD.  [facility] 02/11/2020, 2:45 PM

## 2020-02-12 ENCOUNTER — Other Ambulatory Visit: Payer: Self-pay | Admitting: Internal Medicine

## 2020-02-12 ENCOUNTER — Other Ambulatory Visit: Payer: Self-pay | Admitting: Adult Health

## 2020-02-13 ENCOUNTER — Other Ambulatory Visit: Payer: Self-pay | Admitting: Adult Health

## 2020-02-15 ENCOUNTER — Other Ambulatory Visit: Payer: Self-pay | Admitting: Adult Health

## 2020-02-19 ENCOUNTER — Other Ambulatory Visit: Payer: Self-pay | Admitting: Adult Health

## 2020-02-19 ENCOUNTER — Other Ambulatory Visit: Payer: Self-pay | Admitting: Internal Medicine

## 2020-02-19 ENCOUNTER — Ambulatory Visit: Payer: 59 | Admitting: Family

## 2020-03-03 ENCOUNTER — Telehealth: Payer: Self-pay | Admitting: Family

## 2020-03-03 ENCOUNTER — Ambulatory Visit: Payer: 59 | Admitting: Family

## 2020-03-03 NOTE — Progress Notes (Deleted)
   Patient ID: Misty Green, female    DOB: 05-Sep-1970, 50 y.o.   MRN: [id number]  HPI  Misty Green is a 50 y/o female with a history of  Echo report from 04/13/19 reviewed and showed an EF of >50% along with trace MR/TR.   Admitted 01/22/20 due to acute on chronic hypoxic hypercapnic respiratory failure in the setting of acute COPD exacerbation acute on chronic diastolic heart failure. Initially placed on bipap with oxygen due to hypoxia. Covid negative. Given IV lasix, solumedrol and nebulizer treatment. Decompensated so needed to be intubated. ENT consult obtained. Successfully extubated and weaned oxygen back to her baseline of 3.5L. Weaned off steroids. Had ICU delirium which improved. IV lasix transitioned to oral diuretics. Discharged after 20 days.   She presents today for her initial visit with a chief complaint of  Review of Systems    Physical Exam    Assessment & Plan:  1: Chronic heart failure with preserved ejection fraction without structural changes- - NYHA class  2: COPD-  3: Tobacco use-

## 2020-03-03 NOTE — Telephone Encounter (Signed)
Patient did not show for her Heart Failure Clinic appointment on 03/03/20. Will attempt to reschedule.  

## 2020-03-11 ENCOUNTER — Other Ambulatory Visit: Payer: Self-pay | Admitting: Internal Medicine

## 2020-03-11 DIAGNOSIS — Z1231 Encounter for screening mammogram for malignant neoplasm of breast: Secondary | ICD-10-CM

## 2020-03-12 ENCOUNTER — Ambulatory Visit: Payer: 59 | Admitting: Family

## 2020-03-17 ENCOUNTER — Other Ambulatory Visit: Payer: Self-pay | Admitting: Internal Medicine

## 2020-03-17 ENCOUNTER — Other Ambulatory Visit: Payer: Self-pay | Admitting: Adult Health

## 2020-03-19 NOTE — Progress Notes (Deleted)
   Patient ID: Misty Green, female    DOB: 12/19/1970, 50 y.o.   MRN: [id number]  HPI  Ms Misty Green is a 50 y/o female with a history of  Echo report from 04/13/19 reviewed and showed an EF of >50% along with trace MR/TR.   Admitted 01/22/20 due to acute on chronic hypoxic hypercapnic respiratory failure in the setting of acute COPD exacerbation acute on chronic diastolic heart failure. Initially placed on bipap with oxygen due to hypoxia. Covid negative. Given IV lasix, solumedrol and nebulizer treatment. Decompensated so needed to be intubated. ENT consult obtained. Successfully extubated and weaned oxygen back to her baseline of 3.5L. Weaned off steroids. Had ICU delirium which improved. IV lasix transitioned to oral diuretics. Discharged after 20 days.   She presents today for her initial visit with a chief complaint of  Review of Systems    Physical Exam    Assessment & Plan:  1: Chronic heart failure with preserved ejection fraction without structural changes- - NYHA class - BNP 01/22/20 was 58.0  2: COPD- - saw pulmonology Misty Green) 02/21/20 - BMP 02/13/20 reviewed and showed sodium 138, potassium 3.6, creatinine 0.5 and GFR 131  3: Tobacco use-  4: Sleep apnea- - saw PCP Misty Green) 03/11/20

## 2020-03-20 ENCOUNTER — Telehealth: Payer: Self-pay | Admitting: Family

## 2020-03-20 ENCOUNTER — Ambulatory Visit: Payer: 59 | Admitting: Family

## 2020-03-20 NOTE — Telephone Encounter (Signed)
LVM in attempt to reschedule her no show CHF CLinic appointment that she confirmed yesterday she would be at.    Dasean Brow, NT

## 2020-03-20 NOTE — Telephone Encounter (Signed)
Patient did not show for her Heart Failure Clinic appointment on 03/20/20. Will attempt to reschedule.

## 2020-04-07 ENCOUNTER — Other Ambulatory Visit: Payer: Self-pay

## 2020-04-07 ENCOUNTER — Other Ambulatory Visit: Payer: Self-pay | Admitting: Internal Medicine

## 2020-04-07 ENCOUNTER — Ambulatory Visit
Admission: RE | Admit: 2020-04-07 | Discharge: 2020-04-07 | Disposition: A | Discharge status: Home / Self Care | Payer: 59 | Source: Ambulatory Visit | Attending: Internal Medicine | Admitting: Internal Medicine

## 2020-04-07 DIAGNOSIS — R1032 Left lower quadrant pain: Secondary | ICD-10-CM | POA: Diagnosis present

## 2020-04-07 DIAGNOSIS — R1010 Upper abdominal pain, unspecified: Secondary | ICD-10-CM | POA: Diagnosis present

## 2020-04-07 HISTORY — DX: Essential (primary) hypertension: I10

## 2020-04-07 LAB — POCT I-STAT CREATININE: Creatinine, Ser: 0.8 mg/dL (ref 0.44–1.00)

## 2020-04-07 MED ORDER — IOHEXOL 300 MG/ML  SOLN
100.0000 mL | Freq: Once | INTRAMUSCULAR | Status: AC | PRN
  Administered 2020-04-07: 100 mL via INTRAVENOUS

## 2020-05-02 ENCOUNTER — Encounter: Admission: RE | Payer: Self-pay | Source: Home / Self Care

## 2020-05-02 ENCOUNTER — Ambulatory Visit: Admission: RE | Admit: 2020-05-02 | Payer: 59 | Source: Home / Self Care

## 2020-05-02 SURGERY — COLONOSCOPY WITH PROPOFOL
Anesthesia: General

## 2020-07-08 ENCOUNTER — Encounter: Payer: Self-pay | Admitting: Internal Medicine

## 2020-07-09 ENCOUNTER — Encounter: Admission: RE | Payer: Self-pay | Source: Home / Self Care

## 2020-07-09 ENCOUNTER — Encounter: Payer: Self-pay | Admitting: Anesthesiology

## 2020-07-09 ENCOUNTER — Encounter: Payer: Self-pay | Admitting: Internal Medicine

## 2020-07-09 ENCOUNTER — Ambulatory Visit: Admission: RE | Admit: 2020-07-09 | Payer: 59 | Source: Home / Self Care | Admitting: Internal Medicine

## 2020-07-09 HISTORY — DX: Sleep apnea, unspecified: G47.30

## 2020-07-09 HISTORY — DX: Varicella without complication: B01.9

## 2020-07-09 HISTORY — DX: Hypoxemia: R09.02

## 2020-07-09 HISTORY — DX: Colostomy malfunction: K94.03

## 2020-07-09 SURGERY — COLONOSCOPY WITH PROPOFOL
Anesthesia: General

## 2020-10-05 ENCOUNTER — Emergency Department
Admission: EM | Admit: 2020-10-05 | Discharge: 2020-10-05 | Disposition: A | Discharge status: Home / Self Care | Payer: 59 | Attending: Emergency Medicine | Admitting: Emergency Medicine

## 2020-10-05 ENCOUNTER — Other Ambulatory Visit: Payer: Self-pay

## 2020-10-05 ENCOUNTER — Emergency Department: Payer: 59

## 2020-10-05 DIAGNOSIS — I5033 Acute on chronic diastolic (congestive) heart failure: Secondary | ICD-10-CM | POA: Diagnosis not present

## 2020-10-05 DIAGNOSIS — J189 Pneumonia, unspecified organism: Secondary | ICD-10-CM

## 2020-10-05 DIAGNOSIS — Z79899 Other long term (current) drug therapy: Secondary | ICD-10-CM | POA: Diagnosis not present

## 2020-10-05 DIAGNOSIS — Z933 Colostomy status: Secondary | ICD-10-CM | POA: Insufficient documentation

## 2020-10-05 DIAGNOSIS — J181 Lobar pneumonia, unspecified organism: Secondary | ICD-10-CM | POA: Diagnosis not present

## 2020-10-05 DIAGNOSIS — J441 Chronic obstructive pulmonary disease with (acute) exacerbation: Secondary | ICD-10-CM

## 2020-10-05 DIAGNOSIS — Z7951 Long term (current) use of inhaled steroids: Secondary | ICD-10-CM | POA: Insufficient documentation

## 2020-10-05 DIAGNOSIS — Z87891 Personal history of nicotine dependence: Secondary | ICD-10-CM | POA: Insufficient documentation

## 2020-10-05 DIAGNOSIS — I110 Hypertensive heart disease with heart failure: Secondary | ICD-10-CM | POA: Insufficient documentation

## 2020-10-05 DIAGNOSIS — R0602 Shortness of breath: Secondary | ICD-10-CM | POA: Diagnosis present

## 2020-10-05 LAB — COMPREHENSIVE METABOLIC PANEL
ALT: 12 U/L (ref 0–44)
AST: 20 U/L (ref 15–41)
Albumin: 4.1 g/dL (ref 3.5–5.0)
Alkaline Phosphatase: 61 U/L (ref 38–126)
Anion gap: 10 (ref 5–15)
BUN: 11 mg/dL (ref 6–20)
CO2: 37 mmol/L — ABNORMAL HIGH (ref 22–32)
Calcium: 8.9 mg/dL (ref 8.9–10.3)
Chloride: 94 mmol/L — ABNORMAL LOW (ref 98–111)
Creatinine, Ser: 0.65 mg/dL (ref 0.44–1.00)
GFR, Estimated: >60 mL/min (ref >60)
Glucose, Bld: 114 mg/dL — ABNORMAL HIGH (ref 70–99)
Potassium: 3.6 mmol/L (ref 3.5–5.1)
Sodium: 141 mmol/L (ref 135–145)
Total Bilirubin: 0.8 mg/dL (ref 0.3–1.2)
Total Protein: 8.1 g/dL (ref 6.5–8.1)

## 2020-10-05 LAB — BRAIN NATRIURETIC PEPTIDE: B Natriuretic Peptide: 10.8 pg/mL (ref 0.0–100.0)

## 2020-10-05 LAB — CBC WITH DIFFERENTIAL/PLATELET
Abs Immature Granulocytes: 0.02 10*3/uL (ref 0.00–0.07)
Basophils Absolute: 0.1 10*3/uL (ref 0.0–0.1)
Basophils Relative: 1 %
Eosinophils Absolute: 0.1 10*3/uL (ref 0.0–0.5)
Eosinophils Relative: 1 %
HCT: 42 % (ref 36.0–46.0)
Hemoglobin: 13.6 g/dL (ref 12.0–15.0)
Immature Granulocytes: 0 %
Lymphocytes Relative: 31 %
Lymphs Abs: 2.6 10*3/uL (ref 0.7–4.0)
MCH: 30.6 pg (ref 26.0–34.0)
MCHC: 32.4 g/dL (ref 30.0–36.0)
MCV: 94.4 fL (ref 80.0–100.0)
Monocytes Absolute: 0.6 10*3/uL (ref 0.1–1.0)
Monocytes Relative: 7 %
Neutro Abs: 4.9 10*3/uL (ref 1.7–7.7)
Neutrophils Relative %: 60 %
Platelets: 298 10*3/uL (ref 150–400)
RBC: 4.45 MIL/uL (ref 3.87–5.11)
RDW: 12.5 % (ref 11.5–15.5)
WBC: 8.3 10*3/uL (ref 4.0–10.5)
nRBC: 0 % (ref 0.0–0.2)

## 2020-10-05 MED ORDER — METHYLPREDNISOLONE SODIUM SUCC 125 MG IJ SOLR
125.0000 mg | Freq: Once | INTRAMUSCULAR | Status: AC
  Administered 2020-10-05: 125 mg via INTRAVENOUS

## 2020-10-05 MED ORDER — MORPHINE SULFATE (PF) 4 MG/ML IV SOLN
4.0000 mg | Freq: Once | INTRAVENOUS | Status: AC
  Administered 2020-10-05: 4 mg via INTRAVENOUS
  Filled 2020-10-05: qty 1

## 2020-10-05 MED ORDER — IOHEXOL 350 MG/ML SOLN
75.0000 mL | Freq: Once | INTRAVENOUS | Status: AC | PRN
  Administered 2020-10-05: 75 mL via INTRAVENOUS

## 2020-10-05 MED ORDER — DOXYCYCLINE HYCLATE 100 MG PO TABS: 100.0000 mg | ORAL_TABLET | Freq: Two times a day (BID) | ORAL | 0 refills | Status: AC

## 2020-10-05 MED ORDER — IOHEXOL 9 MG/ML PO SOLN
1000.0000 mL | Freq: Once | ORAL | Status: DC | PRN
  Administered 2020-10-05: 1000 mL via ORAL

## 2020-10-05 MED ORDER — METHYLPREDNISOLONE SODIUM SUCC 125 MG IJ SOLR
125.0000 mg | Freq: Once | INTRAMUSCULAR | Status: DC
  Filled 2020-10-05: qty 2

## 2020-10-05 MED ORDER — ONDANSETRON HCL 4 MG/2ML IJ SOLN
4.0000 mg | Freq: Once | INTRAMUSCULAR | Status: AC
  Administered 2020-10-05: 4 mg via INTRAVENOUS
  Filled 2020-10-05: qty 2

## 2020-10-05 MED ORDER — IPRATROPIUM-ALBUTEROL 0.5-2.5 (3) MG/3ML IN SOLN
6.0000 mL | Freq: Once | RESPIRATORY_TRACT | Status: AC
  Administered 2020-10-05: 6 mL via RESPIRATORY_TRACT
  Filled 2020-10-05: qty 6

## 2020-10-05 MED ORDER — IPRATROPIUM-ALBUTEROL 0.5-2.5 (3) MG/3ML IN SOLN
3.0000 mL | Freq: Once | RESPIRATORY_TRACT | Status: AC
  Administered 2020-10-05: 3 mL via RESPIRATORY_TRACT
  Filled 2020-10-05: qty 3

## 2020-10-05 MED ORDER — BUTALBITAL-APAP-CAFFEINE 50-325-40 MG PO TABS
2.0000 | ORAL_TABLET | Freq: Once | ORAL | Status: AC
  Administered 2020-10-05: 2 via ORAL
  Filled 2020-10-05: qty 2

## 2020-10-05 MED ORDER — PREDNISONE 10 MG (21) PO TBPK: ORAL_TABLET | ORAL | 0 refills | Status: AC

## 2020-10-05 MED ORDER — KETOROLAC TROMETHAMINE 30 MG/ML IJ SOLN
15.0000 mg | Freq: Once | INTRAMUSCULAR | Status: AC
  Administered 2020-10-05: 15 mg via INTRAVENOUS
  Filled 2020-10-05: qty 1

## 2020-10-05 MED ORDER — DOXYCYCLINE HYCLATE 100 MG PO TABS
100.0000 mg | ORAL_TABLET | Freq: Once | ORAL | Status: AC
  Administered 2020-10-05: 100 mg via ORAL
  Filled 2020-10-05: qty 1

## 2020-10-05 NOTE — Discharge Instructions (Signed)
Use Tylenol for pain and fevers.  Up to 1000 mg per dose, up to 4 times per day.  Do not take more than 4000 mg of Tylenol/acetaminophen within 24 hours..  

## 2020-10-05 NOTE — ED Notes (Signed)
CT informed pt finished contrast

## 2020-10-05 NOTE — ED Triage Notes (Signed)
Pt states she has been sick the last 4 days at home with vomiting- pt states she has a hx of COPD and got exteremly [facility]- pt normally on 3L [place] but EMS had to put her on 6L

## 2020-10-05 NOTE — ED Provider Notes (Signed)
[facility] Emergency Department Provider Note ____________________________________________   Event Date/Time   First MD Initiated Contact with Patient 10/05/20 1705     (approximate)  I have reviewed the triage vital signs and the nursing notes.  HISTORY  Chief Complaint Shortness of Breath and Emesis   HPI Misty Green is a 50 y.o. femalewho presents to the ED for evaluation of shortness of breath.  Chart review indicates obese patient with history of COPD and CHF with 3 L home oxygen at baseline.  S/p Hartman's procedure with an end colostomy in place.  Patient presents to the ED for evaluation of 3-4 days of generalized weakness, worsening shortness of breath, increased productive cough, poor appetite with nausea and emesis.  She reports lesser output through her ostomy bag.  Reports no urinary changes, such as dysuria, frequency or poor output.  Denies documented fevers.  Reports some dizziness and weakness with standing without syncopal episodes, falls or trauma.  Denies any abdominal pain independently, but has some severe pain with palpation and says she only hurts after I touched.  Further reporting an aching headache 8/10 intensity.   Past Medical History:  Diagnosis Date   Cardiac arrest ([place]) 05/12/2018   CHF (congestive heart failure) (HCC)    Chicken pox    COPD (chronic obstructive pulmonary disease) (HCC)    Current smoker    Dyspnea    GERD (gastroesophageal reflux disease)    Hypertension    Malfunction of colostomy and enterostomy (HCC)    Opiate abuse, continuous ([place])    Oxygen deficit    Sleep apnea     Patient Active Problem List   Diagnosis Date Noted   Acute on chronic respiratory failure with hypoxia and hypercapnia ([place]) 01/22/2020   Acute on chronic diastolic CHF (congestive heart failure) ([place]) 01/22/2020   Respiratory failure with hypercapnia ([place]) 07/08/2019   Chronic fatigue 05/16/2019   SOB (shortness of  breath) 05/16/2019   Seasonal allergic reaction 05/16/2019   Anxiety disorder due to multiple medical problems 05/16/2019   Edema 05/16/2019   Encounter for screening mammogram for malignant neoplasm of breast 05/16/2019   OSA (obstructive sleep apnea)    Noncompliance with CPAP treatment    Chronic obstructive pulmonary disease ([place])    Chest wall pain    Essential hypertension    Steroid-induced hyperglycemia    Anemia of chronic disease    AKI (acute kidney injury) ([place])    Supplemental oxygen dependent    Generalized anxiety disorder    Hypoxic encephalopathy ([place]) 05/27/2018   Anoxic brain injury ([place])    ARF (acute renal failure) (HCC)    Lactic acidosis    Transaminitis    Acute on chronic respiratory failure with hypercapnia ([place])    Cardiac arrest ([place]) 05/12/2018   Diverticulitis of colon (without mention of hemorrhage)(562.11)    Acute diverticulitis 01/06/2018   Mixed anxiety and depressive disorder 12/22/2017   Obesity 12/22/2017   Tobacco user 12/22/2017   Diverticulitis 12/03/2017   Spinal stenosis of lumbar region 04/12/2016   Acute respiratory failure ([place]) 03/22/2016   COPD exacerbation ([place]) 03/22/2016   Chronic back pain 03/22/2016   Opioid type dependence, abuse ([place]) 03/22/2016   Polycythemia 03/22/2016   Anxiety 03/22/2016   OSA on CPAP 03/22/2016   Closed fracture of distal end of right radius 08/25/2015   Closed nondisplaced fracture of styloid process of right ulna 08/25/2015    Past Surgical History:  Procedure Laterality Date  ABDOMINAL HYSTERECTOMY     BACK SURGERY     cesction     COLOSTOMY  01/13/2018   Procedure: COLOSTOMY Creation;  Surgeon: Jules Husbands, MD;  Location: [facility] ORS;  Service: General;;   HERNIA REPAIR     INCISIONAL HERNIA REPAIR     lower midline laparotomy incision (hysterectomy), repaired with mesh   IR FLUORO GUIDE CV LINE RIGHT  05/26/2018   IR REMOVAL TUN CV CATH W/O FL  06/02/2018   IR US GUIDE VASC ACCESS  RIGHT  05/26/2018   LAPAROSCOPIC CHOLECYSTECTOMY  2012   LAPAROSCOPIC LYSIS OF ADHESIONS  01/13/2018   Procedure: LAPAROSCOPIC LYSIS OF ADHESIONS;  Surgeon: Jules Husbands, MD;  Location: [facility] ORS;  Service: General;;   LAPAROSCOPIC SIGMOID COLECTOMY  01/13/2018   Procedure: LAPAROSCOPIC SIGMOID COLECTOMY;  Surgeon: Jules Husbands, MD;  Location: [facility] ORS;  Service: General;;   LUMBAR SPINE SURGERY     ORIF WRIST FRACTURE Right 08/25/2015   Procedure: OPEN REDUCTION INTERNAL FIXATION (ORIF) WRIST FRACTURE;  Surgeon: Corky Mull, MD;  Location: [facility] ORS;  Service: Orthopedics;  Laterality: Right;   OVARIAN CYST REMOVAL      Prior to Admission medications   Medication Sig Start Date End Date Taking? Authorizing Provider  doxycycline (VIBRA-TABS) 100 MG tablet Take 1 tablet (100 mg total) by mouth 2 (two) times daily for 7 days. 10/05/20 10/12/20 Yes Vladimir Crofts, MD  predniSONE (STERAPRED UNI-PAK 21 TAB) 10 MG (21) TBPK tablet Take 4 tablets (40 mg total) by mouth daily for 4 days, THEN 3 tablets (30 mg total) daily for 2 days, THEN 2 tablets (20 mg total) daily for 2 days, THEN 1 tablet (10 mg total) daily for 2 days. 10/05/20 10/15/20 Yes Vladimir Crofts, MD  acetaminophen (TYLENOL) 325 MG tablet Take 1-2 tablets (325-650 mg total) by mouth every 4 (four) hours as needed for mild pain. Patient taking differently: Take 650 mg by mouth 3 (three) times daily. 06/05/18   Love, Ivan Anchors, PA-C  albuterol (PROVENTIL HFA;VENTOLIN HFA) 108 (90 Base) MCG/ACT inhaler Inhale 2 puffs into the lungs every 6 (six) hours as needed for wheezing or shortness of breath.     [provider]  albuterol (PROVENTIL) (2.5 MG/3ML) 0.083% nebulizer solution Take 2.5 mg by nebulization every 6 (six) hours as needed for wheezing or shortness of breath. Inhale one vial via nebulizer 4 times a day as directed.    [provider]  amLODipine (NORVASC) 10 MG tablet Take 10 mg by mouth daily.    [provider]  budesonide (PULMICORT) 0.5 MG/2ML nebulizer solution Take 2 mLs (0.5 mg total) by nebulization 2 (two) times daily. 02/11/20   Eugenie Filler, MD  buPROPion (WELLBUTRIN) 75 MG tablet Take 75 mg by mouth 2 (two) times daily.    [provider]  DALIRESP 500 MCG TABS tablet TAKE 1 TABLET BY MOUTH DAILY Patient taking differently: Take 500 mcg by mouth daily. 08/27/19   Lavera Guise, MD  escitalopram (LEXAPRO) 20 MG tablet TAKE 1 TABLET BY MOUTH EVERY DAY Patient taking differently: Take 20 mg by mouth daily. 07/20/19   Ronnell Freshwater, NP  feeding supplement (ENSURE ENLIVE / ENSURE PLUS) LIQD Take 237 mLs by mouth 3 (three) times daily between meals. 02/11/20   Eugenie Filler, MD  fluticasone (FLONASE) 50 MCG/ACT nasal spray Place 1 spray into both nostrils at bedtime. 02/11/20   Eugenie Filler, MD  Fluticasone-Umeclidin-Vilant (TRELEGY ELLIPTA) 100-62.5-25  MCG/INH AEPB Inhale 1 each into the lungs daily. 07/19/19   Kendell Bane, NP  furosemide (LASIX) 40 MG tablet Take 40 mg by mouth daily. 01/20/20   [provider]  gabapentin (NEURONTIN) 400 MG capsule Take 1 capsule (400 mg total) by mouth 3 (three) times daily. 07/12/19   Mercy Riding, MD  guaiFENesin-codeine 100-10 MG/5ML syrup Take 10 mLs by mouth every 6 (six) hours as needed for cough. 02/11/20   Eugenie Filler, MD  hydrOXYzine (ATARAX/VISTARIL) 25 MG tablet Take 1 tablet (25 mg total) by mouth 3 (three) times daily. 02/11/20   Eugenie Filler, MD  Hyoscyamine Sulfate SL (LEVSIN/SL) 0.125 MG SUBL Place under the tongue every 6 (six) hours as needed.    [provider]  ipratropium-albuterol (DUONEB) 0.5-2.5 (3) MG/3ML SOLN Take 3 mLs by nebulization 3 (three) times daily. 02/11/20   Eugenie Filler, MD  Melatonin 10 MG TABS Take 30 mg by mouth at bedtime.    [provider]  montelukast (SINGULAIR) 10 MG tablet TAKE 1 TABLET BY MOUTH EVERYDAY AT BEDTIME Patient taking differently: Take  10 mg by mouth at bedtime. 07/30/19   Kendell Bane, NP  Multiple Vitamins-Minerals (MULTIVITAMIN WITH MINERALS) tablet Take 1 tablet by mouth at bedtime.     [provider]  nicotine (NICODERM CQ - DOSED IN MG/24 HOURS) 14 mg/24hr patch Place 1 patch (14 mg total) onto the skin daily as needed (for smoking cessation). 02/11/20   Eugenie Filler, MD  ondansetron (ZOFRAN) 4 MG tablet Take 4 mg by mouth every 8 (eight) hours as needed for nausea or vomiting.    [provider]  OZEMPIC, 0.25 OR 0.5 MG/DOSE, 2 MG/1.5ML SOPN Inject 0.25 mg into the skin once a week. 10/02/19   [provider]  pantoprazole (PROTONIX) 40 MG tablet Take 40 mg by mouth 2 (two) times daily. 11/11/19   [provider]  polyethylene glycol (MIRALAX / GLYCOLAX) 17 g packet Take 17 g by mouth daily. 02/12/20   Eugenie Filler, MD  PRESCRIPTION MEDICATION Inhale into the lungs at bedtime. BiPap    [provider]  promethazine (PHENERGAN) 25 MG tablet Take 25 mg by mouth every 8 (eight) hours as needed for nausea or vomiting.    [provider]  QUEtiapine (SEROQUEL) 25 MG tablet Take 1 tablet (25 mg total) by mouth at bedtime. 02/11/20   Eugenie Filler, MD  senna-docusate (SENOKOT-S) 8.6-50 MG tablet Take 1 tablet by mouth at bedtime as needed for mild constipation. 02/11/20   Eugenie Filler, MD  traMADol (ULTRAM) 50 MG tablet Take 2 tablets (100 mg total) by mouth every 6 (six) hours as needed. 02/11/20 02/10/21  Eugenie Filler, MD  Vitamin D, Ergocalciferol, (DRISDOL) 1.25 MG (50000 UNIT) CAPS capsule Take 50,000 Units by mouth once a week. 11/13/19   [provider]    Allergies Other  Family History  Problem Relation Age of Onset   Diabetes Mother    COPD Mother    Hypertension Father    Diabetes Father    Heart disease Father    Hyperlipidemia Father    Prostate cancer Father     Social History Social History   Tobacco Use    Smoking status: Former    Packs/day: 1.00    Years: 15.00    Pack years: 15.00    Types: Cigarettes   Smokeless tobacco: Never   Tobacco comments:    4  cigarettes a day   Vaping Use   Vaping Use: Former  Substance Use Topics   Alcohol use: Yes    Comment: rarely   Drug use: Yes    Types: Marijuana    Comment: not everyday     Review of Systems  Constitutional: No fever/chills.  Positive generalized weakness Eyes: No visual changes. ENT: No sore throat. Cardiovascular: Denies chest pain. Respiratory: Positive for shortness of breath and productive cough. Gastrointestinal: Positive for nausea, emesis and poor ostomy output. Genitourinary: Negative for dysuria. Musculoskeletal: Negative for back pain. Skin: Negative for rash. Neurological: Negative for focal weakness or numbness. Positive for headache ____________________________________________   PHYSICAL EXAM:  VITAL SIGNS: Vitals:   10/05/20 1800 10/05/20 1830  BP: 135/84 (!) 124/106  Pulse: 67 (!) 58  Resp: 16 11  Temp:    SpO2: 96% 100%     Constitutional: Alert and oriented.  Appears uncomfortable without distress.  Obviously working little bit to breathe.  Obese and sitting up in bed Eyes: Conjunctivae are normal. PERRL. EOMI. Head: Atraumatic. Nose: No congestion/rhinnorhea. Mouth/Throat: Mucous membranes are moist.  Oropharynx non-erythematous. Neck: No stridor. No cervical spine tenderness to palpation. Cardiovascular: Normal rate, regular rhythm. Grossly normal heart sounds.  Good peripheral circulation. Respiratory: Dyspneic and tachypneic to the mid 20s.  Diffuse expiratory wheezes are present.  Poor air movement throughout. Gastrointestinal: Soft , nondistended. No CVA tenderness. Ostomy to the left side and abdomen with some dark liquid stool present in the bag.,  With some voluntary guarding around her stoma, but no peritoneal features. Musculoskeletal: No lower extremity tenderness nor edema.  No  joint effusions. No signs of acute trauma. Neurologic:  Normal speech and language. No gross focal neurologic deficits are appreciated.  Skin:  Skin is warm, dry and intact. No rash noted. Psychiatric: Mood and affect are normal. Speech and behavior are normal.  ____________________________________________   LABS (all labs ordered are listed, but only abnormal results are displayed)  Labs Reviewed  COMPREHENSIVE METABOLIC PANEL - Abnormal; Notable for the following components:      Result Value   Chloride 94 (*)    CO2 37 (*)    Glucose, Bld 114 (*)    All other components within normal limits  CBC WITH DIFFERENTIAL/PLATELET  BRAIN NATRIURETIC PEPTIDE   ____________________________________________  12 Lead EKG  Sinus rhythm with a rate of 83 bpm.  Normal axis and intervals.  No evidence of acute ischemia. ____________________________________________  RADIOLOGY  ED MD interpretation:  2 view CXR reviewed by me with left basilar infiltrate  CT abdomen/pelvis reviewed by me withNo evidence of SBO  Official radiology report(s): DG Chest 2 View  Result Date: 10/05/2020 CLINICAL DATA:  Shortness of breath. EXAM: CHEST - 2 VIEW COMPARISON:  February 04, 2020 FINDINGS: Numerous calcified granulomas in the lungs. Infiltrate in left base. No other interval changes. IMPRESSION: Left basilar infiltrate on the frontal view worrisome for developing pneumonia. Recommend short-term follow-up imaging to ensure resolution. Electronically Signed   By: Dorise Bullion III M.D.   On: 10/05/2020 14:02   CT ABDOMEN PELVIS W CONTRAST  Result Date: 10/05/2020 CLINICAL DATA:  end colostomy w Hartmann's procedure. Eval SBO. pain, emesis, poor output. Looks like LLL Pneumonia EXAM: CT ABDOMEN AND PELVIS WITH CONTRAST TECHNIQUE: Multidetector CT imaging of the abdomen and pelvis was performed using the standard protocol following bolus administration of intravenous contrast. CONTRAST:  61mL OMNIPAQUE  IOHEXOL 350 MG/ML SOLN COMPARISON:  Chest x-ray 10/05/2020, CT abdomen pelvis  12/29/2017, CT abdomen pelvis 06/04/2010 FINDINGS: Lower chest: No acute abnormality. Hepatobiliary: No focal liver abnormality. Status post cholecystectomy. No biliary dilatation. Pancreas: No focal lesion. Normal pancreatic contour. No surrounding inflammatory changes. No main pancreatic ductal dilatation. Spleen: Normal in size without focal abnormality. Adrenals/Urinary Tract: No adrenal nodule bilaterally. Bilateral kidneys enhance symmetrically. Subcentimeter hypodensities are too small to characterize. A stable in size 1.2 cm hypodensity within the right kidney demonstrates a density of 31 Hounsfield units. Punctate nonobstructive calcification within left kidney (6:36). No hydronephrosis. No hydroureter. The urinary bladder is unremarkable. Stomach/Bowel: Surgical changes related to a last lower quadrant end colostomy, Hartmann pouch formation. Stomach is within normal limits. No evidence of bowel wall thickening or dilatation. Diffuse colonic diverticulosis. No pneumatosis. Appendix appears normal. Vascular/Lymphatic: No abdominal aorta or iliac aneurysm. Mild atherosclerotic plaque of the aorta and its branches. No abdominal, pelvic, or inguinal lymphadenopathy. Reproductive: Uterus and bilateral adnexa are unremarkable. Other: No intraperitoneal free fluid. No intraperitoneal free gas. No organized fluid collection. Musculoskeletal: Parastomal hernia is noted containing a loop of small bowel. Healed anterior abdominal incision. Diastasis rectus. No suspicious lytic or blastic osseous lesions. No acute displaced fracture. Interbody Interbody surgical hardware at the L4-L5 and L5-S1 levels. Associated severe degenerative changes at these levels. IMPRESSION: 1. Similar-appearing left end colostomy with associated small to moderate volume parastomal hernia containing a loop of small bowel. No findings to suggest associated ischemia  or obstruction. 2. Stable in size indeterminate 1.2 cm right renal lesion. Consider MRI renal protocol for further evaluation. 3. Nonobstructive punctate left nephrolithiasis. 4. Scattered colonic diverticulosis with no acute diverticulitis. 5.  Aortic Atherosclerosis (ICD10-I70.0). Electronically Signed   By: Iven Finn M.D.   On: 10/05/2020 19:24    ____________________________________________   PROCEDURES and INTERVENTIONS  Procedure(s) performed (including Critical Care):  .1-3 Lead EKG Interpretation Performed by: Vladimir Crofts, MD Authorized by: Vladimir Crofts, MD     Interpretation: normal     ECG rate:  70   ECG rate assessment: normal     Rhythm: sinus rhythm     Ectopy: none     Conduction: normal    Medications  iohexol (OMNIPAQUE) 9 MG/ML oral solution 1,000 mL (1,000 mLs Oral Contrast Given 10/05/20 1837)  doxycycline (VIBRA-TABS) tablet 100 mg (100 mg Oral Given 10/05/20 1732)  methylPREDNISolone sodium succinate (SOLU-MEDROL) 125 mg/2 mL injection 125 mg (125 mg Intravenous Given 10/05/20 1732)  ipratropium-albuterol (DUONEB) 0.5-2.5 (3) MG/3ML nebulizer solution 6 mL (6 mLs Nebulization Given 10/05/20 1732)  morphine 4 MG/ML injection 4 mg (4 mg Intravenous Given 10/05/20 1732)  ondansetron (ZOFRAN) injection 4 mg (4 mg Intravenous Given 10/05/20 1731)  iohexol (OMNIPAQUE) 350 MG/ML injection 75 mL (75 mLs Intravenous Contrast Given 10/05/20 1836)  ipratropium-albuterol (DUONEB) 0.5-2.5 (3) MG/3ML nebulizer solution 3 mL (3 mLs Nebulization Given 10/05/20 2027)  butalbital-acetaminophen-caffeine (FIORICET) 50-325-40 MG per tablet 2 tablet (2 tablets Oral Given 10/05/20 2026)  ketorolac (TORADOL) 30 MG/ML injection 15 mg (15 mg Intravenous Given 10/05/20 2026)    ____________________________________________   MDM / ED COURSE   50 year old female presents to the ED with worsening shortness of breath, with evidence of COPD exacerbation and left basilar infiltrate concerning  for pneumonia, ultimately amenable to trial of outpatient management.  No evidence of sepsis or instability.  CXR without PTX, but does show a left basilar infiltrate concerning for pneumonia.  Furthermore she does have increased sputum production from her baseline.  Provided breathing treatments, steroids and doxycycline with improvement of  her symptoms.  CT without evidence of obstruction and shows known parastomal hernia without evidence of incarceration or SBO.  She is quite eager to go home, and I think it is reasonable to attempt a trial of outpatient management.  We discussed return precautions.  Clinical Course as of 10/05/20 2104  Nancy Fetter Oct 05, 2020  1718 Discussed plan of care with patient and her husband.  We discussed management of COPD exacerbation and pneumonia.  We discussed some uncertainty considering intra-abdominal tenderness and emesis, with the possibility of SBO or other pathology that would require admission.  They are in agreement. [DS]  2007 Reassessed.  Doing better.  Patient reports that she is very motivated to go home.  We discussed another breathing treatment and reassessment for considerations of discharge. [DS]  2054 Reassessed.  Continues to improve.  Nearly resolved wheezing with much improved airflow.  Patient is again requesting discharge.  I think this is reasonable.  We discussed return precautions and management at home. [DS]    Clinical Course User Index [DS] Vladimir Crofts, MD    ____________________________________________   FINAL CLINICAL IMPRESSION(S) / ED DIAGNOSES  Final diagnoses:  COPD exacerbation ([place])  Community acquired pneumonia of left lower lobe of lung     ED Discharge Orders          Ordered    doxycycline (VIBRA-TABS) 100 MG tablet  2 times daily        10/05/20 2058    predniSONE (STERAPRED UNI-PAK 21 TAB) 10 MG (21) TBPK tablet        10/05/20 2058             Kiandre Spagnolo   Note:  This document was prepared using Dragon  voice recognition software and may include unintentional dictation errors.    Vladimir Crofts, MD 10/05/20 2106

## 2020-10-05 NOTE — ED Triage Notes (Signed)
First nurse note: pt comes ems from home with sob for 4 days. Has hx of copd. Also c/o vomiting. Diminished lung sounds all over per ems. 7.5mg  albuterol given en route. 125mg  solumedrol. 91% 3L [place]. 6L [place] to 95%.

## 2020-10-08 ENCOUNTER — Ambulatory Visit: Admission: RE | Admit: 2020-10-08 | Payer: 59 | Source: Home / Self Care | Admitting: Internal Medicine

## 2020-10-08 ENCOUNTER — Encounter: Admission: RE | Payer: Self-pay | Source: Home / Self Care

## 2020-10-08 SURGERY — COLONOSCOPY WITH PROPOFOL
Anesthesia: General

## 2020-11-04 ENCOUNTER — Encounter (HOSPITAL_COMMUNITY): Payer: Self-pay | Admitting: Radiology

## 2020-11-25 IMAGING — MR MRI HEAD WITHOUT CONTRAST
12 of 13 series · 44 of 48 positions shown · non-contrast
Comparison: CT head 05/13/2018.

CLINICAL DATA: Anoxic brain damage? Patient suffered cardiac
arrest.

EXAM:
MRI HEAD WITHOUT CONTRAST
TECHNIQUE: Multiplanar, multiecho pulse sequences of the brain and surrounding
structures were obtained without intravenous contrast.

[Series 5: DWI · axial · 3.0mm · 0.88mm/px · z∈[-81,+71]mm · 9 of 104 slices shown (1 of 4)]
[im 1/104]
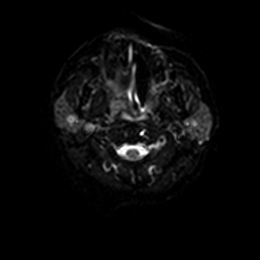
[im 13/104]
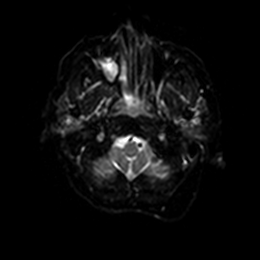
[im 26/104]
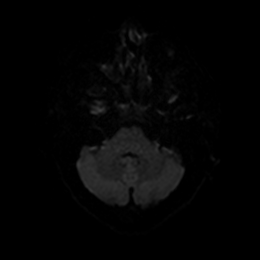
[im 39/104]
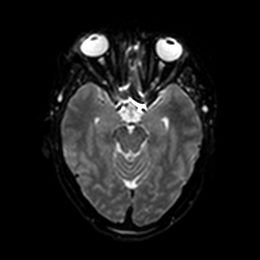
[im 52/104]
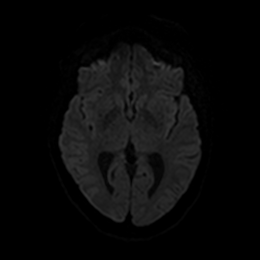
[im 65/104]
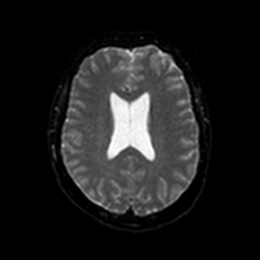
[im 78/104]
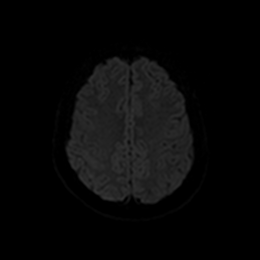
[im 91/104]
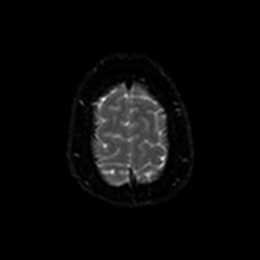
[im 104/104]
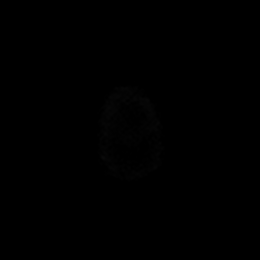

[Series 6: DWI · axial · 3.0mm · 0.88mm/px · z∈[-81,+71]mm · 4 of 52 slices shown (2 of 4)]
[im 1/52]
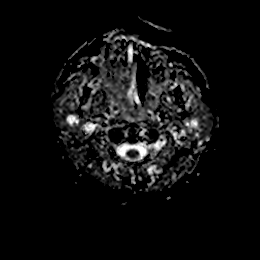
[im 18/52]
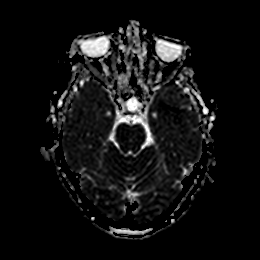
[im 35/52]
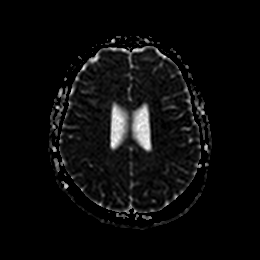
[im 52/52]
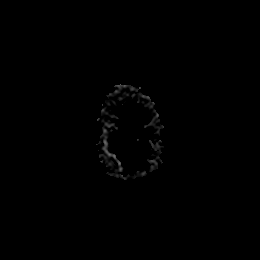

[Series 7: DWI · coronal · 4.0mm · 0.88mm/px · 5 of 72 slices shown (3 of 4)]
[im 1/72]
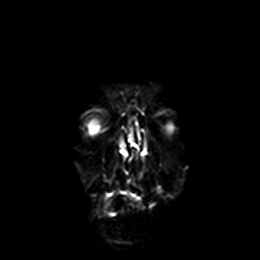
[im 18/72]
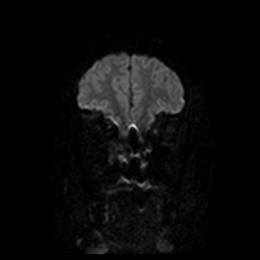
[im 36/72]
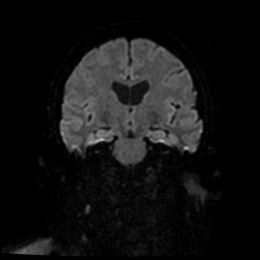
[im 54/72]
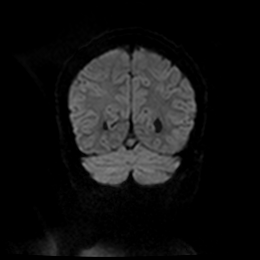
[im 72/72]
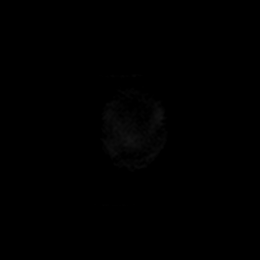

[Series 8: DWI · coronal · 4.0mm · 0.88mm/px · 3 of 36 slices shown (4 of 4)]
[im 1/36]
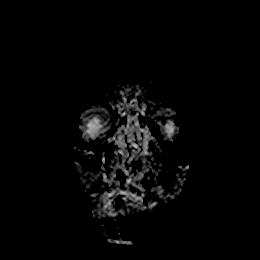
[im 18/36]
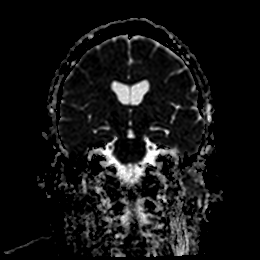
[im 36/36]
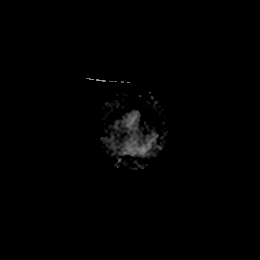

[Series 9: T1 · sagittal · 5.0mm · 0.75mm/px · 1 of 20 slices shown]
[im 1/20]
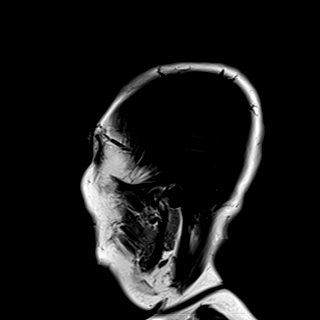

[Series 10: mag_images · axial · 3.0mm · 0.90mm/px · z∈[-96,+81]mm · 4 of 60 slices shown]
[im 1/60]
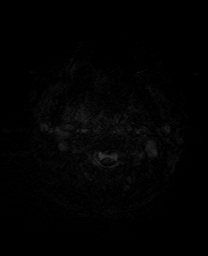
[im 20/60]
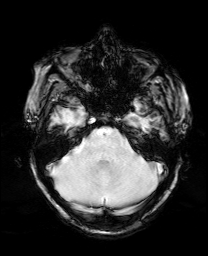
[im 40/60]
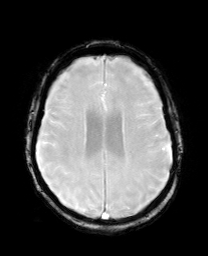
[im 60/60]
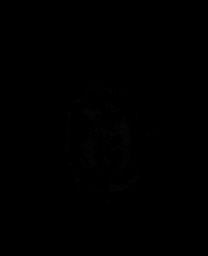

[Series 11: pha_images · axial · 3.0mm · 0.90mm/px · z∈[-96,+81]mm · 4 of 60 slices shown]
[im 1/60]
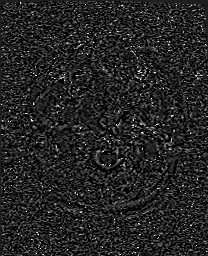
[im 20/60]
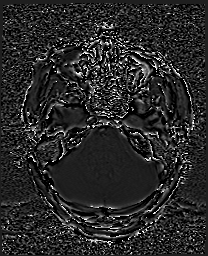
[im 40/60]
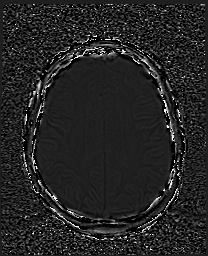
[im 60/60]
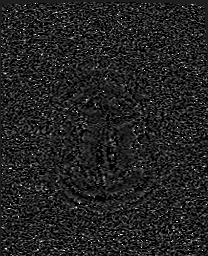

[Series 12: swi_images · axial · 3.0mm · 0.90mm/px · z∈[-96,+81]mm · 4 of 60 slices shown]
[im 1/60]
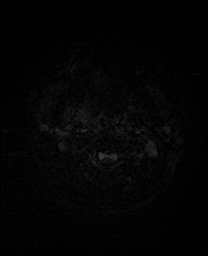
[im 20/60]
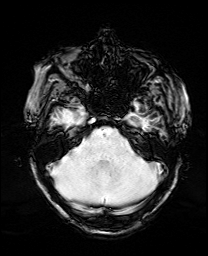
[im 40/60]
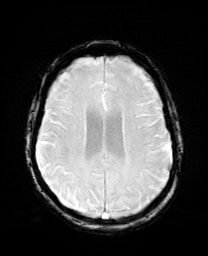
[im 60/60]
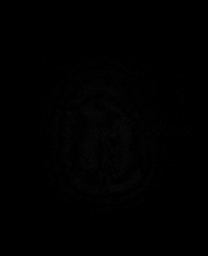

[Series 13: mip_images(sw) · axial · 24.0mm · 0.90mm/px · z∈[-86,+70]mm · 4 of 53 slices shown]
[im 1/53]
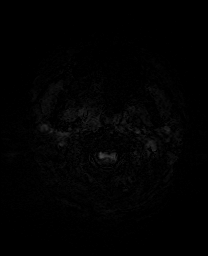
[im 18/53]
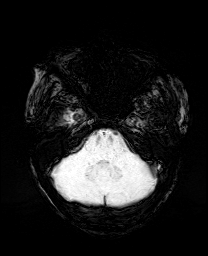
[im 35/53]
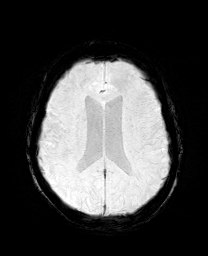
[im 53/53]
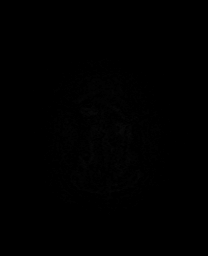

[Series 14: T2 · axial · 5.0mm · 0.72mm/px · z∈[-83,+61]mm · 2 of 25 slices shown (1 of 2)]
[im 1/25]
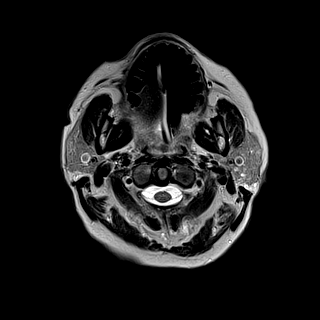
[im 25/25]
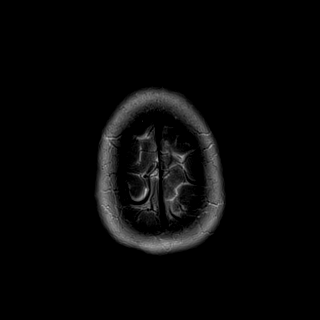

[Series 15: FLAIR · axial · 5.0mm · 0.45mm/px · z∈[-83,+61]mm · 2 of 25 slices shown]
[im 1/25]
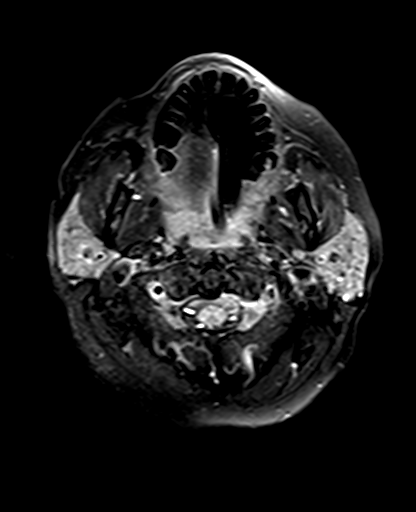
[im 25/25]
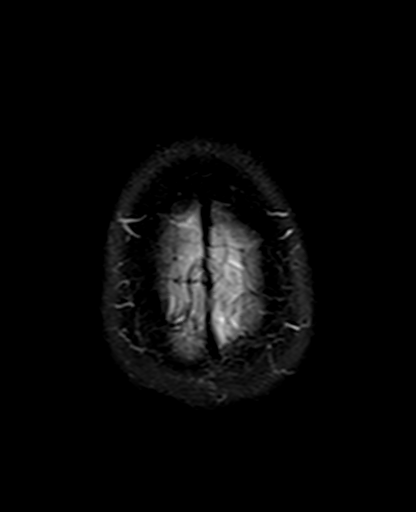

[Series 17: T2 · coronal · 5.0mm · 0.34mm/px · 2 of 29 slices shown (2 of 2)]
[im 1/29]
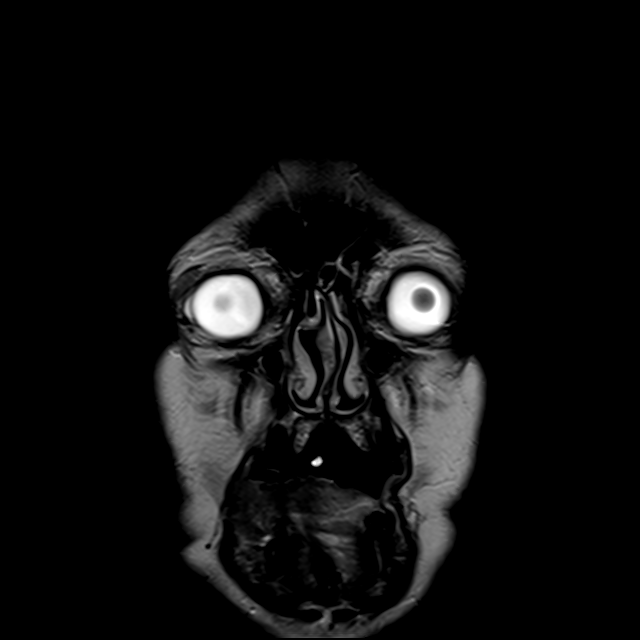
[im 29/29]
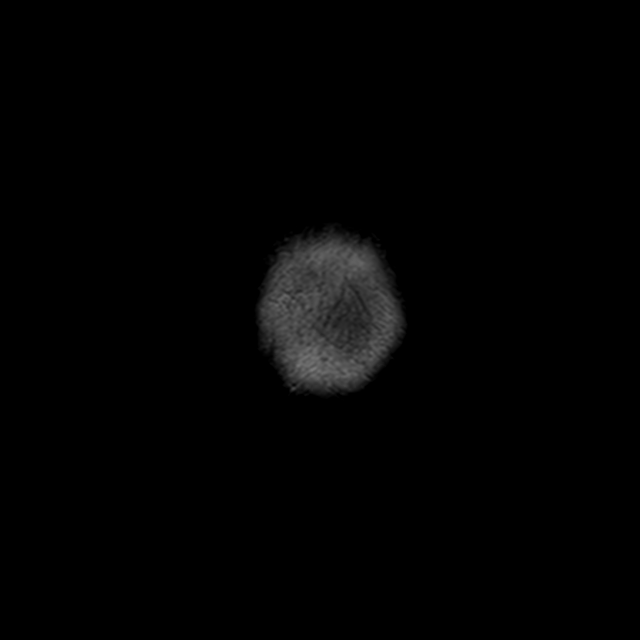

[44 of 48 positions shown; findings below may reference images not displayed]

FINDINGS: Brain: No acute infarction, hemorrhage, hydrocephalus, extra-axial
collection or mass lesion. Normal for age cerebral volume. No
significant white matter disease.

Symmetric FLAIR hyperintense subarachnoid spaces near the vertex
related to hyperoxygenation from mechanical ventilation.

Vascular: Normal flow voids.

Skull and upper cervical spine: Normal marrow signal. Partial empty
sella.

Sinuses/Orbits: Diffuse sinus opacity, most notable in the RIGHT
maxillary region, likely related to recumbency. Negative orbits.

Other: BILATERAL mastoid effusions.
IMPRESSION: MRI of the brain is negative for anoxic injury or acute
infarction/hemorrhage.

No evidence of increased intracranial pressure. No focal
abnormalities are detected.

## 2021-06-20 ENCOUNTER — Emergency Department: Payer: 59

## 2021-06-20 ENCOUNTER — Inpatient Hospital Stay
Admission: EM | Admit: 2021-06-20 | Discharge: 2021-06-24 | DRG: 189 | Disposition: A | Discharge status: Home / Self Care | Payer: 59 | Attending: Internal Medicine | Admitting: Internal Medicine

## 2021-06-20 ENCOUNTER — Encounter: Payer: Self-pay | Admitting: Emergency Medicine

## 2021-06-20 ENCOUNTER — Other Ambulatory Visit: Payer: Self-pay

## 2021-06-20 DIAGNOSIS — J441 Chronic obstructive pulmonary disease with (acute) exacerbation: Secondary | ICD-10-CM | POA: Diagnosis not present

## 2021-06-20 DIAGNOSIS — E876 Hypokalemia: Secondary | ICD-10-CM | POA: Diagnosis present

## 2021-06-20 DIAGNOSIS — K3184 Gastroparesis: Secondary | ICD-10-CM | POA: Diagnosis present

## 2021-06-20 DIAGNOSIS — Z90710 Acquired absence of both cervix and uterus: Secondary | ICD-10-CM

## 2021-06-20 DIAGNOSIS — F068 Other specified mental disorders due to known physiological condition: Secondary | ICD-10-CM

## 2021-06-20 DIAGNOSIS — G4733 Obstructive sleep apnea (adult) (pediatric): Secondary | ICD-10-CM | POA: Diagnosis present

## 2021-06-20 DIAGNOSIS — Z79899 Other long term (current) drug therapy: Secondary | ICD-10-CM

## 2021-06-20 DIAGNOSIS — F32A Depression, unspecified: Secondary | ICD-10-CM | POA: Diagnosis present

## 2021-06-20 DIAGNOSIS — G894 Chronic pain syndrome: Secondary | ICD-10-CM | POA: Diagnosis present

## 2021-06-20 DIAGNOSIS — F1120 Opioid dependence, uncomplicated: Secondary | ICD-10-CM | POA: Diagnosis present

## 2021-06-20 DIAGNOSIS — I110 Hypertensive heart disease with heart failure: Secondary | ICD-10-CM | POA: Diagnosis present

## 2021-06-20 DIAGNOSIS — J9622 Acute and chronic respiratory failure with hypercapnia: Secondary | ICD-10-CM | POA: Diagnosis present

## 2021-06-20 DIAGNOSIS — J439 Emphysema, unspecified: Secondary | ICD-10-CM | POA: Diagnosis present

## 2021-06-20 DIAGNOSIS — I5033 Acute on chronic diastolic (congestive) heart failure: Secondary | ICD-10-CM | POA: Diagnosis present

## 2021-06-20 DIAGNOSIS — I248 Other forms of acute ischemic heart disease: Secondary | ICD-10-CM | POA: Diagnosis present

## 2021-06-20 DIAGNOSIS — J189 Pneumonia, unspecified organism: Secondary | ICD-10-CM | POA: Diagnosis present

## 2021-06-20 DIAGNOSIS — Z6830 Body mass index (BMI) 30.0-30.9, adult: Secondary | ICD-10-CM

## 2021-06-20 DIAGNOSIS — E669 Obesity, unspecified: Secondary | ICD-10-CM | POA: Diagnosis present

## 2021-06-20 DIAGNOSIS — Z825 Family history of asthma and other chronic lower respiratory diseases: Secondary | ICD-10-CM

## 2021-06-20 DIAGNOSIS — K529 Noninfective gastroenteritis and colitis, unspecified: Secondary | ICD-10-CM | POA: Diagnosis present

## 2021-06-20 DIAGNOSIS — Z9981 Dependence on supplemental oxygen: Secondary | ICD-10-CM

## 2021-06-20 DIAGNOSIS — F419 Anxiety disorder, unspecified: Secondary | ICD-10-CM | POA: Diagnosis present

## 2021-06-20 DIAGNOSIS — G629 Polyneuropathy, unspecified: Secondary | ICD-10-CM | POA: Diagnosis present

## 2021-06-20 DIAGNOSIS — K5730 Diverticulosis of large intestine without perforation or abscess without bleeding: Secondary | ICD-10-CM | POA: Diagnosis present

## 2021-06-20 DIAGNOSIS — J9621 Acute and chronic respiratory failure with hypoxia: Principal | ICD-10-CM | POA: Diagnosis present

## 2021-06-20 DIAGNOSIS — Z79890 Hormone replacement therapy (postmenopausal): Secondary | ICD-10-CM

## 2021-06-20 DIAGNOSIS — R0902 Hypoxemia: Secondary | ICD-10-CM | POA: Diagnosis present

## 2021-06-20 DIAGNOSIS — R778 Other specified abnormalities of plasma proteins: Secondary | ICD-10-CM | POA: Diagnosis present

## 2021-06-20 DIAGNOSIS — K219 Gastro-esophageal reflux disease without esophagitis: Secondary | ICD-10-CM | POA: Diagnosis present

## 2021-06-20 DIAGNOSIS — J470 Bronchiectasis with acute lower respiratory infection: Secondary | ICD-10-CM | POA: Diagnosis present

## 2021-06-20 DIAGNOSIS — Z8249 Family history of ischemic heart disease and other diseases of the circulatory system: Secondary | ICD-10-CM

## 2021-06-20 DIAGNOSIS — D123 Benign neoplasm of transverse colon: Secondary | ICD-10-CM | POA: Diagnosis present

## 2021-06-20 DIAGNOSIS — R634 Abnormal weight loss: Secondary | ICD-10-CM | POA: Diagnosis present

## 2021-06-20 DIAGNOSIS — Z87891 Personal history of nicotine dependence: Secondary | ICD-10-CM

## 2021-06-20 DIAGNOSIS — Z8674 Personal history of sudden cardiac arrest: Secondary | ICD-10-CM

## 2021-06-20 DIAGNOSIS — Z8042 Family history of malignant neoplasm of prostate: Secondary | ICD-10-CM

## 2021-06-20 DIAGNOSIS — Z933 Colostomy status: Secondary | ICD-10-CM

## 2021-06-20 DIAGNOSIS — Z7951 Long term (current) use of inhaled steroids: Secondary | ICD-10-CM

## 2021-06-20 DIAGNOSIS — I5032 Chronic diastolic (congestive) heart failure: Secondary | ICD-10-CM | POA: Diagnosis present

## 2021-06-20 DIAGNOSIS — J9601 Acute respiratory failure with hypoxia: Secondary | ICD-10-CM | POA: Diagnosis not present

## 2021-06-20 DIAGNOSIS — K5900 Constipation, unspecified: Secondary | ICD-10-CM | POA: Diagnosis present

## 2021-06-20 DIAGNOSIS — I509 Heart failure, unspecified: Secondary | ICD-10-CM

## 2021-06-20 DIAGNOSIS — Z9049 Acquired absence of other specified parts of digestive tract: Secondary | ICD-10-CM

## 2021-06-20 DIAGNOSIS — R112 Nausea with vomiting, unspecified: Secondary | ICD-10-CM | POA: Diagnosis not present

## 2021-06-20 DIAGNOSIS — K2971 Gastritis, unspecified, with bleeding: Secondary | ICD-10-CM | POA: Diagnosis present

## 2021-06-20 DIAGNOSIS — D128 Benign neoplasm of rectum: Secondary | ICD-10-CM | POA: Diagnosis present

## 2021-06-20 DIAGNOSIS — D649 Anemia, unspecified: Secondary | ICD-10-CM | POA: Diagnosis present

## 2021-06-20 DIAGNOSIS — Z833 Family history of diabetes mellitus: Secondary | ICD-10-CM

## 2021-06-20 DIAGNOSIS — Z83438 Family history of other disorder of lipoprotein metabolism and other lipidemia: Secondary | ICD-10-CM

## 2021-06-20 DIAGNOSIS — R7989 Other specified abnormal findings of blood chemistry: Secondary | ICD-10-CM | POA: Diagnosis present

## 2021-06-20 DIAGNOSIS — Z20822 Contact with and (suspected) exposure to covid-19: Secondary | ICD-10-CM | POA: Diagnosis present

## 2021-06-20 DIAGNOSIS — Z8719 Personal history of other diseases of the digestive system: Secondary | ICD-10-CM

## 2021-06-20 DIAGNOSIS — R10819 Abdominal tenderness, unspecified site: Secondary | ICD-10-CM | POA: Diagnosis present

## 2021-06-20 LAB — CBC
HCT: 41.6 % (ref 36.0–46.0)
Hemoglobin: 12.5 g/dL (ref 12.0–15.0)
MCH: 28.8 pg (ref 26.0–34.0)
MCHC: 30 g/dL (ref 30.0–36.0)
MCV: 95.9 fL (ref 80.0–100.0)
Platelets: 301 10*3/uL (ref 150–400)
RBC: 4.34 MIL/uL (ref 3.87–5.11)
RDW: 13.2 % (ref 11.5–15.5)
WBC: 19.2 10*3/uL — ABNORMAL HIGH (ref 4.0–10.5)
nRBC: 0 % (ref 0.0–0.2)

## 2021-06-20 LAB — COMPREHENSIVE METABOLIC PANEL
ALT: 12 U/L (ref 0–44)
AST: 18 U/L (ref 15–41)
Albumin: 3.2 g/dL — ABNORMAL LOW (ref 3.5–5.0)
Alkaline Phosphatase: 87 U/L (ref 38–126)
Anion gap: 11 (ref 5–15)
BUN: 7 mg/dL (ref 6–20)
CO2: 41 mmol/L — ABNORMAL HIGH (ref 22–32)
Calcium: 8.6 mg/dL — ABNORMAL LOW (ref 8.9–10.3)
Chloride: 86 mmol/L — ABNORMAL LOW (ref 98–111)
Creatinine, Ser: 0.74 mg/dL (ref 0.44–1.00)
GFR, Estimated: >60 mL/min (ref >60)
Glucose, Bld: 117 mg/dL — ABNORMAL HIGH (ref 70–99)
Potassium: 2.7 mmol/L — CL (ref 3.5–5.1)
Sodium: 138 mmol/L (ref 135–145)
Total Bilirubin: 1 mg/dL (ref 0.3–1.2)
Total Protein: 8.2 g/dL — ABNORMAL HIGH (ref 6.5–8.1)

## 2021-06-20 LAB — BLOOD GAS, VENOUS
Acid-Base Excess: 17.4 mmol/L — ABNORMAL HIGH (ref 0.0–2.0)
Bicarbonate: 46.6 mmol/L — ABNORMAL HIGH (ref 20.0–28.0)
O2 Saturation: 63.2 %
Patient temperature: 37
pCO2, Ven: 77 mmHg (ref 44–60)
pH, Ven: 7.39 (ref 7.25–7.43)
pO2, Ven: 41 mmHg (ref 32–45)

## 2021-06-20 LAB — TROPONIN I (HIGH SENSITIVITY)
Troponin I (High Sensitivity): 19 ng/L — ABNORMAL HIGH (ref <18)
Troponin I (High Sensitivity): 55 ng/L — ABNORMAL HIGH (ref <18)

## 2021-06-20 LAB — BRAIN NATRIURETIC PEPTIDE: B Natriuretic Peptide: 193.5 pg/mL — ABNORMAL HIGH (ref 0.0–100.0)

## 2021-06-20 LAB — SARS CORONAVIRUS 2 BY RT PCR: SARS Coronavirus 2 by RT PCR: NEGATIVE

## 2021-06-20 MED ORDER — FUROSEMIDE 10 MG/ML IJ SOLN
60.0000 mg | Freq: Once | INTRAMUSCULAR | Status: AC
  Administered 2021-06-20: 60 mg via INTRAVENOUS
  Filled 2021-06-20: qty 8

## 2021-06-20 MED ORDER — IPRATROPIUM-ALBUTEROL 0.5-2.5 (3) MG/3ML IN SOLN
3.0000 mL | Freq: Once | RESPIRATORY_TRACT | Status: AC
  Administered 2021-06-20: 3 mL via RESPIRATORY_TRACT
  Filled 2021-06-20: qty 3

## 2021-06-20 MED ORDER — POTASSIUM CHLORIDE CRYS ER 20 MEQ PO TBCR
40.0000 meq | EXTENDED_RELEASE_TABLET | Freq: Once | ORAL | Status: AC
  Administered 2021-06-20: 40 meq via ORAL
  Filled 2021-06-20: qty 2

## 2021-06-20 MED ORDER — POTASSIUM CHLORIDE 10 MEQ/100ML IV SOLN
10.0000 meq | INTRAVENOUS | Status: AC
  Administered 2021-06-20 (×2): 10 meq via INTRAVENOUS
  Filled 2021-06-20: qty 100

## 2021-06-20 MED ORDER — POTASSIUM CHLORIDE 10 MEQ/100ML IV SOLN
10.0000 meq | Freq: Once | INTRAVENOUS | Status: AC
  Administered 2021-06-20: 10 meq via INTRAVENOUS
  Filled 2021-06-20: qty 100

## 2021-06-20 MED ORDER — SODIUM CHLORIDE 0.9 % IV SOLN
500.0000 mg | INTRAVENOUS | Status: DC
  Administered 2021-06-20: 500 mg via INTRAVENOUS
  Filled 2021-06-20 (×2): qty 5

## 2021-06-20 MED ORDER — SODIUM CHLORIDE 0.9 % IV SOLN
2.0000 g | INTRAVENOUS | Status: DC
  Administered 2021-06-20 – 2021-06-23 (×4): 2 g via INTRAVENOUS
  Filled 2021-06-20 (×5): qty 20

## 2021-06-20 NOTE — H&P (Signed)
History and Physical    Patient: Misty Green:[id number] DOB: February 18, 1970 DOA: 06/20/2021 DOS: the patient was seen and examined on 06/20/2021 PCP: [name], MD  Patient coming from: Home  Chief Complaint:  Chief Complaint  Patient presents with   Respiratory Distress   HPI: Misty Green is a 51 y.o. female with medical history significant of COPD, remote tobacco abuse, hypertension, diastolic CHF, peripheral neuropathy, chronic pain syndrome, history of cardiac arrest, obstructive sleep apnea, GERD who started having upper respite tract infection type about a week ago.  At that time she started feeling weak and started some cough symptoms have gradually worsened over the last week.  Patient is on chronic oxygen therapy at home at 3 L/min.  Today however she was still hypoxic when EMS arrived her oxygen sat was in the 70s on her home regimen.  Patient was placed on CPAP and brought to the ER.  In the ER she was found to be having significant hypoxia.  Patient is currently on BiPAP.  Work-up shows significant hypokalemia with potassium 2.7 venous pH showed hypoxia with hypercarbia.  BNP 193.  Patient also has findings consistent with pneumonia as well as CHF on x-ray.  She is being admitted with acute on chronic respiratory failure with hypoxia and findings consistent with COPD exacerbation, CHF exacerbation and pneumonia Review of Systems: As mentioned in the history of present illness. All other systems reviewed and are negative. Past Medical History:  Diagnosis Date   Cardiac arrest ([place]) 05/12/2018   CHF (congestive heart failure) (HCC)    Chicken pox    COPD (chronic obstructive pulmonary disease) (HCC)    Current smoker    Dyspnea    GERD (gastroesophageal reflux disease)    Hypertension    Malfunction of colostomy and enterostomy (HCC)    Opiate abuse, continuous ([place])    Oxygen deficit    Sleep apnea    Past Surgical History:  Procedure Laterality Date    ABDOMINAL HYSTERECTOMY     BACK SURGERY     cesction     COLOSTOMY  01/13/2018   Procedure: COLOSTOMY Creation;  Surgeon: Jules Husbands, MD;  Location: [facility] ORS;  Service: General;;   HERNIA REPAIR     INCISIONAL HERNIA REPAIR     lower midline laparotomy incision (hysterectomy), repaired with mesh   IR FLUORO GUIDE CV LINE RIGHT  05/26/2018   IR REMOVAL TUN CV CATH W/O FL  06/02/2018   IR US GUIDE VASC ACCESS RIGHT  05/26/2018   LAPAROSCOPIC CHOLECYSTECTOMY  2012   LAPAROSCOPIC LYSIS OF ADHESIONS  01/13/2018   Procedure: LAPAROSCOPIC LYSIS OF ADHESIONS;  Surgeon: Jules Husbands, MD;  Location: [facility] ORS;  Service: General;;   LAPAROSCOPIC SIGMOID COLECTOMY  01/13/2018   Procedure: LAPAROSCOPIC SIGMOID COLECTOMY;  Surgeon: Jules Husbands, MD;  Location: [facility] ORS;  Service: General;;   LUMBAR SPINE SURGERY     ORIF WRIST FRACTURE Right 08/25/2015   Procedure: OPEN REDUCTION INTERNAL FIXATION (ORIF) WRIST FRACTURE;  Surgeon: Corky Mull, MD;  Location: [facility] ORS;  Service: Orthopedics;  Laterality: Right;   OVARIAN CYST REMOVAL     Social History:  reports that she has quit smoking. Her smoking use included cigarettes. She has a 15.00 pack-year smoking history. She has never used smokeless tobacco. She reports current alcohol use. She reports current drug use. Drug: Marijuana.  Allergies  Allergen Reactions   Other Nausea And Vomiting    Anti-depressent that starts with t  Family History  Problem Relation Age of Onset   Diabetes Mother    COPD Mother    Hypertension Father    Diabetes Father    Heart disease Father    Hyperlipidemia Father    Prostate cancer Father     Prior to Admission medications   Medication Sig Start Date End Date Taking? Authorizing Provider  acetaminophen (TYLENOL) 325 MG tablet Take 1-2 tablets (325-650 mg total) by mouth every 4 (four) hours as needed for mild pain. Patient taking differently: Take 650 mg by mouth 3 (three) times daily. 06/05/18    Love, Ivan Anchors, PA-C  albuterol (PROVENTIL HFA;VENTOLIN HFA) 108 (90 Base) MCG/ACT inhaler Inhale 2 puffs into the lungs every 6 (six) hours as needed for wheezing or shortness of breath.     [provider]  albuterol (PROVENTIL) (2.5 MG/3ML) 0.083% nebulizer solution Take 2.5 mg by nebulization every 6 (six) hours as needed for wheezing or shortness of breath. Inhale one vial via nebulizer 4 times a day as directed.    [provider]  amLODipine (NORVASC) 10 MG tablet Take 10 mg by mouth daily.    [provider]  budesonide (PULMICORT) 0.5 MG/2ML nebulizer solution Take 2 mLs (0.5 mg total) by nebulization 2 (two) times daily. 02/11/20   Eugenie Filler, MD  buPROPion (WELLBUTRIN) 75 MG tablet Take 75 mg by mouth 2 (two) times daily.    [provider]  DALIRESP 500 MCG TABS tablet TAKE 1 TABLET BY MOUTH DAILY Patient taking differently: Take 500 mcg by mouth daily. 08/27/19   Lavera Guise, MD  escitalopram (LEXAPRO) 20 MG tablet TAKE 1 TABLET BY MOUTH EVERY DAY Patient taking differently: Take 20 mg by mouth daily. 07/20/19   Ronnell Freshwater, NP  feeding supplement (ENSURE ENLIVE / ENSURE PLUS) LIQD Take 237 mLs by mouth 3 (three) times daily between meals. 02/11/20   Eugenie Filler, MD  fluticasone (FLONASE) 50 MCG/ACT nasal spray Place 1 spray into both nostrils at bedtime. 02/11/20   Eugenie Filler, MD  Fluticasone-Umeclidin-Vilant (TRELEGY ELLIPTA) 100-62.5-25 MCG/INH AEPB Inhale 1 each into the lungs daily. 07/19/19   Kendell Bane, NP  furosemide (LASIX) 40 MG tablet Take 40 mg by mouth daily. 01/20/20   [provider]  gabapentin (NEURONTIN) 400 MG capsule Take 1 capsule (400 mg total) by mouth 3 (three) times daily. 07/12/19   Mercy Riding, MD  guaiFENesin-codeine 100-10 MG/5ML syrup Take 10 mLs by mouth every 6 (six) hours as needed for cough. 02/11/20   Eugenie Filler, MD  hydrOXYzine (ATARAX/VISTARIL) 25 MG tablet Take 1 tablet  (25 mg total) by mouth 3 (three) times daily. 02/11/20   Eugenie Filler, MD  Hyoscyamine Sulfate SL (LEVSIN/SL) 0.125 MG SUBL Place under the tongue every 6 (six) hours as needed.    [provider]  ipratropium-albuterol (DUONEB) 0.5-2.5 (3) MG/3ML SOLN Take 3 mLs by nebulization 3 (three) times daily. 02/11/20   Eugenie Filler, MD  Melatonin 10 MG TABS Take 30 mg by mouth at bedtime.    [provider]  montelukast (SINGULAIR) 10 MG tablet TAKE 1 TABLET BY MOUTH EVERYDAY AT BEDTIME Patient taking differently: Take 10 mg by mouth at bedtime. 07/30/19   Kendell Bane, NP  Multiple Vitamins-Minerals (MULTIVITAMIN WITH MINERALS) tablet Take 1 tablet by mouth at bedtime.     [provider]  nicotine (NICODERM CQ - DOSED IN MG/24 HOURS) 14 mg/24hr patch Place 1  patch (14 mg total) onto the skin daily as needed (for smoking cessation). 02/11/20   Eugenie Filler, MD  ondansetron (ZOFRAN) 4 MG tablet Take 4 mg by mouth every 8 (eight) hours as needed for nausea or vomiting.    [provider]  OZEMPIC, 0.25 OR 0.5 MG/DOSE, 2 MG/1.5ML SOPN Inject 0.25 mg into the skin once a week. 10/02/19   [provider]  pantoprazole (PROTONIX) 40 MG tablet Take 40 mg by mouth 2 (two) times daily. 11/11/19   [provider]  polyethylene glycol (MIRALAX / GLYCOLAX) 17 g packet Take 17 g by mouth daily. 02/12/20   Eugenie Filler, MD  PRESCRIPTION MEDICATION Inhale into the lungs at bedtime. BiPap    [provider]  promethazine (PHENERGAN) 25 MG tablet Take 25 mg by mouth every 8 (eight) hours as needed for nausea or vomiting.    [provider]  QUEtiapine (SEROQUEL) 25 MG tablet Take 1 tablet (25 mg total) by mouth at bedtime. 02/11/20   Eugenie Filler, MD  senna-docusate (SENOKOT-S) 8.6-50 MG tablet Take 1 tablet by mouth at bedtime as needed for mild constipation. 02/11/20   Eugenie Filler, MD  Vitamin D, Ergocalciferol,  (DRISDOL) 1.25 MG (50000 UNIT) CAPS capsule Take 50,000 Units by mouth once a week. 11/13/19   [provider]    Physical Exam: Vitals:   06/20/21 1749 06/20/21 1757 06/20/21 1830 06/20/21 1900  BP:  (!) 172/91 121/90 (!) 147/88  Pulse:  (!) 108 (!) 108 (!) 109  Resp:  20 (!) 23 20  Temp:  99.9 F (37.7 C)    TempSrc:  Axillary    SpO2: (!) 74% 99% 96% 97%   General: Obese, in obvious respiratory distress, on BiPAP HEENT: PERRL, EOMI no pallor or jaundice Neck: Supple, no JVD no lymphadenopathy Respiratory: Decreased air entry bilaterally with marked expiratory wheezing, use of extra muscles of respiration Cardiovascular: Tachycardia, Abdomen: Soft, nontender with positive bowel sounds Extremities: Trace edema Neuro: Nonfocal exam Psych: Anxious, awake alert oriented x3 Skin exam: No rashes  Data Reviewed:  Potassium is 2.7, sodium 138, chloride 86 CO2 3041, glucose 117.  Albumin 3.2 total protein 8.2.  BNP 193 troponin 19.  White count 19.2 normal hemoglobin.  Chest x-ray showed constellation of findings reflecting pulmonary edema with scattered areas of atelectasis and atypical infection most likely.  COVID-19 screen pending  Assessment and Plan:  #1 acute on chronic respiratory failure with hypoxemia: Most likely a combination of COPD exacerbation, CHF exacerbation and early pneumonia.  Patient will be admitted and initiated on IV Rocephin and Zithromax.  We will do the viral panel.  Steroids for COPD exacerbation.  Continue with her home Lasix.  #2 essential hypertension: Patient has been on medications at home including amlodipine.  We will confirm home regimen and resume.  #3 hypokalemia: Severe with potassium 2.7.  Aggressively replete potassium  #4 acute exacerbation of COPD: Continue with breathing treatments, steroids, antibiotics  #5 diastolic CHF: We will resume Lasix and other home regimen  #6 anxiety with depression: Patient has been on Wellbutrin and  Seroquel in the past.  We will confirm that she is still taking on resume.  #7 chronic pain: Confirm on resume home regimen    Advance Care Planning:   Code Status: Prior partial code  Consults: None  Family Communication: Husband at bedside  Severity of Illness: The appropriate patient status for this patient is INPATIENT. Inpatient status is judged to be  reasonable and necessary in order to provide the required intensity of service to ensure the patient's safety. The patient's presenting symptoms, physical exam findings, and initial radiographic and laboratory data in the context of their chronic comorbidities is felt to place them at high risk for further clinical deterioration. Furthermore, it is not anticipated that the patient will be medically stable for discharge from the hospital within 2 midnights of admission.   * I certify that at the point of admission it is my clinical judgment that the patient will require inpatient hospital care spanning beyond 2 midnights from the point of admission due to high intensity of service, high risk for further deterioration and high frequency of surveillance required.*  AuthorBarbette Merino, MD 06/20/2021 9:10 PM  For on call review [email or web address].

## 2021-06-20 NOTE — ED Triage Notes (Signed)
Patient to ED via [facility] from home in respiratory distress. Patient states SOB started at 3am and took 3 neb treatments at home with no relief. Wears 3L baseline. Currently on CPAP with EMS.

## 2021-06-20 NOTE — ED Provider Notes (Signed)
[facility] Provider Note    Event Date/Time   First MD Initiated Contact with Patient 06/20/21 1800     (approximate)  History   Chief Complaint: Respiratory Distress  HPI  Misty Green is a 51 y.o. female with a past medical history of CHF, COPD on 3 L of oxygen, hypertension, presents to the emergency department for shortness of breath.  Patient states she has had worsening shortness of breath over the past 2 days but acutely worse today.  States she has been coughing, no known fever although 99.9 in the emergency department.  Patient received 125 mg of Solu-Medrol as well as a DuoNeb in route to the hospital.  EMS states patient initially satting 74% on 3 L upon their arrival.  They placed the patient on CPAP.  Upon arrival to the emergency department patient satting around 90% on CPAP switched to BiPAP and is currently satting in the upper 90s.  Patient states she is feeling more comfortable on the BiPAP mask.  Denies any chest pain.  Physical Exam   Triage Vital Signs: ED Triage Vitals  Enc Vitals Group     BP 06/20/21 1757 (!) 172/91     Pulse Rate 06/20/21 1757 (!) 108     Resp 06/20/21 1757 20     Temp 06/20/21 1757 99.9 F (37.7 C)     Temp Source 06/20/21 1757 Axillary     SpO2 06/20/21 1749 (!) 74 %     Weight --      Height --      Head Circumference --      Peak Flow --      Pain Score 06/20/21 1753 0     Pain Loc --      Pain Edu? --      Excl. in [place]? --     Most recent vital signs: Vitals:   06/20/21 1749 06/20/21 1757  BP:  (!) 172/91  Pulse:  (!) 108  Resp:  20  Temp:  99.9 F (37.7 C)  SpO2: (!) 74% 99%    General: Awake, no distress.  CV:  Good peripheral perfusion.  Regular rate and rhythm around 100 bpm Resp:  Increased respiratory effort tachypneic around 25 breaths/min.  Diminished breath sounds bilaterally. Abd:  No distention.  Soft, nontender.  No rebound or guarding.    ED Results / Procedures /  Treatments   EKG  EKG viewed and interpreted by myself shows sinus tachycardia 112 bpm with a narrow QRS, normal axis, normal intervals, nonspecific ST changes.  Electrical interference due to respiratory pattern.  RADIOLOGY  Patient appears to have diffuse opacities bilaterally according to my interpretation of the images. Radiology is read the chest x-ray is pulmonary edema versus atypical infection.   MEDICATIONS ORDERED IN ED: Medications - No data to display   IMPRESSION / MDM / [place] / ED COURSE  I reviewed the triage vital signs and the nursing notes.  Patient's presentation is most consistent with acute presentation with potential threat to life or bodily function.  Patient presents to the emergency department for worsening shortness of breath found to be satting 74% on her typical 3 L of oxygen placed on CPAP and brought to the emergency department.  Patient transition to BiPAP upon arrival.  Now satting 99% on BiPAP.  Patient is found to be borderline febrile 99.9.  Patient has received 125 mg of Solu-Medrol as well as a DuoNeb prior to arrival.  We will continue with 2 additional DuoNebs we will continue with BiPAP.  We will check labs including cardiac enzymes and a BNP, chest x-ray and continue to closely monitor.  Differential would include COPD exacerbation, CHF exacerbation, ACS, pneumonia, COVID less likely PE.  Patient's blood gas has resulted showing a PCO2 of 77 most consistent with COPD exacerbation.  Patient's COVID is negative.  CBC shows leukocytosis 19,000.  Borderline temperature 99.9.  Chemistry shows hypokalemia 2.7.  We will replete with IV and oral potassium.  Troponin slightly elevated at 19 BNP is also elevated at 193.  Patient's chest x-ray is resulted showing pulmonary edema versus atypical infection.  We will dose IV Lasix after IV potassium administration.  Given the possible atypical infection although COVID-negative we will cover with  antibiotics given her low-grade temperature and leukocytosis.  Patient will be admitted to the hospital service for ongoing work-up and treatment.  Remains on BiPAP.  Suspect combination of COPD, CHF and possible pneumonia.   CRITICAL CARE Performed by: Harvest Dark   Total critical care time: 45 minutes  Critical care time was exclusive of separately billable procedures and treating other patients.  Critical care was necessary to treat or prevent imminent or life-threatening deterioration.  Critical care was time spent personally by me on the following activities: development of treatment plan with patient and/or surrogate as well as nursing, discussions with consultants, evaluation of patient's response to treatment, examination of patient, obtaining history from patient or surrogate, ordering and performing treatments and interventions, ordering and review of laboratory studies, ordering and review of radiographic studies, pulse oximetry and re-evaluation of patient's condition.   FINAL CLINICAL IMPRESSION(S) / ED DIAGNOSES   COPD exacerbation Dyspnea Pneumonia CHF exacerbation  Note:  This document was prepared using Dragon voice recognition software and may include unintentional dictation errors.   Harvest Dark, MD 06/20/21 1944

## 2021-06-20 NOTE — ED Notes (Signed)
K=2.7 c/o lab , ERMD made aware

## 2021-06-20 NOTE — ED Notes (Addendum)
Troponin=55 c/o lab , ERMD made aware

## 2021-06-21 ENCOUNTER — Inpatient Hospital Stay: Payer: 59

## 2021-06-21 ENCOUNTER — Encounter: Payer: Self-pay | Admitting: Internal Medicine

## 2021-06-21 DIAGNOSIS — R634 Abnormal weight loss: Secondary | ICD-10-CM | POA: Diagnosis present

## 2021-06-21 DIAGNOSIS — R112 Nausea with vomiting, unspecified: Secondary | ICD-10-CM | POA: Diagnosis present

## 2021-06-21 DIAGNOSIS — R10819 Abdominal tenderness, unspecified site: Secondary | ICD-10-CM | POA: Diagnosis present

## 2021-06-21 DIAGNOSIS — R778 Other specified abnormalities of plasma proteins: Secondary | ICD-10-CM | POA: Diagnosis present

## 2021-06-21 DIAGNOSIS — Z8719 Personal history of other diseases of the digestive system: Secondary | ICD-10-CM

## 2021-06-21 LAB — CBC
HCT: 41.4 % (ref 36.0–46.0)
Hemoglobin: 12.4 g/dL (ref 12.0–15.0)
MCH: 28.5 pg (ref 26.0–34.0)
MCHC: 30 g/dL (ref 30.0–36.0)
MCV: 95.2 fL (ref 80.0–100.0)
Platelets: 317 10*3/uL (ref 150–400)
RBC: 4.35 MIL/uL (ref 3.87–5.11)
RDW: 13.2 % (ref 11.5–15.5)
WBC: 12.8 10*3/uL — ABNORMAL HIGH (ref 4.0–10.5)
nRBC: 0 % (ref 0.0–0.2)

## 2021-06-21 LAB — COMPREHENSIVE METABOLIC PANEL
ALT: 13 U/L (ref 0–44)
AST: 17 U/L (ref 15–41)
Albumin: 3.3 g/dL — ABNORMAL LOW (ref 3.5–5.0)
Alkaline Phosphatase: 96 U/L (ref 38–126)
Anion gap: 13 (ref 5–15)
BUN: 9 mg/dL (ref 6–20)
CO2: 39 mmol/L — ABNORMAL HIGH (ref 22–32)
Calcium: 8.6 mg/dL — ABNORMAL LOW (ref 8.9–10.3)
Chloride: 90 mmol/L — ABNORMAL LOW (ref 98–111)
Creatinine, Ser: 0.75 mg/dL (ref 0.44–1.00)
GFR, Estimated: >60 mL/min (ref >60)
Glucose, Bld: 140 mg/dL — ABNORMAL HIGH (ref 70–99)
Potassium: 3.7 mmol/L (ref 3.5–5.1)
Sodium: 142 mmol/L (ref 135–145)
Total Bilirubin: 0.7 mg/dL (ref 0.3–1.2)
Total Protein: 8.2 g/dL — ABNORMAL HIGH (ref 6.5–8.1)

## 2021-06-21 LAB — CREATININE, SERUM
Creatinine, Ser: 0.68 mg/dL (ref 0.44–1.00)
GFR, Estimated: >60 mL/min (ref >60)

## 2021-06-21 LAB — HIV ANTIBODY (ROUTINE TESTING W REFLEX): HIV Screen 4th Generation wRfx: NONREACTIVE

## 2021-06-21 MED ORDER — GUAIFENESIN ER 600 MG PO TB12
600.0000 mg | ORAL_TABLET | Freq: Two times a day (BID) | ORAL | Status: DC
  Administered 2021-06-21 – 2021-06-24 (×7): 600 mg via ORAL
  Filled 2021-06-21 (×7): qty 1

## 2021-06-21 MED ORDER — METHYLPREDNISOLONE SODIUM SUCC 40 MG IJ SOLR
40.0000 mg | Freq: Two times a day (BID) | INTRAMUSCULAR | Status: DC
Start: 2021-06-21 — End: 2021-06-25
  Administered 2021-06-21 – 2021-06-24 (×7): 40 mg via INTRAVENOUS
  Filled 2021-06-21 (×7): qty 1

## 2021-06-21 MED ORDER — BUDESONIDE 0.25 MG/2ML IN SUSP
0.2500 mg | Freq: Two times a day (BID) | RESPIRATORY_TRACT | Status: DC
Start: 2021-06-21 — End: 2021-06-25
  Administered 2021-06-21 – 2021-06-24 (×6): 0.25 mg via RESPIRATORY_TRACT
  Filled 2021-06-21 (×6): qty 2

## 2021-06-21 MED ORDER — IOHEXOL 300 MG/ML  SOLN
100.0000 mL | Freq: Once | INTRAMUSCULAR | Status: AC | PRN
  Administered 2021-06-21: 100 mL via INTRAVENOUS

## 2021-06-21 MED ORDER — METOPROLOL TARTRATE 5 MG/5ML IV SOLN: 5.0000 mg | Freq: Four times a day (QID) | INTRAVENOUS | Status: DC | PRN

## 2021-06-21 MED ORDER — ACETAMINOPHEN 650 MG RE SUPP
650.0000 mg | Freq: Four times a day (QID) | RECTAL | Status: DC | PRN
Start: 2021-06-21 — End: 2021-06-22

## 2021-06-21 MED ORDER — MORPHINE SULFATE (PF) 2 MG/ML IV SOLN
2.0000 mg | INTRAVENOUS | Status: DC | PRN
  Administered 2021-06-21 – 2021-06-22 (×8): 2 mg via INTRAVENOUS
  Filled 2021-06-21 (×9): qty 1

## 2021-06-21 MED ORDER — IPRATROPIUM BROMIDE 0.02 % IN SOLN
0.5000 mg | Freq: Four times a day (QID) | RESPIRATORY_TRACT | Status: DC
  Administered 2021-06-21 – 2021-06-23 (×11): 0.5 mg via RESPIRATORY_TRACT
  Filled 2021-06-21 (×11): qty 2.5

## 2021-06-21 MED ORDER — FUROSEMIDE 40 MG PO TABS
40.0000 mg | ORAL_TABLET | Freq: Every day | ORAL | Status: DC
  Administered 2021-06-21 – 2021-06-23 (×3): 40 mg via ORAL
  Filled 2021-06-21 (×3): qty 1

## 2021-06-21 MED ORDER — BUPROPION HCL 75 MG PO TABS
75.0000 mg | ORAL_TABLET | Freq: Two times a day (BID) | ORAL | Status: DC
  Administered 2021-06-21 – 2021-06-24 (×6): 75 mg via ORAL
  Filled 2021-06-21 (×8): qty 1

## 2021-06-21 MED ORDER — GABAPENTIN 400 MG PO CAPS
400.0000 mg | ORAL_CAPSULE | Freq: Three times a day (TID) | ORAL | Status: DC
  Administered 2021-06-21 – 2021-06-24 (×10): 400 mg via ORAL
  Filled 2021-06-21 (×10): qty 1

## 2021-06-21 MED ORDER — AZITHROMYCIN 500 MG IV SOLR
500.0000 mg | INTRAVENOUS | Status: DC
Start: 2021-06-21 — End: 2021-06-21

## 2021-06-21 MED ORDER — QUETIAPINE FUMARATE 25 MG PO TABS
25.0000 mg | ORAL_TABLET | Freq: Every day | ORAL | Status: DC
  Administered 2021-06-21 – 2021-06-23 (×3): 25 mg via ORAL
  Filled 2021-06-21 (×3): qty 1

## 2021-06-21 MED ORDER — ONDANSETRON HCL 4 MG PO TABS: 4.0000 mg | ORAL_TABLET | Freq: Four times a day (QID) | ORAL | Status: DC | PRN

## 2021-06-21 MED ORDER — IOHEXOL 9 MG/ML PO SOLN
500.0000 mL | ORAL | Status: AC
  Administered 2021-06-21: 500 mL via ORAL

## 2021-06-21 MED ORDER — SODIUM CHLORIDE 0.9 % IV SOLN
1.0000 g | INTRAVENOUS | Status: DC
Start: 2021-06-21 — End: 2021-06-21

## 2021-06-21 MED ORDER — LIP MEDEX EX OINT: TOPICAL_OINTMENT | CUTANEOUS | Status: DC | PRN

## 2021-06-21 MED ORDER — POTASSIUM CHLORIDE 10 MEQ/100ML IV SOLN
10.0000 meq | Freq: Once | INTRAVENOUS | Status: DC
  Filled 2021-06-21: qty 100

## 2021-06-21 MED ORDER — ONDANSETRON HCL 4 MG PO TABS: 4.0000 mg | ORAL_TABLET | Freq: Three times a day (TID) | ORAL | Status: DC | PRN

## 2021-06-21 MED ORDER — AZITHROMYCIN 250 MG PO TABS
500.0000 mg | ORAL_TABLET | Freq: Every day | ORAL | Status: DC
  Administered 2021-06-21 – 2021-06-23 (×3): 500 mg via ORAL
  Filled 2021-06-21 (×3): qty 2

## 2021-06-21 MED ORDER — ONDANSETRON HCL 4 MG/2ML IJ SOLN
4.0000 mg | Freq: Four times a day (QID) | INTRAMUSCULAR | Status: DC | PRN
  Administered 2021-06-21 – 2021-06-23 (×4): 4 mg via INTRAVENOUS
  Filled 2021-06-21 (×4): qty 2

## 2021-06-21 MED ORDER — ENOXAPARIN SODIUM 40 MG/0.4ML IJ SOSY
40.0000 mg | PREFILLED_SYRINGE | INTRAMUSCULAR | Status: DC
  Administered 2021-06-21 – 2021-06-22 (×2): 40 mg via SUBCUTANEOUS
  Filled 2021-06-21 (×2): qty 0.4

## 2021-06-21 MED ORDER — POLYETHYLENE GLYCOL 3350 17 G PO PACK
17.0000 g | PACK | Freq: Every day | ORAL | Status: DC
  Administered 2021-06-21 – 2021-06-22 (×2): 17 g via ORAL
  Filled 2021-06-21 (×2): qty 1

## 2021-06-21 MED ORDER — ACETAMINOPHEN 325 MG PO TABS: 650.0000 mg | ORAL_TABLET | Freq: Four times a day (QID) | ORAL | Status: DC | PRN

## 2021-06-21 MED ORDER — PANTOPRAZOLE SODIUM 40 MG PO TBEC
40.0000 mg | DELAYED_RELEASE_TABLET | Freq: Two times a day (BID) | ORAL | Status: DC
  Administered 2021-06-21 – 2021-06-24 (×6): 40 mg via ORAL
  Filled 2021-06-21 (×6): qty 1

## 2021-06-21 MED ORDER — ALBUTEROL SULFATE (2.5 MG/3ML) 0.083% IN NEBU
2.5000 mg | INHALATION_SOLUTION | Freq: Four times a day (QID) | RESPIRATORY_TRACT | Status: DC
  Administered 2021-06-21 – 2021-06-23 (×11): 2.5 mg via RESPIRATORY_TRACT
  Filled 2021-06-21 (×11): qty 3

## 2021-06-21 MED ORDER — BLISTEX MEDICATED EX OINT: TOPICAL_OINTMENT | CUTANEOUS | Status: DC | PRN

## 2021-06-21 MED ORDER — ESCITALOPRAM OXALATE 10 MG PO TABS
20.0000 mg | ORAL_TABLET | Freq: Every day | ORAL | Status: DC
  Administered 2021-06-21 – 2021-06-24 (×4): 20 mg via ORAL
  Filled 2021-06-21 (×4): qty 2

## 2021-06-21 MED ORDER — ROFLUMILAST 500 MCG PO TABS
500.0000 ug | ORAL_TABLET | Freq: Every day | ORAL | Status: DC
  Administered 2021-06-21 – 2021-06-24 (×4): 500 ug via ORAL
  Filled 2021-06-21 (×4): qty 1

## 2021-06-21 NOTE — Progress Notes (Signed)
Pt up to [facility] with SBA, o2 sats dropped to 79% on 3L with exertion, pt felt SOB, once back to bed o2 sats recovered to 92% on 3L.

## 2021-06-21 NOTE — Assessment & Plan Note (Signed)
Patient reports waking up daily diaphoretic and nauseous, very poor tolerance for oral intake leading to unintentional weight loss. Symptoms most consistent with gastroparesis, CT with stomach distended with PO contrast, EGD findings consistent with this as well. -- CT abdomen pelvis unrevealing -- As needed antiemetics --GI following, see recommendations --Endoscopic evaluations underway --Dietitian consulted for education on gastroparesis diet --Add Reglan if dietary changes not helpful enough (if QT not prolonged)

## 2021-06-21 NOTE — Assessment & Plan Note (Signed)
Patient was initially on CPAP, now on trilogy.  Continue BiPAP nightly and as needed

## 2021-06-21 NOTE — Assessment & Plan Note (Signed)
Baseline O2 requirement 3 L/min, uses trilogy NIV overnight and whenever sleeping at home.  Presented with progressive shortness of breath and increased oxygen requirements well above her baseline.  Required BiPAP in the ED. -- Treat COPD and pneumonia as outlined -- Supplemental O2 to maintain sats 90 to 94%, wean as tolerated -- BiPAP when sleeping and as needed --Pulmonology consulted given chest findings on CT 6/4 with "tree-in-bud nodules"

## 2021-06-21 NOTE — Hospital Course (Signed)
51 year old female past medical history of chronic respiratory failure secondary to COPD on 3 L/min O2 at baseline and trilogy when sleeping, hypertension, diastolic CHF, peripheral neuropathy, chronic pain syndrome, history of cardiac arrest, OSA, GERD, remote tobacco abuse, history of diverticulitis status post partial resection with colostomy presented to the ED on the evening of 06/20/2021 with worsening generalized weakness, cough and shortness of breath.  EMS was called and found her O2 sats 70s on her typical 3 L/min oxygen.  On arrival to the ER, patient required BiPAP and blood gas showed hypercapnia.  Chest x-ray was concerning for pneumonia and or CHF.  Admitted to the hospital and started on IV steroids and IV antibiotics.  Maintained on oral diuretic given less suspicion for decompensated CHF.  6/4: Patient reports 43 pounds weight loss in about 8 weeks due to daily nausea vomiting and not tolerating p.o. intake.  She wakes up diaphoretic and nauseous every morning.  She has abdominal pain as well and is tender on exam.  She reports history of hernia repair with mesh, colostomy due to diverticulitis in the past requiring partial colon resection.

## 2021-06-21 NOTE — Progress Notes (Signed)
Chaplain responded to Spiritual Consult for AD; chaplain provided materials, education and orientation to process.  Also offered prayer. Pt was joyful in expressing gratitude for healing; faith is very important to her [name]).  Pt will request chaplain during business hours if/when she is ready to proceed.  Misty Green, [place] Pager:  [phone]    06/21/21 2000  Clinical Encounter Type  Visited With Patient and family together  Visit Type Initial  Referral From Patient  Consult/Referral To Chaplain  Spiritual Encounters  Spiritual Needs Prayer  Stress Factors  Patient Stress Factors Health changes  Family Stress Factors Family relationships

## 2021-06-21 NOTE — Assessment & Plan Note (Signed)
Patient appears euvolemic on her oral Lasix.  No significant peripheral edema and respiratory decompensation appears more related to COPD and potential pneumonia. -- Continue oral Lasix 40 mg daily -- Monitor volume status closely

## 2021-06-21 NOTE — Assessment & Plan Note (Signed)
Patient has long history of back pain and degenerative changes. --Pain control as needed --Tylenol, oxycodone, IV morphine for breakthrough pain

## 2021-06-21 NOTE — Progress Notes (Signed)
Progress Note   Patient: Misty Green UTM:[id number] DOB: 04/28/1970 DOA: 06/20/2021     1 DOS: the patient was seen and examined on 06/21/2021   Brief hospital course: 51 year old female past medical history of chronic respiratory failure secondary to COPD on 3 L/min O2 at baseline and trilogy when sleeping, hypertension, diastolic CHF, peripheral neuropathy, chronic pain syndrome, history of cardiac arrest, OSA, GERD, remote tobacco abuse, history of diverticulitis status post partial resection with colostomy presented to the ED on the evening of 06/20/2021 with worsening generalized weakness, cough and shortness of breath.  EMS was called and found her O2 sats 70s on her typical 3 L/min oxygen.  On arrival to the ER, patient required BiPAP and blood gas showed hypercapnia.  Chest x-ray was concerning for pneumonia and or CHF.  Admitted to the hospital and started on IV steroids and IV antibiotics.  Maintained on oral diuretic given less suspicion for decompensated CHF.  6/4: Patient reports 43 pounds weight loss in about 8 weeks due to daily nausea vomiting and not tolerating p.o. intake.  She wakes up diaphoretic and nauseous every morning.  She has abdominal pain as well and is tender on exam.  She reports history of hernia repair with mesh, colostomy due to diverticulitis in the past requiring partial colon resection.  Assessment and Plan: * Acute on chronic respiratory failure with hypoxemia (HCC) Baseline O2 requirement 3 L/min, uses trilogy NIV overnight and whenever sleeping at home.  Presented with progressive shortness of breath and increased oxygen requirements well above her baseline.  Required BiPAP in the ED. -- Treat COPD and pneumonia as outlined -- Supplemental O2 to maintain sats 90 to 94%, wean as tolerated -- BiPAP when sleeping and as needed  CAP (community acquired pneumonia) Presented with cough, worsening shortness of breath acute on chronic respiratory failure.  Chest  x-ray with constellation of findings that may reflect pulmonary edema with scattered areas of atelectasis versus atypical infection. --Continue Rocephin and Zithromax -- Mucinex -- Pulmonary hygiene -- Treat COPD as outlined  COPD exacerbation ([place]) Presented in respiratory distress requiring BiPAP.  Improving on IV steroids and antibiotics.  Chronic respiratory failure with hypoxia and hypercapnia -baseline O2 requirement 3 L/min, Trilogy NIV when sleeping.  On Trelegy, Daliresp at home. -- Continue IV Solu-Medrol -- Scheduled Atrovent nebs --As needed albuterol nebs -- Continue Daliresp -- Add Pulmicort nebs -- Consider pulmonology consult if slow to improve -- Pulmonology follow-up outpatient  History of diverticulitis Status post partial colon resection with colostomy.  Patient is having lower abdominal pain and tenderness on exam with daily nausea vomiting at home leading to unintentional weight loss.  Patient and daughter report her passing mucus from her rectum recently as well. Patient reports she was to be evaluated for colostomy takedown prior to the [place] pandemic.  High risk surgical candidate given her respiratory status. -- CT abdomen pelvis is pending  Nausea & vomiting Patient reports waking up daily diaphoretic and nauseous, very poor tolerance for oral intake leading to unintentional weight loss. -- CT abdomen pelvis pending -- As needed antiemetics  Abdominal tenderness Etiology to be determined.  History of diverticulitis status post colostomy.  History of inguinal hernia repair with mesh.  Patient complains of abdominal pain and daily nausea vomiting at home with unintentional weight loss.  Further work-up is warranted. -- CT abdomen pelvis pending  Elevated troponin Likely demand ischemia in the setting of acute on chronic respiratory failure.  Initial troponin 19 >> 55  but patient has no complaints of any chest pain. -- Stat EKG and repeat troponin if chest  pain develops --Repeat EKG (previous R poor quality with wandering baseline) -- Repeat troponin in the a.m. to ensure downtrend  History of repair of inguinal hernia Noted.  CT abdomen pelvis to evaluate abdominal tenderness and nausea vomiting  Unintentional weight loss 43 pound weight loss in about 8 weeks secondary to poor tolerance for any oral intake with daily nausea vomiting.  See abdominal tenderness, nausea and vomiting for management. -- Dietitian consult  Hypokalemia Presented with potassium 2.7, replaced. K today 3.7 -- Monitor BMP, replace K as needed -- Monitor Mg level as well  Acute on chronic diastolic CHF (congestive heart failure) ([place]) Patient appears euvolemic on her oral Lasix.  No significant peripheral edema and respiratory decompensation appears more related to COPD and potential pneumonia. -- Continue oral Lasix 40 mg daily -- Monitor volume status closely  Obesity Body mass index is 30.88 kg/m. With 43 pound unintentional weight loss in the past approximate 8 weeks related to abdominal pain and daily nausea vomiting.  OSA on CPAP Patient was initially on CPAP, now on trilogy.  Continue BiPAP nightly and as needed  Anxiety Resume home Wellbutrin, Lexapro, Seroquel  Chronic pain syndrome Pain control as needed        Subjective: Patient seen awake sitting up in bed on rounds today.  She reports breathing has improved dramatically.  Her primary concerns today are her abdominal pain and daily nausea vomiting.  She reports waking up every morning diaphoretic but not febrile and nauseous with vomiting.  Very poor tolerance of oral intake recently per daughter and husband.  They also report that she has been passing mucus from the rectum.  Are otherwise stool output in the colostomy has been very hard for which she will use laxatives or stool softeners and then has very high ostomy output requiring frequent bag changes which leads to irritation at the  stoma site.  Patient is agreeable to CT of abdomen and pelvis to evaluate.  Patient denies having any chest pain  Physical Exam: Vitals:   06/21/21 0831 06/21/21 1239 06/21/21 1300 06/21/21 1529  BP: 138/84 100/60  (!) 144/82  Pulse: 80 84  72  Resp: '19 19  18  '$ Temp: 97.9 F (36.6 C) 98 F (36.7 C)  (!) 97.3 F (36.3 C)  TempSrc:      SpO2: 95% 93% 97% 93%  Weight:      Height:       General exam: awake, alert, no acute distress HEENT: Wearing nasal cannula, moist mucus membranes, hearing grossly normal  Respiratory system: Very poor air movement with high-pitched expiratory wheezes throughout, normal respiratory effort at rest, on 3 L/min [place] O2. Cardiovascular system: normal S1/S2, RRR, no JVD, no peripheral edema.   Gastrointestinal system: Lower abdomen is tender on palpation with guarding, bowel sounds present Central nervous system: A&O x4. no gross focal neurologic deficits, normal speech Extremities: moves all, no edema, no cyanosis Skin: dry, intact, normal temperature Psychiatry: normal mood, congruent affect, judgement and insight appear normal   Data Reviewed:  Notable labs: Chloride 90, bicarb 39, glucose 140, calcium 8.6 with albumin 3.3, total protein 8.2, WBC improved 12.8.  Troponin 55  Family Communication: Daughter was on speaker phone on patient's cell during the encounter.  Husband arrived to bedside during encounter.  Disposition: Status is: Inpatient Remains inpatient appropriate because: Remains on IV antibiotics and further evaluation not appropriate  for outpatient setting   Planned Discharge Destination: Home    Time spent: 45 minutes  Author: Ezekiel Slocumb, DO 06/21/2021 4:51 PM  For on call review [email or web address].

## 2021-06-21 NOTE — Progress Notes (Signed)
PHARMACIST - PHYSICIAN COMMUNICATION  CONCERNING: Antibiotic IV to Oral Route Change Policy  RECOMMENDATION: This patient is receiving azithromycin by the intravenous route.  Based on criteria approved by the Pharmacy and Therapeutics Committee, the antibiotic(s) is/are being converted to the equivalent oral dose form(s).   DESCRIPTION: These criteria include: Patient being treated for a respiratory tract infection, urinary tract infection, cellulitis or clostridium difficile associated diarrhea if on metronidazole The patient is not neutropenic and does not exhibit a GI malabsorption state The patient is eating (either orally or via tube) and/or has been taking other orally administered medications for a least 24 hours The patient is improving clinically and has a Tmax < 100.5  If you have questions about this conversion, please contact the [place]  06/21/21

## 2021-06-21 NOTE — Progress Notes (Signed)
Assessed PT, PT stated that she has a home Trilogy unit. I then spoke with her husband.over the phone and was able to get her home unit settings. BIPAP was changed to AVAPS to mimic her home settings. Home settings are as follows; VT 400, Max P 37, EPAP 5/10, RR Auto, PS 6/15. Current V60 AVAPS settings are; VT 400, R 16, Max P 30/Min P 12, 40% FiO2. SpO2 96%. PT tolerating current settings, Neb Tx administered via in-line.

## 2021-06-21 NOTE — Assessment & Plan Note (Addendum)
Likely demand ischemia in the setting of acute on chronic respiratory failure.  Initial troponin 19 >> 55 but patient has no complaints of any chest pain. -- Stat EKG and repeat troponin if chest pain develops --Repeat EKG (previous R poor quality with wandering baseline) -- Repeat troponin in the a.m. to ensure downtrend

## 2021-06-21 NOTE — Assessment & Plan Note (Signed)
Presented with potassium 2.7, replaced. K today 4.0 -- Monitor BMP, replace K as needed -- Monitor Mg level as well

## 2021-06-21 NOTE — Assessment & Plan Note (Signed)
Presented in respiratory distress requiring BiPAP.  Improving on IV steroids and antibiotics.  Chronic respiratory failure with hypoxia and hypercapnia -baseline O2 requirement 3 L/min, Trilogy NIV when sleeping.  On Trelegy, Daliresp at home. -- Continue IV Solu-Medrol -- Scheduled Atrovent nebs --As needed albuterol nebs -- Continue Daliresp -- Add Pulmicort nebs -- Pulmonology consulted

## 2021-06-21 NOTE — Assessment & Plan Note (Signed)
43 pound weight loss in about 8 weeks secondary to poor tolerance for any oral intake with daily nausea vomiting.  See abdominal tenderness, nausea and vomiting for management. -- Dietitian consult --Further evaluation as outlined

## 2021-06-21 NOTE — Assessment & Plan Note (Signed)
Etiology to be determined.  History of diverticulitis status post colostomy.  History of inguinal hernia repair with mesh.  Patient complains of abdominal pain and daily nausea vomiting at home with unintentional weight loss.  Further work-up is warranted. -- CT abdomen pelvis pending

## 2021-06-21 NOTE — Assessment & Plan Note (Signed)
Body mass index is 30.88 kg/m. With 43 pound unintentional weight loss in the past approximate 8 weeks related to abdominal pain and daily nausea vomiting.

## 2021-06-21 NOTE — Assessment & Plan Note (Signed)
Resume home Wellbutrin, Lexapro, Seroquel

## 2021-06-21 NOTE — Assessment & Plan Note (Signed)
Presented with cough, worsening shortness of breath acute on chronic respiratory failure.  Chest x-ray with constellation of findings that may reflect pulmonary edema with scattered areas of atelectasis versus atypical infection. --Continue Rocephin and Zithromax -- Mucinex -- Pulmonary hygiene -- Treat COPD as outlined

## 2021-06-21 NOTE — Assessment & Plan Note (Signed)
Noted.  CT abdomen pelvis to evaluate abdominal tenderness and nausea vomiting

## 2021-06-21 NOTE — Assessment & Plan Note (Addendum)
Status post partial colon resection with colostomy.  Patient is having lower abdominal pain and tenderness on exam with daily nausea vomiting at home leading to unintentional weight loss.  Patient and daughter report her passing mucus from her rectum recently as well. Patient reports she was to be evaluated for colostomy takedown prior to the [place] pandemic.  High risk surgical candidate given her respiratory status. -- CT abdomen pelvis is pending

## 2021-06-22 LAB — BASIC METABOLIC PANEL
Anion gap: 11 (ref 5–15)
BUN: 19 mg/dL (ref 6–20)
CO2: 38 mmol/L — ABNORMAL HIGH (ref 22–32)
Calcium: 9.4 mg/dL (ref 8.9–10.3)
Chloride: 86 mmol/L — ABNORMAL LOW (ref 98–111)
Creatinine, Ser: 0.74 mg/dL (ref 0.44–1.00)
GFR, Estimated: >60 mL/min (ref >60)
Glucose, Bld: 117 mg/dL — ABNORMAL HIGH (ref 70–99)
Potassium: 3.6 mmol/L (ref 3.5–5.1)
Sodium: 135 mmol/L (ref 135–145)

## 2021-06-22 LAB — CBC
HCT: 39.3 % (ref 36.0–46.0)
Hemoglobin: 11.9 g/dL — ABNORMAL LOW (ref 12.0–15.0)
MCH: 28.7 pg (ref 26.0–34.0)
MCHC: 30.3 g/dL (ref 30.0–36.0)
MCV: 94.9 fL (ref 80.0–100.0)
Platelets: 347 10*3/uL (ref 150–400)
RBC: 4.14 MIL/uL (ref 3.87–5.11)
RDW: 13 % (ref 11.5–15.5)
WBC: 11.7 10*3/uL — ABNORMAL HIGH (ref 4.0–10.5)
nRBC: 0 % (ref 0.0–0.2)

## 2021-06-22 LAB — TROPONIN I (HIGH SENSITIVITY): Troponin I (High Sensitivity): 18 ng/L — ABNORMAL HIGH (ref <18)

## 2021-06-22 LAB — PROCALCITONIN: Procalcitonin: 0.24 ng/mL

## 2021-06-22 LAB — MAGNESIUM: Magnesium: 2.1 mg/dL (ref 1.7–2.4)

## 2021-06-22 LAB — PHOSPHORUS: Phosphorus: 4.2 mg/dL (ref 2.5–4.6)

## 2021-06-22 MED ORDER — DIPHENHYDRAMINE HCL 50 MG/ML IJ SOLN
12.5000 mg | Freq: Three times a day (TID) | INTRAMUSCULAR | Status: DC | PRN
  Administered 2021-06-22: 12.5 mg via INTRAVENOUS
  Filled 2021-06-22: qty 1

## 2021-06-22 MED ORDER — OXYCODONE HCL 5 MG PO TABS
5.0000 mg | ORAL_TABLET | ORAL | Status: DC | PRN
  Administered 2021-06-22: 5 mg via ORAL
  Filled 2021-06-22: qty 1

## 2021-06-22 MED ORDER — ACETAMINOPHEN 650 MG RE SUPP: 650.0000 mg | Freq: Three times a day (TID) | RECTAL | Status: DC

## 2021-06-22 MED ORDER — MAGNESIUM HYDROXIDE 400 MG/5ML PO SUSP
15.0000 mL | Freq: Once | ORAL | Status: AC
  Administered 2021-06-22: 15 mL via ORAL
  Filled 2021-06-22: qty 30

## 2021-06-22 MED ORDER — DOCUSATE SODIUM 100 MG PO CAPS
100.0000 mg | ORAL_CAPSULE | Freq: Two times a day (BID) | ORAL | Status: DC
  Administered 2021-06-22 – 2021-06-24 (×4): 100 mg via ORAL
  Filled 2021-06-22 (×4): qty 1

## 2021-06-22 MED ORDER — ALPRAZOLAM 0.25 MG PO TABS
0.2500 mg | ORAL_TABLET | Freq: Three times a day (TID) | ORAL | Status: DC | PRN
  Administered 2021-06-22 – 2021-06-24 (×5): 0.25 mg via ORAL
  Filled 2021-06-22 (×5): qty 1

## 2021-06-22 MED ORDER — POLYETHYLENE GLYCOL 3350 17 G PO PACK
17.0000 g | PACK | Freq: Two times a day (BID) | ORAL | Status: DC
  Administered 2021-06-22 – 2021-06-24 (×3): 17 g via ORAL
  Filled 2021-06-22 (×4): qty 1

## 2021-06-22 MED ORDER — ACETAMINOPHEN 500 MG PO TABS
1000.0000 mg | ORAL_TABLET | Freq: Three times a day (TID) | ORAL | Status: DC
  Administered 2021-06-22 – 2021-06-24 (×4): 1000 mg via ORAL
  Filled 2021-06-22 (×5): qty 2

## 2021-06-22 MED ORDER — MORPHINE SULFATE (PF) 2 MG/ML IV SOLN
2.0000 mg | INTRAVENOUS | Status: DC | PRN
  Administered 2021-06-22 – 2021-06-24 (×11): 2 mg via INTRAVENOUS
  Filled 2021-06-22 (×11): qty 1

## 2021-06-22 MED ORDER — HYDROCODONE-ACETAMINOPHEN 5-325 MG PO TABS
1.0000 | ORAL_TABLET | Freq: Four times a day (QID) | ORAL | Status: DC | PRN
  Administered 2021-06-22 – 2021-06-24 (×4): 2 via ORAL
  Filled 2021-06-22 (×4): qty 2

## 2021-06-22 NOTE — Consult Note (Addendum)
PULMONOLOGY         Date: 06/22/2021,   MRN# [id number] Misty Green 02/16/1970     AdmissionWeight: 81.6 kg                 CurrentWeight: 81.6 kg  Referring provider: Dr Arbutus Ped   CHIEF COMPLAINT:   Abnormal CT chest with findings to suggest primary bronchogenic carcinoma   HISTORY OF PRESENT ILLNESS   51 year old female with a history of CHF, current smoker advanced COPD GERD, essential hypertension and chronic hypoxemia with OSA overlap, previous cardiac arrest, uses 3 L/min on outpatient came in with acute on chronic hypoxemia required BiPAP initially chest x-ray was abnormal with findings of possible pneumonia and primary bronchogenic carcinoma.  Reviewed CT chest independently with findings of chronic bronchitic changes bilaterally, significant motion artifact tree-in-bud opacification bronchiectatic changes worse on the right lower lobe.    PAST MEDICAL HISTORY   Past Medical History:  Diagnosis Date   Cardiac arrest ([place]) 05/12/2018   CHF (congestive heart failure) (HCC)    Chicken pox    COPD (chronic obstructive pulmonary disease) (HCC)    Current smoker    Dyspnea    GERD (gastroesophageal reflux disease)    Hypertension    Malfunction of colostomy and enterostomy (HCC)    Opiate abuse, continuous ([place])    Oxygen deficit    Sleep apnea      SURGICAL HISTORY   Past Surgical History:  Procedure Laterality Date   ABDOMINAL HYSTERECTOMY     BACK SURGERY     cesction     COLOSTOMY  01/13/2018   Procedure: COLOSTOMY Creation;  Surgeon: Jules Husbands, MD;  Location: [facility] ORS;  Service: General;;   HERNIA REPAIR     INCISIONAL HERNIA REPAIR     lower midline laparotomy incision (hysterectomy), repaired with mesh   IR FLUORO GUIDE CV LINE RIGHT  05/26/2018   IR REMOVAL TUN CV CATH W/O FL  06/02/2018   IR US GUIDE VASC ACCESS RIGHT  05/26/2018   LAPAROSCOPIC CHOLECYSTECTOMY  2012   LAPAROSCOPIC LYSIS OF ADHESIONS  01/13/2018   Procedure:  LAPAROSCOPIC LYSIS OF ADHESIONS;  Surgeon: Jules Husbands, MD;  Location: [facility] ORS;  Service: General;;   LAPAROSCOPIC SIGMOID COLECTOMY  01/13/2018   Procedure: LAPAROSCOPIC SIGMOID COLECTOMY;  Surgeon: Jules Husbands, MD;  Location: [facility] ORS;  Service: General;;   LUMBAR SPINE SURGERY     ORIF WRIST FRACTURE Right 08/25/2015   Procedure: OPEN REDUCTION INTERNAL FIXATION (ORIF) WRIST FRACTURE;  Surgeon: Corky Mull, MD;  Location: [facility] ORS;  Service: Orthopedics;  Laterality: Right;   OVARIAN CYST REMOVAL       FAMILY HISTORY   Family History  Problem Relation Age of Onset   Diabetes Mother    COPD Mother    Hypertension Father    Diabetes Father    Heart disease Father    Hyperlipidemia Father    Prostate cancer Father      SOCIAL HISTORY   Social History   Tobacco Use   Smoking status: Former    Packs/day: 1.00    Years: 15.00    Pack years: 15.00    Types: Cigarettes   Smokeless tobacco: Never   Tobacco comments:    4 cigarettes a day   Vaping Use   Vaping Use: Former   Substances: Nicotine  Substance Use Topics   Alcohol use: Yes    Comment: rarely   Drug use: Yes  Types: Marijuana    Comment: not everyday      MEDICATIONS    Home Medication:    Current Medication:  Current Facility-Administered Medications:    acetaminophen (TYLENOL) tablet 650 mg, 650 mg, Oral, Q6H PRN **OR** acetaminophen (TYLENOL) suppository 650 mg, 650 mg, Rectal, Q6H PRN, Jonelle Sidle, Mohammad L, MD   albuterol (PROVENTIL) (2.5 MG/3ML) 0.083% nebulizer solution 2.5 mg, 2.5 mg, Nebulization, Q6H, Garba, Mohammad L, MD, 2.5 mg at 06/22/21 0759   azithromycin (ZITHROMAX) tablet 500 mg, 500 mg, Oral, Daily, Benita Gutter, RPH, 500 mg at 06/21/21 2059   budesonide (PULMICORT) nebulizer solution 0.25 mg, 0.25 mg, Nebulization, BID, Nicole Kindred A, DO, 0.25 mg at 06/22/21 0759   buPROPion (WELLBUTRIN) tablet 75 mg, 75 mg, Oral, BID, Nicole Kindred A, DO, 75 mg at 06/22/21 0905    cefTRIAXone (ROCEPHIN) 2 g in sodium chloride 0.9 % 100 mL IVPB, 2 g, Intravenous, Q24H, Harvest Dark, MD, Last Rate: 200 mL/hr at 06/21/21 1958, 2 g at 06/21/21 1958   enoxaparin (LOVENOX) injection 40 mg, 40 mg, Subcutaneous, Q24H, Garba, Mohammad L, MD, 40 mg at 06/22/21 0905   escitalopram (LEXAPRO) tablet 20 mg, 20 mg, Oral, Daily, Nicole Kindred A, DO, 20 mg at 06/22/21 1610   furosemide (LASIX) tablet 40 mg, 40 mg, Oral, Daily, Jonelle Sidle, Mohammad L, MD, 40 mg at 06/22/21 0905   gabapentin (NEURONTIN) capsule 400 mg, 400 mg, Oral, TID, Nicole Kindred A, DO, 400 mg at 06/22/21 0905   guaiFENesin (MUCINEX) 12 hr tablet 600 mg, 600 mg, Oral, BID, Jonelle Sidle, Mohammad L, MD, 600 mg at 06/22/21 9604   HYDROcodone-acetaminophen (NORCO/VICODIN) 5-325 MG per tablet 1-2 tablet, 1-2 tablet, Oral, Q6H PRN, Nicole Kindred A, DO   ipratropium (ATROVENT) nebulizer solution 0.5 mg, 0.5 mg, Nebulization, Q6H, Jonelle Sidle, Mohammad L, MD, 0.5 mg at 06/22/21 0759   lip balm (BLISTEX) ointment, , Topical, PRN, Madueme, Elvira C, RPH   methylPREDNISolone sodium succinate (SOLU-MEDROL) 40 mg/mL injection 40 mg, 40 mg, Intravenous, Q12H, Garba, Mohammad L, MD, 40 mg at 06/22/21 0100   metoprolol tartrate (LOPRESSOR) injection 5 mg, 5 mg, Intravenous, Q6H PRN, Jonelle Sidle, Mohammad L, MD   morphine (PF) 2 MG/ML injection 2 mg, 2 mg, Intravenous, Q2H PRN, Jonelle Sidle, Mohammad L, MD, 2 mg at 06/22/21 1015   ondansetron (ZOFRAN) tablet 4 mg, 4 mg, Oral, Q6H PRN **OR** ondansetron (ZOFRAN) injection 4 mg, 4 mg, Intravenous, Q6H PRN, Jonelle Sidle, Mohammad L, MD, 4 mg at 06/21/21 1851   pantoprazole (PROTONIX) EC tablet 40 mg, 40 mg, Oral, BID, Nicole Kindred A, DO, 40 mg at 06/22/21 0905   polyethylene glycol (MIRALAX / GLYCOLAX) packet 17 g, 17 g, Oral, Daily, Nicole Kindred A, DO, 17 g at 06/22/21 0904   potassium chloride 10 mEq in 100 mL IVPB, 10 mEq, Intravenous, Once, Garba, Mohammad L, MD   QUEtiapine (SEROQUEL) tablet 25 mg, 25 mg,  Oral, QHS, Griffith, Kelly A, DO, 25 mg at 06/21/21 2100   roflumilast (DALIRESP) tablet 500 mcg, 500 mcg, Oral, Daily, Nicole Kindred A, DO, 500 mcg at 06/22/21 5409    ALLERGIES   Other     REVIEW OF SYSTEMS    Review of Systems:  Gen:  Denies  fever, sweats, chills weigh loss  HEENT: Denies blurred vision, double vision, ear pain, eye pain, hearing loss, nose bleeds, sore throat Cardiac:  No dizziness, chest pain or heaviness, chest tightness,edema Resp:   reports dyspnea chronically  Gi: Denies swallowing difficulty, stomach pain, nausea  or vomiting, diarrhea, constipation, bowel incontinence Gu:  Denies bladder incontinence, burning urine Ext:   Denies Joint pain, stiffness or swelling Skin: Denies  skin rash, easy bruising or bleeding or hives Endoc:  Denies polyuria, polydipsia , polyphagia or weight change Psych:   Denies depression, insomnia or hallucinations   Other:  All other systems negative   VS: BP 129/85 (BP Location: Right Arm)   Pulse 80   Temp 97.8 F (36.6 C) (Oral)   Resp 16   Ht '5\' 4"'$  (1.626 m)   Wt 81.6 kg   SpO2 93%   BMI 30.88 kg/m      PHYSICAL EXAM    GENERAL:NAD, no fevers, chills, no weakness no fatigue HEAD: Normocephalic, atraumatic.  EYES: Pupils equal, round, reactive to light. Extraocular muscles intact. No scleral icterus.  MOUTH: Moist mucosal membrane. Dentition intact. No abscess noted.  EAR, NOSE, THROAT: Clear without exudates. No external lesions.  NECK: Supple. No thyromegaly. No nodules. No JVD.  PULMONARY: decreased breath sounds with mild rhonchi worse at bases bilaterally.  CARDIOVASCULAR: S1 and S2. Regular rate and rhythm. No murmurs, rubs, or gallops. No edema. Pedal pulses 2+ bilaterally.  GASTROINTESTINAL: Soft, nontender, nondistended. No masses. Positive bowel sounds. No hepatosplenomegaly.  MUSCULOSKELETAL: No swelling, clubbing, or edema. Range of motion full in all extremities.  NEUROLOGIC: Cranial  nerves II through XII are intact. No gross focal neurological deficits. Sensation intact. Reflexes intact.  SKIN: No ulceration, lesions, rashes, or cyanosis. Skin warm and dry. Turgor intact.  PSYCHIATRIC: Mood, affect within normal limits. The patient is awake, alert and oriented x 3. Insight, judgment intact.       IMAGING      ASSESSMENT/PLAN   Acute on chronic hypoxemic respiratory failure    -Reviewed CT chest with findings of chronic bronchitic changes and bilateral bronchiectasis and tree-in-bud opacification.  I did not see changes that are consistent with a bronchogenic carcinoma and rather mucoid impaction is seen bilaterally.  I would treat with antibiotics for now as well as mucolytic's and have outpatient work-up with repeat imaging in 6 to 8 weeks after steroids and antibiotics -Patient should have immunologic work-up as this can be seen in  hypogammaglobulinemia and other autoimmune diseases such as Crohn's/UC -Rarely mycobacterial infection chronically can appear this way as well -I agree with Zithromax and Rocephin empirically for CAP coverage and Solu-Medrol 40 mg every 12 -Mucinex 600 twice daily -There is absence of interstitial pulmonary edema -Bronchopulmonary hygiene with incentive spirometry and flutter valve -Microbiology work-up with respiratory cultures, MRSA PCR, procalcitonin trend, Fungitell and Aspergillus antibody -Patient may require airway inspection and bronchoalveolar lavage via bronchoscopy on outpatient.  -she quit smoking 12 months ago      Pulmonary pre-operative evaluation    - patient is low risk for EGD/Colonoscopy and is able to have anesthesia    Thank you for allowing me to participate in the care of this patient.   Patient/Family are satisfied with care plan and all questions have been answered.    Provider disclosure: Patient with at least one acute or chronic illness or injury that poses a threat to life or bodily function and  is being managed actively during this encounter.  All of the below services have been performed independently by signing provider:  review of prior documentation from internal and or external health records.  Review of previous and current lab results.  Interview and comprehensive assessment during patient visit today. Review of current and previous chest radiographs/CT  scans. Discussion of management and test interpretation with health care team and patient/family.   This document was prepared using Dragon voice recognition software and may include unintentional dictation errors.     Ottie Glazier, M.D.  Division of Pulmonary & Critical Care Medicine

## 2021-06-22 NOTE — Consult Note (Addendum)
Inpatient Consultation  Referring Provider:     Dr Arbutus Ped Admit date 06/20/21 Consult date     06/22/21    Reason for Consultation:     dyspepsia/weight loss         HPI:   Misty Green is a 51 y.o. female medical history significant of COPD, remote tobacco abuse, hypertension, diastolic CHF, peripheral neuropathy, chronic pain syndrome, history of cardiac arrest, obstructive sleep apnea, GERD , who was admitted with acute on chronic respiratory failure/hypoxia/chf and copd exacerbation.  Pulmonology has been consulted (appreciate consult) for help and GI has been consulted due to recent significant weight loss/dyspepsia.  Patient reports she has had parastomal abdominal pain since her colostomy placed in 2703 for complicated diverticulitis. States she also has chronic problems with consitpation and alternating diarrhea but this is not new. States she was to have her colostomy reversed but but pandemic concerns prevented this.  Note she was seen at [facility] 04/18/20 for ongoing abdominal pain/dyspepsia/abnormal CT of Hartmann pouch showing a 55m rounded nodule- egd/colonoscopy were arranged/scheduled, however patient rescheduled a number of times and ultimately did not have them done. States she has lost 43lb over the last 8-10w and has been having early satiety and nausea when she eats- there is morning vomiting some times. Nothing tastes right; endorses additional luq pain and says she has had frequent intermittnet black stools. sees some bright red bleeding with her diarrheal stools. Takes miralax daily. Denies using pepto. States she used to use frequent BC powders and nsaids but her last BC powder use was 2w ago. Pt does report twice daily pantoprazole use. Denies further GI related concerns.Denies any family history of  gi malignancy. She has minimal anemia, normal kidney/liver enzymes Had negative h pylori test 3/22 and a history of narcotics for chronic back pain in past. Note weight at her  04/18/20 visit was 260 admit weight now was 180  PREVIOUS ENDOSCOPIES:    Partial colectomy 01/13/18- sigmoid biopsy with diverticulosis, hyperplastic polyp, fibrous serosal adhesion, no malignancy          Colonoscopy with Dr SGustavo Lah2008- adenomatous and hyperplastic polyps removed  CCY 2002   Past Medical History:  Diagnosis Date   [place]12[place]   CHF (congestive heart failure) (HCC)    Chicken pox    COPD (chronic obstructive pulmonary disease) (HCC)    Current smoker    Dyspnea    GERD (gastroesophageal reflux disease)    Hypertension    Malfunction of colostomy and enterostomy ([place]    Opiate abuse, continuous ([place]    Oxygen deficit    Sleep apnea     Past Surgical History:  Procedure Laterality Date   ABDOMINAL HYSTERECTOMY     BACK SURGERY     cesction     COLOSTOMY  01/13/2018   Procedure: COLOSTOMY Creation;  Surgeon: PJules Husbands MD;  Location: [facility] ORS;  Service: General;;   HERNIA REPAIR     INCISIONAL HERNIA REPAIR     lower midline laparotomy incision (hysterectomy), repaired with mesh   IR FLUORO GUIDE CV LINE RIGHT  05/26/2018   IR REMOVAL TUN CV CATH W/O FL  06/02/2018   IR [place] VASC ACCESS RIGHT  05/26/2018   LAPAROSCOPIC CHOLECYSTECTOMY  2012   LAPAROSCOPIC LYSIS OF ADHESIONS  01/13/2018   Procedure: LAPAROSCOPIC LYSIS OF ADHESIONS;  Surgeon: PJules Husbands MD;  Location: [facility] ORS;  Service: General;;   LAPAROSCOPIC SIGMOID COLECTOMY  01/13/2018  Procedure: LAPAROSCOPIC SIGMOID COLECTOMY;  Surgeon: Jules Husbands, MD;  Location: [facility] ORS;  Service: General;;   LUMBAR SPINE SURGERY     ORIF WRIST FRACTURE Right 08/25/2015   Procedure: OPEN REDUCTION INTERNAL FIXATION (ORIF) WRIST FRACTURE;  Surgeon: Corky Mull, MD;  Location: [facility] ORS;  Service: Orthopedics;  Laterality: Right;   OVARIAN CYST REMOVAL      Family History  Problem Relation Age of Onset   Diabetes Mother    COPD Mother    Hypertension Father     Diabetes Father    Heart disease Father    Hyperlipidemia Father    Prostate cancer Father      Social History   Tobacco Use   Smoking status: Former    Packs/day: 1.00    Years: 15.00    Pack years: 15.00    Types: Cigarettes   Smokeless tobacco: Never   Tobacco comments:    4 cigarettes a day   Vaping Use   Vaping Use: Former   Substances: Nicotine  Substance Use Topics   Alcohol use: Yes    Comment: rarely   Drug use: Yes    Types: Marijuana    Comment: not everyday     Prior to Admission medications   Medication Sig Start Date End Date Taking? Authorizing Provider  acetaminophen (TYLENOL) 325 MG tablet Take 1-2 tablets (325-650 mg total) by mouth every 4 (four) hours as needed for mild pain. Patient taking differently: Take 650 mg by mouth 3 (three) times daily. 06/05/18  Yes Love, Ivan Anchors, PA-C  budesonide (PULMICORT) 0.5 MG/2ML nebulizer solution Take 2 mLs (0.5 mg total) by nebulization 2 (two) times daily. 02/11/20  Yes Eugenie Filler, MD  buPROPion (WELLBUTRIN) 75 MG tablet Take 75 mg by mouth 2 (two) times daily.   Yes [provider]  DALIRESP 500 MCG TABS tablet TAKE 1 TABLET BY MOUTH DAILY Patient taking differently: Take 500 mcg by mouth daily. 08/27/19  Yes Lavera Guise, MD  escitalopram (LEXAPRO) 20 MG tablet TAKE 1 TABLET BY MOUTH EVERY DAY Patient taking differently: Take 20 mg by mouth daily. 07/20/19  Yes Boscia, Greer Ee, NP  Fluticasone-Umeclidin-Vilant (TRELEGY ELLIPTA) 100-62.5-25 MCG/INH AEPB Inhale 1 each into the lungs daily. 07/19/19  Yes Scarboro, Audie Clear, NP  furosemide (LASIX) 40 MG tablet Take 40 mg by mouth daily. 01/20/20  Yes [provider]  gabapentin (NEURONTIN) 400 MG capsule Take 1 capsule (400 mg total) by mouth 3 (three) times daily. 07/12/19  Yes Mercy Riding, MD  Hyoscyamine Sulfate SL 0.125 MG SUBL Place under the tongue every 6 (six) hours as needed.   Yes [provider]  Melatonin 10 MG TABS Take 30 mg by  mouth at bedtime.   Yes [provider]  ondansetron (ZOFRAN) 4 MG tablet Take 4 mg by mouth every 8 (eight) hours as needed for nausea or vomiting.   Yes [provider]  OZEMPIC, 0.25 OR 0.5 MG/DOSE, 2 MG/1.5ML SOPN Inject 0.25 mg into the skin once a week. 10/02/19  Yes [provider]  pantoprazole (PROTONIX) 40 MG tablet Take 40 mg by mouth 2 (two) times daily. 11/11/19  Yes [provider]  polyethylene glycol (MIRALAX / GLYCOLAX) 17 g packet Take 17 g by mouth daily. 02/12/20  Yes Eugenie Filler, MD  PRESCRIPTION MEDICATION Inhale into the lungs at bedtime. BiPap   Yes [provider]  QUEtiapine (SEROQUEL) 25 MG tablet Take 1 tablet (25 mg  total) by mouth at bedtime. 02/11/20  Yes Eugenie Filler, MD  albuterol (PROVENTIL HFA;VENTOLIN HFA) 108 (90 Base) MCG/ACT inhaler Inhale 2 puffs into the lungs every 6 (six) hours as needed for wheezing or shortness of breath.     [provider]  albuterol (PROVENTIL) (2.5 MG/3ML) 0.083% nebulizer solution Take 2.5 mg by nebulization every 6 (six) hours as needed for wheezing or shortness of breath. Inhale one vial via nebulizer 4 times a day as directed. Patient not taking: Reported on 06/20/2021    [provider]  amLODipine (NORVASC) 10 MG tablet Take 10 mg by mouth daily. Patient not taking: Reported on 06/20/2021    [provider]  feeding supplement (ENSURE ENLIVE / ENSURE PLUS) LIQD Take 237 mLs by mouth 3 (three) times daily between meals. Patient not taking: Reported on 06/20/2021 02/11/20   Eugenie Filler, MD  fluticasone [facility]) 50 MCG/ACT nasal spray Place 1 spray into both nostrils at bedtime. Patient not taking: Reported on 06/20/2021 02/11/20   Eugenie Filler, MD  guaiFENesin-codeine 100-10 MG/5ML syrup Take 10 mLs by mouth every 6 (six) hours as needed for cough. Patient not taking: Reported on 06/20/2021 02/11/20   Eugenie Filler, MD  hydrOXYzine  (ATARAX/VISTARIL) 25 MG tablet Take 1 tablet (25 mg total) by mouth 3 (three) times daily. Patient not taking: Reported on 06/20/2021 02/11/20   Eugenie Filler, MD  ipratropium-albuterol (DUONEB) 0.5-2.5 (3) MG/3ML SOLN Take 3 mLs by nebulization 3 (three) times daily. Patient not taking: Reported on 06/20/2021 02/11/20   Eugenie Filler, MD  montelukast (SINGULAIR) 10 MG tablet TAKE 1 TABLET BY MOUTH EVERYDAY AT BEDTIME Patient not taking: Reported on 06/20/2021 07/30/19   Kendell Bane, NP  Multiple Vitamins-Minerals (MULTIVITAMIN WITH MINERALS) tablet Take 1 tablet by mouth at bedtime.  Patient not taking: Reported on 06/20/2021    [provider]  nicotine (NICODERM CQ - DOSED IN MG/24 HOURS) 14 mg/24hr patch Place 1 patch (14 mg total) onto the skin daily as needed (for smoking cessation). Patient not taking: Reported on 06/20/2021 02/11/20   Eugenie Filler, MD  promethazine (PHENERGAN) 25 MG tablet Take 25 mg by mouth every 8 (eight) hours as needed for nausea or vomiting. Patient not taking: Reported on 06/20/2021    [provider]  senna-docusate (SENOKOT-S) 8.6-50 MG tablet Take 1 tablet by mouth at bedtime as needed for mild constipation. Patient not taking: Reported on 06/20/2021 02/11/20   Eugenie Filler, MD  Vitamin D, Ergocalciferol, (DRISDOL) 1.25 MG (50000 UNIT) CAPS capsule Take 50,000 Units by mouth once a week. Patient not taking: Reported on 06/20/2021 11/13/19   [provider]    Current Facility-Administered Medications  Medication Dose Route Frequency Provider Last Rate Last Admin   acetaminophen (TYLENOL) tablet 1,000 mg  1,000 mg Oral Q8H Griffith, Kelly A, DO   1,000 mg at 06/22/21 1356   Or   acetaminophen (TYLENOL) suppository 650 mg  650 mg Rectal Q8H Nicole Kindred A, DO       albuterol (PROVENTIL) (2.5 MG/3ML) 0.083% nebulizer solution 2.5 mg  2.5 mg Nebulization Q6H Garba, Mohammad L, MD   2.5 mg at 06/22/21 0759   azithromycin  (ZITHROMAX) tablet 500 mg  500 mg Oral Daily Benita Gutter, RPH   500 mg at 06/21/21 2059   budesonide (PULMICORT) nebulizer solution 0.25 mg  0.25 mg Nebulization BID Nicole Kindred A, DO   0.25 mg at 06/22/21 [phone]  buPROPion [facility]) tablet 75 mg  75 mg Oral BID Nicole Kindred A, DO   75 mg at 06/22/21 0905   cefTRIAXone (ROCEPHIN) 2 g in sodium chloride 0.9 % 100 mL IVPB  2 g Intravenous Q24H Harvest Dark, MD 200 mL/hr at 06/21/21 1958 2 g at 06/21/21 1958   enoxaparin (LOVENOX) injection 40 mg  40 mg Subcutaneous Q24H Gala Romney L, MD   40 mg at 06/22/21 0905   escitalopram (LEXAPRO) tablet 20 mg  20 mg Oral Daily Nicole Kindred A, DO   20 mg at 06/22/21 2992   furosemide (LASIX) tablet 40 mg  40 mg Oral Daily Elwyn Reach, MD   40 mg at 06/22/21 4268   gabapentin (NEURONTIN) capsule 400 mg  400 mg Oral TID Nicole Kindred A, DO   400 mg at 06/22/21 0905   guaiFENesin (MUCINEX) 12 hr tablet 600 mg  600 mg Oral BID Elwyn Reach, MD   600 mg at 06/22/21 0905   HYDROcodone-acetaminophen (NORCO/VICODIN) 5-325 MG per tablet 1-2 tablet  1-2 tablet Oral Q6H PRN Nicole Kindred A, DO       ipratropium (ATROVENT) nebulizer solution 0.5 mg  0.5 mg Nebulization Q6H Gala Romney L, MD   0.5 mg at 06/22/21 0759   lip balm (BLISTEX) ointment   Topical PRN Madueme, Elvira C, RPH       methylPREDNISolone sodium succinate (SOLU-MEDROL) 40 mg/mL injection 40 mg  40 mg Intravenous Q12H Gala Romney L, MD   40 mg at 06/22/21 1356   metoprolol tartrate (LOPRESSOR) injection 5 mg  5 mg Intravenous Q6H PRN Elwyn Reach, MD       morphine (PF) 2 MG/ML injection 2 mg  2 mg Intravenous Q2H PRN Nicole Kindred A, DO       ondansetron (ZOFRAN) tablet 4 mg  4 mg Oral Q6H PRN Elwyn Reach, MD       Or   ondansetron (ZOFRAN) injection 4 mg  4 mg Intravenous Q6H PRN Gala Romney L, MD   4 mg at 06/22/21 1355   oxyCODONE (Oxy IR/ROXICODONE) immediate release tablet 5-10 mg   5-10 mg Oral Q4H PRN Nicole Kindred A, DO   5 mg at 06/22/21 1359   pantoprazole (PROTONIX) EC tablet 40 mg  40 mg Oral BID Nicole Kindred A, DO   40 mg at 06/22/21 0905   polyethylene glycol (MIRALAX / GLYCOLAX) packet 17 g  17 g Oral Daily Nicole Kindred A, DO   17 g at 06/22/21 0904   potassium chloride 10 mEq in 100 mL IVPB  10 mEq Intravenous Once Elwyn Reach, MD       QUEtiapine (SEROQUEL) tablet 25 mg  25 mg Oral QHS Nicole Kindred A, DO   25 mg at 06/21/21 2100   roflumilast (DALIRESP) tablet 500 mcg  500 mcg Oral Daily Nicole Kindred A, DO   500 mcg at 06/22/21 3419    Allergies as of 06/20/2021 - Review Complete 06/20/2021  Allergen Reaction Noted   Other Nausea And Vomiting 03/21/2016     Review of Systems:    All systems reviewed and negative except where noted in HPI.  Gen: Denies any fever, chills, sweats, anorexia, fatigue, weakness, malaise, and sleep disorder CV: Denies chest pain, angina, palpitations, syncope, orthopnea, PND, peripheral edema, and claudication. Resp: Denies dyspnea at rest, cough, sputum, wheezing, hemoptysis, and pleurisy. GI: Denies vomiting blood, jaundice, and fecal incontinence.   Denies dysphagia or odynophagia. Decreased ostomy output  with pain around ostomy site. GU : Denies urinary burning, blood in urine, urinary frequency, urinary hesitancy, nocturnal urination, and urinary incontinence. MS: Denies joint pain, limitation of movement, and swelling, stiffness, low back pain, extremity pain. Denies muscle weakness, cramps, atrophy.  Derm: Denies rash, itching, dry skin, hives, moles, warts, or unhealing ulcers.  Psych: Denies depression, anxiety, memory loss, suicidal ideation, hallucinations, paranoia, and confusion. Heme: Denies bruising, bleeding, and enlarged lymph nodes. Neuro:  Denies any headaches, dizziness, paresthesias. Endo:  Denies any problems with DM, thyroid, adrenal function.    Physical Exam:  Vital signs in last  24 hours: Temp:  [96 F (35.6 C)-97.9 F (36.6 C)] 97.8 F (36.6 C) (06/05 1158) Pulse Rate:  [71-89] 80 (06/05 1158) Resp:  [16-18] 16 (06/05 1158) BP: (119-162)/(80-147) 129/85 (06/05 1158) SpO2:  [89 %-98 %] 93 % (06/05 1158) FiO2 (%):  [36 %] 36 % (06/05 0202) Last BM Date : 06/21/21 General:   Pleasant woman in NAD Head:  Normocephalic and atraumatic. Eyes:   No icterus.   Conjunctiva pink. Ears:  Normal auditory acuity. Mouth: Mucosa pink moist, no lesions. Neck:  Supple; no masses felt Lungs:  Respirations even and unlabored. Lungs clear, but diminished to auscultation bilaterally.   No wheezes, crackles, or rhonchi. Wears 3L 02 via [place] chronically Heart:  S1S2, RRR, no MRG. No edema. Abdomen:   soft, nondistended, Normal bowel sounds. No appreciable masses or hepatomegaly. No rebound signs or other peritoneal signs. Ostomy present in LLQ, tender around. No signs of fistula. Ostomy is pink and viable. Little to no stool in bag Rectal:  Not performed.  Msk:  MAEW x4, No clubbing or cyanosis. Strength 5/5. Symmetrical without gross deformities. Neurologic:  Alert and  oriented x4;  Cranial nerves II-XII intact.  Skin:  Warm, dry, pink without significant lesions or rashes. Psych:  Alert and cooperative. Normal affect.  LAB RESULTS: Recent Labs    06/20/21 1756 06/21/21 0117 06/22/21 0612  WBC 19.2* 12.8* 11.7*  HGB 12.5 12.4 11.9*  HCT 41.6 41.4 39.3  PLT 301 317 347   BMET Recent Labs    06/20/21 1756 06/21/21 0117 06/22/21 0612  NA 138 142 135  K 2.7* 3.7 3.6  CL 86* 90* 86*  CO2 41* 39* 38*  GLUCOSE 117* 140* 117*  BUN '7 9 19  '$ CREATININE 0.74 0.75  0.68 0.74  CALCIUM 8.6* 8.6* 9.4   LFT Recent Labs    06/21/21 0117  PROT 8.2*  ALBUMIN 3.3*  AST 17  ALT 13  ALKPHOS 96  BILITOT 0.7   PT/INR No results for input(s): LABPROT, INR in the last 72 hours.  STUDIES: CT CHEST ABDOMEN PELVIS W CONTRAST  Result Date: 06/21/2021 CLINICAL DATA:   Unintended weight loss. Daily nausea and vomiting leading to weight loss. EXAM: CT CHEST, ABDOMEN, AND PELVIS WITH CONTRAST TECHNIQUE: Multidetector CT imaging of the chest, abdomen and pelvis was performed following the standard protocol during bolus administration of intravenous contrast. RADIATION DOSE REDUCTION: This exam was performed according to the departmental dose-optimization program which includes automated exposure control, adjustment of the mA and/or kV according to patient size and/or use of iterative reconstruction technique. CONTRAST:  142m OMNIPAQUE IOHEXOL 300 MG/ML  SOLN COMPARISON:  Abdominopelvic CT 10/05/2020. Chest radiograph yesterday. Chest CT 06/05/2018 FINDINGS: CT CHEST FINDINGS Cardiovascular: Mild aortic atherosclerosis. There are coronary artery calcifications. The heart is normal in size. Exam not tailored to pulmonary artery assessment, no pulmonary embolus. No pericardial effusion. Mediastinum/Nodes:  No enlarged mediastinal or hilar lymph nodes. No esophageal wall thickening. No thyroid nodule. Lungs/Pleura: Mild apical predominant emphysema. There are multiple calcified nodules consistent with prior granulomatous disease. Mild bronchial wall thickening. There are scattered ill-defined opacities, some of which are tree-in-bud within the lower lobes and right middle lobe, many of which are small, ill-defined and nodular in appearance. There is a basilar predominant distribution. No pleural fluid. Musculoskeletal: No focal bone lesion. No acute osseous finding. No obvious breast mass by CT. CT ABDOMEN PELVIS FINDINGS Hepatobiliary: No suspicious liver lesion. Occasional tiny subcentimeter hepatic hypodensities are too small to characterize and likely small cysts or hemangiomas. Cholecystectomy without biliary dilatation. Pancreas: No ductal dilatation or inflammation. Spleen: Normal in size without focal abnormality. Adrenals/Urinary Tract: No adrenal nodule. Punctate nonobstructing  stone in the lower pole of the left kidney. No hydronephrosis. 12 mm low-density lesion in the lower right kidney, series 2, image 77, stable in size from 2019 exam and likely minimally complex cyst. Additional tiny renal hypodensities are too small to characterize. No dedicated further follow-up is recommended. The urinary bladder is empty. Stomach/Bowel: Stomach is distended with enteric contrast. There is no gastric wall thickening. Administered enteric contrast reaches the mid-distal small bowel. No small bowel obstruction or inflammation. Low lying cecum in the pelvic midline. The appendix is not confidently visualized. Multifocal colonic diverticulosis. No diverticulitis or acute colonic inflammation. Descending colostomy. Parastomal hernia contains nonobstructed loop of small bowel as well as portion of descending colon. Stapled off sigmoid colon in the pelvis. Vascular/Lymphatic: Aortic atherosclerosis. No aortic aneurysm. Portal vein is patent. No abdominopelvic adenopathy. Reproductive: Hysterectomy.  No adnexal mass. Other: Postsurgical change of the anterior abdominal wall with rectus diastasis. No ascites, free air or abdominopelvic collection. Musculoskeletal: Interbody spacers at L4-L5 and L5-S1. No acute osseous findings. Occasional bone islands. IMPRESSION: 1. Scattered ill-defined pulmonary opacities, some of which are tree-in-bud within the lower lobes and right middle lobe. Many of these opacities are small, ill-defined and nodular in appearance. Favor infection/inflammation, including aspiration in the setting of repeated vomiting. 2. Findings suspicious for malignancy. 3. Mild emphysema and bronchial thickening. 4. Nonobstructing left nephrolithiasis. 5. Descending colostomy with peristomal hernia containing small bowel. Colonic diverticulosis without diverticulitis. Aortic Atherosclerosis (ICD10-I70.0) and Emphysema (ICD10-J43.9). Electronically Signed   By: Keith Rake M.D.   On:  06/21/2021 22:02   DG Chest Portable 1 View  Result Date: 06/20/2021 CLINICAL DATA:  SOB EXAM: PORTABLE CHEST 1 VIEW COMPARISON:  Radiograph dated November 11, 2020 FINDINGS: Evaluation is limited by rotation. The cardiomediastinal silhouette is unchanged in contour. Small RIGHT pleural effusion. No pneumothorax. Peribronchial cuffing with interstitial prominence. Patchy bibasilar opacities. Visualized abdomen is unremarkable. IMPRESSION: Constellation of findings may reflect pulmonary edema with scattered areas of atelectasis. Atypical infection could present similarly. Electronically Signed   By: Valentino Saxon M.D.   On: 06/20/2021 18:41       Impression / Plan:   Weight loss, generalized abdominal pain, dyspepsia, irregular bowel habits, decreased ostomy output abnormal CT of hartmanns pouch, history of adenomatous polyps, s/p colostomy formation after partial colon resection for complicated diverticulitis.  For now recommending egd and colonoscopy for luminal evaluation with monitored anesthesia with pulmonology ok. Agree with  bid pantoprazole. States she is no longer using nsaids.  Thank you very much for this consult. These services were provided by Stephens November, NP-C, in collaboration with Volney American DO, with whom I have discussed this patient in full.   Stephens November, NP-C

## 2021-06-22 NOTE — Progress Notes (Signed)
Progress Note   Patient: Misty Green KPT:[id number] DOB: [birth year] DOA: 06/20/2021     2 DOS: the patient was seen and examined on 06/22/2021   Brief hospital course: 51 year old female past medical history of chronic respiratory failure secondary to COPD on 3 L/min O2 at baseline and trilogy when sleeping, hypertension, diastolic CHF, peripheral neuropathy, chronic pain syndrome, history of cardiac arrest, OSA, GERD, remote tobacco abuse, history of diverticulitis status post partial resection with colostomy presented to the ED on the evening of 06/20/2021 with worsening generalized weakness, cough and shortness of breath.  EMS was called and found her O2 sats 70s on her typical 3 L/min oxygen.  On arrival to the ER, patient required BiPAP and blood gas showed hypercapnia.  Chest x-ray was concerning for pneumonia and or CHF.  Admitted to the hospital and started on IV steroids and IV antibiotics.  Maintained on oral diuretic given less suspicion for decompensated CHF.  6/4: Patient reports 43 pounds weight loss in about 8 weeks due to daily nausea vomiting and not tolerating p.o. intake.  She wakes up diaphoretic and nauseous every morning.  She has abdominal pain as well and is tender on exam.  She reports history of hernia repair with mesh, colostomy due to diverticulitis in the past requiring partial colon resection.  Assessment and Plan: * Acute on chronic respiratory failure with hypoxemia (HCC) Baseline O2 requirement 3 L/min, uses trilogy NIV overnight and whenever sleeping at home.  Presented with progressive shortness of breath and increased oxygen requirements well above her baseline.  Required BiPAP in the ED. -- Treat COPD and pneumonia as outlined -- Supplemental O2 to maintain sats 90 to 94%, wean as tolerated -- BiPAP when sleeping and as needed --Pulmonology consulted given chest findings on CT 6/4 with "tree-in-bud nodules"  CAP (community acquired pneumonia) Presented with  cough, worsening shortness of breath acute on chronic respiratory failure.  Chest x-ray with constellation of findings that may reflect pulmonary edema with scattered areas of atelectasis versus atypical infection. --Continue Rocephin and Zithromax -- Mucinex -- Pulmonary hygiene -- Treat COPD as outlined  COPD exacerbation ([place]) Presented in respiratory distress requiring BiPAP.  Improving on IV steroids and antibiotics.  Chronic respiratory failure with hypoxia and hypercapnia -baseline O2 requirement 3 L/min, Trilogy NIV when sleeping.  On Trelegy, Daliresp at home. -- Continue IV Solu-Medrol -- Scheduled Atrovent nebs --As needed albuterol nebs -- Continue Daliresp -- Add Pulmicort nebs -- Pulmonology consulted  History of diverticulitis Status post partial colon resection with colostomy.  Patient is having lower abdominal pain and tenderness on exam with daily nausea vomiting at home leading to unintentional weight loss.  Patient and daughter report her passing mucus from her rectum recently as well. Patient reports she was to be evaluated for colostomy takedown prior to the [place] pandemic.  High risk surgical candidate given her respiratory status. -- CT abdomen pelvis 6/4 no diverticulitis or other acute abdominal findings --GI consulted  Nausea & vomiting Patient reports waking up daily diaphoretic and nauseous, very poor tolerance for oral intake leading to unintentional weight loss. -- CT abdomen pelvis unrevealing -- As needed antiemetics --GI following, appreciate recommendations --Anticipate endoscopic evaluation  Abdominal tenderness Etiology to be determined.  History of diverticulitis status post colostomy.  History of inguinal hernia repair with mesh.  Patient complains of abdominal pain and daily nausea vomiting at home with unintentional weight loss.  Further work-up is warranted. -- CT abdomen pelvis on 6/4 unrevealing for a  cause --GI consulted --Anticipate  endoscopic evaluation --Hold Lovenox, place SCD's  Elevated troponin Likely demand ischemia in the setting of acute on chronic respiratory failure.  Initial troponin 19 >> 55 but patient has no complaints of any chest pain. -- Stat EKG and repeat troponin if chest pain develops -- Repeat this AM trended down  History of repair of inguinal hernia Noted.  CT abdomen pelvis to evaluate abdominal tenderness and nausea vomiting  Unintentional weight loss 43 pound weight loss in about 8 weeks secondary to poor tolerance for any oral intake with daily nausea vomiting.  See abdominal tenderness, nausea and vomiting for management. -- Dietitian consult --Further evaluation as outlined  Hypokalemia Presented with potassium 2.7, replaced. K today 3.6 -- Monitor BMP, replace K as needed -- Monitor Mg level as well  Acute on chronic diastolic CHF (congestive heart failure) ([place]) Patient appears euvolemic on her oral Lasix.  No significant peripheral edema and respiratory decompensation appears more related to COPD and potential pneumonia. -- Continue oral Lasix 40 mg daily -- Monitor volume status closely  Obesity Body mass index is 30.88 kg/m. With 43 pound unintentional weight loss in the past approximate 8 weeks related to abdominal pain and daily nausea vomiting.  OSA on CPAP Patient was initially on CPAP, now on trilogy.  Continue BiPAP nightly and as needed  Anxiety Resume home Wellbutrin, Lexapro, Seroquel  Chronic pain syndrome Patient has long history of back pain and degenerative changes. --Pain control as needed --Tylenol, oxycodone, IV morphine for breakthrough pain        Subjective: Patient seen awake sitting up in bed with husband at bedside on rounds.  Pt having severe back pain which is chronic but exacerbated in the hospital bed. Minimal relief with morphine so far this AM.  We discussed CT findings, pulmonology and GI consults.  She expresses appreciation.   Currently no other acute complaints.  Breathing has improved.  Physical Exam: Vitals:   06/22/21 0827 06/22/21 1158 06/22/21 1447 06/22/21 1644  BP: 128/80 129/85  (!) 148/83  Pulse: 89 80  76  Resp: '17 16  17  '$ Temp: 97.9 F (36.6 C) 97.8 F (36.6 C)  97.8 F (36.6 C)  TempSrc:  Oral    SpO2: 95% 93% 91% (!) 89%  Weight:      Height:       General exam: awake, alert, in moderate distress due to pain, appears very uncomfortable HEENT: Wearing nasal cannula, moist mucus membranes, hearing grossly normal  Respiratory system: improved air movement still with expiratory wheezes throughout, normal respiratory effort at rest, on 3 L/min [place] O2. Cardiovascular system: normal S1/S2, RRR, no peripheral edema.   Gastrointestinal system: Abdomen nontender today, colostomy on left, no distention Central nervous system: A&O x4. no gross focal neurologic deficits, normal speech Extremities: moves all, no edema, no cyanosis Psychiatry: normal mood, congruent affect, judgement and insight appear normal   Data Reviewed:  Notable labs: Chloride 86, bicarb 38, glucose 117, , Troponin down trended 18, procalcitonin 0.24, white count 11.7 improved, hemoglobin 11.9  CT chest abdomen pelvis yesterday IMPRESSION: 1. Scattered ill-defined pulmonary opacities, some of which are tree-in-bud within the lower lobes and right middle lobe. Many of these opacities are small, ill-defined and nodular in appearance.  Favor infection/inflammation, including aspiration in the setting of repeated vomiting. 2. Findings suspicious for malignancy. 3. Mild emphysema and bronchial thickening. 4. Nonobstructing left nephrolithiasis. 5. Descending colostomy with peristomal hernia containing small bowel. Colonic diverticulosis without diverticulitis.  Family Communication: Husband at bedside on rounds.  Disposition: Status is: Inpatient Remains inpatient appropriate because: Remains on IV therapies and further  evaluation not appropriate for outpatient setting   Planned Discharge Destination: Home    Time spent: 40 minutes  Author: Ezekiel Slocumb, DO 06/22/2021 7:06 PM  For on call review [email or web address].

## 2021-06-22 NOTE — TOC Initial Note (Signed)
Transition of Care [facility]) - Initial/Assessment Note    Patient Details  Name: Misty Green MRN: [id number] Date of Birth: July 09, 1970  [facility]) CM/SW Contact:    Laurena Slimmer, RN Phone Number: 06/22/2021, 4:41 PM  Clinical Narrative:                  Transition of Care [facility]) Screening Note   Patient Details  Name: Misty Green Date of Birth: [birth year]   Transition of Care [facility]) CM/SW Contact:    Laurena Slimmer, RN Phone Number: 06/22/2021, 4:41 PM    Transition of Care Department [facility]) has reviewed patient and no TOC needs have been identified at this time. We will continue to monitor patient advancement through interdisciplinary progression rounds. If new patient transition needs arise, please place a TOC consult.          Patient Goals and CMS Choice        Expected Discharge Plan and Services                                                Prior Living Arrangements/Services                       Activities of Daily Living Home Assistive Devices/Equipment: None ADL Screening (condition at time of admission) Patient's cognitive ability adequate to safely complete daily activities?: Yes Is the patient deaf or have difficulty hearing?: No Does the patient have difficulty seeing, even when wearing glasses/contacts?: Yes Does the patient have difficulty concentrating, remembering, or making decisions?: No Patient able to express need for assistance with ADLs?: Yes Does the patient have difficulty dressing or bathing?: No Independently performs ADLs?: Yes (appropriate for developmental age) Does the patient have difficulty walking or climbing stairs?: Yes Weakness of Legs: None Weakness of Arms/Hands: None  Permission Sought/Granted                  Emotional Assessment              Admission diagnosis:  Hypoxia [R09.02] COPD exacerbation (HCC) [J44.1] Acute respiratory failure with hypoxia ([place])  [J96.01] Acute on chronic respiratory failure with hypoxemia ([place]) [J96.21] Community acquired pneumonia, unspecified laterality [J18.9] Congestive heart failure, unspecified HF chronicity, unspecified heart failure type ([place]) [I50.9] Patient Active Problem List   Diagnosis Date Noted   Unintentional weight loss 06/21/2021   Abdominal tenderness 06/21/2021   Nausea & vomiting 06/21/2021   History of diverticulitis 06/21/2021   History of repair of inguinal hernia 06/21/2021   Elevated troponin 06/21/2021   Acute on chronic respiratory failure with hypoxemia ([place]) 06/20/2021   CAP (community acquired pneumonia) 06/20/2021   Hypokalemia 06/20/2021   Acute on chronic respiratory failure with hypoxia and hypercapnia ([place]) 01/22/2020   Acute on chronic diastolic CHF (congestive heart failure) ([place]) 01/22/2020   Respiratory failure with hypercapnia ([place]) 07/08/2019   Chronic fatigue 05/16/2019   SOB (shortness of breath) 05/16/2019   Seasonal allergic reaction 05/16/2019   Anxiety disorder due to multiple medical problems 05/16/2019   Edema 05/16/2019   Encounter for screening mammogram for malignant neoplasm of breast 05/16/2019   OSA (obstructive sleep apnea)    Noncompliance with CPAP treatment    Chronic obstructive pulmonary disease ([place])    Chest wall pain    Essential hypertension  Steroid-induced hyperglycemia    Anemia of chronic disease    AKI (acute kidney injury) ([place])    Supplemental oxygen dependent    Generalized anxiety disorder    Hypoxic encephalopathy ([place]) 05/27/2018   Anoxic brain injury ([place])    ARF (acute renal failure) (HCC)    Lactic acidosis    Transaminitis    Acute on chronic respiratory failure with hypercapnia (HCC)    Cardiac arrest ([place]) 05/12/2018   Diverticulitis of colon (without mention of hemorrhage)(562.11)    Acute diverticulitis 01/06/2018   Mixed anxiety and depressive disorder 12/22/2017   Obesity 12/22/2017   Tobacco user 12/22/2017    Diverticulitis 12/03/2017   Spinal stenosis of lumbar region 04/12/2016   Acute respiratory failure ([place]) 03/22/2016   COPD exacerbation ([place]) 03/22/2016   Chronic pain syndrome 03/22/2016   Opioid type dependence, abuse ([place]) 03/22/2016   Polycythemia 03/22/2016   Anxiety 03/22/2016   OSA on CPAP 03/22/2016   Closed fracture of distal end of right radius 08/25/2015   Closed nondisplaced fracture of styloid process of right ulna 08/25/2015   PCP:  [name], MD Pharmacy:   [id number] [id number]: [phone]: [address] [address], [id number]: [phone]: [phone]    Social Determinants of Health (SDOH) Interventions    Readmission Risk Interventions     View : No data to display.

## 2021-06-23 ENCOUNTER — Encounter
Admission: EM | Disposition: A | Discharge status: Home / Self Care | Payer: Self-pay | Source: Home / Self Care | Attending: Internal Medicine

## 2021-06-23 ENCOUNTER — Encounter: Payer: Self-pay | Admitting: Internal Medicine

## 2021-06-23 ENCOUNTER — Inpatient Hospital Stay: Payer: 59 | Admitting: Anesthesiology

## 2021-06-23 HISTORY — PX: ESOPHAGOGASTRODUODENOSCOPY (EGD) WITH PROPOFOL: SHX5813

## 2021-06-23 LAB — BASIC METABOLIC PANEL
Anion gap: 10 (ref 5–15)
BUN: 16 mg/dL (ref 6–20)
CO2: 42 mmol/L — ABNORMAL HIGH (ref 22–32)
Calcium: 9.6 mg/dL (ref 8.9–10.3)
Chloride: 81 mmol/L — ABNORMAL LOW (ref 98–111)
Creatinine, Ser: 0.55 mg/dL (ref 0.44–1.00)
GFR, Estimated: >60 mL/min (ref >60)
Glucose, Bld: 117 mg/dL — ABNORMAL HIGH (ref 70–99)
Potassium: 4 mmol/L (ref 3.5–5.1)
Sodium: 133 mmol/L — ABNORMAL LOW (ref 135–145)

## 2021-06-23 LAB — CBC
HCT: 38.2 % (ref 36.0–46.0)
Hemoglobin: 11.7 g/dL — ABNORMAL LOW (ref 12.0–15.0)
MCH: 28.9 pg (ref 26.0–34.0)
MCHC: 30.6 g/dL (ref 30.0–36.0)
MCV: 94.3 fL (ref 80.0–100.0)
Platelets: 354 10*3/uL (ref 150–400)
RBC: 4.05 MIL/uL (ref 3.87–5.11)
RDW: 12.7 % (ref 11.5–15.5)
WBC: 8.7 10*3/uL (ref 4.0–10.5)
nRBC: 0 % (ref 0.0–0.2)

## 2021-06-23 LAB — PROCALCITONIN: Procalcitonin: 0.14 ng/mL

## 2021-06-23 LAB — MAGNESIUM: Magnesium: 2.6 mg/dL — ABNORMAL HIGH (ref 1.7–2.4)

## 2021-06-23 SURGERY — ESOPHAGOGASTRODUODENOSCOPY (EGD) WITH PROPOFOL
Anesthesia: General

## 2021-06-23 MED ORDER — IPRATROPIUM-ALBUTEROL 0.5-2.5 (3) MG/3ML IN SOLN
3.0000 mL | Freq: Four times a day (QID) | RESPIRATORY_TRACT | Status: DC
  Administered 2021-06-23 – 2021-06-24 (×4): 3 mL via RESPIRATORY_TRACT
  Filled 2021-06-23 (×4): qty 3

## 2021-06-23 MED ORDER — PROPOFOL 10 MG/ML IV BOLUS
INTRAVENOUS | Status: DC | PRN
  Administered 2021-06-23 (×5): 20 mg via INTRAVENOUS

## 2021-06-23 MED ORDER — PEG 3350-KCL-NA BICARB-NACL 420 G PO SOLR: 2000.0000 mL | Freq: Once | ORAL | Status: DC | PRN

## 2021-06-23 MED ORDER — FLEET ENEMA 7-19 GM/118ML RE ENEM
1.0000 | ENEMA | Freq: Once | RECTAL | Status: AC
  Administered 2021-06-24: 1 via RECTAL

## 2021-06-23 MED ORDER — SODIUM CHLORIDE 0.9 % IV SOLN: INTRAVENOUS | Status: DC

## 2021-06-23 MED ORDER — PEG 3350-KCL-NA BICARB-NACL 420 G PO SOLR
4000.0000 mL | Freq: Once | ORAL | Status: AC
  Administered 2021-06-23: 4000 mL via ORAL
  Filled 2021-06-23: qty 4000

## 2021-06-23 MED ORDER — STERILE WATER FOR INJECTION IJ SOLN
INTRAMUSCULAR | Status: AC
  Filled 2021-06-23: qty 10

## 2021-06-23 NOTE — Interval H&P Note (Signed)
History and Physical Interval Note: **correction- Progress note from 06/23/21

## 2021-06-23 NOTE — Progress Notes (Signed)
PULMONOLOGY         Date: 06/23/2021,   MRN# [id number] Misty Green 04-05-1970     AdmissionWeight: 81.6 kg                 CurrentWeight: 81.6 kg  Referring provider: Dr Arbutus Ped   CHIEF COMPLAINT:   Abnormal CT chest with findings to suggest primary bronchogenic carcinoma   HISTORY OF PRESENT ILLNESS   51 year old female with a history of CHF, current smoker advanced COPD GERD, essential hypertension and chronic hypoxemia with OSA overlap, previous cardiac arrest, uses 3 L/min on outpatient came in with acute on chronic hypoxemia required BiPAP initially chest x-ray was abnormal with findings of possible pneumonia and primary bronchogenic carcinoma.  Reviewed CT chest independently with findings of chronic bronchitic changes bilaterally, significant motion artifact tree-in-bud opacification bronchiectatic changes worse on the right lower lobe.   06/23/21- patient is improved and is being optimized for dc post GI scoping. She should continue proair qid PRN, albuterol soln via neb prn and Trelegy once daily upon dc.  I met with husband and reviewed care plan as well as left my contact information for follow up.    PAST MEDICAL HISTORY   Past Medical History:  Diagnosis Date   Cardiac arrest ([place]) 05/12/2018   CHF (congestive heart failure) (HCC)    Chicken pox    COPD (chronic obstructive pulmonary disease) (HCC)    Current smoker    Dyspnea    GERD (gastroesophageal reflux disease)    Hypertension    Malfunction of colostomy and enterostomy (HCC)    Opiate abuse, continuous ([place])    Oxygen deficit    Sleep apnea      SURGICAL HISTORY   Past Surgical History:  Procedure Laterality Date   ABDOMINAL HYSTERECTOMY     BACK SURGERY     cesction     COLOSTOMY  01/13/2018   Procedure: COLOSTOMY Creation;  Surgeon: Jules Husbands, MD;  Location: [facility] ORS;  Service: General;;   HERNIA REPAIR     INCISIONAL HERNIA REPAIR     lower midline laparotomy  incision (hysterectomy), repaired with mesh   IR FLUORO GUIDE CV LINE RIGHT  05/26/2018   IR REMOVAL TUN CV CATH W/O FL  06/02/2018   IR US GUIDE VASC ACCESS RIGHT  05/26/2018   LAPAROSCOPIC CHOLECYSTECTOMY  2012   LAPAROSCOPIC LYSIS OF ADHESIONS  01/13/2018   Procedure: LAPAROSCOPIC LYSIS OF ADHESIONS;  Surgeon: Jules Husbands, MD;  Location: [facility] ORS;  Service: General;;   LAPAROSCOPIC SIGMOID COLECTOMY  01/13/2018   Procedure: LAPAROSCOPIC SIGMOID COLECTOMY;  Surgeon: Jules Husbands, MD;  Location: [facility] ORS;  Service: General;;   LUMBAR SPINE SURGERY     ORIF WRIST FRACTURE Right 08/25/2015   Procedure: OPEN REDUCTION INTERNAL FIXATION (ORIF) WRIST FRACTURE;  Surgeon: Corky Mull, MD;  Location: [facility] ORS;  Service: Orthopedics;  Laterality: Right;   OVARIAN CYST REMOVAL       FAMILY HISTORY   Family History  Problem Relation Age of Onset   Diabetes Mother    COPD Mother    Hypertension Father    Diabetes Father    Heart disease Father    Hyperlipidemia Father    Prostate cancer Father      SOCIAL HISTORY   Social History   Tobacco Use   Smoking status: Former    Packs/day: 1.00    Years: 15.00    Pack years: 15.00  Types: Cigarettes   Smokeless tobacco: Never   Tobacco comments:    4 cigarettes a day   Vaping Use   Vaping Use: Former   Substances: Nicotine  Substance Use Topics   Alcohol use: Yes    Comment: rarely   Drug use: Yes    Types: Marijuana    Comment: not everyday      MEDICATIONS    Home Medication:    Current Medication:  Current Facility-Administered Medications:    0.9 %  sodium chloride infusion, , Intravenous, Continuous, [name], DO, Last Rate: 20 mL/hr at 06/23/21 0557, New Bag at 06/23/21 0557   acetaminophen (TYLENOL) tablet 1,000 mg, 1,000 mg, Oral, Q8H, 1,000 mg at 06/22/21 2132 **OR** acetaminophen (TYLENOL) suppository 650 mg, 650 mg, Rectal, Q8H, Griffith, Kelly A, DO   albuterol (PROVENTIL) (2.5 MG/3ML)  0.083% nebulizer solution 2.5 mg, 2.5 mg, Nebulization, Q6H, Garba, Mohammad L, MD, 2.5 mg at 06/23/21 0737   ALPRAZolam (XANAX) tablet 0.25 mg, 0.25 mg, Oral, TID PRN, Nicole Kindred A, DO, 0.25 mg at 06/22/21 2200   azithromycin (ZITHROMAX) tablet 500 mg, 500 mg, Oral, Daily, Benita Gutter, RPH, 500 mg at 06/22/21 1937   budesonide (PULMICORT) nebulizer solution 0.25 mg, 0.25 mg, Nebulization, BID, Nicole Kindred A, DO, 0.25 mg at 06/23/21 0737   buPROPion (WELLBUTRIN) tablet 75 mg, 75 mg, Oral, BID, Nicole Kindred A, DO, 75 mg at 06/22/21 2132   cefTRIAXone (ROCEPHIN) 2 g in sodium chloride 0.9 % 100 mL IVPB, 2 g, Intravenous, Q24H, Harvest Dark, MD, Stopped at 06/22/21 2207   diphenhydrAMINE (BENADRYL) injection 12.5 mg, 12.5 mg, Intravenous, Q8H PRN, Nicole Kindred A, DO, 12.5 mg at 06/22/21 1703   docusate sodium (COLACE) capsule 100 mg, 100 mg, Oral, BID, [name], DO, 100 mg at 06/22/21 2132   escitalopram (LEXAPRO) tablet 20 mg, 20 mg, Oral, Daily, Nicole Kindred A, DO, 20 mg at 06/22/21 0300   furosemide (LASIX) tablet 40 mg, 40 mg, Oral, Daily, Gala Romney L, MD, 40 mg at 06/22/21 0905   gabapentin (NEURONTIN) capsule 400 mg, 400 mg, Oral, TID, Nicole Kindred A, DO, 400 mg at 06/22/21 2132   guaiFENesin (MUCINEX) 12 hr tablet 600 mg, 600 mg, Oral, BID, Jonelle Sidle, Mohammad L, MD, 600 mg at 06/22/21 2132   HYDROcodone-acetaminophen (NORCO/VICODIN) 5-325 MG per tablet 1-2 tablet, 1-2 tablet, Oral, Q6H PRN, Nicole Kindred A, DO, 2 tablet at 06/22/21 2132   ipratropium (ATROVENT) nebulizer solution 0.5 mg, 0.5 mg, Nebulization, Q6H, Jonelle Sidle, Mohammad L, MD, 0.5 mg at 06/23/21 0737   lip balm (BLISTEX) ointment, , Topical, PRN, Madueme, Elvira C, RPH   methylPREDNISolone sodium succinate (SOLU-MEDROL) 40 mg/mL injection 40 mg, 40 mg, Intravenous, Q12H, Jonelle Sidle, Mohammad L, MD, 40 mg at 06/23/21 0131   metoprolol tartrate (LOPRESSOR) injection 5 mg, 5 mg, Intravenous, Q6H  PRN, Jonelle Sidle, Mohammad L, MD   morphine (PF) 2 MG/ML injection 2 mg, 2 mg, Intravenous, Q2H PRN, Nicole Kindred A, DO, 2 mg at 06/23/21 1034   ondansetron (ZOFRAN) tablet 4 mg, 4 mg, Oral, Q6H PRN **OR** ondansetron (ZOFRAN) injection 4 mg, 4 mg, Intravenous, Q6H PRN, Jonelle Sidle, Mohammad L, MD, 4 mg at 06/22/21 1355   oxyCODONE (Oxy IR/ROXICODONE) immediate release tablet 5-10 mg, 5-10 mg, Oral, Q4H PRN, Nicole Kindred A, DO, 5 mg at 06/22/21 1359   pantoprazole (PROTONIX) EC tablet 40 mg, 40 mg, Oral, BID, Nicole Kindred A, DO, 40 mg at 06/22/21 2132   polyethylene glycol (MIRALAX /  GLYCOLAX) packet 17 g, 17 g, Oral, BID, [name], DO, 17 g at 06/22/21 2131   potassium chloride 10 mEq in 100 mL IVPB, 10 mEq, Intravenous, Once, Elwyn Reach, MD   QUEtiapine (SEROQUEL) tablet 25 mg, 25 mg, Oral, QHS, Nicole Kindred A, DO, 25 mg at 06/22/21 2132   roflumilast (DALIRESP) tablet 500 mcg, 500 mcg, Oral, Daily, Ezekiel Slocumb, DO, 500 mcg at 06/22/21 8676    ALLERGIES   Other     REVIEW OF SYSTEMS    Review of Systems:  Gen:  Denies  fever, sweats, chills weigh loss  HEENT: Denies blurred vision, double vision, ear pain, eye pain, hearing loss, nose bleeds, sore throat Cardiac:  No dizziness, chest pain or heaviness, chest tightness,edema Resp:   reports dyspnea chronically  Gi: Denies swallowing difficulty, stomach pain, nausea or vomiting, diarrhea, constipation, bowel incontinence Gu:  Denies bladder incontinence, burning urine Ext:   Denies Joint pain, stiffness or swelling Skin: Denies  skin rash, easy bruising or bleeding or hives Endoc:  Denies polyuria, polydipsia , polyphagia or weight change Psych:   Denies depression, insomnia or hallucinations   Other:  All other systems negative   VS: BP 131/82 (BP Location: Right Arm)   Pulse 90   Temp 97.9 F (36.6 C)   Resp 19   Ht 5' 4"  (1.626 m)   Wt 81.6 kg   SpO2 96%   BMI 30.88 kg/m      PHYSICAL  EXAM    GENERAL:NAD, no fevers, chills, no weakness no fatigue HEAD: Normocephalic, atraumatic.  EYES: Pupils equal, round, reactive to light. Extraocular muscles intact. No scleral icterus.  MOUTH: Moist mucosal membrane. Dentition intact. No abscess noted.  EAR, NOSE, THROAT: Clear without exudates. No external lesions.  NECK: Supple. No thyromegaly. No nodules. No JVD.  PULMONARY: decreased breath sounds with mild rhonchi worse at bases bilaterally.  CARDIOVASCULAR: S1 and S2. Regular rate and rhythm. No murmurs, rubs, or gallops. No edema. Pedal pulses 2+ bilaterally.  GASTROINTESTINAL: Soft, nontender, nondistended. No masses. Positive bowel sounds. No hepatosplenomegaly.  MUSCULOSKELETAL: No swelling, clubbing, or edema. Range of motion full in all extremities.  NEUROLOGIC: Cranial nerves II through XII are intact. No gross focal neurological deficits. Sensation intact. Reflexes intact.  SKIN: No ulceration, lesions, rashes, or cyanosis. Skin warm and dry. Turgor intact.  PSYCHIATRIC: Mood, affect within normal limits. The patient is awake, alert and oriented x 3. Insight, judgment intact.       IMAGING      ASSESSMENT/PLAN   Acute on chronic hypoxemic respiratory failure    -Reviewed CT chest with findings of chronic bronchitic changes and bilateral bronchiectasis and tree-in-bud opacification.  I did not see changes that are consistent with a bronchogenic carcinoma and rather mucoid impaction is seen bilaterally.  I would treat with antibiotics for now as well as mucolytic's and have outpatient work-up with repeat imaging in 6 to 8 weeks after steroids and antibiotics -Patient should have immunologic work-up as this can be seen in  hypogammaglobulinemia and other autoimmune diseases such as Crohn's/UC -Rarely mycobacterial infection chronically can appear this way as well -I agree with Zithromax and Rocephin empirically for CAP coverage and Solu-Medrol 40 mg every 12 -Mucinex  600 twice daily -There is absence of interstitial pulmonary edema -Bronchopulmonary hygiene with incentive spirometry and flutter valve -Microbiology work-up with respiratory cultures, MRSA PCR, procalcitonin trend, Fungitell and Aspergillus antibody -Patient may require airway inspection and bronchoalveolar lavage via bronchoscopy  on outpatient.  -she quit smoking 12 months ago      Pulmonary pre-operative evaluation    - patient is low risk for EGD/Colonoscopy and is able to have anesthesia    Thank you for allowing me to participate in the care of this patient.   Patient/Family are satisfied with care plan and all questions have been answered.    Provider disclosure: Patient with at least one acute or chronic illness or injury that poses a threat to life or bodily function and is being managed actively during this encounter.  All of the below services have been performed independently by signing provider:  review of prior documentation from internal and or external health records.  Review of previous and current lab results.  Interview and comprehensive assessment during patient visit today. Review of current and previous chest radiographs/CT scans. Discussion of management and test interpretation with health care team and patient/family.   This document was prepared using Dragon voice recognition software and may include unintentional dictation errors.     Ottie Glazier, M.D.  Division of Pulmonary & Critical Care Medicine

## 2021-06-23 NOTE — Interval H&P Note (Signed)
History and Physical Interval Note: Preprocedure H&P from 06/23/21  was reviewed and there was no interval change after seeing and examining the patient.  Written consent was obtained from the patient after discussion of risks, benefits, and alternatives. Patient has consented to proceed with Esophagogastroduodenoscopy with possible intervention   06/23/2021 1:35 PM  Misty Green  has presented today for surgery, with the diagnosis of melena, weight loss.  The various methods of treatment have been discussed with the patient and family. After consideration of risks, benefits and other options for treatment, the patient has consented to  Procedure(s): ESOPHAGOGASTRODUODENOSCOPY (EGD) WITH PROPOFOL (N/A) as a surgical intervention.  The patient's history has been reviewed, patient examined, no change in status, stable for surgery.  I have reviewed the patient's chart and labs.  Questions were answered to the patient's satisfaction.     [name]

## 2021-06-23 NOTE — Op Note (Signed)
[facility] Patient Name: Misty Green Procedure Date: 06/23/2021 1:44 PM MRN: [id number] Account #: [id number] Date of Birth: 10/30/1970 Admit Type: Inpatient Age: 51 Room: [facility] ENDO ROOM 1 Gender: Female Note Status: Finalized Instrument Name: Upper Endoscope [id number] Procedure:             Upper GI endoscopy Indications:           Melena, Anemia, Weight loss Providers:             Rueben Bash, DO Referring MD:          [name], MD (Referring MD) Medicines:             Monitored Anesthesia Care Complications:         No immediate complications. Estimated blood loss:                         Minimal. Procedure:             Pre-Anesthesia Assessment:                        - Prior to the procedure, a History and Physical was                         performed, and patient medications and allergies were                         reviewed. The patient is competent. The risks and                         benefits of the procedure and the sedation options and                         risks were discussed with the patient. All questions                         were answered and informed consent was obtained.                         Patient identification and proposed procedure were                         verified by the physician, the nurse, the anesthetist                         and the technician in the endoscopy suite. Mental                         Status Examination: alert and oriented. Airway                         Examination: normal oropharyngeal airway and neck                         mobility. Respiratory Examination: poor air movement.                         CV Examination: regular rate and rhythm. Prophylactic  Antibiotics: The patient does not require prophylactic                         antibiotics. Prior Anticoagulants: The patient has                         taken no previous anticoagulant or  antiplatelet                         agents. ASA Grade Assessment: III - A patient with                         severe systemic disease. After reviewing the risks and                         benefits, the patient was deemed in satisfactory                         condition to undergo the procedure. The anesthesia                         plan was to use monitored anesthesia care (MAC).                         Immediately prior to administration of medications,                         the patient was re-assessed for adequacy to receive                         sedatives. The heart rate, respiratory rate, oxygen                         saturations, blood pressure, adequacy of pulmonary                         ventilation, and response to care were monitored                         throughout the procedure. The physical status of the                         patient was re-assessed after the procedure.                        After obtaining informed consent, the endoscope was                         passed under direct vision. Throughout the procedure,                         the patient's blood pressure, pulse, and oxygen                         saturations were monitored continuously. The Endoscope                         was introduced through the mouth, and advanced to the  second part of duodenum. The upper GI endoscopy was                         accomplished without difficulty. The patient tolerated                         the procedure well. Findings:      The duodenal bulb, first portion of the duodenum and second portion of       the duodenum were normal. Estimated blood loss: none.      Localized moderate inflammation characterized by erythema was found in       the gastric antrum. Biopsies were taken with a cold forceps for       Helicobacter pylori testing. Estimated blood loss was minimal. Food       retained in the antrum impeding total visualization. Unable  to rule out       any erosion/ulceration underlying food. No signs of fresh or old blood      The exam of the stomach was otherwise normal.      The Z-line was variable. Less than 1 cm- no nodularity. Not biopsied.       Estimated blood loss: none.      Esophagogastric landmarks were identified: the gastroesophageal junction       was found at 37 cm from the incisors.      The exam of the esophagus was otherwise normal.      No signs of obstruction Impression:            - Normal duodenal bulb, first portion of the duodenum                         and second portion of the duodenum.                        - Gastritis. Biopsied.                        - Z-line variable.                        - Esophagogastric landmarks identified. Recommendation:        - Return patient to hospital ward for ongoing care.                        - Clear liquid diet.                        - Continue present medications.                        - Await pathology results.                        - Continue protonix po 40 mg bid                        NPO at midnight                        Plan for endoscopic evaluation of ostomy and remaining  colon through ostomy and through rectum tomorrow.                        Start GoLytely prep tonight                        Enema x2 tomorrow                        EGD supportive of gastroparesis- going forward,                         small/frequent meals                        - The findings and recommendations were discussed with                         the patient.                        - The findings and recommendations were discussed with                         the referring physician. Procedure Code(s):     --- Professional ---                        [phone], Esophagogastroduodenoscopy, flexible,                         transoral; with biopsy, single or multiple Diagnosis Code(s):     --- Professional ---                        K29.70,  Gastritis, unspecified, without bleeding                        K22.8, Other specified diseases of esophagus                        K92.1, Melena (includes Hematochezia)                        D64.9, Anemia, unspecified                        R63.4, Abnormal weight loss CPT copyright 2019 [employer] Association. All rights reserved. The codes documented in this report are preliminary and upon coder review may  be revised to meet current compliance requirements. Attending Participation:      I personally performed the entire procedure. Volney American, DO [name] DO, DO 06/23/2021 2:06:27 PM This report has been signed electronically. Number of Addenda: 0 Note Initiated On: 06/23/2021 1:44 PM Estimated Blood Loss:  Estimated blood loss was minimal.      [facility]

## 2021-06-23 NOTE — Progress Notes (Signed)
Progress Note   Patient: Misty Green FAO:[id number] DOB: 05/04/1970 DOA: 06/20/2021     3 DOS: the patient was seen and examined on 06/23/2021   Brief hospital course: 51 year old female past medical history of chronic respiratory failure secondary to COPD on 3 L/min O2 at baseline and trilogy when sleeping, hypertension, diastolic CHF, peripheral neuropathy, chronic pain syndrome, history of cardiac arrest, OSA, GERD, remote tobacco abuse, history of diverticulitis status post partial resection with colostomy presented to the ED on the evening of 06/20/2021 with worsening generalized weakness, cough and shortness of breath.  EMS was called and found her O2 sats 70s on her typical 3 L/min oxygen.  On arrival to the ER, patient required BiPAP and blood gas showed hypercapnia.  Chest x-ray was concerning for pneumonia and or CHF.  Admitted to the hospital and started on IV steroids and IV antibiotics.  Maintained on oral diuretic given less suspicion for decompensated CHF.  6/4: Patient reports 43 pounds weight loss in about 8 weeks due to daily nausea vomiting and not tolerating p.o. intake.  She wakes up diaphoretic and nauseous every morning.  She has abdominal pain as well and is tender on exam.  She reports history of hernia repair with mesh, colostomy due to diverticulitis in the past requiring partial colon resection.  GI consulted.  Assessment and Plan: * Acute on chronic respiratory failure with hypoxemia (HCC) Baseline O2 requirement 3 L/min, uses trilogy NIV overnight and whenever sleeping at home.  Presented with progressive shortness of breath and increased oxygen requirements well above her baseline.  Required BiPAP in the ED. -- Treat COPD and pneumonia as outlined -- Supplemental O2 to maintain sats 90 to 94%, wean as tolerated -- BiPAP when sleeping and as needed --Pulmonology consulted given chest findings on CT 6/4 with "tree-in-bud nodules"  CAP (community acquired  pneumonia) Presented with cough, worsening shortness of breath acute on chronic respiratory failure.  Chest x-ray with constellation of findings that may reflect pulmonary edema with scattered areas of atelectasis versus atypical infection. --Continue Rocephin and Zithromax -- Mucinex -- Pulmonary hygiene -- Treat COPD as outlined  COPD exacerbation ([place]) Presented in respiratory distress requiring BiPAP.  Improving on IV steroids and antibiotics.  Chronic respiratory failure with hypoxia and hypercapnia -baseline O2 requirement 3 L/min, Trilogy NIV when sleeping.  On Trelegy, Daliresp at home. -- Continue IV Solu-Medrol -- Scheduled Atrovent nebs --As needed albuterol nebs -- Continue Daliresp -- Add Pulmicort nebs -- Pulmonology consulted  History of diverticulitis Status post partial colon resection with colostomy.  Patient is having lower abdominal pain and tenderness on exam with daily nausea vomiting at home leading to unintentional weight loss.  Patient and daughter report her passing mucus from her rectum recently as well. Patient reports she was to be evaluated for colostomy takedown prior to the [place] pandemic.  High risk surgical candidate given her respiratory status. -- CT abdomen pelvis 6/4 no diverticulitis or other acute abdominal findings --GI consulted  Nausea & vomiting Patient reports waking up daily diaphoretic and nauseous, very poor tolerance for oral intake leading to unintentional weight loss. Symptoms most consistent with gastroparesis, CT with stomach distended with PO contrast, EGD findings consistent with this as well. -- CT abdomen pelvis unrevealing -- As needed antiemetics --GI following, see recommendations --Endoscopic evaluations underway --Dietitian consulted for education on gastroparesis diet --Add Reglan if dietary changes not helpful enough (if QT not prolonged)  Abdominal tenderness Etiology to be determined.  History of diverticulitis  status  post colostomy.  History of inguinal hernia repair with mesh.  Patient complains of abdominal pain and daily nausea vomiting at home with unintentional weight loss.  Further work-up is warranted. -- CT abdomen pelvis on 6/4 unrevealing for a cause --GI consulted --EGD today --Further endoscopic eval tomorrow --NPO at midnight, clears today --Hold Lovenox, place SCD's  Elevated troponin Likely demand ischemia in the setting of acute on chronic respiratory failure.  Initial troponin 19 >> 55 but patient has no complaints of any chest pain. -- Stat EKG and repeat troponin if chest pain develops -- Repeat this AM trended down  History of repair of inguinal hernia Noted.  CT abdomen pelvis to evaluate abdominal tenderness and nausea vomiting  Unintentional weight loss 43 pound weight loss in about 8 weeks secondary to poor tolerance for any oral intake with daily nausea vomiting.  See abdominal tenderness, nausea and vomiting for management. -- Dietitian consult --Further evaluation as outlined  Hypokalemia Presented with potassium 2.7, replaced. K today 4.0 -- Monitor BMP, replace K as needed -- Monitor Mg level as well  Acute on chronic diastolic CHF (congestive heart failure) ([place]) Patient appears euvolemic on her oral Lasix.  No significant peripheral edema and respiratory decompensation appears more related to COPD and potential pneumonia. -- Continue oral Lasix 40 mg daily -- Monitor volume status closely  Obesity Body mass index is 30.88 kg/m. With 43 pound unintentional weight loss in the past approximate 8 weeks related to abdominal pain and daily nausea vomiting.  OSA on CPAP Patient was initially on CPAP, now on trilogy.  Continue BiPAP nightly and as needed  Anxiety Resume home Wellbutrin, Lexapro, Seroquel  Chronic pain syndrome Patient has long history of back pain and degenerative changes. --Pain control as needed --Tylenol, oxycodone, IV morphine for  breakthrough pain        Subjective: Patient awake sitting up in bed when seen on rounds today.  She reports being hungry, n.p.o. for EGD this afternoon.  Reports pain better controlled today and improves if she sits up edge of bed or up in the recliner as opposed to lay back in the bed.  No other acute complaints at this time.  She is very appreciative of evaluation being done given the long-term problems she's been having.  Physical Exam: Vitals:   06/23/21 1414 06/23/21 1424 06/23/21 1440 06/23/21 1534  BP: (!) 147/84 (!) 146/76 133/83 130/65  Pulse:   76 83  Resp:  '19 18 18  '$ Temp:   97.9 F (36.6 C) 97.9 F (36.6 C)  TempSrc:   Oral Oral  SpO2:   95% 94%  Weight:      Height:       General exam: awake, alert, no acute distress HEENT: Wearing nasal cannula, moist mucus membranes, hearing grossly normal  Respiratory system: no active wheezing, remains with poor aeration diminished throughout, normal respiratory effort at rest, on 3 L/min [place] O2. Cardiovascular system: normal S1/S2, RRR, no peripheral edema.   Gastrointestinal system: Abdomen mildly tender midline, colostomy on left, mild distention Central nervous system: A&O x4. no gross focal neurologic deficits, normal speech Extremities: moves all, no edema, no cyanosis Psychiatry: normal mood, congruent affect, judgement and insight appear normal   Data Reviewed:  Notable labs: Na 133, Cl 81, CO2 42, glucose 117, Mg 2.6,  WBC normalized, Hbg stable 11.7  EGD -- gastritis, retained food impeding complete visualization of antrum, but no obstruction. looks like she probably has gastroparesis though. Plan  for evaluation of remnant colon from ostomy and anus tomorrow if able to complete prep/cleanout. clear liquids today, npo at midnight   Family Communication: Husband at bedside on rounds 6/5  Disposition: Status is: Inpatient Remains inpatient appropriate because: Remains on IV therapies and further evaluation not  appropriate for outpatient setting.    Planned Discharge Destination: Home    Time spent: 40 minutesAuthor: Ezekiel Slocumb, DO 06/23/2021 5:04 PM  For on call review [email or web address].

## 2021-06-23 NOTE — Anesthesia Postprocedure Evaluation (Signed)
Anesthesia Post Note  Patient: Misty Green  Procedure(s) Performed: ESOPHAGOGASTRODUODENOSCOPY (EGD) WITH PROPOFOL  Patient location during evaluation: PACU Anesthesia Type: General Level of consciousness: awake and alert Pain management: pain level controlled Vital Signs Assessment: post-procedure vital signs reviewed and stable Respiratory status: spontaneous breathing, nonlabored ventilation, respiratory function stable and patient connected to nasal cannula oxygen Cardiovascular status: blood pressure returned to baseline and stable Postop Assessment: no apparent nausea or vomiting Anesthetic complications: no   No notable events documented.   Last Vitals:  Vitals:   06/23/21 1440 06/23/21 1534  BP: 133/83 130/65  Pulse: 76 83  Resp: 18 18  Temp: 36.6 C 36.6 C  SpO2: 95% 94%    Last Pain:  Vitals:   06/23/21 1534  TempSrc: Oral  PainSc:                  Molli Barrows

## 2021-06-23 NOTE — Transfer of Care (Signed)
Immediate Anesthesia Transfer of Care Note  Patient: Misty Green  Procedure(s) Performed: ESOPHAGOGASTRODUODENOSCOPY (EGD) WITH PROPOFOL  Patient Location: PACU  Anesthesia Type:General  Level of Consciousness: awake, alert  and oriented  Airway & Oxygen Therapy: Patient Spontanous Breathing and Patient connected to nasal cannula oxygen  Post-op Assessment: Report given to RN, Post -op Vital signs reviewed and stable and Patient moving all extremities  Post vital signs: Reviewed and stable  Last Vitals:  Vitals Value Taken Time  BP 132/77 06/23/21 1406  Temp 36 C 06/23/21 1404  Pulse 88 06/23/21 1407  Resp 20 06/23/21 1407  SpO2 100 % 06/23/21 1407  Vitals shown include unvalidated device data.  Last Pain:  Vitals:   06/23/21 1404  TempSrc: Temporal  PainSc:       Patients Stated Pain Goal: 2 (83/29/19 1660)  Complications: No notable events documented.

## 2021-06-23 NOTE — Plan of Care (Signed)

## 2021-06-23 NOTE — Progress Notes (Signed)
Inpatient Follow-up/Progress Note   Patient ID: Misty Green is a 51 y.o. female.  Overnight Events / Subjective Findings NAEON. Resting comfortably in bed receiving breath treatments. Has some soft light brown stool in ostomy bag today. Does not believe there was gas coming out. Still with abdominal discomfort around ostomy area. No blood or melena. NPO since midnight. No other acute gi complaints.  Review of Systems  Constitutional:  Positive for unexpected weight change. Negative for activity change, appetite change, chills, diaphoresis, fatigue and fever.  HENT:  Negative for trouble swallowing and voice change.   Respiratory:  Negative for shortness of breath and wheezing.   Cardiovascular:  Negative for chest pain, palpitations and leg swelling.  Gastrointestinal:  Positive for abdominal pain and constipation. Negative for abdominal distention, anal bleeding, blood in stool, diarrhea, nausea, rectal pain and vomiting.  Musculoskeletal:  Negative for arthralgias and myalgias.  Skin:  Negative for color change and pallor.  Neurological:  Negative for dizziness, syncope and weakness.  Psychiatric/Behavioral:  Negative for confusion.   All other systems reviewed and are negative.   Medications  Current Facility-Administered Medications:    0.9 %  sodium chloride infusion, , Intravenous, Continuous, [name], DO, Last Rate: 20 mL/hr at 06/23/21 0557, New Bag at 06/23/21 0557   acetaminophen (TYLENOL) tablet 1,000 mg, 1,000 mg, Oral, Q8H, 1,000 mg at 06/22/21 2132 **OR** acetaminophen (TYLENOL) suppository 650 mg, 650 mg, Rectal, Q8H, Griffith, Kelly A, DO   albuterol (PROVENTIL) (2.5 MG/3ML) 0.083% nebulizer solution 2.5 mg, 2.5 mg, Nebulization, Q6H, Garba, Mohammad L, MD, 2.5 mg at 06/23/21 0153   ALPRAZolam (XANAX) tablet 0.25 mg, 0.25 mg, Oral, TID PRN, Nicole Kindred A, DO, 0.25 mg at 06/22/21 2200   azithromycin (ZITHROMAX) tablet 500 mg, 500 mg, Oral, Daily,  Benita Gutter, RPH, 500 mg at 06/22/21 1937   budesonide (PULMICORT) nebulizer solution 0.25 mg, 0.25 mg, Nebulization, BID, Nicole Kindred A, DO, 0.25 mg at 06/22/21 2022   buPROPion [facility]) tablet 75 mg, 75 mg, Oral, BID, Nicole Kindred A, DO, 75 mg at 06/22/21 2132   cefTRIAXone (ROCEPHIN) 2 g in sodium chloride 0.9 % 100 mL IVPB, 2 g, Intravenous, Q24H, Harvest Dark, MD, Stopped at 06/22/21 2207   diphenhydrAMINE (BENADRYL) injection 12.5 mg, 12.5 mg, Intravenous, Q8H PRN, Nicole Kindred A, DO, 12.5 mg at 06/22/21 1703   docusate sodium (COLACE) capsule 100 mg, 100 mg, Oral, BID, [name], DO, 100 mg at 06/22/21 2132   escitalopram (LEXAPRO) tablet 20 mg, 20 mg, Oral, Daily, Nicole Kindred A, DO, 20 mg at 06/22/21 4270   furosemide (LASIX) tablet 40 mg, 40 mg, Oral, Daily, Jonelle Sidle, Mohammad L, MD, 40 mg at 06/22/21 0905   gabapentin (NEURONTIN) capsule 400 mg, 400 mg, Oral, TID, Nicole Kindred A, DO, 400 mg at 06/22/21 2132   guaiFENesin (MUCINEX) 12 hr tablet 600 mg, 600 mg, Oral, BID, Garba, Mohammad L, MD, 600 mg at 06/22/21 2132   HYDROcodone-acetaminophen (NORCO/VICODIN) 5-325 MG per tablet 1-2 tablet, 1-2 tablet, Oral, Q6H PRN, Nicole Kindred A, DO, 2 tablet at 06/22/21 2132   ipratropium (ATROVENT) nebulizer solution 0.5 mg, 0.5 mg, Nebulization, Q6H, Garba, Mohammad L, MD, 0.5 mg at 06/23/21 0153   lip balm (BLISTEX) ointment, , Topical, PRN, Madueme, Elvira C, RPH   methylPREDNISolone sodium succinate (SOLU-MEDROL) 40 mg/mL injection 40 mg, 40 mg, Intravenous, Q12H, Garba, Mohammad L, MD, 40 mg at 06/23/21 0131   metoprolol tartrate (LOPRESSOR) injection 5 mg, 5  mg, Intravenous, Q6H PRN, Jonelle Sidle, Mohammad L, MD   morphine (PF) 2 MG/ML injection 2 mg, 2 mg, Intravenous, Q2H PRN, Nicole Kindred A, DO, 2 mg at 06/23/21 0557   ondansetron (ZOFRAN) tablet 4 mg, 4 mg, Oral, Q6H PRN **OR** ondansetron (ZOFRAN) injection 4 mg, 4 mg, Intravenous, Q6H PRN, Jonelle Sidle,  Mohammad L, MD, 4 mg at 06/22/21 1355   oxyCODONE (Oxy IR/ROXICODONE) immediate release tablet 5-10 mg, 5-10 mg, Oral, Q4H PRN, Nicole Kindred A, DO, 5 mg at 06/22/21 1359   pantoprazole (PROTONIX) EC tablet 40 mg, 40 mg, Oral, BID, Nicole Kindred A, DO, 40 mg at 06/22/21 2132   polyethylene glycol (MIRALAX / GLYCOLAX) packet 17 g, 17 g, Oral, BID, [name], DO, 17 g at 06/22/21 2131   potassium chloride 10 mEq in 100 mL IVPB, 10 mEq, Intravenous, Once, Garba, Mohammad L, MD   QUEtiapine (SEROQUEL) tablet 25 mg, 25 mg, Oral, QHS, Griffith, Kelly A, DO, 25 mg at 06/22/21 2132   roflumilast (DALIRESP) tablet 500 mcg, 500 mcg, Oral, Daily, Nicole Kindred A, DO, 500 mcg at 06/22/21 0905  sodium chloride 20 mL/hr at 06/23/21 0557   cefTRIAXone (ROCEPHIN)  IV Stopped (06/22/21 2207)   potassium chloride      ALPRAZolam, diphenhydrAMINE, HYDROcodone-acetaminophen, lip balm, metoprolol tartrate, morphine injection, ondansetron **OR** ondansetron (ZOFRAN) IV, oxyCODONE   Objective    Vitals:   06/23/21 0016 06/23/21 0153 06/23/21 0512 06/23/21 0732  BP: 106/63  113/89 90/65  Pulse: 68  89 67  Resp: '18  20 18  '$ Temp: (!) 97.5 F (36.4 C)  (!) 97.5 F (36.4 C) 98.1 F (36.7 C)  TempSrc: Oral  Oral   SpO2: 99% 99% 98% 96%  Weight:      Height:         Physical Exam Vitals and nursing note reviewed.  Constitutional:      General: She is not in acute distress.    Appearance: Normal appearance. She is not ill-appearing, toxic-appearing or diaphoretic.  HENT:     Head: Normocephalic and atraumatic.     Nose: Nose normal.     Mouth/Throat:     Mouth: Mucous membranes are moist.     Pharynx: Oropharynx is clear.  Eyes:     General: No scleral icterus.    Extraocular Movements: Extraocular movements intact.  Cardiovascular:     Rate and Rhythm: Normal rate and regular rhythm.     Heart sounds: Normal heart sounds. No murmur heard.   No friction rub. No gallop.   Pulmonary:     Effort: Pulmonary effort is normal. No respiratory distress.     Breath sounds: No wheezing, rhonchi or rales.     Comments: Diminished bilaterally Abdominal:     General: There is no distension.     Palpations: Abdomen is soft.     Tenderness: There is abdominal tenderness (around ostomy site. non peritoneal, no rigidity). There is no guarding or rebound.     Comments: Hypoactive bowel sounds. Brown stool present in bag. Ostomy pink and viable.  Musculoskeletal:     Cervical back: Neck supple.     Right lower leg: No edema.     Left lower leg: No edema.  Skin:    General: Skin is warm and dry.     Coloration: Skin is not jaundiced or pale.  Neurological:     General: No focal deficit present.     Mental Status: She is alert and oriented to person, place,  and time. Mental status is at baseline.  Psychiatric:        Mood and Affect: Mood normal.        Behavior: Behavior normal.        Thought Content: Thought content normal.        Judgment: Judgment normal.     Laboratory Data Recent Labs  Lab 06/21/21 0117 06/22/21 0612 06/23/21 0540  WBC 12.8* 11.7* 8.7  HGB 12.4 11.9* 11.7*  HCT 41.4 39.3 38.2  PLT 317 347 354   Recent Labs  Lab 06/20/21 1756 06/21/21 0117 06/22/21 0612 06/23/21 0540  NA 138 142 135 133*  K 2.7* 3.7 3.6 4.0  CL 86* 90* 86* 81*  CO2 41* 39* 38* 42*  BUN '7 9 19 16  '$ CREATININE 0.74 0.75  0.68 0.74 0.55  CALCIUM 8.6* 8.6* 9.4 9.6  PROT 8.2* 8.2*  --   --   BILITOT 1.0 0.7  --   --   ALKPHOS 87 96  --   --   ALT 12 13  --   --   AST 18 17  --   --   GLUCOSE 117* 140* 117* 117*   No results for input(s): INR in the last 168 hours.    Imaging Studies: CT CHEST ABDOMEN PELVIS W CONTRAST  Result Date: 06/21/2021 CLINICAL DATA:  Unintended weight loss. Daily nausea and vomiting leading to weight loss. EXAM: CT CHEST, ABDOMEN, AND PELVIS WITH CONTRAST TECHNIQUE: Multidetector CT imaging of the chest, abdomen and pelvis was  performed following the standard protocol during bolus administration of intravenous contrast. RADIATION DOSE REDUCTION: This exam was performed according to the departmental dose-optimization program which includes automated exposure control, adjustment of the mA and/or kV according to patient size and/or use of iterative reconstruction technique. CONTRAST:  178m OMNIPAQUE IOHEXOL 300 MG/ML  SOLN COMPARISON:  Abdominopelvic CT 10/05/2020. Chest radiograph yesterday. Chest CT 06/05/2018 FINDINGS: CT CHEST FINDINGS Cardiovascular: Mild aortic atherosclerosis. There are coronary artery calcifications. The heart is normal in size. Exam not tailored to pulmonary artery assessment, no pulmonary embolus. No pericardial effusion. Mediastinum/Nodes: No enlarged mediastinal or hilar lymph nodes. No esophageal wall thickening. No thyroid nodule. Lungs/Pleura: Mild apical predominant emphysema. There are multiple calcified nodules consistent with prior granulomatous disease. Mild bronchial wall thickening. There are scattered ill-defined opacities, some of which are tree-in-bud within the lower lobes and right middle lobe, many of which are small, ill-defined and nodular in appearance. There is a basilar predominant distribution. No pleural fluid. Musculoskeletal: No focal bone lesion. No acute osseous finding. No obvious breast mass by CT. CT ABDOMEN PELVIS FINDINGS Hepatobiliary: No suspicious liver lesion. Occasional tiny subcentimeter hepatic hypodensities are too small to characterize and likely small cysts or hemangiomas. Cholecystectomy without biliary dilatation. Pancreas: No ductal dilatation or inflammation. Spleen: Normal in size without focal abnormality. Adrenals/Urinary Tract: No adrenal nodule. Punctate nonobstructing stone in the lower pole of the left kidney. No hydronephrosis. 12 mm low-density lesion in the lower right kidney, series 2, image 77, stable in size from 2019 exam and likely minimally complex  cyst. Additional tiny renal hypodensities are too small to characterize. No dedicated further follow-up is recommended. The urinary bladder is empty. Stomach/Bowel: Stomach is distended with enteric contrast. There is no gastric wall thickening. Administered enteric contrast reaches the mid-distal small bowel. No small bowel obstruction or inflammation. Low lying cecum in the pelvic midline. The appendix is not confidently visualized. Multifocal colonic diverticulosis. No diverticulitis or acute colonic  inflammation. Descending colostomy. Parastomal hernia contains nonobstructed loop of small bowel as well as portion of descending colon. Stapled off sigmoid colon in the pelvis. Vascular/Lymphatic: Aortic atherosclerosis. No aortic aneurysm. Portal vein is patent. No abdominopelvic adenopathy. Reproductive: Hysterectomy.  No adnexal mass. Other: Postsurgical change of the anterior abdominal wall with rectus diastasis. No ascites, free air or abdominopelvic collection. Musculoskeletal: Interbody spacers at L4-L5 and L5-S1. No acute osseous findings. Occasional bone islands. IMPRESSION: 1. Scattered ill-defined pulmonary opacities, some of which are tree-in-bud within the lower lobes and right middle lobe. Many of these opacities are small, ill-defined and nodular in appearance. Favor infection/inflammation, including aspiration in the setting of repeated vomiting. 2. Findings suspicious for malignancy. 3. Mild emphysema and bronchial thickening. 4. Nonobstructing left nephrolithiasis. 5. Descending colostomy with peristomal hernia containing small bowel. Colonic diverticulosis without diverticulitis. Aortic Atherosclerosis (ICD10-I70.0) and Emphysema (ICD10-J43.9). Electronically Signed   By: Keith Rake M.D.   On: 06/21/2021 22:02    Assessment:   # Anemia # Abdominal pain- around ostomy site # Abnormal weight loss # change in bowel habits with decreased output # phx polyps # s/p partial colon resection  with colostomy formation 2/2 complicated diverticulitis # nsaid use # Acute on chronic hypoxic respiratory failure with hypercapnia # COPD # HFpEF  Plan:  Plan for EGD today. Flex sig/ostomy evaluation planned for tomorrow pending patient prep NPO since midnight Continue BID ppi Avoid nsaids CT w/o signs of obstruction Continue bowel regimen to soften bowel movements for ostomy output Hold dvt ppx for procedures Pt will need ostomy and luminal evaluation as well as flex sig for remnant colon. Will require bowel prep and enema later today Discussed case with pulmonology who feels pt is safe enough for sedation   Esophagogastroduodenoscopy, Flexible sigmoidoscopy and ostomy/luminal evaluation with possible biopsy, control of bleeding, polypectomy, and interventions as necessary has been discussed with the patient/patient representative. Informed consent was obtained from the patient/patient representative after explaining the indication, nature, and risks of the procedure including but not limited to death, bleeding, perforation, missed neoplasm/lesions, cardiorespiratory compromise, and reaction to medications. Opportunity for questions was given and appropriate answers were provided. Patient/patient representative has verbalized understanding is amenable to undergoing the procedure.  I personally performed the service.  Management of other medical comorbidities as per primary team  Thank you for allowing [place] to participate in this patient's care. Please don't hesitate to call if any questions or concerns arise.   [name], DO [facility]  Portions of the record may have been created with voice recognition software. Occasional wrong-word or 'sound-a-like' substitutions may have occurred due to the inherent limitations of voice recognition software.  Read the chart carefully and recognize, using context, where substitutions may have occurred.

## 2021-06-23 NOTE — H&P (View-Only) (Signed)
Inpatient Follow-up/Progress Note   Patient ID: Misty Green is a 51 y.o. female.  Overnight Events / Subjective Findings NAEON. Resting comfortably in bed receiving breath treatments. Has some soft light brown stool in ostomy bag today. Does not believe there was gas coming out. Still with abdominal discomfort around ostomy area. No blood or melena. NPO since midnight. No other acute gi complaints.  Review of Systems  Constitutional:  Positive for unexpected weight change. Negative for activity change, appetite change, chills, diaphoresis, fatigue and fever.  HENT:  Negative for trouble swallowing and voice change.   Respiratory:  Negative for shortness of breath and wheezing.   Cardiovascular:  Negative for chest pain, palpitations and leg swelling.  Gastrointestinal:  Positive for abdominal pain and constipation. Negative for abdominal distention, anal bleeding, blood in stool, diarrhea, nausea, rectal pain and vomiting.  Musculoskeletal:  Negative for arthralgias and myalgias.  Skin:  Negative for color change and pallor.  Neurological:  Negative for dizziness, syncope and weakness.  Psychiatric/Behavioral:  Negative for confusion.   All other systems reviewed and are negative.   Medications  Current Facility-Administered Medications:    0.9 %  sodium chloride infusion, , Intravenous, Continuous, [name], DO, Last Rate: 20 mL/hr at 06/23/21 0557, New Bag at 06/23/21 0557   acetaminophen (TYLENOL) tablet 1,000 mg, 1,000 mg, Oral, Q8H, 1,000 mg at 06/22/21 2132 **OR** acetaminophen (TYLENOL) suppository 650 mg, 650 mg, Rectal, Q8H, Griffith, Kelly A, DO   albuterol (PROVENTIL) (2.5 MG/3ML) 0.083% nebulizer solution 2.5 mg, 2.5 mg, Nebulization, Q6H, Garba, Mohammad L, MD, 2.5 mg at 06/23/21 0153   ALPRAZolam (XANAX) tablet 0.25 mg, 0.25 mg, Oral, TID PRN, Nicole Kindred A, DO, 0.25 mg at 06/22/21 2200   azithromycin (ZITHROMAX) tablet 500 mg, 500 mg, Oral, Daily,  Benita Gutter, RPH, 500 mg at 06/22/21 1937   budesonide (PULMICORT) nebulizer solution 0.25 mg, 0.25 mg, Nebulization, BID, Nicole Kindred A, DO, 0.25 mg at 06/22/21 2022   buPROPion [facility]) tablet 75 mg, 75 mg, Oral, BID, Nicole Kindred A, DO, 75 mg at 06/22/21 2132   cefTRIAXone (ROCEPHIN) 2 g in sodium chloride 0.9 % 100 mL IVPB, 2 g, Intravenous, Q24H, Harvest Dark, MD, Stopped at 06/22/21 2207   diphenhydrAMINE (BENADRYL) injection 12.5 mg, 12.5 mg, Intravenous, Q8H PRN, Nicole Kindred A, DO, 12.5 mg at 06/22/21 1703   docusate sodium (COLACE) capsule 100 mg, 100 mg, Oral, BID, [name], DO, 100 mg at 06/22/21 2132   escitalopram (LEXAPRO) tablet 20 mg, 20 mg, Oral, Daily, Nicole Kindred A, DO, 20 mg at 06/22/21 9562   furosemide (LASIX) tablet 40 mg, 40 mg, Oral, Daily, Jonelle Sidle, Mohammad L, MD, 40 mg at 06/22/21 0905   gabapentin (NEURONTIN) capsule 400 mg, 400 mg, Oral, TID, Nicole Kindred A, DO, 400 mg at 06/22/21 2132   guaiFENesin (MUCINEX) 12 hr tablet 600 mg, 600 mg, Oral, BID, Garba, Mohammad L, MD, 600 mg at 06/22/21 2132   HYDROcodone-acetaminophen (NORCO/VICODIN) 5-325 MG per tablet 1-2 tablet, 1-2 tablet, Oral, Q6H PRN, Nicole Kindred A, DO, 2 tablet at 06/22/21 2132   ipratropium (ATROVENT) nebulizer solution 0.5 mg, 0.5 mg, Nebulization, Q6H, Garba, Mohammad L, MD, 0.5 mg at 06/23/21 0153   lip balm (BLISTEX) ointment, , Topical, PRN, Madueme, Elvira C, RPH   methylPREDNISolone sodium succinate (SOLU-MEDROL) 40 mg/mL injection 40 mg, 40 mg, Intravenous, Q12H, Garba, Mohammad L, MD, 40 mg at 06/23/21 0131   metoprolol tartrate (LOPRESSOR) injection 5 mg, 5  mg, Intravenous, Q6H PRN, Jonelle Sidle, Mohammad L, MD   morphine (PF) 2 MG/ML injection 2 mg, 2 mg, Intravenous, Q2H PRN, Nicole Kindred A, DO, 2 mg at 06/23/21 0557   ondansetron (ZOFRAN) tablet 4 mg, 4 mg, Oral, Q6H PRN **OR** ondansetron (ZOFRAN) injection 4 mg, 4 mg, Intravenous, Q6H PRN, Jonelle Sidle,  Mohammad L, MD, 4 mg at 06/22/21 1355   oxyCODONE (Oxy IR/ROXICODONE) immediate release tablet 5-10 mg, 5-10 mg, Oral, Q4H PRN, Nicole Kindred A, DO, 5 mg at 06/22/21 1359   pantoprazole (PROTONIX) EC tablet 40 mg, 40 mg, Oral, BID, Nicole Kindred A, DO, 40 mg at 06/22/21 2132   polyethylene glycol (MIRALAX / GLYCOLAX) packet 17 g, 17 g, Oral, BID, [name], DO, 17 g at 06/22/21 2131   potassium chloride 10 mEq in 100 mL IVPB, 10 mEq, Intravenous, Once, Garba, Mohammad L, MD   QUEtiapine (SEROQUEL) tablet 25 mg, 25 mg, Oral, QHS, Griffith, Kelly A, DO, 25 mg at 06/22/21 2132   roflumilast (DALIRESP) tablet 500 mcg, 500 mcg, Oral, Daily, Nicole Kindred A, DO, 500 mcg at 06/22/21 0905  sodium chloride 20 mL/hr at 06/23/21 0557   cefTRIAXone (ROCEPHIN)  IV Stopped (06/22/21 2207)   potassium chloride      ALPRAZolam, diphenhydrAMINE, HYDROcodone-acetaminophen, lip balm, metoprolol tartrate, morphine injection, ondansetron **OR** ondansetron (ZOFRAN) IV, oxyCODONE   Objective    Vitals:   06/23/21 0016 06/23/21 0153 06/23/21 0512 06/23/21 0732  BP: 106/63  113/89 90/65  Pulse: 68  89 67  Resp: '18  20 18  '$ Temp: (!) 97.5 F (36.4 C)  (!) 97.5 F (36.4 C) 98.1 F (36.7 C)  TempSrc: Oral  Oral   SpO2: 99% 99% 98% 96%  Weight:      Height:         Physical Exam Vitals and nursing note reviewed.  Constitutional:      General: She is not in acute distress.    Appearance: Normal appearance. She is not ill-appearing, toxic-appearing or diaphoretic.  HENT:     Head: Normocephalic and atraumatic.     Nose: Nose normal.     Mouth/Throat:     Mouth: Mucous membranes are moist.     Pharynx: Oropharynx is clear.  Eyes:     General: No scleral icterus.    Extraocular Movements: Extraocular movements intact.  Cardiovascular:     Rate and Rhythm: Normal rate and regular rhythm.     Heart sounds: Normal heart sounds. No murmur heard.   No friction rub. No gallop.   Pulmonary:     Effort: Pulmonary effort is normal. No respiratory distress.     Breath sounds: No wheezing, rhonchi or rales.     Comments: Diminished bilaterally Abdominal:     General: There is no distension.     Palpations: Abdomen is soft.     Tenderness: There is abdominal tenderness (around ostomy site. non peritoneal, no rigidity). There is no guarding or rebound.     Comments: Hypoactive bowel sounds. Brown stool present in bag. Ostomy pink and viable.  Musculoskeletal:     Cervical back: Neck supple.     Right lower leg: No edema.     Left lower leg: No edema.  Skin:    General: Skin is warm and dry.     Coloration: Skin is not jaundiced or pale.  Neurological:     General: No focal deficit present.     Mental Status: She is alert and oriented to person, place,  and time. Mental status is at baseline.  Psychiatric:        Mood and Affect: Mood normal.        Behavior: Behavior normal.        Thought Content: Thought content normal.        Judgment: Judgment normal.     Laboratory Data Recent Labs  Lab 06/21/21 0117 06/22/21 0612 06/23/21 0540  WBC 12.8* 11.7* 8.7  HGB 12.4 11.9* 11.7*  HCT 41.4 39.3 38.2  PLT 317 347 354   Recent Labs  Lab 06/20/21 1756 06/21/21 0117 06/22/21 0612 06/23/21 0540  NA 138 142 135 133*  K 2.7* 3.7 3.6 4.0  CL 86* 90* 86* 81*  CO2 41* 39* 38* 42*  BUN '7 9 19 16  '$ CREATININE 0.74 0.75  0.68 0.74 0.55  CALCIUM 8.6* 8.6* 9.4 9.6  PROT 8.2* 8.2*  --   --   BILITOT 1.0 0.7  --   --   ALKPHOS 87 96  --   --   ALT 12 13  --   --   AST 18 17  --   --   GLUCOSE 117* 140* 117* 117*   No results for input(s): INR in the last 168 hours.    Imaging Studies: CT CHEST ABDOMEN PELVIS W CONTRAST  Result Date: 06/21/2021 CLINICAL DATA:  Unintended weight loss. Daily nausea and vomiting leading to weight loss. EXAM: CT CHEST, ABDOMEN, AND PELVIS WITH CONTRAST TECHNIQUE: Multidetector CT imaging of the chest, abdomen and pelvis was  performed following the standard protocol during bolus administration of intravenous contrast. RADIATION DOSE REDUCTION: This exam was performed according to the departmental dose-optimization program which includes automated exposure control, adjustment of the mA and/or kV according to patient size and/or use of iterative reconstruction technique. CONTRAST:  137m OMNIPAQUE IOHEXOL 300 MG/ML  SOLN COMPARISON:  Abdominopelvic CT 10/05/2020. Chest radiograph yesterday. Chest CT 06/05/2018 FINDINGS: CT CHEST FINDINGS Cardiovascular: Mild aortic atherosclerosis. There are coronary artery calcifications. The heart is normal in size. Exam not tailored to pulmonary artery assessment, no pulmonary embolus. No pericardial effusion. Mediastinum/Nodes: No enlarged mediastinal or hilar lymph nodes. No esophageal wall thickening. No thyroid nodule. Lungs/Pleura: Mild apical predominant emphysema. There are multiple calcified nodules consistent with prior granulomatous disease. Mild bronchial wall thickening. There are scattered ill-defined opacities, some of which are tree-in-bud within the lower lobes and right middle lobe, many of which are small, ill-defined and nodular in appearance. There is a basilar predominant distribution. No pleural fluid. Musculoskeletal: No focal bone lesion. No acute osseous finding. No obvious breast mass by CT. CT ABDOMEN PELVIS FINDINGS Hepatobiliary: No suspicious liver lesion. Occasional tiny subcentimeter hepatic hypodensities are too small to characterize and likely small cysts or hemangiomas. Cholecystectomy without biliary dilatation. Pancreas: No ductal dilatation or inflammation. Spleen: Normal in size without focal abnormality. Adrenals/Urinary Tract: No adrenal nodule. Punctate nonobstructing stone in the lower pole of the left kidney. No hydronephrosis. 12 mm low-density lesion in the lower right kidney, series 2, image 77, stable in size from 2019 exam and likely minimally complex  cyst. Additional tiny renal hypodensities are too small to characterize. No dedicated further follow-up is recommended. The urinary bladder is empty. Stomach/Bowel: Stomach is distended with enteric contrast. There is no gastric wall thickening. Administered enteric contrast reaches the mid-distal small bowel. No small bowel obstruction or inflammation. Low lying cecum in the pelvic midline. The appendix is not confidently visualized. Multifocal colonic diverticulosis. No diverticulitis or acute colonic  inflammation. Descending colostomy. Parastomal hernia contains nonobstructed loop of small bowel as well as portion of descending colon. Stapled off sigmoid colon in the pelvis. Vascular/Lymphatic: Aortic atherosclerosis. No aortic aneurysm. Portal vein is patent. No abdominopelvic adenopathy. Reproductive: Hysterectomy.  No adnexal mass. Other: Postsurgical change of the anterior abdominal wall with rectus diastasis. No ascites, free air or abdominopelvic collection. Musculoskeletal: Interbody spacers at L4-L5 and L5-S1. No acute osseous findings. Occasional bone islands. IMPRESSION: 1. Scattered ill-defined pulmonary opacities, some of which are tree-in-bud within the lower lobes and right middle lobe. Many of these opacities are small, ill-defined and nodular in appearance. Favor infection/inflammation, including aspiration in the setting of repeated vomiting. 2. Findings suspicious for malignancy. 3. Mild emphysema and bronchial thickening. 4. Nonobstructing left nephrolithiasis. 5. Descending colostomy with peristomal hernia containing small bowel. Colonic diverticulosis without diverticulitis. Aortic Atherosclerosis (ICD10-I70.0) and Emphysema (ICD10-J43.9). Electronically Signed   By: Keith Rake M.D.   On: 06/21/2021 22:02    Assessment:   # Anemia # Abdominal pain- around ostomy site # Abnormal weight loss # change in bowel habits with decreased output # phx polyps # s/p partial colon resection  with colostomy formation 2/2 complicated diverticulitis # nsaid use # Acute on chronic hypoxic respiratory failure with hypercapnia # COPD # HFpEF  Plan:  Plan for EGD today. Flex sig/ostomy evaluation planned for tomorrow pending patient prep NPO since midnight Continue BID ppi Avoid nsaids CT w/o signs of obstruction Continue bowel regimen to soften bowel movements for ostomy output Hold dvt ppx for procedures Pt will need ostomy and luminal evaluation as well as flex sig for remnant colon. Will require bowel prep and enema later today Discussed case with pulmonology who feels pt is safe enough for sedation   Esophagogastroduodenoscopy, Flexible sigmoidoscopy and ostomy/luminal evaluation with possible biopsy, control of bleeding, polypectomy, and interventions as necessary has been discussed with the patient/patient representative. Informed consent was obtained from the patient/patient representative after explaining the indication, nature, and risks of the procedure including but not limited to death, bleeding, perforation, missed neoplasm/lesions, cardiorespiratory compromise, and reaction to medications. Opportunity for questions was given and appropriate answers were provided. Patient/patient representative has verbalized understanding is amenable to undergoing the procedure.  I personally performed the service.  Management of other medical comorbidities as per primary team  Thank you for allowing [place] to participate in this patient's care. Please don't hesitate to call if any questions or concerns arise.   [name], DO [facility]  Portions of the record may have been created with voice recognition software. Occasional wrong-word or 'sound-a-like' substitutions may have occurred due to the inherent limitations of voice recognition software.  Read the chart carefully and recognize, using context, where substitutions may have occurred.

## 2021-06-23 NOTE — Anesthesia Preprocedure Evaluation (Signed)
Anesthesia Evaluation  Patient identified by MRN, date of birth, ID band Patient awake    Reviewed: Allergy & Precautions, H&P , NPO status , Patient's Chart, lab work & pertinent test results, reviewed documented beta blocker date and time   Airway Mallampati: II   Neck ROM: full    Dental  (+) Poor Dentition   Pulmonary shortness of breath, sleep apnea and Continuous Positive Airway Pressure Ventilation , pneumonia, COPD, former smoker,    Pulmonary exam normal        Cardiovascular Exercise Tolerance: Poor hypertension, On Medications +CHF  Normal cardiovascular exam Rhythm:regular Rate:Normal     Neuro/Psych PSYCHIATRIC DISORDERS Anxiety Depression negative neurological ROS     GI/Hepatic Neg liver ROS, GERD  ,  Endo/Other  negative endocrine ROS  Renal/GU Renal disease  negative genitourinary   Musculoskeletal   Abdominal   Peds  Hematology  (+) Blood dyscrasia, anemia ,   Anesthesia Other Findings Past Medical History: 05/12/2018: Cardiac arrest ([place]) No date: CHF (congestive heart failure) (HCC) No date: Chicken pox No date: COPD (chronic obstructive pulmonary disease) (HCC) No date: Current smoker No date: Dyspnea No date: GERD (gastroesophageal reflux disease) No date: Hypertension No date: Malfunction of colostomy and enterostomy ([facility]CC) No date: Opiate abuse, continuous (HCC) No date: Oxygen deficit No date: Sleep apnea Past Surgical History: No date: ABDOMINAL HYSTERECTOMY No date: BACK SURGERY No date: cesction 01/13/2018: COLOSTOMY     Comment:  Procedure: COLOSTOMY Creation;  Surgeon: Jules Husbands,              MD;  Location: [facility] ORS;  Service: General;; No date: HERNIA REPAIR No date: INCISIONAL HERNIA REPAIR     Comment:  lower midline laparotomy incision (hysterectomy),               repaired with mesh 05/26/2018: IR FLUORO GUIDE CV LINE RIGHT 06/02/2018: IR REMOVAL TUN CV CATH W/O  FL 05/26/2018: IR US GUIDE VASC ACCESS RIGHT 2012: LAPAROSCOPIC CHOLECYSTECTOMY 01/13/2018: LAPAROSCOPIC LYSIS OF ADHESIONS     Comment:  Procedure: LAPAROSCOPIC LYSIS OF ADHESIONS;  Surgeon:               Jules Husbands, MD;  Location: [facility] ORS;  Service:               General;; 01/13/2018: LAPAROSCOPIC SIGMOID COLECTOMY     Comment:  Procedure: LAPAROSCOPIC SIGMOID COLECTOMY;  Surgeon:               Jules Husbands, MD;  Location: [facility] ORS;  Service:               General;; No date: LUMBAR SPINE SURGERY 08/25/2015: ORIF WRIST FRACTURE; Right     Comment:  Procedure: OPEN REDUCTION INTERNAL FIXATION (ORIF) WRIST              FRACTURE;  Surgeon: Corky Mull, MD;  Location: [facility]               ORS;  Service: Orthopedics;  Laterality: Right; No date: OVARIAN CYST REMOVAL BMI    Body Mass Index: 30.88 kg/m     Reproductive/Obstetrics negative OB ROS                             Anesthesia Physical Anesthesia Plan  ASA: 3  Anesthesia Plan: General   Post-op Pain Management:    Induction:   PONV Risk Score and Plan:   Airway  Management Planned:   Additional Equipment:   Intra-op Plan:   Post-operative Plan:   Informed Consent: I have reviewed the patients History and Physical, chart, labs and discussed the procedure including the risks, benefits and alternatives for the proposed anesthesia with the patient or authorized representative who has indicated his/her understanding and acceptance.     Dental Advisory Given  Plan Discussed with: CRNA  Anesthesia Plan Comments:         Anesthesia Quick Evaluation

## 2021-06-24 ENCOUNTER — Inpatient Hospital Stay: Payer: 59 | Admitting: Anesthesiology

## 2021-06-24 ENCOUNTER — Encounter: Payer: Self-pay | Admitting: Gastroenterology

## 2021-06-24 ENCOUNTER — Encounter
Admission: EM | Disposition: A | Discharge status: Home / Self Care | Payer: Self-pay | Source: Home / Self Care | Attending: Internal Medicine

## 2021-06-24 DIAGNOSIS — R634 Abnormal weight loss: Secondary | ICD-10-CM

## 2021-06-24 DIAGNOSIS — J9601 Acute respiratory failure with hypoxia: Secondary | ICD-10-CM

## 2021-06-24 DIAGNOSIS — E876 Hypokalemia: Secondary | ICD-10-CM

## 2021-06-24 DIAGNOSIS — R112 Nausea with vomiting, unspecified: Secondary | ICD-10-CM

## 2021-06-24 DIAGNOSIS — J441 Chronic obstructive pulmonary disease with (acute) exacerbation: Secondary | ICD-10-CM

## 2021-06-24 HISTORY — PX: COLONOSCOPY WITH PROPOFOL: SHX5780

## 2021-06-24 LAB — CBC
HCT: 37.6 % (ref 36.0–46.0)
Hemoglobin: 11.3 g/dL — ABNORMAL LOW (ref 12.0–15.0)
MCH: 28.6 pg (ref 26.0–34.0)
MCHC: 30.1 g/dL (ref 30.0–36.0)
MCV: 95.2 fL (ref 80.0–100.0)
Platelets: 342 10*3/uL (ref 150–400)
RBC: 3.95 MIL/uL (ref 3.87–5.11)
RDW: 12.9 % (ref 11.5–15.5)
WBC: 8.3 10*3/uL (ref 4.0–10.5)
nRBC: 0 % (ref 0.0–0.2)

## 2021-06-24 LAB — BASIC METABOLIC PANEL
Anion gap: 7 (ref 5–15)
BUN: 12 mg/dL (ref 6–20)
CO2: 42 mmol/L — ABNORMAL HIGH (ref 22–32)
Calcium: 9 mg/dL (ref 8.9–10.3)
Chloride: 93 mmol/L — ABNORMAL LOW (ref 98–111)
Creatinine, Ser: 0.61 mg/dL (ref 0.44–1.00)
GFR, Estimated: >60 mL/min (ref >60)
Glucose, Bld: 90 mg/dL (ref 70–99)
Potassium: 3.9 mmol/L (ref 3.5–5.1)
Sodium: 142 mmol/L (ref 135–145)

## 2021-06-24 SURGERY — COLONOSCOPY WITH PROPOFOL
Anesthesia: General

## 2021-06-24 MED ORDER — PROPOFOL 500 MG/50ML IV EMUL
INTRAVENOUS | Status: DC | PRN
  Administered 2021-06-24: 145 ug/kg/min via INTRAVENOUS

## 2021-06-24 MED ORDER — ALPRAZOLAM 0.25 MG PO TABS: 0.2500 mg | ORAL_TABLET | Freq: Three times a day (TID) | ORAL | 0 refills | Status: DC | PRN

## 2021-06-24 MED ORDER — ESCITALOPRAM OXALATE 20 MG PO TABS: ORAL_TABLET | ORAL | 0 refills | Status: AC

## 2021-06-24 MED ORDER — ALBUTEROL SULFATE HFA 108 (90 BASE) MCG/ACT IN AERS: 2.0000 | INHALATION_SPRAY | Freq: Four times a day (QID) | RESPIRATORY_TRACT | 0 refills | Status: DC | PRN

## 2021-06-24 MED ORDER — PROPOFOL 10 MG/ML IV BOLUS
INTRAVENOUS | Status: DC | PRN
  Administered 2021-06-24: 80 mg via INTRAVENOUS
  Administered 2021-06-24: 20 mg via INTRAVENOUS
  Administered 2021-06-24 (×2): 50 mg via INTRAVENOUS

## 2021-06-24 MED ORDER — POTASSIUM CHLORIDE CRYS ER 20 MEQ PO TBCR: 20.0000 meq | EXTENDED_RELEASE_TABLET | Freq: Every day | ORAL | 0 refills | Status: AC

## 2021-06-24 MED ORDER — PROPOFOL 10 MG/ML IV BOLUS
INTRAVENOUS | Status: AC
  Filled 2021-06-24: qty 20

## 2021-06-24 MED ORDER — SODIUM CHLORIDE 0.9 % IV SOLN: INTRAVENOUS | Status: DC

## 2021-06-24 MED ORDER — SODIUM CHLORIDE 0.9 % IV SOLN
2.0000 g | Freq: Once | INTRAVENOUS | Status: AC
  Administered 2021-06-24: 2 g via INTRAVENOUS
  Filled 2021-06-24: qty 20

## 2021-06-24 MED ORDER — PREDNISONE 10 MG PO TABS: ORAL_TABLET | ORAL | 0 refills | Status: DC

## 2021-06-24 MED ORDER — OXYCODONE HCL 5 MG PO TABS: 5.0000 mg | ORAL_TABLET | Freq: Four times a day (QID) | ORAL | 0 refills | Status: DC | PRN

## 2021-06-24 NOTE — Op Note (Signed)
[facility] Patient Name: Misty Green Procedure Date: 06/24/2021 12:51 PM MRN: [id number] Account #: [id number] Date of Birth: 07-29-1970 Admit Type: Inpatient Age: 51 Room: [facility] ENDO ROOM 4 Gender: Female Note Status: Finalized Instrument Name: Peds Colonoscope [id number] Procedure:             Colonoscopy Indications:           Weight loss Providers:             [name] DO, DO Referring MD:          [name], MD (Referring MD) Medicines:             Monitored Anesthesia Care Complications:         No immediate complications. Estimated blood loss:                         Minimal. Procedure:             Pre-Anesthesia Assessment:                        - Prior to the procedure, a History and Physical was                         performed, and patient medications and allergies were                         reviewed. The patient is competent. The risks and                         benefits of the procedure and the sedation options and                         risks were discussed with the patient. All questions                         were answered and informed consent was obtained.                         Patient identification and proposed procedure were                         verified by the physician, the nurse, the anesthetist                         and the technician in the endoscopy suite. Mental                         Status Examination: alert and oriented. Airway                         Examination: normal oropharyngeal airway and neck                         mobility. Respiratory Examination: clear to                         auscultation. CV Examination: regular rate and rhythm  and systolic murmur. Prophylactic Antibiotics: The                         patient does not require prophylactic antibiotics.                         Prior Anticoagulants: The patient has taken no                          previous anticoagulant or antiplatelet agents. ASA                         Grade Assessment: III - A patient with severe systemic                         disease. After reviewing the risks and benefits, the                         patient was deemed in satisfactory condition to                         undergo the procedure. The anesthesia plan was to use                         monitored anesthesia care (MAC). Immediately prior to                         administration of medications, the patient was                         re-assessed for adequacy to receive sedatives. The                         heart rate, respiratory rate, oxygen saturations,                         blood pressure, adequacy of pulmonary ventilation, and                         response to care were monitored throughout the                         procedure. The physical status of the patient was                         re-assessed after the procedure.                        After obtaining informed consent, the colonoscope was                         passed under direct vision. Throughout the procedure,                         the patient's blood pressure, pulse, and oxygen                         saturations were monitored continuously. The  Colonoscope was introduced through the anus to the                         sigmoid stump (~17cm from anus). The scope was then                         withdrawn and then the Colonoscope was introduced                         through the descending colostomy and advanced to the                         the cecum, identified by appendiceal orifice and                         ileocecal valve. The colonoscopy was performed without                         difficulty. The patient tolerated the procedure well.                         The quality of the bowel preparation was fair. Findings:      The perianal and digital rectal examinations were normal. Pertinent        negatives include normal sphincter tone.      Two sessile polyps were found in the rectum. The polyps were 2 to 3 mm       in size. These polyps were removed with a cold snare. Resection and       retrieval were complete. Estimated blood loss was minimal.      A small polyp was found in the rectum. The polyp was sessile. The polyp       was removed with a jumbo cold forceps. Resection and retrieval were       complete. Estimated blood loss was minimal.      Localized mild inflammation characterized by erythema was found in the       rectum. Estimated blood loss: none.      Three pedunculated polyps were found in the transverse colon. The polyps       were 7 to 10 mm in size. These polyps were removed with a hot snare.       Resection and retrieval were complete. Estimated blood loss was minimal.      Other small polyps noted, but due to inadequate prep, unable to safely       remove as stool would cover and unable to be suctioned. unable to       retroflex in rectum due to scar tissue      Multiple small-mouthed diverticula were found in the descending colon,       transverse colon and ascending colon. Estimated blood loss: none. Impression:            - Preparation of the colon was fair.                        - Two 2 to 3 mm polyps in the rectum, removed with a                         cold snare. Resected and retrieved.                        -  One small polyp in the rectum, removed with a jumbo                         cold forceps. Resected and retrieved.                        - Localized mild inflammation was found in the rectum                         secondary to diversion colitis.                        - Three 7 to 10 mm polyps in the transverse colon,                         removed with a hot snare. Resected and retrieved.                        - Diverticulosis in the descending colon, in the                         transverse colon and in the ascending  colon. Recommendation:        - Return patient to hospital ward for ongoing care.                        - Resume previous diet.                        - No aspirin, ibuprofen, naproxen, or other                         non-steroidal anti-inflammatory drugs for 5 days after                         polyp removal.                        - Continue present medications.                        - Repeat colonoscopy in 6 months because the bowel                         preparation was poor.                        - Return to GI office as previously scheduled.                        - The findings and recommendations were discussed with                         the patient.                        - The findings and recommendations were discussed with                         the referring physician. Procedure Code(s):     --- Professional ---  [phone], Colonoscopy through stoma; with removal of                         tumor(s), polyp(s), or other lesion(s) by snare                         technique                        [id number], 7, Colonoscopy through stoma; with biopsy,                         single or multiple Diagnosis Code(s):     --- Professional ---                        K62.1, Rectal polyp                        K63.5, Polyp of colon                        K52.89, Other specified noninfective gastroenteritis                         and colitis                        R63.4, Abnormal weight loss                        K57.30, Diverticulosis of large intestine without                         perforation or abscess without bleeding CPT copyright 2019 American Medical Association. All rights reserved. The codes documented in this report are preliminary and upon coder review may  be revised to meet current compliance requirements. Attending Participation:      I personally performed the entire procedure. Volney American, DO [name] DO, DO 06/24/2021 2:21:43  PM This report has been signed electronically. Number of Addenda: 0 Note Initiated On: 06/24/2021 12:51 PM Scope Withdrawal Time: 0 hours 19 minutes 19 seconds  Total Procedure Duration: 1 hour 0 minutes 53 seconds  Estimated Blood Loss:  Estimated blood loss was minimal.      [facility]

## 2021-06-24 NOTE — Plan of Care (Signed)
Patient discharged home with spouse via private vehicle with home o2 and AVS.  Problem: Education: Goal: Knowledge of General Education information will improve Description: Including pain rating scale, medication(s)/side effects and non-pharmacologic comfort measures Outcome: Adequate for Discharge   Problem: Health Behavior/Discharge Planning: Goal: Ability to manage health-related needs will improve Outcome: Adequate for Discharge   Problem: Clinical Measurements: Goal: Ability to maintain clinical measurements within normal limits will improve Outcome: Adequate for Discharge Goal: Will remain free from infection Outcome: Adequate for Discharge Goal: Diagnostic test results will improve Outcome: Adequate for Discharge Goal: Respiratory complications will improve Outcome: Adequate for Discharge Goal: Cardiovascular complication will be avoided Outcome: Adequate for Discharge   Problem: Activity: Goal: Risk for activity intolerance will decrease Outcome: Adequate for Discharge   Problem: Nutrition: Goal: Adequate nutrition will be maintained Outcome: Adequate for Discharge   Problem: Coping: Goal: Level of anxiety will decrease Outcome: Adequate for Discharge   Problem: Elimination: Goal: Will not experience complications related to bowel motility Outcome: Adequate for Discharge Goal: Will not experience complications related to urinary retention Outcome: Adequate for Discharge   Problem: Pain Managment: Goal: General experience of comfort will improve Outcome: Adequate for Discharge   Problem: Safety: Goal: Ability to remain free from injury will improve Outcome: Adequate for Discharge   Problem: Skin Integrity: Goal: Risk for impaired skin integrity will decrease Outcome: Adequate for Discharge   Problem: Education: Goal: Ability to demonstrate management of disease process will improve Outcome: Adequate for Discharge Goal: Ability to verbalize understanding  of medication therapies will improve Outcome: Adequate for Discharge Goal: Individualized Educational Video(s) Outcome: Adequate for Discharge   Problem: Activity: Goal: Capacity to carry out activities will improve Outcome: Adequate for Discharge   Problem: Cardiac: Goal: Ability to achieve and maintain adequate cardiopulmonary perfusion will improve Outcome: Adequate for Discharge

## 2021-06-24 NOTE — Anesthesia Preprocedure Evaluation (Addendum)
Anesthesia Evaluation  Patient identified by MRN, date of birth, ID band Patient awake    Reviewed: Allergy & Precautions, H&P , NPO status , Patient's Chart, lab work & pertinent test results, reviewed documented beta blocker date and time   Airway Mallampati: III   Neck ROM: full    Dental  (+) Poor Dentition, Missing   Pulmonary shortness of breath, with exertion and at rest, sleep apnea and Continuous Positive Airway Pressure Ventilation , COPD,  COPD inhaler and oxygen dependent, former smoker,  bronchitis  [place] supplemental oxygen   (-) wheezing      Cardiovascular Exercise Tolerance: Poor hypertension, On Medications +CHF and + DOE   Rhythm:regular Rate:Normal + Systolic murmurs    Neuro/Psych PSYCHIATRIC DISORDERS Anxiety Depression negative neurological ROS     GI/Hepatic Neg liver ROS, Gastritis on EGD 06/23/21   Endo/Other  negative endocrine ROS  Renal/GU Renal disease  negative genitourinary   Musculoskeletal   Abdominal (+) + obese,   Peds  Hematology  (+) Blood dyscrasia, anemia ,   Anesthesia Other Findings Past Medical History: 05/12/2018: Cardiac arrest ([place]) No date: CHF (congestive heart failure) (HCC) No date: Chicken pox No date: COPD (chronic obstructive pulmonary disease) (HCC) No date: Current smoker No date: Dyspnea No date: GERD (gastroesophageal reflux disease) No date: Hypertension No date: Malfunction of colostomy and enterostomy ([place]) No date: Opiate abuse, continuous ([place]) No date: Oxygen deficit No date: Sleep apnea Past Surgical History: No date: ABDOMINAL HYSTERECTOMY No date: BACK SURGERY No date: cesction 01/13/2018: COLOSTOMY     Comment:  Procedure: COLOSTOMY Creation;  Surgeon: Jules Husbands,              MD;  Location: [facility] ORS;  Service: General;; No date: HERNIA REPAIR No date: INCISIONAL HERNIA REPAIR     Comment:  lower midline laparotomy incision  (hysterectomy),               repaired with mesh 05/26/2018: IR FLUORO GUIDE CV LINE RIGHT 06/02/2018: IR REMOVAL TUN CV CATH W/O FL 05/26/2018: IR US GUIDE VASC ACCESS RIGHT 2012: LAPAROSCOPIC CHOLECYSTECTOMY 01/13/2018: LAPAROSCOPIC LYSIS OF ADHESIONS     Comment:  Procedure: LAPAROSCOPIC LYSIS OF ADHESIONS;  Surgeon:               Jules Husbands, MD;  Location: [facility] ORS;  Service:               General;; 01/13/2018: LAPAROSCOPIC SIGMOID COLECTOMY     Comment:  Procedure: LAPAROSCOPIC SIGMOID COLECTOMY;  Surgeon:               Jules Husbands, MD;  Location: [facility] ORS;  Service:               General;; No date: LUMBAR SPINE SURGERY 08/25/2015: ORIF WRIST FRACTURE; Right     Comment:  Procedure: OPEN REDUCTION INTERNAL FIXATION (ORIF) WRIST              FRACTURE;  Surgeon: Corky Mull, MD;  Location: [facility]               ORS;  Service: Orthopedics;  Laterality: Right; No date: OVARIAN CYST REMOVAL BMI    Body Mass Index: 30.88 kg/m     Reproductive/Obstetrics negative OB ROS                           Anesthesia Physical  Anesthesia Plan  ASA: 3  Anesthesia Plan:  General   Post-op Pain Management:    Induction: Intravenous  PONV Risk Score and Plan:   Airway Management Planned: Natural Airway and Simple Face Mask  Additional Equipment:   Intra-op Plan:   Post-operative Plan:   Informed Consent: I have reviewed the patients History and Physical, chart, labs and discussed the procedure including the risks, benefits and alternatives for the proposed anesthesia with the patient or authorized representative who has indicated his/her understanding and acceptance.     Dental Advisory Given  Plan Discussed with: CRNA  Anesthesia Plan Comments:        Anesthesia Quick Evaluation

## 2021-06-24 NOTE — H&P (View-Only) (Signed)
Inpatient Follow-up/Progress Note   Patient ID: Misty Green is a 51 y.o. female.  Overnight Events / Subjective Findings NAEON. Seen in endoscopy, completed 1 enema and Golytely prep for endoscopy today. Noted abdominal tenderness improved with passage of bms. Some blood noted after enema via anus. NPO since midnight No other acute gi complaints.  Review of Systems  Constitutional:  Positive for unexpected weight change. Negative for activity change, appetite change, chills, diaphoresis, fatigue and fever.  HENT:  Negative for trouble swallowing and voice change.   Respiratory:  Negative for shortness of breath and wheezing.   Cardiovascular:  Negative for chest pain, palpitations and leg swelling.  Gastrointestinal:  Positive for abdominal pain and constipation. Negative for abdominal distention, anal bleeding, blood in stool, diarrhea, nausea, rectal pain and vomiting.  Musculoskeletal:  Negative for arthralgias and myalgias.  Skin:  Negative for color change and pallor.  Neurological:  Negative for dizziness, syncope and weakness.  Psychiatric/Behavioral:  Negative for confusion.   All other systems reviewed and are negative.   Medications  Current Facility-Administered Medications:    [MAR Hold] acetaminophen (TYLENOL) tablet 1,000 mg, 1,000 mg, Oral, Q8H, 1,000 mg at 06/23/21 2100 **OR** [MAR Hold] acetaminophen (TYLENOL) suppository 650 mg, 650 mg, Rectal, Q8H, Ezekiel Slocumb, DO   [MAR Hold] ALPRAZolam Duanne Moron) tablet 0.25 mg, 0.25 mg, Oral, TID PRN, Nicole Kindred A, DO, 0.25 mg at 06/23/21 2214   [MAR Hold] azithromycin (ZITHROMAX) tablet 500 mg, 500 mg, Oral, Daily, Benita Gutter, RPH, 500 mg at 06/23/21 2100   [MAR Hold] budesonide (PULMICORT) nebulizer solution 0.25 mg, 0.25 mg, Nebulization, BID, Nicole Kindred A, DO, 0.25 mg at 06/24/21 0833   [MAR Hold] buPROPion (WELLBUTRIN) tablet 75 mg, 75 mg, Oral, BID, Nicole Kindred A, DO, 75 mg at 06/24/21 1022    [MAR Hold] cefTRIAXone (ROCEPHIN) 2 g in sodium chloride 0.9 % 100 mL IVPB, 2 g, Intravenous, Q24H, Paduchowski, Lennette Bihari, MD, Last Rate: 200 mL/hr at 06/23/21 2107, 2 g at 06/23/21 2107   [facility] Hold] diphenhydrAMINE (BENADRYL) injection 12.5 mg, 12.5 mg, Intravenous, Q8H PRN, Nicole Kindred A, DO, 12.5 mg at 06/22/21 1703   [MAR Hold] docusate sodium (COLACE) capsule 100 mg, 100 mg, Oral, BID, [name], DO, 100 mg at 06/24/21 1016   [MAR Hold] escitalopram (LEXAPRO) tablet 20 mg, 20 mg, Oral, Daily, Nicole Kindred A, DO, 20 mg at 06/24/21 1014   [MAR Hold] furosemide (LASIX) tablet 40 mg, 40 mg, Oral, Daily, Jonelle Sidle, Mohammad L, MD, 40 mg at 06/23/21 1502   [MAR Hold] gabapentin (NEURONTIN) capsule 400 mg, 400 mg, Oral, TID, Nicole Kindred A, DO, 400 mg at 06/24/21 1016   [MAR Hold] guaiFENesin (MUCINEX) 12 hr tablet 600 mg, 600 mg, Oral, BID, Garba, Mohammad L, MD, 600 mg at 06/24/21 1015   [MAR Hold] HYDROcodone-acetaminophen (NORCO/VICODIN) 5-325 MG per tablet 1-2 tablet, 1-2 tablet, Oral, Q6H PRN, Nicole Kindred A, DO, 2 tablet at 06/24/21 1015   [MAR Hold] ipratropium-albuterol (DUONEB) 0.5-2.5 (3) MG/3ML nebulizer solution 3 mL, 3 mL, Nebulization, Q6H, Garba, Mohammad L, MD, 3 mL at 06/24/21 0833   [MAR Hold] lip balm (BLISTEX) ointment, , Topical, PRN, Madueme, Elvira C, RPH   [MAR Hold] methylPREDNISolone sodium succinate (SOLU-MEDROL) 40 mg/mL injection 40 mg, 40 mg, Intravenous, Q12H, Garba, Mohammad L, MD, 40 mg at 06/24/21 0230   [MAR Hold] metoprolol tartrate (LOPRESSOR) injection 5 mg, 5 mg, Intravenous, Q6H PRN, Elwyn Reach, MD   [MAR Hold] morphine (  PF) 2 MG/ML injection 2 mg, 2 mg, Intravenous, Q2H PRN, Nicole Kindred A, DO, 2 mg at 06/24/21 0925   [MAR Hold] ondansetron (ZOFRAN) tablet 4 mg, 4 mg, Oral, Q6H PRN **OR** [MAR Hold] ondansetron (ZOFRAN) injection 4 mg, 4 mg, Intravenous, Q6H PRN, Jonelle Sidle, Mohammad L, MD, 4 mg at 06/23/21 2214   [facility] Hold] oxyCODONE (Oxy  IR/ROXICODONE) immediate release tablet 5-10 mg, 5-10 mg, Oral, Q4H PRN, Nicole Kindred A, DO, 5 mg at 06/22/21 1359   [MAR Hold] pantoprazole (PROTONIX) EC tablet 40 mg, 40 mg, Oral, BID, Nicole Kindred A, DO, 40 mg at 06/24/21 1016   [MAR Hold] polyethylene glycol (MIRALAX / GLYCOLAX) packet 17 g, 17 g, Oral, BID, [name], DO, 17 g at 06/24/21 1014   [MAR Hold] polyethylene glycol-electrolytes (NuLYTELY) solution 2,000 mL, 2,000 mL, Oral, Once PRN, [name], DO   [facility] Hold] potassium chloride 10 mEq in 100 mL IVPB, 10 mEq, Intravenous, Once, Elwyn Reach, MD   [MAR Hold] QUEtiapine (SEROQUEL) tablet 25 mg, 25 mg, Oral, QHS, Griffith, Kelly A, DO, 25 mg at 06/23/21 2100   [MAR Hold] roflumilast (DALIRESP) tablet 500 mcg, 500 mcg, Oral, Daily, Nicole Kindred A, DO, 500 mcg at 06/24/21 1021  [MAR Hold] cefTRIAXone (ROCEPHIN)  IV 2 g (06/23/21 2107)   [MAR Hold] potassium chloride      [MAR Hold] ALPRAZolam, [MAR Hold] diphenhydrAMINE, [MAR Hold] HYDROcodone-acetaminophen, [MAR Hold] lip balm, [MAR Hold] metoprolol tartrate, [MAR Hold]  morphine injection, [MAR Hold] ondansetron **OR** [MAR Hold] ondansetron (ZOFRAN) IV, [MAR Hold] oxyCODONE, [MAR Hold] polyethylene glycol-electrolytes   Objective    Vitals:   06/24/21 0513 06/24/21 0734 06/24/21 0833 06/24/21 1125  BP: (!) 141/96 (!) 144/90  125/79  Pulse: 81 83  75  Resp: '19 19  18  '$ Temp: 97.8 F (36.6 C) 97.7 F (36.5 C)  97.9 F (36.6 C)  TempSrc:  Axillary    SpO2: 98% 98% 95% 95%  Weight:      Height:         Physical Exam Vitals and nursing note reviewed.  Constitutional:      General: She is not in acute distress.    Appearance: Normal appearance. She is not ill-appearing, toxic-appearing or diaphoretic.  HENT:     Head: Normocephalic and atraumatic.     Nose: Nose normal.     Mouth/Throat:     Mouth: Mucous membranes are moist.     Pharynx: Oropharynx is clear.  Eyes:     General:  No scleral icterus.    Extraocular Movements: Extraocular movements intact.  Cardiovascular:     Rate and Rhythm: Normal rate and regular rhythm.     Heart sounds: Murmur heard.    No friction rub. No gallop.  Pulmonary:     Effort: Pulmonary effort is normal. No respiratory distress.     Breath sounds: No wheezing, rhonchi or rales.     Comments: Diminished bilaterally Abdominal:     General: There is no distension.     Palpations: Abdomen is soft.     Tenderness: There is no abdominal tenderness. There is no guarding or rebound.     Comments: Ostomy pink and viable.  Musculoskeletal:     Cervical back: Neck supple.     Right lower leg: No edema.     Left lower leg: No edema.  Skin:    General: Skin is warm and dry.     Coloration: Skin is not jaundiced or  pale.  Neurological:     General: No focal deficit present.     Mental Status: She is alert and oriented to person, place, and time. Mental status is at baseline.  Psychiatric:        Mood and Affect: Mood normal.        Behavior: Behavior normal.        Thought Content: Thought content normal.        Judgment: Judgment normal.     Laboratory Data Recent Labs  Lab 06/22/21 0612 06/23/21 0540 06/24/21 0326  WBC 11.7* 8.7 8.3  HGB 11.9* 11.7* 11.3*  HCT 39.3 38.2 37.6  PLT 347 354 342    Recent Labs  Lab 06/20/21 1756 06/21/21 0117 06/22/21 0612 06/23/21 0540 06/24/21 0326  NA 138 142 135 133* 142  K 2.7* 3.7 3.6 4.0 3.9  CL 86* 90* 86* 81* 93*  CO2 41* 39* 38* 42* 42*  BUN '7 9 19 16 12  '$ CREATININE 0.74 0.75  0.68 0.74 0.55 0.61  CALCIUM 8.6* 8.6* 9.4 9.6 9.0  PROT 8.2* 8.2*  --   --   --   BILITOT 1.0 0.7  --   --   --   ALKPHOS 87 96  --   --   --   ALT 12 13  --   --   --   AST 18 17  --   --   --   GLUCOSE 117* 140* 117* 117* 90    No results for input(s): INR in the last 168 hours.    Imaging Studies: No results found.  Assessment:   # Anemia # Abdominal pain- around ostomy site #  Abnormal weight loss # Gastritis and suspected gastroparesis - noted on egd 06/23/21 # change in bowel habits with decreased output # phx polyps # s/p partial colon resection with colostomy formation 2/2 complicated diverticulitis # nsaid use # Acute on chronic hypoxic respiratory failure with hypercapnia # COPD # HFpEF  Plan:  EGD yesterday with gastritis Hgb unchanged Endoscopic/luminal eval through ostomy and anus today Completed go lytely and enema prep NPO since midnight Nutrition consult for gastroparesis Continue BID ppi Avoid nsaids CT w/o signs of obstruction Continue bowel regimen to soften bowel movements for ostomy output Hold dvt ppx for procedures Discussed case with pulmonology who feels pt is safe enough for sedation   Esophagogastroduodenoscopy, Flexible sigmoidoscopy and ostomy/luminal evaluation with possible biopsy, control of bleeding, polypectomy, and interventions as necessary has been discussed with the patient/patient representative. Informed consent was obtained from the patient/patient representative after explaining the indication, nature, and risks of the procedure including but not limited to death, bleeding, perforation, missed neoplasm/lesions, cardiorespiratory compromise, and reaction to medications. Opportunity for questions was given and appropriate answers were provided. Patient/patient representative has verbalized understanding is amenable to undergoing the procedure.  I personally performed the service.  Management of other medical comorbidities as per primary team  Thank you for allowing [place] to participate in this patient's care. Please don't hesitate to call if any questions or concerns arise.   [name], DO [facility]  Portions of the record may have been created with voice recognition software. Occasional wrong-word or 'sound-a-like' substitutions may have occurred due to the inherent limitations of voice  recognition software.  Read the chart carefully and recognize, using context, where substitutions may have occurred.

## 2021-06-24 NOTE — Progress Notes (Addendum)
Initial Nutrition Assessment  DOCUMENTATION CODES:   Obesity unspecified  INTERVENTION:   -Ensure Enlive po TID, each supplement provides 350 kcal and 20 grams of protein -MVI with minerals daily -Liberalize diet to regular for wider variety of meal selections -Discussed ways to increase calories and protein in diet (small, frequent meals, avoidance of high fat foods, and increased protein to preserve lean body mass and use of oral nutrition supplements). Teach back method used.   NUTRITION DIAGNOSIS:   Inadequate oral intake related to poor appetite, altered GI function as evidenced by per patient/family report.  GOAL:   Patient will meet greater than or equal to 90% of their needs  MONITOR:   PO intake, Supplement acceptance, Diet advancement  REASON FOR ASSESSMENT:   Consult Diet education  ASSESSMENT:   Pt with medical history significant of COPD, remote tobacco abuse, hypertension, diastolic CHF, peripheral neuropathy, chronic pain syndrome, history of cardiac arrest, obstructive sleep apnea, GERD who started having upper respitory tract infection symptoms about a week ago.  Pt admitted with acute on chronic respiratory failure with hypoxemia.   6/6- s/p EGD- revealed gastritis (biopsied)  Per GI notes, awaiting biopsy results. Plan for colonoscopy today. Pt is currently NPO for procedure.   Case discussed with RN, who confirms NPO status and plan for colonoscopy today.   Spoke with pt and husband at bedside. Pt endorses a general decline in health over the past 2 months secondary to nausea and early satiety. Per pt, she has been living off mainly grapes and oranges. She has been unable to keep other foods down, or eats very little. Per pt husband, she generally consumes only one meal per day ("she can't even eat a full happy meal- usually just a few fries and half of a hamburger").   Pt shares that she has difficulty arranging transportation for medical appointments  and this has become more difficult to her secondary to being dependent on oxygen. She admits that she should have followed up with her PCP sooner, but prolonged care due to these barriers. Pt also shares that she has had a colostomy bag for 3 years; she often experiences periods of constipation in which the bag will inflate with air and lose its seal. Pt shares that these episodes have been occurring since having the ostomy. Pt now with some ostomy output, but states "they gave me a lot of stuff in order to make that happen". RN shares pt is also due for an enema today.   Pt shares that she has lost 43 pounds over the past 2 months. Reviewed wt hx; pt has experienced a 13.9% wt loss over the past 9 months, which is not significant for time frame. However, this is concerning given pt's prolonged poor oral intake.  Per pt, she reports she was told there was undigested food found in her stomach during the EGD. She also expresses concern about weight loss and became tearful when RD shared results for NFPE with her; pt shares that she is extremely motivated to improve her health, as she lives for her 8 grandchildren whom she cares about deeply. RD discussed ways to help improve oral intake; discussed small, frequent meals and avoidance of high fat foods to aid with gastric transit as well as specific ways to add more protein in her diet to preserve lean body mass. Pt amenable to supplements, reporting she thought about using these PTA but had not purchased them yet. Teachback method used. Expect good compliance. Pt very  appreciative of RD assistance.    Medications reviewed and include colace, lasix, and miralax.   Labs reviewed.  NUTRITION - FOCUSED PHYSICAL EXAM:  Flowsheet Row Most Recent Value  Orbital Region No depletion  Upper Arm Region No depletion  Thoracic and Lumbar Region No depletion  Buccal Region No depletion  Temple Region No depletion  Clavicle Bone Region No depletion  Clavicle and  Acromion Bone Region No depletion  Scapular Bone Region No depletion  Dorsal Hand No depletion  Patellar Region Mild depletion  Anterior Thigh Region Mild depletion  Posterior Calf Region Mild depletion  Edema (RD Assessment) None  Hair Reviewed  Eyes Reviewed  Mouth Reviewed  Skin Reviewed  Nails Reviewed       Diet Order:   Diet Order             Diet Heart Room service appropriate? Yes; Fluid consistency: Thin  Diet effective now                   EDUCATION NEEDS:   Education needs have been addressed  Skin:  Skin Assessment: Reviewed RN Assessment  Last BM:  06/23/21 (via colostomy)  Height:   Ht Readings from Last 1 Encounters:  06/21/21 '5\' 4"'$  (1.626 m)    Weight:   Wt Readings from Last 1 Encounters:  06/21/21 81.6 kg    Ideal Body Weight:  54.5 kg  BMI:  Body mass index is 30.88 kg/m.  Estimated Nutritional Needs:   Kcal:  1700-1900  Protein:  90-105 grams  Fluid:  > 1.7 L    Loistine Chance, RD, LDN, [place] Registered Dietitian II Certified Diabetes Care and Education Specialist Please refer to [facility] for RD and/or RD on-call/weekend/after hours pager

## 2021-06-24 NOTE — Transfer of Care (Signed)
Immediate Anesthesia Transfer of Care Note  Patient: Misty Green  Procedure(s) Performed: COLONOSCOPY WITH PROPOFOL  Patient Location: PACU  Anesthesia Type:General  Level of Consciousness: awake  Airway & Oxygen Therapy: Patient Spontanous Breathing and Patient connected to nasal cannula oxygen  Post-op Assessment: Report given to RN and Post -op Vital signs reviewed and stable  Post vital signs: Reviewed and stable  Last Vitals:  Vitals Value Taken Time  BP 126/71 06/24/21 1416  Temp    Pulse 87 06/24/21 1416  Resp 20 06/24/21 1416  SpO2 100 % 06/24/21 1416  Vitals shown include unvalidated device data.  Last Pain:  Vitals:   06/24/21 1100  TempSrc:   PainSc: 6       Patients Stated Pain Goal: 2 (16/60/60 0459)  Complications: No notable events documented.

## 2021-06-24 NOTE — Progress Notes (Signed)
Patient requesting oxymizer mask at discharge, CSW spoke with Caryl Pina with [employer] who requests DME orders for patient's O2 at baseline 3Lwith comments in the notes about patient needing oxymizer machine.   Patient informed, agreeable for oxymizer to be shipped to home.   No further discharge needs identified.   [place], [place]

## 2021-06-24 NOTE — Discharge Instructions (Addendum)
No driving  on pain or anxiety meds  Nutrition [facility] Stay Proper nutrition can help your body recover from illness and injury.   Foods and beverages high in protein, vitamins, and minerals help rebuild muscle loss, promote healing, & reduce fall risk.   In addition to eating healthy foods, a nutrition shake is an easy, delicious way to get the nutrition you need during and after your hospital stay  It is recommended that you continue to drink 2 bottles per day of:       Ensure Plus or equivalent for at least 1 month (30 days) after your hospital stay   Tips for adding a nutrition shake into your routine: As allowed, drink one with vitamins or medications instead of water or juice Enjoy one as a tasty mid-morning or afternoon snack Drink cold or make a milkshake out of it Drink one instead of milk with cereal or snacks Use as a coffee creamer   Available at the following grocery stores and pharmacies:           * [place] [phone]            For COUPONS visit: www.ensure.com/join or [email or web address]-[email or web address]   Suggested Substitutions Ensure Plus = Boost Plus = Carnation Breakfast Essentials = Boost Compact Ensure Active Clear = Boost Breeze Glucerna Shake = Boost Glucose Control = Carnation Breakfast Essentials SUGAR FREE

## 2021-06-24 NOTE — Progress Notes (Signed)
Pt refused tap water enema. PT was resting with eyes closed on Trilogy. She got up to empty her colostomy bag and went back to bed. Pt completed Golytely.

## 2021-06-24 NOTE — Anesthesia Postprocedure Evaluation (Signed)
Anesthesia Post Note  Patient: Misty Green  Procedure(s) Performed: COLONOSCOPY WITH PROPOFOL  Patient location during evaluation: PACU Anesthesia Type: General Level of consciousness: awake and alert, oriented and patient cooperative Pain management: pain level controlled Vital Signs Assessment: post-procedure vital signs reviewed and stable Respiratory status: spontaneous breathing, nonlabored ventilation and respiratory function stable Cardiovascular status: blood pressure returned to baseline and stable Postop Assessment: adequate PO intake Anesthetic complications: no   No notable events documented.   Last Vitals:  Vitals:   06/24/21 1426 06/24/21 1436  BP:    Pulse:    Resp:    Temp:    SpO2: 100% 100%    Last Pain:  Vitals:   06/24/21 1436  TempSrc:   PainSc: 0-No pain                 Darrin Nipper

## 2021-06-24 NOTE — Progress Notes (Signed)
Inpatient Follow-up/Progress Note   Patient ID: Misty Green is a 51 y.o. female.  Overnight Events / Subjective Findings NAEON. Seen in endoscopy, completed 1 enema and Golytely prep for endoscopy today. Noted abdominal tenderness improved with passage of bms. Some blood noted after enema via anus. NPO since midnight No other acute gi complaints.  Review of Systems  Constitutional:  Positive for unexpected weight change. Negative for activity change, appetite change, chills, diaphoresis, fatigue and fever.  HENT:  Negative for trouble swallowing and voice change.   Respiratory:  Negative for shortness of breath and wheezing.   Cardiovascular:  Negative for chest pain, palpitations and leg swelling.  Gastrointestinal:  Positive for abdominal pain and constipation. Negative for abdominal distention, anal bleeding, blood in stool, diarrhea, nausea, rectal pain and vomiting.  Musculoskeletal:  Negative for arthralgias and myalgias.  Skin:  Negative for color change and pallor.  Neurological:  Negative for dizziness, syncope and weakness.  Psychiatric/Behavioral:  Negative for confusion.   All other systems reviewed and are negative.   Medications  Current Facility-Administered Medications:    [MAR Hold] acetaminophen (TYLENOL) tablet 1,000 mg, 1,000 mg, Oral, Q8H, 1,000 mg at 06/23/21 2100 **OR** [MAR Hold] acetaminophen (TYLENOL) suppository 650 mg, 650 mg, Rectal, Q8H, Ezekiel Slocumb, DO   [MAR Hold] ALPRAZolam Duanne Moron) tablet 0.25 mg, 0.25 mg, Oral, TID PRN, Nicole Kindred A, DO, 0.25 mg at 06/23/21 2214   [MAR Hold] azithromycin (ZITHROMAX) tablet 500 mg, 500 mg, Oral, Daily, Benita Gutter, RPH, 500 mg at 06/23/21 2100   [MAR Hold] budesonide (PULMICORT) nebulizer solution 0.25 mg, 0.25 mg, Nebulization, BID, Nicole Kindred A, DO, 0.25 mg at 06/24/21 0833   [MAR Hold] buPROPion (WELLBUTRIN) tablet 75 mg, 75 mg, Oral, BID, Nicole Kindred A, DO, 75 mg at 06/24/21 1022    [MAR Hold] cefTRIAXone (ROCEPHIN) 2 g in sodium chloride 0.9 % 100 mL IVPB, 2 g, Intravenous, Q24H, Paduchowski, Lennette Bihari, MD, Last Rate: 200 mL/hr at 06/23/21 2107, 2 g at 06/23/21 2107   [facility] Hold] diphenhydrAMINE (BENADRYL) injection 12.5 mg, 12.5 mg, Intravenous, Q8H PRN, Nicole Kindred A, DO, 12.5 mg at 06/22/21 1703   [MAR Hold] docusate sodium (COLACE) capsule 100 mg, 100 mg, Oral, BID, [name], DO, 100 mg at 06/24/21 1016   [MAR Hold] escitalopram (LEXAPRO) tablet 20 mg, 20 mg, Oral, Daily, Nicole Kindred A, DO, 20 mg at 06/24/21 1014   [MAR Hold] furosemide (LASIX) tablet 40 mg, 40 mg, Oral, Daily, Jonelle Sidle, Mohammad L, MD, 40 mg at 06/23/21 1502   [MAR Hold] gabapentin (NEURONTIN) capsule 400 mg, 400 mg, Oral, TID, Nicole Kindred A, DO, 400 mg at 06/24/21 1016   [MAR Hold] guaiFENesin (MUCINEX) 12 hr tablet 600 mg, 600 mg, Oral, BID, Garba, Mohammad L, MD, 600 mg at 06/24/21 1015   [MAR Hold] HYDROcodone-acetaminophen (NORCO/VICODIN) 5-325 MG per tablet 1-2 tablet, 1-2 tablet, Oral, Q6H PRN, Nicole Kindred A, DO, 2 tablet at 06/24/21 1015   [MAR Hold] ipratropium-albuterol (DUONEB) 0.5-2.5 (3) MG/3ML nebulizer solution 3 mL, 3 mL, Nebulization, Q6H, Garba, Mohammad L, MD, 3 mL at 06/24/21 0833   [MAR Hold] lip balm (BLISTEX) ointment, , Topical, PRN, Madueme, Elvira C, RPH   [MAR Hold] methylPREDNISolone sodium succinate (SOLU-MEDROL) 40 mg/mL injection 40 mg, 40 mg, Intravenous, Q12H, Garba, Mohammad L, MD, 40 mg at 06/24/21 0230   [MAR Hold] metoprolol tartrate (LOPRESSOR) injection 5 mg, 5 mg, Intravenous, Q6H PRN, Elwyn Reach, MD   [MAR Hold] morphine (  PF) 2 MG/ML injection 2 mg, 2 mg, Intravenous, Q2H PRN, Nicole Kindred A, DO, 2 mg at 06/24/21 0925   [MAR Hold] ondansetron (ZOFRAN) tablet 4 mg, 4 mg, Oral, Q6H PRN **OR** [MAR Hold] ondansetron (ZOFRAN) injection 4 mg, 4 mg, Intravenous, Q6H PRN, Jonelle Sidle, Mohammad L, MD, 4 mg at 06/23/21 2214   [facility] Hold] oxyCODONE (Oxy  IR/ROXICODONE) immediate release tablet 5-10 mg, 5-10 mg, Oral, Q4H PRN, Nicole Kindred A, DO, 5 mg at 06/22/21 1359   [MAR Hold] pantoprazole (PROTONIX) EC tablet 40 mg, 40 mg, Oral, BID, Nicole Kindred A, DO, 40 mg at 06/24/21 1016   [MAR Hold] polyethylene glycol (MIRALAX / GLYCOLAX) packet 17 g, 17 g, Oral, BID, [name], DO, 17 g at 06/24/21 1014   [MAR Hold] polyethylene glycol-electrolytes (NuLYTELY) solution 2,000 mL, 2,000 mL, Oral, Once PRN, [name], DO   [facility] Hold] potassium chloride 10 mEq in 100 mL IVPB, 10 mEq, Intravenous, Once, Elwyn Reach, MD   [MAR Hold] QUEtiapine (SEROQUEL) tablet 25 mg, 25 mg, Oral, QHS, Griffith, Kelly A, DO, 25 mg at 06/23/21 2100   [MAR Hold] roflumilast (DALIRESP) tablet 500 mcg, 500 mcg, Oral, Daily, Nicole Kindred A, DO, 500 mcg at 06/24/21 1021  [MAR Hold] cefTRIAXone (ROCEPHIN)  IV 2 g (06/23/21 2107)   [MAR Hold] potassium chloride      [MAR Hold] ALPRAZolam, [MAR Hold] diphenhydrAMINE, [MAR Hold] HYDROcodone-acetaminophen, [MAR Hold] lip balm, [MAR Hold] metoprolol tartrate, [MAR Hold]  morphine injection, [MAR Hold] ondansetron **OR** [MAR Hold] ondansetron (ZOFRAN) IV, [MAR Hold] oxyCODONE, [MAR Hold] polyethylene glycol-electrolytes   Objective    Vitals:   06/24/21 0513 06/24/21 0734 06/24/21 0833 06/24/21 1125  BP: (!) 141/96 (!) 144/90  125/79  Pulse: 81 83  75  Resp: '19 19  18  '$ Temp: 97.8 F (36.6 C) 97.7 F (36.5 C)  97.9 F (36.6 C)  TempSrc:  Axillary    SpO2: 98% 98% 95% 95%  Weight:      Height:         Physical Exam Vitals and nursing note reviewed.  Constitutional:      General: She is not in acute distress.    Appearance: Normal appearance. She is not ill-appearing, toxic-appearing or diaphoretic.  HENT:     Head: Normocephalic and atraumatic.     Nose: Nose normal.     Mouth/Throat:     Mouth: Mucous membranes are moist.     Pharynx: Oropharynx is clear.  Eyes:     General:  No scleral icterus.    Extraocular Movements: Extraocular movements intact.  Cardiovascular:     Rate and Rhythm: Normal rate and regular rhythm.     Heart sounds: Murmur heard.    No friction rub. No gallop.  Pulmonary:     Effort: Pulmonary effort is normal. No respiratory distress.     Breath sounds: No wheezing, rhonchi or rales.     Comments: Diminished bilaterally Abdominal:     General: There is no distension.     Palpations: Abdomen is soft.     Tenderness: There is no abdominal tenderness. There is no guarding or rebound.     Comments: Ostomy pink and viable.  Musculoskeletal:     Cervical back: Neck supple.     Right lower leg: No edema.     Left lower leg: No edema.  Skin:    General: Skin is warm and dry.     Coloration: Skin is not jaundiced or  pale.  Neurological:     General: No focal deficit present.     Mental Status: She is alert and oriented to person, place, and time. Mental status is at baseline.  Psychiatric:        Mood and Affect: Mood normal.        Behavior: Behavior normal.        Thought Content: Thought content normal.        Judgment: Judgment normal.     Laboratory Data Recent Labs  Lab 06/22/21 0612 06/23/21 0540 06/24/21 0326  WBC 11.7* 8.7 8.3  HGB 11.9* 11.7* 11.3*  HCT 39.3 38.2 37.6  PLT 347 354 342    Recent Labs  Lab 06/20/21 1756 06/21/21 0117 06/22/21 0612 06/23/21 0540 06/24/21 0326  NA 138 142 135 133* 142  K 2.7* 3.7 3.6 4.0 3.9  CL 86* 90* 86* 81* 93*  CO2 41* 39* 38* 42* 42*  BUN '7 9 19 16 12  '$ CREATININE 0.74 0.75  0.68 0.74 0.55 0.61  CALCIUM 8.6* 8.6* 9.4 9.6 9.0  PROT 8.2* 8.2*  --   --   --   BILITOT 1.0 0.7  --   --   --   ALKPHOS 87 96  --   --   --   ALT 12 13  --   --   --   AST 18 17  --   --   --   GLUCOSE 117* 140* 117* 117* 90    No results for input(s): INR in the last 168 hours.    Imaging Studies: No results found.  Assessment:   # Anemia # Abdominal pain- around ostomy site #  Abnormal weight loss # Gastritis and suspected gastroparesis - noted on egd 06/23/21 # change in bowel habits with decreased output # phx polyps # s/p partial colon resection with colostomy formation 2/2 complicated diverticulitis # nsaid use # Acute on chronic hypoxic respiratory failure with hypercapnia # COPD # HFpEF  Plan:  EGD yesterday with gastritis Hgb unchanged Endoscopic/luminal eval through ostomy and anus today Completed go lytely and enema prep NPO since midnight Nutrition consult for gastroparesis Continue BID ppi Avoid nsaids CT w/o signs of obstruction Continue bowel regimen to soften bowel movements for ostomy output Hold dvt ppx for procedures Discussed case with pulmonology who feels pt is safe enough for sedation   Esophagogastroduodenoscopy, Flexible sigmoidoscopy and ostomy/luminal evaluation with possible biopsy, control of bleeding, polypectomy, and interventions as necessary has been discussed with the patient/patient representative. Informed consent was obtained from the patient/patient representative after explaining the indication, nature, and risks of the procedure including but not limited to death, bleeding, perforation, missed neoplasm/lesions, cardiorespiratory compromise, and reaction to medications. Opportunity for questions was given and appropriate answers were provided. Patient/patient representative has verbalized understanding is amenable to undergoing the procedure.  I personally performed the service.  Management of other medical comorbidities as per primary team  Thank you for allowing [place] to participate in this patient's care. Please don't hesitate to call if any questions or concerns arise.   [name], DO [facility]  Portions of the record may have been created with voice recognition software. Occasional wrong-word or 'sound-a-like' substitutions may have occurred due to the inherent limitations of voice  recognition software.  Read the chart carefully and recognize, using context, where substitutions may have occurred.

## 2021-06-24 NOTE — Interval H&P Note (Signed)
History and Physical Interval Note: Progress note from 06/24/21  was reviewed and there was no interval change after seeing and examining the patient.  Written consent was obtained from the patient after discussion of risks, benefits, and alternatives. Patient has consented to proceed with  colonoscopy via the ostomy and anus  with possible intervention   06/24/2021 12:44 PM  Misty Green  has presented today for surgery, with the diagnosis of abnormal weight loss, anemia.  The various methods of treatment have been discussed with the patient and family. After consideration of risks, benefits and other options for treatment, the patient has consented to  Procedure(s) with comments: COLONOSCOPY WITH PROPOFOL (N/A) - through stoma as a surgical intervention.  The patient's history has been reviewed, patient examined, no change in status, stable for surgery.  I have reviewed the patient's chart and labs.  Questions were answered to the patient's satisfaction.     [name]

## 2021-06-24 NOTE — Discharge Summary (Signed)
Physician Discharge Summary   Patient: Misty Green MRN: [id number] DOB: [birth year]  Admit date:     06/20/2021  Discharge date: 06/24/21  Discharge Physician: [name]   PCP: [name], MD   Recommendations at discharge:   Follow-up PCP 5 days Follow-up Dr. Virgina Jock gastroenterology for repeat colonoscopy with poor prep. Can follow-up with Dr. [name]  Discharge Diagnoses: Principal Problem:   Acute on chronic respiratory failure with hypoxemia ([place]) Active Problems:   COPD exacerbation (HCC)   CAP (community acquired pneumonia)   Abdominal tenderness   Nausea & vomiting   History of diverticulitis   Chronic pain syndrome   Anxiety   OSA on CPAP   Obesity   Acute on chronic diastolic CHF (congestive heart failure) (HCC)   Hypokalemia   Unintentional weight loss   History of repair of inguinal hernia   Elevated troponin    Hospital Course: 51 year old female past medical history of chronic respiratory failure secondary to COPD on 3 L/min O2 at baseline and trilogy when sleeping, hypertension, diastolic CHF, peripheral neuropathy, chronic pain syndrome, history of cardiac arrest, OSA, GERD, remote tobacco abuse, history of diverticulitis status post partial resection with colostomy presented to the ED on the evening of 06/20/2021 with worsening generalized weakness, cough and shortness of breath.  EMS was called and found her O2 sats 70s on her typical 3 L/min oxygen.  On arrival to the ER, patient required BiPAP and blood gas showed hypercapnia.  Chest x-ray was concerning for pneumonia and or CHF.  Admitted to the hospital and started on IV steroids and IV antibiotics.  Maintained on oral diuretic given less suspicion for decompensated CHF.  6/4: Patient reports 43 pounds weight loss in about 8 weeks due to daily nausea vomiting and not tolerating p.o. intake.  She wakes up diaphoretic and nauseous every morning.  She has abdominal pain as well and is tender on  exam.  She reports history of hernia repair with mesh, colostomy due to diverticulitis in the past requiring partial colon resection.  Gastroenterology performed a colonoscopy and polyps were removed.  Since it was a poor prep they would like to repeat a colonoscopy as outpatient.  Assessment and Plan: * Acute on chronic respiratory failure with hypoxemia (HCC) Baseline O2 requirement 3 L/min, uses trilogy NIV overnight and whenever sleeping at home.  Presented with progressive shortness of breath and increased oxygen requirements well above her baseline.  Required BiPAP in the ED. -- Treat COPD and pneumonia as outlined -- Completed treatment Rocephin and Zithromax here in the hospital -- BiPAP when sleeping and as needed  CAP (community acquired pneumonia) Presented with cough, worsening shortness of breath acute on chronic respiratory failure.  Chest x-ray with constellation of findings that may reflect pulmonary edema with scattered areas of atelectasis versus atypical infection. -- Completed Rocephin and Zithromax   COPD exacerbation ([place]) Presented in respiratory distress requiring BiPAP.  Completed antibiotics here in the hospital.  We will give a tapering dose of steroid upon disposition.  Chronic respiratory failure with hypoxia and hypercapnia -baseline O2 requirement 3 L/min, Trilogy NIV when sleeping.  On Trelegy, Daliresp at home.   Nausea & vomiting Patient reports waking up daily diaphoretic and nauseous, very poor tolerance for oral intake leading to unintentional weight loss. Symptoms most consistent with gastroparesis, CT with stomach distended with PO contrast, EGD findings consistent with this as well. -- CT abdomen pelvis unrevealing -- As needed antiemetics --GI following, see recommendations --Endoscopic evaluations underway --  Dietitian consulted for education on gastroparesis diet   Abdominal tenderness Can be related to constipation and hard stools.  Elevated  troponin Likely demand ischemia in the setting of acute on chronic respiratory failure.    Unintentional weight loss 43 pound weight loss in about 8 weeks secondary to poor tolerance for any oral intake with daily nausea vomiting.  See abdominal tenderness, nausea and vomiting for management. -- I had the radiologist reread the CAT scan of the chest abdomen pelvis.  No findings that would be malignant.  Hypokalemia Presented with potassium 2.7, replaced during the hospital course and will give replacement as outpatient.  Acute on chronic diastolic CHF (congestive heart failure) ([place]) Patient appears euvolemic on her oral Lasix.  No significant peripheral edema and respiratory decompensation appears more related to COPD and potential pneumonia. -- Continue oral Lasix 40 mg daily -- Monitor volume status closely  Obesity Body mass index is 30.88 kg/m. With 43 pound unintentional weight loss in the past approximate 8 weeks related to abdominal pain and daily nausea vomiting.  OSA on CPAP Patient was initially on CPAP, now on trilogy.  Continue BiPAP nightly and as needed  Anxiety Resume home Wellbutrin, Lexapro, Seroquel  Chronic pain syndrome Patient has long history of back pain and degenerative changes. --Pain control as needed --Tylenol, oxycodone, IV morphine for breakthrough pain         Consultants: Gastroenterology, pulmonology Procedures performed: Colonoscopy Disposition: Home Diet recommendation:  Regular DISCHARGE MEDICATION: Allergies as of 06/24/2021       Reactions   Other Nausea And Vomiting   Anti-depressent that starts with t        Medication List     STOP taking these medications    amLODipine 10 MG tablet Commonly known as: NORVASC   budesonide 0.5 MG/2ML nebulizer solution Commonly known as: PULMICORT   feeding supplement Liqd   fluticasone 50 MCG/ACT nasal spray Commonly known as: FLONASE   guaiFENesin-codeine 100-10 MG/5ML syrup    hydrOXYzine 25 MG tablet Commonly known as: ATARAX   ipratropium-albuterol 0.5-2.5 (3) MG/3ML Soln Commonly known as: DUONEB   montelukast 10 MG tablet Commonly known as: SINGULAIR   nicotine 14 mg/24hr patch Commonly known as: NICODERM CQ - dosed in mg/24 hours   promethazine 25 MG tablet Commonly known as: PHENERGAN   senna-docusate 8.6-50 MG tablet Commonly known as: Senokot-S   Vitamin D (Ergocalciferol) 1.25 MG (50000 UNIT) Caps capsule Commonly known as: DRISDOL       TAKE these medications    acetaminophen 325 MG tablet Commonly known as: TYLENOL Take 1-2 tablets (325-650 mg total) by mouth every 4 (four) hours as needed for mild pain. What changed:  how much to take when to take this   albuterol (2.5 MG/3ML) 0.083% nebulizer solution Commonly known as: PROVENTIL Take 2.5 mg by nebulization every 6 (six) hours as needed for wheezing or shortness of breath. Inhale one vial via nebulizer 4 times a day as directed.   albuterol 108 (90 Base) MCG/ACT inhaler Commonly known as: VENTOLIN HFA Inhale 2 puffs into the lungs every 6 (six) hours as needed for wheezing or shortness of breath.   ALPRAZolam 0.25 MG tablet Commonly known as: XANAX Take 1 tablet (0.25 mg total) by mouth 3 (three) times daily as needed for anxiety or sleep.   buPROPion 75 MG tablet Commonly known as: WELLBUTRIN Take 75 mg by mouth 2 (two) times daily.   Daliresp 500 MCG Tabs tablet Generic drug: roflumilast TAKE 1  TABLET BY MOUTH DAILY What changed: how much to take   escitalopram 20 MG tablet Commonly known as: LEXAPRO One '20mg'$  tablet po daily What changed:  how much to take how to take this when to take this additional instructions   furosemide 40 MG tablet Commonly known as: LASIX Take 40 mg by mouth daily.   gabapentin 400 MG capsule Commonly known as: NEURONTIN Take 1 capsule (400 mg total) by mouth 3 (three) times daily.   Hyoscyamine Sulfate SL 0.125 MG Subl Place  under the tongue every 6 (six) hours as needed.   Melatonin 10 MG Tabs Take 30 mg by mouth at bedtime.   multivitamin with minerals tablet Take 1 tablet by mouth at bedtime.   ondansetron 4 MG tablet Commonly known as: ZOFRAN Take 4 mg by mouth every 8 (eight) hours as needed for nausea or vomiting.   oxyCODONE 5 MG immediate release tablet Commonly known as: Oxy IR/ROXICODONE Take 1 tablet (5 mg total) by mouth every 6 (six) hours as needed for severe pain.   Ozempic (0.25 or 0.5 MG/DOSE) 2 MG/1.5ML Sopn Generic drug: Semaglutide(0.25 or 0.'5MG'$ /DOS) Inject 0.25 mg into the skin once a week.   pantoprazole 40 MG tablet Commonly known as: PROTONIX Take 40 mg by mouth 2 (two) times daily.   polyethylene glycol 17 g packet Commonly known as: MIRALAX / GLYCOLAX Take 17 g by mouth daily.   potassium chloride SA 20 MEQ tablet Commonly known as: KLOR-CON M Take 1 tablet (20 mEq total) by mouth daily.   predniSONE 10 MG tablet Commonly known as: DELTASONE 3 tabs po day1; 2 tabs po day2; 1 tab po day3; 1/2 tab po day4,5 Start taking on: June 25, 2021   PRESCRIPTION MEDICATION Inhale into the lungs at bedtime. BiPap   QUEtiapine 25 MG tablet Commonly known as: SEROQUEL Take 1 tablet (25 mg total) by mouth at bedtime.   Trelegy Ellipta 100-62.5-25 MCG/ACT Aepb Generic drug: Fluticasone-Umeclidin-Vilant Inhale 1 each into the lungs daily.               Durable Medical Equipment  (From admission, onward)           Start     Ordered   06/24/21 1548  For home use only DME oxygen  Once       Comments: oxymizer  Question Answer Comment  Length of Need Lifetime   Mode or (Route) Nasal cannula   Liters per Minute 3   Frequency Continuous (stationary and portable oxygen unit needed)   Oxygen conserving device Yes   Oxygen delivery system Gas      06/24/21 1547            Follow-up Information     [name], MD Follow up in 5 day(s).   Specialty:  Internal Medicine Contact information: [place] [address] [phone]         [name], DO Follow up in 2 month(s).   Specialty: Gastroenterology Contact information: [facility] [place] [address] [phone]                Discharge Exam: Danley Danker Weights   06/21/21 0150  Weight: 81.6 kg   Physical Exam HENT:     Mouth/Throat:     Pharynx: No oropharyngeal exudate.  Eyes:     General: Lids are normal.     Conjunctiva/sclera: Conjunctivae normal.  Cardiovascular:     Rate and Rhythm: Normal rate and  regular rhythm.     Heart sounds: Normal heart sounds, S1 normal and S2 normal.  Pulmonary:     Breath sounds: Examination of the right-lower field reveals decreased breath sounds. Examination of the left-lower field reveals decreased breath sounds. Decreased breath sounds present. No wheezing, rhonchi or rales.  Abdominal:     Palpations: Abdomen is soft.     Tenderness: There is no abdominal tenderness.  Musculoskeletal:     Right lower leg: No swelling.     Left lower leg: No swelling.  Skin:    General: Skin is warm.     Findings: No rash.  Neurological:     Mental Status: She is alert and oriented to person, place, and time.     Condition at discharge: stable  The results of significant diagnostics from this hospitalization (including imaging, microbiology, ancillary and laboratory) are listed below for reference.   Imaging Studies: CT CHEST ABDOMEN PELVIS W CONTRAST  Addendum Date: 06/24/2021   ADDENDUM REPORT: 06/24/2021 07:27 ADDENDUM: IMPRESSION: 1. Scattered ill-defined pulmonary opacities, some of which are tree-in-bud within the lower lobes and right middle lobe. Many of these opacities are small, ill-defined and nodular in appearance. Favor infection/inflammation, including aspiration in the setting of repeated vomiting. 2.  No findings suspicious for malignancy. 3. Mild emphysema  and bronchial thickening. 4. Nonobstructing left nephrolithiasis. 5. Descending colostomy with peristomal hernia containing small bowel. Colonic diverticulosis without diverticulitis. Aortic Atherosclerosis (ICD10-I70.0) and Emphysema (ICD10-J43.9). Electronically Signed   By: Kerby Moors M.D.   On: 06/24/2021 07:27   Result Date: 06/24/2021 CLINICAL DATA:  Unintended weight loss. Daily nausea and vomiting leading to weight loss. EXAM: CT CHEST, ABDOMEN, AND PELVIS WITH CONTRAST TECHNIQUE: Multidetector CT imaging of the chest, abdomen and pelvis was performed following the standard protocol during bolus administration of intravenous contrast. RADIATION DOSE REDUCTION: This exam was performed according to the departmental dose-optimization program which includes automated exposure control, adjustment of the mA and/or kV according to patient size and/or use of iterative reconstruction technique. CONTRAST:  175m OMNIPAQUE IOHEXOL 300 MG/ML  SOLN COMPARISON:  Abdominopelvic CT 10/05/2020. Chest radiograph yesterday. Chest CT 06/05/2018 FINDINGS: CT CHEST FINDINGS Cardiovascular: Mild aortic atherosclerosis. There are coronary artery calcifications. The heart is normal in size. Exam not tailored to pulmonary artery assessment, no pulmonary embolus. No pericardial effusion. Mediastinum/Nodes: No enlarged mediastinal or hilar lymph nodes. No esophageal wall thickening. No thyroid nodule. Lungs/Pleura: Mild apical predominant emphysema. There are multiple calcified nodules consistent with prior granulomatous disease. Mild bronchial wall thickening. There are scattered ill-defined opacities, some of which are tree-in-bud within the lower lobes and right middle lobe, many of which are small, ill-defined and nodular in appearance. There is a basilar predominant distribution. No pleural fluid. Musculoskeletal: No focal bone lesion. No acute osseous finding. No obvious breast mass by CT. CT ABDOMEN PELVIS FINDINGS  Hepatobiliary: No suspicious liver lesion. Occasional tiny subcentimeter hepatic hypodensities are too small to characterize and likely small cysts or hemangiomas. Cholecystectomy without biliary dilatation. Pancreas: No ductal dilatation or inflammation. Spleen: Normal in size without focal abnormality. Adrenals/Urinary Tract: No adrenal nodule. Punctate nonobstructing stone in the lower pole of the left kidney. No hydronephrosis. 12 mm low-density lesion in the lower right kidney, series 2, image 77, stable in size from 2019 exam and likely minimally complex cyst. Additional tiny renal hypodensities are too small to characterize. No dedicated further follow-up is recommended. The urinary bladder is empty. Stomach/Bowel: Stomach is distended with enteric contrast. There is  no gastric wall thickening. Administered enteric contrast reaches the mid-distal small bowel. No small bowel obstruction or inflammation. Low lying cecum in the pelvic midline. The appendix is not confidently visualized. Multifocal colonic diverticulosis. No diverticulitis or acute colonic inflammation. Descending colostomy. Parastomal hernia contains nonobstructed loop of small bowel as well as portion of descending colon. Stapled off sigmoid colon in the pelvis. Vascular/Lymphatic: Aortic atherosclerosis. No aortic aneurysm. Portal vein is patent. No abdominopelvic adenopathy. Reproductive: Hysterectomy.  No adnexal mass. Other: Postsurgical change of the anterior abdominal wall with rectus diastasis. No ascites, free air or abdominopelvic collection. Musculoskeletal: Interbody spacers at L4-L5 and L5-S1. No acute osseous findings. Occasional bone islands. IMPRESSION: 1. Scattered ill-defined pulmonary opacities, some of which are tree-in-bud within the lower lobes and right middle lobe. Many of these opacities are small, ill-defined and nodular in appearance. Favor infection/inflammation, including aspiration in the setting of repeated  vomiting. 2. Findings suspicious for malignancy. 3. Mild emphysema and bronchial thickening. 4. Nonobstructing left nephrolithiasis. 5. Descending colostomy with peristomal hernia containing small bowel. Colonic diverticulosis without diverticulitis. Aortic Atherosclerosis (ICD10-I70.0) and Emphysema (ICD10-J43.9). Electronically Signed: By: Keith Rake M.D. On: 06/21/2021 22:02   DG Chest Portable 1 View  Result Date: 06/20/2021 CLINICAL DATA:  SOB EXAM: PORTABLE CHEST 1 VIEW COMPARISON:  Radiograph dated November 11, 2020 FINDINGS: Evaluation is limited by rotation. The cardiomediastinal silhouette is unchanged in contour. Small RIGHT pleural effusion. No pneumothorax. Peribronchial cuffing with interstitial prominence. Patchy bibasilar opacities. Visualized abdomen is unremarkable. IMPRESSION: Constellation of findings may reflect pulmonary edema with scattered areas of atelectasis. Atypical infection could present similarly. Electronically Signed   By: Valentino Saxon M.D.   On: 06/20/2021 18:41    Microbiology: Results for orders placed or performed during the hospital encounter of 06/20/21  SARS Coronavirus 2 by RT PCR (hospital order, performed in [facility] hospital lab) *cepheid single result test* Anterior Nasal Swab     Status: None   Collection Time: 06/20/21  5:56 PM   Specimen: Anterior Nasal Swab  Result Value Ref Range Status   SARS Coronavirus 2 by RT PCR NEGATIVE NEGATIVE Final    Comment: (NOTE) SARS-CoV-2 target nucleic acids are NOT DETECTED.  The SARS-CoV-2 RNA is generally detectable in upper and lower respiratory specimens during the acute phase of infection. The lowest concentration of SARS-CoV-2 viral copies this assay can detect is 250 copies / mL. A negative result does not preclude SARS-CoV-2 infection and should not be used as the sole basis for treatment or other patient management decisions.  A negative result may occur with improper specimen collection /  handling, submission of specimen other than nasopharyngeal swab, presence of viral mutation(s) within the areas targeted by this assay, and inadequate number of viral copies (<250 copies / mL). A negative result must be combined with clinical observations, patient history, and epidemiological information.  Fact Sheet for Patients:   https://www.patel.info/  Fact Sheet for Healthcare Providers: https://hall.com/  This test is not yet approved or  cleared by the [place] FDA and has been authorized for detection and/or diagnosis of SARS-CoV-2 by FDA under an Emergency Use Authorization (EUA).  This EUA will remain in effect (meaning this test can be used) for the duration of the COVID-19 declaration under Section 564(b)(1) of the Act, 21 U.S.C. section 360bbb-3(b)(1), unless the authorization is terminated or revoked sooner.  Performed at [facility], [address], [place], [place] [address]   Blood culture (routine x 2)  Status: None (Preliminary result)   Collection Time: 06/20/21  7:51 PM   Specimen: BLOOD  Result Value Ref Range Status   Specimen Description BLOOD BLOOD LEFT WRIST  Final   Special Requests   Final    BOTTLES DRAWN AEROBIC AND ANAEROBIC Blood Culture results may not be optimal due to an inadequate volume of blood received in culture bottles   Culture   Final    NO GROWTH 4 DAYS Performed at [facility], [address] [place], [place] [address]    Report Status PENDING  Incomplete  Blood culture (routine x 2)     Status: None (Preliminary result)   Collection Time: 06/20/21  7:51 PM   Specimen: BLOOD  Result Value Ref Range Status   Specimen Description BLOOD BLOOD RIGHT ARM  Final   Special Requests   Final    BOTTLES DRAWN AEROBIC ONLY Blood Culture results may not be optimal due to an inadequate volume of blood received in culture bottles   Culture   Final    NO GROWTH 4  DAYS Performed at [facility], [address] [place], [place] [address]    Report Status PENDING  Incomplete    Labs: CBC: Recent Labs  Lab 06/20/21 1756 06/21/21 0117 06/22/21 0612 06/23/21 0540 06/24/21 0326  WBC 19.2* 12.8* 11.7* 8.7 8.3  HGB 12.5 12.4 11.9* 11.7* 11.3*  HCT 41.6 41.4 39.3 38.2 37.6  MCV 95.9 95.2 94.9 94.3 95.2  PLT 301 317 347 354 545   Basic Metabolic Panel: Recent Labs  Lab 06/20/21 1756 06/21/21 0117 06/22/21 0612 06/23/21 0540 06/24/21 0326  NA 138 142 135 133* 142  K 2.7* 3.7 3.6 4.0 3.9  CL 86* 90* 86* 81* 93*  CO2 41* 39* 38* 42* 42*  GLUCOSE 117* 140* 117* 117* 90  BUN '7 9 19 16 12  '$ CREATININE 0.74 0.75  0.68 0.74 0.55 0.61  CALCIUM 8.6* 8.6* 9.4 9.6 9.0  MG  --   --  2.1 2.6*  --   PHOS  --   --  4.2  --   --    Liver Function Tests: Recent Labs  Lab 06/20/21 1756 06/21/21 0117  AST 18 17  ALT 12 13  ALKPHOS 87 96  BILITOT 1.0 0.7  PROT 8.2* 8.2*  ALBUMIN 3.2* 3.3*     Discharge time spent: greater than 30 minutes.  Signed: [name], MD [facility] 06/24/2021

## 2021-06-24 NOTE — Progress Notes (Signed)
PULMONOLOGY         Date: 06/24/2021,   MRN# [id number] Misty Green [birth year]     AdmissionWeight: 81.6 kg                 CurrentWeight: 81.6 kg  Referring provider: Dr Arbutus Ped   CHIEF COMPLAINT:   Abnormal CT chest with findings to suggest primary bronchogenic carcinoma   HISTORY OF PRESENT ILLNESS   51 year old female with a history of CHF, current smoker advanced COPD GERD, essential hypertension and chronic hypoxemia with OSA overlap, previous cardiac arrest, uses 3 L/min on outpatient came in with acute on chronic hypoxemia required BiPAP initially chest x-ray was abnormal with findings of possible pneumonia and primary bronchogenic carcinoma.  Reviewed CT chest independently with findings of chronic bronchitic changes bilaterally, significant motion artifact tree-in-bud opacification bronchiectatic changes worse on the right lower lobe.   06/24/21- patient is cleared for dc home from pulmonary standpoint and will be seen in office within 2 wks    PAST MEDICAL HISTORY   Past Medical History:  Diagnosis Date   Cardiac arrest ([place]) 05/12/2018   CHF (congestive heart failure) ([facility])    Chicken pox    COPD (chronic obstructive pulmonary disease) ([place])    Current smoker    Dyspnea    GERD (gastroesophageal reflux disease)    Hypertension    Malfunction of colostomy and enterostomy ([place])    Opiate abuse, continuous ([place])    Oxygen deficit    Sleep apnea      SURGICAL HISTORY   Past Surgical History:  Procedure Laterality Date   ABDOMINAL HYSTERECTOMY     BACK SURGERY     cesction     COLOSTOMY  01/13/2018   Procedure: COLOSTOMY Creation;  Surgeon: Jules Husbands, MD;  Location: [facility];  Service: General;;   ESOPHAGOGASTRODUODENOSCOPY (EGD) WITH PROPOFOL N/A 06/23/2021   Procedure: ESOPHAGOGASTRODUODENOSCOPY (EGD) WITH PROPOFOL;  Surgeon: [name], DO;  Location: [facility];  Service: Gastroenterology;  Laterality: N/A;   HERNIA  REPAIR     INCISIONAL HERNIA REPAIR     lower midline laparotomy incision (hysterectomy), repaired with mesh   IR FLUORO GUIDE CV LINE RIGHT  05/26/2018   IR REMOVAL TUN CV CATH W/O FL  06/02/2018   IR US GUIDE VASC ACCESS RIGHT  05/26/2018   LAPAROSCOPIC CHOLECYSTECTOMY  2012   LAPAROSCOPIC LYSIS OF ADHESIONS  01/13/2018   Procedure: LAPAROSCOPIC LYSIS OF ADHESIONS;  Surgeon: Jules Husbands, MD;  Location: [facility];  Service: General;;   LAPAROSCOPIC SIGMOID COLECTOMY  01/13/2018   Procedure: LAPAROSCOPIC SIGMOID COLECTOMY;  Surgeon: Jules Husbands, MD;  Location: [facility];  Service: General;;   LUMBAR SPINE SURGERY     ORIF WRIST FRACTURE Right 08/25/2015   Procedure: OPEN REDUCTION INTERNAL FIXATION (ORIF) WRIST FRACTURE;  Surgeon: Corky Mull, MD;  Location: [facility];  Service: Orthopedics;  Laterality: Right;   OVARIAN CYST REMOVAL       FAMILY HISTORY   Family History  Problem Relation Age of Onset   Diabetes Mother    COPD Mother    Hypertension Father    Diabetes Father    Heart disease Father    Hyperlipidemia Father    Prostate cancer Father      SOCIAL HISTORY   Social History   Tobacco Use   Smoking status: Former    Packs/day: 1.00    Years: 15.00    Pack years: 15.00  Types: Cigarettes   Smokeless tobacco: Never   Tobacco comments:    4 cigarettes a day   Vaping Use   Vaping Use: Former   Substances: Nicotine  Substance Use Topics   Alcohol use: Yes    Comment: rarely   Drug use: Yes    Types: Marijuana    Comment: not everyday      MEDICATIONS    Home Medication:    Current Medication:  Current Facility-Administered Medications:    acetaminophen (TYLENOL) tablet 1,000 mg, 1,000 mg, Oral, Q8H, 1,000 mg at 06/24/21 1608 **OR** acetaminophen (TYLENOL) suppository 650 mg, 650 mg, Rectal, Q8H, Griffith, Kelly A, DO   ALPRAZolam Duanne Moron) tablet 0.25 mg, 0.25 mg, Oral, TID PRN, Nicole Kindred A, DO, 0.25 mg at 06/24/21 1626   azithromycin  (ZITHROMAX) tablet 500 mg, 500 mg, Oral, Daily, Benita Gutter, RPH, 500 mg at 06/23/21 2100   budesonide (PULMICORT) nebulizer solution 0.25 mg, 0.25 mg, Nebulization, BID, Nicole Kindred A, DO, 0.25 mg at 06/24/21 1224   buPROPion (WELLBUTRIN) tablet 75 mg, 75 mg, Oral, BID, Nicole Kindred A, DO, 75 mg at 06/24/21 1022   diphenhydrAMINE (BENADRYL) injection 12.5 mg, 12.5 mg, Intravenous, Q8H PRN, Nicole Kindred A, DO, 12.5 mg at 06/22/21 1703   docusate sodium (COLACE) capsule 100 mg, 100 mg, Oral, BID, [name], DO, 100 mg at 06/24/21 1016   escitalopram (LEXAPRO) tablet 20 mg, 20 mg, Oral, Daily, Nicole Kindred A, DO, 20 mg at 06/24/21 1014   furosemide (LASIX) tablet 40 mg, 40 mg, Oral, Daily, Jonelle Sidle, Mohammad L, MD, 40 mg at 06/23/21 1502   gabapentin (NEURONTIN) capsule 400 mg, 400 mg, Oral, TID, Nicole Kindred A, DO, 400 mg at 06/24/21 1608   guaiFENesin (MUCINEX) 12 hr tablet 600 mg, 600 mg, Oral, BID, Jonelle Sidle, Mohammad L, MD, 600 mg at 06/24/21 1015   HYDROcodone-acetaminophen (NORCO/VICODIN) 5-325 MG per tablet 1-2 tablet, 1-2 tablet, Oral, Q6H PRN, Nicole Kindred A, DO, 2 tablet at 06/24/21 1015   ipratropium-albuterol (DUONEB) 0.5-2.5 (3) MG/3ML nebulizer solution 3 mL, 3 mL, Nebulization, Q6H, Garba, Mohammad L, MD, 3 mL at 06/24/21 1605   lip balm (BLISTEX) ointment, , Topical, PRN, Madueme, Elvira C, RPH   methylPREDNISolone sodium succinate (SOLU-MEDROL) 40 mg/mL injection 40 mg, 40 mg, Intravenous, Q12H, Garba, Mohammad L, MD, 40 mg at 06/24/21 0230   metoprolol tartrate (LOPRESSOR) injection 5 mg, 5 mg, Intravenous, Q6H PRN, Jonelle Sidle, Mohammad L, MD   morphine (PF) 2 MG/ML injection 2 mg, 2 mg, Intravenous, Q2H PRN, Nicole Kindred A, DO, 2 mg at 06/24/21 0925   ondansetron (ZOFRAN) tablet 4 mg, 4 mg, Oral, Q6H PRN **OR** ondansetron (ZOFRAN) injection 4 mg, 4 mg, Intravenous, Q6H PRN, Jonelle Sidle, Mohammad L, MD, 4 mg at 06/23/21 2214   oxyCODONE (Oxy IR/ROXICODONE)  immediate release tablet 5-10 mg, 5-10 mg, Oral, Q4H PRN, Nicole Kindred A, DO, 5 mg at 06/22/21 1359   pantoprazole (PROTONIX) EC tablet 40 mg, 40 mg, Oral, BID, Nicole Kindred A, DO, 40 mg at 06/24/21 1016   polyethylene glycol (MIRALAX / GLYCOLAX) packet 17 g, 17 g, Oral, BID, [name], DO, 17 g at 06/24/21 1014   polyethylene glycol-electrolytes (NuLYTELY) solution 2,000 mL, 2,000 mL, Oral, Once PRN, [name], DO   potassium chloride 10 mEq in 100 mL IVPB, 10 mEq, Intravenous, Once, Gala Romney L, MD   QUEtiapine (SEROQUEL) tablet 25 mg, 25 mg, Oral, QHS, Griffith, Kelly A, DO, 25 mg at 06/23/21 2100  roflumilast (DALIRESP) tablet 500 mcg, 500 mcg, Oral, Daily, Nicole Kindred A, DO, 500 mcg at 06/24/21 1021    ALLERGIES   Other     REVIEW OF SYSTEMS    Review of Systems:  Gen:  Denies  fever, sweats, chills weigh loss  HEENT: Denies blurred vision, double vision, ear pain, eye pain, hearing loss, nose bleeds, sore throat Cardiac:  No dizziness, chest pain or heaviness, chest tightness,edema Resp:   reports dyspnea chronically  Gi: Denies swallowing difficulty, stomach pain, nausea or vomiting, diarrhea, constipation, bowel incontinence Gu:  Denies bladder incontinence, burning urine Ext:   Denies Joint pain, stiffness or swelling Skin: Denies  skin rash, easy bruising or bleeding or hives Endoc:  Denies polyuria, polydipsia , polyphagia or weight change Psych:   Denies depression, insomnia or hallucinations   Other:  All other systems negative   VS: BP (!) 104/59 (BP Location: Right Arm)   Pulse 89   Temp 98 F (36.7 C)   Resp 18   Ht '5\' 4"'$  (1.626 m)   Wt 81.6 kg   SpO2 97%   BMI 30.88 kg/m      PHYSICAL EXAM    GENERAL:NAD, no fevers, chills, no weakness no fatigue HEAD: Normocephalic, atraumatic.  EYES: Pupils equal, round, reactive to light. Extraocular muscles intact. No scleral icterus.  MOUTH: Moist mucosal membrane.  Dentition intact. No abscess noted.  EAR, NOSE, THROAT: Clear without exudates. No external lesions.  NECK: Supple. No thyromegaly. No nodules. No JVD.  PULMONARY: decreased breath sounds with mild rhonchi worse at bases bilaterally.  CARDIOVASCULAR: S1 and S2. Regular rate and rhythm. No murmurs, rubs, or gallops. No edema. Pedal pulses 2+ bilaterally.  GASTROINTESTINAL: Soft, nontender, nondistended. No masses. Positive bowel sounds. No hepatosplenomegaly.  MUSCULOSKELETAL: No swelling, clubbing, or edema. Range of motion full in all extremities.  NEUROLOGIC: Cranial nerves II through XII are intact. No gross focal neurological deficits. Sensation intact. Reflexes intact.  SKIN: No ulceration, lesions, rashes, or cyanosis. Skin warm and dry. Turgor intact.  PSYCHIATRIC: Mood, affect within normal limits. The patient is awake, alert and oriented x 3. Insight, judgment intact.       IMAGING      ASSESSMENT/PLAN   Acute on chronic hypoxemic respiratory failure    -Reviewed CT chest with findings of chronic bronchitic changes and bilateral bronchiectasis and tree-in-bud opacification.  I did not see changes that are consistent with a bronchogenic carcinoma and rather mucoid impaction is seen bilaterally.  I would treat with antibiotics for now as well as mucolytic's and have outpatient work-up with repeat imaging in 6 to 8 weeks after steroids and antibiotics -Patient should have immunologic work-up as this can be seen in  hypogammaglobulinemia and other autoimmune diseases such as Crohn's/UC -Rarely mycobacterial infection chronically can appear this way as well -I agree with Zithromax and Rocephin empirically for CAP coverage and Solu-Medrol 40 mg every 12 -Mucinex 600 twice daily -There is absence of interstitial pulmonary edema -Bronchopulmonary hygiene with incentive spirometry and flutter valve -Microbiology work-up with respiratory cultures, MRSA PCR, procalcitonin trend, Fungitell  and Aspergillus antibody -Patient may require airway inspection and bronchoalveolar lavage via bronchoscopy on outpatient.  -she quit smoking 12 months ago      Pulmonary pre-operative evaluation    - patient is low risk for EGD/Colonoscopy and is able to have anesthesia    Thank you for allowing me to participate in the care of this patient.   Patient/Family are satisfied with care  plan and all questions have been answered.    Provider disclosure: Patient with at least one acute or chronic illness or injury that poses a threat to life or bodily function and is being managed actively during this encounter.  All of the below services have been performed independently by signing provider:  review of prior documentation from internal and or external health records.  Review of previous and current lab results.  Interview and comprehensive assessment during patient visit today. Review of current and previous chest radiographs/CT scans. Discussion of management and test interpretation with health care team and patient/family.   This document was prepared using Dragon voice recognition software and may include unintentional dictation errors.     Ottie Glazier, M.D.  Division of Pulmonary & Critical Care Medicine

## 2021-06-25 ENCOUNTER — Encounter: Payer: Self-pay | Admitting: Gastroenterology

## 2021-06-25 LAB — CULTURE, BLOOD (ROUTINE X 2)
Culture: NO GROWTH
Culture: NO GROWTH

## 2021-06-25 LAB — SURGICAL PATHOLOGY

## 2021-06-26 LAB — SURGICAL PATHOLOGY

## 2021-10-16 ENCOUNTER — Emergency Department: Payer: Commercial Managed Care - HMO

## 2021-10-16 ENCOUNTER — Inpatient Hospital Stay
Admission: EM | Admit: 2021-10-16 | Discharge: 2021-10-19 | DRG: 291 | Disposition: A | Discharge status: Home / Self Care | Payer: Commercial Managed Care - HMO | Attending: Internal Medicine | Admitting: Internal Medicine

## 2021-10-16 DIAGNOSIS — G4733 Obstructive sleep apnea (adult) (pediatric): Secondary | ICD-10-CM | POA: Diagnosis present

## 2021-10-16 DIAGNOSIS — Z79899 Other long term (current) drug therapy: Secondary | ICD-10-CM

## 2021-10-16 DIAGNOSIS — J441 Chronic obstructive pulmonary disease with (acute) exacerbation: Secondary | ICD-10-CM | POA: Diagnosis present

## 2021-10-16 DIAGNOSIS — G894 Chronic pain syndrome: Secondary | ICD-10-CM | POA: Diagnosis present

## 2021-10-16 DIAGNOSIS — K435 Parastomal hernia without obstruction or  gangrene: Secondary | ICD-10-CM | POA: Diagnosis present

## 2021-10-16 DIAGNOSIS — R109 Unspecified abdominal pain: Secondary | ICD-10-CM

## 2021-10-16 DIAGNOSIS — Z888 Allergy status to other drugs, medicaments and biological substances status: Secondary | ICD-10-CM | POA: Diagnosis not present

## 2021-10-16 DIAGNOSIS — Z87891 Personal history of nicotine dependence: Secondary | ICD-10-CM | POA: Diagnosis not present

## 2021-10-16 DIAGNOSIS — E876 Hypokalemia: Secondary | ICD-10-CM | POA: Diagnosis present

## 2021-10-16 DIAGNOSIS — Z8249 Family history of ischemic heart disease and other diseases of the circulatory system: Secondary | ICD-10-CM

## 2021-10-16 DIAGNOSIS — I5033 Acute on chronic diastolic (congestive) heart failure: Secondary | ICD-10-CM | POA: Diagnosis present

## 2021-10-16 DIAGNOSIS — G629 Polyneuropathy, unspecified: Secondary | ICD-10-CM | POA: Diagnosis present

## 2021-10-16 DIAGNOSIS — F1110 Opioid abuse, uncomplicated: Secondary | ICD-10-CM | POA: Diagnosis present

## 2021-10-16 DIAGNOSIS — J9621 Acute and chronic respiratory failure with hypoxia: Secondary | ICD-10-CM | POA: Diagnosis present

## 2021-10-16 DIAGNOSIS — K219 Gastro-esophageal reflux disease without esophagitis: Secondary | ICD-10-CM | POA: Diagnosis present

## 2021-10-16 DIAGNOSIS — Z933 Colostomy status: Secondary | ICD-10-CM | POA: Diagnosis not present

## 2021-10-16 DIAGNOSIS — R451 Restlessness and agitation: Secondary | ICD-10-CM | POA: Diagnosis present

## 2021-10-16 DIAGNOSIS — R0602 Shortness of breath: Principal | ICD-10-CM

## 2021-10-16 DIAGNOSIS — Z9049 Acquired absence of other specified parts of digestive tract: Secondary | ICD-10-CM

## 2021-10-16 DIAGNOSIS — I110 Hypertensive heart disease with heart failure: Secondary | ICD-10-CM | POA: Diagnosis present

## 2021-10-16 DIAGNOSIS — J9622 Acute and chronic respiratory failure with hypercapnia: Secondary | ICD-10-CM | POA: Diagnosis present

## 2021-10-16 DIAGNOSIS — Z833 Family history of diabetes mellitus: Secondary | ICD-10-CM | POA: Diagnosis not present

## 2021-10-16 DIAGNOSIS — Z83438 Family history of other disorder of lipoprotein metabolism and other lipidemia: Secondary | ICD-10-CM | POA: Diagnosis not present

## 2021-10-16 DIAGNOSIS — Z20822 Contact with and (suspected) exposure to covid-19: Secondary | ICD-10-CM | POA: Diagnosis present

## 2021-10-16 DIAGNOSIS — D638 Anemia in other chronic diseases classified elsewhere: Secondary | ICD-10-CM | POA: Diagnosis present

## 2021-10-16 DIAGNOSIS — Z825 Family history of asthma and other chronic lower respiratory diseases: Secondary | ICD-10-CM

## 2021-10-16 DIAGNOSIS — F068 Other specified mental disorders due to known physiological condition: Secondary | ICD-10-CM | POA: Diagnosis present

## 2021-10-16 DIAGNOSIS — R0689 Other abnormalities of breathing: Secondary | ICD-10-CM | POA: Diagnosis present

## 2021-10-16 DIAGNOSIS — F064 Anxiety disorder due to known physiological condition: Secondary | ICD-10-CM | POA: Diagnosis present

## 2021-10-16 DIAGNOSIS — J9612 Chronic respiratory failure with hypercapnia: Secondary | ICD-10-CM | POA: Diagnosis present

## 2021-10-16 LAB — BLOOD GAS, ARTERIAL
Acid-Base Excess: 23 mmol/L — ABNORMAL HIGH (ref 0.0–2.0)
Bicarbonate: 51.9 mmol/L — ABNORMAL HIGH (ref 20.0–28.0)
O2 Content: 3 L/min
O2 Saturation: 90.4 %
Patient temperature: 37
pCO2 arterial: 80 mmHg (ref 32–48)
pH, Arterial: 7.42 (ref 7.35–7.45)
pO2, Arterial: 59 mmHg — ABNORMAL LOW (ref 83–108)

## 2021-10-16 LAB — CBC WITH DIFFERENTIAL/PLATELET
Abs Immature Granulocytes: 0.03 10*3/uL (ref 0.00–0.07)
Basophils Absolute: 0 10*3/uL (ref 0.0–0.1)
Basophils Relative: 0 %
Eosinophils Absolute: 0.1 10*3/uL (ref 0.0–0.5)
Eosinophils Relative: 1 %
HCT: 41.2 % (ref 36.0–46.0)
Hemoglobin: 11.8 g/dL — ABNORMAL LOW (ref 12.0–15.0)
Immature Granulocytes: 0 %
Lymphocytes Relative: 27 %
Lymphs Abs: 2.5 10*3/uL (ref 0.7–4.0)
MCH: 29.5 pg (ref 26.0–34.0)
MCHC: 28.6 g/dL — ABNORMAL LOW (ref 30.0–36.0)
MCV: 103 fL — ABNORMAL HIGH (ref 80.0–100.0)
Monocytes Absolute: 0.7 10*3/uL (ref 0.1–1.0)
Monocytes Relative: 8 %
Neutro Abs: 5.8 10*3/uL (ref 1.7–7.7)
Neutrophils Relative %: 64 %
Platelets: 238 10*3/uL (ref 150–400)
RBC: 4 MIL/uL (ref 3.87–5.11)
RDW: 14.6 % (ref 11.5–15.5)
WBC: 9.2 10*3/uL (ref 4.0–10.5)
nRBC: 0 % (ref 0.0–0.2)

## 2021-10-16 LAB — BASIC METABOLIC PANEL
Anion gap: 9 (ref 5–15)
BUN: 7 mg/dL (ref 6–20)
CO2: 39 mmol/L — ABNORMAL HIGH (ref 22–32)
Calcium: 8.7 mg/dL — ABNORMAL LOW (ref 8.9–10.3)
Chloride: 92 mmol/L — ABNORMAL LOW (ref 98–111)
Creatinine, Ser: 0.44 mg/dL (ref 0.44–1.00)
GFR, Estimated: >60 mL/min (ref >60)
Glucose, Bld: 117 mg/dL — ABNORMAL HIGH (ref 70–99)
Potassium: 2.9 mmol/L — ABNORMAL LOW (ref 3.5–5.1)
Sodium: 140 mmol/L (ref 135–145)

## 2021-10-16 LAB — BRAIN NATRIURETIC PEPTIDE: B Natriuretic Peptide: 79.9 pg/mL (ref 0.0–100.0)

## 2021-10-16 LAB — SARS CORONAVIRUS 2 BY RT PCR: SARS Coronavirus 2 by RT PCR: NEGATIVE

## 2021-10-16 LAB — TROPONIN I (HIGH SENSITIVITY): Troponin I (High Sensitivity): 7 ng/L (ref <18)

## 2021-10-16 LAB — MAGNESIUM: Magnesium: 1.7 mg/dL (ref 1.7–2.4)

## 2021-10-16 MED ORDER — FUROSEMIDE 10 MG/ML IJ SOLN
20.0000 mg | Freq: Two times a day (BID) | INTRAMUSCULAR | Status: DC
  Administered 2021-10-17: 20 mg via INTRAVENOUS
  Filled 2021-10-16: qty 2

## 2021-10-16 MED ORDER — FUROSEMIDE 10 MG/ML IJ SOLN
60.0000 mg | Freq: Once | INTRAMUSCULAR | Status: AC
  Administered 2021-10-16: 60 mg via INTRAVENOUS
  Filled 2021-10-16: qty 8

## 2021-10-16 MED ORDER — POTASSIUM CHLORIDE CRYS ER 20 MEQ PO TBCR
20.0000 meq | EXTENDED_RELEASE_TABLET | Freq: Every day | ORAL | Status: DC
  Administered 2021-10-17 – 2021-10-19 (×3): 20 meq via ORAL
  Filled 2021-10-16 (×3): qty 1

## 2021-10-16 MED ORDER — POTASSIUM CHLORIDE CRYS ER 20 MEQ PO TBCR: 40.0000 meq | EXTENDED_RELEASE_TABLET | Freq: Once | ORAL | Status: DC

## 2021-10-16 MED ORDER — ESCITALOPRAM OXALATE 10 MG PO TABS: 20.0000 mg | ORAL_TABLET | Freq: Every day | ORAL | Status: DC

## 2021-10-16 MED ORDER — PREDNISONE 20 MG PO TABS
40.0000 mg | ORAL_TABLET | Freq: Every day | ORAL | Status: DC
  Administered 2021-10-17 – 2021-10-19 (×3): 40 mg via ORAL
  Filled 2021-10-16 (×3): qty 2

## 2021-10-16 MED ORDER — ACETAMINOPHEN 325 MG PO TABS
650.0000 mg | ORAL_TABLET | Freq: Four times a day (QID) | ORAL | Status: DC | PRN
  Administered 2021-10-18: 650 mg via ORAL
  Filled 2021-10-16: qty 2

## 2021-10-16 MED ORDER — ONDANSETRON HCL 4 MG/2ML IJ SOLN: 4.0000 mg | Freq: Four times a day (QID) | INTRAMUSCULAR | Status: DC | PRN

## 2021-10-16 MED ORDER — OXYCODONE HCL 5 MG PO TABS
5.0000 mg | ORAL_TABLET | Freq: Four times a day (QID) | ORAL | Status: DC | PRN
  Administered 2021-10-17: 5 mg via ORAL
  Filled 2021-10-16: qty 1

## 2021-10-16 MED ORDER — BUPROPION HCL 75 MG PO TABS
75.0000 mg | ORAL_TABLET | Freq: Two times a day (BID) | ORAL | Status: DC
  Administered 2021-10-17 – 2021-10-19 (×5): 75 mg via ORAL
  Filled 2021-10-16 (×6): qty 1

## 2021-10-16 MED ORDER — IPRATROPIUM-ALBUTEROL 0.5-2.5 (3) MG/3ML IN SOLN
3.0000 mL | Freq: Four times a day (QID) | RESPIRATORY_TRACT | Status: DC
  Administered 2021-10-17 – 2021-10-19 (×10): 3 mL via RESPIRATORY_TRACT
  Filled 2021-10-16 (×11): qty 3

## 2021-10-16 MED ORDER — METHYLPREDNISOLONE SODIUM SUCC 125 MG IJ SOLR
80.0000 mg | INTRAMUSCULAR | Status: AC
  Administered 2021-10-16: 80 mg via INTRAVENOUS
  Filled 2021-10-16: qty 2

## 2021-10-16 MED ORDER — ROFLUMILAST 500 MCG PO TABS
500.0000 ug | ORAL_TABLET | Freq: Every day | ORAL | Status: DC
  Administered 2021-10-17 – 2021-10-19 (×3): 500 ug via ORAL
  Filled 2021-10-16 (×3): qty 1

## 2021-10-16 MED ORDER — ENOXAPARIN SODIUM 40 MG/0.4ML IJ SOSY
40.0000 mg | PREFILLED_SYRINGE | INTRAMUSCULAR | Status: DC
  Administered 2021-10-17 – 2021-10-18 (×2): 40 mg via SUBCUTANEOUS
  Filled 2021-10-16 (×2): qty 0.4

## 2021-10-16 MED ORDER — ACETAMINOPHEN 650 MG RE SUPP: 650.0000 mg | Freq: Four times a day (QID) | RECTAL | Status: DC | PRN

## 2021-10-16 MED ORDER — ONDANSETRON HCL 4 MG PO TABS: 4.0000 mg | ORAL_TABLET | Freq: Four times a day (QID) | ORAL | Status: DC | PRN

## 2021-10-16 MED ORDER — ALBUTEROL SULFATE (2.5 MG/3ML) 0.083% IN NEBU: 2.5000 mg | INHALATION_SOLUTION | RESPIRATORY_TRACT | Status: DC | PRN

## 2021-10-16 MED ORDER — ACETAMINOPHEN 500 MG PO TABS
1000.0000 mg | ORAL_TABLET | Freq: Once | ORAL | Status: AC
  Administered 2021-10-16: 1000 mg via ORAL
  Filled 2021-10-16: qty 2

## 2021-10-16 MED ORDER — LORAZEPAM 2 MG/ML IJ SOLN
0.5000 mg | Freq: Once | INTRAMUSCULAR | Status: AC
  Administered 2021-10-16: 0.5 mg via INTRAVENOUS
  Filled 2021-10-16: qty 1

## 2021-10-16 MED ORDER — POTASSIUM CHLORIDE 10 MEQ/100ML IV SOLN
10.0000 meq | INTRAVENOUS | Status: AC
  Administered 2021-10-16 (×3): 10 meq via INTRAVENOUS
  Filled 2021-10-16 (×4): qty 100

## 2021-10-16 NOTE — Assessment & Plan Note (Signed)
Continue Neurontin and as needed oxycodone

## 2021-10-16 NOTE — Assessment & Plan Note (Signed)
Hemoglobin stable at 11.8

## 2021-10-16 NOTE — ED Notes (Signed)
Pt assisted to [facility] to urinate. Pt colostomy bag emptied. Pt assisted back to bed and placed on cardiac monitor. Call light within reach. Pt has no further needs at this time.

## 2021-10-16 NOTE — ED Provider Notes (Signed)
[facility] Provider Note    Event Date/Time   First MD Initiated Contact with Patient 10/16/21 1538     (approximate)   History   Shortness of Breath (Copd, x 3 hours, 84% 4lnc on arrival, pt wears 3lnc )   HPI  Misty Green is a 51 y.o. female  who presents to the emergency department today because of concern for shortness of breath.  Symptoms have been getting progressively worse over the past 2 weeks.  Patient does wear 3 L of oxygen at home.  She also has a CPAP machine although feels it has not been working appropriately recently.  When she uses it her oxygen levels drop lower than when she just uses her oxygen.  Patient has also had some confusion that the family has noticed.  She has had elevated CO2 levels in the past.  Furthermore the patient is complaining of abdominal pain.  This makes it hard for her to sit straight up.      Physical Exam   Triage Vital Signs: ED Triage Vitals  Enc Vitals Group     BP 10/16/21 1504 133/82     Pulse Rate 10/16/21 1501 78     Resp 10/16/21 1501 (!) 25     Temp 10/16/21 1501 98.5 F (36.9 C)     Temp Source 10/16/21 1501 Oral     SpO2 10/16/21 1501 99 %     Weight --      Height --      Head Circumference --      Peak Flow --      Pain Score 10/16/21 1501 0     Pain Loc --      Pain Edu? --      Excl. in [place]? --     Most recent vital signs: Vitals:   10/16/21 1501 10/16/21 1504  BP:  133/82  Pulse: 78   Resp: (!) 25   Temp: 98.5 F (36.9 C)   SpO2: 99%    General: Awake, alert, oriented. CV:  Good peripheral perfusion. Regular rate and rhythm. Resp:  Increased work of breathing. Abd:  No distention. Non tender.    ED Results / Procedures / Treatments   Labs (all labs ordered are listed, but only abnormal results are displayed) Labs Reviewed  BASIC METABOLIC PANEL - Abnormal; Notable for the following components:      Result Value   Potassium 2.9 (*)    Chloride 92 (*)    CO2  39 (*)    Glucose, Bld 117 (*)    Calcium 8.7 (*)    All other components within normal limits  BLOOD GAS, ARTERIAL - Abnormal; Notable for the following components:   pCO2 arterial 80 (*)    pO2, Arterial 59 (*)    Bicarbonate 51.9 (*)    Acid-Base Excess 23.0 (*)    All other components within normal limits  CBC WITH DIFFERENTIAL/PLATELET - Abnormal; Notable for the following components:   Hemoglobin 11.8 (*)    MCV 103.0 (*)    MCHC 28.6 (*)    All other components within normal limits  SARS CORONAVIRUS 2 BY RT PCR  BRAIN NATRIURETIC PEPTIDE  MAGNESIUM  CBC WITH DIFFERENTIAL/PLATELET  TROPONIN I (HIGH SENSITIVITY)  TROPONIN I (HIGH SENSITIVITY)     EKG  I, Nance Pear, attending physician, personally viewed and interpreted this EKG  EKG Time: 1507 Rate: 74 Rhythm: sinus rhythm Axis: normal Intervals: qtc 455 QRS: narrow  ST changes: no st elevation Impression: abnormal ekg   RADIOLOGY I independently interpreted and visualized the CXR. My interpretation: No consolidation. Radiology interpretation:  IMPRESSION:  1. Borderline cardiomegaly with vascular congestion and mild diffuse  interstitial process either due to minimal edema or inflammation.  Trace pleural effusions versus pleural thickening.      PROCEDURES:  Critical Care performed: No  Procedures   MEDICATIONS ORDERED IN ED: Medications - No data to display   IMPRESSION / MDM / [place] / ED COURSE  I reviewed the triage vital signs and the nursing notes.                              Differential diagnosis includes, but is not limited to, COPD, CHF, pneumonia, pneumothorax, covid.  Patient's presentation is most consistent with acute presentation with potential threat to life or bodily function.  Patient presented to the emergency department today because of concerns for shortness of breath.  Patient's family also has noticed some confusion.  Patient's chest x-ray here is  concerning for some pulmonary edema.  Patient without any lower extremity swelling.  However given concern for edema did give Lasix.  Additionally check an ABG given confusion patient was found to be hypercapnic.  Because of this I asked respiratory to place patient on BiPAP.  Discussed with Dr. Damita Dunnings with the hospitalist service will plan on admission.   FINAL CLINICAL IMPRESSION(S) / ED DIAGNOSES   Final diagnoses:  SOB (shortness of breath)  Hypercapnia    Note:  This document was prepared using Dragon voice recognition software and may include unintentional dictation errors.    Nance Pear, MD 10/16/21 2013

## 2021-10-16 NOTE — H&P (Signed)
History and Physical    Patient: Misty Green:[id number] DOB: 04-05-1970 DOA: 10/16/2021 DOS: the patient was seen and examined on 10/16/2021 PCP: [name], MD  Patient coming from: Home  Chief Complaint:  Chief Complaint  Patient presents with   Shortness of Breath    Copd, x 3 hours, 84% 4lnc on arrival, pt wears 3lnc     HPI: Misty Green is a 51 y.o. female with medical history significant for chronic respiratory failure secondary to COPD on 3 L/min O2 at baseline and trilogy when sleeping, hypertension, diastolic CHF, peripheral neuropathy, chronic pain syndrome, history of cardiac arrest, OSA, GERD, remote tobacco abuse, history of diverticulitis status post partial resection with colostomy who presents to the ED with shortness of breath and wheezing unrelieved with home bronchodilator treatments.  Denies fever or chills or chest pain.  On arrival of EMS O2 sat was 84% on 4 L.  DuoNebs and Solu-Medrol 125 mg administered in route. ED course and data review: Respiratory rate 25 to 29% with O2 sat in the high 90s on 4 L.  ABG on 4 L with pH 7.42, PCO2 88 and PO2 59.  CBC unremarkable with baseline heme globin of 11.8.  BMP significant for potassium of 2.9 and elevated bicarb of 39.  Troponin 7, BNP 79.9, magnesium 1.7.EKG, personally reviewed and interpreted showing sinus at 79 with nonspecific ST-T wave changes.  Chest x-ray showing the following IMPRESSION: 1. Borderline cardiomegaly with vascular congestion and mild diffuse interstitial process either due to minimal edema or inflammation. Trace pleural effusions versus pleural thickening  Patient was started on BiPAP.  Treated with an additional DuoNeb.  Also given a dose of Lasix based on chest x-ray findings concerning for mild interstitial edema and started on a 10 mill equivalent potassium rider.  Hospitalist consulted for admission.   Review of Systems: As mentioned in the history of present illness. All  other systems reviewed and are negative.  Past Medical History:  Diagnosis Date   Cardiac arrest ([place]) 05/12/2018   CHF (congestive heart failure) (HCC)    Chicken pox    COPD (chronic obstructive pulmonary disease) (HCC)    Current smoker    Dyspnea    GERD (gastroesophageal reflux disease)    Hypertension    Malfunction of colostomy and enterostomy (HCC)    Opiate abuse, continuous ([place])    Oxygen deficit    Sleep apnea    Past Surgical History:  Procedure Laterality Date   ABDOMINAL HYSTERECTOMY     BACK SURGERY     cesction     COLONOSCOPY WITH PROPOFOL N/A 06/24/2021   Procedure: COLONOSCOPY WITH PROPOFOL;  Surgeon: [name], DO;  Location: [facility] ENDOSCOPY;  Service: Gastroenterology;  Laterality: N/A;  through stoma   COLOSTOMY  01/13/2018   Procedure: COLOSTOMY Creation;  Surgeon: Jules Husbands, MD;  Location: [facility] ORS;  Service: General;;   ESOPHAGOGASTRODUODENOSCOPY (EGD) WITH PROPOFOL N/A 06/23/2021   Procedure: ESOPHAGOGASTRODUODENOSCOPY (EGD) WITH PROPOFOL;  Surgeon: [name], DO;  Location: [place];  Service: Gastroenterology;  Laterality: N/A;   HERNIA REPAIR     INCISIONAL HERNIA REPAIR     lower midline laparotomy incision (hysterectomy), repaired with mesh   IR FLUORO GUIDE CV LINE RIGHT  05/26/2018   IR REMOVAL TUN CV CATH W/O FL  06/02/2018   IR US GUIDE VASC ACCESS RIGHT  05/26/2018   LAPAROSCOPIC CHOLECYSTECTOMY  2012   LAPAROSCOPIC LYSIS OF ADHESIONS  01/13/2018   Procedure:  LAPAROSCOPIC LYSIS OF ADHESIONS;  Surgeon: Jules Husbands, MD;  Location: [facility] ORS;  Service: General;;   LAPAROSCOPIC SIGMOID COLECTOMY  01/13/2018   Procedure: LAPAROSCOPIC SIGMOID COLECTOMY;  Surgeon: Jules Husbands, MD;  Location: [facility] ORS;  Service: General;;   LUMBAR SPINE SURGERY     ORIF WRIST FRACTURE Right 08/25/2015   Procedure: OPEN REDUCTION INTERNAL FIXATION (ORIF) WRIST FRACTURE;  Surgeon: Corky Mull, MD;  Location: [facility] ORS;  Service:  Orthopedics;  Laterality: Right;   OVARIAN CYST REMOVAL     Social History:  reports that she has quit smoking. Her smoking use included cigarettes. She has a 15.00 pack-year smoking history. She has never used smokeless tobacco. She reports current alcohol use. She reports current drug use. Drug: Marijuana.  Allergies  Allergen Reactions   Other Nausea And Vomiting    Anti-depressent that starts with t    Family History  Problem Relation Age of Onset   Diabetes Mother    COPD Mother    Hypertension Father    Diabetes Father    Heart disease Father    Hyperlipidemia Father    Prostate cancer Father     Prior to Admission medications   Medication Sig Start Date End Date Taking? Authorizing Provider  acetaminophen (TYLENOL) 325 MG tablet Take 1-2 tablets (325-650 mg total) by mouth every 4 (four) hours as needed for mild pain. Patient taking differently: Take 650 mg by mouth 3 (three) times daily. 06/05/18   Love, Ivan Anchors, PA-C  albuterol (PROVENTIL) (2.5 MG/3ML) 0.083% nebulizer solution Take 2.5 mg by nebulization every 6 (six) hours as needed for wheezing or shortness of breath. Inhale one vial via nebulizer 4 times a day as directed. Patient not taking: Reported on 06/20/2021    [provider]  albuterol (VENTOLIN HFA) 108 (90 Base) MCG/ACT inhaler Inhale 2 puffs into the lungs every 6 (six) hours as needed for wheezing or shortness of breath. 06/24/21   [name], MD  ALPRAZolam Duanne Moron) 0.25 MG tablet Take 1 tablet (0.25 mg total) by mouth 3 (three) times daily as needed for anxiety or sleep. 06/24/21   [name], MD  buPROPion (WELLBUTRIN) 75 MG tablet Take 75 mg by mouth 2 (two) times daily.    [provider]  DALIRESP 500 MCG TABS tablet TAKE 1 TABLET BY MOUTH DAILY Patient taking differently: Take 500 mcg by mouth daily. 08/27/19   Lavera Guise, MD  escitalopram Loma Sousa) 20 MG tablet One '20mg'$  tablet po daily 06/24/21   [name], MD   Fluticasone-Umeclidin-Vilant (TRELEGY ELLIPTA) 100-62.5-25 MCG/INH AEPB Inhale 1 each into the lungs daily. 07/19/19   Kendell Bane, NP  furosemide (LASIX) 40 MG tablet Take 40 mg by mouth daily. 01/20/20   [provider]  gabapentin (NEURONTIN) 400 MG capsule Take 1 capsule (400 mg total) by mouth 3 (three) times daily. 07/12/19   Mercy Riding, MD  Hyoscyamine Sulfate SL 0.125 MG SUBL Place under the tongue every 6 (six) hours as needed.    [provider]  Melatonin 10 MG TABS Take 30 mg by mouth at bedtime.    [provider]  Multiple Vitamins-Minerals (MULTIVITAMIN WITH MINERALS) tablet Take 1 tablet by mouth at bedtime.  Patient not taking: Reported on 06/20/2021    [provider]  ondansetron (ZOFRAN) 4 MG tablet Take 4 mg by mouth every 8 (eight) hours as needed for nausea or vomiting.    [provider]  oxyCODONE (OXY IR/ROXICODONE)  5 MG immediate release tablet Take 1 tablet (5 mg total) by mouth every 6 (six) hours as needed for severe pain. 06/24/21   Wieting, Richard, MD  OZEMPIC, 0.25 OR 0.5 MG/DOSE, 2 MG/1.5ML SOPN Inject 0.25 mg into the skin once a week. 10/02/19   [provider]  pantoprazole (PROTONIX) 40 MG tablet Take 40 mg by mouth 2 (two) times daily. 11/11/19   [provider]  polyethylene glycol (MIRALAX / GLYCOLAX) 17 g packet Take 17 g by mouth daily. 02/12/20   Eugenie Filler, MD  potassium chloride (KLOR-CON M) 20 MEQ tablet Take 1 tablet (20 mEq total) by mouth daily. 06/24/21   [name], MD  predniSONE (DELTASONE) 10 MG tablet 3 tabs po day1; 2 tabs po day2; 1 tab po day3; 1/2 tab po day4,5 06/25/21   [name], MD  PRESCRIPTION MEDICATION Inhale into the lungs at bedtime. BiPap    [provider]  QUEtiapine (SEROQUEL) 25 MG tablet Take 1 tablet (25 mg total) by mouth at bedtime. 02/11/20   Eugenie Filler, MD    Physical Exam: Vitals:   10/16/21 1504 10/16/21 1646 10/16/21  1900 10/16/21 1930  BP: 133/82 129/89 115/67 (!) 147/70  Pulse:  83 84 93  Resp:  18 15 (!) 29  Temp:  98.3 F (36.8 C)    TempSrc:  Oral    SpO2:  96% 97% 96%   Physical Exam Vitals and nursing note reviewed.  Constitutional:      General: She is not in acute distress.    Interventions: Face mask in place.     Comments: Anxious appearing, BiPAP on  HENT:     Head: Normocephalic and atraumatic.  Cardiovascular:     Rate and Rhythm: Normal rate and regular rhythm.     Heart sounds: Normal heart sounds.  Pulmonary:     Effort: Tachypnea present.     Breath sounds: Wheezing and rhonchi present.  Abdominal:     Palpations: Abdomen is soft.     Tenderness: There is no abdominal tenderness.  Neurological:     Mental Status: Mental status is at baseline.     Labs on Admission: I have personally reviewed following labs and imaging studies  CBC: Recent Labs  Lab 10/16/21 1505  WBC 9.2  NEUTROABS 5.8  HGB 11.8*  HCT 41.2  MCV 103.0*  PLT 517   Basic Metabolic Panel: Recent Labs  Lab 10/16/21 1544  NA 140  K 2.9*  CL 92*  CO2 39*  GLUCOSE 117*  BUN 7  CREATININE 0.44  CALCIUM 8.7*  MG 1.7   GFR: CrCl cannot be calculated (Unknown ideal weight.). Liver Function Tests: No results for input(s): "AST", "ALT", "ALKPHOS", "BILITOT", "PROT", "ALBUMIN" in the last 168 hours. No results for input(s): "LIPASE", "AMYLASE" in the last 168 hours. No results for input(s): "AMMONIA" in the last 168 hours. Coagulation Profile: No results for input(s): "INR", "PROTIME" in the last 168 hours. Cardiac Enzymes: No results for input(s): "CKTOTAL", "CKMB", "CKMBINDEX", "TROPONINI" in the last 168 hours. BNP (last 3 results) No results for input(s): "PROBNP" in the last 8760 hours. HbA1C: No results for input(s): "HGBA1C" in the last 72 hours. CBG: No results for input(s): "GLUCAP" in the last 168 hours. Lipid Profile: No results for input(s): "CHOL", "HDL", "LDLCALC",  "TRIG", "CHOLHDL", "LDLDIRECT" in the last 72 hours. Thyroid Function Tests: No results for input(s): "TSH", "T4TOTAL", "FREET4", "T3FREE", "THYROIDAB" in the last 72 hours. Anemia Panel: No results  for input(s): "VITAMINB12", "FOLATE", "FERRITIN", "TIBC", "IRON", "RETICCTPCT" in the last 72 hours. Urine analysis:    Component Value Date/Time   COLORURINE STRAW (A) 07/09/2019 0337   APPEARANCEUR CLEAR 07/09/2019 0337   LABSPEC 1.005 07/09/2019 0337   PHURINE 5.0 07/09/2019 0337   GLUCOSEU NEGATIVE 07/09/2019 0337   HGBUR SMALL (A) 07/09/2019 0337   BILIRUBINUR NEGATIVE 07/09/2019 0337   KETONESUR NEGATIVE 07/09/2019 0337   PROTEINUR 100 (A) 07/09/2019 0337   NITRITE NEGATIVE 07/09/2019 0337   LEUKOCYTESUR NEGATIVE 07/09/2019 0337    Radiological Exams on Admission: DG Chest Portable 1 View  Result Date: 10/16/2021 CLINICAL DATA:  Shortness of breath EXAM: PORTABLE CHEST 1 VIEW COMPARISON:  06/20/2021, CT 06/21/2021, chest x-ray 10/05/2020 FINDINGS: Borderline to mild cardiomegaly with trace pleural effusion or thickening. Central vascular congestion. Diffuse increased interstitial opacity which could be due to mild edema or inflammation. Calcified bilateral lung nodules consistent with prior granulomatous disease. IMPRESSION: 1. Borderline cardiomegaly with vascular congestion and mild diffuse interstitial process either due to minimal edema or inflammation. Trace pleural effusions versus pleural thickening. Electronically Signed   By: Donavan Foil M.D.   On: 10/16/2021 16:19     Data Reviewed: Relevant notes from primary care and specialist visits, past discharge summaries as available in EHR, including Care Everywhere. Prior diagnostic testing as pertinent to current admission diagnoses Updated medications and problem lists for reconciliation ED course, including vitals, labs, imaging, treatment and response to treatment Triage notes, nursing and pharmacy notes and ED provider's  notes Notable results as noted in HPI   Assessment and Plan: * Acute on chronic respiratory failure with hypoxia and hypercapnia (HCC) Secondary mainly to COPD exacerbation, with some element of CHF At baseline on 3 L O2 with Trelegy at nights O2 sat 84% on 4 L with EMS, ABG on 4 L with pH 7.42, PCO2 88 and PO2 59 requiring BiPAP Continue BiPAP and wean as tolerated to baseline O2 requirement Treat COPD and CHF as outlined below  COPD exacerbation (HCC) Schedule and as needed nebulized bronchodilators IV Solu-Medrol  Acute on chronic diastolic CHF (congestive heart failure) (HCC) Clinically euvolemic and BNP WNL but chest x-ray showing borderline cardiomegaly with vascular congestion and trace pleural effusions IV Lasix Daily weights with intake and output monitoring Echocardiogram in the a.m.   Hypokalemia Oral repletion.    Colostomy status s/p partial colectomy for recurrent diverticulitis ([place]) Colostomy care  Anxiety disorder due to multiple medical problems Continue Lexapro and Wellbutrin.  Patient is on Xanax as needed Restoril use judiciously given respiratory failure  Anemia of chronic disease Hemoglobin stable at 11.8  Chronic pain syndrome Continue Neurontin and as needed oxycodone        DVT prophylaxis: Lovenox  Consults: none  Advance Care Planning:   Code Status: Prior   Family Communication:  son at the bedside  Disposition Plan: Back to previous home environment  Severity of Illness: The appropriate patient status for this patient is INPATIENT. Inpatient status is judged to be reasonable and necessary in order to provide the required intensity of service to ensure the patient's safety. The patient's presenting symptoms, physical exam findings, and initial radiographic and laboratory data in the context of their chronic comorbidities is felt to place them at high risk for further clinical deterioration. Furthermore, it is not anticipated that the  patient will be medically stable for discharge from the hospital within 2 midnights of admission.   * I certify that at the point of admission  it is my clinical judgment that the patient will require inpatient hospital care spanning beyond 2 midnights from the point of admission due to high intensity of service, high risk for further deterioration and high frequency of surveillance required.*  Author: Athena Masse, MD 10/16/2021 8:03 PM  For on call review [email or web address].

## 2021-10-16 NOTE — Assessment & Plan Note (Signed)
Colostomy care 

## 2021-10-16 NOTE — Assessment & Plan Note (Signed)
Schedule and as needed nebulized bronchodilators IV Solu-Medrol

## 2021-10-16 NOTE — Assessment & Plan Note (Signed)
Oral repletion 

## 2021-10-16 NOTE — Assessment & Plan Note (Signed)
Clinically euvolemic and BNP WNL but chest x-ray showing borderline cardiomegaly with vascular congestion and trace pleural effusions IV Lasix Daily weights with intake and output monitoring Echocardiogram in the a.m.

## 2021-10-16 NOTE — ED Triage Notes (Signed)
Copd, x 3 hours, 84% 4lnc on arrival, pt wears 3lnc ,x1  douneb given by ems, 20 g r wrist 125 mg solumedrol

## 2021-10-16 NOTE — Assessment & Plan Note (Signed)
Continue Lexapro and Wellbutrin.  --used to take Benzo in the past, but no current Rx --Xanax PRN while inpatient to avoid anxiety leading to respiratory distress

## 2021-10-17 ENCOUNTER — Other Ambulatory Visit: Payer: Self-pay

## 2021-10-17 ENCOUNTER — Inpatient Hospital Stay
Admit: 2021-10-17 | Discharge: 2021-10-17 | Disposition: A | Discharge status: Home / Self Care | Payer: Commercial Managed Care - HMO | Attending: Internal Medicine | Admitting: Internal Medicine

## 2021-10-17 ENCOUNTER — Encounter: Payer: Self-pay | Admitting: Internal Medicine

## 2021-10-17 DIAGNOSIS — J9621 Acute and chronic respiratory failure with hypoxia: Secondary | ICD-10-CM | POA: Diagnosis not present

## 2021-10-17 DIAGNOSIS — R109 Unspecified abdominal pain: Secondary | ICD-10-CM

## 2021-10-17 DIAGNOSIS — J9622 Acute and chronic respiratory failure with hypercapnia: Secondary | ICD-10-CM | POA: Diagnosis not present

## 2021-10-17 LAB — BASIC METABOLIC PANEL
Anion gap: 8 (ref 5–15)
BUN: 10 mg/dL (ref 6–20)
CO2: 44 mmol/L — ABNORMAL HIGH (ref 22–32)
Calcium: 9.1 mg/dL (ref 8.9–10.3)
Chloride: 90 mmol/L — ABNORMAL LOW (ref 98–111)
Creatinine, Ser: 0.49 mg/dL (ref 0.44–1.00)
GFR, Estimated: >60 mL/min (ref >60)
Glucose, Bld: 118 mg/dL — ABNORMAL HIGH (ref 70–99)
Potassium: 3.4 mmol/L — ABNORMAL LOW (ref 3.5–5.1)
Sodium: 142 mmol/L (ref 135–145)

## 2021-10-17 LAB — BLOOD GAS, ARTERIAL
Acid-Base Excess: 22.6 mmol/L — ABNORMAL HIGH (ref 0.0–2.0)
Bicarbonate: 52.7 mmol/L — ABNORMAL HIGH (ref 20.0–28.0)
O2 Content: 4 L/min
O2 Saturation: 94.7 %
Patient temperature: 37
pCO2 arterial: 85 mmHg (ref 32–48)
pH, Arterial: 7.4 (ref 7.35–7.45)
pO2, Arterial: 67 mmHg — ABNORMAL LOW (ref 83–108)

## 2021-10-17 LAB — ECHOCARDIOGRAM COMPLETE
AR max vel: 1.02 cm2
AV Area VTI: 1.19 cm2
AV Area mean vel: 1.16 cm2
AV Mean grad: 24 mmHg
AV Peak grad: 48.3 mmHg
Ao pk vel: 3.48 m/s
Area-P 1/2: 3.07 cm2
MV VTI: 2.3 cm2
S' Lateral: 2.8 cm
Weight: 2691.38 oz

## 2021-10-17 LAB — TROPONIN I (HIGH SENSITIVITY): Troponin I (High Sensitivity): 7 ng/L (ref <18)

## 2021-10-17 MED ORDER — FUROSEMIDE 10 MG/ML IJ SOLN
40.0000 mg | Freq: Every day | INTRAMUSCULAR | Status: DC
  Administered 2021-10-17 – 2021-10-18 (×2): 40 mg via INTRAVENOUS
  Filled 2021-10-17 (×3): qty 4

## 2021-10-17 MED ORDER — POTASSIUM CHLORIDE CRYS ER 20 MEQ PO TBCR
40.0000 meq | EXTENDED_RELEASE_TABLET | Freq: Once | ORAL | Status: AC
  Administered 2021-10-17: 40 meq via ORAL
  Filled 2021-10-17: qty 2

## 2021-10-17 MED ORDER — ORAL CARE MOUTH RINSE
15.0000 mL | OROMUCOSAL | Status: DC
  Administered 2021-10-18 – 2021-10-19 (×4): 15 mL via OROMUCOSAL

## 2021-10-17 MED ORDER — LORAZEPAM 2 MG/ML IJ SOLN
0.5000 mg | Freq: Once | INTRAMUSCULAR | Status: AC | PRN
  Administered 2021-10-17: 0.5 mg via INTRAVENOUS
  Filled 2021-10-17: qty 1

## 2021-10-17 MED ORDER — ORAL CARE MOUTH RINSE: 15.0000 mL | OROMUCOSAL | Status: DC | PRN

## 2021-10-17 MED ORDER — ALPRAZOLAM 0.25 MG PO TABS
0.2500 mg | ORAL_TABLET | Freq: Three times a day (TID) | ORAL | Status: DC | PRN
  Administered 2021-10-17 – 2021-10-18 (×5): 0.25 mg via ORAL
  Filled 2021-10-17 (×5): qty 1

## 2021-10-17 MED ORDER — KETOROLAC TROMETHAMINE 15 MG/ML IJ SOLN
15.0000 mg | Freq: Three times a day (TID) | INTRAMUSCULAR | Status: DC | PRN
  Administered 2021-10-17 – 2021-10-18 (×3): 15 mg via INTRAVENOUS
  Filled 2021-10-17 (×3): qty 1

## 2021-10-17 MED ORDER — ESCITALOPRAM OXALATE 10 MG PO TABS
20.0000 mg | ORAL_TABLET | Freq: Every day | ORAL | Status: DC
  Administered 2021-10-17 – 2021-10-19 (×3): 20 mg via ORAL
  Filled 2021-10-17 (×3): qty 2

## 2021-10-17 NOTE — Progress Notes (Signed)
  PROGRESS NOTE    Misty Green  WCH:[id number] DOB: 1970/11/19 DOA: 10/16/2021 PCP: [name], MD  250A/250A-AA  LOS: 1 day   Brief hospital course:   Assessment & Plan: Misty Green is a 51 y.o. female with medical history significant for chronic respiratory failure secondary to COPD on 3 L/min O2 at baseline and trilogy when sleeping, hypertension, diastolic CHF, peripheral neuropathy, chronic pain syndrome, history of cardiac arrest, OSA, GERD, remote tobacco abuse, history of diverticulitis status post partial resection with colostomy who presents to the ED with shortness of breath and wheezing unrelieved with home bronchodilator treatments.  Denies fever or chills or chest pain.  On arrival of EMS O2 sat was 84% on 4 L.     * Acute on chronic respiratory failure with hypoxia and hypercapnia (HCC) Secondary mainly to COPD exacerbation, with some element of CHF At baseline on 3 L O2 with Trelegy at nights O2 sat 84% on 4 L with EMS, ABG on 4 L with pH 7.42, PCO2 88 and PO2 59 requiring BiPAP --currently on heated hf Treat COPD and CHF   COPD exacerbation (HCC) --received IV solumedrol 80 mg x1 Plan: --transition to prednisone 40 mg daily Schedule and as needed nebulized  Acute on chronic diastolic CHF (congestive heart failure) (HCC) Clinically euvolemic and BNP WNL but chest x-ray showing borderline cardiomegaly with vascular congestion and trace pleural effusions --cont IV lasix 40 mg daily  Hypokalemia --monitor and replete PRN  Abdominal pain --likely 2/2 Parastomal hernia  --IV toradol PRN    Colostomy status s/p partial colectomy for recurrent diverticulitis ([place]) Colostomy care  Anxiety disorder due to multiple medical problems Continue Lexapro and Wellbutrin.  --no current Benzo Rx  Anemia of chronic disease Hemoglobin stable at 11.8  Chronic pain syndrome --no current opioids Rx   DVT prophylaxis: Lovenox SQ Code Status: Full code   Family Communication:  Level of care: Progressive Dispo:   The patient is from: home Anticipated d/c is to: home Anticipated d/c date is: 2-3 days   Subjective and Interval History:  Pt complained of pain over left abdomen that's located superficially.     Objective: Vitals:   10/17/21 0610 10/17/21 0732 10/17/21 0810 10/17/21 1351  BP:   (!) 98/58   Pulse:   86   Resp:   19   Temp:   98.7 F (37.1 C)   TempSrc:      SpO2: 93% 92% 91% 95%  Weight:       No intake or output data in the 24 hours ending 10/17/21 1740 Filed Weights   10/16/21 2000  Weight: 76.3 kg    Examination:   Constitutional: NAD, AAOx3 HEENT: conjunctivae and lids normal, EOMI CV: No cyanosis.   RESP: very reduced lung sounds, no wheezes, on heated hf GI: ostomy bag with dark brown mushy stool Neuro: II - XII grossly intact.   Psych: depressed mood and affect.     Data Reviewed: I have personally reviewed labs and imaging studies  Time spent: 50 minutes  Enzo Bi, MD [facility] If 7PM-7AM, please contact night-coverage 10/17/2021, 5:40 PM

## 2021-10-17 NOTE — Progress Notes (Signed)
Pt placed on HFNC per MD request
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
## 2021-10-17 NOTE — Progress Notes (Signed)
Pt very confused and agitated. Constantly pulling at wires, etc.

## 2021-10-17 NOTE — Assessment & Plan Note (Addendum)
--  pain located near the surface over left abdomen atove the stoma, likely 2/2 Parastomal hernia  --IV toradol PRN

## 2021-10-18 DIAGNOSIS — J9621 Acute and chronic respiratory failure with hypoxia: Secondary | ICD-10-CM | POA: Diagnosis not present

## 2021-10-18 LAB — MAGNESIUM: Magnesium: 1.9 mg/dL (ref 1.7–2.4)

## 2021-10-18 LAB — BASIC METABOLIC PANEL
Anion gap: 8 (ref 5–15)
BUN: 16 mg/dL (ref 6–20)
CO2: 43 mmol/L — ABNORMAL HIGH (ref 22–32)
Calcium: 9.2 mg/dL (ref 8.9–10.3)
Chloride: 92 mmol/L — ABNORMAL LOW (ref 98–111)
Creatinine, Ser: 0.56 mg/dL (ref 0.44–1.00)
GFR, Estimated: >60 mL/min (ref >60)
Glucose, Bld: 85 mg/dL (ref 70–99)
Potassium: 3.5 mmol/L (ref 3.5–5.1)
Sodium: 143 mmol/L (ref 135–145)

## 2021-10-18 LAB — CBC
HCT: 38.4 % (ref 36.0–46.0)
Hemoglobin: 11.5 g/dL — ABNORMAL LOW (ref 12.0–15.0)
MCH: 29.8 pg (ref 26.0–34.0)
MCHC: 29.9 g/dL — ABNORMAL LOW (ref 30.0–36.0)
MCV: 99.5 fL (ref 80.0–100.0)
Platelets: 234 10*3/uL (ref 150–400)
RBC: 3.86 MIL/uL — ABNORMAL LOW (ref 3.87–5.11)
RDW: 15 % (ref 11.5–15.5)
WBC: 9.6 10*3/uL (ref 4.0–10.5)
nRBC: 0 % (ref 0.0–0.2)

## 2021-10-18 MED ORDER — QUETIAPINE FUMARATE 25 MG PO TABS
50.0000 mg | ORAL_TABLET | Freq: Every day | ORAL | Status: DC
  Administered 2021-10-18: 50 mg via ORAL
  Filled 2021-10-18: qty 2

## 2021-10-18 MED ORDER — POLYETHYLENE GLYCOL 3350 17 G PO PACK
17.0000 g | PACK | Freq: Two times a day (BID) | ORAL | Status: DC
  Administered 2021-10-18 – 2021-10-19 (×3): 17 g via ORAL
  Filled 2021-10-18 (×3): qty 1

## 2021-10-18 MED ORDER — ACETYLCYSTEINE 20 % IN SOLN
4.0000 mL | Freq: Two times a day (BID) | RESPIRATORY_TRACT | Status: DC
  Administered 2021-10-18 – 2021-10-19 (×3): 4 mL via RESPIRATORY_TRACT
  Filled 2021-10-18 (×4): qty 4

## 2021-10-18 MED ORDER — GABAPENTIN 400 MG PO CAPS
400.0000 mg | ORAL_CAPSULE | Freq: Three times a day (TID) | ORAL | Status: DC
  Administered 2021-10-18 – 2021-10-19 (×4): 400 mg via ORAL
  Filled 2021-10-18 (×4): qty 1

## 2021-10-18 NOTE — Assessment & Plan Note (Signed)
--  ABG x2 with pCO2 in 80's, however, pH normal.  Pt has hx of elevated pCO2 ranged between 34'V-425'Z, with metabolic compensation of bicarb in 40's. --will apply BiPAP for treatment if hypercapnia is accompanied by somnolence.

## 2021-10-18 NOTE — Progress Notes (Addendum)
PROGRESS NOTE    Misty Green  [id number]:[id number] DOB: 19-Dec-1970 DOA: 10/16/2021 PCP: [name], MD  250A/250A-AA  LOS: 2 days   Brief hospital course:   Assessment & Plan: Misty Green is a 51 y.o. female with medical history significant for chronic respiratory failure secondary to COPD on 3 L/min O2 at baseline and trilogy when sleeping, hypertension, diastolic CHF, peripheral neuropathy, chronic pain syndrome, history of cardiac arrest, OSA, remote tobacco abuse, history of diverticulitis status post partial resection with colostomy who presented to the ED with shortness of breath and wheezing unrelieved with home bronchodilator treatments.  On arrival of EMS O2 sat was 84% on 4 L.     * Acute on chronic respiratory failure with hypoxia (HCC) Secondary mainly to COPD exacerbation, with some element of CHF At baseline on 3 L O2 with Trelegy at nights O2 sat 84% on 4 L with EMS, requiring BiPAP.  Then transitioned to heated hf. --weaned down to 6L today --Treat COPD and CHF  --Continue supplemental O2 to keep sats between 88-92%, wean as tolerated  Hypercapnic respiratory failure, chronic (HCC) --ABG x2 with pCO2 in 80's, however, pH normal.  Pt has hx of elevated pCO2 ranged between 09'T-267'T, with metabolic compensation of bicarb in 40's. --will apply BiPAP for treatment if hypercapnia is accompanied by somnolence.  COPD exacerbation ([place]) --received IV solumedrol 80 mg x1 and transitioned to prednisone. Plan: --cont prednisone 40 mg daily, will need to discharge on a taper because pt has had many steroid courses recently. --Schedule and as needed nebulized --start mucomyst Neb BID today  Acute on chronic diastolic CHF (congestive heart failure) (HCC) --chest x-ray showing borderline cardiomegaly with vascular congestion and trace pleural effusions --cont IV lasix 40 mg daily  Hypokalemia --monitor and replete PRN  Abdominal pain --pain located near the  surface over left abdomen atove the stoma, likely 2/2 Parastomal hernia  --IV toradol PRN    Colostomy status s/p partial colectomy for recurrent diverticulitis ([place]) Colostomy care  Anxiety disorder due to multiple medical problems Continue Lexapro and Wellbutrin.  --used to take Benzo in the past, but no current Rx --Xanax PRN while inpatient to avoid anxiety leading to respiratory distress   Chronic pain syndrome --no current opioids Rx Plan: --cont home gabapentin --IV toradol PRN   DVT prophylaxis: Lovenox SQ Code Status: Full code  Family Communication: daughter updated on the phone today Level of care: Progressive Dispo:   The patient is from: home Anticipated d/c is to: home Anticipated d/c date is: 1-2 days.  Can be discharged when pt is weaned down to 3L O2.   Subjective and Interval History:  Today, pt complained of pain in her back.    O2 weaned down to 6L today.   Objective: Vitals:   10/18/21 0757 10/18/21 0911 10/18/21 1210 10/18/21 1348  BP: 118/69  109/68   Pulse: 85 87 89   Resp: 18 (!) 22 19   Temp: 97.7 F (36.5 C)  98.5 F (36.9 C)   TempSrc:   Oral   SpO2: 97% 97% 100% 100%  Weight:      Height:        Intake/Output Summary (Last 24 hours) at 10/18/2021 1550 Last data filed at 10/18/2021 0915 Gross per 24 hour  Intake 480 ml  Output 600 ml  Net -120 ml   Filed Weights   10/16/21 2000 10/18/21 0315  Weight: 76.3 kg 73.2 kg    Examination:   Constitutional: NAD,  AAOx3 HEENT: conjunctivae and lids normal, EOMI CV: No cyanosis.   RESP: normal respiratory effort, on 6L Neuro: II - XII grossly intact.   Psych: Normal mood and affect.     Data Reviewed: I have personally reviewed labs and imaging studies  Time spent: 35 minutes  Enzo Bi, MD [facility] If 7PM-7AM, please contact night-coverage 10/18/2021, 3:50 PM

## 2021-10-18 NOTE — Assessment & Plan Note (Signed)
Secondary mainly to COPD exacerbation, with some element of CHF At baseline on 3 L O2 with Trelegy at nights O2 sat 84% on 4 L with EMS, requiring BiPAP.  Then transitioned to heated hf. --weaned down to 6L today --Treat COPD and CHF  --Continue supplemental O2 to keep sats between 88-92%, wean as tolerated

## 2021-10-19 DIAGNOSIS — J9621 Acute and chronic respiratory failure with hypoxia: Secondary | ICD-10-CM | POA: Diagnosis not present

## 2021-10-19 LAB — BASIC METABOLIC PANEL
Anion gap: 9 (ref 5–15)
BUN: 20 mg/dL (ref 6–20)
CO2: 42 mmol/L — ABNORMAL HIGH (ref 22–32)
Calcium: 9.1 mg/dL (ref 8.9–10.3)
Chloride: 89 mmol/L — ABNORMAL LOW (ref 98–111)
Creatinine, Ser: 0.6 mg/dL (ref 0.44–1.00)
GFR, Estimated: >60 mL/min (ref >60)
Glucose, Bld: 81 mg/dL (ref 70–99)
Potassium: 4.1 mmol/L (ref 3.5–5.1)
Sodium: 140 mmol/L (ref 135–145)

## 2021-10-19 LAB — CBC
HCT: 36.1 % (ref 36.0–46.0)
Hemoglobin: 10.7 g/dL — ABNORMAL LOW (ref 12.0–15.0)
MCH: 28.9 pg (ref 26.0–34.0)
MCHC: 29.6 g/dL — ABNORMAL LOW (ref 30.0–36.0)
MCV: 97.6 fL (ref 80.0–100.0)
Platelets: 214 10*3/uL (ref 150–400)
RBC: 3.7 MIL/uL — ABNORMAL LOW (ref 3.87–5.11)
RDW: 15 % (ref 11.5–15.5)
WBC: 7.2 10*3/uL (ref 4.0–10.5)
nRBC: 0 % (ref 0.0–0.2)

## 2021-10-19 LAB — MAGNESIUM: Magnesium: 2 mg/dL (ref 1.7–2.4)

## 2021-10-19 MED ORDER — MOMETASONE FURO-FORMOTEROL FUM 200-5 MCG/ACT IN AERO
2.0000 | INHALATION_SPRAY | Freq: Two times a day (BID) | RESPIRATORY_TRACT | Status: DC
  Administered 2021-10-19: 2 via RESPIRATORY_TRACT
  Filled 2021-10-19: qty 8.8

## 2021-10-19 MED ORDER — PANTOPRAZOLE SODIUM 40 MG PO TBEC
40.0000 mg | DELAYED_RELEASE_TABLET | Freq: Two times a day (BID) | ORAL | Status: DC
  Administered 2021-10-19: 40 mg via ORAL
  Filled 2021-10-19: qty 1

## 2021-10-19 MED ORDER — PREDNISONE 20 MG PO TABS: 40.0000 mg | ORAL_TABLET | Freq: Every day | ORAL | 0 refills | Status: AC

## 2021-10-19 MED ORDER — ALBUTEROL SULFATE HFA 108 (90 BASE) MCG/ACT IN AERS: 2.0000 | INHALATION_SPRAY | Freq: Four times a day (QID) | RESPIRATORY_TRACT | 2 refills | Status: AC | PRN

## 2021-10-19 MED ORDER — QUETIAPINE FUMARATE 25 MG PO TABS: 50.0000 mg | ORAL_TABLET | Freq: Every day | ORAL | Status: DC

## 2021-10-19 MED ORDER — ONDANSETRON HCL 4 MG PO TABS: 4.0000 mg | ORAL_TABLET | Freq: Three times a day (TID) | ORAL | Status: DC | PRN

## 2021-10-19 MED ORDER — FUROSEMIDE 40 MG PO TABS
40.0000 mg | ORAL_TABLET | Freq: Every day | ORAL | Status: DC
  Administered 2021-10-19: 40 mg via ORAL
  Filled 2021-10-19: qty 1

## 2021-10-19 MED ORDER — MELATONIN 5 MG PO TABS: 30.0000 mg | ORAL_TABLET | Freq: Every day | ORAL | Status: DC

## 2021-10-19 MED ORDER — ADULT MULTIVITAMIN W/MINERALS CH
1.0000 | ORAL_TABLET | Freq: Every day | ORAL | Status: DC
  Administered 2021-10-19: 1 via ORAL
  Filled 2021-10-19: qty 1

## 2021-10-19 MED ORDER — POLYETHYLENE GLYCOL 3350 17 G PO PACK: 17.0000 g | PACK | Freq: Every day | ORAL | Status: DC

## 2021-10-19 NOTE — Progress Notes (Signed)
  Transition of Care [facility]) Screening Note   Patient Details  Name: Misty Green Date of Birth: September 07, 1970   Transition [facility]) CM/SW Contact:    Alberteen Sam, LCSW Phone Number: 10/19/2021, 12:21 PM  Patient to dc home with self care, on baseline home O2.  Transition of Care Department [facility]) has reviewed patient and no TOC needs have been identified at this time. We will continue to monitor patient advancement through interdisciplinary progression rounds. If new patient transition needs arise, please place a TOC consult.  [place], [place]

## 2021-10-19 NOTE — Progress Notes (Signed)
Discharge instructions provided to patient. All medications, follow up appointments, and discharge instructions provided. IV out. Discharging home with family.

## 2021-10-19 NOTE — Discharge Summary (Signed)
Physician Discharge Summary   Patient: Misty Green MRN: [id number] DOB: February 23, 1970  Admit date:     10/16/2021  Discharge date: 10/19/21  Discharge Physician: Fritzi Mandes   PCP: [name], MD   Recommendations at discharge:    F/u Dr Lanney Gins in 1-2 weeks  Discharge Diagnoses: Principal Problem:   Acute on chronic respiratory failure with hypoxia [facility]) Active Problems:   Hypercapnic respiratory failure, chronic (HCC)   COPD exacerbation (HCC)   Acute on chronic diastolic CHF (congestive heart failure) (HCC)   Hypokalemia   Chronic pain syndrome   Anxiety disorder due to multiple medical problems   Colostomy status s/p partial colectomy for recurrent diverticulitis ([place])   Abdominal pain  Hospital Course: Misty Green is a 51 y.o. female with medical history significant for chronic respiratory failure secondary to COPD on 3 L/min O2 at baseline and trilogy when sleeping, hypertension, diastolic CHF, peripheral neuropathy, chronic pain syndrome, history of cardiac arrest, OSA, remote tobacco abuse, history of diverticulitis status post partial resection with colostomy who presented to the ED with shortness of breath and wheezing unrelieved with home bronchodilator treatments.  On arrival of EMS O2 sat was 84% on 4 L.     Acute on chronic respiratory failure with hypoxia (HCC) Secondary mainly to COPD exacerbation, with some element of CHF At baseline on 3 L O2 with Trilogy at nights O2 sat 84% on 4 L with EMS, requiring BiPAP.  Then transitioned to heated hf. --will wean down to 3 liters today. Slept good. Talking sentences w/o sob or wheezing --Treat COPD and CHF  --Continue supplemental O2 to keep sats between 86-92%, wean as tolerated   Hypercapnic respiratory failure, chronic (HCC) --ABG x2 with pCO2 in 80's, however, pH normal.  Pt has hx of elevated pCO2 ranged between 97'L-892'J, with metabolic compensation of bicarb in 40's. --mentation is normal.    COPD exacerbation (HCC) --received IV solumedrol 80 mg x1 and transitioned to prednisone. --cont prednisone 40 mg daily for 3-4 more days --Schedule and as needed nebulized   Acute on chronic diastolic CHF (congestive heart failure) (HCC) --chest x-ray showing borderline cardiomegaly with vascular congestion and trace pleural effusions -- IV lasix 40 mg daily--change to po at d/c   Hypokalemia --monitor and replete PRN   Abdominal pain --pain located near the surface over left abdomen atove the stoma, likely 2/2 Parastomal hernia  --IV toradol PRN   Colostomy status s/p partial colectomy for recurrent diverticulitis ([place]) Colostomy care   Anxiety disorder due to multiple medical problems Continue Lexapro and Wellbutrin.  --used to take Benzo in the past, but no current Rx --Xanax PRN while inpatient to avoid anxiety leading to respiratory distress   Chronic pain syndrome --no current opioids Rx --cont home gabapentin --IV toradol PRN    Overall improving and wean to 3L (Chronically)/ d/c home today. Pt agreeable   DVT prophylaxis: Lovenox SQ Code Status: Full code  Family Communication: none today Level of care: Progressive      Disposition: home Diet recommendation:  Discharge Diet Orders (From admission, onward)     Start     Ordered   10/19/21 0000  Diet - low sodium heart healthy        10/19/21 1025           Cardiac diet DISCHARGE MEDICATION: Allergies as of 10/19/2021       Reactions   Other Nausea And Vomiting   Anti-depressent that starts with t  Medication List     STOP taking these medications    buPROPion 75 MG tablet Commonly known as: WELLBUTRIN   Daliresp 500 MCG Tabs tablet Generic drug: roflumilast   oxyCODONE 5 MG immediate release tablet Commonly known as: Oxy IR/ROXICODONE       TAKE these medications    acetaminophen 325 MG tablet Commonly known as: TYLENOL Take 1-2 tablets (325-650 mg total) by mouth  every 4 (four) hours as needed for mild pain. What changed:  how much to take when to take this   albuterol (2.5 MG/3ML) 0.083% nebulizer solution Commonly known as: PROVENTIL Take 2.5 mg by nebulization every 6 (six) hours as needed for wheezing or shortness of breath. Inhale one vial via nebulizer 4 times a day as directed.   albuterol 108 (90 Base) MCG/ACT inhaler Commonly known as: VENTOLIN HFA Inhale 2 puffs into the lungs every 6 (six) hours as needed for wheezing or shortness of breath.   ALPRAZolam 0.25 MG tablet Commonly known as: XANAX Take 1 tablet (0.25 mg total) by mouth 3 (three) times daily as needed for anxiety or sleep.   escitalopram 20 MG tablet Commonly known as: LEXAPRO One '20mg'$  tablet po daily   fluticasone 50 MCG/ACT nasal spray Commonly known as: FLONASE Place 2 sprays into both nostrils 2 (two) times daily as needed.   furosemide 40 MG tablet Commonly known as: LASIX Take 40 mg by mouth daily.   gabapentin 400 MG capsule Commonly known as: NEURONTIN Take 1 capsule (400 mg total) by mouth 3 (three) times daily.   Melatonin 10 MG Tabs Take 30 mg by mouth at bedtime.   multivitamin with minerals tablet Take 1 tablet by mouth at bedtime.   ondansetron 4 MG tablet Commonly known as: ZOFRAN Take 4 mg by mouth every 8 (eight) hours as needed for nausea or vomiting.   pantoprazole 40 MG tablet Commonly known as: PROTONIX Take 40 mg by mouth 2 (two) times daily.   polyethylene glycol 17 g packet Commonly known as: MIRALAX / GLYCOLAX Take 17 g by mouth daily.   potassium chloride SA 20 MEQ tablet Commonly known as: KLOR-CON M Take 1 tablet (20 mEq total) by mouth daily.   predniSONE 20 MG tablet Commonly known as: DELTASONE Take 2 tablets (40 mg total) by mouth daily with breakfast for 3 days. Start taking on: October 20, 2021   PRESCRIPTION MEDICATION Inhale into the lungs at bedtime. BiPap   pseudoephedrine 30 MG tablet Commonly known  as: SUDAFED Take 30 mg by mouth every 4 (four) hours as needed for congestion.   QUEtiapine 50 MG tablet Commonly known as: SEROQUEL Take 50 mg by mouth at bedtime.   Semaglutide (1 MG/DOSE) 4 MG/3ML Sopn Inject 1 mg into the skin once a week.   Wixela Inhub 500-50 MCG/ACT Aepb Generic drug: fluticasone-salmeterol Inhale 1 puff into the lungs every 12 (twelve) hours.        Follow-up Information     [name], MD. Schedule an appointment as soon as possible for a visit in 2 week(s).   Specialty: Internal Medicine Contact information: [place] [address] [phone]         Ottie Glazier, MD. Schedule an appointment as soon as possible for a visit in 1 week(s).   Specialty: Pulmonary Disease Why: COPD flare Contact information: [place] [address] [phone]                Discharge  Exam: Filed Weights   10/16/21 2000 10/18/21 0315 10/19/21 0254  Weight: 76.3 kg 73.2 kg 75.6 kg     Condition at discharge: fair  The results of significant diagnostics from this hospitalization (including imaging, microbiology, ancillary and laboratory) are listed below for reference.   Imaging Studies: ECHOCARDIOGRAM COMPLETE  Result Date: 10/17/2021    ECHOCARDIOGRAM REPORT   Patient Name:   Misty Green Date of Exam: 10/17/2021 Medical Rec #:  [id number]         Height:       64.0 in Accession #:    [id number]        Weight:       168.2 lb Date of Birth:  Jun 16, 1970         BSA:          1.818 m Patient Age:    65 years          BP:           98/58 mmHg Patient Gender: F                 HR:           83 bpm. Exam Location:  [facility] Procedure: 2D Echo, Cardiac Doppler and Color Doppler Indications:     CHF  History:         Patient has prior history of Echocardiogram examinations, most                  recent 05/18/2018. CHF, Previous Myocardial Infarction, COPD,                  Signs/Symptoms:Shortness of Breath  and Edema; Risk                  Factors:Hypertension and Current Smoker.  Sonographer:     Wenda Low Referring Phys:  [id number] Athena Masse Diagnosing Phys: Neoma Laming  Sonographer Comments: Image acquisition challenging due to uncooperative patient and Image acquisition challenging due to respiratory motion. Patient is sitting/leaning forward for most of the exam and is continually moving. IMPRESSIONS  1. Left ventricular ejection fraction, by estimation, is 55 to 60%. The left ventricle has normal function. The left ventricle has no regional wall motion abnormalities. There is moderate concentric left ventricular hypertrophy. Left ventricular diastolic parameters are consistent with Grade I diastolic dysfunction (impaired relaxation).  2. Right ventricular systolic function is normal. The right ventricular size is normal. There is normal pulmonary artery systolic pressure.  3. Left atrial size was mild to moderately dilated.  4. Right atrial size was mildly dilated.  5. The mitral valve is normal in structure. Mild mitral valve regurgitation. No evidence of mitral stenosis.  6. The aortic valve is calcified. Aortic valve regurgitation is mild. Severe aortic valve stenosis.  7. The inferior vena cava is normal in size with greater than 50% respiratory variability, suggesting right atrial pressure of 3 mmHg. FINDINGS  Left Ventricle: Left ventricular ejection fraction, by estimation, is 55 to 60%. The left ventricle has normal function. The left ventricle has no regional wall motion abnormalities. The left ventricular internal cavity size was normal in size. There is  moderate concentric left ventricular hypertrophy. Left ventricular diastolic parameters are consistent with Grade I diastolic dysfunction (impaired relaxation). Right Ventricle: The right ventricular size is normal. No increase in right ventricular wall thickness. Right ventricular systolic function is normal. There is normal pulmonary  artery systolic pressure. The tricuspid regurgitant velocity is 2.23 m/s,  and  with an assumed right atrial pressure of 8 mmHg, the estimated right ventricular systolic pressure is 43.3 mmHg. Left Atrium: Left atrial size was mild to moderately dilated. Right Atrium: Right atrial size was mildly dilated. Pericardium: There is no evidence of pericardial effusion. Mitral Valve: The mitral valve is normal in structure. Mild mitral valve regurgitation. No evidence of mitral valve stenosis. MV peak gradient, 8.8 mmHg. The mean mitral valve gradient is 4.0 mmHg. Tricuspid Valve: The tricuspid valve is normal in structure. Tricuspid valve regurgitation is not demonstrated. No evidence of tricuspid stenosis. Aortic Valve: The aortic valve is calcified. Aortic valve regurgitation is mild. Severe aortic stenosis is present. Aortic valve mean gradient measures 24.0 mmHg. Aortic valve peak gradient measures 48.3 mmHg. Aortic valve area, by VTI measures 1.19 cm. Pulmonic Valve: The pulmonic valve was normal in structure. Pulmonic valve regurgitation is not visualized. No evidence of pulmonic stenosis. Aorta: The aortic root is normal in size and structure. Venous: The inferior vena cava is normal in size with greater than 50% respiratory variability, suggesting right atrial pressure of 3 mmHg. IAS/Shunts: No atrial level shunt detected by color flow Doppler.  LEFT VENTRICLE PLAX 2D LVIDd:         3.90 cm   Diastology LVIDs:         2.80 cm   LV e' medial:    11.30 cm/s LV PW:         1.00 cm   LV E/e' medial:  8.2 LV IVS:        0.90 cm   LV e' lateral:   10.00 cm/s LVOT diam:     2.00 cm   LV E/e' lateral: 9.3 LV SV:         83 LV SV Index:   46 LVOT Area:     3.14 cm  LEFT ATRIUM           Index LA diam:      3.30 cm 1.82 cm/m LA Vol (A4C): 31.7 ml 17.44 ml/m  AORTIC VALVE AV Area (Vmax):    1.02 cm AV Area (Vmean):   1.16 cm AV Area (VTI):     1.19 cm AV Vmax:           347.67 cm/s AV Vmean:          214.000 cm/s AV  VTI:            0.697 m AV Peak Grad:      48.3 mmHg AV Mean Grad:      24.0 mmHg LVOT Vmax:         113.00 cm/s LVOT Vmean:        79.200 cm/s LVOT VTI:          0.265 m LVOT/AV VTI ratio: 0.38  AORTA Ao Root diam: 3.20 cm MITRAL VALVE                TRICUSPID VALVE MV Area (PHT): 3.07 cm     TR Peak grad:   19.9 mmHg MV Area VTI:   2.30 cm     TR Vmax:        223.00 cm/s MV Peak grad:  8.8 mmHg MV Mean grad:  4.0 mmHg     SHUNTS MV Vmax:       1.48 m/s     Systemic VTI:  0.26 m MV Vmean:      88.2 cm/s    Systemic Diam: 2.00 cm MV Decel Time: 247 msec  MV E velocity: 92.80 cm/s MV A velocity: 123.00 cm/s MV E/A ratio:  0.75 Shaukat Khan Electronically signed by Neoma Laming Signature Date/Time: 10/17/2021/1:21:17 PM    Final    DG Chest Portable 1 View  Result Date: 10/16/2021 CLINICAL DATA:  Shortness of breath EXAM: PORTABLE CHEST 1 VIEW COMPARISON:  06/20/2021, CT 06/21/2021, chest x-ray 10/05/2020 FINDINGS: Borderline to mild cardiomegaly with trace pleural effusion or thickening. Central vascular congestion. Diffuse increased interstitial opacity which could be due to mild edema or inflammation. Calcified bilateral lung nodules consistent with prior granulomatous disease. IMPRESSION: 1. Borderline cardiomegaly with vascular congestion and mild diffuse interstitial process either due to minimal edema or inflammation. Trace pleural effusions versus pleural thickening. Electronically Signed   By: Donavan Foil M.D.   On: 10/16/2021 16:19    Microbiology: Results for orders placed or performed during the hospital encounter of 10/16/21  SARS Coronavirus 2 by RT PCR (hospital order, performed in [facility] hospital lab) *cepheid single result test* Anterior Nasal Swab     Status: None   Collection Time: 10/16/21  3:44 PM   Specimen: Anterior Nasal Swab  Result Value Ref Range Status   SARS Coronavirus 2 by RT PCR NEGATIVE NEGATIVE Final    Comment: (NOTE) SARS-CoV-2 target nucleic acids are NOT  DETECTED.  The SARS-CoV-2 RNA is generally detectable in upper and lower respiratory specimens during the acute phase of infection. The lowest concentration of SARS-CoV-2 viral copies this assay can detect is 250 copies / mL. A negative result does not preclude SARS-CoV-2 infection and should not be used as the sole basis for treatment or other patient management decisions.  A negative result may occur with improper specimen collection / handling, submission of specimen other than nasopharyngeal swab, presence of viral mutation(s) within the areas targeted by this assay, and inadequate number of viral copies (<250 copies / mL). A negative result must be combined with clinical observations, patient history, and epidemiological information.  Fact Sheet for Patients:   https://www..info/  Fact Sheet for Healthcare Providers: https://hall.com/  This test is not yet approved or  cleared by the [place] FDA and has been authorized for detection and/or diagnosis of SARS-CoV-2 by FDA under an Emergency Use Authorization (EUA).  This EUA will remain in effect (meaning this test can be used) for the duration of the COVID-19 declaration under Section 564(b)(1) of the Act, 21 U.S.C. section 360bbb-3(b)(1), unless the authorization is terminated or revoked sooner.  Performed at [facility], [address] [place], [place] [address]     Labs: CBC: Recent Labs  Lab 10/16/21 1505 10/18/21 0508 10/19/21 0527  WBC 9.2 9.6 7.2  NEUTROABS 5.8  --   --   HGB 11.8* 11.5* 10.7*  HCT 41.2 38.4 36.1  MCV 103.0* 99.5 97.6  PLT 238 234 093   Basic Metabolic Panel: Recent Labs  Lab 10/16/21 1544 10/17/21 0538 10/18/21 0508 10/19/21 0527  NA 140 142 143 140  K 2.9* 3.4* 3.5 4.1  CL 92* 90* 92* 89*  CO2 39* 44* 43* 42*  GLUCOSE 117* 118* 85 81  BUN '7 10 16 20  '$ CREATININE 0.44 0.49 0.56 0.60  CALCIUM 8.7* 9.1 9.2 9.1  MG  1.7  --  1.9 2.0   Liver Function Tests: No results for input(s): "AST", "ALT", "ALKPHOS", "BILITOT", "PROT", "ALBUMIN" in the last 168 hours. CBG: No results for input(s): "GLUCAP" in the last 168 hours.  Discharge time spent: greater than 30 minutes.  Signed: Fritzi Mandes, MD [facility] 10/19/2021

## 2021-10-19 NOTE — Consult Note (Signed)
   Heart Failure Nurse Navigator Note  HFpEF 55 to 60%.  Moderate left ventricular hypertrophy.  Left ventricular diastolic parameters consistent with grade 1 diastolic dysfunction.  Mild mitral regurgitation.  Presented to the emergency room with complaints of worsening shortness of breath and PND.  Chest x-ray concerning for mild interstitial edema.  Comorbidities:  COPD with home O2 at 3 L GERD Hypertension Sleep apnea History of cardiac arrest Colostomy  Medications:  Furosemide 40 mg p.o. daily Potassium chloride 20 mEq daily  Labs:  Sodium 140, potassium 4.1, chloride 89, CO2 42, BUN 20, creatinine 0.6 estimated GFR greater than 60, magnesium 2, hemoglobin 10.7 hematocrit 36.1 Weight is 75.6 kg Blood pressure 116/84 Intake 240 mL Output not documented   Initial meeting with patient who was sitting up in the bed in no acute distress.  She states that she is being discharged home today.  There were no family members at the bedside.  Discussed what heart failure means, she voices understanding.  States at home that she does weigh herself daily.  Discussed parameters to report as far as weight gains or changes in symptoms such as PND, orthopnea, lower extremity edema, increasing abdominal girth etc. she voices understanding.   She states that they do eat food from restaurants quite often but she states at home that she really only eats 1 meal a day due to complaints of early satiety.  They do not add salt to their food at the table.  Also stressed the importance of sticking with fluid restriction of no more than 64 ounces in a 24-hour period.  Made aware that she has an appointment in the outpatient heart failure clinic on October 10 at 3:30 in the afternoon.  She has a 33% no-show ratio which is 32 out of 96 appointments.  She states in the past she had been given an appointment to the clinic but did not show.  She states at this time she feels the importance she will be  coming to this appointment.  She was given the living with heart failure teaching booklet, zone magnet, info on low-sodium and heart failure along with a weight chart.  She had no further questions.  Pricilla Riffle RN [facility]

## 2021-10-19 NOTE — Discharge Instructions (Signed)
Use your oxygen, inhalers and NIV as before

## 2021-10-27 ENCOUNTER — Ambulatory Visit: Payer: Commercial Managed Care - HMO | Admitting: Family

## 2021-10-28 ENCOUNTER — Encounter: Payer: Self-pay | Admitting: Family

## 2021-10-28 ENCOUNTER — Ambulatory Visit: Payer: Commercial Managed Care - HMO | Attending: Family | Admitting: Family

## 2021-10-28 VITALS — BP 116/82 | HR 99 | Resp 18 | Ht 60.0 in | Wt 152.0 lb

## 2021-10-28 DIAGNOSIS — I350 Nonrheumatic aortic (valve) stenosis: Secondary | ICD-10-CM | POA: Insufficient documentation

## 2021-10-28 DIAGNOSIS — R0602 Shortness of breath: Secondary | ICD-10-CM | POA: Insufficient documentation

## 2021-10-28 DIAGNOSIS — K219 Gastro-esophageal reflux disease without esophagitis: Secondary | ICD-10-CM | POA: Diagnosis not present

## 2021-10-28 DIAGNOSIS — G894 Chronic pain syndrome: Secondary | ICD-10-CM | POA: Insufficient documentation

## 2021-10-28 DIAGNOSIS — Z79899 Other long term (current) drug therapy: Secondary | ICD-10-CM | POA: Insufficient documentation

## 2021-10-28 DIAGNOSIS — F419 Anxiety disorder, unspecified: Secondary | ICD-10-CM | POA: Insufficient documentation

## 2021-10-28 DIAGNOSIS — I5032 Chronic diastolic (congestive) heart failure: Secondary | ICD-10-CM | POA: Diagnosis not present

## 2021-10-28 DIAGNOSIS — F068 Other specified mental disorders due to known physiological condition: Secondary | ICD-10-CM | POA: Diagnosis not present

## 2021-10-28 DIAGNOSIS — J449 Chronic obstructive pulmonary disease, unspecified: Secondary | ICD-10-CM | POA: Diagnosis not present

## 2021-10-28 DIAGNOSIS — J441 Chronic obstructive pulmonary disease with (acute) exacerbation: Secondary | ICD-10-CM | POA: Insufficient documentation

## 2021-10-28 DIAGNOSIS — I10 Essential (primary) hypertension: Secondary | ICD-10-CM

## 2021-10-28 DIAGNOSIS — I110 Hypertensive heart disease with heart failure: Secondary | ICD-10-CM | POA: Insufficient documentation

## 2021-10-28 DIAGNOSIS — Z9981 Dependence on supplemental oxygen: Secondary | ICD-10-CM | POA: Diagnosis not present

## 2021-10-28 DIAGNOSIS — Z87891 Personal history of nicotine dependence: Secondary | ICD-10-CM | POA: Insufficient documentation

## 2021-10-28 DIAGNOSIS — Z8674 Personal history of sudden cardiac arrest: Secondary | ICD-10-CM | POA: Insufficient documentation

## 2021-10-28 MED ORDER — DOXYCYCLINE HYCLATE 100 MG PO CAPS: 100.0000 mg | ORAL_CAPSULE | Freq: Two times a day (BID) | ORAL | 0 refills | Status: DC

## 2021-10-28 NOTE — Patient Instructions (Addendum)
Continue weighing daily and call for an overnight weight gain of 3 pounds or more or a weekly weight gain of more than 5 pounds.   If you have voicemail, please make sure your mailbox is cleaned out so that we may leave a message and please make sure to listen to any voicemails.    [facility] [place] 10/19 at 2:00pm Dr. Clayborn Bigness [phone]    Take 2 fluid pills every morning.    Begin antibiotic as 1 tablet every morning and 1 tablet every evening

## 2021-10-28 NOTE — Progress Notes (Signed)
Patient ID: Misty Green, female    DOB: [birth year], 51 y.o.   MRN: [id number]  HPI  Ms Adelstein is a 51 y/o female with a history of HTN, COPD, GERD, cardiac arrest, chronic pain syndrome, sleep apnea, tobacco use and chronic heart failure.   Echo report from 10/17/21 reviewed and showed an EF of 55-60% along with moderate LVH, mild/moderate LAE, mild RAE and mild MR/AR and severe AS  Admitted 10/16/21 due to shortness of breath due to COPD exacerbation and minimal HF.Initially needed bipap with heated high flow but then weaned down to baseline O2 @ 3L.IV solu-medrol given and transitioned to oral prednisone. IV lasix given and then changed to oral diuretics. Discharged after 3 days.   She presents today for her initial visit with a chief complaint of moderate shortness of breath with very little exertion. Describes this as chronic in nature having been present for several years although fluctuates in severity. 2 days ago, she was doing well but now feels very SOB. She has associated decreased appetite, productive cough (green sputum), dizziness, headaches, chronic back pain, difficulty sleeping and worsening anxiety along with this. She denies any abdominal distention, palpitations, pedal edema or chest pain.  Currently living with her son and he didn't realize his mom was supposed to be weighing but he does have scales at his home.   She is currently out of her xanax and reports worsening anxiety which she understands makes her SOB worse. Uses marijuana to ease her anxiety but doesn't take her oxygen off. Recently cancelled her PCP appointment which her daughter says happens frequently  Recently finished a zpack and prednisone. Is supposed to be taking '40mg'$  lasix daily but she's only been taking '20mg'$  daily.   Having issues with sleep apnea mask fitting correctly and someone is supposed to come to the home today to look at the mask and the machine. Previously she was "messing with the  machine" during a time when she was experiencing AMS.   Past Medical History:  Diagnosis Date   Cardiac arrest ([place]) 05/12/2018   CHF (congestive heart failure) (HCC)    Chicken pox    COPD (chronic obstructive pulmonary disease) (HCC)    Current smoker    Dyspnea    GERD (gastroesophageal reflux disease)    Hypertension    Malfunction of colostomy and enterostomy (HCC)    Opiate abuse, continuous ([place])    Oxygen deficit    Sleep apnea    Past Surgical History:  Procedure Laterality Date   ABDOMINAL HYSTERECTOMY     BACK SURGERY     cesction     COLONOSCOPY WITH PROPOFOL N/A 06/24/2021   Procedure: COLONOSCOPY WITH PROPOFOL;  Surgeon: [name], DO;  Location: [facility] ENDOSCOPY;  Service: Gastroenterology;  Laterality: N/A;  through stoma   COLOSTOMY  01/13/2018   Procedure: COLOSTOMY Creation;  Surgeon: Jules Husbands, MD;  Location: [facility] ORS;  Service: General;;   ESOPHAGOGASTRODUODENOSCOPY (EGD) WITH PROPOFOL N/A 06/23/2021   Procedure: ESOPHAGOGASTRODUODENOSCOPY (EGD) WITH PROPOFOL;  Surgeon: [name], DO;  Location: [facility];  Service: Gastroenterology;  Laterality: N/A;   HERNIA REPAIR     INCISIONAL HERNIA REPAIR     lower midline laparotomy incision (hysterectomy), repaired with mesh   IR FLUORO GUIDE CV LINE RIGHT  05/26/2018   IR REMOVAL TUN CV CATH W/O FL  06/02/2018   IR US GUIDE VASC ACCESS RIGHT  05/26/2018   LAPAROSCOPIC CHOLECYSTECTOMY  2012   LAPAROSCOPIC LYSIS  OF ADHESIONS  01/13/2018   Procedure: LAPAROSCOPIC LYSIS OF ADHESIONS;  Surgeon: Jules Husbands, MD;  Location: [facility] ORS;  Service: General;;   LAPAROSCOPIC SIGMOID COLECTOMY  01/13/2018   Procedure: LAPAROSCOPIC SIGMOID COLECTOMY;  Surgeon: Jules Husbands, MD;  Location: [facility] ORS;  Service: General;;   LUMBAR SPINE SURGERY     ORIF WRIST FRACTURE Right 08/25/2015   Procedure: OPEN REDUCTION INTERNAL FIXATION (ORIF) WRIST FRACTURE;  Surgeon: Corky Mull, MD;  Location: [facility]  ORS;  Service: Orthopedics;  Laterality: Right;   OVARIAN CYST REMOVAL     Family History  Problem Relation Age of Onset   Diabetes Mother    COPD Mother    Hypertension Father    Diabetes Father    Heart disease Father    Hyperlipidemia Father    Prostate cancer Father    Social History   Tobacco Use   Smoking status: Former    Packs/day: 1.00    Years: 15.00    Total pack years: 15.00    Types: Cigarettes   Smokeless tobacco: Never   Tobacco comments:    4 cigarettes a day   Substance Use Topics   Alcohol use: Yes    Comment: rarely   Allergies  Allergen Reactions   Ativan [Lorazepam] Other (See Comments)    D/w with dter Aniceto Boss pt gets very combative and agitated. Has tolerated Versed well   Other Nausea And Vomiting    Anti-depressent that starts with t   Prior to Admission medications   Medication Sig Start Date End Date Taking? Authorizing Provider  acetaminophen (TYLENOL) 325 MG tablet Take 1-2 tablets (325-650 mg total) by mouth every 4 (four) hours as needed for mild pain. Patient taking differently: Take 650 mg by mouth 3 (three) times daily. 06/05/18  Yes Love, Ivan Anchors, PA-C  albuterol (PROVENTIL) (2.5 MG/3ML) 0.083% nebulizer solution Take 2.5 mg by nebulization every 6 (six) hours as needed for wheezing or shortness of breath. Inhale one vial via nebulizer 4 times a day as directed.   Yes [provider]  albuterol (VENTOLIN HFA) 108 (90 Base) MCG/ACT inhaler Inhale 2 puffs into the lungs every 6 (six) hours as needed for wheezing or shortness of breath. 10/19/21  Yes Fritzi Mandes, MD  ALPRAZolam Duanne Moron) 0.25 MG tablet Take 1 tablet (0.25 mg total) by mouth 3 (three) times daily as needed for anxiety or sleep. 06/24/21  Yes [name], MD  escitalopram (LEXAPRO) 20 MG tablet One '20mg'$  tablet po daily 06/24/21  Yes Wieting, Richard, MD  fluticasone [facility]) 50 MCG/ACT nasal spray Place 2 sprays into both nostrils 2 (two) times daily as needed. 07/02/21   Yes [provider]  furosemide (LASIX) 40 MG tablet Take 40 mg by mouth daily. 01/20/20  Yes [provider]  gabapentin (NEURONTIN) 400 MG capsule Take 1 capsule (400 mg total) by mouth 3 (three) times daily. 07/12/19  Yes Mercy Riding, MD  Melatonin 10 MG TABS Take 30 mg by mouth at bedtime.   Yes [provider]  Multiple Vitamins-Minerals (MULTIVITAMIN WITH MINERALS) tablet Take 1 tablet by mouth at bedtime.   Yes [provider]  ondansetron (ZOFRAN) 4 MG tablet Take 4 mg by mouth every 8 (eight) hours as needed for nausea or vomiting.   Yes [provider]  pantoprazole (PROTONIX) 40 MG tablet Take 40 mg by mouth 2 (two) times daily. 11/11/19  Yes [provider]  polyethylene glycol (MIRALAX / GLYCOLAX) 17 g packet  Take 17 g by mouth daily. 02/12/20  Yes Eugenie Filler, MD  potassium chloride (KLOR-CON M) 20 MEQ tablet Take 1 tablet (20 mEq total) by mouth daily. 06/24/21  Yes [name], MD  PRESCRIPTION MEDICATION Inhale into the lungs at bedtime. BiPap   Yes [provider]  pseudoephedrine (SUDAFED) 30 MG tablet Take 30 mg by mouth every 4 (four) hours as needed for congestion.   Yes [provider]  QUEtiapine (SEROQUEL) 50 MG tablet Take 50 mg by mouth at bedtime. 09/23/21  Yes [provider]  Semaglutide, 1 MG/DOSE, 4 MG/3ML SOPN Inject 1 mg into the skin once a week. 09/28/21  Yes [provider]  Grant Ruts INHUB 500-50 MCG/ACT AEPB Inhale 1 puff into the lungs every 12 (twelve) hours. 09/28/21  Yes [provider]  doxycycline (VIBRAMYCIN) 100 MG capsule Take 1 capsule (100 mg total) by mouth 2 (two) times daily. 10/28/21   Alisa Graff, FNP   Review of Systems  Constitutional:  Positive for appetite change (decreased) and fatigue (with little exertion).  HENT:  Negative for congestion, postnasal drip and sore throat.   Eyes: Negative.   Respiratory:  Positive for cough (thick  green mucous) and shortness of breath.   Cardiovascular:  Negative for chest pain, palpitations and leg swelling.  Gastrointestinal:  Negative for abdominal distention and abdominal pain.  Endocrine: Negative.   Genitourinary: Negative.   Musculoskeletal:  Positive for back pain (chronic). Negative for neck pain.  Skin: Negative.   Allergic/Immunologic: Negative.   Neurological:  Positive for dizziness, light-headedness and headaches.  Hematological:  Negative for adenopathy. Does not bruise/bleed easily.  Psychiatric/Behavioral:  Positive for sleep disturbance (not sleeping due to issues with sleep mask and anxiety). Negative for dysphoric mood. The patient is nervous/anxious.     Vitals:   10/28/21 1509  BP: 116/82  Pulse: 99  Resp: 18  SpO2: 91%  Weight: 152 lb (68.9 kg)  Height: 5' (1.524 m)   Wt Readings from Last 3 Encounters:  10/28/21 152 lb (68.9 kg)  10/19/21 166 lb 10.7 oz (75.6 kg)  06/21/21 179 lb 14.3 oz (81.6 kg)   Lab Results  Component Value Date   CREATININE 0.60 10/19/2021   CREATININE 0.56 10/18/2021   CREATININE 0.49 10/17/2021   Physical Exam Vitals and nursing note reviewed. Exam conducted with a chaperone present (son & daughter).  Constitutional:      Appearance: She is ill-appearing.  HENT:     Head: Normocephalic and atraumatic.  Cardiovascular:     Rate and Rhythm: Normal rate and regular rhythm.  Pulmonary:     Effort: Pulmonary effort is normal. No respiratory distress.     Breath sounds: No wheezing.     Comments: Diminished in bilateral bases Abdominal:     General: There is no distension.     Palpations: Abdomen is soft.     Tenderness: There is no abdominal tenderness.  Musculoskeletal:        General: No tenderness.     Cervical back: Normal range of motion and neck supple.     Right lower leg: No edema.     Left lower leg: No edema.  Skin:    General: Skin is warm and dry.  Neurological:     General: No focal deficit  present.     Mental Status: She is alert and oriented to person, place, and time.  Psychiatric:        Mood and Affect: Mood is  anxious.        Behavior: Behavior normal.    Assessment & Plan:  1: Chronic heart failure with preserved ejection fraction with structural changes (LVH)- - NYHA class III - euvolemic today - not weighing but does have scales; instructed to weigh daily and call for an overnight weight gain of > 2 pounds or a weekly weight gain of >5 pounds - not adding salt and is trying to monitor sodium intake in foods they eat - drinking 72-96 ounces of diet mtn dew daily and no water; reviewed the importance of keeping daily fluid intake to 60-64 ounces and be drinking some water in place of the mtn dew - had been taking only '20mg'$  furosemide daily; explained that she needed to take '40mg'$  daily - BNP 10/16/21 was 79.9  2: HTN- - BP looks good (116/82) - saw PCP Tressia Miners) 09/28/21; f/u needs to be r/s - BMP 10/19/21 reviewed and showed sodium 140, potassium 4.1, creatiine 0.6 and GFR >60  3: COPD- - using trilogy but it's not working correctly because during a time she was experiencing AMS, she was "messing with the machine"; someone is coming by this afternoon to look at machine and settings - saw pulmonology Lanney Gins) 07/02/21; returns 11/13/21 - wearing oxygen at 3L around the clock - will add doxycycline '100mg'$  BID  4: Anxiety- - using alprazolam TID PRN although currently doesn't have any - explained that she needed to contact PCP regarding this - taking seroquel QHS and lexapro once daily - using marijuana for her anxiety; emphasized that if she chooses to smoke this, she must turn her oxygen off and remove herself from any oxygen source due to fire risk  5: Aortic stenosis- - has not seen cardiology before - explained that with her severe COPD, she is probably not a surgical candidate - they are all in agreement with getting established with cardiology so an  appointment was scheduled at [facility]) on 11/05/21   Patient did not show for his Heart Failure Clinic appointment on 03/25/15. Will attempt to reschedule.    Return in 1 month, sooner if needed. Daughter says that she will call back to r/s this.

## 2021-11-16 ENCOUNTER — Ambulatory Visit: Payer: Commercial Managed Care - HMO | Admitting: Internal Medicine

## 2021-11-22 NOTE — Progress Notes (Incomplete)
ADVANCED HF CLINIC CONSULT NOTE  Referring Physician: Darylene Price, NP Primary Care: [name], MD Primary Cardiologist: None   HPI:  Ms Bear is a 51 y/o female with a history of HTN, COPD, GERD, cardiac arrest, chronic pain syndrome, sleep apnea, tobacco use,severe AS and chronic diastolic heart failure referred by Ms. Hackney for further evaluation of her HF.   Admitted 10/16/21 due to shortness of breath due to COPD exacerbation and minimal HF.Initially needed bipap with heated high flow but then weaned down to baseline O2 @ 3L.IV solu-medrol given and transitioned to oral prednisone. IV lasix given and then changed to oral diuretics. Discharged after 3 days.    Echo 10/17/21 reviewed and showed an EF of 55-60% along with moderate LVH, mild/moderate LAE, mild RAE and mild MR/AR and severe AS. Referred to HF clinic and saw Ms. Hackney.    Currently living with her son and he didn't realize his mom was supposed to be weighing but he does have scales at his home.    She is currently out of her xanax and reports worsening anxiety which she understands makes her SOB worse. Uses marijuana to ease her anxiety but doesn't take her oxygen off. Recently cancelled her PCP appointment which her daughter says happens frequently   Recently finished a zpack and prednisone. Is supposed to be taking '40mg'$  lasix daily but she's only been taking '20mg'$  daily.    Having issues with sleep apnea mask fitting correctly and someone is supposed to come to the home today to look at the mask and the machine. Previously she was "messing with the machine" during a time when she was experiencing AMS.     Review of Systems: [y] = yes, '[ ]'$  = no   General: Weight gain '[ ]'$ ; Weight loss '[ ]'$ ; Anorexia '[ ]'$ ; Fatigue '[ ]'$ ; Fever '[ ]'$ ; Chills '[ ]'$ ; Weakness '[ ]'$   Cardiac: Chest pain/pressure '[ ]'$ ; Resting SOB '[ ]'$ ; Exertional SOB '[ ]'$ ; Orthopnea '[ ]'$ ; Pedal Edema '[ ]'$ ; Palpitations '[ ]'$ ; Syncope '[ ]'$ ; Presyncope '[ ]'$ ;  Paroxysmal nocturnal dyspnea'[ ]'$   Pulmonary: Cough '[ ]'$ ; Wheezing'[ ]'$ ; Hemoptysis'[ ]'$ ; Sputum '[ ]'$ ; Snoring '[ ]'$   GI: Vomiting'[ ]'$ ; Dysphagia'[ ]'$ ; Melena'[ ]'$ ; Hematochezia '[ ]'$ ; Heartburn'[ ]'$ ; Abdominal pain '[ ]'$ ; Constipation '[ ]'$ ; Diarrhea '[ ]'$ ; BRBPR '[ ]'$   GU: Hematuria'[ ]'$ ; Dysuria '[ ]'$ ; Nocturia'[ ]'$   Vascular: Pain in legs with walking '[ ]'$ ; Pain in feet with lying flat '[ ]'$ ; Non-healing sores '[ ]'$ ; Stroke '[ ]'$ ; TIA '[ ]'$ ; Slurred speech '[ ]'$ ;  Neuro: Headaches'[ ]'$ ; Vertigo'[ ]'$ ; Seizures'[ ]'$ ; Paresthesias'[ ]'$ ;Blurred vision '[ ]'$ ; Diplopia '[ ]'$ ; Vision changes '[ ]'$   Ortho/Skin: Arthritis '[ ]'$ ; Joint pain '[ ]'$ ; Muscle pain '[ ]'$ ; Joint swelling '[ ]'$ ; Back Pain '[ ]'$ ; Rash '[ ]'$   Psych: Depression'[ ]'$ ; Anxiety'[ ]'$   Heme: Bleeding problems '[ ]'$ ; Clotting disorders '[ ]'$ ; Anemia '[ ]'$   Endocrine: Diabetes '[ ]'$ ; Thyroid dysfunction'[ ]'$    Past Medical History:  Diagnosis Date   Cardiac arrest ([place]) 05/12/2018   CHF (congestive heart failure) (HCC)    Chicken pox    COPD (chronic obstructive pulmonary disease) (HCC)    Current smoker    Dyspnea    GERD (gastroesophageal reflux disease)    Hypertension    Malfunction of colostomy and enterostomy (HCC)    Opiate abuse, continuous ([place])    Oxygen deficit    Sleep apnea     Current Outpatient  Medications  Medication Sig Dispense Refill   acetaminophen (TYLENOL) 325 MG tablet Take 1-2 tablets (325-650 mg total) by mouth every 4 (four) hours as needed for mild pain. (Patient taking differently: Take 650 mg by mouth 3 (three) times daily.)     albuterol (PROVENTIL) (2.5 MG/3ML) 0.083% nebulizer solution Take 2.5 mg by nebulization every 6 (six) hours as needed for wheezing or shortness of breath. Inhale one vial via nebulizer 4 times a day as directed.     albuterol (VENTOLIN HFA) 108 (90 Base) MCG/ACT inhaler Inhale 2 puffs into the lungs every 6 (six) hours as needed for wheezing or shortness of breath. 18 g 2   ALPRAZolam (XANAX) 0.25 MG tablet Take 1 tablet (0.25 mg total) by mouth 3  (three) times daily as needed for anxiety or sleep. 10 tablet 0   doxycycline (VIBRAMYCIN) 100 MG capsule Take 1 capsule (100 mg total) by mouth 2 (two) times daily. 20 capsule 0   escitalopram (LEXAPRO) 20 MG tablet One '20mg'$  tablet po daily 30 tablet 0   fluticasone (FLONASE) 50 MCG/ACT nasal spray Place 2 sprays into both nostrils 2 (two) times daily as needed.     furosemide (LASIX) 40 MG tablet Take 40 mg by mouth daily.     gabapentin (NEURONTIN) 400 MG capsule Take 1 capsule (400 mg total) by mouth 3 (three) times daily. 90 capsule 1   Melatonin 10 MG TABS Take 30 mg by mouth at bedtime.     Multiple Vitamins-Minerals (MULTIVITAMIN WITH MINERALS) tablet Take 1 tablet by mouth at bedtime.     ondansetron (ZOFRAN) 4 MG tablet Take 4 mg by mouth every 8 (eight) hours as needed for nausea or vomiting.     pantoprazole (PROTONIX) 40 MG tablet Take 40 mg by mouth 2 (two) times daily.     polyethylene glycol (MIRALAX / GLYCOLAX) 17 g packet Take 17 g by mouth daily. 14 each 0   potassium chloride (KLOR-CON M) 20 MEQ tablet Take 1 tablet (20 mEq total) by mouth daily. 30 tablet 0   PRESCRIPTION MEDICATION Inhale into the lungs at bedtime. BiPap     pseudoephedrine (SUDAFED) 30 MG tablet Take 30 mg by mouth every 4 (four) hours as needed for congestion.     QUEtiapine (SEROQUEL) 50 MG tablet Take 50 mg by mouth at bedtime.     Semaglutide, 1 MG/DOSE, 4 MG/3ML SOPN Inject 1 mg into the skin once a week.     WIXELA INHUB 500-50 MCG/ACT AEPB Inhale 1 puff into the lungs every 12 (twelve) hours.     No current facility-administered medications for this visit.    Allergies  Allergen Reactions   Ativan [Lorazepam] Other (See Comments)    D/w with dter Aniceto Boss pt gets very combative and agitated. Has tolerated Versed well   Other Nausea And Vomiting    Anti-depressent that starts with t      Social History   Socioeconomic History   Marital status: Married    Spouse name: Not on file   Number  of children: Not on file   Years of education: Not on file   Highest education level: Not on file  Occupational History   Not on file  Tobacco Use   Smoking status: Former    Packs/day: 1.00    Years: 15.00    Total pack years: 15.00    Types: Cigarettes   Smokeless tobacco: Never   Tobacco comments:    4 cigarettes a day  Vaping Use   Vaping Use: Former   Substances: Nicotine  Substance and Sexual Activity   Alcohol use: Yes    Comment: rarely   Drug use: Yes    Types: Marijuana    Comment: not everyday    Sexual activity: Not on file  Other Topics Concern   Not on file  Social History Narrative   Not on file   Social Determinants of Health   Financial Resource Strain: Not on file  Food Insecurity: No Food Insecurity (10/17/2021)   Hunger Vital Sign    Worried About Running Out of Food in the Last Year: Never true    Ran Out of Food in the Last Year: Never true  Transportation Needs: No Transportation Needs (10/17/2021)   PRAPARE - Hydrologist (Medical): No    Lack of Transportation (Non-Medical): No  Physical Activity: Not on file  Stress: Not on file  Social Connections: Not on file  Intimate Partner Violence: Not At Risk (10/17/2021)   Humiliation, Afraid, Rape, and Kick questionnaire    Fear of Current or Ex-Partner: No    Emotionally Abused: No    Physically Abused: No    Sexually Abused: No      Family History  Problem Relation Age of Onset   Diabetes Mother    COPD Mother    Hypertension Father    Diabetes Father    Heart disease Father    Hyperlipidemia Father    Prostate cancer Father     There were no vitals filed for this visit.  PHYSICAL EXAM: General:  Well appearing. No respiratory difficulty HEENT: normal Neck: supple. no JVD. Carotids 2+ bilat; no bruits. No lymphadenopathy or thryomegaly appreciated. Cor: PMI nondisplaced. Regular rate & rhythm. No rubs, gallops or murmurs. Lungs: clear Abdomen: soft,  nontender, nondistended. No hepatosplenomegaly. No bruits or masses. Good bowel sounds. Extremities: no cyanosis, clubbing, rash, edema Neuro: alert & oriented x 3, cranial nerves grossly intact. moves all 4 extremities w/o difficulty. Affect pleasant.  ECG:   ASSESSMENT & PLAN:   1: Chronic heart failure with preserved ejection fraction with structural changes (LVH)- - NYHA class III - euvolemic today - not weighing but does have scales; instructed to weigh daily and call for an overnight weight gain of > 2 pounds or a weekly weight gain of >5 pounds - not adding salt and is trying to monitor sodium intake in foods they eat - drinking 72-96 ounces of diet mtn dew daily and no water; reviewed the importance of keeping daily fluid intake to 60-64 ounces and be drinking some water in place of the mtn dew - had been taking only '20mg'$  furosemide daily; explained that she needed to take '40mg'$  daily - BNP 10/16/21 was 79.9   2: HTN- - BP looks good (116/82) - saw PCP Tressia Miners) 09/28/21; f/u needs to be r/s - BMP 10/19/21 reviewed and showed sodium 140, potassium 4.1, creatiine 0.6 and GFR >60   3: COPD- - using trilogy but it's not working correctly because during a time she was experiencing AMS, she was "messing with the machine"; someone is coming by this afternoon to look at machine and settings - saw pulmonology Lanney Gins) 07/02/21; returns 11/13/21 - wearing oxygen at 3L around the clock - will add doxycycline '100mg'$  BID   4: Anxiety- - using alprazolam TID PRN although currently doesn't have any - explained that she needed to contact PCP regarding this - taking seroquel QHS and lexapro  once daily - using marijuana for her anxiety; emphasized that if she chooses to smoke this, she must turn her oxygen off and remove herself from any oxygen source due to fire risk   5: Aortic stenosis- - has not seen cardiology before - explained that with her severe COPD, she is probably not a  surgical candidate - they are all in agreement with getting established with cardiology so an appointment was scheduled at [facility]) on 11/05/21  Glori Bickers, MD  1:55 PM

## 2021-11-23 ENCOUNTER — Ambulatory Visit: Payer: Commercial Managed Care - HMO | Admitting: Internal Medicine

## 2021-11-24 ENCOUNTER — Other Ambulatory Visit (HOSPITAL_COMMUNITY): Payer: Self-pay

## 2021-11-26 ENCOUNTER — Ambulatory Visit: Payer: Commercial Managed Care - HMO | Attending: Internal Medicine | Admitting: Internal Medicine

## 2021-11-26 ENCOUNTER — Inpatient Hospital Stay (HOSPITAL_COMMUNITY)
Admission: RE | Admit: 2021-11-26 | Discharge: 2021-11-26 | Disposition: A | Discharge status: Home / Self Care | Payer: Commercial Managed Care - HMO | Source: Ambulatory Visit | Attending: Internal Medicine | Admitting: Internal Medicine

## 2021-11-26 ENCOUNTER — Other Ambulatory Visit (HOSPITAL_COMMUNITY): Payer: Self-pay | Admitting: Internal Medicine

## 2021-11-26 ENCOUNTER — Encounter: Payer: Self-pay | Admitting: Internal Medicine

## 2021-11-26 VITALS — BP 112/70 | HR 95 | Resp 18 | Wt 158.0 lb

## 2021-11-26 DIAGNOSIS — R002 Palpitations: Secondary | ICD-10-CM

## 2021-11-26 DIAGNOSIS — I350 Nonrheumatic aortic (valve) stenosis: Secondary | ICD-10-CM | POA: Diagnosis not present

## 2021-11-26 DIAGNOSIS — J449 Chronic obstructive pulmonary disease, unspecified: Secondary | ICD-10-CM | POA: Diagnosis not present

## 2021-11-26 DIAGNOSIS — I5032 Chronic diastolic (congestive) heart failure: Secondary | ICD-10-CM

## 2021-11-26 NOTE — Patient Instructions (Addendum)
Medication Changes:  No medication changes today. Continue taking medication as prescribed.    Lab Work:  None  Testing/Procedures:  Your physician has requested that you have an echocardiogram in 4 months. Echocardiography is a painless test that uses sound waves to create images of your heart. It provides your doctor with information about the size and shape of your heart and how well your heart's chambers and valves are working. This procedure takes approximately one hour. There are no restrictions for this procedure. Please do NOT wear cologne, perfume, aftershave, or lotions (deodorant is allowed). Please arrive 15 minutes prior to your appointment time.  Someone will call you to schedule the echo closer to the date.   Your provider has recommended that  you wear a Zio Patch for 14 days.  This monitor will record your heart rhythm for our review.  IF you have any symptoms while wearing the monitor please press the button.  If you have any issues with the patch or you notice a red or orange light on it please call the company at [phone].  Once you remove the patch please mail it back to the company as soon as possible so we can get the results.   Referrals:  None  Special Instructions // Education:  Do the following things EVERYDAY: Weigh yourself in the morning before breakfast. Write it down and keep it in a log. Take your medicines as prescribed Eat low salt foods--Limit salt (sodium) to 2000 mg per day.  Stay as active as you can everyday Limit all fluids for the day to less than 2 liters   Follow-Up in:  4 months     If you have any questions or concerns before your next appointment please send [place] a message through [place] or call our office at [phone]

## 2021-11-26 NOTE — Progress Notes (Signed)
ADVANCED HF CLINIC CONSULT NOTE  Referring Physician: Darylene Price, NP Primary Care: Misty Lighter, MD Primary Cardiologist: None   HPI:  Misty Green is a 51 y/o female with severe COPD on home O2 (quit 2021), cardiac arrest 4/20, anxiety, chronic pain syndrome, sleep apnea, h/o diverticulitis a/p partial clectomy 2018 (has colostomy bag), aortic stenosis and chronic diastolic heart failure referred by Misty Green for further evaluation of her HF.   Here with her daughter. Diagnosed with HF when she was 6 months pregnant with her son in 44. Not sure what EF was at that time.   Had cardiac arrest 5/20 with CPR. Developed renal failure. Echo 60-65%. Felt to be primarily respiratory arrest and prescribed BiPAP.   Admitted 10/16/21 due to shortness of breath due to COPD exacerbation and minimal HF.Initially needed bipap with heated high flow but then weaned down to baseline O2 @ 3L.IV solu-medrol given and transitioned to oral prednisone. IV lasix given and then changed to oral diuretics. Discharged after 3 days.    Echo 10/17/21 reviewed and showed an EF of 55-60% along with moderate LVH, mild/moderate LAE, mild RAE and mild MR/AR and severe AS. Referred to HF clinic and saw Misty Green.   I Reviewed echo EF 60% G1DD. Moderate AS mean gradient 24 AVA 1.1cm2   Lives at home with her husband. Wears 3L O2 continuously. Says when she walks to the bathroom sats drop to 80%. Follows with Dr. Laural Green. SOB with any exertion. Very fatigued. Wears BIPAP nightly. HR very labile can be in the 40s or up to 150s. Takes lasix '40mg'$  daily. Takes extra if weight up 3 or more pounds. Has chest tightness every day. Last 5 mins and resolves. Gets very dizzy when she stands.    Has lost 65 over past year or less.   Review of Systems: [y] = yes, '[ ]'$  = no   General: Weight gain '[ ]'$ ; Weight loss [ y]; Anorexia [ y]; Fatigue [ y]; Fever '[ ]'$ ; Chills '[ ]'$ ; Weakness [ y]  Cardiac: Chest pain/pressure [ y];  Resting SOB Misty.Green ]; Exertional SOB '[ ]'$ ; Orthopnea '[ ]'$ ; Pedal Edema [ y]; Palpitations [ y]; Syncope '[ ]'$ ; Presyncope '[ ]'$ ; Paroxysmal nocturnal dyspnea'[ ]'$   Pulmonary: Cough [ y]; Wheezing'[ ]'$ ; Hemoptysis'[ ]'$ ; Sputum '[ ]'$ ; Snoring '[ ]'$   GI: Vomiting'[ ]'$ ; Dysphagia'[ ]'$ ; Melena'[ ]'$ ; Hematochezia '[ ]'$ ; Heartburn'[ ]'$ ; Abdominal pain '[ ]'$ ; Constipation '[ ]'$ ; Diarrhea '[ ]'$ ; BRBPR '[ ]'$   GU: Hematuria'[ ]'$ ; Dysuria '[ ]'$ ; Nocturia'[ ]'$   Vascular: Pain in legs with walking '[ ]'$ ; Pain in feet with lying flat '[ ]'$ ; Non-healing sores '[ ]'$ ; Stroke '[ ]'$ ; TIA '[ ]'$ ; Slurred speech '[ ]'$ ;  Neuro: Headaches'[ ]'$ ; Vertigo'[ ]'$ ; Seizures'[ ]'$ ; Paresthesias'[ ]'$ ;Blurred vision '[ ]'$ ; Diplopia '[ ]'$ ; Vision changes '[ ]'$   Ortho/Skin: Arthritis [ y]; Joint pain [ y]; Muscle pain '[ ]'$ ; Joint swelling '[ ]'$ ; Back Pain '[ ]'$ ; Rash '[ ]'$   Psych: Depression'[ ]'$ ; Anxiety[ y]  Heme: Bleeding problems '[ ]'$ ; Clotting disorders '[ ]'$ ; Anemia '[ ]'$   Endocrine: Diabetes '[ ]'$ ; Thyroid dysfunction'[ ]'$    Past Medical History:  Diagnosis Date   Cardiac arrest ([place]) 05/12/2018   CHF (congestive heart failure) (HCC)    Chicken pox    COPD (chronic obstructive pulmonary disease) ([place])    Current smoker    Dyspnea    GERD (gastroesophageal reflux disease)    Hypertension    Malfunction of colostomy and enterostomy (  [place])    Opiate abuse, continuous (HCC)    Oxygen deficit    Sleep apnea     Current Outpatient Medications  Medication Sig Dispense Refill   acetaminophen (TYLENOL) 325 MG tablet Take 1-2 tablets (325-650 mg total) by mouth every 4 (four) hours as needed for mild pain. (Patient taking differently: Take 650 mg by mouth every 6 (six) hours as needed for mild pain.)     albuterol (PROVENTIL) (2.5 MG/3ML) 0.083% nebulizer solution Take 2.5 mg by nebulization every 6 (six) hours as needed for wheezing or shortness of breath. Inhale one vial via nebulizer 4 times a day as directed.     albuterol (VENTOLIN HFA) 108 (90 Base) MCG/ACT inhaler Inhale 2 puffs into the lungs every 6  (six) hours as needed for wheezing or shortness of breath. 18 g 2   buPROPion (WELLBUTRIN) 75 MG tablet Take 75 mg by mouth 2 (two) times daily.     escitalopram (LEXAPRO) 20 MG tablet One '20mg'$  tablet po daily 30 tablet 0   furosemide (LASIX) 40 MG tablet Take 40 mg by mouth daily.     gabapentin (NEURONTIN) 400 MG capsule Take 1 capsule (400 mg total) by mouth 3 (three) times daily. 90 capsule 1   Melatonin 10 MG TABS Take 20 mg by mouth at bedtime.     Multiple Vitamins-Minerals (MULTIVITAMIN WITH MINERALS) tablet Take 1 tablet by mouth at bedtime.     ondansetron (ZOFRAN) 4 MG tablet Take 4 mg by mouth every 8 (eight) hours as needed for nausea or vomiting.     polyethylene glycol (MIRALAX / GLYCOLAX) 17 g packet Take 17 g by mouth daily. 14 each 0   potassium chloride (KLOR-CON M) 20 MEQ tablet Take 1 tablet (20 mEq total) by mouth daily. 30 tablet 0   PRESCRIPTION MEDICATION Inhale into the lungs at bedtime. BiPap     QUEtiapine (SEROQUEL) 50 MG tablet Take 50 mg by mouth at bedtime.     WIXELA INHUB 500-50 MCG/ACT AEPB Inhale 1 puff into the lungs every 12 (twelve) hours.     No current facility-administered medications for this visit.    Allergies  Allergen Reactions   Ativan [Lorazepam] Other (See Comments)    D/w with dter Misty Green pt gets very combative and agitated. Has tolerated Versed well   Other Nausea And Vomiting    Anti-depressent that starts with t      Social History   Socioeconomic History   Marital status: Married    Spouse name: Not on file   Number of children: Not on file   Years of education: Not on file   Highest education level: Not on file  Occupational History   Not on file  Tobacco Use   Smoking status: Former    Packs/day: 1.00    Years: 15.00    Total pack years: 15.00    Types: Cigarettes   Smokeless tobacco: Never   Tobacco comments:    4 cigarettes a day   Vaping Use   Vaping Use: Former   Substances: Nicotine  Substance and Sexual  Activity   Alcohol use: Yes    Comment: rarely   Drug use: Yes    Types: Marijuana    Comment: not everyday    Sexual activity: Not on file  Other Topics Concern   Not on file  Social History Narrative   Not on file   Social Determinants of Health   Financial Resource Strain: Not on file  Food Insecurity: No Food Insecurity (10/17/2021)   Hunger Vital Sign    Worried About Running Out of Food in the Last Year: Never true    Ran Out of Food in the Last Year: Never true  Transportation Needs: No Transportation Needs (10/17/2021)   PRAPARE - Hydrologist (Medical): No    Lack of Transportation (Non-Medical): No  Physical Activity: Not on file  Stress: Not on file  Social Connections: Not on file  Intimate Partner Violence: Not At Risk (10/17/2021)   Humiliation, Afraid, Rape, and Kick questionnaire    Fear of Current or Ex-Partner: No    Emotionally Abused: No    Physically Abused: No    Sexually Abused: No      Family History  Problem Relation Age of Onset   Diabetes Mother    COPD Mother    Hypertension Father    Diabetes Father    Heart disease Father    Hyperlipidemia Father    Prostate cancer Father     Vitals:   11/26/21 1311  BP: 112/70  Pulse: 95  Resp: 18  SpO2: 96%  Weight: 158 lb (71.7 kg)    PHYSICAL EXAM: General:  Chronically ill appearing on O2 No respiratory difficulty HEENT: normal Neck: supple. no JVD. Carotids 2+ bilat; + bruits. No lymphadenopathy or thryomegaly appreciated. Cor: PMI nondisplaced. Regular rate & rhythm. 3/6 AS with dimished s2 Lungs: markedly reduced BS Abdomen: soft, nontender, nondistended. No hepatosplenomegaly. No bruits or masses. Good bowel sounds. Extremities: no cyanosis, clubbing, rash, edema Neuro: alert & oriented x 3, cranial nerves grossly intact. moves all 4 extremities w/o difficulty. Affect pleasant.  ECG: Sinus 74 No ST-T wave abnormalities. Personally reviewed   ASSESSMENT  & PLAN:   1: Chronic heart failure with preserved ejection fraction with structural changes (LVH) - her main limitation by far is end-stage COPD and not HF. However she des have moderate to severe AS. That said given degree of her COPD I do not think she would get any benefit even if we were able to replace the valve but doubt she is a suitable candidate even for TAVR giver her lung disease (baseline pCO2 ~85, serum bicarb 44)  - NYHA IIIB-IV due to lung disease - Volume status ok. Be careful not to overdiurese with AS - BNP 10/16/21 was 79.9  2. Moderate to severe AS - plan as above - repeat echo 4-6 months   3: End-stage COPD (Gold IV) on home O2 - Following with pulmonology Lanney Gins) 07/02/21; returns 11/13/21 - wearing oxygen at 3L around the clock but still hypoxic with exertion  - baseline pCO2 ~85, serum bicarb 44 - discussed consideration of Palliative Care with her and her daughter  4: Anxiety - per PCP  5. Palpitations - high risk for AF - check Zio   Total time spent 45 minutes. Over half that time spent discussing above.    Glori Bickers, MD  1:13 PM

## 2021-12-07 ENCOUNTER — Telehealth: Payer: Self-pay | Admitting: Internal Medicine

## 2021-12-07 NOTE — Telephone Encounter (Addendum)
Patients daughter Aniceto Boss called and stated that her and her mother discussed it and she would like to be set up with pallative care per Dr. Jacinto Reap Suggestion. Her daughter Aniceto Boss asked for her to be contacted prior to [place] and whether they need to 3 way the patient for whatever is necessary is fine but that that patient has had a hard time really understanding and that she's been having to explain things to her.   Keena Heesch, NT    On 11/30, patient called and requested a palliative referral and referral was sent.

## 2021-12-18 ENCOUNTER — Telehealth: Payer: Self-pay

## 2021-12-18 NOTE — Telephone Encounter (Signed)
PC SW scheduled an initial palliative care visit with patient. RN/SW team will see patient on 01/16/2022 @ 1pm at her home.

## 2021-12-22 ENCOUNTER — Inpatient Hospital Stay
Admission: EM | Admit: 2021-12-22 | Discharge: 2022-01-18 | DRG: 208 | Disposition: E | Payer: BC Managed Care – PPO | Attending: Internal Medicine | Admitting: Internal Medicine

## 2021-12-22 ENCOUNTER — Other Ambulatory Visit: Payer: Self-pay

## 2021-12-22 ENCOUNTER — Other Ambulatory Visit: Payer: Commercial Managed Care - HMO | Admitting: *Deleted

## 2021-12-22 ENCOUNTER — Other Ambulatory Visit: Payer: Commercial Managed Care - HMO

## 2021-12-22 ENCOUNTER — Emergency Department: Payer: BC Managed Care – PPO

## 2021-12-22 VITALS — BP 111/74 | HR 72 | Temp 98.0°F | Resp 24

## 2021-12-22 DIAGNOSIS — Z7951 Long term (current) use of inhaled steroids: Secondary | ICD-10-CM

## 2021-12-22 DIAGNOSIS — G9341 Metabolic encephalopathy: Secondary | ICD-10-CM | POA: Diagnosis present

## 2021-12-22 DIAGNOSIS — I350 Nonrheumatic aortic (valve) stenosis: Secondary | ICD-10-CM | POA: Diagnosis not present

## 2021-12-22 DIAGNOSIS — R809 Proteinuria, unspecified: Secondary | ICD-10-CM | POA: Diagnosis present

## 2021-12-22 DIAGNOSIS — F1120 Opioid dependence, uncomplicated: Secondary | ICD-10-CM | POA: Diagnosis present

## 2021-12-22 DIAGNOSIS — K219 Gastro-esophageal reflux disease without esophagitis: Secondary | ICD-10-CM | POA: Diagnosis present

## 2021-12-22 DIAGNOSIS — I110 Hypertensive heart disease with heart failure: Secondary | ICD-10-CM | POA: Diagnosis present

## 2021-12-22 DIAGNOSIS — Z9049 Acquired absence of other specified parts of digestive tract: Secondary | ICD-10-CM

## 2021-12-22 DIAGNOSIS — J9620 Acute and chronic respiratory failure, unspecified whether with hypoxia or hypercapnia: Secondary | ICD-10-CM | POA: Diagnosis present

## 2021-12-22 DIAGNOSIS — R000 Tachycardia, unspecified: Secondary | ICD-10-CM | POA: Diagnosis present

## 2021-12-22 DIAGNOSIS — E876 Hypokalemia: Secondary | ICD-10-CM | POA: Diagnosis not present

## 2021-12-22 DIAGNOSIS — Z90710 Acquired absence of both cervix and uterus: Secondary | ICD-10-CM

## 2021-12-22 DIAGNOSIS — Z8674 Personal history of sudden cardiac arrest: Secondary | ICD-10-CM | POA: Diagnosis not present

## 2021-12-22 DIAGNOSIS — Z7189 Other specified counseling: Secondary | ICD-10-CM

## 2021-12-22 DIAGNOSIS — G894 Chronic pain syndrome: Secondary | ICD-10-CM | POA: Diagnosis present

## 2021-12-22 DIAGNOSIS — Z1152 Encounter for screening for COVID-19: Secondary | ICD-10-CM

## 2021-12-22 DIAGNOSIS — E8729 Other acidosis: Secondary | ICD-10-CM | POA: Diagnosis present

## 2021-12-22 DIAGNOSIS — F419 Anxiety disorder, unspecified: Secondary | ICD-10-CM | POA: Diagnosis present

## 2021-12-22 DIAGNOSIS — J9621 Acute and chronic respiratory failure with hypoxia: Secondary | ICD-10-CM | POA: Diagnosis not present

## 2021-12-22 DIAGNOSIS — Z888 Allergy status to other drugs, medicaments and biological substances status: Secondary | ICD-10-CM

## 2021-12-22 DIAGNOSIS — Z9981 Dependence on supplemental oxygen: Secondary | ICD-10-CM

## 2021-12-22 DIAGNOSIS — Z515 Encounter for palliative care: Secondary | ICD-10-CM

## 2021-12-22 DIAGNOSIS — J9622 Acute and chronic respiratory failure with hypercapnia: Secondary | ICD-10-CM | POA: Diagnosis present

## 2021-12-22 DIAGNOSIS — J441 Chronic obstructive pulmonary disease with (acute) exacerbation: Principal | ICD-10-CM | POA: Diagnosis present

## 2021-12-22 DIAGNOSIS — Z825 Family history of asthma and other chronic lower respiratory diseases: Secondary | ICD-10-CM

## 2021-12-22 DIAGNOSIS — Z933 Colostomy status: Secondary | ICD-10-CM

## 2021-12-22 DIAGNOSIS — R0689 Other abnormalities of breathing: Principal | ICD-10-CM

## 2021-12-22 DIAGNOSIS — N179 Acute kidney failure, unspecified: Secondary | ICD-10-CM | POA: Diagnosis not present

## 2021-12-22 DIAGNOSIS — D539 Nutritional anemia, unspecified: Secondary | ICD-10-CM | POA: Diagnosis present

## 2021-12-22 DIAGNOSIS — G4733 Obstructive sleep apnea (adult) (pediatric): Secondary | ICD-10-CM | POA: Diagnosis present

## 2021-12-22 DIAGNOSIS — Z66 Do not resuscitate: Secondary | ICD-10-CM | POA: Diagnosis not present

## 2021-12-22 DIAGNOSIS — Z8719 Personal history of other diseases of the digestive system: Secondary | ICD-10-CM | POA: Diagnosis not present

## 2021-12-22 DIAGNOSIS — I5032 Chronic diastolic (congestive) heart failure: Secondary | ICD-10-CM | POA: Diagnosis present

## 2021-12-22 DIAGNOSIS — F17210 Nicotine dependence, cigarettes, uncomplicated: Secondary | ICD-10-CM | POA: Diagnosis present

## 2021-12-22 DIAGNOSIS — Z79899 Other long term (current) drug therapy: Secondary | ICD-10-CM

## 2021-12-22 DIAGNOSIS — Z8249 Family history of ischemic heart disease and other diseases of the circulatory system: Secondary | ICD-10-CM

## 2021-12-22 DIAGNOSIS — Z91199 Patient's noncompliance with other medical treatment and regimen due to unspecified reason: Secondary | ICD-10-CM

## 2021-12-22 DIAGNOSIS — I959 Hypotension, unspecified: Secondary | ICD-10-CM | POA: Diagnosis not present

## 2021-12-22 LAB — CBC
HCT: 34.9 % — ABNORMAL LOW (ref 36.0–46.0)
Hemoglobin: 9.8 g/dL — ABNORMAL LOW (ref 12.0–15.0)
MCH: 29.5 pg (ref 26.0–34.0)
MCHC: 28.1 g/dL — ABNORMAL LOW (ref 30.0–36.0)
MCV: 105.1 fL — ABNORMAL HIGH (ref 80.0–100.0)
Platelets: 206 10*3/uL (ref 150–400)
RBC: 3.32 MIL/uL — ABNORMAL LOW (ref 3.87–5.11)
RDW: 13.4 % (ref 11.5–15.5)
WBC: 7 10*3/uL (ref 4.0–10.5)
nRBC: 0 % (ref 0.0–0.2)

## 2021-12-22 LAB — BLOOD GAS, VENOUS
O2 Saturation: 97.2 %
Patient temperature: 37
pCO2, Ven: >120 mmHg (ref 44–60)
pH, Ven: 7.18 — CL (ref 7.25–7.43)
pO2, Ven: 88 mmHg — ABNORMAL HIGH (ref 32–45)

## 2021-12-22 LAB — TROPONIN I (HIGH SENSITIVITY): Troponin I (High Sensitivity): 7 ng/L (ref <18)

## 2021-12-22 MED ORDER — DOCUSATE SODIUM 50 MG/5ML PO LIQD
100.0000 mg | Freq: Two times a day (BID) | ORAL | Status: DC
  Administered 2021-12-23 – 2021-12-24 (×4): 100 mg
  Filled 2021-12-22 (×4): qty 10

## 2021-12-22 MED ORDER — FENTANYL 2500MCG IN NS 250ML (10MCG/ML) PREMIX INFUSION
0.0000 ug/h | INTRAVENOUS | Status: DC
  Administered 2021-12-22: 25 ug/h via INTRAVENOUS
  Administered 2021-12-24 – 2021-12-25 (×3): 200 ug/h via INTRAVENOUS
  Filled 2021-12-22 (×5): qty 250

## 2021-12-22 MED ORDER — ORAL CARE MOUTH RINSE: 15.0000 mL | OROMUCOSAL | Status: DC | PRN

## 2021-12-22 MED ORDER — DOCUSATE SODIUM 100 MG PO CAPS: 100.0000 mg | ORAL_CAPSULE | Freq: Two times a day (BID) | ORAL | Status: DC | PRN

## 2021-12-22 MED ORDER — ORAL CARE MOUTH RINSE
15.0000 mL | OROMUCOSAL | Status: DC
  Administered 2021-12-23 – 2021-12-25 (×30): 15 mL via OROMUCOSAL
  Filled 2021-12-22 (×5): qty 15

## 2021-12-22 MED ORDER — MIDAZOLAM-SODIUM CHLORIDE 100-0.9 MG/100ML-% IV SOLN
0.5000 mg/h | INTRAVENOUS | Status: DC
  Administered 2021-12-22: 0.5 mg/h via INTRAVENOUS
  Filled 2021-12-22: qty 100

## 2021-12-22 MED ORDER — MIDAZOLAM HCL 2 MG/2ML IJ SOLN
4.0000 mg | Freq: Once | INTRAMUSCULAR | Status: AC
  Administered 2021-12-22: 4 mg via INTRAVENOUS

## 2021-12-22 MED ORDER — PROPOFOL 1000 MG/100ML IV EMUL: 0.0000 ug/kg/min | INTRAVENOUS | Status: DC

## 2021-12-22 MED ORDER — POLYETHYLENE GLYCOL 3350 17 G PO PACK
17.0000 g | PACK | Freq: Every day | ORAL | Status: DC
  Administered 2021-12-23 – 2021-12-24 (×2): 17 g
  Filled 2021-12-22 (×2): qty 1

## 2021-12-22 MED ORDER — POLYETHYLENE GLYCOL 3350 17 G PO PACK: 17.0000 g | PACK | Freq: Every day | ORAL | Status: DC | PRN

## 2021-12-22 MED ORDER — NOREPINEPHRINE 4 MG/250ML-% IV SOLN
0.0000 ug/min | INTRAVENOUS | Status: DC
  Administered 2021-12-23 (×2): 2 ug/min via INTRAVENOUS

## 2021-12-22 MED ORDER — MIDAZOLAM HCL 2 MG/2ML IJ SOLN: 1.0000 mg | INTRAMUSCULAR | Status: DC | PRN

## 2021-12-22 MED ORDER — HEPARIN SODIUM (PORCINE) 5000 UNIT/ML IJ SOLN
5000.0000 [IU] | Freq: Three times a day (TID) | INTRAMUSCULAR | Status: DC
  Administered 2021-12-23 – 2021-12-25 (×7): 5000 [IU] via SUBCUTANEOUS
  Filled 2021-12-22 (×7): qty 1

## 2021-12-22 MED ORDER — FAMOTIDINE 20 MG PO TABS
20.0000 mg | ORAL_TABLET | Freq: Two times a day (BID) | ORAL | Status: DC
  Administered 2021-12-23 – 2021-12-24 (×5): 20 mg
  Filled 2021-12-22 (×5): qty 1

## 2021-12-22 MED ORDER — SODIUM CHLORIDE 0.9 % IV SOLN: 250.0000 mL | INTRAVENOUS | Status: DC

## 2021-12-22 NOTE — H&P (Signed)
NAME:  Misty Green, MRN:  [id number], DOB:  08-20-1970, LOS: 0 ADMISSION DATE:  12/18/2021, CONSULTATION DATE:  12/19/2021 REFERRING MD:  Dr. Joni Fears, CHIEF COMPLAINT:  Respiratory distress  History of Present Illness:  51 yo F presenting [facility] ED from home via EMS due to respiratory distress.  Last 4 days declining,  Upon EMS arrival, patient initially alert with SpO2 in the 60's then became unresponsive in the ambulance.  ED course: Upon arrival patient was unresponsive with EMS performing BVM support. Medications given: *** Initial Vitals: *** Significant labs: (Labs/ Imaging personally reviewed) I, Domingo Pulse Rust-Chester, AGACNP-BC, personally viewed and interpreted this ECG. EKG Interpretation: Date: ***, EKG Time: ***, Rate: 99, Rhythm: NSR, QRS Axis:  normal, Intervals: Bi-atrial enlargement, borderline prolonged Qtc, ST/T Wave abnormalities: none, Narrative Interpretation: NSR with bi-atrial enlargement and borderline prolonged Qtc Chemistry: Na+:***, K+: ***, BUN/Cr.: ***, Serum CO2/ AG: *** Hematology: WBC: 7.0, Hgb: 9.8,  Troponin: ***, BNP: ***, Lactic/ PCT: ***, COVID-19 & Influenza A/B: *** ABG: *** CXR ***: *** CT ***: ***  PCCM consulted for admission due to ***.  Pertinent  Medical History  COPd on chronic 2 L [place] OSA on CPAP  Significant Hospital Events: Including procedures, antibiotic start and stop dates in addition to other pertinent events     Interim History / Subjective:  ***  Objective   Blood pressure (!) 137/91, pulse (!) 116, resp. rate (!) 23, SpO2 100 %.    Vent Mode: AC FiO2 (%):  [40 %] 40 % Set Rate:  [20 bmp] 20 bmp Vt Set:  [420 mL] 420 mL PEEP:  [5 cmH20] 5 cmH20   Intake/Output Summary (Last 24 hours) at 12/21/2021 2347 Last data filed at 12/30/2021 2343 Gross per 24 hour  Intake 0.55 ml  Output --  Net 0.55 ml   There were no vitals filed for this visit.  Examination: General: Adult ***, critically***acutely ill, lying  in bed intubated & sedated requiring mechanical ventilation *** NAD HEENT: MM pink/moist, anicteric***, atraumatic, neck supple Neuro: A&O x *** commands, PERRL *** , MAE CV: s1s2 ***RRR, *** on monitor, no r/m/g Pulm: Regular, non labored on *** , breath sounds ***-BUL & ***-BLL GI: soft, ***, non***tender, bs x 4 GU: foley in place *** with clear yellow urine Skin: *** no rashes/lesions noted Extremities: warm/dry, pulses + 2 R/P, *** edema noted  Resolved Hospital Problem list     Assessment & Plan:  Acute Hypoxic / Hypercapnic Respiratory Failure secondary to / in the setting of PMHx: *** - Ventilator settings: PRVC  6***8 mL/kg, *** FiO2, *** PEEP, continue ventilator support & lung protective strategies - Wean PEEP & FiO2 as tolerated, maintain SpO2 > 90%*** - Head of bed elevated 30 degrees, VAP protocol in place - Plateau pressures less than 30 cm H20  - Intermittent chest x-ray & ABG PRN*** - Daily WUA with SBT as tolerated  - Ensure adequate pulmonary hygiene  - F/u cultures, trend PCT - Continue CAP/***Aspiration Pna coverage cefepime/ *** unasyn - Steroids initiated: solu-medrol *** mg BID /*** - Budesonide inhaler***nebs BID, bronchodilators PRN - PAD protocol in place: continue Fentanyl***Dilaudid drip***IVP & Propofol***Versed drip***IVP     Best Practice (right click and "Reselect all SmartList Selections" daily)  Diet/type: {diet type:25684} DVT prophylaxis: {anticoagulation (Optional):25687} GI prophylaxis: {GE:36629} Lines: {Central Venous Access:25771} Foley:  {Central Venous Access:25691} Code Status:  {Code Status:26939} Last date of multidisciplinary goals of care discussion [***]  Labs   CBC: Recent  Labs  Lab 12/21/2021 2313  WBC 7.0  HGB 9.8*  HCT 34.9*  MCV 105.1*  PLT 841    Basic Metabolic Panel: No results for input(s): "NA", "K", "CL", "CO2", "GLUCOSE", "BUN", "CREATININE", "CALCIUM", "MG", "PHOS" in the last 168 hours. GFR: CrCl  cannot be calculated (Patient's most recent lab result is older than the maximum 21 days allowed.). Recent Labs  Lab 12/31/2021 2313  WBC 7.0    Liver Function Tests: No results for input(s): "AST", "ALT", "ALKPHOS", "BILITOT", "PROT", "ALBUMIN" in the last 168 hours. No results for input(s): "LIPASE", "AMYLASE" in the last 168 hours. No results for input(s): "AMMONIA" in the last 168 hours.  ABG    Component Value Date/Time   PHART 7.4 10/17/2021 0521   PCO2ART 85 (HH) 10/17/2021 0521   PO2ART 67 (L) 10/17/2021 0521   HCO3 PENDING 12/29/2021 2323   TCO2 >50 (H) 07/09/2019 0528   ACIDBASEDEF 1.1 05/20/2018 1240   O2SAT 97.2 01/04/2022 2323     Coagulation Profile: No results for input(s): "INR", "PROTIME" in the last 168 hours.  Cardiac Enzymes: No results for input(s): "CKTOTAL", "CKMB", "CKMBINDEX", "TROPONINI" in the last 168 hours.  HbA1C: Hgb A1c MFr Bld  Date/Time Value Ref Range Status  01/26/2020 03:53 AM 5.3 4.8 - 5.6 % Final    Comment:    (NOTE) Pre diabetes:          5.7%-6.4%  Diabetes:              >6.4%  Glycemic control for   <7.0% adults with diabetes     CBG: No results for input(s): "GLUCAP" in the last 168 hours.  Review of Systems:   UTA- patient intubated and sedated, unable to participate in interview at this time  Past Medical History:  She,  has a past medical history of Cardiac arrest ([place]) (05/12/2018), CHF (congestive heart failure) ([place]), Chicken pox, COPD (chronic obstructive pulmonary disease) ([place]), Current smoker, Dyspnea, GERD (gastroesophageal reflux disease), Hypertension, Malfunction of colostomy and enterostomy ([place]), Opiate abuse, continuous ([place]), Oxygen deficit, and Sleep apnea.   Surgical History:   Past Surgical History:  Procedure Laterality Date   ABDOMINAL HYSTERECTOMY     BACK SURGERY     cesction     COLONOSCOPY WITH PROPOFOL N/A 06/24/2021   Procedure: COLONOSCOPY WITH PROPOFOL;  Surgeon: [name],  DO;  Location: [facility] ENDOSCOPY;  Service: Gastroenterology;  Laterality: N/A;  through stoma   COLOSTOMY  01/13/2018   Procedure: COLOSTOMY Creation;  Surgeon: Jules Husbands, MD;  Location: [facility] ORS;  Service: General;;   ESOPHAGOGASTRODUODENOSCOPY (EGD) WITH PROPOFOL N/A 06/23/2021   Procedure: ESOPHAGOGASTRODUODENOSCOPY (EGD) WITH PROPOFOL;  Surgeon: [name], DO;  Location: [place];  Service: Gastroenterology;  Laterality: N/A;   HERNIA REPAIR     INCISIONAL HERNIA REPAIR     lower midline laparotomy incision (hysterectomy), repaired with mesh   IR FLUORO GUIDE CV LINE RIGHT  05/26/2018   IR REMOVAL TUN CV CATH W/O FL  06/02/2018   IR US GUIDE VASC ACCESS RIGHT  05/26/2018   LAPAROSCOPIC CHOLECYSTECTOMY  2012   LAPAROSCOPIC LYSIS OF ADHESIONS  01/13/2018   Procedure: LAPAROSCOPIC LYSIS OF ADHESIONS;  Surgeon: Jules Husbands, MD;  Location: [facility] ORS;  Service: General;;   LAPAROSCOPIC SIGMOID COLECTOMY  01/13/2018   Procedure: LAPAROSCOPIC SIGMOID COLECTOMY;  Surgeon: Jules Husbands, MD;  Location: [facility] ORS;  Service: General;;   LUMBAR SPINE SURGERY     ORIF WRIST FRACTURE Right  08/25/2015   Procedure: OPEN REDUCTION INTERNAL FIXATION (ORIF) WRIST FRACTURE;  Surgeon: Corky Mull, MD;  Location: [facility] ORS;  Service: Orthopedics;  Laterality: Right;   OVARIAN CYST REMOVAL       Social History:   reports that she has quit smoking. Her smoking use included cigarettes. She has a 15.00 pack-year smoking history. She has never used smokeless tobacco. She reports current alcohol use. She reports current drug use. Drug: Marijuana.   Family History:  Her family history includes COPD in her mother; Diabetes in her father and mother; Heart disease in her father; Hyperlipidemia in her father; Hypertension in her father; Prostate cancer in her father.   Allergies Allergies  Allergen Reactions   Ativan [Lorazepam] Other (See Comments)    D/w with dter Aniceto Boss pt gets very  combative and agitated. Has tolerated Versed well   Other Nausea And Vomiting    Anti-depressent that starts with t     Home Medications  Prior to Admission medications   Medication Sig Start Date End Date Taking? Authorizing Provider  buPROPion (WELLBUTRIN) 75 MG tablet Take 75 mg by mouth 2 (two) times daily.   Yes [provider]  escitalopram (LEXAPRO) 20 MG tablet One '20mg'$  tablet po daily 06/24/21  Yes Wieting, Richard, MD  furosemide (LASIX) 40 MG tablet Take 40 mg by mouth daily. 01/20/20  Yes [provider]  gabapentin (NEURONTIN) 400 MG capsule Take 1 capsule (400 mg total) by mouth 3 (three) times daily. 07/12/19  Yes Mercy Riding, MD  Melatonin 10 MG TABS Take 20 mg by mouth at bedtime.   Yes [provider]  Multiple Vitamins-Minerals (MULTIVITAMIN WITH MINERALS) tablet Take 1 tablet by mouth at bedtime.   Yes [provider]  polyethylene glycol (MIRALAX / GLYCOLAX) 17 g packet Take 17 g by mouth daily. 02/12/20  Yes Eugenie Filler, MD  potassium chloride (KLOR-CON M) 20 MEQ tablet Take 1 tablet (20 mEq total) by mouth daily. 06/24/21  Yes [name], MD  PRESCRIPTION MEDICATION Inhale into the lungs at bedtime. BiPap   Yes [provider]  QUEtiapine (SEROQUEL) 50 MG tablet Take 50 mg by mouth at bedtime. 09/23/21  Yes [provider]  Grant Ruts INHUB 500-50 MCG/ACT AEPB Inhale 1 puff into the lungs every 12 (twelve) hours. 09/28/21  Yes [provider]  acetaminophen (TYLENOL) 325 MG tablet Take 1-2 tablets (325-650 mg total) by mouth every 4 (four) hours as needed for mild pain. Patient taking differently: Take 650 mg by mouth every 6 (six) hours as needed for mild pain. 06/05/18   Love, Ivan Anchors, PA-C  albuterol (PROVENTIL) (2.5 MG/3ML) 0.083% nebulizer solution Take 2.5 mg by nebulization every 6 (six) hours as needed for wheezing or shortness of breath. Inhale one vial via nebulizer 4 times a day as directed.     [provider]  albuterol (VENTOLIN HFA) 108 (90 Base) MCG/ACT inhaler Inhale 2 puffs into the lungs every 6 (six) hours as needed for wheezing or shortness of breath. 10/19/21   Fritzi Mandes, MD  ondansetron (ZOFRAN) 4 MG tablet Take 4 mg by mouth every 8 (eight) hours as needed for nausea or vomiting. Patient not taking: Reported on 12/28/2021    [provider]     Critical care time: ***       Domingo Pulse Rust-[place]ster, AGACNP-BC Acute Care Nurse Practitioner [facility]   [phone] / [phone] Please see Amion for pager details.

## 2021-12-22 NOTE — ED Provider Notes (Signed)
[facility] Provider Note    Event Date/Time   First MD Initiated Contact with Patient 12/21/2021 2312     (approximate)   History   Chief Complaint: Respiratory Arrest   HPI  Misty Green is a 51 y.o. female with a history of COPD, chronic pain syndrome, opioid dependence who was brought to the ED due to respiratory distress.  EMS reports that patient initially was alert with oxygen saturation in the 60s, and during transport she had rapidly declining mental status to the point that she was obtunded and requiring BVM ventilation.  Additional history later obtained from the husband who notes that she has been declining over the past 4 days.  She has had a history of respiratory arrest in the past requiring intubation.  She is at times not compliant with her nighttime CPAP.       Physical Exam   Triage Vital Signs: ED Triage Vitals [12/21/2021 2300]  Enc Vitals Group     BP 118/87     Pulse Rate (!) 114     Resp (!) 24     Temp      Temp src      SpO2 100 %     Weight      Height      Head Circumference      Peak Flow      Pain Score      Pain Loc      Pain Edu?      Excl. in [place]?     Most recent vital signs: Vitals:   01/07/2022 2305 01/03/2022 2310  BP: (!) 134/95 (!) 137/91  Pulse: (!) 112 (!) 116  Resp: 20 (!) 23  SpO2: 100% 100%    General: Obtunded CV:  Good peripheral perfusion.  Tachycardia, heart rate 110 Resp:  Minimal effort, shallow ineffective respirations.  With bagging, there is good air movement and symmetric. Abd:  No distention.  Soft nontender Other:  No evidence of trauma   ED Results / Procedures / Treatments   Labs (all labs ordered are listed, but only abnormal results are displayed) Labs Reviewed  CBC - Abnormal; Notable for the following components:      Result Value   RBC 3.32 (*)    Hemoglobin 9.8 (*)    HCT 34.9 (*)    MCV 105.1 (*)    MCHC 28.1 (*)    All other components within normal limits   BLOOD GAS, VENOUS - Abnormal; Notable for the following components:   pH, Ven 7.18 (*)    pO2, Ven 88 (*)    All other components within normal limits  RESP PANEL BY RT-PCR (FLU A&B, COVID) ARPGX2  URINE CULTURE  BASIC METABOLIC PANEL  URINALYSIS, ROUTINE W REFLEX MICROSCOPIC  CBG MONITORING, ED  TROPONIN I (HIGH SENSITIVITY)     EKG Interpreted by me Sinus rhythm rate of 99.  Normal axis, normal intervals.  Normal QRS ST segments and T waves.   RADIOLOGY Chest x-ray interpreted by me, shows clear lungs bilaterally, ET tube in appropriate position.  Radiology report reviewed.   PROCEDURES:  .Critical Care  Performed by: Carrie Mew, MD Authorized by: Carrie Mew, MD   Critical care provider statement:    Critical care time (minutes):  40   Critical care time was exclusive of:  Separately billable procedures and treating other patients   Critical care was necessary to treat or prevent imminent or life-threatening deterioration of the following  conditions:  Respiratory failure and CNS failure or compromise   Critical care was time spent personally by me on the following activities:  Development of treatment plan with patient or surrogate, discussions with consultants, evaluation of patient's response to treatment, examination of patient, obtaining history from patient or surrogate, ordering and performing treatments and interventions, ordering and review of laboratory studies, ordering and review of radiographic studies, pulse oximetry, re-evaluation of patient's condition and review of old charts   Care discussed with: admitting provider   Comments:        Procedure Name: Intubation Date/Time: 12/23/2021 12:04 AM  Performed by: Carrie Mew, MDPre-anesthesia Checklist: Patient identified, Emergency Drugs available, Suction available and Patient being monitored Preoxygenation: Pre-oxygenation with 100% oxygen Induction Type: IV induction and Rapid  sequence Laryngoscope Size: Glidescope and 3 Tube size: 7.5 mm Number of attempts: 1 Airway Equipment and Method: Video-laryngoscopy Placement Confirmation: ETT inserted through vocal cords under direct vision, CO2 detector and Breath sounds checked- equal and bilateral Secured at: 21 cm Tube secured with: ETT holder Dental Injury: Teeth and Oropharynx as per pre-operative assessment        MEDICATIONS ORDERED IN ED: Medications  midazolam (VERSED) 100 mg/100 mL (1 mg/mL) premix infusion (1 mg/hr Intravenous Infusion Verify 01/12/2022 2343)  fentaNYL 2592mg in NS 2540m(103mml) infusion-PREMIX (50 mcg/hr Intravenous Infusion Verify 12/21/2021 2343)     IMPRESSION / MDM / [place]ake CharlesED COURSE  I reviewed the triage vital signs and the nursing notes.                              Differential diagnosis includes, but is not limited to, CO2 narcosis, opioid intoxication, hypoglycemia, AKI, electrolyte abnormality,  Pneumonia, pleural effusion, pulmonary edema.  Doubt ICH, stroke or trauma  Patient's presentation is most consistent with acute presentation with potential threat to life or bodily function.  Patient presents with obtundation, shallow respirations, markedly elevated end-tidal CO2 by EMS.  Patient intubated on arrival for respiratory failure and airway protection  ----------------------------------------- 12:01 AM on 12/23/2021 ----------------------------------------- VBG confirms PCO2 greater than 150, pH 7.1.  Case discussed with the ICU team.       FINAL CLINICAL IMPRESSION(S) / ED DIAGNOSES   Final diagnoses:  CO2 narcosis  Acute on chronic respiratory failure with hypoxia and hypercapnia (HCC[place]   Rx / DC Orders   ED Discharge Orders     None        Note:  This document was prepared using Dragon voice recognition software and may include unintentional dictation errors.   StaCarrie MewD 12/23/21 0005

## 2021-12-22 NOTE — ED Triage Notes (Signed)
Arrived via EMS from home for respiratory distress. Patient initially alert with oxygen saturation in 60's but became unresponsive in ambulance. EMS arrived with patient unresponsive performing BVM assisted ventilation.

## 2021-12-22 NOTE — ED Notes (Signed)
150 mg ketamine IV given at 2254. 80 mg rocuronium given at 2255. 7.5 ET tube placed 21 @ teeth at 2258 with positive color change and bilateral breath sounds.  Radiology called for portable chest xray at 2259.  OG placed 55 at lip at 2302.

## 2021-12-22 NOTE — ED Notes (Signed)
Dr. Leonides Schanz notified of critical results of VBG.

## 2021-12-22 NOTE — ED Notes (Incomplete)
150 mg ketamine IV given at 2254. 80 mg rocuronium given at 2255. 7.5 ET tube placed 21 @ teeth at 2258

## 2021-12-23 ENCOUNTER — Inpatient Hospital Stay: Payer: BC Managed Care – PPO

## 2021-12-23 DIAGNOSIS — J9622 Acute and chronic respiratory failure with hypercapnia: Secondary | ICD-10-CM | POA: Diagnosis not present

## 2021-12-23 DIAGNOSIS — Z7189 Other specified counseling: Secondary | ICD-10-CM | POA: Diagnosis not present

## 2021-12-23 DIAGNOSIS — J9621 Acute and chronic respiratory failure with hypoxia: Secondary | ICD-10-CM | POA: Diagnosis not present

## 2021-12-23 LAB — URINALYSIS, ROUTINE W REFLEX MICROSCOPIC
Bacteria, UA: NONE SEEN
Bilirubin Urine: NEGATIVE
Glucose, UA: NEGATIVE mg/dL
Ketones, ur: NEGATIVE mg/dL
Leukocytes,Ua: NEGATIVE
Nitrite: NEGATIVE
Protein, ur: >=300 mg/dL — AB
Specific Gravity, Urine: 1.024 (ref 1.005–1.030)
pH: 5 (ref 5.0–8.0)

## 2021-12-23 LAB — T4, FREE: Free T4: 0.71 ng/dL (ref 0.61–1.12)

## 2021-12-23 LAB — BLOOD GAS, VENOUS
Acid-Base Excess: 23.9 mmol/L — ABNORMAL HIGH (ref 0.0–2.0)
Acid-Base Excess: 25.3 mmol/L — ABNORMAL HIGH (ref 0.0–2.0)
Bicarbonate: 52.1 mmol/L — ABNORMAL HIGH (ref 20.0–28.0)
Bicarbonate: 55.8 mmol/L — ABNORMAL HIGH (ref 20.0–28.0)
FIO2: 40 %
MECHVT: 450 mL
Mechanical Rate: 20
O2 Saturation: 96.3 %
O2 Saturation: 83.2 %
PEEP: 5 cmH2O
Patient temperature: 37
Patient temperature: 37
pCO2, Ven: 70 mmHg — ABNORMAL HIGH (ref 44–60)
pCO2, Ven: 88 mmHg (ref 44–60)
pH, Ven: 7.48 — ABNORMAL HIGH (ref 7.25–7.43)
pH, Ven: 7.41 (ref 7.25–7.43)
pO2, Ven: 68 mmHg — ABNORMAL HIGH (ref 32–45)
pO2, Ven: 53 mmHg — ABNORMAL HIGH (ref 32–45)

## 2021-12-23 LAB — GLUCOSE, CAPILLARY
Glucose-Capillary: 100 mg/dL — ABNORMAL HIGH (ref 70–99)
Glucose-Capillary: 104 mg/dL — ABNORMAL HIGH (ref 70–99)
Glucose-Capillary: 110 mg/dL — ABNORMAL HIGH (ref 70–99)
Glucose-Capillary: 118 mg/dL — ABNORMAL HIGH (ref 70–99)
Glucose-Capillary: 123 mg/dL — ABNORMAL HIGH (ref 70–99)
Glucose-Capillary: 134 mg/dL — ABNORMAL HIGH (ref 70–99)
Glucose-Capillary: 92 mg/dL (ref 70–99)
Glucose-Capillary: 96 mg/dL (ref 70–99)

## 2021-12-23 LAB — RETICULOCYTES
Immature Retic Fract: 28.9 % — ABNORMAL HIGH (ref 2.3–15.9)
RBC.: 3.61 MIL/uL — ABNORMAL LOW (ref 3.87–5.11)
Retic Count, Absolute: 113 10*3/uL (ref 19.0–186.0)
Retic Ct Pct: 3.1 % (ref 0.4–3.1)

## 2021-12-23 LAB — BASIC METABOLIC PANEL
Anion gap: 7 (ref 5–15)
BUN: 15 mg/dL (ref 6–20)
BUN: 16 mg/dL (ref 6–20)
CO2: >45 mmol/L — ABNORMAL HIGH (ref 22–32)
CO2: 44 mmol/L — ABNORMAL HIGH (ref 22–32)
Calcium: 9 mg/dL (ref 8.9–10.3)
Calcium: 8.7 mg/dL — ABNORMAL LOW (ref 8.9–10.3)
Chloride: 88 mmol/L — ABNORMAL LOW (ref 98–111)
Chloride: 89 mmol/L — ABNORMAL LOW (ref 98–111)
Creatinine, Ser: 0.47 mg/dL (ref 0.44–1.00)
Creatinine, Ser: 0.48 mg/dL (ref 0.44–1.00)
GFR, Estimated: >60 mL/min (ref >60)
GFR, Estimated: >60 mL/min (ref >60)
Glucose, Bld: 114 mg/dL — ABNORMAL HIGH (ref 70–99)
Glucose, Bld: 97 mg/dL (ref 70–99)
Potassium: 4 mmol/L (ref 3.5–5.1)
Potassium: 4.2 mmol/L (ref 3.5–5.1)
Sodium: 141 mmol/L (ref 135–145)
Sodium: 140 mmol/L (ref 135–145)

## 2021-12-23 LAB — BLOOD GAS, ARTERIAL
Acid-Base Excess: 23.6 mmol/L — ABNORMAL HIGH (ref 0.0–2.0)
Bicarbonate: 55.6 mmol/L — ABNORMAL HIGH (ref 20.0–28.0)
FIO2: 40 %
MECHVT: 420 mL
O2 Saturation: 57.3 %
PEEP: 5 cmH2O
Patient temperature: 37
RATE: 20 resp/min
pCO2 arterial: 103 mmHg (ref 32–48)
pH, Arterial: 7.34 — ABNORMAL LOW (ref 7.35–7.45)
pO2, Arterial: 33 mmHg — CL (ref 83–108)

## 2021-12-23 LAB — PROCALCITONIN: Procalcitonin: <0.1 ng/mL

## 2021-12-23 LAB — CBC
HCT: 31.9 % — ABNORMAL LOW (ref 36.0–46.0)
Hemoglobin: 9.1 g/dL — ABNORMAL LOW (ref 12.0–15.0)
MCH: 28.9 pg (ref 26.0–34.0)
MCHC: 28.5 g/dL — ABNORMAL LOW (ref 30.0–36.0)
MCV: 101.3 fL — ABNORMAL HIGH (ref 80.0–100.0)
Platelets: 198 10*3/uL (ref 150–400)
RBC: 3.15 MIL/uL — ABNORMAL LOW (ref 3.87–5.11)
RDW: 13.5 % (ref 11.5–15.5)
WBC: 7.2 10*3/uL (ref 4.0–10.5)
nRBC: 0 % (ref 0.0–0.2)

## 2021-12-23 LAB — URINE DRUG SCREEN, QUALITATIVE (ARMC ONLY)
Amphetamines, Ur Screen: NOT DETECTED
Barbiturates, Ur Screen: NOT DETECTED
Benzodiazepine, Ur Scrn: NOT DETECTED
Cannabinoid 50 Ng, Ur ~~LOC~~: POSITIVE — AB
Cocaine Metabolite,Ur ~~LOC~~: NOT DETECTED
MDMA (Ecstasy)Ur Screen: NOT DETECTED
Methadone Scn, Ur: NOT DETECTED
Opiate, Ur Screen: NOT DETECTED
Phencyclidine (PCP) Ur S: NOT DETECTED
Tricyclic, Ur Screen: POSITIVE — AB

## 2021-12-23 LAB — FERRITIN: Ferritin: 9 ng/mL — ABNORMAL LOW (ref 11–307)

## 2021-12-23 LAB — MAGNESIUM
Magnesium: 1.9 mg/dL (ref 1.7–2.4)
Magnesium: 1.9 mg/dL (ref 1.7–2.4)

## 2021-12-23 LAB — IRON AND TIBC
Iron: 23 ug/dL — ABNORMAL LOW (ref 28–170)
Saturation Ratios: 5 % — ABNORMAL LOW (ref 10.4–31.8)
TIBC: 441 ug/dL (ref 250–450)
UIBC: 418 ug/dL

## 2021-12-23 LAB — HEPATIC FUNCTION PANEL
ALT: 14 U/L (ref 0–44)
AST: 20 U/L (ref 15–41)
Albumin: 3.3 g/dL — ABNORMAL LOW (ref 3.5–5.0)
Alkaline Phosphatase: 47 U/L (ref 38–126)
Bilirubin, Direct: <0.1 mg/dL (ref 0.0–0.2)
Total Bilirubin: 0.7 mg/dL (ref 0.3–1.2)
Total Protein: 6.7 g/dL (ref 6.5–8.1)

## 2021-12-23 LAB — PHOSPHORUS
Phosphorus: 4.6 mg/dL (ref 2.5–4.6)
Phosphorus: 2.3 mg/dL — ABNORMAL LOW (ref 2.5–4.6)

## 2021-12-23 LAB — TROPONIN I (HIGH SENSITIVITY): Troponin I (High Sensitivity): 10 ng/L (ref <18)

## 2021-12-23 LAB — RESP PANEL BY RT-PCR (FLU A&B, COVID) ARPGX2
Influenza A by PCR: NEGATIVE
Influenza B by PCR: NEGATIVE
SARS Coronavirus 2 by RT PCR: NEGATIVE

## 2021-12-23 LAB — HEMOGLOBIN A1C
Hgb A1c MFr Bld: 5.3 % (ref 4.8–5.6)
Mean Plasma Glucose: 105 mg/dL

## 2021-12-23 LAB — BRAIN NATRIURETIC PEPTIDE: B Natriuretic Peptide: 140.7 pg/mL — ABNORMAL HIGH (ref 0.0–100.0)

## 2021-12-23 LAB — FOLATE: Folate: 5.5 ng/mL — ABNORMAL LOW (ref >5.9)

## 2021-12-23 LAB — MRSA NEXT GEN BY PCR, NASAL: MRSA by PCR Next Gen: NOT DETECTED

## 2021-12-23 LAB — VITAMIN B12: Vitamin B-12: 256 pg/mL (ref 180–914)

## 2021-12-23 LAB — LACTIC ACID, PLASMA: Lactic Acid, Venous: 1.9 mmol/L (ref 0.5–1.9)

## 2021-12-23 LAB — TSH: TSH: 0.995 u[IU]/mL (ref 0.350–4.500)

## 2021-12-23 MED ORDER — FREE WATER
30.0000 mL | Status: DC
  Administered 2021-12-23 – 2021-12-25 (×11): 30 mL

## 2021-12-23 MED ORDER — CHLORHEXIDINE GLUCONATE CLOTH 2 % EX PADS
6.0000 | MEDICATED_PAD | Freq: Every day | CUTANEOUS | Status: DC
  Administered 2021-12-23 – 2021-12-24 (×2): 6 via TOPICAL

## 2021-12-23 MED ORDER — MIDAZOLAM HCL 2 MG/2ML IJ SOLN: 2.0000 mg | INTRAMUSCULAR | Status: DC | PRN

## 2021-12-23 MED ORDER — MIDAZOLAM BOLUS VIA INFUSION
1.0000 mg | INTRAVENOUS | Status: DC | PRN
  Administered 2021-12-23: 1 mg via INTRAVENOUS

## 2021-12-23 MED ORDER — ESCITALOPRAM OXALATE 20 MG PO TABS
20.0000 mg | ORAL_TABLET | Freq: Every day | ORAL | Status: DC
  Administered 2021-12-23 – 2021-12-24 (×2): 20 mg
  Filled 2021-12-23 (×3): qty 1

## 2021-12-23 MED ORDER — NOREPINEPHRINE 4 MG/250ML-% IV SOLN
INTRAVENOUS | Status: AC
  Filled 2021-12-23: qty 250

## 2021-12-23 MED ORDER — METHYLPREDNISOLONE SODIUM SUCC 40 MG IJ SOLR
20.0000 mg | Freq: Two times a day (BID) | INTRAMUSCULAR | Status: DC
  Administered 2021-12-23 – 2021-12-25 (×4): 20 mg via INTRAVENOUS
  Filled 2021-12-23 (×4): qty 1

## 2021-12-23 MED ORDER — IPRATROPIUM-ALBUTEROL 0.5-2.5 (3) MG/3ML IN SOLN
3.0000 mL | RESPIRATORY_TRACT | Status: DC
  Administered 2021-12-23 – 2021-12-25 (×13): 3 mL via RESPIRATORY_TRACT
  Filled 2021-12-23 (×12): qty 3

## 2021-12-23 MED ORDER — MIDAZOLAM-SODIUM CHLORIDE 100-0.9 MG/100ML-% IV SOLN
0.5000 mg/h | INTRAVENOUS | Status: DC
  Administered 2021-12-23: 3 mg/h via INTRAVENOUS
  Administered 2021-12-23: 2 mg/h via INTRAVENOUS
  Administered 2021-12-23: 3 mg/h via INTRAVENOUS
  Administered 2021-12-25: 4 mg/h via INTRAVENOUS
  Filled 2021-12-23 (×2): qty 100

## 2021-12-23 MED ORDER — FOLIC ACID 1 MG PO TABS
1.0000 mg | ORAL_TABLET | Freq: Every day | ORAL | Status: DC
  Administered 2021-12-24: 1 mg
  Filled 2021-12-23: qty 1

## 2021-12-23 MED ORDER — LORAZEPAM 2 MG/ML IJ SOLN: 2.0000 mg | INTRAMUSCULAR | Status: DC | PRN

## 2021-12-23 MED ORDER — METHYLPREDNISOLONE SODIUM SUCC 40 MG IJ SOLR
40.0000 mg | Freq: Two times a day (BID) | INTRAMUSCULAR | Status: DC
  Administered 2021-12-23: 40 mg via INTRAVENOUS
  Filled 2021-12-23: qty 1

## 2021-12-23 MED ORDER — QUETIAPINE FUMARATE 25 MG PO TABS
50.0000 mg | ORAL_TABLET | Freq: Every day | ORAL | Status: DC
  Administered 2021-12-23 – 2021-12-24 (×3): 50 mg
  Filled 2021-12-23 (×3): qty 2

## 2021-12-23 MED ORDER — FENTANYL CITRATE PF 50 MCG/ML IJ SOSY: 50.0000 ug | PREFILLED_SYRINGE | INTRAMUSCULAR | Status: DC | PRN

## 2021-12-23 MED ORDER — DEXMEDETOMIDINE HCL IN NACL 400 MCG/100ML IV SOLN
0.4000 ug/kg/h | INTRAVENOUS | Status: DC
  Administered 2021-12-23: 0.4 ug/kg/h via INTRAVENOUS
  Administered 2021-12-24: 0.8 ug/kg/h via INTRAVENOUS
  Administered 2021-12-24: 0.4 ug/kg/h via INTRAVENOUS
  Filled 2021-12-23 (×4): qty 100

## 2021-12-23 MED ORDER — PROPOFOL 1000 MG/100ML IV EMUL
INTRAVENOUS | Status: AC
  Filled 2021-12-23: qty 100

## 2021-12-23 MED ORDER — MIDAZOLAM BOLUS VIA INFUSION
1.0000 mg | INTRAVENOUS | Status: DC | PRN
  Administered 2021-12-24: 2 mg via INTRAVENOUS
  Administered 2021-12-25: 1 mg via INTRAVENOUS

## 2021-12-23 MED ORDER — VITAL AF 1.2 CAL PO LIQD
1000.0000 mL | ORAL | Status: DC
  Administered 2021-12-23: 1000 mL

## 2021-12-23 MED ORDER — BUDESONIDE 0.5 MG/2ML IN SUSP
0.5000 mg | Freq: Two times a day (BID) | RESPIRATORY_TRACT | Status: DC
  Administered 2021-12-23 – 2021-12-25 (×5): 0.5 mg via RESPIRATORY_TRACT
  Filled 2021-12-23 (×4): qty 2

## 2021-12-23 MED ORDER — PROPOFOL 1000 MG/100ML IV EMUL: 5.0000 ug/kg/min | INTRAVENOUS | Status: DC

## 2021-12-23 MED ORDER — INSULIN ASPART 100 UNIT/ML IJ SOLN
0.0000 [IU] | INTRAMUSCULAR | Status: DC
  Administered 2021-12-23 – 2021-12-25 (×9): 3 [IU] via SUBCUTANEOUS
  Filled 2021-12-23 (×9): qty 1

## 2021-12-23 MED ORDER — IPRATROPIUM-ALBUTEROL 0.5-2.5 (3) MG/3ML IN SOLN
3.0000 mL | Freq: Four times a day (QID) | RESPIRATORY_TRACT | Status: DC
  Administered 2021-12-23: 3 mL via RESPIRATORY_TRACT
  Filled 2021-12-23 (×2): qty 3

## 2021-12-23 MED ORDER — BUDESONIDE 0.25 MG/2ML IN SUSP
0.2500 mg | Freq: Two times a day (BID) | RESPIRATORY_TRACT | Status: DC
  Filled 2021-12-23: qty 2

## 2021-12-23 MED ORDER — VECURONIUM BROMIDE 10 MG IV SOLR
10.0000 mg | INTRAVENOUS | Status: DC | PRN
  Administered 2021-12-23 (×3): 10 mg via INTRAVENOUS
  Filled 2021-12-23 (×2): qty 10

## 2021-12-23 MED ORDER — FENTANYL BOLUS VIA INFUSION: 50.0000 ug | INTRAVENOUS | Status: DC | PRN

## 2021-12-23 NOTE — Progress Notes (Signed)
Transported pt to CT then to ICU 2 on the vent without incident. Pt remains on the vent and is tol well at this time. Report given to ICU RT.

## 2021-12-23 NOTE — Progress Notes (Signed)
PHARMACY CONSULT NOTE   Pharmacy Consult for Electrolyte Monitoring and Replacement   Recent Labs: Potassium (mmol/L)  Date Value  12/23/2021 4.2   Magnesium (mg/dL)  Date Value  12/23/2021 1.9   Calcium (mg/dL)  Date Value  12/23/2021 8.7 (L)   Albumin (g/dL)  Date Value  12/29/2021 3.3 (L)   Phosphorus (mg/dL)  Date Value  12/23/2021 2.3 (L)   Sodium (mmol/L)  Date Value  12/23/2021 140    Assessment: 51 y.o. female with medical history significant for diverticulitis s/p colostomy, incisional hernia s/p repair, GERD, HTN, substance abuse, chronic hypoxic hypercapnic respiratory failure, severe COPD, chronic diastolic heart failure, chronic tobacco abuse and generalized anxiety disorder who is admitted with end stage COPD with severe aortic stenosis and acute cardiac failure. Pharmacy is asked to follow and replace electrolytes while in CCU  Goal of Therapy:  Potassium 4.0 - 5.1 mmol/L Magnesium 2.0 - 2.4 mg/dL All Other Electrolytes WNL  Plan: phos borderline No electrolyte replacement warranted for today Recheck electrolytes in am  Dallie Piles ,PharmD Clinical Pharmacist 12/23/2021 7:09 AM

## 2021-12-23 NOTE — Procedures (Signed)
Central Venous Catheter Insertion Procedure Note  Misty Green  [id number]  [birth year]  Date:12/23/21  Time:6:44 PM   Provider Performing:Hermione Havlicek D Dewaine Conger   Procedure: Insertion of Non-tunneled Central Venous [phone]) with US guidance ([id number])   Indication(s) Medication administration and Difficult access  Consent Risks of the procedure as well as the alternatives and risks of each were explained to the patient and/or caregiver.  Consent for the procedure was obtained and is signed in the bedside chart  Anesthesia Topical only with 1% lidocaine   Timeout Verified patient identification, verified procedure, site/side was marked, verified correct patient position, special equipment/implants available, medications/allergies/relevant history reviewed, required imaging and test results available.  Sterile Technique Maximal sterile technique including full sterile barrier drape, hand hygiene, sterile gown, sterile gloves, mask, hair covering, sterile ultrasound probe cover (if used).  Procedure Description Area of catheter insertion was cleaned with chlorhexidine and draped in sterile fashion.  With real-time ultrasound guidance a central venous catheter was placed into the left internal jugular vein. Nonpulsatile blood flow and easy flushing noted in all ports.  The catheter was sutured in place and sterile dressing applied.  Complications/Tolerance None; patient tolerated the procedure well. Chest X-ray is ordered to verify placement for internal jugular or subclavian cannulation.   Chest x-ray is not ordered for femoral cannulation.  EBL Minimal  Specimen(s) None   Line secured at the 19 cm mark. BIOPATCH applied to the insertion site.   Darel Hong, AGACNP-BC [facility] Prefer epic messenger for cross cover needs If after hours, please call E-link

## 2021-12-23 NOTE — Progress Notes (Signed)
eLink Physician-Brief Progress Note Patient Name: Misty Green DOB: 03-Aug-1970 MRN: [id number]   Date of Service  12/23/2021  HPI/Events of Note  51 yr old female with severe COPD with associated chronic hypoxemia and hypercapnia on 3 L [place] and severe aortic stenosis presented with acute on chronic resp failure with PCO2>120 obtunded requiring intubation.  Cxr shows hyperinflated lungs w/o acute infiltrate.  WBC normal and creat normal.  BNP 140.  Etiology for acute decompensation unclear.  ? Progression of underlying disorders.  eICU Interventions  Chart reviewed     Intervention Category Evaluation Type: New Patient Evaluation  Mauri [place], P 12/23/2021, 2:32 AM

## 2021-12-23 NOTE — Plan of Care (Signed)
PMT to meet with family at 10:00 tomorrow morning.

## 2021-12-23 NOTE — Progress Notes (Signed)
NAME:  Misty Green, MRN:  [id number], DOB:  1970/03/20, LOS: 1 ADMISSION DATE:  01/15/2022   BRIEF SYNOPSIS end-stage COPD and severe aortic stenosis.   History of Present Illness:   patient was recently hospitalized between 10/16/2021 and 10/19/21 for acute on chronic respiratory failure with hypoxia, she was supported with BiPAP & diuresis and discharged home.  She was also previously intubated requiring mechanical ventilatory support for acute on chronic respiratory failure in January 2022. met with Dr. Haroldine Laws with cardiology to evaluate her severe aortic stenosis in the setting of chronic diastolic heart failure.  Per chart review it was determined she is not a candidate for TAVR.  Her husband intimated she was told she only had a " year to live".  This was explained to the patient to be because of her end-stage COPD and severe aortic stenosis.    Significant Hospital Events: Including procedures, antibiotic start and stop dates in addition to other pertinent events   01/08/2022: Admit to ICU with acute on chronic hypoxic & hypercarbic respiratory failure secondary to end-stage COPD requiring emergent intubation and mechanical ventilatory support. 12/6 severe resp failure       Interim History / Subjective:  Remains intubated       Objective   Blood pressure (!) 104/56, pulse 66, temperature 99.5 F (37.5 C), resp. rate 20, weight 41 kg, SpO2 99 %.    Vent Mode: PRVC FiO2 (%):  [40 %] 40 % Set Rate:  [20 bmp] 20 bmp Vt Set:  [420 mL] 420 mL PEEP:  [5 cmH20] 5 cmH20 Plateau Pressure:  [24 cmH20] 24 cmH20   Intake/Output Summary (Last 24 hours) at 12/23/2021 3267 Last data filed at 12/23/2021 0600 Gross per 24 hour  Intake 189.31 ml  Output 105 ml  Net 84.31 ml   Filed Weights   12/23/21 0339  Weight: 41 kg    REVIEW OF SYSTEMS  PATIENT IS UNABLE TO PROVIDE COMPLETE REVIEW OF SYSTEMS DUE TO SEVERE CRITICAL ILLNESS   PHYSICAL  EXAMINATION:  GENERAL:critically ill appearing, +resp distress EYES: Pupils equal, round, reactive to light.  No scleral icterus.  MOUTH: Moist mucosal membrane. INTUBATED NECK: Supple.  PULMONARY: Lungs clear to auscultation, +rhonchi, +wheezing CARDIOVASCULAR: S1 and S2.  Regular rate and rhythm GASTROINTESTINAL: Soft, nontender, -distended. Positive bowel sounds.  MUSCULOSKELETAL: No swelling, clubbing, or edema.  NEUROLOGIC: obtunded,sedated SKIN:normal, warm to touch, Capillary refill delayed  Pulses present bilaterally   Labs/imaging that I havepersonally reviewed  (right click and "Reselect all SmartList Selections" daily)     ASSESSMENT AND PLAN SYNOPSIS  51 yo white female with end stage COPD with severe aortic stenosis, patient with acute cardiac failure and acute COPD exacerbation leading to intubation  Severe ACUTE Hypoxic and Hypercapnic Respiratory Failure -continue Mechanical Ventilator support -Wean Fio2 and PEEP as tolerated -VAP/VENT bundle implementation - Wean PEEP & FiO2 as tolerated, maintain SpO2 > 88% - Head of bed elevated 30 degrees, VAP protocol in place - Plateau pressures less than 30 cm H20  - Intermittent chest x-ray & ABG PRN - Ensure adequate pulmonary hygiene  -will perform SAT/SBT when respiratory parameters are met  Vent Mode: PRVC FiO2 (%):  [40 %] 40 % Set Rate:  [20 bmp] 20 bmp Vt Set:  [420 mL] 420 mL PEEP:  [5 cmH20] 5 cmH20 Plateau Pressure:  [24 cmH20] 24 cmH20   SEVERE COPD EXACERBATION -continue IV steroids as prescribed -continue NEB THERAPY as prescribed -morphine as needed -wean fio2 as  needed and tolerated    CARDIAC FAILURE-SEVERE AS -oxygen as needed -Lasix as tolerated -follow up cardiac enzymes as indicated Follow up with cardiology evaluation   CARDIAC ICU monitoring   ACUTE KIDNEY INJURY/Renal Failure -continue Foley Catheter-assess need -Avoid nephrotoxic agents -Follow urine output, BMP -Ensure  adequate renal perfusion, optimize oxygenation -Renal dose medications   Intake/Output Summary (Last 24 hours) at 12/23/2021 0714 Last data filed at 12/23/2021 0600 Gross per 24 hour  Intake 189.31 ml  Output 105 ml  Net 84.31 ml     NEUROLOGY Acute  metabolic encephalopathy, need for sedation Goal RASS -2 to -3   INFECTIOUS DISEASE -follow up cultures   ENDO - ICU hypoglycemic\Hyperglycemia protocol -check FSBS per protocol   GI GI PROPHYLAXIS as indicated  NUTRITIONAL STATUS DIET-->TF's as tolerated Constipation protocol as indicated   ELECTROLYTES -follow labs as needed -replace as needed -pharmacy consultation and following    Best Practice (right click and "Reselect all SmartList Selections" daily)  Diet/type: NPO w/ meds via tube DVT prophylaxis: prophylactic heparin  GI prophylaxis: H2B Lines: N/A Foley:  Yes, and it is still needed Code Status:  full code   Labs   CBC: Recent Labs  Lab 12/24/2021 2313  WBC 7.0  HGB 9.8*  HCT 34.9*  MCV 105.1*  PLT 809    Basic Metabolic Panel: Recent Labs  Lab 01/17/2022 2313 12/23/21 0454  NA 141 140  K 4.0 4.2  CL 88* 89*  CO2 >45* 44*  GLUCOSE 114* 97  BUN 15 16  CREATININE 0.47 0.48  CALCIUM 9.0 8.7*  MG 1.9 1.9  PHOS 4.6 2.3*   GFR: Estimated Creatinine Clearance: 53.2 mL/min (by C-G formula based on SCr of 0.48 mg/dL). Recent Labs  Lab 01/07/2022 2313 12/23/21 0238  PROCALCITON <0.10  --   WBC 7.0  --   LATICACIDVEN  --  1.9    Liver Function Tests: Recent Labs  Lab 01/05/2022 2313  AST 20  ALT 14  ALKPHOS 47  BILITOT 0.7  PROT 6.7  ALBUMIN 3.3*   No results for input(s): "LIPASE", "AMYLASE" in the last 168 hours. No results for input(s): "AMMONIA" in the last 168 hours.  ABG    Component Value Date/Time   PHART 7.34 (L) 01/16/2022 2357   PCO2ART 103 (HH) 01/05/2022 2357   PO2ART 33 (LL) 12/19/2021 2357   HCO3 55.6 (H) 12/26/2021 2357   TCO2 >50 (H) 07/09/2019 0528    ACIDBASEDEF 1.1 05/20/2018 1240   O2SAT 57.3 01/13/2022 2357    CBG: Recent Labs  Lab 12/24/2021 2250 12/23/21 0353  GLUCAP 110* 104*    Allergies Allergies  Allergen Reactions   Ativan [Lorazepam] Other (See Comments)    D/w with dter Aniceto Boss pt gets very combative and agitated. Has tolerated Versed well   Other Nausea And Vomiting    Anti-depressent that starts with t       DVT/GI PRX  assessed I Assessed the need for Labs I Assessed the need for Foley I Assessed the need for Central Venous Line Family Discussion when available I Assessed the need for Mobilization I made an Assessment of medications to be adjusted accordingly Safety Risk assessment completed  CASE DISCUSSED IN MULTIDISCIPLINARY ROUNDS WITH ICU TEAM     Critical Care Time devoted to patient care services described in this note is 55 minutes.  Critical care was necessary to treat or prevent imminent or life-threatening deterioration.   PATIENT WITH VERY POOR PROGNOSIS I ANTICIPATE PROLONGED ICU  LOS  Patient with Multiorgan failure and at high risk for cardiac arrest and death.    Corrin Parker, M.D.  [facility]  Medical Director [place] Director [facility] Cardio-Pulmonary Department

## 2021-12-23 NOTE — ED Notes (Signed)
Dr. Joni Fears notified of BP of 51/30. NS bolus started.

## 2021-12-23 NOTE — Progress Notes (Signed)
Initial Nutrition Assessment  DOCUMENTATION CODES:   Not applicable  INTERVENTION:   Vital 1.2'@55ml'$ /hr- Initiate at 75m/hr and advance by 165mhr q 8 hours until goal rate is reached.   Free water flushes 3041m4 hours to maintain tube patency   Regimen provides 1584kcal/day, 99g/day protein and 1250m46my of free water.   Pt at high refeed risk; recommend monitor potassium, magnesium and phosphorus labs daily until stable  Daily weights   Folic acid 1.0 mg daily via tube   Recommend IV iron supplementation once pt is more stable.   NUTRITION DIAGNOSIS:   Inadequate oral intake related to inability to eat (pt sedated and ventilated) as evidenced by NPO status.  GOAL:   Provide needs based on ASPEN/SCCM guidelines  MONITOR:   Vent status, Labs, Weight trends, TF tolerance, Skin, I & O's  REASON FOR ASSESSMENT:   Ventilator    ASSESSMENT:   49 y40. female with medical history significant for diverticulitis s/p colostomy, incisional hernia s/p repair, GERD, HTN, substance abuse, chronic hypoxic hypercapnic respiratory failure, severe COPD, chronic diastolic heart failure, chronic tobacco abuse and generalized anxiety disorder who is admitted with end stage COPD with severe aortic stenosis and acute cardiac failure.  Pt sedated and ventilated OGT in place. Plan is to start tube feeds today. Pt is at high refeed risk. Per chart, pt appears to have weight loss pta. RD unsure if pt's bed weights are correct as they are significantly lower than her most recent documented weight. Of note, pt with folate and iron deficiency; will supplement folic acid enterally and then IV iron once pt is more stable.   Medications reviewed and include: colace, pepcid, heparin, insulin, solu-medrol, miralax  Labs reviewed: K 4.2 wnl, P 2.3(L), Mg 1.9 wnl Iron 23(L), TIBC 441, ferritin 9(L), folate 5.5(L), B12 256 Hgb 9.1(L), Hct 31.9(L) Cbgs- 118, 96, 104, 92 x 24 hrs  Patient is currently  intubated on ventilator support MV: 8.3 L/min Temp (24hrs), Avg:99.4 F (37.4 C), Min:97.8 F (36.6 C), Max:100.2 F (37.9 C)  Propofol: none   MAP >65mm109m UOP- 105ml 36mTRITION - FOCUSED PHYSICAL EXAM:  Flowsheet Row Most Recent Value  Orbital Region No depletion  Upper Arm Region Moderate depletion  Thoracic and Lumbar Region No depletion  Buccal Region No depletion  Temple Region Mild depletion  Clavicle Bone Region No depletion  Clavicle and Acromion Bone Region No depletion  Scapular Bone Region No depletion  Dorsal Hand Unable to assess  Patellar Region Moderate depletion  Anterior Thigh Region Moderate depletion  Posterior Calf Region Moderate depletion  Edema (RD Assessment) None  Hair Reviewed  Eyes Reviewed  Mouth Reviewed  Skin Reviewed  Nails Reviewed   Diet Order:   Diet Order             Diet NPO time specified  Diet effective now                  EDUCATION NEEDS:   No education needs have been identified at this time  Skin:  Skin Assessment: Reviewed RN Assessment  Last BM:  12/6- via ostomy  Height:   Ht Readings from Last 1 Encounters:  12/23/21 5' (1.524 m)    Weight:   Wt Readings from Last 1 Encounters:  12/23/21 41 kg    Ideal Body Weight:  45.45 kg  BMI:  Body mass index is 17.65 kg/m.  Estimated Nutritional Needs:   Kcal:  1464kcal/day  Protein:  90-100g/day  Fluid:  1.4-1.6L/day  Koleen Distance MS, RD, LDN Please refer to [facility] [place] for RD and/or RD on-call/weekend/after hours pager

## 2021-12-23 NOTE — ED Notes (Signed)
Versed drip restarted at 2 mg/hr and 1 mg versed bolus given at this time. Domingo Pulse at bedside.

## 2021-12-23 NOTE — ED Notes (Signed)
Report given to [facility], CCU RN.

## 2021-12-23 NOTE — ED Notes (Signed)
NS bolus stopped. Patient received approximately 600 ml. Misty Green at bedside with husband.

## 2021-12-24 DIAGNOSIS — I959 Hypotension, unspecified: Secondary | ICD-10-CM

## 2021-12-24 DIAGNOSIS — J9621 Acute and chronic respiratory failure with hypoxia: Secondary | ICD-10-CM | POA: Diagnosis not present

## 2021-12-24 DIAGNOSIS — J9622 Acute and chronic respiratory failure with hypercapnia: Secondary | ICD-10-CM | POA: Diagnosis not present

## 2021-12-24 DIAGNOSIS — I350 Nonrheumatic aortic (valve) stenosis: Secondary | ICD-10-CM

## 2021-12-24 DIAGNOSIS — J441 Chronic obstructive pulmonary disease with (acute) exacerbation: Secondary | ICD-10-CM

## 2021-12-24 DIAGNOSIS — Z7189 Other specified counseling: Secondary | ICD-10-CM | POA: Diagnosis not present

## 2021-12-24 LAB — RESPIRATORY PANEL BY PCR

## 2021-12-24 LAB — CBC
HCT: 30.1 % — ABNORMAL LOW (ref 36.0–46.0)
Hemoglobin: 8.7 g/dL — ABNORMAL LOW (ref 12.0–15.0)
MCH: 29 pg (ref 26.0–34.0)
MCHC: 28.9 g/dL — ABNORMAL LOW (ref 30.0–36.0)
MCV: 100.3 fL — ABNORMAL HIGH (ref 80.0–100.0)
Platelets: 202 10*3/uL (ref 150–400)
RBC: 3 MIL/uL — ABNORMAL LOW (ref 3.87–5.11)
RDW: 13.9 % (ref 11.5–15.5)
WBC: 9.3 10*3/uL (ref 4.0–10.5)
nRBC: 0 % (ref 0.0–0.2)

## 2021-12-24 LAB — BLOOD GAS, VENOUS
Acid-Base Excess: 22.1 mmol/L — ABNORMAL HIGH (ref 0.0–2.0)
Bicarbonate: 52.6 mmol/L — ABNORMAL HIGH (ref 20.0–28.0)
O2 Saturation: 76.2 %
Patient temperature: 37
pCO2, Ven: 89 mmHg (ref 44–60)
pH, Ven: 7.38 (ref 7.25–7.43)
pO2, Ven: 49 mmHg — ABNORMAL HIGH (ref 32–45)

## 2021-12-24 LAB — MAGNESIUM: Magnesium: 2.1 mg/dL (ref 1.7–2.4)

## 2021-12-24 LAB — GLUCOSE, CAPILLARY
Glucose-Capillary: 113 mg/dL — ABNORMAL HIGH (ref 70–99)
Glucose-Capillary: 122 mg/dL — ABNORMAL HIGH (ref 70–99)
Glucose-Capillary: 130 mg/dL — ABNORMAL HIGH (ref 70–99)
Glucose-Capillary: 136 mg/dL — ABNORMAL HIGH (ref 70–99)
Glucose-Capillary: 136 mg/dL — ABNORMAL HIGH (ref 70–99)
Glucose-Capillary: 149 mg/dL — ABNORMAL HIGH (ref 70–99)

## 2021-12-24 LAB — BASIC METABOLIC PANEL
Anion gap: 6 (ref 5–15)
BUN: 25 mg/dL — ABNORMAL HIGH (ref 6–20)
CO2: 43 mmol/L — ABNORMAL HIGH (ref 22–32)
Calcium: 8.6 mg/dL — ABNORMAL LOW (ref 8.9–10.3)
Chloride: 91 mmol/L — ABNORMAL LOW (ref 98–111)
Creatinine, Ser: 0.52 mg/dL (ref 0.44–1.00)
GFR, Estimated: >60 mL/min (ref >60)
Glucose, Bld: 128 mg/dL — ABNORMAL HIGH (ref 70–99)
Potassium: 3.4 mmol/L — ABNORMAL LOW (ref 3.5–5.1)
Sodium: 140 mmol/L (ref 135–145)

## 2021-12-24 LAB — URINE CULTURE: Culture: NO GROWTH

## 2021-12-24 LAB — PHOSPHORUS: Phosphorus: 4 mg/dL (ref 2.5–4.6)

## 2021-12-24 LAB — PROCALCITONIN: Procalcitonin: <0.1 ng/mL

## 2021-12-24 MED ORDER — POTASSIUM CHLORIDE 20 MEQ PO PACK
40.0000 meq | PACK | Freq: Once | ORAL | Status: AC
  Administered 2021-12-24: 40 meq
  Filled 2021-12-24: qty 2

## 2021-12-24 NOTE — Progress Notes (Signed)
[facility] RN NOTE  PATIENT NAME: Misty Green DOB: March 07, 1970 MRN: [id number]  PRIMARY CARE PROVIDER: [name], MD  RESPONSIBLE PARTY: Sidonie Dickens (spouse) Acct ID - Guarantor Home Phone Work Phone Relationship Acct Type  [id number] Halimah Bewick,* [phone]  Self P/F     [address], [place], [place] [address]    RN/SW team completed initial palliative care consult visit in her home. Husband present for first part of visit, but had to leave early for an appointment.   Diagnosis: End stage COPD. Upon arrival she is sitting on the couch. She began talking about feeling hot, and the air conditioner and ceiling fan were both on. Reports having changes in temperature often. Shortness of breath present during conversation and exertion. Halfway through visit she reports having difficulty breathing. I recommended she do a nebulizer treatment and she did. Breathing was more even and regular afterwards. After breathing treatments no wheezing or adventitious lung sounds auscultated. Initially O2 level was 84-85% on 3L at finish of treatment. Within 3 minutes it came up to 94-95%. She reports that her O2 levels do drop down to the 60s with exertion. Afterwards usually comes up to 88-92% range. She will temporarily increase her O2 to 3 1/2 to 4L with exertion, then drop it back down at rest. She has a Trilogy machine she uses mainly at night, but sometimes during the day. At night she increases her O2 from 3L to 4L. Nonproductive cough.She says she was told her prognosis is 3-4 months. Asked if she had been educated about hospice. She says she was told she wasn't quite ready for this yet. Explained that hospice eligibility is a prognosis of 6 months or less. She still reverts back to saying she was told she is not ready and she feels she has longer to live than this.  Cognitive: Becoming increasingly more forgetful. Very pleasant and engaging.  Pain: Denies pain  or discomfort.  Mobility: Ambulatory. Has a rollator walker but does not utilize it. Knows her gait is unsteady. Requires assistance with bathing and dressing. Requires frequent rest periods.   Goals of care: She is currently a Full code. She wants to live as long as possible and remain in her home. She is agreeable to future visits by palliative care team.     CODE STATUS: Full code ADVANCED DIRECTIVES: N MOST FORM: no PPS: 50%   PHYSICAL EXAM:   VITALS: Today's Vitals   01/17/2022 1323  BP: 111/74  Pulse: 72  Resp: (!) 24  Temp: 98 F (36.7 C)  TempSrc: Temporal  SpO2: 94%  PainSc: 0-No pain      Daryl Eastern, RN BSN

## 2021-12-24 NOTE — IPAL (Signed)
  Interdisciplinary Goals of Care Family Meeting   Date carried out: 12/24/2021  Location of the meeting: Bedside  Member's involved: Physician, Bedside Registered Nurse, and Family Member or next of kin       GOALS OF CARE DISCUSSION  The Clinical status was relayed to family in detail- Daughter and Husband   Updated and notified of patients medical condition- Patient remains unresponsive and will not open eyes to command.   Patient is having a weak cough and struggling to remove secretions.   Patient with increased WOB and using accessory muscles to breathe Explained to family course of therapy and the modalities   Patient with Progressive multiorgan failure with a very high probablity of a very minimal chance of meaningful recovery despite all aggressive and optimal medical therapy.   END STAGE COPD SEVERE AS SEVERE RESP FAILURE PLAN FOR WAKE UP ASSESSMENT START PRECEDEX Prognosis is grave   Family understands the situation.  They have consented and agreed to DNR STATUS PATIENT WOULD NOT WANT TRACH AND PEG TUBE  Family are satisfied with Plan of action and management. All questions answered  Additional CC time 35 mins   Misty Green, M.D.  [facility]  Medical Director [place] Director [facility] Cardio-Pulmonary Department

## 2021-12-24 NOTE — Progress Notes (Signed)
Attempted to wean sedation per md with family at bedside. Pt became very animated. Heart rate escalated to 90's. RR increased to 40 bpm. Physician notified and sedation turned back on

## 2021-12-24 NOTE — Progress Notes (Signed)
Daily Progress Note   Patient Name: Misty Green       Date: 12/24/2021 DOB: 08/27/1970  Age: 51 y.o. MRN#: [id number] Attending Physician: [name], MD Primary Care Physician: [name], MD Admit Date: 01/17/2022  Reason for Consultation/Follow-up: Establishing goals of care  Subjective: Notes and labs reviewed. In to see patient, she is currently on the ventilator with wake up assessment in progress. Patient's husband, son and daughter are at bedside. Daughter is a Audiological scientist for [employer].   Patient became agitated, with desaturation and diaphoresis. She was attempting to sit up in bed. She would not follow commands. Sedation resumed. Met with family in meeting room.    We discussed her diagnosis, prognosis, GOC, EOL wishes disposition and options.  Created space and opportunity for patient  to explore thoughts and feelings regarding current medical information.   A detailed discussion was had today regarding advanced directives.  Concepts specific to code status, artifical feeding and hydration, IV antibiotics and rehospitalization were discussed.  The difference between an aggressive medical intervention path and a comfort care path was discussed.  Values and goals of care important to patient and family were attempted to be elicited.  Discussed limitations of medical interventions to prolong quality of life in some situations and discussed the concept of human mortality. We discussed autonomy. They state she would not have wanted to be placed on the ventilator this admission, and would not want to suffer.   The family is clear she would not want trach and PEG. We discussed one way extubation and care moving forward. Great amount of time discussing home with  hospice, hospice facility, and hospital death. Questions answered. Discussed family dynamics. Plans for shift to comfort care. Total of 2 hours spent with family.   I completed a MOST form today and the signed original was placed in the chart. The form was scanned and sent to medical records for it to be uploaded under ACP tab in Epic. A photocopy was also placed in the chart to be scanned into EMR. The patient outlined their wishes for the following treatment decisions:  Cardiopulmonary Resuscitation: Do Not Attempt Resuscitation (DNR/No CPR)  Medical Interventions: Full Scope of Treatment: Use intubation, advanced airway interventions, mechanical ventilation, cardioversion as indicated, medical treatment, IV fluids, etc, also provide comfort measures.  Transfer to the hospital if indicated No escalation in care.   Antibiotics: Antibiotics if indicated  IV Fluids: IV fluids if indicated  Feeding Tube: Left Blank       Length of Stay: 2  Current Medications: Scheduled Meds:  . budesonide (PULMICORT) nebulizer solution  0.5 mg Nebulization BID  . Chlorhexidine Gluconate Cloth  6 each Topical Q0600  . docusate  100 mg Per Tube BID  . escitalopram  20 mg Per Tube Daily  . famotidine  20 mg Per Tube BID  . folic acid  1 mg Per Tube Daily  . free water  30 mL Per Tube Q4H  . heparin  5,000 Units Subcutaneous Q8H  . insulin aspart  0-20 Units Subcutaneous Q4H  . ipratropium-albuterol  3 mL Nebulization Q4H  . methylPREDNISolone (SOLU-MEDROL) injection  20 mg Intravenous Q12H  . mouth rinse  15 mL Mouth Rinse Q2H  . polyethylene glycol  17 g Per Tube Daily  . QUEtiapine  50 mg Per Tube QHS    Continuous Infusions: . sodium chloride    . dexmedetomidine (PRECEDEX) IV infusion 1 mcg/kg/hr (12/24/21 0908)  . feeding supplement (VITAL AF 1.2 CAL) 55 mL/hr at 12/24/21 0700  . fentaNYL infusion INTRAVENOUS 200 mcg/hr (12/24/21 1204)  . midazolam 2 mg/hr (12/24/21 1204)  . norepinephrine  (LEVOPHED) Adult infusion 2 mcg/min (12/24/21 0700)    PRN Meds: docusate sodium, fentaNYL, fentaNYL (SUBLIMAZE) injection, midazolam, mouth rinse, polyethylene glycol, vecuronium  Physical Exam Constitutional:      Comments: Eyes closed. On ventilator.              Vital Signs: BP 119/74   Pulse 75   Temp 98.1 F (36.7 C)   Resp (!) 21   Ht 5' (1.524 m)   Wt 41.1 kg   SpO2 94%   BMI 17.70 kg/m  SpO2: SpO2: 94 % O2 Device: O2 Device: Ventilator O2 Flow Rate:    Intake/output summary:  Intake/Output Summary (Last 24 hours) at 12/24/2021 1250 Last data filed at 12/24/2021 0700 Gross per 24 hour  Intake 1274.63 ml  Output 300 ml  Net 974.63 ml   LBM: Last BM Date : 12/24/21 Baseline Weight: Weight: 41 kg Most recent weight: Weight: 41.1 kg        Patient Active Problem List   Diagnosis Date Noted  . Acute on chronic respiratory failure ([place]) 01/12/2022  . Abdominal pain 10/17/2021  . Acute on chronic respiratory failure with hypoxia ([place]) 10/16/2021  . Colostomy status s/p partial colectomy for recurrent diverticulitis ([place]) 10/16/2021  . Unintentional weight loss 06/21/2021  . Abdominal tenderness 06/21/2021  . Nausea & vomiting 06/21/2021  . History of diverticulitis 06/21/2021  . History of repair of inguinal hernia 06/21/2021  . Elevated troponin 06/21/2021  . Acute on chronic respiratory failure with hypoxemia ([place]) 06/20/2021  . CAP (community acquired pneumonia) 06/20/2021  . Hypokalemia 06/20/2021  . Acute on chronic diastolic CHF (congestive heart failure) ([place]) 01/22/2020  . Hypercapnic respiratory failure, chronic ([place]) 07/08/2019  . Chronic fatigue 05/16/2019  . SOB (shortness of breath) 05/16/2019  . Seasonal allergic reaction 05/16/2019  . Anxiety disorder due to multiple medical problems 05/16/2019  . Edema 05/16/2019  . Encounter for screening mammogram for malignant neoplasm of breast 05/16/2019  . OSA (obstructive sleep apnea)   .  Noncompliance with CPAP treatment   . Chronic obstructive pulmonary disease ([place])   . Chest wall pain   . Essential hypertension   . Steroid-induced  hyperglycemia   . AKI (acute kidney injury) ([place])   . Supplemental oxygen dependent   . Generalized anxiety disorder   . Hypoxic encephalopathy ([place]) 05/27/2018  . Anoxic brain injury ([place])   . ARF (acute renal failure) ([place])   . Lactic acidosis   . Transaminitis   . Acute on chronic respiratory failure with hypercapnia ([place])   . Cardiac arrest ([place]) 05/12/2018  . Diverticulitis of colon (without mention of hemorrhage)(562.11)   . Acute diverticulitis 01/06/2018  . Mixed anxiety and depressive disorder 12/22/2017  . Obesity 12/22/2017  . Tobacco user 12/22/2017  . Diverticulitis 12/03/2017  . Spinal stenosis of lumbar region 04/12/2016  . Acute respiratory failure ([place]) 03/22/2016  . COPD exacerbation ([place]) 03/22/2016  . Chronic pain syndrome 03/22/2016  . Opioid type dependence, abuse ([place]) 03/22/2016  . Polycythemia 03/22/2016  . Anxiety 03/22/2016  . OSA on CPAP 03/22/2016  . Closed fracture of distal end of right radius 08/25/2015  . Closed nondisplaced fracture of styloid process of right ulna 08/25/2015    Palliative Care Assessment & Plan     Recommendations/Plan: Shift to comfort are tomorrow at 9:00  Code Status:    Code Status Orders  (From admission, onward)           Start     Ordered   12/24/21 0912  Do not attempt resuscitation (DNR)  Continuous       Question Answer Comment  In the event of cardiac or respiratory ARREST Do not call a "code blue"   In the event of cardiac or respiratory ARREST Do not perform Intubation, CPR, defibrillation or ACLS   In the event of cardiac or respiratory ARREST Use medication by any route, position, wound care, and other measures to relive pain and suffering. May use oxygen, suction and manual treatment of airway obstruction as needed for comfort.      12/24/21 0911            Code Status History     Date Active Date Inactive Code Status Order ID Comments User Context   12/21/2021 2358 12/24/2021 0911 Full Code [id number]  Rust-Chester, Huel Cote, NP ED   10/16/2021 2019 10/19/2021 1734 Full Code [id number]  Athena Masse, MD ED   06/21/2021 0112 06/25/2021 0001 Full Code [id number]  Elwyn Reach, MD Inpatient   01/31/2020 0819 02/11/2020 2225 Partial Code [id number]  [name], MD Inpatient   01/22/2020 1623 01/31/2020 0818 Full Code [id number]  Howerter, Ethelda Chick, DO ED   07/08/2019 1658 07/12/2019 1736 Full Code [id number]  Omar Person, NP ED   05/27/2018 1455 06/05/2018 2002 Full Code [id number]  Bary Leriche, PA-C Inpatient   05/14/2018 1534 05/27/2018 1447 Full Code [id number]  Alphonzo Grieve, MD Inpatient   05/13/2018 0149 05/14/2018 1534 Partial Code [id number]  Kandice Hams, MD Inpatient   05/12/2018 2344 05/13/2018 0149 Full Code [id number]  Kandice Hams, MD ED   01/06/2018 2039 01/18/2018 2136 Full Code [id number]  Dustin Flock, MD Inpatient   12/03/2017 1840 12/13/2017 1740 Full Code [id number]  Henreitta Leber, MD Inpatient   03/22/2016 0059 03/24/2016 1705 Full Code [id number]  Norval Morton, MD ED      Care plan was discussed with RN and CCM  Thank you for allowing the Palliative Medicine Team to assist in the care of this patient.  Asencion Gowda, NP  Please contact Palliative Medicine Team phone at [phone] for questions and concerns.

## 2021-12-24 NOTE — Consult Note (Signed)
Advanced Heart Failure Team Consult Note   Primary Physician: [name], MD PCP-Cardiologist:  None  Reason for Consultation: Aortic stenosis respiratory failure  HPI:    Misty Green is seen today for evaluation of AS and respiratory failure at the request of Dr. Mortimer Fries.   Misty Green is a 51 y/o female with severe COPD on home O2 (quit 2021), cardiac arrest 4/20, anxiety, chronic pain syndrome, sleep apnea, h/o diverticulitis a/p partial clectomy 2018 (has colostomy bag), moderate aortic stenosis and chronic diastolic heart failure    Had cardiac arrest 5/20 with CPR. Developed renal failure. Echo 60-65%. Felt to be primarily respiratory arrest and prescribed BiPAP.    Admitted 10/16/21 due to shortness of breath due to COPD exacerbation and minimal HF.Initially needed bipap with heated high flow but then weaned down to baseline O2 @ 3L.IV solu-medrol given and transitioned to oral prednisone. IV lasix given and then changed to oral diuretics. Discharged after 3 days.    Echo 10/17/21 reviewed and showed an EF of 55-60% along with moderate LVH, mild/moderate LAE, mild RAE and mild MR/AR and severe AS. I saw her recently in Clinic and reviewed echo and felt AS was moderate -> mean gradient 24 AVA 1.1cm2  She was very symptomatic with ongoing weight loss. Felt to be due to end-stage COPD. Eventually referred to Hospice    Patient developed severe respiratory distress at home. EMS called. Upon EMS arrival, patient initially alert with SpO2 in the 60's then became unresponsive in the ambulance requiring bag valve mask support. Upon arrival to ED patient was unresponsive with EMS performing BVM support.  She was emergently intubated and provided mechanical ventilatory support  Remains on vent. PCT < 0.10, BNP 141, hstrop 7-> 10  ABG 7.18/>120/88. CXR no edema   On NE 2. Unresponsive.    Review of Systems: Patient intubated. Not available.    Home Medications Prior to  Admission medications   Medication Sig Start Date End Date Taking? Authorizing Provider  buPROPion (WELLBUTRIN) 75 MG tablet Take 75 mg by mouth 2 (two) times daily.   Yes [provider]  escitalopram (LEXAPRO) 20 MG tablet One '20mg'$  tablet po daily 06/24/21  Yes Wieting, Richard, MD  furosemide (LASIX) 40 MG tablet Take 40 mg by mouth daily. 01/20/20  Yes [provider]  gabapentin (NEURONTIN) 400 MG capsule Take 1 capsule (400 mg total) by mouth 3 (three) times daily. 07/12/19  Yes Mercy Riding, MD  Melatonin 10 MG TABS Take 20 mg by mouth at bedtime.   Yes [provider]  Multiple Vitamins-Minerals (MULTIVITAMIN WITH MINERALS) tablet Take 1 tablet by mouth at bedtime.   Yes [provider]  polyethylene glycol (MIRALAX / GLYCOLAX) 17 g packet Take 17 g by mouth daily. 02/12/20  Yes Eugenie Filler, MD  potassium chloride (KLOR-CON M) 20 MEQ tablet Take 1 tablet (20 mEq total) by mouth daily. 06/24/21  Yes [name], MD  PRESCRIPTION MEDICATION Inhale into the lungs at bedtime. BiPap   Yes [provider]  QUEtiapine (SEROQUEL) 50 MG tablet Take 50 mg by mouth at bedtime. 09/23/21  Yes [provider]  Grant Ruts INHUB 500-50 MCG/ACT AEPB Inhale 1 puff into the lungs every 12 (twelve) hours. 09/28/21  Yes [provider]  acetaminophen (TYLENOL) 325 MG tablet Take 1-2 tablets (325-650 mg total) by mouth every 4 (four) hours as needed for mild pain. Patient taking differently: Take 650 mg by mouth every 6 (six)  hours as needed for mild pain. 06/05/18   Love, Ivan Anchors, PA-C  albuterol (PROVENTIL) (2.5 MG/3ML) 0.083% nebulizer solution Take 2.5 mg by nebulization every 6 (six) hours as needed for wheezing or shortness of breath. Inhale one vial via nebulizer 4 times a day as directed.    [provider]  albuterol (VENTOLIN HFA) 108 (90 Base) MCG/ACT inhaler Inhale 2 puffs into the lungs every 6 (six) hours as needed for wheezing or  shortness of breath. 10/19/21   Fritzi Mandes, MD    Past Medical History: Past Medical History:  Diagnosis Date   Cardiac arrest [facility]) 05/12/2018   CHF (congestive heart failure) (HCC)    Chicken pox    COPD (chronic obstructive pulmonary disease) (HCC)    Current smoker    Dyspnea    GERD (gastroesophageal reflux disease)    Hypertension    Malfunction of colostomy and enterostomy (HCC)    Opiate abuse, continuous ([place])    Oxygen deficit    Sleep apnea     Past Surgical History: Past Surgical History:  Procedure Laterality Date   ABDOMINAL HYSTERECTOMY     BACK SURGERY     cesction     COLONOSCOPY WITH PROPOFOL N/A 06/24/2021   Procedure: COLONOSCOPY WITH PROPOFOL;  Surgeon: [name], DO;  Location: [facility] ENDOSCOPY;  Service: Gastroenterology;  Laterality: N/A;  through stoma   COLOSTOMY  01/13/2018   Procedure: COLOSTOMY Creation;  Surgeon: Jules Husbands, MD;  Location: [facility] ORS;  Service: General;;   ESOPHAGOGASTRODUODENOSCOPY (EGD) WITH PROPOFOL N/A 06/23/2021   Procedure: ESOPHAGOGASTRODUODENOSCOPY (EGD) WITH PROPOFOL;  Surgeon: [name], DO;  Location: [facility];  Service: Gastroenterology;  Laterality: N/A;   HERNIA REPAIR     INCISIONAL HERNIA REPAIR     lower midline laparotomy incision (hysterectomy), repaired with mesh   IR FLUORO GUIDE CV LINE RIGHT  05/26/2018   IR REMOVAL TUN CV CATH W/O FL  06/02/2018   IR US GUIDE VASC ACCESS RIGHT  05/26/2018   LAPAROSCOPIC CHOLECYSTECTOMY  2012   LAPAROSCOPIC LYSIS OF ADHESIONS  01/13/2018   Procedure: LAPAROSCOPIC LYSIS OF ADHESIONS;  Surgeon: Jules Husbands, MD;  Location: [facility] ORS;  Service: General;;   LAPAROSCOPIC SIGMOID COLECTOMY  01/13/2018   Procedure: LAPAROSCOPIC SIGMOID COLECTOMY;  Surgeon: Jules Husbands, MD;  Location: [facility] ORS;  Service: General;;   LUMBAR SPINE SURGERY     ORIF WRIST FRACTURE Right 08/25/2015   Procedure: OPEN REDUCTION INTERNAL FIXATION (ORIF) WRIST FRACTURE;   Surgeon: Corky Mull, MD;  Location: [facility] ORS;  Service: Orthopedics;  Laterality: Right;   OVARIAN CYST REMOVAL      Family History: Family History  Problem Relation Age of Onset   Diabetes Mother    COPD Mother    Hypertension Father    Diabetes Father    Heart disease Father    Hyperlipidemia Father    Prostate cancer Father     Social History: Social History   Socioeconomic History   Marital status: Married    Spouse name: Not on file   Number of children: Not on file   Years of education: Not on file   Highest education level: Not on file  Occupational History   Not on file  Tobacco Use   Smoking status: Former    Packs/day: 1.00    Years: 15.00    Total pack years: 15.00    Types: Cigarettes   Smokeless tobacco: Never   Tobacco comments:  4 cigarettes a day   Vaping Use   Vaping Use: Former   Substances: Nicotine  Substance and Sexual Activity   Alcohol use: Yes    Comment: rarely   Drug use: Yes    Types: Marijuana    Comment: not everyday    Sexual activity: Not on file  Other Topics Concern   Not on file  Social History Narrative   Not on file   Social Determinants of Health   Financial Resource Strain: Not on file  Food Insecurity: No Food Insecurity (10/17/2021)   Hunger Vital Sign    Worried About Running Out of Food in the Last Year: Never true    Ran Out of Food in the Last Year: Never true  Transportation Needs: No Transportation Needs (10/17/2021)   PRAPARE - Hydrologist (Medical): No    Lack of Transportation (Non-Medical): No  Physical Activity: Not on file  Stress: Not on file  Social Connections: Not on file    Allergies:  Allergies  Allergen Reactions   Ativan [Lorazepam] Other (See Comments)    D/w with dter Aniceto Boss pt gets very combative and agitated. Has tolerated Versed well   Other Nausea And Vomiting    Anti-depressent that starts with t    Objective:    Vital Signs:   Temp:  [98.1 F  (36.7 C)-100 F (37.8 C)] 98.1 F (36.7 C) (12/07 1100) Pulse Rate:  [52-75] 75 (12/07 1100) Resp:  [14-21] 21 (12/07 1100) BP: (85-124)/(46-74) 119/74 (12/07 1100) SpO2:  [87 %-98 %] 94 % (12/07 1236) FiO2 (%):  [40 %] 40 % (12/07 1236) Weight:  [41.1 kg] 41.1 kg (12/07 0500) Last BM Date : 12/24/21  Weight change: Filed Weights   12/23/21 0339 12/24/21 0500  Weight: 41 kg 41.1 kg    Intake/Output:   Intake/Output Summary (Last 24 hours) at 12/24/2021 1317 Last data filed at 12/24/2021 0700 Gross per 24 hour  Intake 1274.63 ml  Output 240 ml  Net 1034.63 ml      Physical Exam    General:  Chronically ill appearing. Cachetic Unresponsive on vent  HEENT: normal + ETT Neck: supple. LIJ TLC   Cor: PMI nondisplaced. Regular rate & rhythm. 2/6 AS S2 moderately decreased but not obliterated Lungs: coarse Abdomen: soft, nontender, nondistended. No hepatosplenomegaly. No bruits or masses. Good bowel sounds. + ostomy Extremities: no cyanosis, clubbing, rash, edema  Neuro: sedated on vent    Telemetry   Sinus 70-80s Personally reviewed  Labs   Basic Metabolic Panel: Recent Labs  Lab 12/21/2021 2313 12/23/21 0454 12/24/21 0456  NA 141 140 140  K 4.0 4.2 3.4*  CL 88* 89* 91*  CO2 >45* 44* 43*  GLUCOSE 114* 97 128*  BUN 15 16 25*  CREATININE 0.47 0.48 0.52  CALCIUM 9.0 8.7* 8.6*  MG 1.9 1.9 2.1  PHOS 4.6 2.3* 4.0    Liver Function Tests: Recent Labs  Lab 01/14/2022 2313  AST 20  ALT 14  ALKPHOS 47  BILITOT 0.7  PROT 6.7  ALBUMIN 3.3*   No results for input(s): "LIPASE", "AMYLASE" in the last 168 hours. No results for input(s): "AMMONIA" in the last 168 hours.  CBC: Recent Labs  Lab 12/29/2021 2313 12/23/21 0701 12/24/21 0456  WBC 7.0 7.2 9.3  HGB 9.8* 9.1* 8.7*  HCT 34.9* 31.9* 30.1*  MCV 105.1* 101.3* 100.3*  PLT 206 198 202    Cardiac Enzymes: No results for input(s): "CKTOTAL", "CKMB", "  CKMBINDEX", "TROPONINI" in the last 168  hours.  BNP: BNP (last 3 results) Recent Labs    06/20/21 1756 10/16/21 1730 01/02/2022 2313  BNP 193.5* 79.9 140.7*    ProBNP (last 3 results) No results for input(s): "PROBNP" in the last 8760 hours.   CBG: Recent Labs  Lab 12/23/21 1951 12/23/21 2358 12/24/21 0404 12/24/21 0735 12/24/21 1124  GLUCAP 123* 134* 113* 122* 130*    Coagulation Studies: No results for input(s): "LABPROT", "INR" in the last 72 hours.   Imaging   DG Chest Port 1 View  Result Date: 12/23/2021 CLINICAL DATA:  Central line placement EXAM: PORTABLE CHEST 1 VIEW COMPARISON:  12/26/2021 FINDINGS: Endotracheal tube tip 2.9 cm above the carina. New left IJ line tip: SVC. Nasogastric tube enters the stomach. No pneumothorax. The patient is rotated to the right on today's radiograph, reducing diagnostic sensitivity and specificity. Airway thickening is present, suggesting bronchitis or reactive airways disease. No discrete airspace opacity is identified. No blunting of the costophrenic angles, with the left costophrenic angle slightly excluded. Old granulomatous disease with small calcified granuloma seen in the left upper lobe. Atherosclerotic calcification of the aortic arch. IMPRESSION: 1. New left IJ line tip: SVC. No pneumothorax. 2. Airway thickening is present, suggesting bronchitis or reactive airways disease. 3. Old granulomatous disease. 4. Endotracheal and nasogastric tubes appear satisfactorily positioned. 5.  Aortic Atherosclerosis (ICD10-I70.0). Electronically Signed   By: Van Clines M.D.   On: 12/23/2021 19:21   DG Abd 1 View  Result Date: 12/23/2021 CLINICAL DATA:  Orogastric tube placement. EXAM: ABDOMEN - 1 VIEW COMPARISON:  December 22, 2021. FINDINGS: Distal portion of nasogastric tube appears to be within the proximal stomach, having been mildly advanced since prior exam. IMPRESSION: Distal portion of nasogastric tube appears to be within the proximal stomach. Electronically Signed    By: Marijo Conception M.D.   On: 12/23/2021 13:52     Medications:     Current Medications:  budesonide (PULMICORT) nebulizer solution  0.5 mg Nebulization BID   Chlorhexidine Gluconate Cloth  6 each Topical Q0600   docusate  100 mg Per Tube BID   escitalopram  20 mg Per Tube Daily   famotidine  20 mg Per Tube BID   folic acid  1 mg Per Tube Daily   free water  30 mL Per Tube Q4H   heparin  5,000 Units Subcutaneous Q8H   insulin aspart  0-20 Units Subcutaneous Q4H   ipratropium-albuterol  3 mL Nebulization Q4H   methylPREDNISolone (SOLU-MEDROL) injection  20 mg Intravenous Q12H   mouth rinse  15 mL Mouth Rinse Q2H   polyethylene glycol  17 g Per Tube Daily   QUEtiapine  50 mg Per Tube QHS    Infusions:  sodium chloride     dexmedetomidine (PRECEDEX) IV infusion 1 mcg/kg/hr (12/24/21 0908)   feeding supplement (VITAL AF 1.2 CAL) 55 mL/hr at 12/24/21 0700   fentaNYL infusion INTRAVENOUS 200 mcg/hr (12/24/21 1204)   midazolam 2 mg/hr (12/24/21 1204)   norepinephrine (LEVOPHED) Adult infusion 2 mcg/min (12/24/21 0700)      Assessment/Plan   1. Acute on chronic hypoxic/hypercapnic respiratory failure on top of end-stage COPD - decompensation likely related to AECOPD and not HF - management as per CCM - Hospice & Palliative Care involved  2. Moderate AS - this is not severe/critical - likely not playing a major role in her presentation - not candidatte for TAVR  3. Hypotension - suspect due to sedation  -  continue NE as needed  Case d/w Dr. Mortimer Fries. She has end-stage COPD and plan is for one-way extubation with comfort care tomorrow. Agree with plan. I d/w her father at the bedside as well.   CRITICAL CARE Performed by: Glori Bickers  Total critical care time: 45 minutes  Critical care time was exclusive of separately billable procedures and treating other patients.  Critical care was necessary to treat or prevent imminent or life-threatening  deterioration.  Critical care was time spent personally by me (independent of midlevel providers or residents) on the following activities: development of treatment plan with patient and/or surrogate as well as nursing, discussions with consultants, evaluation of patient's response to treatment, examination of patient, obtaining history from patient or surrogate, ordering and performing treatments and interventions, ordering and review of laboratory studies, ordering and review of radiographic studies, pulse oximetry and re-evaluation of patient's condition.    Length of Stay: 2  Glori Bickers, MD  12/24/2021, 1:17 PM  Advanced Heart Failure Team Pager [phone] (M-F; 7a - 5p)  Please contact [facility] for night-coverage after hours (4p -7a ) and weekends on [email or web address]

## 2021-12-24 NOTE — Progress Notes (Signed)
PHARMACY CONSULT NOTE   Pharmacy Consult for Electrolyte Monitoring and Replacement   Recent Labs: Potassium (mmol/L)  Date Value  12/24/2021 3.4 (L)   Magnesium (mg/dL)  Date Value  12/24/2021 2.1   Calcium (mg/dL)  Date Value  12/24/2021 8.6 (L)   Albumin (g/dL)  Date Value  01/05/2022 3.3 (L)   Phosphorus (mg/dL)  Date Value  12/24/2021 4.0   Sodium (mmol/L)  Date Value  12/24/2021 140    Assessment: 51 y.o. female with medical history significant for diverticulitis s/p colostomy, incisional hernia s/p repair, GERD, HTN, substance abuse, chronic hypoxic hypercapnic respiratory failure, severe COPD, chronic diastolic heart failure, chronic tobacco abuse and generalized anxiety disorder who is admitted with end stage COPD with severe aortic stenosis and acute cardiac failure. Pharmacy is asked to follow and replace electrolytes while in CCU  Goal of Therapy:  Potassium 4.0 - 5.1 mmol/L Magnesium 2.0 - 2.4 mg/dL All Other Electrolytes WNL  Plan:  KCL 25mq administered via tube ordered by CCNP Will hold off on further replacement Recheck electrolytes in AM  WPearla Dubonnet,PharmD Clinical Pharmacist 12/24/2021 7:33 AM

## 2021-12-24 NOTE — Progress Notes (Signed)
NAME:  Misty Green, MRN:  [id number], DOB:  1970/05/18, LOS: 2 ADMISSION DATE:  12/20/2021   BRIEF SYNOPSIS end-stage COPD and severe aortic stenosis.   History of Present Illness:   patient was recently hospitalized between 10/16/2021 and 10/19/21 for acute on chronic respiratory failure with hypoxia, she was supported with BiPAP & diuresis and discharged home.  She was also previously intubated requiring mechanical ventilatory support for acute on chronic respiratory failure in January 2022. met with Dr. Haroldine Laws with cardiology to evaluate her severe aortic stenosis in the setting of chronic diastolic heart failure.  Per chart review it was determined she is not a candidate for TAVR.  Her husband intimated she was told she only had a " year to live".  This was explained to the patient to be because of her end-stage COPD and severe aortic stenosis.    Significant Hospital Events: Including procedures, antibiotic start and stop dates in addition to other pertinent events   12/19/2021: Admit to ICU with acute on chronic hypoxic & hypercarbic respiratory failure secondary to end-stage COPD requiring emergent intubation and mechanical ventilatory support. 12/6 severe resp failure, 12/6 CVL placed 12/7 remains on vent severe COPD      Interim History / Subjective:  Remains intubated On vent Remains critically ill On pressors End stage COPD and severe AS Prognosis is grave        Objective   Blood pressure (!) 107/52, pulse (!) 54, temperature 99.1 F (37.3 C), resp. rate 18, height 5' (1.524 m), weight 41.1 kg, SpO2 95 %.    Vent Mode: PRVC FiO2 (%):  [40 %] 40 % Set Rate:  [18 bmp-20 bmp] 18 bmp Vt Set:  [380 mL-420 mL] 380 mL PEEP:  [5 cmH20] 5 cmH20 Plateau Pressure:  [13 cmH20] 13 cmH20   Intake/Output Summary (Last 24 hours) at 12/24/2021 0717 Last data filed at 12/24/2021 0700 Gross per 24 hour  Intake 1349.71 ml  Output 375 ml  Net 974.71 ml    Filed Weights    12/23/21 0339 12/24/21 0500  Weight: 41 kg 41.1 kg     REVIEW OF SYSTEMS  PATIENT IS UNABLE TO PROVIDE COMPLETE REVIEW OF SYSTEMS DUE TO SEVERE CRITICAL ILLNESS   PHYSICAL EXAMINATION:  GENERAL:critically ill appearing, +resp distress EYES: Pupils equal, round, reactive to light.  No scleral icterus.  MOUTH: Moist mucosal membrane. INTUBATED NECK: Supple.  PULMONARY: Lungs clear to auscultation, +rhonchi, +wheezing CARDIOVASCULAR: S1 and S2.  Regular rate and rhythm GASTROINTESTINAL: Soft, nontender, -distended. Positive bowel sounds.  MUSCULOSKELETAL: No swelling, clubbing, or edema.  NEUROLOGIC: obtunded,sedated SKIN:normal, warm to touch, Capillary refill delayed  Pulses present bilaterally  Labs/imaging that I havepersonally reviewed  (right click and "Reselect all SmartList Selections" daily)     ASSESSMENT AND PLAN SYNOPSIS  51 yo white female with end stage COPD with severe aortic stenosis, patient with acute cardiac failure and acute COPD exacerbation leading to intubation  Severe ACUTE Hypoxic and Hypercapnic Respiratory Failure -continue Mechanical Ventilator support -Wean Fio2 and PEEP as tolerated -VAP/VENT bundle implementation - Wean PEEP & FiO2 as tolerated, maintain SpO2 > 88% - Head of bed elevated 30 degrees, VAP protocol in place - Plateau pressures less than 30 cm H20  - Intermittent chest x-ray & ABG PRN - Ensure adequate pulmonary hygiene  -will perform SAT/SBT when respiratory parameters are met  Vent Mode: PRVC FiO2 (%):  [40 %] 40 % Set Rate:  [18 bmp-20 bmp] 18 bmp Vt Set:  [  380 mL-420 mL] 380 mL PEEP:  [5 cmH20] 5 cmH20 Plateau Pressure:  [13 cmH20] 13 cmH20  SEVERE COPD EXACERBATION -continue IV steroids as prescribed -continue NEB THERAPY as prescribed -morphine as needed -wean fio2 as needed and tolerated   ACUTE  CARDIAC FAILURE- SEVERE AS -oxygen as needed -Lasix as tolerated -follow up cardiac enzymes as indicated -follow  up cardiac enzymes as indicated Follow up with cardiology evaluation   CARDIAC ICU monitoring   RENAL -continue Foley Catheter-assess need -Avoid nephrotoxic agents -Follow urine output, BMP -Ensure adequate renal perfusion, optimize oxygenation -Renal dose medications   Intake/Output Summary (Last 24 hours) at 12/24/2021 0719 Last data filed at 12/24/2021 0700 Gross per 24 hour  Intake 1349.71 ml  Output 375 ml  Net 974.71 ml      NEUROLOGY Acute  metabolic encephalopathy, need for sedation Goal RASS -2 to -3   INFECTIOUS DISEASE -follow up cultures   ENDO - ICU hypoglycemic\Hyperglycemia protocol -check FSBS per protocol   GI GI PROPHYLAXIS as indicated  NUTRITIONAL STATUS DIET-->TF's as tolerated Constipation protocol as indicated   ELECTROLYTES -follow labs as needed -replace as needed -pharmacy consultation and following   Best Practice (right click and "Reselect all SmartList Selections" daily)  Diet/type: NPO w/ meds via tube DVT prophylaxis: prophylactic heparin  GI prophylaxis: H2B Lines: N/A Foley:  Yes, and it is still needed Code Status:  full code   Labs   CBC: Recent Labs  Lab 01/01/2022 2313 12/23/21 0701 12/24/21 0456  WBC 7.0 7.2 9.3  HGB 9.8* 9.1* 8.7*  HCT 34.9* 31.9* 30.1*  MCV 105.1* 101.3* 100.3*  PLT 206 198 202     Basic Metabolic Panel: Recent Labs  Lab 01/07/2022 2313 12/23/21 0454 12/24/21 0456  NA 141 140 140  K 4.0 4.2 3.4*  CL 88* 89* 91*  CO2 >45* 44* 43*  GLUCOSE 114* 97 128*  BUN 15 16 25*  CREATININE 0.47 0.48 0.52  CALCIUM 9.0 8.7* 8.6*  MG 1.9 1.9 2.1  PHOS 4.6 2.3* 4.0    GFR: Estimated Creatinine Clearance: 53.4 mL/min (by C-G formula based on SCr of 0.52 mg/dL). Recent Labs  Lab 12/31/2021 2313 12/23/21 0238 12/23/21 0701 12/24/21 0456  PROCALCITON <0.10  --   --  <0.10  WBC 7.0  --  7.2 9.3  LATICACIDVEN  --  1.9  --   --      Liver Function Tests: Recent Labs  Lab  01/03/2022 2313  AST 20  ALT 14  ALKPHOS 47  BILITOT 0.7  PROT 6.7  ALBUMIN 3.3*    No results for input(s): "LIPASE", "AMYLASE" in the last 168 hours. No results for input(s): "AMMONIA" in the last 168 hours.  ABG    Component Value Date/Time   PHART 7.34 (L) 12/30/2021 2357   PCO2ART 103 (HH) 12/19/2021 2357   PO2ART 33 (LL) 01/02/2022 2357   HCO3 52.6 (H) 12/24/2021 0456   TCO2 >50 (H) 07/09/2019 0528   ACIDBASEDEF 1.1 05/20/2018 1240   O2SAT 76.2 12/24/2021 0456    CBG: Recent Labs  Lab 12/23/21 1220 12/23/21 1626 12/23/21 1951 12/23/21 2358 12/24/21 0404  GLUCAP 118* 100* 123* 134* 113*     Allergies Allergies  Allergen Reactions   Ativan [Lorazepam] Other (See Comments)    D/w with dter Aniceto Boss pt gets very combative and agitated. Has tolerated Versed well   Other Nausea And Vomiting    Anti-depressent that starts with t      PATIENT WITH  VERY POOR PROGNOSIS I ANTICIPATE PROLONGED ICU LOS     DVT/GI PRX  assessed I Assessed the need for Labs I Assessed the need for Foley I Assessed the need for Central Venous Line Family Discussion when available I Assessed the need for Mobilization I made an Assessment of medications to be adjusted accordingly Safety Risk assessment completed  CASE DISCUSSED IN MULTIDISCIPLINARY ROUNDS WITH ICU TEAM     Critical Care Time devoted to patient care services described in this note is 55 minutes.  Critical care was necessary to treat /prevent imminent and life-threatening deterioration. Overall, patient is critically ill, prognosis is guarded.  Patient with Multiorgan failure and at high risk for cardiac arrest and death.    Corrin Parker, M.D.  [facility]  Medical Director [facility] Director [facility] Cardio-Pulmonary Department

## 2021-12-24 NOTE — Progress Notes (Signed)
[facility]) Hospital Liaison note:  This patient is currently enrolled in [facility] outpatient-based Palliative Care. Will continue to follow for disposition.  Please call with any outpatient palliative questions or concerns.  Thank you, Lorelee Market, LPN [facility] Liaison [phone]

## 2021-12-24 NOTE — Consult Note (Addendum)
Consultation Note Date: 12/24/2021   Patient Name: Misty Green  DOB: [birth year]  MRN: [id number]  Age / Sex: 51 y.o., female  PCP: [name], MD Referring Physician: [name], MD  Reason for Consultation: Establishing goals of care  HPI/Patient Profile: patient was recently hospitalized between 10/16/2021 and 10/19/21 for acute on chronic respiratory failure with hypoxia, she was supported with BiPAP & diuresis and discharged home.  She was also previously intubated requiring mechanical ventilatory support for acute on chronic respiratory failure in January 2022. met with Dr. Haroldine Laws with cardiology to evaluate her severe aortic stenosis in the setting of chronic diastolic heart failure.  Per chart review it was determined she is not a candidate for TAVR.  Her husband intimated she was told she only had a " year to live".  This was explained to the patient to be because of her end-stage COPD and severe aortic stenosis.     Clinical Assessment and Goals of Care: Notes and labs reviewed. Spoke with husband via phone as patient is intubated. He states she has 2 children, one of whom is a paramedic.   We discussed her diagnoses, prognosis, GOC, EOL wishes disposition and options.  Created space and opportunity for patient  to explore thoughts and feelings regarding current medical information.   A detailed discussion was had today regarding advanced directives.  Concepts specific to code status, artifical feeding and hydration, IV antibiotics and rehospitalization were discussed.  The difference between an aggressive medical intervention path and a comfort care path was discussed.  Values and goals of care important to patient and family were attempted to be elicited.  Discussed limitations of medical interventions to prolong quality of life in some situations and discussed the concept of human  mortality.  Discussed her decline and status. He discusses her baseline SOB and activity intolerance. He states she bathes around every 2 weeks because she is too SOB. He states she does not leave the house. He tells me she had previously been a DNR but that was changed. He states she has stated she does not want to suffer.  He states she would not want CPR. We discussed having a family meeting including children tomorrow.  Husband does not want to make any changes to code status or other orders until we meet.      SUMMARY OF RECOMMENDATIONS   Meet tomorrow at 10:00. Attending aware       Primary Diagnoses: Present on Admission:  Acute on chronic respiratory failure ([place])   I have reviewed the medical record, interviewed the patient and family, and examined the patient. The following aspects are pertinent.  Past Medical History:  Diagnosis Date   Cardiac arrest ([place]) 05/12/2018   CHF (congestive heart failure) (HCC)    Chicken pox    COPD (chronic obstructive pulmonary disease) (HCC)    Current smoker    Dyspnea    GERD (gastroesophageal reflux disease)    Hypertension    Malfunction of colostomy and enterostomy ([place])  Opiate abuse, continuous (HCC)    Oxygen deficit    Sleep apnea    Social History   Socioeconomic History   Marital status: Married    Spouse name: Not on file   Number of children: Not on file   Years of education: Not on file   Highest education level: Not on file  Occupational History   Not on file  Tobacco Use   Smoking status: Former    Packs/day: 1.00    Years: 15.00    Total pack years: 15.00    Types: Cigarettes   Smokeless tobacco: Never   Tobacco comments:    4 cigarettes a day   Vaping Use   Vaping Use: Former   Substances: Nicotine  Substance and Sexual Activity   Alcohol use: Yes    Comment: rarely   Drug use: Yes    Types: Marijuana    Comment: not everyday    Sexual activity: Not on file  Other Topics Concern   Not on  file  Social History Narrative   Not on file   Social Determinants of Health   Financial Resource Strain: Not on file  Food Insecurity: No Food Insecurity (10/17/2021)   Hunger Vital Sign    Worried About Running Out of Food in the Last Year: Never true    Ran Out of Food in the Last Year: Never true  Transportation Needs: No Transportation Needs (10/17/2021)   PRAPARE - Hydrologist (Medical): No    Lack of Transportation (Non-Medical): No  Physical Activity: Not on file  Stress: Not on file  Social Connections: Not on file   Family History  Problem Relation Age of Onset   Diabetes Mother    COPD Mother    Hypertension Father    Diabetes Father    Heart disease Father    Hyperlipidemia Father    Prostate cancer Father    Scheduled Meds:  budesonide (PULMICORT) nebulizer solution  0.5 mg Nebulization BID   Chlorhexidine Gluconate Cloth  6 each Topical Q0600   docusate  100 mg Per Tube BID   escitalopram  20 mg Per Tube Daily   famotidine  20 mg Per Tube BID   folic acid  1 mg Per Tube Daily   free water  30 mL Per Tube Q4H   heparin  5,000 Units Subcutaneous Q8H   insulin aspart  0-20 Units Subcutaneous Q4H   ipratropium-albuterol  3 mL Nebulization Q4H   methylPREDNISolone (SOLU-MEDROL) injection  20 mg Intravenous Q12H   mouth rinse  15 mL Mouth Rinse Q2H   polyethylene glycol  17 g Per Tube Daily   QUEtiapine  50 mg Per Tube QHS   Continuous Infusions:  sodium chloride     dexmedetomidine (PRECEDEX) IV infusion 1 mcg/kg/hr (12/24/21 0908)   feeding supplement (VITAL AF 1.2 CAL) 55 mL/hr at 12/24/21 0700   fentaNYL infusion INTRAVENOUS 200 mcg/hr (12/24/21 1204)   midazolam 2 mg/hr (12/24/21 1204)   norepinephrine (LEVOPHED) Adult infusion 2 mcg/min (12/24/21 0700)   PRN Meds:.docusate sodium, fentaNYL, fentaNYL (SUBLIMAZE) injection, midazolam, mouth rinse, polyethylene glycol, vecuronium Medications Prior to Admission:  Prior to  Admission medications   Medication Sig Start Date End Date Taking? Authorizing Provider  buPROPion (WELLBUTRIN) 75 MG tablet Take 75 mg by mouth 2 (two) times daily.   Yes [provider]  escitalopram (LEXAPRO) 20 MG tablet One 73m tablet po daily 06/24/21  Yes WLoletha Grayer MD  furosemide (  LASIX) 40 MG tablet Take 40 mg by mouth daily. 01/20/20  Yes [provider]  gabapentin (NEURONTIN) 400 MG capsule Take 1 capsule (400 mg total) by mouth 3 (three) times daily. 07/12/19  Yes Mercy Riding, MD  Melatonin 10 MG TABS Take 20 mg by mouth at bedtime.   Yes [provider]  Multiple Vitamins-Minerals (MULTIVITAMIN WITH MINERALS) tablet Take 1 tablet by mouth at bedtime.   Yes [provider]  polyethylene glycol (MIRALAX / GLYCOLAX) 17 g packet Take 17 g by mouth daily. 02/12/20  Yes Eugenie Filler, MD  potassium chloride (KLOR-CON M) 20 MEQ tablet Take 1 tablet (20 mEq total) by mouth daily. 06/24/21  Yes [name], MD  PRESCRIPTION MEDICATION Inhale into the lungs at bedtime. BiPap   Yes [provider]  QUEtiapine (SEROQUEL) 50 MG tablet Take 50 mg by mouth at bedtime. 09/23/21  Yes [provider]  Grant Ruts INHUB 500-50 MCG/ACT AEPB Inhale 1 puff into the lungs every 12 (twelve) hours. 09/28/21  Yes [provider]  acetaminophen (TYLENOL) 325 MG tablet Take 1-2 tablets (325-650 mg total) by mouth every 4 (four) hours as needed for mild pain. Patient taking differently: Take 650 mg by mouth every 6 (six) hours as needed for mild pain. 06/05/18   Love, Ivan Anchors, PA-C  albuterol (PROVENTIL) (2.5 MG/3ML) 0.083% nebulizer solution Take 2.5 mg by nebulization every 6 (six) hours as needed for wheezing or shortness of breath. Inhale one vial via nebulizer 4 times a day as directed.    [provider]  albuterol (VENTOLIN HFA) 108 (90 Base) MCG/ACT inhaler Inhale 2 puffs into the lungs every 6 (six) hours as needed for wheezing or  shortness of breath. 10/19/21   Fritzi Mandes, MD   Allergies  Allergen Reactions   Ativan [Lorazepam] Other (See Comments)    D/w with dter Aniceto Boss pt gets very combative and agitated. Has tolerated Versed well   Other Nausea And Vomiting    Anti-depressent that starts with t   Review of Systems  Unable to perform ROS   Physical Exam Constitutional:      Comments: Eyes closed.   Pulmonary:     Comments: On ventilator.     Vital Signs: BP 119/74   Pulse 75   Temp 98.1 F (36.7 C)   Resp (!) 21   Ht 5' (1.524 m)   Wt 41.1 kg   SpO2 94%   BMI 17.70 kg/m  Pain Scale: CPOT       SpO2: SpO2: 94 % O2 Device:SpO2: 94 % O2 Flow Rate: .   IO: Intake/output summary:  Intake/Output Summary (Last 24 hours) at 12/24/2021 1240 Last data filed at 12/24/2021 0700 Gross per 24 hour  Intake 1274.63 ml  Output 300 ml  Net 974.63 ml    LBM: Last BM Date : 12/24/21 Baseline Weight: Weight: 41 kg Most recent weight: Weight: 41.1 kg      Signed by: Asencion Gowda, NP   Please contact Palliative Medicine Team phone at [phone] for questions and concerns.  For individual provider: See Shea Evans

## 2021-12-24 NOTE — TOC Initial Note (Signed)
Transition of Care [facility]) - Initial/Assessment Note    Patient Details  Name: Misty Green MRN: [id number] Date of Birth: 04/03/1970  [facility] CM/SW Contact:    Shelbie Hutching, RN Phone Number: 12/24/2021, 8:53 AM  Clinical Narrative:                 Patient admitted with acute on chronic respiratory failure.  Palliative care is following and has a meeting with the family for goals of care today. TOC will follow.         Patient Goals and CMS Choice        Expected Discharge Plan and Services                                                Prior Living Arrangements/Services                       Activities of Daily Living      Permission Sought/Granted                  Emotional Assessment              Admission diagnosis:  CO2 narcosis [R06.89] Acute on chronic respiratory failure (HCC) [J96.20] Acute on chronic respiratory failure with hypoxia and hypercapnia (HCC) [N05.39, J96.22] Patient Active Problem List   Diagnosis Date Noted   Acute on chronic respiratory failure ([place]) 12/18/2021   Abdominal pain 10/17/2021   Acute on chronic respiratory failure with hypoxia ([place]) 10/16/2021   Colostomy status s/p partial colectomy for recurrent diverticulitis ([place]) 10/16/2021   Unintentional weight loss 06/21/2021   Abdominal tenderness 06/21/2021   Nausea & vomiting 06/21/2021   History of diverticulitis 06/21/2021   History of repair of inguinal hernia 06/21/2021   Elevated troponin 06/21/2021   Acute on chronic respiratory failure with hypoxemia ([place]) 06/20/2021   CAP (community acquired pneumonia) 06/20/2021   Hypokalemia 06/20/2021   Acute on chronic diastolic CHF (congestive heart failure) ([place]) 01/22/2020   Hypercapnic respiratory failure, chronic (HCC) 07/08/2019   Chronic fatigue 05/16/2019   SOB (shortness of breath) 05/16/2019   Seasonal allergic reaction 05/16/2019   Anxiety disorder due to multiple  medical problems 05/16/2019   Edema 05/16/2019   Encounter for screening mammogram for malignant neoplasm of breast 05/16/2019   OSA (obstructive sleep apnea)    Noncompliance with CPAP treatment    Chronic obstructive pulmonary disease ([place])    Chest wall pain    Essential hypertension    Steroid-induced hyperglycemia    AKI (acute kidney injury) ([place])    Supplemental oxygen dependent    Generalized anxiety disorder    Hypoxic encephalopathy ([place]) 05/27/2018   Anoxic brain injury ([place])    ARF (acute renal failure) (HCC)    Lactic acidosis    Transaminitis    Acute on chronic respiratory failure with hypercapnia ([place])    Cardiac arrest ([place]) 05/12/2018   Diverticulitis of colon (without mention of hemorrhage)(562.11)    Acute diverticulitis 01/06/2018   Mixed anxiety and depressive disorder 12/22/2017   Obesity 12/22/2017   Tobacco user 12/22/2017   Diverticulitis 12/03/2017   Spinal stenosis of lumbar region 04/12/2016   Acute respiratory failure ([place]) 03/22/2016   COPD exacerbation ([place]) 03/22/2016   Chronic pain syndrome 03/22/2016   Opioid type dependence, abuse ([place]) 03/22/2016  Polycythemia 03/22/2016   Anxiety 03/22/2016   OSA on CPAP 03/22/2016   Closed fracture of distal end of right radius 08/25/2015   Closed nondisplaced fracture of styloid process of right ulna 08/25/2015   PCP:  [name], MD Pharmacy:   [id number] [place] [id number]: [phone]: [phone] [facility] [place] - [address], [address], [place]Phone: [phone]: [phone] [id number] [place] [address] [place]: [phone]: [phone]   Social Determinants of Health (SDOH) Interventions    Readmission Risk Interventions     No data to display

## 2021-12-25 DIAGNOSIS — Z515 Encounter for palliative care: Secondary | ICD-10-CM | POA: Diagnosis not present

## 2021-12-25 DIAGNOSIS — J9622 Acute and chronic respiratory failure with hypercapnia: Secondary | ICD-10-CM | POA: Diagnosis not present

## 2021-12-25 DIAGNOSIS — J9621 Acute and chronic respiratory failure with hypoxia: Secondary | ICD-10-CM | POA: Diagnosis not present

## 2021-12-25 DIAGNOSIS — Z7189 Other specified counseling: Secondary | ICD-10-CM

## 2021-12-25 LAB — BASIC METABOLIC PANEL
Anion gap: 3 — ABNORMAL LOW (ref 5–15)
BUN: 21 mg/dL — ABNORMAL HIGH (ref 6–20)
CO2: 43 mmol/L — ABNORMAL HIGH (ref 22–32)
Calcium: 8.7 mg/dL — ABNORMAL LOW (ref 8.9–10.3)
Chloride: 93 mmol/L — ABNORMAL LOW (ref 98–111)
Creatinine, Ser: <0.3 mg/dL — ABNORMAL LOW (ref 0.44–1.00)
Glucose, Bld: 130 mg/dL — ABNORMAL HIGH (ref 70–99)
Potassium: 4.2 mmol/L (ref 3.5–5.1)
Sodium: 139 mmol/L (ref 135–145)

## 2021-12-25 LAB — CBC
HCT: 29.3 % — ABNORMAL LOW (ref 36.0–46.0)
Hemoglobin: 8.4 g/dL — ABNORMAL LOW (ref 12.0–15.0)
MCH: 29.2 pg (ref 26.0–34.0)
MCHC: 28.7 g/dL — ABNORMAL LOW (ref 30.0–36.0)
MCV: 101.7 fL — ABNORMAL HIGH (ref 80.0–100.0)
Platelets: 186 10*3/uL (ref 150–400)
RBC: 2.88 MIL/uL — ABNORMAL LOW (ref 3.87–5.11)
RDW: 13.9 % (ref 11.5–15.5)
WBC: 10.1 10*3/uL (ref 4.0–10.5)
nRBC: 0 % (ref 0.0–0.2)

## 2021-12-25 LAB — PROCALCITONIN: Procalcitonin: <0.1 ng/mL

## 2021-12-25 LAB — MAGNESIUM: Magnesium: 2 mg/dL (ref 1.7–2.4)

## 2021-12-25 LAB — GLUCOSE, CAPILLARY
Glucose-Capillary: 132 mg/dL — ABNORMAL HIGH (ref 70–99)
Glucose-Capillary: 130 mg/dL — ABNORMAL HIGH (ref 70–99)

## 2021-12-25 LAB — PHOSPHORUS: Phosphorus: 4 mg/dL (ref 2.5–4.6)

## 2021-12-25 MED ORDER — GLYCOPYRROLATE 0.2 MG/ML IJ SOLN
INTRAMUSCULAR | Status: AC
  Administered 2021-12-25: 0.2 mg
  Filled 2021-12-25: qty 1

## 2021-12-25 MED ORDER — GLYCOPYRROLATE 0.2 MG/ML IJ SOLN: 0.2000 mg | Freq: Once | INTRAMUSCULAR | Status: DC

## 2021-12-25 MED ORDER — LORAZEPAM 2 MG/ML IJ SOLN: 2.0000 mg | INTRAMUSCULAR | Status: DC | PRN

## 2021-12-25 MED ORDER — GLYCOPYRROLATE 0.2 MG/ML IJ SOLN: 0.2000 mg | INTRAMUSCULAR | Status: DC | PRN

## 2021-12-25 MED ORDER — MORPHINE SULFATE (PF) 2 MG/ML IV SOLN
2.0000 mg | INTRAVENOUS | Status: DC | PRN
  Administered 2021-12-25: 4 mg via INTRAVENOUS
  Filled 2021-12-25: qty 2

## 2021-12-25 MED ORDER — HALOPERIDOL LACTATE 5 MG/ML IJ SOLN
2.0000 mg | Freq: Four times a day (QID) | INTRAMUSCULAR | Status: DC | PRN
  Administered 2021-12-25: 2 mg via INTRAVENOUS

## 2021-12-25 MED ORDER — HALOPERIDOL LACTATE 5 MG/ML IJ SOLN
INTRAMUSCULAR | Status: AC
  Filled 2021-12-25: qty 1

## 2021-12-30 NOTE — Addendum Note (Signed)
Encounter addended by: Micki Riley, RN on: 12/30/2021 1:25 PM  Actions taken: Imaging Exam ended

## 2022-01-18 NOTE — Progress Notes (Signed)
Nutrition Brief Note  Chart reviewed. Pt now transitioning to comfort care.  No further nutrition interventions planned at this time.  Please re-consult as needed.   Aleane Wesenberg W, RD, LDN, CDCES Registered Dietitian II Certified Diabetes Care and Education Specialist Please refer to AMION for RD and/or RD on-call/weekend/after hours pager   

## 2022-01-18 NOTE — Progress Notes (Signed)
COMMUNITY PALLIATIVE CARE SW NOTE  PATIENT NAME: Misty Green DOB: 02/16/1970 MRN: [id number]  PRIMARY CARE PROVIDER: [name], MD  RESPONSIBLE PARTY:  Acct ID - Guarantor Home Phone Work Phone Relationship Acct Type  [id number] Misty Green,* [phone]  Self P/F     [place], [place], [place] [address]   SW and RN-M. [name] completed an initial palliative care visit with patient at her home. She was present with her husband, but he later left for an appointment. The patient and her husband was provided education regarding palliative care services and given a folder with additional literature and contact information. Patient/husband provided verbal consent to services. Patient diagnosis is end-stage COPD.   PLAN OF CARE and INTERVENTIONS:             GOALS OF CARE/ ADVANCE CARE PLANNING:  Patient's goal is to remain at home and get better. She has a MOST form on file. DNR indicated. SOCIAL/EMOTIONAL/SPIRITUAL ASSESSMENT/ INTERVENTIONS:  Patient was sitting on the couch, wearing her o2. Patient reported that she was hot despite having air conditioner and ceiling fan on. Patient was observed with having shortness of breath with dialogue with the team. She appeared to become anxious and stated she was having difficulty breathing. She did a nebulizer treatment. Following treatment patient breathing seemed much better. Patient is on 3L of continuous o2. Patient's vitals and oxygen saturation was assessed by the RN. Following her nebulizer treatment her o2 usually came up to 88-92% range. The patient report that she will temporarily increase her O2 to 3 1/2 to 4L with exertion, then drop it back down at rest. She has a Trilogy machine she uses mainly at night, but sometimes during the day. At night she increases her O2 from 3L to 4L. Patient has a nonproductive cough. She reports being told her prognosis is 3-4 months and although she had been educated about hospice,  she was told she  wasn't quite ready for this service yet. The team reinforced education regarding hospice eligibility and the prognosis of 6 months or less. Patient stressed that she was told she was not ready for hospice just yet and she feels she has more life to live. Her husband report increased periods of confusion with patient. She ambulates independently despite having assistive devices available (rollator). Her gait is unsteady and she requires assistance frequent rest breaks when completing personal care tasks. Patient manages her own medications. No pain issues. Patient's appetite remains fair. PATIENT/CAREGIVER EDUCATION/ COPING:  Patient appears to be coping well. She reports that she has supportive friends and family. She also has a strong faith background.  PERSONAL EMERGENCY PLAN:  Patient is able to verbalize her needs and she is able to access 911 for emergencies. Patient is is long alone short periods when her husband has medical appointments.  COMMUNITY RESOURCES COORDINATION/ HEALTH CARE NAVIGATION:  Patient is open to ongoing palliative care support. Patient missed an appointment with her cardiologist this morning. FINANCIAL/LEGAL CONCERNS/INTERVENTIONS:  No financial concerns. Patient has [place].     SOCIAL HX: Patient was born and raised in [place], [place]. She completed high school. Patient has been married to her husband for 27 years. They have 4 children and 8 grandchildren. Patient is Christian by faith.   Social History   Tobacco Use   Smoking status: Former    Packs/day: 1.00    Years: 15.00    Total pack years: 15.00    Types: Cigarettes   Smokeless tobacco: Never  Tobacco comments:    4 cigarettes a day   Substance Use Topics   Alcohol use: Yes    Comment: rarely    CODE STATUS: Full Code ADVANCED DIRECTIVES: No MOST FORM COMPLETE: Yes, on filed [place]: Yes, patient currently meet the criteria, but decline services.  Duration of visit and  documentation: 60 minutes.  [address] [place], [place]

## 2022-01-18 NOTE — Progress Notes (Addendum)
                                                                                                                                                                                                         Daily Progress Note   Patient Name: Misty Green       Date: 01/04/2022 DOB: 05/14/1970  Age: 52 y.o. MRN#: [id number] Attending Physician: Kasa, Kurian, MD Primary Care Physician: Kalisetti, Radhika, MD Admit Date: 12/29/2021  Reason for Consultation/Follow-up: Establishing goals of care  Subjective: Notes reviewed. In to see patient. Husband, children, their spouses, patient's father, and other family members are at bedside.   Husband and children are clear they are ready to shift to comfort care.  They are hopeful that patient will be able to be extubated and hopefully go home with hospice, or to the hospice facility.    They discussed that the patient's father would like to be at bedside during extubation.  They advised patient's father has already lost 2 sons and his wife.  We discussed concern for initial extubation and need for symptom management.  Daughter states she does not want her mother to suffer and feels that she will likely die soon.  She works to balance having paramedic knowledge and also being patient's daughter.  Family spoke with patient's father and other family members, and they stepped out for extubation.  Patient's husband remained at bedside.  Daughter chose to step out with other family members.  Medications for sedation discontinued, and patient awakened and became restless and was trying to remove tube.  RN and RT at bedside, and with explaining that we were working to remove the tube patient relaxed.  Upon extubation, patient's eyes rolled toward the ceiling, she became diaphoretic and her saturation dropped very quickly.  Oxygen by nasal cannula put in place following extubation and husband states patient has had an extremely stuffy nose, so this was changed to a  nonrebreather based on this.  Fentanyl and Versed infusion restarted at lower dosing, and boluses given for patient's comfort. Robinul provided. Patient's husband requests that I ask daughter Tasha to come in to bedside.  Tasha enters and then steps out as she states that she does not want to see her mother suffer.   Once patient was comfortable, husband desires for other family members to come to bedside to visit with her, and is hopeful she would be able to wake up to speak with them. Patient did not tolerate attempts at decreasing Fentanyl or Versed. We discussed the NRB in detail, and   he would like this removed.     I remained at bedside and on unit to answer questions for husband and family, and to assist nurse with symptom management. Family shares stories and memories about her. They state she enjoys spending time with family and grandchildren, daughter stated during meeting yesterday, patient could play with them 6 months ago, but over the past few weeks could not. Son stated during meeting yesterday, she had said "her goodbyes" to him prior to admission as she knew her time was limited, and he maintains that he does not want her to suffer.   Fentanyl and Versed infusion adjusted with boluses provided and infusions increased as needed to maintain patient's comfort.  Daughter again states that she does not want her mother to suffer.  Called in to bedside by family as patient is anxious.  For a few moments, she opened her eyes to look at her husband, and raised her arm a few times as he spoke to her, and it appeared she was trying to communicate with him.  Some work of breathing noted.  Medications adjusted further for comfort.  Husband states he does not want his wife to suffer and is thankful that they were able to spend time with her.  2 hours spent with patient and family. Left unit with nurse at bedside.  Patient appears comfortable at the time of my departure.  Orders are in place to continue comfort  care.  I completed a MOST form today and the signed original was placed in the chart. The form was scanned and sent to medical records for it to be uploaded under ACP tab in Epic. A photocopy was also placed in the chart to be scanned into EMR. The patient outlined their wishes for the following treatment decisions:  Cardiopulmonary Resuscitation: Do Not Attempt Resuscitation (DNR/No CPR)  Medical Interventions: Comfort Measures: Keep clean, warm, and dry. Use medication by any route, positioning, wound care, and other measures to relieve pain and suffering. Use oxygen, suction and manual treatment of airway obstruction as needed for comfort. Do not transfer to the hospital unless comfort needs cannot be met in current location.  Antibiotics: No antibiotics (use other measures to relieve symptoms)  IV Fluids: No IV fluids (provide other measures to ensure comfort)  Feeding Tube: No feeding tube      Length of Stay: 3  Current Medications: Scheduled Meds:   budesonide (PULMICORT) nebulizer solution  0.5 mg Nebulization BID   Chlorhexidine Gluconate Cloth  6 each Topical Q0600   docusate  100 mg Per Tube BID   escitalopram  20 mg Per Tube Daily   famotidine  20 mg Per Tube BID   folic acid  1 mg Per Tube Daily   free water  30 mL Per Tube Q4H   heparin  5,000 Units Subcutaneous Q8H   insulin aspart  0-20 Units Subcutaneous Q4H   ipratropium-albuterol  3 mL Nebulization Q4H   methylPREDNISolone (SOLU-MEDROL) injection  20 mg Intravenous Q12H   mouth rinse  15 mL Mouth Rinse Q2H   polyethylene glycol  17 g Per Tube Daily   QUEtiapine  50 mg Per Tube QHS    Continuous Infusions:  sodium chloride     dexmedetomidine (PRECEDEX) IV infusion Stopped (01/11/2022 0914)   feeding supplement (VITAL AF 1.2 CAL) Stopped (12/28/2021 0900)   fentaNYL infusion INTRAVENOUS 125 mcg/hr (12/21/2021 1042)   midazolam 3 mg/hr (01/12/2022 1044)   norepinephrine (LEVOPHED) Adult infusion Stopped (12/24/21 2140)       PRN Meds: docusate sodium, fentaNYL, fentaNYL (SUBLIMAZE) injection, midazolam, mouth rinse, polyethylene glycol, vecuronium  Physical Exam Pulmonary:     Comments: Extubated.  Neurological:     Mental Status: She is alert.             Vital Signs: BP (!) 110/55   Pulse (!) 59   Temp 98.4 F (36.9 C)   Resp 18   Ht 5' (1.524 m)   Wt 41.1 kg   SpO2 96%   BMI 17.70 kg/m  SpO2: SpO2: 96 % O2 Device: O2 Device: Ventilator O2 Flow Rate:    Intake/output summary:  Intake/Output Summary (Last 24 hours) at 12/20/2021 1125 Last data filed at 01/17/2022 0945 Gross per 24 hour  Intake 2190.19 ml  Output 595 ml  Net 1595.19 ml   LBM: Last BM Date : 12/24/21 Baseline Weight: Weight: 41 kg Most recent weight: Weight: 41.1 kg   Patient Active Problem List   Diagnosis Date Noted   Acute on chronic respiratory failure (HCC) 01/11/2022   Abdominal pain 10/17/2021   Acute on chronic respiratory failure with hypoxia (HCC) 10/16/2021   Colostomy status s/p partial colectomy for recurrent diverticulitis (HCC) 10/16/2021   Unintentional weight loss 06/21/2021   Abdominal tenderness 06/21/2021   Nausea & vomiting 06/21/2021   History of diverticulitis 06/21/2021   History of repair of inguinal hernia 06/21/2021   Elevated troponin 06/21/2021   Acute on chronic respiratory failure with hypoxemia (HCC) 06/20/2021   CAP (community acquired pneumonia) 06/20/2021   Hypokalemia 06/20/2021   Acute on chronic diastolic CHF (congestive heart failure) (HCC) 01/22/2020   Hypercapnic respiratory failure, chronic (HCC) 07/08/2019   Chronic fatigue 05/16/2019   SOB (shortness of breath) 05/16/2019   Seasonal allergic reaction 05/16/2019   Anxiety disorder due to multiple medical problems 05/16/2019   Edema 05/16/2019   Goals of care, counseling/discussion 05/16/2019   OSA (obstructive sleep apnea)    Noncompliance with CPAP treatment    Chronic obstructive pulmonary disease (HCC)     Chest wall pain    Essential hypertension    Steroid-induced hyperglycemia    AKI (acute kidney injury) (HCC)    Supplemental oxygen dependent    Generalized anxiety disorder    Hypoxic encephalopathy (HCC) 05/27/2018   Anoxic brain injury (HCC)    ARF (acute renal failure) (HCC)    Lactic acidosis    Transaminitis    Acute on chronic respiratory failure with hypercapnia (HCC)    Cardiac arrest (HCC) 05/12/2018   Diverticulitis of colon (without mention of hemorrhage)(562.11)    Acute diverticulitis 01/06/2018   Mixed anxiety and depressive disorder 12/22/2017   Obesity 12/22/2017   Tobacco user 12/22/2017   Diverticulitis 12/03/2017   Spinal stenosis of lumbar region 04/12/2016   Acute respiratory failure (HCC) 03/22/2016   COPD exacerbation (HCC) 03/22/2016   Chronic pain syndrome 03/22/2016   Opioid type dependence, abuse (HCC) 03/22/2016   Polycythemia 03/22/2016   Anxiety 03/22/2016   OSA on CPAP 03/22/2016   Closed fracture of distal end of right radius 08/25/2015   Closed nondisplaced fracture of styloid process of right ulna 08/25/2015    Palliative Care Assessment & Plan    Recommendations/Plan: Patient on comfort care. Anticipate hospital death  Code Status:    Code Status Orders  (From admission, onward)           Start     Ordered   12/24/21 0912  Do not attempt resuscitation (DNR)  Continuous         Question Answer Comment  In the event of cardiac or respiratory ARREST Do not call a "code blue"   In the event of cardiac or respiratory ARREST Do not perform Intubation, CPR, defibrillation or ACLS   In the event of cardiac or respiratory ARREST Use medication by any route, position, wound care, and other measures to relive pain and suffering. May use oxygen, suction and manual treatment of airway obstruction as needed for comfort.      12/24/21 0911           Code Status History     Date Active Date Inactive Code Status Order ID Comments User  Context   01/02/2022 2358 12/24/2021 0911 Full Code [id number]  Rust-Chester, Britton L, NP ED   10/16/2021 2019 10/19/2021 1734 Full Code [id number]  Duncan, Hazel V, MD ED   06/21/2021 0112 06/25/2021 0001 Full Code [id number]  Garba, Mohammad L, MD Inpatient   01/31/2020 0819 02/11/2020 2225 Partial Code [id number]  Kasa, Kurian, MD Inpatient   01/22/2020 1623 01/31/2020 0818 Full Code [id number]  Howerter, Justin B, DO ED   07/08/2019 1658 07/12/2019 1736 Full Code [id number]  Eubanks, Katalina M, NP ED   05/27/2018 1455 06/05/2018 2002 Full Code [id number]  Love, Pamela S, PA-C Inpatient   05/14/2018 1534 05/27/2018 1447 Full Code [id number]  Svalina, Gorica, MD Inpatient   05/13/2018 0149 05/14/2018 1534 Partial Code [id number]  Scatliffe, Kristen D, MD Inpatient   05/12/2018 2344 05/13/2018 0149 Full Code [id number]  Scatliffe, Kristen D, MD ED   01/06/2018 2039 01/18/2018 2136 Full Code [id number]  Patel, Shreyang, MD Inpatient   12/03/2017 1840 12/13/2017 1740 Full Code [id number]  Sainani, Vivek J, MD Inpatient   03/22/2016 0059 03/24/2016 1705 Full Code [id number]  Smith, Rondell A, MD ED       Prognosis:  Hours - Days    Care plan was discussed with CCM and RN  Thank you for allowing the Palliative Medicine Team to assist in the care of this patient.   Time In: 9:00 Time Out: 11:10 Total Time 130 min Prolonged Time Billed  yes       Greater than 50%  of this time was spent counseling and coordinating care related to the above assessment and plan.   , NP  Please contact Palliative Medicine Team phone at [phone] for questions and concerns.       

## 2022-01-18 NOTE — Death Summary Note (Signed)
DEATH SUMMARY   Patient Details  Name: Misty Green MRN: [id number] DOB: Sep 30, 1970  Admission/Discharge Information   Admit Date:  2022-01-15  Date of Death: Date of Death: Jan 18, 2022  Time of Death: Time of Death: 25-Mar-1222  Length of Stay: 3  Referring Physician: [name], MD   Reason(s) for Hospitalization  end-stage COPD and severe aortic stenosis  Diagnoses  Preliminary cause of death: end-stage COPD and severe aortic stenosis [place]. dary Diagnoses (including complications and co-morbidities):  Principal Problem:   Acute on chronic respiratory failure (HCC) Active Problems:   Goals of care, counseling/discussion   End of life care   Brief Hospital Course (including significant findings, care, treatment, and services provided and events leading to death)       patient was recently hospitalized between 10/16/2021 and 10/19/21 for acute on chronic respiratory failure with hypoxia, she was supported with BiPAP & diuresis and discharged home.  She was also previously intubated requiring mechanical ventilatory support for acute on chronic respiratory failure in January 2022. met with Dr. Haroldine Laws with cardiology to evaluate her severe aortic stenosis in the setting of chronic diastolic heart failure.  Per chart review it was determined she is not a candidate for TAVR.  Her husband intimated she was told she only had a " year to live".  This was explained to the patient to be because of her end-stage COPD and severe aortic stenosis.      Significant Hospital Events: Including procedures, antibiotic start and stop dates in addition to other pertinent events   Jan 15, 2022: Admit to ICU with acute on chronic hypoxic & hypercarbic respiratory failure secondary to end-stage COPD requiring emergent intubation and mechanical ventilatory support. 12/6 severe resp failure, 12/6 CVL placed 12/7 remains on vent severe COPD    REVIEW OF SYSTEMS   PATIENT IS UNABLE TO PROVIDE COMPLETE  REVIEW OF SYSTEMS DUE TO SEVERE CRITICAL ILLNESS    PHYSICAL EXAMINATION:   GENERAL:critically ill appearing, +resp distress EYES: Pupils equal, round, reactive to light.  No scleral icterus.  MOUTH: Moist mucosal membrane. INTUBATED NECK: Supple.  PULMONARY: Lungs clear to auscultation, +rhonchi, +wheezing CARDIOVASCULAR: S1 and S2.  Regular rate and rhythm GASTROINTESTINAL: Soft, nontender, -distended. Positive bowel sounds.  MUSCULOSKELETAL: No swelling, clubbing, or edema.  NEUROLOGIC: obtunded,sedated SKIN:normal, warm to touch, Capillary refill delayed  Pulses present bilaterally   52 yo white female with end stage COPD with severe aortic stenosis, patient with acute cardiac failure and acute COPD exacerbation leading to intubation   Severe ACUTE Hypoxic and Hypercapnic Respiratory Failure -continue Mechanical Ventilator support -Wean Fio2 and PEEP as tolerated -VAP/VENT bundle implementation - Wean PEEP & FiO2 as tolerated, maintain SpO2 > 88% - Head of bed elevated 30 degrees, VAP protocol in place - Plateau pressures less than 30 cm H20  - Intermittent chest x-ray & ABG PRN - Ensure adequate pulmonary hygiene  Failure to wean from vent Plan for one way extubation      GOALS OF CARE DISCUSSION  The Clinical status was relayed to family in [place] for one way extubation today Jan 19, 2023  Updated and notified of patients medical condition- Patient remains unresponsive and will not open eyes to command.   Patient is having a weak cough and struggling to remove secretions.   Patient with increased WOB and using accessory muscles to breathe Explained to family course of therapy and the modalities   Patient with Progressive multiorgan failure with a very high probablity of a very minimal chance of  meaningful recovery despite all aggressive and optimal medical therapy.  PATIENT REMAINS DNR/DNI  Family understands the situation.  Patient was extubated, but then quickly  declined and was transitioned to comfort care measures Patient died 12:24PM     Pertinent Labs and Studies  Significant Diagnostic Studies DG Chest Port 1 View  Result Date: 12/23/2021 CLINICAL DATA:  Central line placement EXAM: PORTABLE CHEST 1 VIEW COMPARISON:  01/07/2022 FINDINGS: Endotracheal tube tip 2.9 cm above the carina. New left IJ line tip: SVC. Nasogastric tube enters the stomach. No pneumothorax. The patient is rotated to the right on today's radiograph, reducing diagnostic sensitivity and specificity. Airway thickening is present, suggesting bronchitis or reactive airways disease. No discrete airspace opacity is identified. No blunting of the costophrenic angles, with the left costophrenic angle slightly excluded. Old granulomatous disease with small calcified granuloma seen in the left upper lobe. Atherosclerotic calcification of the aortic arch. IMPRESSION: 1. New left IJ line tip: SVC. No pneumothorax. 2. Airway thickening is present, suggesting bronchitis or reactive airways disease. 3. Old granulomatous disease. 4. Endotracheal and nasogastric tubes appear satisfactorily positioned. 5.  Aortic Atherosclerosis (ICD10-I70.0). Electronically Signed   By: Van Clines M.D.   On: 12/23/2021 19:21   DG Abd 1 View  Result Date: 12/23/2021 CLINICAL DATA:  Orogastric tube placement. EXAM: ABDOMEN - 1 VIEW COMPARISON:  December 22, 2021. FINDINGS: Distal portion of nasogastric tube appears to be within the proximal stomach, having been mildly advanced since prior exam. IMPRESSION: Distal portion of nasogastric tube appears to be within the proximal stomach. Electronically Signed   By: Marijo Conception M.D.   On: 12/23/2021 13:52   CT Head Wo Contrast  Result Date: 12/23/2021 CLINICAL DATA:  Headache, increasing frequency or severity EXAM: CT HEAD WITHOUT CONTRAST TECHNIQUE: Contiguous axial images were obtained from the base of the skull through the vertex without intravenous  contrast. RADIATION DOSE REDUCTION: This exam was performed according to the departmental dose-optimization program which includes automated exposure control, adjustment of the mA and/or kV according to patient size and/or use of iterative reconstruction technique. COMPARISON:  05/13/2018 FINDINGS: Brain: No acute intracranial abnormality. Specifically, no hemorrhage, hydrocephalus, mass lesion, acute infarction, or significant intracranial injury. Vascular: No hyperdense vessel or unexpected calcification. Skull: No acute calvarial abnormality. Sinuses/Orbits: No acute findings Other: None IMPRESSION: Normal study. Electronically Signed   By: Rolm Baptise M.D.   On: 12/23/2021 01:22   DG Abdomen 1 View  Result Date: 01/17/2022 CLINICAL DATA:  Orogastric tube placement. EXAM: ABDOMEN - 1 VIEW COMPARISON:  CT 06/21/2021 FINDINGS: Tip of the enteric tube below the diaphragm in the stomach, the side port is in the region of the gastroesophageal junction. Advancement of 2-3 mm would lead to optimal placement. IMPRESSION: Tip of the enteric tube below the diaphragm in the stomach, side port in the region of the gastroesophageal junction. Advancement of 2-3 mm would lead to optimal placement. Electronically Signed   By: Keith Rake M.D.   On: 12/18/2021 23:19   DG Chest Portable 1 View  Result Date: 01/08/2022 CLINICAL DATA:  Endotracheal tube placement. EXAM: PORTABLE CHEST 1 VIEW COMPARISON:  Radiograph 10/16/2021 FINDINGS: Endotracheal tube tip is 4.6 cm from the carina. Enteric tube in place. Upper normal heart size. Aortic atherosclerosis. Chronic hyperinflation and bronchial thickening. Occasional calcified granuloma. No focal airspace disease, pneumothorax or large pleural effusion. IMPRESSION: 1. Endotracheal tube tip 4.6 cm from the carina. Enteric tube in place. 2. Upper normal heart size. 3. Chronic  hyperinflation and bronchial thickening. Electronically Signed   By: Keith Rake M.D.   On:  01/09/2022 23:18    Microbiology Recent Results (from the past 240 hour(s))  Resp Panel by RT-PCR (Flu A&B, Covid) Urine, Catheterized     Status: None   Collection Time: 01/13/2022 11:13 PM   Specimen: Urine, Catheterized; Nasal Swab  Result Value Ref Range Status   SARS Coronavirus 2 by RT PCR NEGATIVE NEGATIVE Final    Comment: (NOTE) SARS-CoV-2 target nucleic acids are NOT DETECTED.  The SARS-CoV-2 RNA is generally detectable in upper respiratory specimens during the acute phase of infection. The lowest concentration of SARS-CoV-2 viral copies this assay can detect is 138 copies/mL. A negative result does not preclude SARS-Cov-2 infection and should not be used as the sole basis for treatment or other patient management decisions. A negative result may occur with  improper specimen collection/handling, submission of specimen other than nasopharyngeal swab, presence of viral mutation(s) within the areas targeted by this assay, and inadequate number of viral copies(<138 copies/mL). A negative result must be combined with clinical observations, patient history, and epidemiological information. The expected result is Negative.  Fact Sheet for Patients:  EntrepreneurPulse.com.au  Fact Sheet for Healthcare Providers:  IncredibleEmployment.be  This test is no t yet approved or cleared by the [place] FDA and  has been authorized for detection and/or diagnosis of SARS-CoV-2 by FDA under an Emergency Use Authorization (EUA). This EUA will remain  in effect (meaning this test can be used) for the duration of the COVID-19 declaration under Section 564(b)(1) of the Act, 21 U.S.C.section 360bbb-3(b)(1), unless the authorization is terminated  or revoked sooner.       Influenza A by PCR NEGATIVE NEGATIVE Final   Influenza B by PCR NEGATIVE NEGATIVE Final    Comment: (NOTE) The Xpert Xpress SARS-CoV-2/FLU/RSV plus assay is intended as an aid in  the diagnosis of influenza from Nasopharyngeal swab specimens and should not be used as a sole basis for treatment. Nasal washings and aspirates are unacceptable for Xpert Xpress SARS-CoV-2/FLU/RSV testing.  Fact Sheet for Patients: EntrepreneurPulse.com.au  Fact Sheet for Healthcare Providers: IncredibleEmployment.be  This test is not yet approved or cleared by the [place] FDA and has been authorized for detection and/or diagnosis of SARS-CoV-2 by FDA under an Emergency Use Authorization (EUA). This EUA will remain in effect (meaning this test can be used) for the duration of the COVID-19 declaration under Section 564(b)(1) of the Act, 21 U.S.C. section 360bbb-3(b)(1), unless the authorization is terminated or revoked.  Performed at [facility], [address], [place], [place] [address]   Urine Culture     Status: None   Collection Time: 01/17/2022 11:40 PM   Specimen: Urine, Catheterized  Result Value Ref Range Status   Specimen Description   Final    URINE, CATHETERIZED Performed at [facility], [address], [place], [place] [address]    Special Requests   Final    NONE Performed at [facility], [address] [place], [place] [address]    Culture   Final    NO GROWTH Performed at [facility], [place] [address], [place], [place] [address]    Report Status 12/24/2021 FINAL  Final  MRSA Next Gen by PCR, Nasal     Status: None   Collection Time: 12/23/21  1:33 AM   Specimen: Nasal Mucosa; Nasal Swab  Result Value Ref Range Status   MRSA by PCR Next Gen  NOT DETECTED NOT DETECTED Final    Comment: (NOTE) The GeneXpert MRSA Assay (FDA approved for NASAL specimens only), is one component of a comprehensive MRSA colonization surveillance program. It is not intended to diagnose MRSA infection nor to guide or monitor treatment for MRSA infections. Test performance is not FDA approved in patients  less than 28 years old. Performed at [facility], [place], [place] [address]   Respiratory (~20 pathogens) panel by PCR     Status: None   Collection Time: 12/24/21  5:17 AM   Specimen: Nasopharyngeal Swab; Respiratory  Result Value Ref Range Status   Adenovirus NOT DETECTED NOT DETECTED Final   Coronavirus 229E NOT DETECTED NOT DETECTED Final    Comment: (NOTE) The Coronavirus on the Respiratory Panel, DOES NOT test for the novel  Coronavirus (2019 nCoV)    Coronavirus HKU1 NOT DETECTED NOT DETECTED Final   Coronavirus NL63 NOT DETECTED NOT DETECTED Final   Coronavirus OC43 NOT DETECTED NOT DETECTED Final   Metapneumovirus NOT DETECTED NOT DETECTED Final   Rhinovirus / Enterovirus NOT DETECTED NOT DETECTED Final   Influenza A NOT DETECTED NOT DETECTED Final   Influenza B NOT DETECTED NOT DETECTED Final   Parainfluenza Virus 1 NOT DETECTED NOT DETECTED Final   Parainfluenza Virus 2 NOT DETECTED NOT DETECTED Final   Parainfluenza Virus 3 NOT DETECTED NOT DETECTED Final   Parainfluenza Virus 4 NOT DETECTED NOT DETECTED Final   Respiratory Syncytial Virus NOT DETECTED NOT DETECTED Final   Bordetella pertussis NOT DETECTED NOT DETECTED Final   Bordetella Parapertussis NOT DETECTED NOT DETECTED Final   Chlamydophila pneumoniae NOT DETECTED NOT DETECTED Final   Mycoplasma pneumoniae NOT DETECTED NOT DETECTED Final    Comment: Performed at [facility], [place]. [address] [place], [place] [address]    Lab Basic Metabolic Panel: Recent Labs  Lab 12/19/2021 2313 12/23/21 0454 12/24/21 0456 01-06-22 0514  NA 141 140 140 139  K 4.0 4.2 3.4* 4.2  CL 88* 89* 91* 93*  CO2 >45* 44* 43* 43*  GLUCOSE 114* 97 128* 130*  BUN 15 16 25* 21*  CREATININE 0.47 0.48 0.52 <0.30*  CALCIUM 9.0 8.7* 8.6* 8.7*  MG 1.9 1.9 2.1 2.0  PHOS 4.6 2.3* 4.0 4.0   Liver Function Tests: Recent Labs  Lab 01/11/2022 2313  AST 20  ALT 14  ALKPHOS 47  BILITOT 0.7  PROT 6.7   ALBUMIN 3.3*   No results for input(s): "LIPASE", "AMYLASE" in the last 168 hours. No results for input(s): "AMMONIA" in the last 168 hours. CBC: Recent Labs  Lab 01/13/2022 2313 12/23/21 0701 12/24/21 0456 01-06-22 0514  WBC 7.0 7.2 9.3 10.1  HGB 9.8* 9.1* 8.7* 8.4*  HCT 34.9* 31.9* 30.1* 29.3*  MCV 105.1* 101.3* 100.3* 101.7*  PLT 206 198 202 186   Cardiac Enzymes: No results for input(s): "CKTOTAL", "CKMB", "CKMBINDEX", "TROPONINI" in the last 168 hours. Sepsis Labs: Recent Labs  Lab 01/06/2022 2313 12/23/21 0238 12/23/21 0701 12/24/21 0456 01-06-2022 0514  PROCALCITON <0.10  --   --  <0.10 <0.10  WBC 7.0  --  7.2 9.3 10.1  LATICACIDVEN  --  1.9  --   --   --       Maretta Bees Essica Kiker 01/06/2022, 1:10 PM

## 2022-01-18 NOTE — Progress Notes (Addendum)
0900 Report received from [facility].0950 Patient extubated to 4 L nasal cannula per verbal order C.Laurann [place] NP. Oxygen saturation dropped rapidly therefore Changed to 100% NRB. 1000 After intense discussion NRB was removed.  Medicated as needed with Fentanyl and  Midazalam. Robinal  .'2mg'$  given per verbal order as well.. Patient has not awakened since intial eye opening with extubation. Respiration regular and remains in NSR as oxygen saturations dropping. 1220 Respirations have stopped. Awaiting cardiac death.1224 Cardiac time of death. Family with patient at time of death. 1400 Body prepared by NT and Charge nurse.

## 2022-01-18 NOTE — Progress Notes (Signed)
NAME:  Misty Green, MRN:  [id number], DOB:  [birth year], LOS: 3 ADMISSION DATE:  01/09/2022   BRIEF SYNOPSIS end-stage COPD and severe aortic stenosis.   History of Present Illness:   patient was recently hospitalized between 10/16/2021 and 10/19/21 for acute on chronic respiratory failure with hypoxia, she was supported with BiPAP & diuresis and discharged home.  She was also previously intubated requiring mechanical ventilatory support for acute on chronic respiratory failure in January 2022. met with Dr. Haroldine Laws with cardiology to evaluate her severe aortic stenosis in the setting of chronic diastolic heart failure.  Per chart review it was determined she is not a candidate for TAVR.  Her husband intimated she was told she only had a " year to live".  This was explained to the patient to be because of her end-stage COPD and severe aortic stenosis.    Significant Hospital Events: Including procedures, antibiotic start and stop dates in addition to other pertinent events   12/29/2021: Admit to ICU with acute on chronic hypoxic & hypercarbic respiratory failure secondary to end-stage COPD requiring emergent intubation and mechanical ventilatory support. 12/6 severe resp failure, 12/6 CVL placed 12/7 remains on vent severe COPD      Interim History / Subjective:  Remains critically ill Prognosis is grave Remains on vent Plan for one way extubation        Objective   Blood pressure (!) 96/52, pulse 63, temperature 98.1 F (36.7 C), resp. rate 18, height 5' (1.524 m), weight 41.1 kg, SpO2 97 %.    Vent Mode: PRVC FiO2 (%):  [40 %] 40 % Set Rate:  [18 bmp] 18 bmp Vt Set:  [380 mL] 380 mL PEEP:  [5 cmH20] 5 cmH20 Plateau Pressure:  [18 [id number] cmH20] 21 cmH20   Intake/Output Summary (Last 24 hours) at 01-11-2022 0718 Last data filed at 2022/01/11 0600 Gross per 24 hour  Intake 2075.1 ml  Output 625 ml  Net 1450.1 ml    Filed Weights   12/23/21 0339 12/24/21 0500   Weight: 41 kg 41.1 kg     REVIEW OF SYSTEMS  PATIENT IS UNABLE TO PROVIDE COMPLETE REVIEW OF SYSTEMS DUE TO SEVERE CRITICAL ILLNESS   PHYSICAL EXAMINATION:  GENERAL:critically ill appearing, +resp distress EYES: Pupils equal, round, reactive to light.  No scleral icterus.  MOUTH: Moist mucosal membrane. INTUBATED NECK: Supple.  PULMONARY: Lungs clear to auscultation, +rhonchi, +wheezing CARDIOVASCULAR: S1 and S2.  Regular rate and rhythm GASTROINTESTINAL: Soft, nontender, -distended. Positive bowel sounds.  MUSCULOSKELETAL: No swelling, clubbing, or edema.  NEUROLOGIC: obtunded,sedated SKIN:normal, warm to touch, Capillary refill delayed  Pulses present bilaterally  Labs/imaging that I havepersonally reviewed  (right click and "Reselect all SmartList Selections" daily)     ASSESSMENT AND PLAN SYNOPSIS  52 yo white female with end stage COPD with severe aortic stenosis, patient with acute cardiac failure and acute COPD exacerbation leading to intubation  Severe ACUTE Hypoxic and Hypercapnic Respiratory Failure -continue Mechanical Ventilator support -Wean Fio2 and PEEP as tolerated -VAP/VENT bundle implementation - Wean PEEP & FiO2 as tolerated, maintain SpO2 > 88% - Head of bed elevated 30 degrees, VAP protocol in place - Plateau pressures less than 30 cm H20  - Intermittent chest x-ray & ABG PRN - Ensure adequate pulmonary hygiene  Failure to wean from vent Plan for one way extubation  Vent Mode: PRVC FiO2 (%):  [40 %] 40 % Set Rate:  [18 bmp] 18 bmp Vt Set:  [380 mL] 380 mL PEEP:  [  [place] Pressure:  [18 BBC48-88 cmH20] 21 cmH20  SEVERE COPD EXACERBATION -continue IV steroids as prescribed -continue NEB THERAPY as prescribed   ACUTE  CARDIAC FAILURE- MOD/SEVERE AS -oxygen as needed -Lasix as tolerated -follow up cardiac enzymes as indicated -follow up cardiac enzymes as indicated Follow up with cardiology evaluation   CARDIAC ICU  monitoring  RENAL -continue Foley Catheter-assess need -Avoid nephrotoxic agents -Follow urine output, BMP -Ensure adequate renal perfusion, optimize oxygenation -Renal dose medications   Intake/Output Summary (Last 24 hours) at Dec 30, 2021 0720 Last data filed at 2021/12/30 0600 Gross per 24 hour  Intake 2075.1 ml  Output 625 ml  Net 1450.1 ml     NEUROLOGY ACUTE METABOLIC ENCEPHALOPATHY -need for sedation   INFECTIOUS DISEASE -follow up cultures   ENDO - ICU hypoglycemic\Hyperglycemia protocol -check FSBS per protocol   GI GI PROPHYLAXIS as indicated  NUTRITIONAL STATUS DIET-->TF's as tolerated Constipation protocol as indicated   ELECTROLYTES -follow labs as needed -replace as needed -pharmacy consultation and following  Best Practice (right click and "Reselect all SmartList Selections" daily)  Diet/type: NPO w/ meds via tube DVT prophylaxis: prophylactic heparin  GI prophylaxis: H2B Lines: N/A Foley:  Yes, and it is still needed Code Status:  DNR/DNI   Labs   CBC: Recent Labs  Lab 12/24/2021 2313 12/23/21 0701 12/24/21 0456 12-30-21 0514  WBC 7.0 7.2 9.3 10.1  HGB 9.8* 9.1* 8.7* 8.4*  HCT 34.9* 31.9* 30.1* 29.3*  MCV 105.1* 101.3* 100.3* 101.7*  PLT 206 198 202 186     Basic Metabolic Panel: Recent Labs  Lab 01/06/2022 2313 12/23/21 0454 12/24/21 0456 12/30/21 0514  NA 141 140 140 139  K 4.0 4.2 3.4* 4.2  CL 88* 89* 91* 93*  CO2 >45* 44* 43* 43*  GLUCOSE 114* 97 128* 130*  BUN 15 16 25* 21*  CREATININE 0.47 0.48 0.52 <0.30*  CALCIUM 9.0 8.7* 8.6* 8.7*  MG 1.9 1.9 2.1 2.0  PHOS 4.6 2.3* 4.0 4.0    GFR: CrCl cannot be calculated (This lab value cannot be used to calculate CrCl because it is not a number: <0.30). Recent Labs  Lab 12/31/2021 2313 12/23/21 0238 12/23/21 0701 12/24/21 0456 12/30/21 0514  PROCALCITON <0.10  --   --  <0.10  --   WBC 7.0  --  7.2 9.3 10.1  LATICACIDVEN  --  1.9  --   --   --      Liver  Function Tests: Recent Labs  Lab 12/27/2021 2313  AST 20  ALT 14  ALKPHOS 47  BILITOT 0.7  PROT 6.7  ALBUMIN 3.3*    No results for input(s): "LIPASE", "AMYLASE" in the last 168 hours. No results for input(s): "AMMONIA" in the last 168 hours.  ABG    Component Value Date/Time   PHART 7.34 (L) 12/23/2021 2357   PCO2ART 103 (HH) 12/28/2021 2357   PO2ART 33 (LL) 12/18/2021 2357   HCO3 52.6 (H) 12/24/2021 0456   TCO2 >50 (H) 07/09/2019 0528   ACIDBASEDEF 1.1 05/20/2018 1240   O2SAT 76.2 12/24/2021 0456    CBG: Recent Labs  Lab 12/24/21 1124 12/24/21 1630 12/24/21 1918 12/24/21 2326 2021-12-30 0331  GLUCAP 130* 136* 136* 149* 132*     Allergies Allergies  Allergen Reactions   Ativan [Lorazepam] Other (See Comments)    D/w with dter Aniceto Boss pt gets very combative and agitated. Has tolerated Versed well   Other Nausea And Vomiting    Anti-depressent that starts  with t       DVT/GI PRX  assessed I Assessed the need for Labs I Assessed the need for Foley I Assessed the need for Central Venous Line Family Discussion when available I Assessed the need for Mobilization I made an Assessment of medications to be adjusted accordingly Safety Risk assessment completed  CASE DISCUSSED IN MULTIDISCIPLINARY ROUNDS WITH ICU TEAM   PATIENT [facility] ICU LOS    Critical Care Time devoted to patient care services described in this note is 55 minutes.  Critical care was necessary to treat /prevent imminent and life-threatening deterioration. Overall, patient is critically ill, prognosis is guarded.  Patient with Multiorgan failure and at high risk for cardiac arrest and death.    Corrin Parker, M.D.  [facility]  Medical Director [place] Director [facility] Cardio-Pulmonary Department

## 2022-01-18 NOTE — Plan of Care (Signed)
Patient is extubated

## 2022-01-18 DEATH — deceased
